# Patient Record
Sex: Female | Born: 1945 | Race: White | Hispanic: No | Smoking: Never smoker
Health system: Southern US, Community
[De-identification: ages and names within clinical notes are randomized; demographics above are authoritative.]

## PROBLEM LIST (undated history)

## (undated) DIAGNOSIS — K589 Irritable bowel syndrome without diarrhea: Secondary | ICD-10-CM

## (undated) DIAGNOSIS — E7800 Pure hypercholesterolemia, unspecified: Secondary | ICD-10-CM

## (undated) DIAGNOSIS — E119 Type 2 diabetes mellitus without complications: Secondary | ICD-10-CM

## (undated) DIAGNOSIS — G629 Polyneuropathy, unspecified: Secondary | ICD-10-CM

## (undated) DIAGNOSIS — D510 Vitamin B12 deficiency anemia due to intrinsic factor deficiency: Secondary | ICD-10-CM

## (undated) DIAGNOSIS — I10 Essential (primary) hypertension: Secondary | ICD-10-CM

## (undated) DIAGNOSIS — G5603 Carpal tunnel syndrome, bilateral upper limbs: Secondary | ICD-10-CM

## (undated) DIAGNOSIS — D649 Anemia, unspecified: Secondary | ICD-10-CM

## (undated) DIAGNOSIS — R0600 Dyspnea, unspecified: Secondary | ICD-10-CM

## (undated) DIAGNOSIS — K219 Gastro-esophageal reflux disease without esophagitis: Secondary | ICD-10-CM

## (undated) DIAGNOSIS — R42 Dizziness and giddiness: Secondary | ICD-10-CM

## (undated) DIAGNOSIS — M1990 Unspecified osteoarthritis, unspecified site: Secondary | ICD-10-CM

## (undated) DIAGNOSIS — E538 Deficiency of other specified B group vitamins: Secondary | ICD-10-CM

## (undated) DIAGNOSIS — K2970 Gastritis, unspecified, without bleeding: Secondary | ICD-10-CM

## (undated) HISTORY — DX: Essential (primary) hypertension: I10

## (undated) HISTORY — DX: Deficiency of other specified B group vitamins: E53.8

## (undated) HISTORY — DX: Type 2 diabetes mellitus without complications: E11.9

## (undated) HISTORY — DX: Pure hypercholesterolemia, unspecified: E78.00

## (undated) HISTORY — DX: Irritable bowel syndrome, unspecified: K58.9

## (undated) HISTORY — DX: Gastritis, unspecified, without bleeding: K29.70

## (undated) HISTORY — DX: Vitamin B12 deficiency anemia due to intrinsic factor deficiency: D51.0

## (undated) HISTORY — DX: Anemia, unspecified: D64.9

## (undated) HISTORY — DX: Unspecified osteoarthritis, unspecified site: M19.90

## (undated) HISTORY — DX: Gastro-esophageal reflux disease without esophagitis: K21.9

---

## 1978-04-08 HISTORY — PX: BREAST BIOPSY: SHX20

## 1978-04-08 HISTORY — PX: BREAST EXCISIONAL BIOPSY: SUR124

## 1978-04-08 HISTORY — PX: BREAST SURGERY: SHX581

## 1981-04-08 HISTORY — PX: CHOLECYSTECTOMY: SHX55

## 2004-03-06 ENCOUNTER — Ambulatory Visit: Payer: Self-pay | Admitting: Internal Medicine

## 2004-07-16 ENCOUNTER — Ambulatory Visit: Payer: Self-pay | Admitting: Internal Medicine

## 2004-08-21 ENCOUNTER — Ambulatory Visit: Payer: Self-pay | Admitting: Internal Medicine

## 2004-11-30 ENCOUNTER — Ambulatory Visit: Payer: Self-pay | Admitting: Internal Medicine

## 2007-03-31 ENCOUNTER — Observation Stay: Payer: Self-pay | Admitting: Internal Medicine

## 2007-03-31 ENCOUNTER — Other Ambulatory Visit: Payer: Self-pay

## 2007-11-29 ENCOUNTER — Ambulatory Visit: Payer: Self-pay | Admitting: Internal Medicine

## 2008-06-03 ENCOUNTER — Ambulatory Visit: Payer: Self-pay | Admitting: Internal Medicine

## 2008-09-23 ENCOUNTER — Ambulatory Visit: Payer: Self-pay | Admitting: Internal Medicine

## 2008-12-27 ENCOUNTER — Ambulatory Visit: Payer: Self-pay | Admitting: Internal Medicine

## 2009-07-22 ENCOUNTER — Ambulatory Visit: Payer: Self-pay | Admitting: Internal Medicine

## 2011-06-03 ENCOUNTER — Ambulatory Visit: Payer: Self-pay | Admitting: Internal Medicine

## 2011-06-06 ENCOUNTER — Emergency Department: Payer: Self-pay | Admitting: Emergency Medicine

## 2011-11-29 ENCOUNTER — Ambulatory Visit: Payer: Self-pay | Admitting: Gastroenterology

## 2012-02-17 ENCOUNTER — Ambulatory Visit (INDEPENDENT_AMBULATORY_CARE_PROVIDER_SITE_OTHER): Payer: Medicare Other | Admitting: Internal Medicine

## 2012-02-17 ENCOUNTER — Encounter: Payer: Self-pay | Admitting: Internal Medicine

## 2012-02-17 VITALS — BP 132/68 | HR 63 | Temp 98.7°F | Ht 62.0 in | Wt 231.8 lb

## 2012-02-17 DIAGNOSIS — E119 Type 2 diabetes mellitus without complications: Secondary | ICD-10-CM

## 2012-02-17 DIAGNOSIS — K589 Irritable bowel syndrome without diarrhea: Secondary | ICD-10-CM | POA: Insufficient documentation

## 2012-02-17 DIAGNOSIS — E785 Hyperlipidemia, unspecified: Secondary | ICD-10-CM | POA: Insufficient documentation

## 2012-02-17 DIAGNOSIS — E7800 Pure hypercholesterolemia, unspecified: Secondary | ICD-10-CM

## 2012-02-17 DIAGNOSIS — R51 Headache: Secondary | ICD-10-CM

## 2012-02-17 DIAGNOSIS — E538 Deficiency of other specified B group vitamins: Secondary | ICD-10-CM | POA: Insufficient documentation

## 2012-02-17 DIAGNOSIS — I10 Essential (primary) hypertension: Secondary | ICD-10-CM | POA: Insufficient documentation

## 2012-02-17 HISTORY — DX: Type 2 diabetes mellitus without complications: E11.9

## 2012-02-17 MED ORDER — PIOGLITAZONE HCL 30 MG PO TABS: 30.0000 mg | ORAL_TABLET | Freq: Every day | ORAL | Status: DC

## 2012-02-17 NOTE — Assessment & Plan Note (Signed)
Low cholesterol diet.  On simvastatin.  Check lipid panel and liver function with the next fasting labs.

## 2012-02-17 NOTE — Assessment & Plan Note (Signed)
Sugars as outlined.  Restart Actos.  Continue her insulin as outlined.  Needs to take her medications regularly.  Check met b and a1c.

## 2012-02-17 NOTE — Progress Notes (Signed)
  Subjective:    Patient ID: Beth Tyler, female    DOB: Jan 22, 1946, 66 y.o.   MRN: [id number]  HPI 66 year old female with past history of diabetes, hypertension and hypercholesterolemia who comes in today for a scheduled follow up.  States she is doing relatively well.  Sugars in the am averaging 100-120 and PM sugars 180.  Before bed - sugar - 200.  Out of her actos x 3 months.  She was also out of her Lantus for 6 weeks.  She has been back on Lantus for 3 weeks.  Working three jobs now.  Reports no increased stress.  States she enjoys her job.  Trying to watch what she eats.  Breathing stable.  Bowels unchanged.    Past Medical History  Diagnosis Date  . Diabetes mellitus, type II   . Hypertension   . Hypercholesterolemia   . GERD (gastroesophageal reflux disease)   . IBS (irritable bowel syndrome)   . Gastritis   . B12 deficiency   . Arthritis     Review of Systems Patient denies any headache, lightheadedness or dizziness.  No chest pain, tightness or palpitations.  No increased shortness of breath, cough or congestion.  No nausea or vomiting.  No abdominal pain or cramping.  No bowel change.  States her bowels are stable.  Just had her colonoscopy.  States everything looked good.  Recommended follow up is 10 years.  No urine change.        Objective:   Physical Exam Filed Vitals:   02/17/12 1413  BP: 132/68  Pulse: 63  Temp: 98.7 F (77.72 C)   66 year old female in no acute distress.   HEENT:  Nares - clear.  OP- without lesions or erythema.  NECK:  Supple, nontender.  No audible bruit.   HEART:  Appears to be regular. LUNGS:  Without crackles or wheezing audible.  Respirations even and unlabored.   RADIAL PULSE:  Equal bilaterally.  ABDOMEN:  Soft, nontender.  No audible abdominal bruit.   EXTREMITIES:  No increased edema to be present.  Lesion between  Her first and second toe - c/w yeast.  No increased erythema.                     Assessment & Plan:  TOE LESION.   Changes c/w fungus.  Treat with Lotrimin bid as directed.  Notify me if it worsens or does not resolve.    CARDIOVASCULAR.  Stress test 08/06/10 negative for ischemia.  Normal heart function.  Currently asymptomatic.    HEALTH MAINTENANCE.  Physical 08/13/10.  States she can only have a physical every two years.  Mammogram 06/03/11 - BiRADS I.  She is going to find out if we can schedule her mammogram. Will call and let me know if I can schedule.  Got her flu shot.

## 2012-02-17 NOTE — Assessment & Plan Note (Signed)
Blood pressure as outlined.  Same meds.  Check met b with next labs.

## 2012-02-17 NOTE — Patient Instructions (Addendum)
It was good seeing you today.  I want you to use the Lotrimin cream twice a day (between your toes).  Let me know if the area does not resolve.  We will schedule your follow up labs.  Schedule a B12 injection at the end of the month

## 2012-02-17 NOTE — Assessment & Plan Note (Signed)
Bowels stable.  Just had colonoscopy.  States everything checked out fine.  Recommended follow up in 10 years.

## 2012-02-17 NOTE — Assessment & Plan Note (Signed)
Continue B12 injections.  Due at the end of this month.

## 2012-02-21 ENCOUNTER — Other Ambulatory Visit (INDEPENDENT_AMBULATORY_CARE_PROVIDER_SITE_OTHER): Payer: Medicare Other

## 2012-02-21 DIAGNOSIS — R51 Headache: Secondary | ICD-10-CM

## 2012-02-21 DIAGNOSIS — E7800 Pure hypercholesterolemia, unspecified: Secondary | ICD-10-CM

## 2012-02-21 DIAGNOSIS — E119 Type 2 diabetes mellitus without complications: Secondary | ICD-10-CM

## 2012-02-21 LAB — BASIC METABOLIC PANEL
CO2: 28 mEq/L (ref 19–32)
Calcium: 9.2 mg/dL (ref 8.4–10.5)
Creatinine, Ser: 0.9 mg/dL (ref 0.4–1.2)
GFR: 67.16 mL/min (ref >60.00)
Sodium: 140 mEq/L (ref 135–145)

## 2012-02-21 LAB — LIPID PANEL
Cholesterol: 148 mg/dL (ref 0–200)
Total CHOL/HDL Ratio: 4

## 2012-02-21 LAB — HEMOGLOBIN A1C: Hgb A1c MFr Bld: 9.8 % — ABNORMAL HIGH (ref 4.6–6.5)

## 2012-02-21 LAB — HEPATIC FUNCTION PANEL
ALT: 32 U/L (ref 0–35)
AST: 26 U/L (ref 0–37)
Total Bilirubin: 0.7 mg/dL (ref 0.3–1.2)

## 2012-02-21 LAB — LDL CHOLESTEROL, DIRECT: Direct LDL: 74.4 mg/dL

## 2012-02-21 LAB — SEDIMENTATION RATE: Sed Rate: 14 mm/hr (ref 0–22)

## 2012-02-24 ENCOUNTER — Telehealth: Payer: Self-pay | Admitting: Internal Medicine

## 2012-02-24 NOTE — Telephone Encounter (Signed)
I sent Ms Figgers a My Chart message.  She needs an appt with me within the next 2-3 weeks - to follow up on her sugars.  Thanks.  Please call her with an appt.

## 2012-02-24 NOTE — Telephone Encounter (Signed)
Appointment 12/6/@ 9 pt aware of appointment

## 2012-02-26 ENCOUNTER — Telehealth: Payer: Self-pay | Admitting: Internal Medicine

## 2012-02-26 NOTE — Telephone Encounter (Signed)
[facility] Gly-buride mcr 6mg  tab ptrd qty 180 Sig  Take one tablet by mouth twice daily

## 2012-03-03 MED ORDER — GLYBURIDE MICRONIZED 6 MG PO TABS: 6.0000 mg | ORAL_TABLET | Freq: Two times a day (BID) | ORAL | Status: DC

## 2012-03-03 NOTE — Telephone Encounter (Signed)
Sent in to pharmacy.  

## 2012-03-08 ENCOUNTER — Telehealth: Payer: Self-pay | Admitting: Internal Medicine

## 2012-03-08 MED ORDER — METFORMIN HCL 1000 MG PO TABS: 1000.0000 mg | ORAL_TABLET | Freq: Two times a day (BID) | ORAL | Status: DC

## 2012-03-08 MED ORDER — HYDROCHLOROTHIAZIDE 25 MG PO TABS: 25.0000 mg | ORAL_TABLET | Freq: Every day | ORAL | Status: DC

## 2012-03-08 MED ORDER — LISINOPRIL 20 MG PO TABS: ORAL_TABLET | ORAL | Status: DC

## 2012-03-08 NOTE — Telephone Encounter (Signed)
rx sent in for metformin 1000 mg bid, lisinopril 20mg  (2 qam and one q pm) and hctz

## 2012-03-10 ENCOUNTER — Encounter: Payer: Self-pay | Admitting: Internal Medicine

## 2012-03-13 ENCOUNTER — Encounter: Payer: Self-pay | Admitting: Internal Medicine

## 2012-03-13 ENCOUNTER — Ambulatory Visit (INDEPENDENT_AMBULATORY_CARE_PROVIDER_SITE_OTHER): Payer: Medicare Other | Admitting: Internal Medicine

## 2012-03-13 VITALS — BP 122/78 | HR 69 | Temp 98.3°F | Ht 62.0 in | Wt 231.5 lb

## 2012-03-13 DIAGNOSIS — K589 Irritable bowel syndrome without diarrhea: Secondary | ICD-10-CM

## 2012-03-13 DIAGNOSIS — E538 Deficiency of other specified B group vitamins: Secondary | ICD-10-CM

## 2012-03-13 DIAGNOSIS — E7800 Pure hypercholesterolemia, unspecified: Secondary | ICD-10-CM

## 2012-03-13 DIAGNOSIS — I10 Essential (primary) hypertension: Secondary | ICD-10-CM

## 2012-03-13 DIAGNOSIS — E119 Type 2 diabetes mellitus without complications: Secondary | ICD-10-CM

## 2012-03-13 MED ORDER — CYANOCOBALAMIN 1000 MCG/ML IJ SOLN
1000.0000 ug | Freq: Once | INTRAMUSCULAR | Status: AC
  Administered 2012-03-13: 1000 ug via INTRAMUSCULAR

## 2012-03-13 NOTE — Progress Notes (Signed)
  Subjective:    Patient ID: Beth Tyler, female    DOB: 1945-11-24, 66 y.o.   MRN: [id number]  HPI 66 year old female with past history of diabetes, hypertension and hypercholesterolemia who comes in today for a scheduled follow up.  Declines physical today.  States she is doing relatively well.  Sugars in the am averaging 90-120 and PM sugars 180-230.  Reports no increased stress.  States she enjoys her job.  Trying to watch what she eats.  Not able to eat regular meals at regular times - secondary to her work scheduled.  Breathing stable.  Bowels unchanged.  She takes her [name] at lunch.    Past Medical History  Diagnosis Date  . Diabetes mellitus, type II   . Hypertension   . Hypercholesterolemia   . GERD (gastroesophageal reflux disease)   . IBS (irritable bowel syndrome)   . Gastritis   . B12 deficiency   . Arthritis   . Diabetes mellitus 02/17/2012  . Anemia   . Pernicious anemia     Review of Systems Patient denies any headache, lightheadedness or dizziness.  No significant sinus or allergy symptoms.  No chest pain, tightness or palpitations.  No increased shortness of breath, cough or congestion.  No nausea or vomiting.  No abdominal pain or cramping.  No bowel change.  States her bowels are stable.  No urine change.        Objective:   Physical Exam  Filed Vitals:   03/13/12 0901  BP: 122/78  Pulse: 69  Temp: 98.3 F (18.53 C)   66 year old female in no acute distress.   HEENT:  Nares - clear.  OP- without lesions or erythema.  NECK:  Supple, nontender.  No audible bruit.   HEART:  Appears to be regular. LUNGS:  Without crackles or wheezing audible.  Respirations even and unlabored.   RADIAL PULSE:  Equal bilaterally.  ABDOMEN:  Soft, nontender.  No audible abdominal bruit.   EXTREMITIES:  No increased edema to be present.                     Assessment & Plan:  TOE LESION.  Previous changes c/w fungus.  Treated with Lotrimin bid as directed.  Resolved.     CARDIOVASCULAR.  Stress test 08/06/10 negative for ischemia.  Normal heart function.  Currently asymptomatic.    HEALTH MAINTENANCE.  Physical 08/13/10.  States she can only have a physical every two years.  Mammogram 06/03/11 - BiRADS I.  She declines physical today.

## 2012-03-15 ENCOUNTER — Encounter: Payer: Self-pay | Admitting: Internal Medicine

## 2012-03-15 NOTE — Assessment & Plan Note (Signed)
On simvastatin.  Low cholesterol diet and exercise.  Check lipid panel and liver function.  

## 2012-03-15 NOTE — Assessment & Plan Note (Signed)
Symptoms stable.  Colonoscopy 2/03 normal colon.  EGD revealed gastritis with normal esophagus.  Follow.

## 2012-03-15 NOTE — Assessment & Plan Note (Signed)
Continue B12 injections.   

## 2012-03-15 NOTE — Assessment & Plan Note (Signed)
Blood pressure under good control.  Same meds.  Follow metabolic panel.   

## 2012-03-15 NOTE — Assessment & Plan Note (Signed)
Sugars as outlined.  Have improved since restarting Actos and Lantus.  Last a1c 9.8.  Restarted Actos and Lantus after this lab check.  We discussed at length today regarding eating regular meals and not going long periods without eating.  Will monitor sugars and record.  Will need to adjust medication pending sugar readings.  May need to adjust [name] dosing.

## 2012-04-09 ENCOUNTER — Ambulatory Visit (INDEPENDENT_AMBULATORY_CARE_PROVIDER_SITE_OTHER): Payer: Medicare Other | Admitting: Internal Medicine

## 2012-04-09 DIAGNOSIS — E538 Deficiency of other specified B group vitamins: Secondary | ICD-10-CM

## 2012-04-09 MED ORDER — CYANOCOBALAMIN 1000 MCG/ML IJ SOLN
1000.0000 ug | Freq: Once | INTRAMUSCULAR | Status: AC
Start: 2012-04-09 — End: 2012-04-09
  Administered 2012-04-09: 1000 ug via INTRAMUSCULAR

## 2012-04-15 ENCOUNTER — Other Ambulatory Visit: Payer: Self-pay | Admitting: *Deleted

## 2012-04-15 MED ORDER — LISINOPRIL 20 MG PO TABS: ORAL_TABLET | ORAL | Status: DC

## 2012-04-15 MED ORDER — SIMVASTATIN 10 MG PO TABS: 10.0000 mg | ORAL_TABLET | Freq: Every day | ORAL | Status: DC

## 2012-04-15 NOTE — Telephone Encounter (Signed)
Sent in to pharmacy.  

## 2012-05-21 ENCOUNTER — Telehealth: Payer: Self-pay | Admitting: Internal Medicine

## 2012-05-21 DIAGNOSIS — K589 Irritable bowel syndrome without diarrhea: Secondary | ICD-10-CM

## 2012-05-21 DIAGNOSIS — I10 Essential (primary) hypertension: Secondary | ICD-10-CM

## 2012-05-21 DIAGNOSIS — E119 Type 2 diabetes mellitus without complications: Secondary | ICD-10-CM

## 2012-05-21 DIAGNOSIS — D649 Anemia, unspecified: Secondary | ICD-10-CM

## 2012-05-21 DIAGNOSIS — E7800 Pure hypercholesterolemia, unspecified: Secondary | ICD-10-CM

## 2012-05-21 NOTE — Telephone Encounter (Signed)
When I called pt. To r/s appt from 2-14 with Dr. Lorin Picket, she asked could she come in a week before her appointment and have lab work prior to visit.  Please let me know and we can schedule lab appointment. Also she is due for B12 injection, i advised patient that it was on back order and we can call her once we have it.

## 2012-05-21 NOTE — Telephone Encounter (Signed)
Order placed for labs.  Please schedule labs 1-2 days before her 06/22/12 appt.  Thanks.

## 2012-05-22 ENCOUNTER — Ambulatory Visit: Payer: Medicare Other | Admitting: Internal Medicine

## 2012-05-25 NOTE — Telephone Encounter (Signed)
Left message for pt to call office.  Please schedule her labs 1-- days prior to appointment 3/17

## 2012-05-26 ENCOUNTER — Other Ambulatory Visit: Payer: Self-pay | Admitting: *Deleted

## 2012-05-26 MED ORDER — AMLODIPINE BESYLATE 10 MG PO TABS: 10.0000 mg | ORAL_TABLET | Freq: Every day | ORAL | Status: DC

## 2012-05-26 NOTE — Telephone Encounter (Signed)
Sent in to pharmacy.  

## 2012-06-01 NOTE — Telephone Encounter (Signed)
Lab appointment 3/14  Pt aware of appointment

## 2012-06-03 ENCOUNTER — Other Ambulatory Visit: Payer: Self-pay | Admitting: *Deleted

## 2012-06-04 MED ORDER — OMEPRAZOLE 20 MG PO CPDR: 20.0000 mg | DELAYED_RELEASE_CAPSULE | Freq: Two times a day (BID) | ORAL | Status: DC

## 2012-06-04 NOTE — Telephone Encounter (Signed)
Sent in to pharmacy.  

## 2012-06-05 ENCOUNTER — Telehealth: Payer: Self-pay | Admitting: Internal Medicine

## 2012-06-05 DIAGNOSIS — Z1239 Encounter for other screening for malignant neoplasm of breast: Secondary | ICD-10-CM

## 2012-06-05 NOTE — Telephone Encounter (Signed)
I received a request from the patient to schedule her mammogram.  Order placed.  She request march or April appt.

## 2012-06-19 ENCOUNTER — Other Ambulatory Visit (INDEPENDENT_AMBULATORY_CARE_PROVIDER_SITE_OTHER): Payer: Medicare Other

## 2012-06-19 DIAGNOSIS — D649 Anemia, unspecified: Secondary | ICD-10-CM

## 2012-06-19 DIAGNOSIS — E119 Type 2 diabetes mellitus without complications: Secondary | ICD-10-CM

## 2012-06-19 DIAGNOSIS — E7800 Pure hypercholesterolemia, unspecified: Secondary | ICD-10-CM

## 2012-06-19 LAB — CBC WITH DIFFERENTIAL/PLATELET
Basophils Absolute: 0 10*3/uL (ref 0.0–0.1)
Eosinophils Absolute: 0.5 10*3/uL (ref 0.0–0.7)
HCT: 36.6 % (ref 36.0–46.0)
Hemoglobin: 12.3 g/dL (ref 12.0–15.0)
Lymphs Abs: 2.7 10*3/uL (ref 0.7–4.0)
MCHC: 33.5 g/dL (ref 30.0–36.0)
Neutro Abs: 4.9 10*3/uL (ref 1.4–7.7)
Platelets: 229 10*3/uL (ref 150.0–400.0)
RDW: 13.9 % (ref 11.5–14.6)

## 2012-06-19 LAB — LIPID PANEL: Cholesterol: 110 mg/dL (ref 0–200)

## 2012-06-19 LAB — BASIC METABOLIC PANEL
BUN: 20 mg/dL (ref 6–23)
CO2: 27 mEq/L (ref 19–32)
Calcium: 9.2 mg/dL (ref 8.4–10.5)
GFR: 61.48 mL/min (ref >60.00)
Glucose, Bld: 75 mg/dL (ref 70–99)
Potassium: 5.1 mEq/L (ref 3.5–5.1)
Sodium: 140 mEq/L (ref 135–145)

## 2012-06-19 LAB — HEPATIC FUNCTION PANEL
ALT: 25 U/L (ref 0–35)
AST: 19 U/L (ref 0–37)
Alkaline Phosphatase: 64 U/L (ref 39–117)
Bilirubin, Direct: 0.1 mg/dL (ref 0.0–0.3)
Total Bilirubin: 0.5 mg/dL (ref 0.3–1.2)
Total Protein: 7.2 g/dL (ref 6.0–8.3)

## 2012-06-22 ENCOUNTER — Ambulatory Visit (INDEPENDENT_AMBULATORY_CARE_PROVIDER_SITE_OTHER): Payer: Medicare Other | Admitting: Internal Medicine

## 2012-06-22 ENCOUNTER — Encounter: Payer: Self-pay | Admitting: Internal Medicine

## 2012-06-22 VITALS — BP 120/60 | HR 65 | Temp 97.8°F | Ht 62.0 in | Wt 230.5 lb

## 2012-06-22 DIAGNOSIS — E538 Deficiency of other specified B group vitamins: Secondary | ICD-10-CM

## 2012-06-22 DIAGNOSIS — E119 Type 2 diabetes mellitus without complications: Secondary | ICD-10-CM

## 2012-06-22 DIAGNOSIS — E7800 Pure hypercholesterolemia, unspecified: Secondary | ICD-10-CM

## 2012-06-22 DIAGNOSIS — K589 Irritable bowel syndrome without diarrhea: Secondary | ICD-10-CM

## 2012-06-22 DIAGNOSIS — I10 Essential (primary) hypertension: Secondary | ICD-10-CM

## 2012-06-28 ENCOUNTER — Encounter: Payer: Self-pay | Admitting: Internal Medicine

## 2012-06-28 NOTE — Assessment & Plan Note (Signed)
Sugars as outlined.  Have improved since restarting Actos and Lantus.  Last a1c 7.6 down from 9.8.  Restarted Actos and Lantus after this lab check.  We discussed at length today regarding eating regular meals and not going long periods without eating.  Will monitor sugars and record.  Will need to adjust medication pending sugar readings.  May need to adjust [name] dosing.  Send in readings.  Brought in no readings today.  Follow.

## 2012-06-28 NOTE — Assessment & Plan Note (Addendum)
Bowels stable.  Just had colonoscopy.  States everything checked out fine.  Recommended follow up in 10 years.  Last colonoscopy 11/29/11.

## 2012-06-28 NOTE — Assessment & Plan Note (Signed)
Blood pressure under good control.  Same meds.  Follow metabolic panel.   

## 2012-06-28 NOTE — Assessment & Plan Note (Signed)
Continue B12 injections.   

## 2012-06-28 NOTE — Progress Notes (Signed)
Subjective:    Patient ID: Beth Tyler, female    DOB: [birth year], 67 y.o.   MRN: [id number]  HPI 67 year old female with past history of diabetes, hypertension and hypercholesterolemia who comes in today for a scheduled follow up.  States she is doing relatively well.  Brought in no recorded sugar readings.  Sugars in the am averaging 90-110.  Higher in the evening.   Reports no increased stress.  States she enjoys her job.  Trying to watch what she eats.  Not able to eat regular meals at regular times - secondary to her work schedule.  Breathing stable.  Bowels unchanged.  She takes her [name] at lunch.  Discussed the importance of eating regular meals.  No problem with lows.   Past Medical History  Diagnosis Date  . Diabetes mellitus, type II   . Hypertension   . Hypercholesterolemia   . GERD (gastroesophageal reflux disease)   . IBS (irritable bowel syndrome)   . Gastritis   . B12 deficiency   . Arthritis   . Diabetes mellitus 02/17/2012  . Anemia   . Pernicious anemia     Current Outpatient Prescriptions on File Prior to Visit  Medication Sig Dispense Refill  . amLODipine (NORVASC) 10 MG tablet Take 1 tablet (10 mg total) by mouth daily.  30 tablet  5  . aspirin 81 MG tablet Take 81 mg by mouth daily.      Marland Kitchen atenolol (TENORMIN) 100 MG tablet 100 mg. Take one tablet qam and 1/2 tablet qpm      . cyanocobalamin (,VITAMIN B-12,) 1000 MCG/ML injection Inject 1,000 mcg into the muscle every 30 (thirty) days.      Marland Kitchen glyBURIDE micronized (GLYNASE) 6 MG tablet Take 1 tablet (6 mg total) by mouth 2 (two) times daily before a meal.  60 tablet  1  . hydrochlorothiazide (HYDRODIURIL) 25 MG tablet Take 1 tablet (25 mg total) by mouth daily.  30 tablet  5  . insulin glargine (LANTUS) 100 UNIT/ML injection Inject 16 Units into the skin daily.      . insulin lispro ([name]) 100 UNIT/ML injection Inject 10 Units into the skin daily. Take 10 units before lunch      . lisinopril  (PRINIVIL,ZESTRIL) 20 MG tablet Take one tablet in the am and two tablets in the pm  270 tablet  0  . metFORMIN (GLUCOPHAGE) 1000 MG tablet Take 1 tablet (1,000 mg total) by mouth 2 (two) times daily with a meal.  180 tablet  3  . omeprazole (PRILOSEC) 20 MG capsule Take 1 capsule (20 mg total) by mouth 2 (two) times daily.  60 capsule  5  . pioglitazone (ACTOS) 30 MG tablet Take 1 tablet (30 mg total) by mouth daily.  30 tablet  6  . simvastatin (ZOCOR) 10 MG tablet Take 1 tablet (10 mg total) by mouth at bedtime.  90 tablet  0   No current facility-administered medications on file prior to visit.    Review of Systems Patient denies any headache, lightheadedness or dizziness.  No significant sinus or allergy symptoms.  No chest pain, tightness or palpitations.  No increased shortness of breath, cough or congestion.  No nausea or vomiting.  No abdominal pain or cramping.  No bowel change.  States her bowels are stable.  No urine change.        Objective:   Physical Exam  Filed Vitals:   06/22/12 1506  BP: 120/60  Pulse: 65  Temp:  97.8 F (16.38 C)   67 year old female in no acute distress.   HEENT:  Nares - clear.  OP- without lesions or erythema.  NECK:  Supple, nontender.  No audible bruit.   HEART:  Appears to be regular. LUNGS:  Without crackles or wheezing audible.  Respirations even and unlabored.   RADIAL PULSE:  Equal bilaterally.  ABDOMEN:  Soft, nontender.  No audible abdominal bruit.   EXTREMITIES:  No increased edema to be present.                     Assessment & Plan:  CARDIOVASCULAR.  Stress test 08/06/10 negative for ischemia.  Normal heart function.  Currently asymptomatic.    HEALTH MAINTENANCE.  Physical 08/13/10.  States she can only have a physical every two years.  Mammogram 06/03/11 - BiRADS I.  Need to schedule mammogram.  Schedule physical next visit.  08/10/10 pap negative.

## 2012-06-28 NOTE — Assessment & Plan Note (Signed)
On simvastatin.  Low cholesterol diet and exercise.  Follow lipid panel and liver function.  06/19/12 lipid panel revealed total cholesterol 110, triglycerides 113, HDL 43 and LDL 44.  Will change her statin to qod.  Follow.

## 2012-06-30 ENCOUNTER — Telehealth: Payer: Self-pay | Admitting: Internal Medicine

## 2012-06-30 MED ORDER — PIOGLITAZONE HCL 30 MG PO TABS: 30.0000 mg | ORAL_TABLET | Freq: Every day | ORAL | Status: DC

## 2012-06-30 MED ORDER — AMLODIPINE BESYLATE 10 MG PO TABS: 10.0000 mg | ORAL_TABLET | Freq: Every day | ORAL | Status: DC

## 2012-06-30 MED ORDER — GLYBURIDE MICRONIZED 6 MG PO TABS: 6.0000 mg | ORAL_TABLET | Freq: Two times a day (BID) | ORAL | Status: DC

## 2012-06-30 NOTE — Telephone Encounter (Signed)
Refilled glyburide #90 with 3 refills

## 2012-06-30 NOTE — Telephone Encounter (Signed)
Refilled actos and amlodipine #90 with 3 refills

## 2012-07-01 ENCOUNTER — Other Ambulatory Visit: Payer: Self-pay | Admitting: *Deleted

## 2012-07-01 MED ORDER — HYDROCHLOROTHIAZIDE 25 MG PO TABS: 25.0000 mg | ORAL_TABLET | Freq: Every day | ORAL | Status: DC

## 2012-07-01 NOTE — Telephone Encounter (Signed)
Rx sent in to pharmacy. 

## 2012-07-02 ENCOUNTER — Other Ambulatory Visit: Payer: Self-pay | Admitting: *Deleted

## 2012-07-03 MED ORDER — ATENOLOL 100 MG PO TABS: 100.0000 mg | ORAL_TABLET | Freq: Every day | ORAL | Status: DC

## 2012-07-03 NOTE — Telephone Encounter (Signed)
Med filled.  

## 2012-07-04 ENCOUNTER — Telehealth: Payer: Self-pay | Admitting: Internal Medicine

## 2012-07-04 MED ORDER — ATENOLOL 100 MG PO TABS: ORAL_TABLET | ORAL | Status: DC

## 2012-07-04 NOTE — Telephone Encounter (Signed)
Refilled atenolol 100mg  (one tablet in the am and 1/2 in the pm).  #45 with 6 refills.

## 2012-07-27 ENCOUNTER — Telehealth: Payer: Self-pay | Admitting: Internal Medicine

## 2012-07-27 NOTE — Telephone Encounter (Signed)
Patient called wanting a B-12 . Patient wants to know if there is anything else she can do?

## 2012-07-27 NOTE — Telephone Encounter (Signed)
Patient notified that we do have B12 in stock

## 2012-07-28 ENCOUNTER — Ambulatory Visit (INDEPENDENT_AMBULATORY_CARE_PROVIDER_SITE_OTHER): Payer: Medicare Other | Admitting: *Deleted

## 2012-07-28 DIAGNOSIS — R5381 Other malaise: Secondary | ICD-10-CM

## 2012-07-28 MED ORDER — CYANOCOBALAMIN 1000 MCG/ML IJ SOLN
1000.0000 ug | Freq: Once | INTRAMUSCULAR | Status: AC
  Administered 2012-07-28: 1000 ug via INTRAMUSCULAR

## 2012-08-07 ENCOUNTER — Other Ambulatory Visit: Payer: Self-pay | Admitting: *Deleted

## 2012-08-07 MED ORDER — ATENOLOL 100 MG PO TABS: ORAL_TABLET | ORAL | Status: DC

## 2012-08-19 ENCOUNTER — Other Ambulatory Visit: Payer: Self-pay | Admitting: *Deleted

## 2012-08-19 ENCOUNTER — Encounter: Payer: Self-pay | Admitting: Internal Medicine

## 2012-08-19 MED ORDER — SIMVASTATIN 10 MG PO TABS: 10.0000 mg | ORAL_TABLET | Freq: Every day | ORAL | Status: DC

## 2012-09-21 ENCOUNTER — Encounter: Payer: Medicare Other | Admitting: Internal Medicine

## 2012-09-29 ENCOUNTER — Ambulatory Visit (INDEPENDENT_AMBULATORY_CARE_PROVIDER_SITE_OTHER): Payer: Medicare Other | Admitting: *Deleted

## 2012-09-29 DIAGNOSIS — E538 Deficiency of other specified B group vitamins: Secondary | ICD-10-CM

## 2012-09-29 MED ORDER — CYANOCOBALAMIN 1000 MCG/ML IJ SOLN
1000.0000 ug | Freq: Once | INTRAMUSCULAR | Status: AC
  Administered 2012-09-29: 1000 ug via INTRAMUSCULAR

## 2012-10-14 ENCOUNTER — Encounter: Payer: Medicare Other | Admitting: Internal Medicine

## 2012-11-11 ENCOUNTER — Encounter: Payer: Self-pay | Admitting: *Deleted

## 2012-11-11 ENCOUNTER — Other Ambulatory Visit: Payer: Self-pay

## 2012-11-13 ENCOUNTER — Encounter: Payer: Self-pay | Admitting: Internal Medicine

## 2012-11-13 ENCOUNTER — Ambulatory Visit (INDEPENDENT_AMBULATORY_CARE_PROVIDER_SITE_OTHER): Payer: Medicare Other | Admitting: Internal Medicine

## 2012-11-13 VITALS — BP 124/60 | HR 62 | Temp 98.5°F | Ht 61.25 in | Wt 236.0 lb

## 2012-11-13 DIAGNOSIS — K589 Irritable bowel syndrome without diarrhea: Secondary | ICD-10-CM

## 2012-11-13 DIAGNOSIS — E119 Type 2 diabetes mellitus without complications: Secondary | ICD-10-CM

## 2012-11-13 DIAGNOSIS — I10 Essential (primary) hypertension: Secondary | ICD-10-CM

## 2012-11-13 DIAGNOSIS — E7800 Pure hypercholesterolemia, unspecified: Secondary | ICD-10-CM

## 2012-11-13 DIAGNOSIS — E538 Deficiency of other specified B group vitamins: Secondary | ICD-10-CM

## 2012-11-13 MED ORDER — CYANOCOBALAMIN 1000 MCG/ML IJ SOLN
1000.0000 ug | Freq: Once | INTRAMUSCULAR | Status: AC
  Administered 2012-11-13: 1000 ug via INTRAMUSCULAR

## 2012-11-13 MED ORDER — FLUTICASONE PROPIONATE 0.05 % EX CREA: 1.0000 "application " | TOPICAL_CREAM | CUTANEOUS | Status: DC | PRN

## 2012-11-15 ENCOUNTER — Encounter: Payer: Self-pay | Admitting: Internal Medicine

## 2012-11-15 NOTE — Assessment & Plan Note (Signed)
On simvastatin.  Low cholesterol diet and exercise.  Follow lipid panel and liver function.  06/19/12 lipid panel revealed total cholesterol 110, triglycerides 113, HDL 43 and LDL 44.  She is taking her statin qod now.  Follow.

## 2012-11-15 NOTE — Assessment & Plan Note (Signed)
Blood pressure under good control.  Same meds.  Follow metabolic panel.   

## 2012-11-15 NOTE — Assessment & Plan Note (Signed)
Bowels stable.  Just had colonoscopy.  States everything checked out fine.  Recommended follow up in 10 years.  Last colonoscopy 11/29/11.

## 2012-11-15 NOTE — Assessment & Plan Note (Signed)
Sugars as outlined.  Have improved since restarting Actos and Lantus.  Last a1c 7.6 down from 9.8.  Restarted Actos and Lantus after this lab check.  We discussed at length today regarding eating regular meals and not going long periods without eating.  Will monitor sugars and record.  Will need to adjust medication pending sugar readings.  May need to adjust [name] dosing.  Follow.  Checked metabolic panel and a1c.  Is up to date with eye checks.

## 2012-11-15 NOTE — Assessment & Plan Note (Signed)
Continue B12 injections.   

## 2012-11-15 NOTE — Progress Notes (Signed)
Subjective:    Patient ID: Beth Tyler, female    DOB: Jun 02, 1945, 67 y.o.   MRN: [id number]  HPI 67 year old female with past history of diabetes, hypertension and hypercholesterolemia who comes in today to follow up on these issues as well as for a complete physical exam.   States she is doing relatively well.  Brought in a few recorded sugar readings.  Sugars in the am averaging 80-120.  Higher in the evening.  PM sugars averaging 165-245.   States she enjoys her job.  Had been trying to watch what she eats, but recently has not been eating as well.  Her daughter-n-law had a stroke at age 64.  Has been back and forth to the hospital daily.  Eating a lot of quick, fast foods.  Not able to eat regular meals at regular times - secondary to her work schedule.  Breathing stable.  Bowels unchanged.  Discussed the importance of trying to eat regular meals.  No problem with lows.  Overall she feels she is doing relatively well.    Past Medical History  Diagnosis Date  . Diabetes mellitus, type II   . Hypertension   . Hypercholesterolemia   . GERD (gastroesophageal reflux disease)   . IBS (irritable bowel syndrome)   . Gastritis   . B12 deficiency   . Arthritis   . Diabetes mellitus 02/17/2012  . Anemia   . Pernicious anemia     Current Outpatient Prescriptions on File Prior to Visit  Medication Sig Dispense Refill  . amLODipine (NORVASC) 10 MG tablet Take 1 tablet (10 mg total) by mouth daily.  90 tablet  3  . aspirin 81 MG tablet Take 81 mg by mouth daily.      Marland Kitchen atenolol (TENORMIN) 100 MG tablet Take one tablet qam and 1/2 tablet qpm  135 tablet  2  . cyanocobalamin (,VITAMIN B-12,) 1000 MCG/ML injection Inject 1,000 mcg into the muscle every 30 (thirty) days.      Marland Kitchen glyBURIDE micronized (GLYNASE) 6 MG tablet Take 1 tablet (6 mg total) by mouth 2 (two) times daily before a meal.  180 tablet  3  . hydrochlorothiazide (HYDRODIURIL) 25 MG tablet Take 1 tablet (25 mg total) by mouth daily.   30 tablet  5  . insulin glargine (LANTUS) 100 UNIT/ML injection Inject 16 Units into the skin daily.      . insulin lispro ([name]) 100 UNIT/ML injection Inject 10 Units into the skin daily. Take 10 units before lunch      . lisinopril (PRINIVIL,ZESTRIL) 20 MG tablet Take one tablet in the am and two tablets in the pm  270 tablet  0  . metFORMIN (GLUCOPHAGE) 1000 MG tablet Take 1 tablet (1,000 mg total) by mouth 2 (two) times daily with a meal.  180 tablet  3  . omeprazole (PRILOSEC) 20 MG capsule Take 1 capsule (20 mg total) by mouth 2 (two) times daily.  60 capsule  5  . pioglitazone (ACTOS) 30 MG tablet Take 1 tablet (30 mg total) by mouth daily.  90 tablet  3  . simvastatin (ZOCOR) 10 MG tablet Take 1 tablet (10 mg total) by mouth at bedtime.  90 tablet  1   No current facility-administered medications on file prior to visit.    Review of Systems Patient denies any headache, lightheadedness or dizziness.  No significant sinus or allergy symptoms.  No chest pain, tightness or palpitations.  No increased shortness of breath, cough or congestion.  No nausea or vomiting. No acid  No abdominal pain or cramping.  No bowel change.  States her bowels are stable.  No urine change.   Overall she feels she is handling things relatively well.      Objective:   Physical Exam  Filed Vitals:   11/13/12 1331  BP: 124/60  Pulse: 62  Temp: 98.5 F (105.31 C)   67 year old female in no acute distress.   HEENT:  Nares- clear.  Oropharynx - without lesions. NECK:  Supple.  Nontender.  No audible bruit.  HEART:  Appears to be regular. LUNGS:  No crackles or wheezing audible.  Respirations even and unlabored.  RADIAL PULSE:  Equal bilaterally.    BREASTS:  No nipple discharge or nipple retraction present.  Could not appreciate any distinct nodules or axillary adenopathy.  ABDOMEN:  Soft, nontender.  Bowel sounds present and normal.  No audible abdominal bruit.  GU:  Not performed.     EXTREMITIES:  No  increased edema present.  DP pulses palpable and equal bilaterally.                   Assessment & Plan:  CARDIOVASCULAR.  Stress test 08/06/10 negative for ischemia.  Normal heart function.  Currently asymptomatic.    HEALTH MAINTENANCE.  Physical today.   Mammogram 06/03/11 - BiRADS I.  Information given for her to schedule.  08/10/10 pap negative.

## 2012-11-23 NOTE — Telephone Encounter (Signed)
Mailed unread message to pt  

## 2012-11-27 ENCOUNTER — Encounter: Payer: Medicare Other | Admitting: Internal Medicine

## 2012-11-29 ENCOUNTER — Other Ambulatory Visit: Payer: Self-pay | Admitting: Internal Medicine

## 2012-12-18 ENCOUNTER — Ambulatory Visit: Payer: Medicare Other

## 2012-12-18 ENCOUNTER — Other Ambulatory Visit: Payer: Medicare Other

## 2012-12-25 ENCOUNTER — Other Ambulatory Visit (INDEPENDENT_AMBULATORY_CARE_PROVIDER_SITE_OTHER): Payer: Medicare Other

## 2012-12-25 ENCOUNTER — Ambulatory Visit (INDEPENDENT_AMBULATORY_CARE_PROVIDER_SITE_OTHER): Payer: Medicare Other | Admitting: *Deleted

## 2012-12-25 DIAGNOSIS — E7800 Pure hypercholesterolemia, unspecified: Secondary | ICD-10-CM

## 2012-12-25 DIAGNOSIS — E119 Type 2 diabetes mellitus without complications: Secondary | ICD-10-CM

## 2012-12-25 DIAGNOSIS — I10 Essential (primary) hypertension: Secondary | ICD-10-CM

## 2012-12-25 DIAGNOSIS — E538 Deficiency of other specified B group vitamins: Secondary | ICD-10-CM

## 2012-12-25 LAB — BASIC METABOLIC PANEL
BUN: 20 mg/dL (ref 6–23)
Chloride: 105 mEq/L (ref 96–112)
Creatinine, Ser: 0.8 mg/dL (ref 0.4–1.2)
Glucose, Bld: 169 mg/dL — ABNORMAL HIGH (ref 70–99)
Potassium: 5.4 mEq/L — ABNORMAL HIGH (ref 3.5–5.1)

## 2012-12-25 LAB — LIPID PANEL
LDL Cholesterol: 72 mg/dL (ref 0–99)
VLDL: 34.6 mg/dL (ref 0.0–40.0)

## 2012-12-25 LAB — HEPATIC FUNCTION PANEL
Alkaline Phosphatase: 67 U/L (ref 39–117)
Bilirubin, Direct: 0.1 mg/dL (ref 0.0–0.3)
Total Bilirubin: 0.6 mg/dL (ref 0.3–1.2)

## 2012-12-25 LAB — TSH: TSH: 1.8 u[IU]/mL (ref 0.35–5.50)

## 2012-12-25 MED ORDER — CYANOCOBALAMIN 1000 MCG/ML IJ SOLN
1000.0000 ug | Freq: Once | INTRAMUSCULAR | Status: AC
  Administered 2012-12-25: 1000 ug via INTRAMUSCULAR

## 2012-12-27 ENCOUNTER — Encounter: Payer: Self-pay | Admitting: Internal Medicine

## 2012-12-29 ENCOUNTER — Telehealth: Payer: Self-pay | Admitting: *Deleted

## 2012-12-29 NOTE — Telephone Encounter (Signed)
rx written and placed in your box.  

## 2012-12-29 NOTE — Telephone Encounter (Signed)
I contacted pt to go over here lab results since her mychart message was unread. She would like to have her Potassium checked tomorrow at [facility]. I informed pt that I would fax over a Rx

## 2012-12-30 NOTE — Telephone Encounter (Signed)
Rx faxed to [facility]

## 2013-01-08 ENCOUNTER — Ambulatory Visit: Payer: Self-pay | Admitting: Internal Medicine

## 2013-01-22 ENCOUNTER — Ambulatory Visit (INDEPENDENT_AMBULATORY_CARE_PROVIDER_SITE_OTHER): Payer: Medicare Other

## 2013-01-22 DIAGNOSIS — Z23 Encounter for immunization: Secondary | ICD-10-CM

## 2013-01-23 ENCOUNTER — Encounter: Payer: Self-pay | Admitting: Internal Medicine

## 2013-01-30 ENCOUNTER — Other Ambulatory Visit: Payer: Self-pay | Admitting: Internal Medicine

## 2013-02-19 ENCOUNTER — Ambulatory Visit (INDEPENDENT_AMBULATORY_CARE_PROVIDER_SITE_OTHER): Payer: Medicare Other | Admitting: Adult Health

## 2013-02-19 ENCOUNTER — Encounter: Payer: Self-pay | Admitting: Adult Health

## 2013-02-19 VITALS — BP 128/70 | HR 72 | Temp 99.0°F | Resp 16 | Ht 62.0 in | Wt 230.0 lb

## 2013-02-19 DIAGNOSIS — J029 Acute pharyngitis, unspecified: Secondary | ICD-10-CM

## 2013-02-19 DIAGNOSIS — E538 Deficiency of other specified B group vitamins: Secondary | ICD-10-CM

## 2013-02-19 DIAGNOSIS — R52 Pain, unspecified: Secondary | ICD-10-CM

## 2013-02-19 LAB — POCT INFLUENZA A/B: Influenza A, POC: NEGATIVE

## 2013-02-19 MED ORDER — CYANOCOBALAMIN 1000 MCG/ML IJ SOLN
1000.0000 ug | Freq: Once | INTRAMUSCULAR | Status: AC
  Administered 2013-02-19: 1000 ug via INTRAMUSCULAR

## 2013-02-19 MED ORDER — AZITHROMYCIN 250 MG PO TABS: ORAL_TABLET | ORAL | Status: DC

## 2013-02-19 NOTE — Patient Instructions (Signed)
Start the azithromycin today.  Sore Throat A sore throat is pain, burning, irritation, or scratchiness of the throat. There is often pain or tenderness when swallowing or talking. A sore throat may be accompanied by other symptoms, such as coughing, sneezing, fever, and swollen neck glands. A sore throat is often the first sign of another sickness, such as a cold, flu, strep throat, or mononucleosis (commonly known as mono). Most sore throats go away without medical treatment. CAUSES  The most common causes of a sore throat include:  A viral infection, such as a cold, flu, or mono.  A bacterial infection, such as strep throat, tonsillitis, or whooping cough.  Seasonal allergies.  Dryness in the air.  Irritants, such as smoke or pollution.  Gastroesophageal reflux disease (GERD). HOME CARE INSTRUCTIONS   Only take over-the-counter medicines as directed by your caregiver.  Drink enough fluids to keep your urine clear or pale yellow.  Rest as needed.  Try using throat sprays, lozenges, or sucking on hard candy to ease any pain (if older than 4 years or as directed).  Sip warm liquids, such as broth, herbal tea, or warm water with honey to relieve pain temporarily. You may also eat or drink cold or frozen liquids such as frozen ice pops.  Gargle with salt water (mix 1 tsp salt with 8 oz of water).  Do not smoke and avoid secondhand smoke.  Put a cool-mist humidifier in your bedroom at night to moisten the air. You can also turn on a hot shower and sit in the bathroom with the door closed for 5 10 minutes. SEEK IMMEDIATE MEDICAL CARE IF:  You have difficulty breathing.  You are unable to swallow fluids, soft foods, or your saliva.  You have increased swelling in the throat.  Your sore throat does not get better in 7 days.  You have nausea and vomiting.  You have a fever or persistent symptoms for more than 2 3 days.  You have a fever and your symptoms suddenly get  worse. MAKE SURE YOU:   Understand these instructions.  Will watch your condition.  Will get help right away if you are not doing well or get worse. Document Released: 05/02/2004 Document Revised: 03/11/2012 Document Reviewed: 12/01/2011 [facility] Patient Information 2014 [place], [place].

## 2013-02-19 NOTE — Assessment & Plan Note (Signed)
Negative for flu. Start Azithromycin. Supportive care. Instruction given

## 2013-02-19 NOTE — Progress Notes (Signed)
Subjective:    Patient ID: Beth Tyler, female    DOB: [birth year], 67 y.o.   MRN: [id number]  HPI  Patient presents with sore throat especially with swallowing. Post nasal drip. Rhinorrhea. She has been running a fever. This morning it is 99 but she has been taking tylenol for aches and pains so probably lowered temp as well.  Patient is also here for her B12 injection.  Current Outpatient Prescriptions on File Prior to Visit  Medication Sig Dispense Refill  . amLODipine (NORVASC) 10 MG tablet Take 1 tablet (10 mg total) by mouth daily.  90 tablet  3  . aspirin 81 MG tablet Take 81 mg by mouth daily.      Marland Kitchen atenolol (TENORMIN) 100 MG tablet Take one tablet qam and 1/2 tablet qpm  135 tablet  2  . cyanocobalamin (,VITAMIN B-12,) 1000 MCG/ML injection Inject 1,000 mcg into the muscle every 30 (thirty) days.      . fluticasone (CUTIVATE) 0.05 % cream Apply 1 application topically as needed.  30 g  0  . glyBURIDE micronized (GLYNASE) 6 MG tablet Take 1 tablet (6 mg total) by mouth 2 (two) times daily before a meal.  180 tablet  3  . hydrochlorothiazide (HYDRODIURIL) 25 MG tablet Take 1 tablet (25 mg total) by mouth daily.  30 tablet  5  . insulin glargine (LANTUS) 100 UNIT/ML injection Inject 16 Units into the skin daily.      . insulin lispro ([name]) 100 UNIT/ML injection Inject 10 Units into the skin daily. Take 10 units before lunch      . lisinopril (PRINIVIL,ZESTRIL) 20 MG tablet Take one tablet in the am and two tablets in the pm  270 tablet  0  . lisinopril (PRINIVIL,ZESTRIL) 20 MG tablet TAKE TWO TABLETS BY MOUTH IN THE MORNING AND ONE IN THE EVENING  90 tablet  5  . metFORMIN (GLUCOPHAGE) 1000 MG tablet Take 1 tablet (1,000 mg total) by mouth 2 (two) times daily with a meal.  180 tablet  3  . omeprazole (PRILOSEC) 20 MG capsule TAKE ONE CAPSULE BY MOUTH TWICE DAILY  60 capsule  5  . pioglitazone (ACTOS) 30 MG tablet Take 1 tablet (30 mg total) by mouth daily.  90 tablet  3  .  simvastatin (ZOCOR) 10 MG tablet Take 1 tablet (10 mg total) by mouth at bedtime.  90 tablet  1   No current facility-administered medications on file prior to visit.    Review of Systems  Constitutional: Positive for fever and fatigue. Negative for chills.  HENT: Positive for sore throat. Negative for sinus pressure.   Respiratory: Positive for cough. Negative for shortness of breath and stridor.   Musculoskeletal:       Aches from being sick       Objective:   Physical Exam  Constitutional: She is oriented to person, place, and time. No distress.  HENT:  Head: Normocephalic and atraumatic.  Throat irritated and red  Cardiovascular: Normal rate, regular rhythm and normal heart sounds.  Exam reveals no gallop.   No murmur heard. Pulmonary/Chest: Effort normal and breath sounds normal. No respiratory distress. She has no wheezes. She has no rales.  Lymphadenopathy:    She has no cervical adenopathy.  Neurological: She is alert and oriented to person, place, and time.  Psychiatric: She has a normal mood and affect. Her behavior is normal. Judgment and thought content normal.    BP 128/70  Pulse 72  Temp(Src) 99  F (37.2 C) (Oral)  Resp 16  Ht 5\' 2"  (1.575 m)  Wt 230 lb (104.327 kg)  BMI 42.06 kg/m2  SpO2 97%       Assessment & Plan:

## 2013-02-19 NOTE — Assessment & Plan Note (Signed)
B12 injection given during clinic visit.

## 2013-03-15 ENCOUNTER — Other Ambulatory Visit (INDEPENDENT_AMBULATORY_CARE_PROVIDER_SITE_OTHER): Payer: Medicare Other

## 2013-03-15 ENCOUNTER — Telehealth: Payer: Self-pay | Admitting: *Deleted

## 2013-03-15 DIAGNOSIS — E7800 Pure hypercholesterolemia, unspecified: Secondary | ICD-10-CM

## 2013-03-15 DIAGNOSIS — E119 Type 2 diabetes mellitus without complications: Secondary | ICD-10-CM

## 2013-03-15 DIAGNOSIS — I10 Essential (primary) hypertension: Secondary | ICD-10-CM

## 2013-03-15 LAB — BASIC METABOLIC PANEL
BUN: 22 mg/dL (ref 6–23)
CO2: 27 mEq/L (ref 19–32)
Calcium: 9.6 mg/dL (ref 8.4–10.5)
Creatinine, Ser: 0.9 mg/dL (ref 0.4–1.2)
GFR: 64.43 mL/min (ref >60.00)
Glucose, Bld: 108 mg/dL — ABNORMAL HIGH (ref 70–99)
Sodium: 142 mEq/L (ref 135–145)

## 2013-03-15 LAB — HEPATIC FUNCTION PANEL
Alkaline Phosphatase: 68 U/L (ref 39–117)
Bilirubin, Direct: 0.1 mg/dL (ref 0.0–0.3)
Total Protein: 7.1 g/dL (ref 6.0–8.3)

## 2013-03-15 LAB — HEMOGLOBIN A1C: Hgb A1c MFr Bld: 9.5 % — ABNORMAL HIGH (ref 4.6–6.5)

## 2013-03-15 LAB — LIPID PANEL
Cholesterol: 161 mg/dL (ref 0–200)
Total CHOL/HDL Ratio: 4
VLDL: 25.2 mg/dL (ref 0.0–40.0)

## 2013-03-15 NOTE — Telephone Encounter (Signed)
Orders placed for labs.  Thanks.

## 2013-03-15 NOTE — Telephone Encounter (Signed)
What labs and dx?  

## 2013-03-17 ENCOUNTER — Other Ambulatory Visit: Payer: Self-pay | Admitting: Internal Medicine

## 2013-03-17 DIAGNOSIS — E119 Type 2 diabetes mellitus without complications: Secondary | ICD-10-CM

## 2013-03-17 NOTE — Progress Notes (Signed)
Order placed for endocrinology referral.  

## 2013-03-18 ENCOUNTER — Telehealth: Payer: Self-pay | Admitting: Internal Medicine

## 2013-03-18 NOTE — Telephone Encounter (Signed)
At time of appt reminder, pt states lab still has not received lab order.  Pt has been unable to have potassium checked.  Pt did confirm appt 12/12.

## 2013-03-18 NOTE — Telephone Encounter (Signed)
I refaxed order-not sure why they did not receive it this morning. Nobody from their office called to notify us either

## 2013-03-19 ENCOUNTER — Ambulatory Visit (INDEPENDENT_AMBULATORY_CARE_PROVIDER_SITE_OTHER): Payer: Medicare Other | Admitting: Internal Medicine

## 2013-03-19 ENCOUNTER — Encounter: Payer: Self-pay | Admitting: Internal Medicine

## 2013-03-19 VITALS — BP 122/60 | HR 67 | Temp 97.9°F | Ht 62.0 in | Wt 230.5 lb

## 2013-03-19 DIAGNOSIS — E875 Hyperkalemia: Secondary | ICD-10-CM

## 2013-03-19 DIAGNOSIS — E538 Deficiency of other specified B group vitamins: Secondary | ICD-10-CM

## 2013-03-19 DIAGNOSIS — E119 Type 2 diabetes mellitus without complications: Secondary | ICD-10-CM

## 2013-03-19 DIAGNOSIS — E7800 Pure hypercholesterolemia, unspecified: Secondary | ICD-10-CM

## 2013-03-19 DIAGNOSIS — I10 Essential (primary) hypertension: Secondary | ICD-10-CM

## 2013-03-19 DIAGNOSIS — K589 Irritable bowel syndrome without diarrhea: Secondary | ICD-10-CM

## 2013-03-19 NOTE — Progress Notes (Signed)
Subjective:    Patient ID: Beth Tyler, female    DOB: [birth year], 67 y.o.   MRN: [id number]  HPI 67 year old female with past history of diabetes, hypertension and hypercholesterolemia who comes in today for a scheduled follow up.  Brought in a few recorded sugar readings.  Sugars in the am averaging 98-122.  PM sugars averaging 160-190.   Better now.  She has been under increased stress recently.  Her husband has been in the hospital for the last several months.  Her daughter was diagnosed with breast cancer.  Her son had an MI.  Her daughter-n-law had a stroke.  Things are just now starting to settle back down.  She was not eating right and not taking her medicaitons regularly.  Skipping doses.  Eating a lot of quick, fast foods.  Not able to eat regular meals at regular times - secondary to her work schedule.  Breathing stable.  Bowels unchanged.  Overall she feels she is doing better.  Sugars improved.     Past Medical History  Diagnosis Date  . Diabetes mellitus, type II   . Hypertension   . Hypercholesterolemia   . GERD (gastroesophageal reflux disease)   . IBS (irritable bowel syndrome)   . Gastritis   . B12 deficiency   . Arthritis   . Diabetes mellitus 02/17/2012  . Anemia   . Pernicious anemia     Current Outpatient Prescriptions on File Prior to Visit  Medication Sig Dispense Refill  . amLODipine (NORVASC) 10 MG tablet Take 1 tablet (10 mg total) by mouth daily.  90 tablet  3  . aspirin 81 MG tablet Take 81 mg by mouth daily.      Marland Kitchen atenolol (TENORMIN) 100 MG tablet Take one tablet qam and 1/2 tablet qpm  135 tablet  2  . cyanocobalamin (,VITAMIN B-12,) 1000 MCG/ML injection Inject 1,000 mcg into the muscle every 30 (thirty) days.      . fluticasone (CUTIVATE) 0.05 % cream Apply 1 application topically as needed.  30 g  0  . glyBURIDE micronized (GLYNASE) 6 MG tablet Take 1 tablet (6 mg total) by mouth 2 (two) times daily before a meal.  180 tablet  3  .  hydrochlorothiazide (HYDRODIURIL) 25 MG tablet Take 1 tablet (25 mg total) by mouth daily.  30 tablet  5  . insulin glargine (LANTUS) 100 UNIT/ML injection Inject 16 Units into the skin daily.      . insulin lispro ([name]) 100 UNIT/ML injection Inject 10 Units into the skin daily. Take 10 units before lunch      . lisinopril (PRINIVIL,ZESTRIL) 20 MG tablet Take one tablet in the am and two tablets in the pm  270 tablet  0  . metFORMIN (GLUCOPHAGE) 1000 MG tablet Take 1 tablet (1,000 mg total) by mouth 2 (two) times daily with a meal.  180 tablet  3  . omeprazole (PRILOSEC) 20 MG capsule TAKE ONE CAPSULE BY MOUTH TWICE DAILY  60 capsule  5  . pioglitazone (ACTOS) 30 MG tablet Take 1 tablet (30 mg total) by mouth daily.  90 tablet  3  . simvastatin (ZOCOR) 10 MG tablet Take 1 tablet (10 mg total) by mouth at bedtime.  90 tablet  1   No current facility-administered medications on file prior to visit.    Review of Systems Patient denies any headache, lightheadedness or dizziness.  No significant sinus or allergy symptoms.  No chest pain, tightness or palpitations.  No increased  shortness of breath, cough or congestion.  No nausea or vomiting. No acid reflux. No abdominal pain or cramping.  No bowel change.  States her bowels are stable.  No urine change.   Overall she feels she is handling things relatively well.  Sugars improved now.  Getting back on track with her eating.       Objective:   Physical Exam  Filed Vitals:   03/19/13 0937  BP: 122/60  Pulse: 67  Temp: 97.9 F (28.72 C)   67 year old female in no acute distress.   HEENT:  Nares- clear.  Oropharynx - without lesions. NECK:  Supple.  Nontender.  No audible bruit.  HEART:  Appears to be regular. LUNGS:  No crackles or wheezing audible.  Respirations even and unlabored.  RADIAL PULSE:  Equal bilaterally.  ABDOMEN:  Soft, nontender.  Bowel sounds present and normal.  No audible abdominal bruit.     EXTREMITIES:  No increased  edema present.  DP pulses palpable and equal bilaterally.                   Assessment & Plan:  CARDIOVASCULAR.  Stress test 08/06/10 negative for ischemia.  Normal heart function.  Currently asymptomatic.    HEALTH MAINTENANCE.  Physical8/8/14.    Mammogram 01/08/13 - negative.  08/10/10 pap negative.

## 2013-03-19 NOTE — Progress Notes (Signed)
Pre-visit discussion using our clinic review tool. No additional management support is needed unless otherwise documented below in the visit note.  

## 2013-03-21 ENCOUNTER — Other Ambulatory Visit: Payer: Self-pay | Admitting: Internal Medicine

## 2013-03-21 ENCOUNTER — Encounter: Payer: Self-pay | Admitting: Internal Medicine

## 2013-03-21 DIAGNOSIS — E875 Hyperkalemia: Secondary | ICD-10-CM | POA: Insufficient documentation

## 2013-03-21 NOTE — Assessment & Plan Note (Signed)
Bowels stable.  Up to date with colonoscopy.  States everything checked out fine.  Recommended follow up in 10 years.  Last colonoscopy 11/29/11.    

## 2013-03-21 NOTE — Assessment & Plan Note (Signed)
Sugars as outlined.  Have improved recently.   Last a1c 9.5. We discussed at length today regarding eating regular meals and not going long periods without eating.  Is now on more of a regular schedule with eating.  Eating better.  Taking her medications regularly now.  Will hold on adjusting medications at this time.  Will monitor sugars and record.  Follow metabolic panel and a1c.  Is up to date with eye checks.

## 2013-03-21 NOTE — Assessment & Plan Note (Signed)
Recheck potassium today. 

## 2013-03-21 NOTE — Assessment & Plan Note (Signed)
Blood pressure under good control.  Same meds.  Follow metabolic panel.   

## 2013-03-21 NOTE — Assessment & Plan Note (Signed)
On simvastatin.  Low cholesterol diet and exercise.  Follow lipid panel and liver function.  12/8//14 lipid panel revealed total cholesterol 161, triglycerides 126, HDL 42 and LDL 93.  She is taking her statin qod now.  Follow.    

## 2013-03-21 NOTE — Assessment & Plan Note (Signed)
Continue B12 injections.   

## 2013-03-22 NOTE — Telephone Encounter (Signed)
Refill

## 2013-03-22 NOTE — Telephone Encounter (Signed)
Mailed unread message to pt  

## 2013-03-22 NOTE — Telephone Encounter (Signed)
Please confirm with pt how often she uses this and what she needs this for.  Thanks

## 2013-03-22 NOTE — Telephone Encounter (Signed)
Sent pt mychart message

## 2013-03-24 MED ORDER — HYDROCHLOROTHIAZIDE 25 MG PO TABS: 25.0000 mg | ORAL_TABLET | Freq: Every day | ORAL | Status: DC

## 2013-03-24 NOTE — Telephone Encounter (Signed)
States she uses the cream as needed, once daily x 5 days, off x 2 days. Some weeks does not need it. Applies to chin, nose, eyebrows, on edge and behind ears. Uses it for "some type of" dermatitis. Originally given by dermatologist, has been using it "for years"

## 2013-03-24 NOTE — Telephone Encounter (Signed)
Refilled x 1 with no refills.

## 2013-03-26 ENCOUNTER — Ambulatory Visit (INDEPENDENT_AMBULATORY_CARE_PROVIDER_SITE_OTHER): Payer: Medicare Other | Admitting: *Deleted

## 2013-03-26 DIAGNOSIS — E538 Deficiency of other specified B group vitamins: Secondary | ICD-10-CM

## 2013-03-26 MED ORDER — CYANOCOBALAMIN 1000 MCG/ML IJ SOLN
1000.0000 ug | Freq: Once | INTRAMUSCULAR | Status: AC
  Administered 2013-03-26: 1000 ug via INTRAMUSCULAR

## 2013-04-06 ENCOUNTER — Other Ambulatory Visit: Payer: Self-pay | Admitting: Internal Medicine

## 2013-04-06 ENCOUNTER — Encounter: Payer: Self-pay | Admitting: Emergency Medicine

## 2013-04-22 ENCOUNTER — Other Ambulatory Visit: Payer: Self-pay | Admitting: Internal Medicine

## 2013-04-26 ENCOUNTER — Encounter: Payer: Self-pay | Admitting: Internal Medicine

## 2013-04-26 ENCOUNTER — Ambulatory Visit (INDEPENDENT_AMBULATORY_CARE_PROVIDER_SITE_OTHER): Payer: Medicare HMO | Admitting: Internal Medicine

## 2013-04-26 VITALS — BP 120/70 | HR 61 | Temp 98.0°F | Ht 62.0 in | Wt 231.2 lb

## 2013-04-26 DIAGNOSIS — E875 Hyperkalemia: Secondary | ICD-10-CM

## 2013-04-26 DIAGNOSIS — E7800 Pure hypercholesterolemia, unspecified: Secondary | ICD-10-CM

## 2013-04-26 DIAGNOSIS — K589 Irritable bowel syndrome without diarrhea: Secondary | ICD-10-CM

## 2013-04-26 DIAGNOSIS — E538 Deficiency of other specified B group vitamins: Secondary | ICD-10-CM

## 2013-04-26 DIAGNOSIS — I10 Essential (primary) hypertension: Secondary | ICD-10-CM

## 2013-04-26 DIAGNOSIS — E119 Type 2 diabetes mellitus without complications: Secondary | ICD-10-CM

## 2013-04-26 NOTE — Assessment & Plan Note (Signed)
Continue B12 injections.   

## 2013-04-26 NOTE — Assessment & Plan Note (Signed)
Bowels stable.  Up to date with colonoscopy.  States everything checked out fine.  Recommended follow up in 10 years.  Last colonoscopy 11/29/11.

## 2013-04-26 NOTE — Assessment & Plan Note (Signed)
Follow potassium.    

## 2013-04-26 NOTE — Progress Notes (Signed)
Subjective:    Patient ID: Beth Tyler, female    DOB: 06-21-1945, 68 y.o.   MRN: [id number]  HPI 68 year old female with past history of diabetes, hypertension and hypercholesterolemia who comes in today for a scheduled follow up.  Brought in no recorded sugar readings.  Sugars in the am averaging low 100s.  PM sugars averaging 160-245.   She has been under increased stress recently with her husband's and her daughters  Medical issues.  Her daughter was diagnosed with breast cancer.  Her son had an MI.  Her daughter-n-law had a stroke.  Overall she feels she is handling things relatively well.  Not in a routine with her eating.  May not eat lunch until 4:00.  Using insulin. Discussed importance of adjusting her diet and exercise.  Discussed the importance of eating regular meals.  Blood pressure is doing well.      Past Medical History  Diagnosis Date  . Diabetes mellitus, type II   . Hypertension   . Hypercholesterolemia   . GERD (gastroesophageal reflux disease)   . IBS (irritable bowel syndrome)   . Gastritis   . B12 deficiency   . Arthritis   . Diabetes mellitus 02/17/2012  . Anemia   . Pernicious anemia     Current Outpatient Prescriptions on File Prior to Visit  Medication Sig Dispense Refill  . amLODipine (NORVASC) 10 MG tablet Take 1 tablet (10 mg total) by mouth daily.  90 tablet  3  . aspirin 81 MG tablet Take 81 mg by mouth daily.      Marland Kitchen atenolol (TENORMIN) 100 MG tablet Take one tablet qam and 1/2 tablet qpm  135 tablet  2  . cyanocobalamin (,VITAMIN B-12,) 1000 MCG/ML injection Inject 1,000 mcg into the muscle every 30 (thirty) days.      . fluticasone (CUTIVATE) 0.05 % cream APPLY   TOPICALLY TO AFFECTED AREA AS NEEDED  30 g  0  . glyBURIDE micronized (GLYNASE) 6 MG tablet Take 1 tablet (6 mg total) by mouth 2 (two) times daily before a meal.  180 tablet  3  . hydrochlorothiazide (HYDRODIURIL) 25 MG tablet Take 1 tablet (25 mg total) by mouth daily.  90 tablet  1  .  insulin glargine (LANTUS) 100 UNIT/ML injection Inject 16 Units into the skin daily.      . insulin lispro ([name]) 100 UNIT/ML injection Inject 10 Units into the skin daily. Take 10 units before lunch      . lisinopril (PRINIVIL,ZESTRIL) 20 MG tablet Take one tablet in the am and two tablets in the pm  270 tablet  0  . metFORMIN (GLUCOPHAGE) 1000 MG tablet TAKE ONE TABLET BY MOUTH TWICE DAILY WITH MEALS  180 tablet  1  . omeprazole (PRILOSEC) 20 MG capsule TAKE ONE CAPSULE BY MOUTH TWICE DAILY  60 capsule  5  . pioglitazone (ACTOS) 30 MG tablet Take 1 tablet (30 mg total) by mouth daily.  90 tablet  3  . simvastatin (ZOCOR) 10 MG tablet TAKE ONE TABLET BY MOUTH AT BEDTIME  90 tablet  1   No current facility-administered medications on file prior to visit.    Review of Systems Patient denies any headache, lightheadedness or dizziness.  No significant sinus or allergy symptoms.  No chest pain, tightness or palpitations.  No increased shortness of breath, cough or congestion.  No nausea or vomiting.  No acid reflux. No abdominal pain or cramping.  No bowel change.  States her bowels  are stable.  No urine change.   Overall she feels she is handling things relatively well.  Sugars as outlined.  Getting back on track with her eating.       Objective:   Physical Exam  Filed Vitals:   04/26/13 1103  BP: 120/70  Pulse: 61  Temp: 98 F (1736.57 C)   [age] year old female in no acute distress.   HEENT:  Nares- clear.  Oropharynx - without lesions. NECK:  Supple.  Nontender.  No audible bruit.  HEART:  Appears to be regular. LUNGS:  No crackles or wheezing audible.  Respirations even and unlabored.  RADIAL PULSE:  Equal bilaterally.  ABDOMEN:  Soft, nontender.  Bowel sounds present and normal.  No audible abdominal bruit.     EXTREMITIES:  No increased edema present.   FEET:  No lesions.                    Assessment & Plan:  CARDIOVASCULAR.  Stress test 08/06/10 negative for ischemia.  Normal  heart function.  Currently asymptomatic.    HEALTH MAINTENANCE.  Physical 11/13/12.    Mammogram 01/08/13 - negative.  08/10/10 pap negative.

## 2013-04-26 NOTE — Assessment & Plan Note (Signed)
Blood pressure under good control.  Same meds.  Follow metabolic panel.  Blood pressures averaging 120-122/60-70.

## 2013-04-26 NOTE — Assessment & Plan Note (Signed)
On simvastatin.  Low cholesterol diet and exercise.  Follow lipid panel and liver function.  12/8//14 lipid panel revealed total cholesterol 161, triglycerides 126, HDL 42 and LDL 93.  She is taking her statin qod now.  Follow.

## 2013-04-26 NOTE — Assessment & Plan Note (Signed)
Sugars as outlined.   Last a1c 9.5. We discussed at length today regarding eating regular meals and not going long periods without eating.  Taking her medications regularly now.  Monitor sugars and record.  Follow metabolic panel and a1c.  Will increase Lantus by 2 units now and titrate up.  May need to add humalog at lunch if we can ger her eating lunch earlier.

## 2013-04-26 NOTE — Progress Notes (Signed)
Pre-visit discussion using our clinic review tool. No additional management support is needed unless otherwise documented below in the visit note.  

## 2013-05-11 ENCOUNTER — Ambulatory Visit: Payer: Medicare HMO

## 2013-05-14 ENCOUNTER — Ambulatory Visit (INDEPENDENT_AMBULATORY_CARE_PROVIDER_SITE_OTHER): Payer: Medicare HMO | Admitting: *Deleted

## 2013-05-14 DIAGNOSIS — E538 Deficiency of other specified B group vitamins: Secondary | ICD-10-CM

## 2013-05-14 MED ORDER — CYANOCOBALAMIN 1000 MCG/ML IJ SOLN
1000.0000 ug | Freq: Once | INTRAMUSCULAR | Status: AC
  Administered 2013-05-14: 1000 ug via INTRAMUSCULAR

## 2013-06-11 ENCOUNTER — Ambulatory Visit (INDEPENDENT_AMBULATORY_CARE_PROVIDER_SITE_OTHER): Payer: Commercial Managed Care - HMO | Admitting: Internal Medicine

## 2013-06-11 ENCOUNTER — Encounter: Payer: Self-pay | Admitting: Internal Medicine

## 2013-06-11 VITALS — BP 130/70 | HR 61 | Temp 98.2°F | Ht 62.0 in | Wt 230.2 lb

## 2013-06-11 DIAGNOSIS — E7800 Pure hypercholesterolemia, unspecified: Secondary | ICD-10-CM

## 2013-06-11 DIAGNOSIS — Z733 Stress, not elsewhere classified: Secondary | ICD-10-CM

## 2013-06-11 DIAGNOSIS — E119 Type 2 diabetes mellitus without complications: Secondary | ICD-10-CM

## 2013-06-11 DIAGNOSIS — E875 Hyperkalemia: Secondary | ICD-10-CM

## 2013-06-11 DIAGNOSIS — I10 Essential (primary) hypertension: Secondary | ICD-10-CM

## 2013-06-11 DIAGNOSIS — E538 Deficiency of other specified B group vitamins: Secondary | ICD-10-CM

## 2013-06-11 DIAGNOSIS — F439 Reaction to severe stress, unspecified: Secondary | ICD-10-CM

## 2013-06-11 DIAGNOSIS — K589 Irritable bowel syndrome without diarrhea: Secondary | ICD-10-CM

## 2013-06-11 DIAGNOSIS — Z23 Encounter for immunization: Secondary | ICD-10-CM

## 2013-06-11 MED ORDER — MAGNESIUM OXIDE 400 MG PO TABS: 400.0000 mg | ORAL_TABLET | Freq: Every day | ORAL | Status: DC

## 2013-06-11 NOTE — Progress Notes (Signed)
Pre-visit discussion using our clinic review tool. No additional management support is needed unless otherwise documented below in the visit note.  

## 2013-06-13 ENCOUNTER — Encounter: Payer: Self-pay | Admitting: Internal Medicine

## 2013-06-13 NOTE — Assessment & Plan Note (Signed)
Blood pressure under good control.  Same meds.  Follow metabolic panel.   

## 2013-06-13 NOTE — Progress Notes (Signed)
Subjective:    Patient ID: Beth Tyler, female    DOB: 1945-04-17, 68 y.o.   MRN: [id number]  HPI 68 year old female with past history of diabetes, hypertension and hypercholesterolemia who comes in today for a scheduled follow up.  Sugars in the am averaging low 100s.  PM sugars averaging 160-240.   She has been under increased stress recently with her husband's and her daughters medical issues.  Her daughter was diagnosed with breast cancer.  Her son had an MI.  Her daughter-n-law had a stroke.  Still in the hospital after eight months.  Overall she feels she is handling things relatively well.  Not in a routine with her eating.  May not eat lunch until late afternoon.  Using insulin. Discussed importance of adjusting her diet and exercise.  Discussed the importance of eating regular meals.  Blood pressure is doing well.   Feels she is handling stress relatively well.     Past Medical History  Diagnosis Date  . Diabetes mellitus, type II   . Hypertension   . Hypercholesterolemia   . GERD (gastroesophageal reflux disease)   . IBS (irritable bowel syndrome)   . Gastritis   . B12 deficiency   . Arthritis   . Diabetes mellitus 02/17/2012  . Anemia   . Pernicious anemia     Current Outpatient Prescriptions on File Prior to Visit  Medication Sig Dispense Refill  . amLODipine (NORVASC) 10 MG tablet Take 1 tablet (10 mg total) by mouth daily.  90 tablet  3  . aspirin 81 MG tablet Take 81 mg by mouth daily.      Marland Kitchen. atenolol (TENORMIN) 100 MG tablet Take one tablet qam and 1/2 tablet qpm  135 tablet  2  . cyanocobalamin (,VITAMIN B-12,) 1000 MCG/ML injection Inject 1,000 mcg into the muscle every 30 (thirty) days.      . fluticasone (CUTIVATE) 0.05 % cream APPLY   TOPICALLY TO AFFECTED AREA AS NEEDED  30 g  0  . glyBURIDE micronized (GLYNASE) 6 MG tablet Take 1 tablet (6 mg total) by mouth 2 (two) times daily before a meal.  180 tablet  3  . hydrochlorothiazide (HYDRODIURIL) 25 MG tablet  Take 1 tablet (25 mg total) by mouth daily.  90 tablet  1  . insulin glargine (LANTUS) 100 UNIT/ML injection Inject 16 Units into the skin daily.      . insulin lispro ([name]) 100 UNIT/ML injection Inject 10 Units into the skin daily. Take 10 units before lunch      . lisinopril (PRINIVIL,ZESTRIL) 20 MG tablet Take one tablet in the am and two tablets in the pm  270 tablet  0  . metFORMIN (GLUCOPHAGE) 1000 MG tablet TAKE ONE TABLET BY MOUTH TWICE DAILY WITH MEALS  180 tablet  1  . omeprazole (PRILOSEC) 20 MG capsule TAKE ONE CAPSULE BY MOUTH TWICE DAILY  60 capsule  5  . pioglitazone (ACTOS) 30 MG tablet Take 1 tablet (30 mg total) by mouth daily.  90 tablet  3  . simvastatin (ZOCOR) 10 MG tablet TAKE ONE TABLET BY MOUTH AT BEDTIME  90 tablet  1   No current facility-administered medications on file prior to visit.    Review of Systems Patient denies any headache, lightheadedness or dizziness.  No significant sinus or allergy symptoms.  No chest pain, tightness or palpitations.  No increased shortness of breath, cough or congestion.  No nausea or vomiting.  No acid reflux. No abdominal pain or  cramping.  No bowel change.  States her bowels are stable.  No urine change.   Overall she feels she is handling things relatively well.  Sugars as outlined.  Trying to watch what she eats.  Still out of routine.      Objective:   Physical Exam  Filed Vitals:   06/11/13 1608  BP: 130/70  Pulse: 61  Temp: 98.2 F (36.8 C)   Blood pressure recheck:  80/71  68 year old female in no acute distress.   HEENT:  Nares- clear.  Oropharynx - without lesions. NECK:  Supple.  Nontender.  No audible bruit.  HEART:  Appears to be regular. LUNGS:  No crackles or wheezing audible.  Respirations even and unlabored.  RADIAL PULSE:  Equal bilaterally.  ABDOMEN:  Soft, nontender.  Bowel sounds present and normal.  No audible abdominal bruit.     EXTREMITIES:  No increased edema present.   FEET:  No lesions.                     Assessment & Plan:  CARDIOVASCULAR.  Stress test 08/06/10 negative for ischemia.  Normal heart function.  Currently asymptomatic.    HEALTH MAINTENANCE.  Physical 11/13/12.    Mammogram 01/08/13 - negative.  08/10/10 pap negative.

## 2013-06-13 NOTE — Assessment & Plan Note (Signed)
Follow potassium.    

## 2013-06-13 NOTE — Assessment & Plan Note (Signed)
Sugars as outlined.   Last a1c 9.5. We again discussed at length today regarding eating regular meals and not going long periods without eating.  She is still not taking her medications regularly.  Monitor sugars and record.  Follow metabolic panel and a1c.  Will increase Lantus and instructed her how to titrate.  May need to add humalog at lunch if we can ger her eating lunch earlier.

## 2013-06-13 NOTE — Assessment & Plan Note (Signed)
On simvastatin.  Low cholesterol diet and exercise.  Follow lipid panel and liver function.  12/8//14 lipid panel revealed total cholesterol 161, triglycerides 126, HDL 42 and LDL 93.  She is taking her statin qod now.  Follow.

## 2013-06-13 NOTE — Assessment & Plan Note (Signed)
Bowels stable.  Up to date with colonoscopy.  States everything checked out fine.  Recommended follow up in 10 years.  Last colonoscopy 11/29/11.

## 2013-06-13 NOTE — Assessment & Plan Note (Signed)
Continue B12 injections.   

## 2013-06-14 DIAGNOSIS — F439 Reaction to severe stress, unspecified: Secondary | ICD-10-CM | POA: Insufficient documentation

## 2013-06-14 NOTE — Assessment & Plan Note (Signed)
Increased stress as outlined.  Feels she is handling things relatively well.  Follow.   

## 2013-06-18 ENCOUNTER — Ambulatory Visit (INDEPENDENT_AMBULATORY_CARE_PROVIDER_SITE_OTHER): Payer: Commercial Managed Care - HMO | Admitting: *Deleted

## 2013-06-18 ENCOUNTER — Other Ambulatory Visit: Payer: Self-pay | Admitting: Internal Medicine

## 2013-06-18 DIAGNOSIS — E538 Deficiency of other specified B group vitamins: Secondary | ICD-10-CM

## 2013-06-18 MED ORDER — CYANOCOBALAMIN 1000 MCG/ML IJ SOLN
1000.0000 ug | Freq: Once | INTRAMUSCULAR | Status: AC
  Administered 2013-06-18: 1000 ug via INTRAMUSCULAR

## 2013-06-19 ENCOUNTER — Other Ambulatory Visit: Payer: Self-pay | Admitting: Internal Medicine

## 2013-07-27 ENCOUNTER — Telehealth: Payer: Self-pay | Admitting: Internal Medicine

## 2013-07-27 NOTE — Telephone Encounter (Signed)
Pt coming in tomorrow @ 2:15 to see Raquel

## 2013-07-27 NOTE — Telephone Encounter (Signed)
Pt states she has had a low-grade fever and terrible cough.  Cough has kept her up x3 nights.  No appt available.  Asking for cough medicine or appt.  At least 20 minute arrival time if appt is available.  Please advise.

## 2013-07-28 ENCOUNTER — Encounter: Payer: Self-pay | Admitting: Adult Health

## 2013-07-28 ENCOUNTER — Ambulatory Visit (INDEPENDENT_AMBULATORY_CARE_PROVIDER_SITE_OTHER): Payer: Commercial Managed Care - HMO | Admitting: Adult Health

## 2013-07-28 VITALS — BP 122/66 | HR 66 | Temp 98.0°F | Resp 14 | Wt 226.0 lb

## 2013-07-28 DIAGNOSIS — J029 Acute pharyngitis, unspecified: Secondary | ICD-10-CM

## 2013-07-28 LAB — POCT RAPID STREP A (OFFICE): Rapid Strep A Screen: NEGATIVE

## 2013-07-28 MED ORDER — AZITHROMYCIN 250 MG PO TABS: ORAL_TABLET | ORAL | Status: DC

## 2013-07-28 MED ORDER — BENZONATATE 200 MG PO CAPS: 200.0000 mg | ORAL_CAPSULE | Freq: Two times a day (BID) | ORAL | Status: DC | PRN

## 2013-07-28 NOTE — Progress Notes (Signed)
Subjective:    Patient ID: Beth Tyler, female    DOB: 1946/02/03, 68 y.o.   MRN: [id number]  HPI Pt is a 68 y/o female who presents to clinic with a cough and low grade fever, sore throat, left ear pain. Currently afebrile. She has only been taking tylenol. Symptoms began last Friday.  Past Medical History  Diagnosis Date  . Diabetes mellitus, type II   . Hypertension   . Hypercholesterolemia   . GERD (gastroesophageal reflux disease)   . IBS (irritable bowel syndrome)   . Gastritis   . B12 deficiency   . Arthritis   . Diabetes mellitus 02/17/2012  . Anemia   . Pernicious anemia       Current Outpatient Prescriptions on File Prior to Visit  Medication Sig Dispense Refill  . amLODipine (NORVASC) 10 MG tablet Take 1 tablet (10 mg total) by mouth daily.  90 tablet  3  . aspirin 81 MG tablet Take 81 mg by mouth daily.      Marland Kitchen. atenolol (TENORMIN) 100 MG tablet Take one tablet qam and 1/2 tablet qpm  135 tablet  2  . cyanocobalamin (,VITAMIN B-12,) 1000 MCG/ML injection Inject 1,000 mcg into the muscle every 30 (thirty) days.      . ferrous fumarate (HEMOCYTE - 106 MG FE) 325 (106 FE) MG TABS tablet Take 1 tablet by mouth.      . fluticasone (CUTIVATE) 0.05 % cream APPLY   TOPICALLY TO AFFECTED AREA AS NEEDED  30 g  0  . glyBURIDE micronized (GLYNASE) 6 MG tablet TAKE ONE TABLET BY MOUTH TWICE DAILY WITH MEALS  180 tablet  0  . hydrochlorothiazide (HYDRODIURIL) 25 MG tablet Take 1 tablet (25 mg total) by mouth daily.  90 tablet  1  . insulin glargine (LANTUS) 100 UNIT/ML injection Inject 16 Units into the skin daily.      . insulin lispro ([name]) 100 UNIT/ML injection Inject 10 Units into the skin daily. Take 10 units before lunch      . lisinopril (PRINIVIL,ZESTRIL) 20 MG tablet Take one tablet in the am and two tablets in the pm  270 tablet  0  . lisinopril (PRINIVIL,ZESTRIL) 20 MG tablet TAKE TWO TABLETS BY MOUTH IN THE MORNING AND ONE IN THE EVENING  90 tablet  5  .  magnesium oxide (MAG-OX) 400 MG tablet Take 1 tablet (400 mg total) by mouth daily.  30 tablet  1  . metFORMIN (GLUCOPHAGE) 1000 MG tablet TAKE ONE TABLET BY MOUTH TWICE DAILY WITH MEALS  180 tablet  1  . omeprazole (PRILOSEC) 20 MG capsule TAKE ONE CAPSULE BY MOUTH TWICE DAILY  60 capsule  5  . pioglitazone (ACTOS) 30 MG tablet Take 1 tablet (30 mg total) by mouth daily.  90 tablet  3  . simvastatin (ZOCOR) 10 MG tablet TAKE ONE TABLET BY MOUTH AT BEDTIME  90 tablet  1   No current facility-administered medications on file prior to visit.      Review of Systems  Constitutional: Positive for fever and chills.  HENT: Positive for ear pain and sore throat.   Respiratory: Positive for cough.   Musculoskeletal:       Swollen and painful right hand s/p injury  All other systems reviewed and are negative.      Objective:   Physical Exam  Constitutional: She is oriented to person, place, and time. She appears well-developed and well-nourished. No distress.  HENT:  Head: Normocephalic and atraumatic.  Right Ear: External ear normal.  Left Ear: External ear normal.  Mouth/Throat: No oropharyngeal exudate.  Pharyngeal erythema. No exudate observed.  Cardiovascular: Normal rate, regular rhythm and normal heart sounds.  Exam reveals no gallop.   No murmur heard. Pulmonary/Chest: Effort normal and breath sounds normal. No respiratory distress. She has no wheezes. She has no rales.  Lymphadenopathy:    She has cervical adenopathy.  Neurological: She is alert and oriented to person, place, and time.  Psychiatric: She has a normal mood and affect. Her behavior is normal. Judgment and thought content normal.      Assessment & Plan:   1. Sore throat Reports hx of strep. Rapid strep negative. Will treat empiracally with azithromycin given her report of cough that is keeping her up at night and the significant sore throat. RTC if no improvement within 4-5 days. - POCT rapid strep A

## 2013-07-28 NOTE — Progress Notes (Signed)
Pre visit review using our clinic review tool, if applicable. No additional management support is needed unless otherwise documented below in the visit note. 

## 2013-07-28 NOTE — Patient Instructions (Signed)
  Start Azithromycin 2 tablets today then 1 tablet daily for the next 4 days.  Tessalon for cough as directed.  Drink fluids to stay hydrated.  Tylenol for general discomfort or fever.  Please let us know if you are not improved within 4-5 days or sooner if necessary.

## 2013-07-30 ENCOUNTER — Other Ambulatory Visit: Payer: Self-pay | Admitting: Internal Medicine

## 2013-08-09 ENCOUNTER — Other Ambulatory Visit: Payer: Commercial Managed Care - HMO

## 2013-08-10 ENCOUNTER — Other Ambulatory Visit: Payer: Self-pay | Admitting: Internal Medicine

## 2013-08-13 ENCOUNTER — Ambulatory Visit: Payer: Commercial Managed Care - HMO | Admitting: Internal Medicine

## 2013-08-20 ENCOUNTER — Telehealth: Payer: Self-pay | Admitting: *Deleted

## 2013-08-20 DIAGNOSIS — I10 Essential (primary) hypertension: Secondary | ICD-10-CM

## 2013-08-20 DIAGNOSIS — E875 Hyperkalemia: Secondary | ICD-10-CM

## 2013-08-20 DIAGNOSIS — E7800 Pure hypercholesterolemia, unspecified: Secondary | ICD-10-CM

## 2013-08-20 DIAGNOSIS — E119 Type 2 diabetes mellitus without complications: Secondary | ICD-10-CM

## 2013-08-20 NOTE — Telephone Encounter (Signed)
Pt is coming in Friday. What labs and dx? 

## 2013-08-23 NOTE — Telephone Encounter (Signed)
Order placed for f/u albs.   

## 2013-08-25 ENCOUNTER — Other Ambulatory Visit: Payer: Self-pay | Admitting: Internal Medicine

## 2013-08-27 ENCOUNTER — Other Ambulatory Visit: Payer: Commercial Managed Care - HMO

## 2013-08-27 ENCOUNTER — Ambulatory Visit (INDEPENDENT_AMBULATORY_CARE_PROVIDER_SITE_OTHER): Payer: Commercial Managed Care - HMO | Admitting: *Deleted

## 2013-08-27 DIAGNOSIS — E538 Deficiency of other specified B group vitamins: Secondary | ICD-10-CM

## 2013-08-27 MED ORDER — CYANOCOBALAMIN 1000 MCG/ML IJ SOLN
1000.0000 ug | Freq: Once | INTRAMUSCULAR | Status: AC
  Administered 2013-08-27: 1000 ug via INTRAMUSCULAR

## 2013-09-03 ENCOUNTER — Ambulatory Visit: Payer: Commercial Managed Care - HMO | Admitting: Internal Medicine

## 2013-09-22 ENCOUNTER — Other Ambulatory Visit: Payer: Self-pay | Admitting: *Deleted

## 2013-09-22 MED ORDER — GLYBURIDE MICRONIZED 6 MG PO TABS: ORAL_TABLET | ORAL | Status: DC

## 2013-09-24 ENCOUNTER — Telehealth: Payer: Self-pay | Admitting: *Deleted

## 2013-09-24 NOTE — Telephone Encounter (Signed)
PA for Glyburide completed online, pending approval.

## 2013-09-27 NOTE — Telephone Encounter (Signed)
[facility] approving Glyburide thru 04/07/14

## 2013-10-01 ENCOUNTER — Other Ambulatory Visit (INDEPENDENT_AMBULATORY_CARE_PROVIDER_SITE_OTHER): Payer: Commercial Managed Care - HMO

## 2013-10-01 DIAGNOSIS — I10 Essential (primary) hypertension: Secondary | ICD-10-CM

## 2013-10-01 DIAGNOSIS — E7800 Pure hypercholesterolemia, unspecified: Secondary | ICD-10-CM

## 2013-10-01 DIAGNOSIS — E119 Type 2 diabetes mellitus without complications: Secondary | ICD-10-CM

## 2013-10-01 LAB — LIPID PANEL
CHOL/HDL RATIO: 3
Cholesterol: 177 mg/dL (ref 0–200)
HDL: 52.1 mg/dL (ref >39.00)
LDL Cholesterol: 99 mg/dL (ref 0–99)
NonHDL: 124.9
TRIGLYCERIDES: 132 mg/dL (ref 0.0–149.0)
VLDL: 26.4 mg/dL (ref 0.0–40.0)

## 2013-10-01 LAB — CBC WITH DIFFERENTIAL/PLATELET
BASOS ABS: 0.1 10*3/uL (ref 0.0–0.1)
Basophils Relative: 0.6 % (ref 0.0–3.0)
EOS ABS: 0.4 10*3/uL (ref 0.0–0.7)
Eosinophils Relative: 5.4 % — ABNORMAL HIGH (ref 0.0–5.0)
HCT: 37.5 % (ref 36.0–46.0)
HEMOGLOBIN: 12.6 g/dL (ref 12.0–15.0)
LYMPHS PCT: 31.2 % (ref 12.0–46.0)
Lymphs Abs: 2.5 10*3/uL (ref 0.7–4.0)
MCHC: 33.5 g/dL (ref 30.0–36.0)
MCV: 88.6 fl (ref 78.0–100.0)
Monocytes Absolute: 0.7 10*3/uL (ref 0.1–1.0)
Monocytes Relative: 8.5 % (ref 3.0–12.0)
Neutro Abs: 4.3 10*3/uL (ref 1.4–7.7)
Neutrophils Relative %: 54.3 % (ref 43.0–77.0)
Platelets: 246 10*3/uL (ref 150.0–400.0)
RBC: 4.23 Mil/uL (ref 3.87–5.11)
RDW: 14.3 % (ref 11.5–15.5)
WBC: 7.9 10*3/uL (ref 4.0–10.5)

## 2013-10-01 LAB — HEMOGLOBIN A1C: HEMOGLOBIN A1C: 7.6 % — AB (ref 4.6–6.5)

## 2013-10-01 LAB — HEPATIC FUNCTION PANEL
ALBUMIN: 4.3 g/dL (ref 3.5–5.2)
ALK PHOS: 59 U/L (ref 39–117)
ALT: 22 U/L (ref 0–35)
AST: 18 U/L (ref 0–37)
Bilirubin, Direct: 0.1 mg/dL (ref 0.0–0.3)
Total Bilirubin: 0.4 mg/dL (ref 0.2–1.2)
Total Protein: 7.3 g/dL (ref 6.0–8.3)

## 2013-10-01 LAB — MICROALBUMIN / CREATININE URINE RATIO
Creatinine,U: 55.1 mg/dL
Microalb Creat Ratio: 2.4 mg/g (ref 0.0–30.0)
Microalb, Ur: 1.3 mg/dL (ref 0.0–1.9)

## 2013-10-01 LAB — BASIC METABOLIC PANEL
BUN: 20 mg/dL (ref 6–23)
CHLORIDE: 104 meq/L (ref 96–112)
CO2: 28 meq/L (ref 19–32)
CREATININE: 0.8 mg/dL (ref 0.4–1.2)
Calcium: 9.5 mg/dL (ref 8.4–10.5)
GFR: 71.45 mL/min (ref >60.00)
Glucose, Bld: 104 mg/dL — ABNORMAL HIGH (ref 70–99)
Potassium: 5.3 mEq/L — ABNORMAL HIGH (ref 3.5–5.1)
SODIUM: 141 meq/L (ref 135–145)

## 2013-10-02 ENCOUNTER — Other Ambulatory Visit: Payer: Self-pay | Admitting: Internal Medicine

## 2013-10-02 ENCOUNTER — Encounter: Payer: Self-pay | Admitting: Internal Medicine

## 2013-10-02 DIAGNOSIS — E875 Hyperkalemia: Secondary | ICD-10-CM

## 2013-10-02 NOTE — Progress Notes (Signed)
Order placed for f/u lab.   

## 2013-10-04 ENCOUNTER — Encounter: Payer: Self-pay | Admitting: *Deleted

## 2013-10-04 NOTE — Telephone Encounter (Signed)
Pt notified of lab appt via telephone

## 2013-10-04 NOTE — Telephone Encounter (Signed)
Call pt & notified her to check her mychart for results & lab appt

## 2013-10-06 ENCOUNTER — Other Ambulatory Visit: Payer: Commercial Managed Care - HMO

## 2013-10-07 ENCOUNTER — Other Ambulatory Visit (INDEPENDENT_AMBULATORY_CARE_PROVIDER_SITE_OTHER): Payer: Commercial Managed Care - HMO

## 2013-10-07 ENCOUNTER — Ambulatory Visit: Payer: Commercial Managed Care - HMO | Admitting: Internal Medicine

## 2013-10-07 DIAGNOSIS — E875 Hyperkalemia: Secondary | ICD-10-CM

## 2013-10-07 LAB — POTASSIUM: Potassium: 5.5 mEq/L — ABNORMAL HIGH (ref 3.5–5.3)

## 2013-10-10 ENCOUNTER — Encounter: Payer: Self-pay | Admitting: Internal Medicine

## 2013-10-10 ENCOUNTER — Other Ambulatory Visit: Payer: Self-pay | Admitting: Internal Medicine

## 2013-10-12 ENCOUNTER — Other Ambulatory Visit: Payer: Self-pay | Admitting: *Deleted

## 2013-10-12 MED ORDER — HYDROCHLOROTHIAZIDE 25 MG PO TABS: 25.0000 mg | ORAL_TABLET | Freq: Every day | ORAL | Status: DC

## 2013-10-15 ENCOUNTER — Ambulatory Visit (INDEPENDENT_AMBULATORY_CARE_PROVIDER_SITE_OTHER): Payer: Commercial Managed Care - HMO | Admitting: Internal Medicine

## 2013-10-15 ENCOUNTER — Encounter: Payer: Self-pay | Admitting: Internal Medicine

## 2013-10-15 VITALS — BP 130/60 | HR 58 | Temp 98.2°F | Ht 62.0 in | Wt 235.0 lb

## 2013-10-15 DIAGNOSIS — I10 Essential (primary) hypertension: Secondary | ICD-10-CM

## 2013-10-15 DIAGNOSIS — K589 Irritable bowel syndrome without diarrhea: Secondary | ICD-10-CM

## 2013-10-15 DIAGNOSIS — E7800 Pure hypercholesterolemia, unspecified: Secondary | ICD-10-CM

## 2013-10-15 DIAGNOSIS — E538 Deficiency of other specified B group vitamins: Secondary | ICD-10-CM

## 2013-10-15 DIAGNOSIS — Z733 Stress, not elsewhere classified: Secondary | ICD-10-CM

## 2013-10-15 DIAGNOSIS — F439 Reaction to severe stress, unspecified: Secondary | ICD-10-CM

## 2013-10-15 DIAGNOSIS — E1149 Type 2 diabetes mellitus with other diabetic neurological complication: Secondary | ICD-10-CM

## 2013-10-15 DIAGNOSIS — E1140 Type 2 diabetes mellitus with diabetic neuropathy, unspecified: Secondary | ICD-10-CM

## 2013-10-15 DIAGNOSIS — E1142 Type 2 diabetes mellitus with diabetic polyneuropathy: Secondary | ICD-10-CM

## 2013-10-15 DIAGNOSIS — E875 Hyperkalemia: Secondary | ICD-10-CM

## 2013-10-15 MED ORDER — CYANOCOBALAMIN 1000 MCG/ML IJ SOLN
1000.0000 ug | Freq: Once | INTRAMUSCULAR | Status: AC
  Administered 2013-10-15: 1000 ug via INTRAMUSCULAR

## 2013-10-15 NOTE — Progress Notes (Signed)
Pre visit review using our clinic review tool, if applicable. No additional management support is needed unless otherwise documented below in the visit note. 

## 2013-10-20 ENCOUNTER — Encounter: Payer: Self-pay | Admitting: Internal Medicine

## 2013-10-20 NOTE — Progress Notes (Signed)
Subjective:    Patient ID: Beth Tyler, female    DOB: 1945/07/27, 68 y.o.   MRN: [id number]  HPI 68 year old female with past history of diabetes, hypertension and hypercholesterolemia who comes in today for a scheduled follow up.  Sugars in the am averaging 90-120.  Afternoon average 180 and night average 180-240.   She has been under increased stress recently with her husband's and her daughters medical issues.  Her daughter was diagnosed with breast cancer.  Her son had an MI.  Her daughter-n-law had a stroke.  Has been in the hospital for almost one year now.  Overall she feels she is handling things relatively well.  Not in a routine with her eating.  May not eat lunch until late afternoon.  Using insulin.  Discussed importance of adjusting her diet and exercise.  Discussed the importance of eating regular meals.  Blood pressure is doing well.   Feels she is handling stress relatively well.  She plans to retire soon and will be able to get in to more of a routine then.  Sugars have come down.     Past Medical History  Diagnosis Date  . Diabetes mellitus, type II   . Hypertension   . Hypercholesterolemia   . GERD (gastroesophageal reflux disease)   . IBS (irritable bowel syndrome)   . Gastritis   . B12 deficiency   . Arthritis   . Diabetes mellitus 02/17/2012  . Anemia   . Pernicious anemia     Current Outpatient Prescriptions on File Prior to Visit  Medication Sig Dispense Refill  . amLODipine (NORVASC) 10 MG tablet TAKE ONE TABLET BY MOUTH ONCE DAILY  90 tablet  1  . aspirin 81 MG tablet Take 81 mg by mouth daily.      Marland Kitchen. atenolol (TENORMIN) 100 MG tablet TAKE ONE TABLET BY MOUTH IN THE MORNING AND ONE-HALF IN THE EVENING  135 tablet  0  . cyanocobalamin (,VITAMIN B-12,) 1000 MCG/ML injection Inject 1,000 mcg into the muscle every 30 (thirty) days.      . ferrous fumarate (HEMOCYTE - 106 MG FE) 325 (106 FE) MG TABS tablet Take 1 tablet by mouth.      . fluticasone (CUTIVATE)  0.05 % cream APPLY   TOPICALLY TO AFFECTED AREA AS NEEDED  30 g  0  . glyBURIDE micronized (GLYNASE) 6 MG tablet TAKE ONE TABLET BY MOUTH TWICE DAILY WITH MEALS  180 tablet  2  . hydrochlorothiazide (HYDRODIURIL) 25 MG tablet Take 1 tablet (25 mg total) by mouth daily.  90 tablet  1  . insulin glargine (LANTUS) 100 UNIT/ML injection Inject 16 Units into the skin daily.      . insulin lispro ([name]) 100 UNIT/ML injection Inject 10 Units into the skin daily. Take 10 units before lunch      . lisinopril (PRINIVIL,ZESTRIL) 20 MG tablet Take one tablet in the am and two tablets in the pm  270 tablet  0  . magnesium oxide (MAG-OX) 400 MG tablet TAKE ONE TABLET BY MOUTH ONCE DAILY  30 tablet  5  . metFORMIN (GLUCOPHAGE) 1000 MG tablet TAKE ONE TABLET BY MOUTH TWICE DAILY WITH MEALS  180 tablet  1  . omeprazole (PRILOSEC) 20 MG capsule TAKE ONE CAPSULE BY MOUTH TWICE DAILY  60 capsule  5  . pioglitazone (ACTOS) 30 MG tablet TAKE ONE TABLET BY MOUTH ONCE DAILY  90 tablet  0  . simvastatin (ZOCOR) 10 MG tablet TAKE ONE  TABLET BY MOUTH AT BEDTIME  90 tablet  1   No current facility-administered medications on file prior to visit.    Review of Systems Patient denies any headache, lightheadedness or dizziness.  No significant sinus or allergy symptoms.  No chest pain, tightness or palpitations.  No increased shortness of breath, cough or congestion.  No nausea or vomiting.  No acid reflux. No abdominal pain or cramping.  No bowel change.  States her bowels are stable.  No urine change.   Overall she feels she is handling things relatively well.  Sugars as outlined.  Trying to watch what she eats.  Still out of routine.  Sugars are better.  Last a1c improved.  Overall she feel things are stable.       Objective:   Physical Exam  Filed Vitals:   10/15/13 1121  BP: 130/60  Pulse: 58  Temp: 98.2 F (51.52 C)   68 year old female in no acute distress.   HEENT:  Nares- clear.  Oropharynx - without  lesions. NECK:  Supple.  Nontender.  No audible bruit.  HEART:  Appears to be regular. LUNGS:  No crackles or wheezing audible.  Respirations even and unlabored.  RADIAL PULSE:  Equal bilaterally.  ABDOMEN:  Soft, nontender.  Bowel sounds present and normal.  No audible abdominal bruit.     EXTREMITIES:  No increased edema present.   FEET:  No lesions.                    Assessment & Plan:  CARDIOVASCULAR.  Stress test 08/06/10 negative for ischemia.  Normal heart function.  Currently asymptomatic.    HEALTH MAINTENANCE.  Physical 11/13/12.    Mammogram 01/08/13 - negative.  08/10/10 pap negative.

## 2013-10-21 ENCOUNTER — Encounter: Payer: Self-pay | Admitting: Internal Medicine

## 2013-10-21 NOTE — Assessment & Plan Note (Signed)
Blood pressure under good control.  Same meds.  Follow metabolic panel.   

## 2013-10-21 NOTE — Assessment & Plan Note (Signed)
Follow potassium.  Stable elevation.  Follow.

## 2013-10-21 NOTE — Assessment & Plan Note (Signed)
On simvastatin.  Low cholesterol diet and exercise.  Follow lipid panel and liver function.      

## 2013-10-21 NOTE — Assessment & Plan Note (Signed)
Bowels stable.  Up to date with colonoscopy.  States everything checked out fine.  Recommended follow up in 10 years.  Last colonoscopy 11/29/11.

## 2013-10-21 NOTE — Assessment & Plan Note (Signed)
Sugars as outlined.   Last a1c improved.  Overall sugars are trending down.   We again discussed at length today regarding eating regular meals and not going long periods without eating.  Continue to monitor sugars and record.  Follow metabolic panel and a1c.  I have instructed her how to titrate her Lantus.  She is doing better with her meals.  She is planning to retire soon and feels that this will allow her to be more in a routine with her meals.

## 2013-10-21 NOTE — Assessment & Plan Note (Signed)
Continue B12 injections.   

## 2013-10-21 NOTE — Assessment & Plan Note (Signed)
Increased stress as outlined.  Feels she is handling things relatively well.  Follow.   

## 2013-11-10 ENCOUNTER — Other Ambulatory Visit: Payer: Self-pay | Admitting: Internal Medicine

## 2013-11-12 ENCOUNTER — Other Ambulatory Visit: Payer: Commercial Managed Care - HMO

## 2013-11-17 ENCOUNTER — Telehealth: Payer: Self-pay | Admitting: *Deleted

## 2013-11-17 ENCOUNTER — Other Ambulatory Visit (INDEPENDENT_AMBULATORY_CARE_PROVIDER_SITE_OTHER): Payer: Commercial Managed Care - HMO

## 2013-11-17 DIAGNOSIS — E875 Hyperkalemia: Secondary | ICD-10-CM

## 2013-11-17 LAB — POTASSIUM: POTASSIUM: 5 meq/L (ref 3.5–5.1)

## 2013-11-17 MED ORDER — GLIPIZIDE ER 5 MG PO TB24: 5.0000 mg | ORAL_TABLET | Freq: Every day | ORAL | Status: DC

## 2013-11-17 NOTE — Telephone Encounter (Signed)
rx sent in for glipizide ER 5mg  #90 with one refill.  glynase d/c'd

## 2013-11-17 NOTE — Telephone Encounter (Signed)
Left detailed message notifying pt that we received a recommending a change in her medication therapy due to high risk. Dr. Lorin PicketScott would like to change her Glyburide 6mg  BID to Glipizide ER 5mg  QD (1st), then titrate. If agreeable, let Dr. Lorin PicketScott know.

## 2013-11-17 NOTE — Telephone Encounter (Signed)
Pt returned my call & states that she is will to switch to the Glipizide ER 5mg  once daily & titrate. [facility]

## 2013-11-18 ENCOUNTER — Encounter: Payer: Self-pay | Admitting: *Deleted

## 2013-12-25 ENCOUNTER — Other Ambulatory Visit: Payer: Self-pay | Admitting: Internal Medicine

## 2014-01-15 ENCOUNTER — Other Ambulatory Visit: Payer: Self-pay | Admitting: Internal Medicine

## 2014-01-19 ENCOUNTER — Other Ambulatory Visit: Payer: Self-pay | Admitting: *Deleted

## 2014-01-19 ENCOUNTER — Encounter: Payer: Self-pay | Admitting: Internal Medicine

## 2014-01-19 ENCOUNTER — Ambulatory Visit (INDEPENDENT_AMBULATORY_CARE_PROVIDER_SITE_OTHER): Payer: Commercial Managed Care - HMO | Admitting: Internal Medicine

## 2014-01-19 VITALS — BP 130/70 | HR 60 | Temp 98.5°F | Ht 61.5 in | Wt 241.0 lb

## 2014-01-19 DIAGNOSIS — E7800 Pure hypercholesterolemia, unspecified: Secondary | ICD-10-CM

## 2014-01-19 DIAGNOSIS — F439 Reaction to severe stress, unspecified: Secondary | ICD-10-CM

## 2014-01-19 DIAGNOSIS — K589 Irritable bowel syndrome without diarrhea: Secondary | ICD-10-CM

## 2014-01-19 DIAGNOSIS — Z658 Other specified problems related to psychosocial circumstances: Secondary | ICD-10-CM

## 2014-01-19 DIAGNOSIS — E1140 Type 2 diabetes mellitus with diabetic neuropathy, unspecified: Secondary | ICD-10-CM

## 2014-01-19 DIAGNOSIS — Z1211 Encounter for screening for malignant neoplasm of colon: Secondary | ICD-10-CM

## 2014-01-19 DIAGNOSIS — Z1239 Encounter for other screening for malignant neoplasm of breast: Secondary | ICD-10-CM

## 2014-01-19 DIAGNOSIS — H6121 Impacted cerumen, right ear: Secondary | ICD-10-CM

## 2014-01-19 DIAGNOSIS — E538 Deficiency of other specified B group vitamins: Secondary | ICD-10-CM

## 2014-01-19 DIAGNOSIS — I10 Essential (primary) hypertension: Secondary | ICD-10-CM

## 2014-01-19 DIAGNOSIS — Z23 Encounter for immunization: Secondary | ICD-10-CM

## 2014-01-19 DIAGNOSIS — E875 Hyperkalemia: Secondary | ICD-10-CM

## 2014-01-19 LAB — LIPID PANEL
CHOLESTEROL: 204 mg/dL — AB (ref 0–200)
HDL: 36.4 mg/dL — AB (ref >39.00)
LDL Cholesterol: 134 mg/dL — ABNORMAL HIGH (ref 0–99)
NONHDL: 167.6
Total CHOL/HDL Ratio: 6
Triglycerides: 167 mg/dL — ABNORMAL HIGH (ref 0.0–149.0)
VLDL: 33.4 mg/dL (ref 0.0–40.0)

## 2014-01-19 LAB — HEPATIC FUNCTION PANEL
ALK PHOS: 72 U/L (ref 39–117)
ALT: 23 U/L (ref 0–35)
AST: 17 U/L (ref 0–37)
Albumin: 3.6 g/dL (ref 3.5–5.2)
BILIRUBIN TOTAL: 0.7 mg/dL (ref 0.2–1.2)
Bilirubin, Direct: 0 mg/dL (ref 0.0–0.3)
Total Protein: 7.7 g/dL (ref 6.0–8.3)

## 2014-01-19 LAB — BASIC METABOLIC PANEL
BUN: 19 mg/dL (ref 6–23)
CALCIUM: 9.3 mg/dL (ref 8.4–10.5)
CO2: 28 mEq/L (ref 19–32)
Chloride: 105 mEq/L (ref 96–112)
Creatinine, Ser: 0.9 mg/dL (ref 0.4–1.2)
GFR: 66.78 mL/min (ref >60.00)
Glucose, Bld: 113 mg/dL — ABNORMAL HIGH (ref 70–99)
Potassium: 4.8 mEq/L (ref 3.5–5.1)
SODIUM: 140 meq/L (ref 135–145)

## 2014-01-19 LAB — HEMOGLOBIN A1C: Hgb A1c MFr Bld: 9.1 % — ABNORMAL HIGH (ref 4.6–6.5)

## 2014-01-19 LAB — TSH: TSH: 1.7 u[IU]/mL (ref 0.35–4.50)

## 2014-01-19 MED ORDER — CYANOCOBALAMIN 1000 MCG/ML IJ SOLN
1000.0000 ug | Freq: Once | INTRAMUSCULAR | Status: AC
  Administered 2014-01-19: 1000 ug via INTRAMUSCULAR

## 2014-01-19 MED ORDER — AMLODIPINE BESYLATE 10 MG PO TABS: ORAL_TABLET | ORAL | Status: DC

## 2014-01-19 NOTE — Assessment & Plan Note (Signed)
Still with loose stool.  She desires no further w/up.  Follow.  Had 2013 colonoscopy.

## 2014-01-19 NOTE — Assessment & Plan Note (Signed)
Last check - potassium 5.  Recheck today.

## 2014-01-19 NOTE — Assessment & Plan Note (Signed)
Continue B12 injections.  b12 injection given today.

## 2014-01-19 NOTE — Assessment & Plan Note (Signed)
On simvastatin.  Low cholesterol diet and exercise.  Follow lipid panel and liver function.      

## 2014-01-19 NOTE — Assessment & Plan Note (Signed)
Colonoscopy 2013.  Recommended f/u in 10 years.

## 2014-01-19 NOTE — Assessment & Plan Note (Signed)
Cerumen impaction and persistent irritation in he right ear.  Refer to ENT for evaluation.

## 2014-01-19 NOTE — Progress Notes (Signed)
Subjective:    Patient ID: Beth Tyler, female    DOB: [birth year], 68 y.o.   MRN: [id number]  HPI 68 year old female with past history of diabetes, hypertension and hypercholesterolemia who comes in today to follow up on these issues as well as for a complete physical exam.  Sugars in the am averaging 120.  Afternoon average 150 and night average low 200 (160-220).   She has been under increased stress recently with her husband's and her daughters medical issues.  Her daughter was diagnosed with breast cancer.  is doing well.  Has had to have multiple surgeries.  Her son had an MI.  He is doing well, but planning to have more surgery soon.  Her daughter-n-law had a stroke.  Has been in the hospital.  Is now home.  They are discussing initiating hospice.   Overall she feels she is handling things relatively well.  Not in a routine with her eating. She has really tried to adjust her diet. No fast foods. No fried foods.  Using insulin.  Have discussed importance of adjusting her diet and exercise.  Have discussed the importance of eating regular meals.  Blood pressure is doing well.  She has now retired.      Past Medical History  Diagnosis Date  . Diabetes mellitus, type II   . Hypertension   . Hypercholesterolemia   . GERD (gastroesophageal reflux disease)   . IBS (irritable bowel syndrome)   . Gastritis   . B12 deficiency   . Arthritis   . Diabetes mellitus 02/17/2012  . Anemia   . Pernicious anemia     Current Outpatient Prescriptions on File Prior to Visit  Medication Sig Dispense Refill  . amLODipine (NORVASC) 10 MG tablet TAKE ONE TABLET BY MOUTH ONCE DAILY  90 tablet  1  . aspirin 81 MG tablet Take 81 mg by mouth daily.      Marland Kitchen. atenolol (TENORMIN) 100 MG tablet TAKE ONE TABLET BY MOUTH IN THE MORNING AND ONE-HALF IN THE EVENING  135 tablet  0  . cyanocobalamin (,VITAMIN B-12,) 1000 MCG/ML injection Inject 1,000 mcg into the muscle every 30 (thirty) days.      . ferrous fumarate  (HEMOCYTE - 106 MG FE) 325 (106 FE) MG TABS tablet Take 1 tablet by mouth.      . fluticasone (CUTIVATE) 0.05 % cream APPLY   TOPICALLY TO AFFECTED AREA AS NEEDED  30 g  0  . glipiZIDE (GLUCOTROL XL) 5 MG 24 hr tablet Take 1 tablet (5 mg total) by mouth daily with breakfast.  90 tablet  1  . hydrochlorothiazide (HYDRODIURIL) 25 MG tablet Take 1 tablet (25 mg total) by mouth daily.  90 tablet  1  . insulin glargine (LANTUS) 100 UNIT/ML injection Inject 16 Units into the skin daily.      . insulin lispro ([name]) 100 UNIT/ML injection Inject 10 Units into the skin daily. Take 10 units before lunch      . lisinopril (PRINIVIL,ZESTRIL) 20 MG tablet Take one tablet in the am and two tablets in the pm  270 tablet  0  . magnesium oxide (MAG-OX) 400 MG tablet TAKE ONE TABLET BY MOUTH ONCE DAILY  30 tablet  5  . metFORMIN (GLUCOPHAGE) 1000 MG tablet TAKE ONE TABLET BY MOUTH TWICE DAILY WITH MEALS  180 tablet  1  . omeprazole (PRILOSEC) 20 MG capsule TAKE ONE CAPSULE BY MOUTH TWICE DAILY  60 capsule  5  . pioglitazone (  ACTOS) 30 MG tablet TAKE ONE TABLET BY MOUTH ONCE DAILY  90 tablet  1   No current facility-administered medications on file prior to visit.    Review of Systems Patient denies any headache, lightheadedness or dizziness.  No significant sinus or allergy symptoms.  No chest pain, tightness or palpitations.  No increased shortness of breath, cough or congestion.  No nausea or vomiting.  No acid reflux. No abdominal pain or cramping.  Still with loose stools.  Has IBS. No blood in her stool.  No urine change.   Overall she feels she is handling things relatively well.  Sugars as outlined.  Trying to watch what she eats.  Still out of routine.  Sugars are better.  Overall she feel things are stable.       Objective:   Physical Exam  Filed Vitals:   01/19/14 0911  BP: 130/70  Pulse: 60  Temp: 98.5 F (36.9 C)   Blood pressure recheck:  15/102  68 year old female in no acute distress.    HEENT:  Nares- clear.  Oropharynx - without lesions. NECK:  Supple.  Nontender.  No audible bruit.  HEART:  Appears to be regular. LUNGS:  No crackles or wheezing audible.  Respirations even and unlabored.  RADIAL PULSE:  Equal bilaterally.    BREASTS:  No nipple discharge or nipple retraction present.  Could not appreciate any distinct nodules or axillary adenopathy.  ABDOMEN:  Soft, nontender.  Bowel sounds present and normal.  No audible abdominal bruit.  GU:  Not performed.  She declined.    EXTREMITIES:  No increased edema present.  DP pulses palpable and equal bilaterally.  FEET:  No lesions.             Assessment & Plan:  CARDIOVASCULAR.  Stress test 08/06/10 negative for ischemia.  Normal heart function.  Currently asymptomatic.    HEALTH MAINTENANCE.  Physical today..    Mammogram 01/08/13 - negative.  Schedule f/u mammogram today.  08/10/10 pap negative.   Wanted to hold on pap this year.    Problem List Items Addressed This Visit   B12 deficiency     Continue B12 injections.  b12 injection given today.       Relevant Medications      cyanocobalamin ((VITAMIN B-12)) injection 1,000 mcg (Completed)   Cerumen impaction     Cerumen impaction and persistent irritation in he right ear.  Refer to ENT for evaluation.       Relevant Orders      Ambulatory referral to ENT   Colon cancer screening     Colonoscopy 2013.  Recommended f/u in 10 years.      Diabetes mellitus - Primary      Sugars as outlined.  Has adjusted her diet.  Same medications.  Sugars have been better.  Check metabolic panel and a1c.  Urine microalbumin/cr ratio6/26/15 - negative.    Lab Results  Component Value Date   HGBA1C 7.6* 10/01/2013        Relevant Orders      Hepatic function panel      Hemoglobin A1c      TSH   Hypercholesterolemia     On simvastatin.  Low cholesterol diet and exercise.  Follow lipid panel and liver function.       Relevant Orders      Lipid panel   Hyperkalemia      Last check - potassium 5.  Recheck today.  Hypertension     Blood pressure doing well.  Same medication regimen.  Check metabolic panel.      Relevant Orders      Basic metabolic panel   IBS (irritable bowel syndrome)     Still with loose stool.  She desires no further w/up.  Follow.  Had 2013 colonoscopy.      Stress     Increased stress as outlined.  Feels she is handling things relatively well.  Follow.        Other Visit Diagnoses   Encounter for immunization        Breast cancer screening        Relevant Orders       MM DIGITAL SCREENING BILATERAL

## 2014-01-19 NOTE — Progress Notes (Signed)
Pre visit review using our clinic review tool, if applicable. No additional management support is needed unless otherwise documented below in the visit note. 

## 2014-01-19 NOTE — Assessment & Plan Note (Signed)
Blood pressure doing well.  Same medication regimen.  Check metabolic panel.  

## 2014-01-19 NOTE — Assessment & Plan Note (Signed)
Increased stress as outlined.  Feels she is handling things relatively well.  Follow.   

## 2014-01-19 NOTE — Assessment & Plan Note (Signed)
Sugars as outlined.  Has adjusted her diet.  Same medications.  Sugars have been better.  Check metabolic panel and a1c.  Urine microalbumin/cr ratio6/26/15 - negative.    Lab Results  Component Value Date   HGBA1C 7.6* 10/01/2013

## 2014-01-24 ENCOUNTER — Encounter: Payer: Self-pay | Admitting: *Deleted

## 2014-01-25 ENCOUNTER — Encounter: Payer: Self-pay | Admitting: *Deleted

## 2014-01-27 ENCOUNTER — Ambulatory Visit: Payer: Self-pay | Admitting: Internal Medicine

## 2014-01-27 LAB — HM MAMMOGRAPHY: HM Mammogram: NEGATIVE

## 2014-01-28 ENCOUNTER — Encounter: Payer: Self-pay | Admitting: *Deleted

## 2014-02-03 ENCOUNTER — Other Ambulatory Visit: Payer: Self-pay | Admitting: Internal Medicine

## 2014-02-08 ENCOUNTER — Encounter: Payer: Self-pay | Admitting: Internal Medicine

## 2014-02-22 ENCOUNTER — Encounter: Payer: Self-pay | Admitting: Internal Medicine

## 2014-03-05 ENCOUNTER — Other Ambulatory Visit: Payer: Self-pay | Admitting: Internal Medicine

## 2014-03-17 ENCOUNTER — Ambulatory Visit (INDEPENDENT_AMBULATORY_CARE_PROVIDER_SITE_OTHER): Payer: Commercial Managed Care - HMO | Admitting: *Deleted

## 2014-03-17 DIAGNOSIS — E538 Deficiency of other specified B group vitamins: Secondary | ICD-10-CM

## 2014-03-17 MED ORDER — CYANOCOBALAMIN 1000 MCG/ML IJ SOLN
1000.0000 ug | Freq: Once | INTRAMUSCULAR | Status: AC
  Administered 2014-03-17: 1000 ug via INTRAMUSCULAR

## 2014-03-22 ENCOUNTER — Ambulatory Visit: Payer: Commercial Managed Care - HMO

## 2014-03-29 LAB — HM DIABETES EYE EXAM

## 2014-04-01 ENCOUNTER — Other Ambulatory Visit: Payer: Self-pay | Admitting: Internal Medicine

## 2014-04-04 MED ORDER — FLUTICASONE PROPIONATE 0.05 % EX CREA: TOPICAL_CREAM | CUTANEOUS | Status: DC

## 2014-04-04 MED ORDER — LISINOPRIL 20 MG PO TABS: ORAL_TABLET | ORAL | Status: DC

## 2014-04-04 MED ORDER — MAGNESIUM OXIDE 400 MG PO TABS: 1.0000 | ORAL_TABLET | Freq: Every day | ORAL | Status: DC

## 2014-04-04 NOTE — Telephone Encounter (Signed)
Ok refill? 

## 2014-04-04 NOTE — Telephone Encounter (Signed)
rx sent in for cutivate cream.

## 2014-04-12 ENCOUNTER — Other Ambulatory Visit: Payer: Self-pay | Admitting: Internal Medicine

## 2014-04-12 MED ORDER — OMEPRAZOLE 20 MG PO CPDR: 20.0000 mg | DELAYED_RELEASE_CAPSULE | Freq: Two times a day (BID) | ORAL | Status: DC

## 2014-04-12 MED ORDER — ATENOLOL 100 MG PO TABS: ORAL_TABLET | ORAL | Status: DC

## 2014-04-12 MED ORDER — HYDROCHLOROTHIAZIDE 25 MG PO TABS: 25.0000 mg | ORAL_TABLET | Freq: Every day | ORAL | Status: DC

## 2014-04-14 ENCOUNTER — Encounter: Payer: Self-pay | Admitting: *Deleted

## 2014-04-14 ENCOUNTER — Telehealth: Payer: Self-pay | Admitting: Internal Medicine

## 2014-04-14 NOTE — Telephone Encounter (Signed)
Pt has been placed on your schedule tomorrow.

## 2014-04-14 NOTE — Telephone Encounter (Signed)
Thanks

## 2014-04-14 NOTE — Telephone Encounter (Signed)
I left patient a voicemail to notify her that I have moved up appt tomorrow to 8:30 with Dr. Lorin PicketScott. Will send a mychart message also.

## 2014-04-14 NOTE — Telephone Encounter (Signed)
[facility] [place] [facility] - [facility] TELEPHONE ADVICE RECORD   [facility]     Patient Name: Ala DachMICKIE Teas   DOB: Dec 01, 1945      Nurse Assessment  Nurse: Elijah Birkaldwell, RN, Lynda Date/Time (Eastern Time): 04/14/2014 10:45:14 AM  Confirm and document reason for call. If symptomatic, describe symptoms. ---Caller states she has been sick since the week before Christmas, doesn't seem like she is getting better. Has a cough, but not bringing anything up, achy, at night she gets a low grade temp. 99-100 and just not feeling good. Has had discomfort between shoulders in back at times.   Has the patient traveled out of the country within the last 30 days? ---Not Applicable   Does the patient require triage? ---Yes   Related visit to physician within the last 2 weeks? ---No   Does the PT have any chronic conditions? (i.e. diabetes, asthma, etc.) ---Yes   List chronic conditions. ---diabetes, BP rx     Guidelines     Guideline Title Affirmed Question Affirmed Notes   Cough - Acute Non-Productive [1] Continuous (nonstop) coughing interferes with work or school AND [2] no improvement using cough treatment per protocol    Final Disposition User   See Physician within [address] [place], Charity fundraiserN, [place]

## 2014-04-15 ENCOUNTER — Ambulatory Visit: Payer: Self-pay | Admitting: Nurse Practitioner

## 2014-04-15 ENCOUNTER — Encounter: Payer: Self-pay | Admitting: Internal Medicine

## 2014-04-15 ENCOUNTER — Ambulatory Visit (INDEPENDENT_AMBULATORY_CARE_PROVIDER_SITE_OTHER): Payer: PPO | Admitting: Internal Medicine

## 2014-04-15 VITALS — BP 120/60 | HR 60 | Temp 98.2°F | Ht 61.5 in | Wt 232.8 lb

## 2014-04-15 DIAGNOSIS — J069 Acute upper respiratory infection, unspecified: Secondary | ICD-10-CM

## 2014-04-15 DIAGNOSIS — E1140 Type 2 diabetes mellitus with diabetic neuropathy, unspecified: Secondary | ICD-10-CM

## 2014-04-15 DIAGNOSIS — I10 Essential (primary) hypertension: Secondary | ICD-10-CM

## 2014-04-15 MED ORDER — BENZONATATE 100 MG PO CAPS: 100.0000 mg | ORAL_CAPSULE | Freq: Three times a day (TID) | ORAL | Status: DC | PRN

## 2014-04-15 MED ORDER — FLUTICASONE PROPIONATE HFA 110 MCG/ACT IN AERO: 2.0000 | INHALATION_SPRAY | Freq: Two times a day (BID) | RESPIRATORY_TRACT | Status: DC

## 2014-04-15 MED ORDER — AZITHROMYCIN 250 MG PO TABS: ORAL_TABLET | ORAL | Status: DC

## 2014-04-15 NOTE — Progress Notes (Signed)
Pre visit review using our clinic review tool, if applicable. No additional management support is needed unless otherwise documented below in the visit note. 

## 2014-04-15 NOTE — Patient Instructions (Addendum)
Saline nasal spray - flush nose at least 2-3x/day  nasacort nasal spray - 2 sprays each nostril one time per day.  Do this in the evening.    Robitussin as directed.    Use the inhaler twice a day.  Rinse mouth after use.    Take the antibiotic as directed.

## 2014-04-17 ENCOUNTER — Encounter: Payer: Self-pay | Admitting: Internal Medicine

## 2014-04-17 NOTE — Progress Notes (Signed)
Subjective:    Patient ID: Beth Tyler, female    DOB: 1945/05/08, 69 y.o.   MRN: [id number]  Cough  69 year old female with past history of diabetes, hypertension and hypercholesterolemia who comes in today as a work in with concerns regarding persistent cough and congestion.  States symptoms started on 03/25/14.  Aching.  Sore throat previously.  No sore throat now.   Increased drainage.  Low grade temp - 99.  No chest tightness.  No nausea or vomiting.  No diarrhea.   Increased cough and congestion.  Taking tylenol.  AM sugars 200 and pm sugars 165-225.     Past Medical History  Diagnosis Date  . Diabetes mellitus, type II   . Hypertension   . Hypercholesterolemia   . GERD (gastroesophageal reflux disease)   . IBS (irritable bowel syndrome)   . Gastritis   . B12 deficiency   . Arthritis   . Diabetes mellitus 02/17/2012  . Anemia   . Pernicious anemia     Current Outpatient Prescriptions on File Prior to Visit  Medication Sig Dispense Refill  . amLODipine (NORVASC) 10 MG tablet TAKE ONE TABLET BY MOUTH ONCE DAILY 90 tablet 1  . aspirin 81 MG tablet Take 81 mg by mouth daily.    Marland Kitchen. atenolol (TENORMIN) 100 MG tablet TAKE ONE TABLET BY MOUTH IN THE MORNING AND ONE-HALF IN THE EVENING 135 tablet 1  . cyanocobalamin (,VITAMIN B-12,) 1000 MCG/ML injection Inject 1,000 mcg into the muscle every 30 (thirty) days.    . ferrous fumarate (HEMOCYTE - 106 MG FE) 325 (106 FE) MG TABS tablet Take 1 tablet by mouth.    . fluticasone (CUTIVATE) 0.05 % cream Use as directed. 30 g 0  . glipiZIDE (GLUCOTROL XL) 5 MG 24 hr tablet Take 1 tablet (5 mg total) by mouth daily with breakfast. 90 tablet 1  . hydrochlorothiazide (HYDRODIURIL) 25 MG tablet Take 1 tablet (25 mg total) by mouth daily. 90 tablet 1  . insulin glargine (LANTUS) 100 UNIT/ML injection Inject 16 Units into the skin daily.    . insulin lispro ([name]) 100 UNIT/ML injection Inject 10 Units into the skin daily. Take 10 units before  lunch    . lisinopril (PRINIVIL,ZESTRIL) 20 MG tablet TAKE TWO TABLETS BY MOUTH IN THE MORNING AND ONE IN THE EVENING 90 tablet 5  . lisinopril (PRINIVIL,ZESTRIL) 20 MG tablet Take one tablet in the am and two tablets in the pm 270 tablet 1  . magnesium oxide (MAG-OX) 400 MG tablet Take 1 tablet (400 mg total) by mouth daily. 90 tablet 1  . metFORMIN (GLUCOPHAGE) 1000 MG tablet TAKE ONE TABLET BY MOUTH TWICE DAILY WITH MEALS 180 tablet 1  . omeprazole (PRILOSEC) 20 MG capsule Take 1 capsule (20 mg total) by mouth 2 (two) times daily. 180 capsule 1  . pioglitazone (ACTOS) 30 MG tablet TAKE ONE TABLET BY MOUTH ONCE DAILY 90 tablet 1   No current facility-administered medications on file prior to visit.    Review of Systems  Respiratory: Positive for cough.   Patient denies any headache, lightheadedness or dizziness.  Increased congestion and drainage.  Sore throat is better.  No chest pain, tightness or palpitations.  No increased shortness of breath.  Does report increased cough and congestion.  No nausea or vomiting.  No acid reflux. No abdominal pain or cramping.  Bowels are stable.  Sugars as outlined.         Objective:   Physical Exam  Filed Vitals:   04/15/14 0827  BP: 120/60  Pulse: 60  Temp: 98.2 F (39.55 C)   69 year old female in no acute distress.   HEENT:  Nares- slightly erythematous turbinates.  Oropharynx - without lesions.  No tenderness to palpation over the sinuses.   NECK:  Supple.  Nontender.  HEART:  Appears to be regular. LUNGS:  No crackles or wheezing audible.  Respirations even and unlabored.  Increased cough forced expiration.             Assessment & Plan:  1. URI (upper respiratory infection) Treat with zpak as directed.  Saline nasal spray and nasacort nasal spray as directed.  Robitussin as directed.  Flovent inhaler and albuterol inhaler as directed.  Hold on oral prednisone.  Tessalon perles as needed.  Follow.    2. Type 2 diabetes mellitus with  diabetic neuropathy Sugars as outlined.  Follow.    3. Essential hypertension Blood pressure has been doing well.  Follow.

## 2014-04-28 ENCOUNTER — Ambulatory Visit (INDEPENDENT_AMBULATORY_CARE_PROVIDER_SITE_OTHER): Payer: PPO | Admitting: *Deleted

## 2014-04-28 DIAGNOSIS — E538 Deficiency of other specified B group vitamins: Secondary | ICD-10-CM

## 2014-04-28 MED ORDER — CYANOCOBALAMIN 1000 MCG/ML IJ SOLN
1000.0000 ug | Freq: Once | INTRAMUSCULAR | Status: AC
  Administered 2014-04-28: 1000 ug via INTRAMUSCULAR

## 2014-05-21 ENCOUNTER — Other Ambulatory Visit: Payer: Self-pay | Admitting: Internal Medicine

## 2014-05-26 ENCOUNTER — Encounter: Payer: Self-pay | Admitting: Internal Medicine

## 2014-06-07 ENCOUNTER — Ambulatory Visit: Payer: PPO

## 2014-06-08 ENCOUNTER — Ambulatory Visit (INDEPENDENT_AMBULATORY_CARE_PROVIDER_SITE_OTHER): Payer: PPO | Admitting: *Deleted

## 2014-06-08 DIAGNOSIS — E538 Deficiency of other specified B group vitamins: Secondary | ICD-10-CM

## 2014-06-08 MED ORDER — CYANOCOBALAMIN 1000 MCG/ML IJ SOLN
1000.0000 ug | Freq: Once | INTRAMUSCULAR | Status: AC
  Administered 2014-06-08: 1000 ug via INTRAMUSCULAR

## 2014-06-16 ENCOUNTER — Other Ambulatory Visit: Payer: Self-pay | Admitting: Internal Medicine

## 2014-07-07 ENCOUNTER — Telehealth: Payer: Self-pay | Admitting: *Deleted

## 2014-07-07 ENCOUNTER — Other Ambulatory Visit (INDEPENDENT_AMBULATORY_CARE_PROVIDER_SITE_OTHER): Payer: PPO

## 2014-07-07 DIAGNOSIS — E538 Deficiency of other specified B group vitamins: Secondary | ICD-10-CM

## 2014-07-07 DIAGNOSIS — E1140 Type 2 diabetes mellitus with diabetic neuropathy, unspecified: Secondary | ICD-10-CM

## 2014-07-07 DIAGNOSIS — E7800 Pure hypercholesterolemia, unspecified: Secondary | ICD-10-CM

## 2014-07-07 DIAGNOSIS — I10 Essential (primary) hypertension: Secondary | ICD-10-CM

## 2014-07-07 LAB — LIPID PANEL
CHOLESTEROL: 114 mg/dL (ref 0–200)
HDL: 44.8 mg/dL (ref >39.00)
LDL Cholesterol: 40 mg/dL (ref 0–99)
NonHDL: 69.2
Total CHOL/HDL Ratio: 3
Triglycerides: 147 mg/dL (ref 0.0–149.0)
VLDL: 29.4 mg/dL (ref 0.0–40.0)

## 2014-07-07 LAB — HEPATIC FUNCTION PANEL
ALK PHOS: 76 U/L (ref 39–117)
ALT: 19 U/L (ref 0–35)
AST: 14 U/L (ref 0–37)
Albumin: 4 g/dL (ref 3.5–5.2)
BILIRUBIN TOTAL: 0.5 mg/dL (ref 0.2–1.2)
Bilirubin, Direct: 0.1 mg/dL (ref 0.0–0.3)
TOTAL PROTEIN: 6.5 g/dL (ref 6.0–8.3)

## 2014-07-07 LAB — CBC WITH DIFFERENTIAL/PLATELET
Basophils Absolute: 0.1 10*3/uL (ref 0.0–0.1)
Basophils Relative: 0.6 % (ref 0.0–3.0)
Eosinophils Absolute: 0.3 10*3/uL (ref 0.0–0.7)
Eosinophils Relative: 3.6 % (ref 0.0–5.0)
HEMATOCRIT: 38.4 % (ref 36.0–46.0)
Hemoglobin: 12.6 g/dL (ref 12.0–15.0)
LYMPHS ABS: 2.9 10*3/uL (ref 0.7–4.0)
LYMPHS PCT: 32.9 % (ref 12.0–46.0)
MCHC: 32.9 g/dL (ref 30.0–36.0)
MCV: 86.2 fl (ref 78.0–100.0)
MONO ABS: 0.6 10*3/uL (ref 0.1–1.0)
Monocytes Relative: 7.1 % (ref 3.0–12.0)
NEUTROS PCT: 55.8 % (ref 43.0–77.0)
Neutro Abs: 5 10*3/uL (ref 1.4–7.7)
PLATELETS: 217 10*3/uL (ref 150.0–400.0)
RBC: 4.45 Mil/uL (ref 3.87–5.11)
RDW: 14.3 % (ref 11.5–15.5)
WBC: 8.9 10*3/uL (ref 4.0–10.5)

## 2014-07-07 LAB — BASIC METABOLIC PANEL
BUN: 18 mg/dL (ref 6–23)
CALCIUM: 9.5 mg/dL (ref 8.4–10.5)
CO2: 30 mEq/L (ref 19–32)
CREATININE: 0.86 mg/dL (ref 0.40–1.20)
Chloride: 105 mEq/L (ref 96–112)
GFR: 69.38 mL/min (ref >60.00)
Glucose, Bld: 215 mg/dL — ABNORMAL HIGH (ref 70–99)
Potassium: 5.1 mEq/L (ref 3.5–5.1)
Sodium: 140 mEq/L (ref 135–145)

## 2014-07-07 LAB — HEMOGLOBIN A1C: Hgb A1c MFr Bld: 10.4 % — ABNORMAL HIGH (ref 4.6–6.5)

## 2014-07-07 NOTE — Telephone Encounter (Signed)
Orders placed for labs

## 2014-07-07 NOTE — Telephone Encounter (Signed)
Labs and dx?  

## 2014-07-08 ENCOUNTER — Encounter: Payer: Self-pay | Admitting: Internal Medicine

## 2014-07-08 DIAGNOSIS — E119 Type 2 diabetes mellitus without complications: Secondary | ICD-10-CM

## 2014-07-09 NOTE — Telephone Encounter (Signed)
Order placed for endocrinology referral.  

## 2014-07-12 ENCOUNTER — Encounter: Payer: Self-pay | Admitting: Endocrinology

## 2014-07-12 ENCOUNTER — Ambulatory Visit (INDEPENDENT_AMBULATORY_CARE_PROVIDER_SITE_OTHER): Payer: PPO | Admitting: Internal Medicine

## 2014-07-12 ENCOUNTER — Ambulatory Visit (INDEPENDENT_AMBULATORY_CARE_PROVIDER_SITE_OTHER): Payer: PPO | Admitting: Endocrinology

## 2014-07-12 ENCOUNTER — Encounter: Payer: Self-pay | Admitting: Internal Medicine

## 2014-07-12 VITALS — BP 138/76 | HR 62 | Resp 14 | Ht 61.5 in | Wt 236.5 lb

## 2014-07-12 VITALS — BP 138/76 | HR 62 | Temp 98.1°F | Resp 14 | Ht 61.5 in | Wt 236.0 lb

## 2014-07-12 DIAGNOSIS — Z1211 Encounter for screening for malignant neoplasm of colon: Secondary | ICD-10-CM

## 2014-07-12 DIAGNOSIS — I10 Essential (primary) hypertension: Secondary | ICD-10-CM

## 2014-07-12 DIAGNOSIS — E7800 Pure hypercholesterolemia, unspecified: Secondary | ICD-10-CM

## 2014-07-12 DIAGNOSIS — E1140 Type 2 diabetes mellitus with diabetic neuropathy, unspecified: Secondary | ICD-10-CM | POA: Diagnosis not present

## 2014-07-12 DIAGNOSIS — Z658 Other specified problems related to psychosocial circumstances: Secondary | ICD-10-CM

## 2014-07-12 DIAGNOSIS — K589 Irritable bowel syndrome without diarrhea: Secondary | ICD-10-CM

## 2014-07-12 DIAGNOSIS — F439 Reaction to severe stress, unspecified: Secondary | ICD-10-CM

## 2014-07-12 DIAGNOSIS — E538 Deficiency of other specified B group vitamins: Secondary | ICD-10-CM

## 2014-07-12 DIAGNOSIS — Z0000 Encounter for general adult medical examination without abnormal findings: Secondary | ICD-10-CM

## 2014-07-12 DIAGNOSIS — E875 Hyperkalemia: Secondary | ICD-10-CM

## 2014-07-12 DIAGNOSIS — R079 Chest pain, unspecified: Secondary | ICD-10-CM | POA: Diagnosis not present

## 2014-07-12 MED ORDER — CANAGLIFLOZIN 100 MG PO TABS: 100.0000 mg | ORAL_TABLET | Freq: Every day | ORAL | Status: DC

## 2014-07-12 MED ORDER — CYANOCOBALAMIN 1000 MCG/ML IJ SOLN
1000.0000 ug | Freq: Once | INTRAMUSCULAR | Status: AC
  Administered 2014-07-12: 1000 ug via INTRAMUSCULAR

## 2014-07-12 NOTE — Progress Notes (Signed)
Pre visit review using our clinic review tool, if applicable. No additional management support is needed unless otherwise documented below in the visit note. 

## 2014-07-12 NOTE — Progress Notes (Signed)
Patient ID: Beth Tyler, female   DOB: June 08, 1945, 69 y.o.   MRN: [id number]   Subjective:    Patient ID: Beth Tyler, female    DOB: June 08, 1945, 69 y.o.   MRN: [id number]  HPI  Patient here for a scheduled follow up.  She is doing some better now.  Increased stress recently.  Son had three surgeries (life threatening) - in March.  Daughter-n-law passed 05/14/14 - 70 years old.  Two best friends died in March.  She is also taking care of her husband - dialysis 3x/week.  She is now back in more of a regular routine.  Sugars are improving.  See attached list.  AM sugars now averaging 113-150s and PM sugars 140-160s.  She reports "indigestion" - on two separate occasions.  Gets winded with stairs.  Some exertional chest pain.  The two episodes lasted approximately 20-25 minutes.  Took aspirin.  No pain now.  No increased cough or congestion.  Bowels stable.  Her blood pressure checks averaging - 126/76.   Past Medical History  Diagnosis Date  . Diabetes mellitus, type II   . Hypertension   . Hypercholesterolemia   . GERD (gastroesophageal reflux disease)   . IBS (irritable bowel syndrome)   . Gastritis   . B12 deficiency   . Arthritis   . Diabetes mellitus 02/17/2012  . Anemia   . Pernicious anemia     Current Outpatient Prescriptions on File Prior to Visit  Medication Sig Dispense Refill  . aspirin 81 MG tablet Take 81 mg by mouth daily.    Marland Kitchen. atenolol (TENORMIN) 100 MG tablet TAKE ONE TABLET BY MOUTH IN THE MORNING AND ONE-HALF IN THE EVENING 135 tablet 1  . cyanocobalamin (,VITAMIN B-12,) 1000 MCG/ML injection Inject 1,000 mcg into the muscle every 30 (thirty) days.    . ferrous fumarate (HEMOCYTE - 106 MG FE) 325 (106 FE) MG TABS tablet Take 1 tablet by mouth.    . fluticasone (CUTIVATE) 0.05 % cream Use as directed. 30 g 0  . fluticasone (FLOVENT HFA) 110 MCG/ACT inhaler Inhale 2 puffs into the lungs 2 (two) times daily. Rinse mouth after use 1 Inhaler 0  . hydrochlorothiazide  (HYDRODIURIL) 25 MG tablet Take 1 tablet (25 mg total) by mouth daily. 90 tablet 1  . insulin glargine (LANTUS) 100 UNIT/ML injection Inject 16 Units into the skin daily.    . insulin lispro ([name]) 100 UNIT/ML injection Inject 10 Units into the skin daily. Take 10 units before lunch    . lisinopril (PRINIVIL,ZESTRIL) 20 MG tablet TAKE TWO TABLETS BY MOUTH IN THE MORNING AND ONE IN THE EVENING 90 tablet 5  . magnesium oxide (MAG-OX) 400 MG tablet Take 1 tablet (400 mg total) by mouth daily. 90 tablet 1  . metFORMIN (GLUCOPHAGE) 1000 MG tablet TAKE ONE TABLET BY MOUTH TWICE DAILY WITH MEALS 180 tablet 0  . omeprazole (PRILOSEC) 20 MG capsule Take 1 capsule (20 mg total) by mouth 2 (two) times daily. 180 capsule 1  . pioglitazone (ACTOS) 30 MG tablet TAKE ONE TABLET BY MOUTH ONCE DAILY 90 tablet 0  . simvastatin (ZOCOR) 10 MG tablet TAKE ONE TABLET BY MOUTH AT BEDTIME 90 tablet 0   No current facility-administered medications on file prior to visit.    Review of Systems  Constitutional: Negative for appetite change and unexpected weight change.  HENT: Negative for congestion and sinus pressure.   Respiratory: Positive for shortness of breath (reports sob with exertion. ). Negative  for cough.   Cardiovascular: Positive for chest pain (some discomfort with exertion.  had two episodes of "indigestion".  took aspirin.  lasted approximately 20-25 minutes.  ). Negative for palpitations and leg swelling (no change).  Gastrointestinal: Negative for nausea, vomiting and abdominal pain.       Bowels stable.    Genitourinary: Negative for dysuria and difficulty urinating.  Musculoskeletal:       Some leg pain.  Stable.   Skin: Negative for color change and rash.  Neurological: Negative for dizziness, light-headedness and headaches.  Psychiatric/Behavioral: Negative for agitation.       Increased stress as outlined.         Objective:     Blood pressure recheck:  138/74  Physical Exam    Constitutional: She appears well-developed and well-nourished. No distress.  HENT:  Nose: Nose normal.  Mouth/Throat: Oropharynx is clear and moist.  Neck: Neck supple. No thyromegaly present.  Cardiovascular: Normal rate and regular rhythm.   Pulmonary/Chest: Breath sounds normal. No respiratory distress. She has no wheezes.  Abdominal: Soft. Bowel sounds are normal. There is no tenderness.  Musculoskeletal: She exhibits no edema or tenderness.  Lymphadenopathy:    She has no cervical adenopathy.  Skin: No rash noted. No erythema.  Psychiatric: She has a normal mood and affect. Her behavior is normal.    BP 138/76 mmHg  Pulse 62  Temp(Src) 98.1 F (36.7 C) (Oral)  Resp 14  Ht 5' 1.5" (1.562 m)  Wt 236 lb (107.049 kg)  BMI 43.88 kg/m2  SpO2 96% Wt Readings from Last 3 Encounters:  07/12/14 236 lb (107.049 kg)  07/12/14 236 lb 8 oz (107.276 kg)  04/15/14 232 lb 12 oz (105.575 kg)     Lab Results  Component Value Date   WBC 8.9 07/07/2014   HGB 12.6 07/07/2014   HCT 38.4 07/07/2014   PLT 217.0 07/07/2014   GLUCOSE 215* 07/07/2014   CHOL 114 07/07/2014   TRIG 147.0 07/07/2014   HDL 44.80 07/07/2014   LDLDIRECT 74.4 02/21/2012   LDLCALC 40 07/07/2014   ALT 19 07/07/2014   AST 14 07/07/2014   NA 140 07/07/2014   K 5.1 07/07/2014   CL 105 07/07/2014   CREATININE 0.86 07/07/2014   BUN 18 07/07/2014   CO2 30 07/07/2014   TSH 1.70 01/19/2014   HGBA1C 10.4* 07/07/2014   MICROALBUR 1.3 10/01/2013       Assessment & Plan:   Problem List Items Addressed This Visit    B12 deficiency    Continue b12 injections.       Relevant Medications   cyanocobalamin ((VITAMIN B-12)) injection 1,000 mcg (Completed)   Chest pain - Primary    Chest pain as outlined.  EKG obtained and revealed SR with no acute ischemic changes.  Refer to cardiology as outlined.  Further w/up pending cardiology evaluation.        Relevant Orders   EKG 12-Lead (Completed)   Ambulatory  referral to Cardiology   Colon cancer screening    colonoscopy 2013.  Recommended f/u colonoscopy in 10 years.       Diabetes mellitus    Saw Dr Welford Roche this am.  Sugars better as outlined.  In more of a routine now.  Continue diet adjustment.  Follow.        Health care maintenance    Physical 01/19/14.  Mammogram 01/27/14 - Birads I.  Colonoscopy 11/29/11 - one rectal polyp and diverticulosis.  Hypercholesterolemia    On simvastatin.  Low cholesterol diet and exercise.  Follow lipid panel and liver function tests.        Hyperkalemia    Follow potassium level.  Will have to watch on invokana.  May need to adjust medication.        Hypertension    Blood pressure doing well.  Same medication regimen.  Follow pressures.  Follow metabolic panel.        IBS (irritable bowel syndrome)    Colonoscopy 2013 as outlined.  Stable.  Was increased with the increased stress, but now stable.        Stress    Increased stress as outlined.  See above.  Does not feel she needs anything more at this point.  Follow.          I spent 25 minutes with the patient and more than 50% of the time was spent in consultation regarding the above.     Dale , MD

## 2014-07-12 NOTE — Assessment & Plan Note (Signed)
BP at target on current therapy, being monitored by Dr Lorin PicketScott. Urine MA normal June 2015.

## 2014-07-12 NOTE — Patient Instructions (Signed)
Work on better diet and start walking for exercise.  Check sugars 3 times daily ( fasting and premeal readings at alternating times, or at bedtime).  Record them in a sugar log and bring log and meter to next appointment.   Continue current actos, metformin, lantus and humalog.  Stop glipizide.  Start Invokana at 100 mg daily with breakfast.  Watch intake of your fruits and tomatoes.  Stay hydrated with water.  If develop yeast infection or UTI, report back to PCP. Return in 1 week after start of Invokana for non fasting blood sample.  Please come back for a follow-up appointment in 1 month.

## 2014-07-12 NOTE — Assessment & Plan Note (Signed)
Recent A1c not at target, likely from recent stressful events.  Sugars are getting better after recent modifications to diet. I have asked her to avoid any sauces with her pasta, limit portion sizes, add a side salad etc to lower carb intake.  Asked her to try add a walk to her routine.   Check sugars 2-3 x daily.  Discussed each of her DM medications in detail. Dicussed warnings with Actos and associated fluid retention.  Discussed risk of hypoglycemia, weight gain with SU.  We discussed addition of newer medications like GLP-1, SGLT2 to her regimen. She is agreeable to try SGLT2 therapy at this time.  Discussed risk profile and common side effects.  She will start Invokana 100 mg daily. Return in 1 week for BMP check. If her K levels continue to stay normal, then she can assess dose modifying HCTZ/ACE-I over next few visits to keep K/BP in normal range. This will be done in concert with Dr Lorin PicketScott.  Stop Glipizide. Continue current Actos, Metformin, Lantus and [name] for now.  Over time, could introduce GLP-1 and try withdrawing [name]/Actos.  Foot care, eye exams, hypoglycemia, nutrition discussed.

## 2014-07-12 NOTE — Progress Notes (Signed)
Reason for visit-  Beth Tyler is a 69 y.o.-year-old female, referred by her PCP,  Dale Wasta, MD for management of Type 2 diabetes, uncontrolled, with complications ( ?developing Stage 2 CKD, neuropathy). Dr Gerri Lins , prior PCP,  was managing the DM in the past. She hasn't seen endo before.    HPI- Patient has been diagnosed with diabetes since age 75. Recalls being initially on lifestyle modifications.  Tried  Metformin, Glipizide/Glyburide, Actos. she hast been on insulin since 2 years.   *has been compliant with insulin therapy since past 6-8 months only *underwent a very stressful time with the family members health Feb ( son needed 3 life threatening surgeries, DIL passed away, her husband was in the hospital and now is on HD, 2 friends were very sick too) *Switched from Glyburide to Glipizide recently due to insurance  Pt is currently on a regimen of: - Metformin 1000 mg po bid - Glipizide 5 mg daily ( start March 2016) - Actos 30 mg daily ( been on it for 20 years, did not tolerate higher dose due to edema) - Lantus 18 units qam - [name] 10 units at lunch around 3pm  Last hemoglobin A1c was: Lab Results  Component Value Date   HGBA1C 10.4* 07/07/2014   HGBA1C 9.1* 01/19/2014   HGBA1C 7.6* 10/01/2013     Pt checks her sugars 2-3 a day . Finishing up Prodigy- switching to One Touch glucometer. By sugar log they are:  PREMEAL Breakfast Lunch Dinner Bedtime Overall  Glucose range: 148-230 142-155 121-155 161-222   Mean/median:        POST-MEAL PC Breakfast PC Lunch PC Dinner  Glucose range:     Mean/median:      *higher sugars with pasta intake  Hypoglycemia-  No lows. Lowest sugar was 91; she has hypoglycemia awareness at 69.   Dietary habits- eats two -three times daily-  late with lunch - trying to eat lunch around noon, is in the habit of eating at 3pm , because that was when she got off work. Now recent retirement. Tries to limit sweetened  beverages, sodas, desserts. Quit fried foods and fast foods few years ago. Loves carbs( pasta 2 weekly with marinara sauce) Exercise- no, has knee pain, tries to walk with walker. Also gets some Sob with exercise Weight - relatively stable Wt Readings from Last 3 Encounters:  07/12/14 236 lb (107.049 kg)  07/12/14 236 lb 8 oz (107.276 kg)  04/15/14 232 lb 12 oz (105.575 kg)    Diabetes Complications-  Nephropathy- No, ?developing Stage 2  CKD, last BUN/creatinine- GFR 66-69 Lab Results  Component Value Date   BUN 18 07/07/2014   CREATININE 0.86 07/07/2014   Lab Results  Component Value Date   GFR 69.38 07/07/2014   MICRALBCREAT 2.4 10/01/2013     Retinopathy- No, Last DEE was in 2016 after being seen by a specialist Neuropathy- has numbness and tingling in )her feet. Known neuropathy- uses topical cream with good effect for pain on right outer side of foot.   Associated history - No CAD . No prior stroke. No hypothyroidism. her last TSH was  Lab Results  Component Value Date   TSH 1.70 01/19/2014    Hyperlipidemia-  her last set of lipids were- Currently on Zocor 10 mg daily. Tolerating well.   Lab Results  Component Value Date   CHOL 114 07/07/2014   HDL 44.80 07/07/2014   LDLCALC 40 07/07/2014   LDLDIRECT 74.4 02/21/2012  TRIG 147.0 07/07/2014   CHOLHDL 3 07/07/2014    Blood Pressure/HTN- Patient's blood pressure is well controlled today on current regimen that includes ACE-I ( lisinopril). Hx hyperKalemia, most recent K normal. Also taking HCTZ and CCB.   Lab Results  Component Value Date   K 5.1 07/07/2014   K 4.8 01/19/2014   K 5.0 11/17/2013     Pt has FH of DM in most family members ( parents, siblings, children, Grandparents, aunts and uncles).  I have reviewed the patient's past medical history, family and social history, surgical history, medications and allergies.  Past Medical History  Diagnosis Date  . Diabetes mellitus, type II   . Hypertension    . Hypercholesterolemia   . GERD (gastroesophageal reflux disease)   . IBS (irritable bowel syndrome)   . Gastritis   . B12 deficiency   . Arthritis   . Diabetes mellitus 02/17/2012  . Anemia   . Pernicious anemia    Past Surgical History  Procedure Laterality Date  . Breast surgery  1980    benign cyst removal  . Cholecystectomy  1983   Family History  Problem Relation Age of Onset  . Diabetes Father   . Kidney disease Father   . Heart disease Father   . Alcohol abuse Mother   . Dementia Mother   . Diabetes Mother   . Hemophilia Brother   . Uterine cancer Sister   . Diabetes Sister   . COPD Sister   . Breast cancer Maternal Aunt   . Diabetes Other    History   Social History  . Marital Status: Married    Spouse Name: N/A  . Number of Children: 4  . Years of Education: N/A   Occupational History  . Not on file.   Social History Main Topics  . Smoking status: Never Smoker   . Smokeless tobacco: Never Used  . Alcohol Use: No  . Drug Use: No  . Sexual Activity: Not on file   Other Topics Concern  . Not on file   Social History Narrative   Lives with her husband. Has four children. Manager of an apartment building. No tobacco, alcohol or other drug use   Current Outpatient Prescriptions on File Prior to Visit  Medication Sig Dispense Refill  . amLODipine (NORVASC) 10 MG tablet TAKE ONE TABLET BY MOUTH ONCE DAILY 90 tablet 1  . aspirin 81 MG tablet Take 81 mg by mouth daily.    Marland Kitchen. atenolol (TENORMIN) 100 MG tablet TAKE ONE TABLET BY MOUTH IN THE MORNING AND ONE-HALF IN THE EVENING 135 tablet 1  . cyanocobalamin (,VITAMIN B-12,) 1000 MCG/ML injection Inject 1,000 mcg into the muscle every 30 (thirty) days.    . ferrous fumarate (HEMOCYTE - 106 MG FE) 325 (106 FE) MG TABS tablet Take 1 tablet by mouth.    . fluticasone (CUTIVATE) 0.05 % cream Use as directed. 30 g 0  . fluticasone (FLOVENT HFA) 110 MCG/ACT inhaler Inhale 2 puffs into the lungs 2 (two) times  daily. Rinse mouth after use 1 Inhaler 0  . hydrochlorothiazide (HYDRODIURIL) 25 MG tablet Take 1 tablet (25 mg total) by mouth daily. 90 tablet 1  . insulin glargine (LANTUS) 100 UNIT/ML injection Inject 16 Units into the skin daily.    . insulin lispro ([name]) 100 UNIT/ML injection Inject 10 Units into the skin daily. Take 10 units before lunch    . lisinopril (PRINIVIL,ZESTRIL) 20 MG tablet TAKE TWO TABLETS BY MOUTH IN THE  MORNING AND ONE IN THE EVENING 90 tablet 5  . magnesium oxide (MAG-OX) 400 MG tablet Take 1 tablet (400 mg total) by mouth daily. 90 tablet 1  . metFORMIN (GLUCOPHAGE) 1000 MG tablet TAKE ONE TABLET BY MOUTH TWICE DAILY WITH MEALS 180 tablet 0  . omeprazole (PRILOSEC) 20 MG capsule Take 1 capsule (20 mg total) by mouth 2 (two) times daily. 180 capsule 1  . pioglitazone (ACTOS) 30 MG tablet TAKE ONE TABLET BY MOUTH ONCE DAILY 90 tablet 0  . simvastatin (ZOCOR) 10 MG tablet TAKE ONE TABLET BY MOUTH AT BEDTIME 90 tablet 0   No current facility-administered medications on file prior to visit.   Allergies  Allergen Reactions  . Hydrocodone-Acetaminophen Anaphylaxis  . Percocet [Oxycodone-Acetaminophen] Anaphylaxis  . Penicillins Nausea Only  . Tetracyclines & Related      Review of Systems:  complains of  [  ] denies General:   [  ] Recent weight change [  ] Fatigue  [  ] Loss of appetite Eyes: [  ]  Vision Difficulty [  ]  Eye pain ENT: [  ]  Hearing difficulty [  ]  Difficulty Swallowing CVS: [  ] Chest pain [  ]  Palpitations/Irregular Heart beat [  ]  Shortness of breath lying flat [ x ] Swelling of legs Resp: [  ] Frequent Cough [  ] Shortness of Breath  [  ]  Wheezing GI: [  ] Heartburn  [  ] Nausea or Vomiting  [ x ] Diarrhea [  ] Constipation  [  ] Abdominal Pain GU: [  ]  Polyuria  [  ]  nocturia Bones/joints:  [ x ]  Muscle aches  [ x ] Joint Pain  [  ] Bone pain Skin/Hair/Nails: [  ]  Rash  [  ] New stretch marks [ x ]  Itching [  ] Hair loss [  ]   Excessive hair growth Reproduction: [  ] Low sexual desire , [  ]  Women: Menstrual cycle problems [  ]  Women: Breast Discharge [  ] Men: Difficulty with erections [  ]  Men: Enlarged Breasts CNS: [ x ] Frequent Headaches [  ] Blurry vision [  ] Tremors [  ] Seizures [  ] Loss of consciousness [  ] Localized weakness Endocrine: [ x ]  Excess thirst [  ]  Feeling excessively hot [  ]  Feeling excessively cold Heme: [  ]  Easy bruising [  ]  Enlarged glands or lumps in neck Allergy: [  ]  Food allergies [  ] Environmental allergies  PE: BP 138/76 mmHg  Pulse 62  Resp 14  Ht 5' 1.5" (1.562 m)  Wt 236 lb 8 oz (107.276 kg)  BMI 43.97 kg/m2  SpO2 98% Wt Readings from Last 3 Encounters:  07/12/14 236 lb (107.049 kg)  07/12/14 236 lb 8 oz (107.276 kg)  04/15/14 232 lb 12 oz (105.575 kg)   GENERAL: No acute distress, well developed HEENT:  Eye exam shows normal external appearance. Oral exam shows normal mucosa .  NECK:   Neck exam shows no lymphadenopathy. No Carotids bruits. Thyroid is not enlarged and no nodules felt.  no acanthosis nigricans LUNGS:         Chest is symmetrical. Lungs are clear to auscultation.Marland Kitchen   HEART:         Heart sounds:  S1 and S2 are normal. No murmurs or  clicks heard. ABDOMEN:  No Distention present. Liver and spleen are not palpable. No other mass or tenderness present.  EXTREMITIES:     There is 2+ edema. 2+ DP pulses  NEUROLOGICAL:     Grossly intact.            MUSCULOSKELETAL:       There is no enlargement or gross deformity of the joints. Left shoulder limited movt. SKIN:       No rash  ASSESSMENT AND PLAN: Problem List Items Addressed This Visit      Cardiovascular and Mediastinum   Hypertension    BP at target on current therapy, being monitored by Dr Lorin Picket. Urine MA normal June 2015.         Endocrine   Diabetes mellitus - Primary    Recent A1c not at target, likely from recent stressful events.  Sugars are getting better after recent  modifications to diet. I have asked her to avoid any sauces with her pasta, limit portion sizes, add a side salad etc to lower carb intake.  Asked her to try add a walk to her routine.   Check sugars 2-3 x daily.  Discussed each of her DM medications in detail. Dicussed warnings with Actos and associated fluid retention.  Discussed risk of hypoglycemia, weight gain with SU.  We discussed addition of newer medications like GLP-1, SGLT2 to her regimen. She is agreeable to try SGLT2 therapy at this time.  Discussed risk profile and common side effects.  She will start Invokana 100 mg daily. Return in 1 week for BMP check. If her K levels continue to stay normal, then she can assess dose modifying HCTZ/ACE-I over next few visits to keep K/BP in normal range. This will be done in concert with Dr Lorin Picket.  Stop Glipizide. Continue current Actos, Metformin, Lantus and [name] for now.  Over time, could introduce GLP-1 and try withdrawing [name]/Actos.  Foot care, eye exams, hypoglycemia, nutrition discussed.       Relevant Medications   canagliflozin (INVOKANA) 100 MG TABS tablet   Other Relevant Orders   Basic metabolic panel     Other   Hypercholesterolemia    LDL at target on current statin. Continue.           - Return to clinic in 1 mo with sugar log/meter.  Quillian Quince [facility] 07/12/2014 12:35 PM

## 2014-07-12 NOTE — Assessment & Plan Note (Signed)
LDL at target on current statin. Continue.

## 2014-07-14 ENCOUNTER — Other Ambulatory Visit: Payer: Self-pay | Admitting: Internal Medicine

## 2014-07-17 ENCOUNTER — Encounter: Payer: Self-pay | Admitting: Internal Medicine

## 2014-07-17 DIAGNOSIS — Z0000 Encounter for general adult medical examination without abnormal findings: Secondary | ICD-10-CM | POA: Insufficient documentation

## 2014-07-17 DIAGNOSIS — R079 Chest pain, unspecified: Secondary | ICD-10-CM | POA: Insufficient documentation

## 2014-07-17 NOTE — Assessment & Plan Note (Addendum)
Follow potassium level.  Will have to watch on invokana.  May need to adjust medication.

## 2014-07-17 NOTE — Assessment & Plan Note (Signed)
Colonoscopy 2013 as outlined.  Stable.  Was increased with the increased stress, but now stable.

## 2014-07-17 NOTE — Assessment & Plan Note (Signed)
Increased stress as outlined.  See above.  Does not feel she needs anything more at this point.  Follow.

## 2014-07-17 NOTE — Assessment & Plan Note (Signed)
Blood pressure doing well.  Same medication regimen.  Follow pressures.  Follow metabolic panel.   

## 2014-07-17 NOTE — Assessment & Plan Note (Signed)
On simvastatin.  Low cholesterol diet and exercise.  Follow lipid panel and liver function tests.   

## 2014-07-17 NOTE — Assessment & Plan Note (Signed)
Physical 01/19/14.  Mammogram 01/27/14 - Birads I.  Colonoscopy 11/29/11 - one rectal polyp and diverticulosis.

## 2014-07-17 NOTE — Assessment & Plan Note (Signed)
colonoscopy 2013.  Recommended f/u colonoscopy in 10 years.

## 2014-07-17 NOTE — Assessment & Plan Note (Signed)
Continue b12 injections.  

## 2014-07-17 NOTE — Assessment & Plan Note (Signed)
Saw Dr Welford RochePhadke this am.  Sugars better as outlined.  In more of a routine now.  Continue diet adjustment.  Follow.

## 2014-07-17 NOTE — Assessment & Plan Note (Signed)
Chest pain as outlined.  EKG obtained and revealed SR with no acute ischemic changes.  Refer to cardiology as outlined.  Further w/up pending cardiology evaluation.

## 2014-07-19 ENCOUNTER — Other Ambulatory Visit: Payer: PPO

## 2014-07-22 ENCOUNTER — Encounter: Payer: Self-pay | Admitting: Internal Medicine

## 2014-08-09 ENCOUNTER — Ambulatory Visit: Payer: PPO | Admitting: Endocrinology

## 2014-08-10 ENCOUNTER — Ambulatory Visit (INDEPENDENT_AMBULATORY_CARE_PROVIDER_SITE_OTHER): Payer: PPO | Admitting: Cardiovascular Disease

## 2014-08-10 ENCOUNTER — Encounter: Payer: Self-pay | Admitting: Cardiovascular Disease

## 2014-08-10 VITALS — BP 132/72 | HR 66 | Ht 62.0 in | Wt 235.0 lb

## 2014-08-10 DIAGNOSIS — R600 Localized edema: Secondary | ICD-10-CM

## 2014-08-10 DIAGNOSIS — R0602 Shortness of breath: Secondary | ICD-10-CM | POA: Diagnosis not present

## 2014-08-10 DIAGNOSIS — E1140 Type 2 diabetes mellitus with diabetic neuropathy, unspecified: Secondary | ICD-10-CM

## 2014-08-10 DIAGNOSIS — E7800 Pure hypercholesterolemia, unspecified: Secondary | ICD-10-CM

## 2014-08-10 DIAGNOSIS — R079 Chest pain, unspecified: Secondary | ICD-10-CM | POA: Diagnosis not present

## 2014-08-10 DIAGNOSIS — Z6841 Body Mass Index (BMI) 40.0 and over, adult: Secondary | ICD-10-CM | POA: Insufficient documentation

## 2014-08-10 DIAGNOSIS — I10 Essential (primary) hypertension: Secondary | ICD-10-CM

## 2014-08-10 NOTE — Assessment & Plan Note (Signed)
We have encouraged continued exercise, careful diet management in an effort to lose weight. 

## 2014-08-10 NOTE — Assessment & Plan Note (Signed)
Cholesterol is at goal on the current lipid regimen. No changes to the medications were made.  

## 2014-08-10 NOTE — Assessment & Plan Note (Addendum)
Leg tenderness likely from venous insufficiency, possibly exacerbated by weight and amlodipine. Recommended compression hose Could consider decreasing her amlodipine dose or weaning off.

## 2014-08-10 NOTE — Progress Notes (Signed)
Patient ID: Beth Tyler, female    DOB: June 26, 1945, 69 y.o.   MRN: [id number]  HPI Comments: Ms. Frazier RichardsShepherd is a 69 year old woman with obesity, history of GERD, osteoarthritis, currently retired, patient of Dr. Lorin PicketScott, who presents for evaluation of chest pain.  She reports that over the past year or 2 she has had rare episodes of chest pain. She describes it as a "pinprick" on the left side of her chest sometimes lasting for 20-30 minutes. It will happen every several months, typically at rest, unrelated to food. She reports that she is very sedentary but has not noticed a correlation with exertion. Typically she will rub on her chest to make her symptoms resolve. She reports a range in intensity from sometimes 3-4/10, up to 7-8/10 at its worst. She had 5 episodes last year. Nothing seems to bring them on.  In her spare time, she has hobbies but no regular exercise or physical activity. Weight has been elevated but stable for 30 years per the patient She is struggling with her sugars, has endocrine follow-up  EKG on today's visit shows normal sinus rhythm with rate 66 bpm, no significant ST or T-wave changes   Allergies  Allergen Reactions  . Hydrocodone-Acetaminophen Anaphylaxis  . Percocet [Oxycodone-Acetaminophen] Anaphylaxis  . Penicillins Nausea Only  . Tetracyclines & Related     Current Outpatient Prescriptions on File Prior to Visit  Medication Sig Dispense Refill  . amLODipine (NORVASC) 10 MG tablet TAKE ONE TABLET BY MOUTH ONCE DAILY 90 tablet 0  . aspirin 81 MG tablet Take 81 mg by mouth daily.    Marland Kitchen. atenolol (TENORMIN) 100 MG tablet TAKE ONE TABLET BY MOUTH IN THE MORNING AND ONE-HALF IN THE EVENING 135 tablet 1  . canagliflozin (INVOKANA) 100 MG TABS tablet Take 1 tablet (100 mg total) by mouth daily. 30 tablet 3  . cyanocobalamin (,VITAMIN B-12,) 1000 MCG/ML injection Inject 1,000 mcg into the muscle every 30 (thirty) days.    . ferrous fumarate (HEMOCYTE - 106 MG  FE) 325 (106 FE) MG TABS tablet Take 1 tablet by mouth.    . fluticasone (CUTIVATE) 0.05 % cream Use as directed. 30 g 0  . fluticasone (FLOVENT HFA) 110 MCG/ACT inhaler Inhale 2 puffs into the lungs 2 (two) times daily. Rinse mouth after use 1 Inhaler 0  . hydrochlorothiazide (HYDRODIURIL) 25 MG tablet Take 1 tablet (25 mg total) by mouth daily. 90 tablet 1  . insulin glargine (LANTUS) 100 UNIT/ML injection Inject 16 Units into the skin daily.    . insulin lispro ([name]) 100 UNIT/ML injection Inject 10 Units into the skin daily. Take 10 units before lunch    . lisinopril (PRINIVIL,ZESTRIL) 20 MG tablet TAKE TWO TABLETS BY MOUTH IN THE MORNING AND ONE IN THE EVENING 90 tablet 5  . magnesium oxide (MAG-OX) 400 MG tablet Take 1 tablet (400 mg total) by mouth daily. 90 tablet 1  . metFORMIN (GLUCOPHAGE) 1000 MG tablet TAKE ONE TABLET BY MOUTH TWICE DAILY WITH MEALS 180 tablet 0  . omeprazole (PRILOSEC) 20 MG capsule Take 1 capsule (20 mg total) by mouth 2 (two) times daily. 180 capsule 1  . pioglitazone (ACTOS) 30 MG tablet TAKE ONE TABLET BY MOUTH ONCE DAILY 90 tablet 0  . simvastatin (ZOCOR) 10 MG tablet TAKE ONE TABLET BY MOUTH AT BEDTIME 90 tablet 0   No current facility-administered medications on file prior to visit.    Past Medical History  Diagnosis Date  . Diabetes mellitus,  type II   . Hypertension   . Hypercholesterolemia   . GERD (gastroesophageal reflux disease)   . IBS (irritable bowel syndrome)   . Gastritis   . B12 deficiency   . Arthritis   . Diabetes mellitus 02/17/2012  . Anemia   . Pernicious anemia     Past Surgical History  Procedure Laterality Date  . Breast surgery  1980    benign cyst removal  . Cholecystectomy  1983    Social History  reports that she has never smoked. She has never used smokeless tobacco. She reports that she does not drink alcohol or use illicit drugs.  Family History family history includes Alcohol abuse in her mother; Breast  cancer in her maternal aunt; COPD in her sister; Dementia in her mother; Diabetes in her father, mother, other, and sister; Heart disease in her father; Heart disease (age of onset: 8850) in her son; Hemophilia in her brother; Kidney disease in her father; Uterine cancer in her sister.  Review of Systems  Constitutional: Negative.   HENT: Negative.   Eyes: Negative.   Respiratory: Negative.   Cardiovascular: Positive for chest pain.  Gastrointestinal: Negative.   Endocrine: Negative.   Musculoskeletal: Negative.   Skin: Negative.   Allergic/Immunologic: Negative.   Neurological: Negative.   Hematological: Negative.   Psychiatric/Behavioral: Negative.   All other systems reviewed and are negative.   BP 132/72 mmHg  Pulse 66  Ht 5\' 2"  (1.575 m)  Wt 235 lb (106.595 kg)  BMI 42.97 kg/m2  Physical Exam  Constitutional: She is oriented to person, place, and time. She appears well-developed and well-nourished.  Obese  HENT:  Head: Normocephalic.  Nose: Nose normal.  Mouth/Throat: Oropharynx is clear and moist.  Eyes: Conjunctivae are normal. Pupils are equal, round, and reactive to light.  Neck: Normal range of motion. Neck supple. No JVD present.  Cardiovascular: Normal rate, regular rhythm, S1 normal, S2 normal, normal heart sounds and intact distal pulses.  Exam reveals no gallop and no friction rub.   No murmur heard. Trace edema of her lower extremities, very tender to palpation  Pulmonary/Chest: Effort normal and breath sounds normal. No respiratory distress. She has no wheezes. She has no rales. She exhibits no tenderness.  Abdominal: Soft. Bowel sounds are normal. She exhibits no distension. There is no tenderness.  Musculoskeletal: Normal range of motion. She exhibits edema. She exhibits no tenderness.  Lymphadenopathy:    She has no cervical adenopathy.  Neurological: She is alert and oriented to person, place, and time. Coordination normal.  Skin: Skin is warm and dry. No  rash noted. No erythema.  Psychiatric: She has a normal mood and affect. Her behavior is normal. Judgment and thought content normal.    Assessment and Plan  Nursing note and vitals reviewed.

## 2014-08-10 NOTE — Assessment & Plan Note (Signed)
She is working with endocrinology. Recommended she follow a low carbohydrate diet

## 2014-08-10 NOTE — Patient Instructions (Signed)
You are doing well. No medication changes were made.  Please call the office if you have worsening chest pains and if they are more frequent  Please call us if you have new issues that need to be addressed before your next appt.

## 2014-08-10 NOTE — Assessment & Plan Note (Signed)
Blood pressure is well controlled on today's visit. No changes made to the medications. 

## 2014-08-10 NOTE — Assessment & Plan Note (Addendum)
Atypical chest pain symptoms. 5 episodes last year, typically at rest. She is very sedentary at baseline. She does have risk factors including  dabetes which is poorly controlled at this time.  Options include close monitoring of her symptoms with testing if symptoms get worse, particularly with exertion considering a chemical stress test. We also discussed a coronary calcium score done in [place] for risk stratification. She does not want a chemical stress test. She reports having one of these several years ago and does not want to repeat this. She prefers to monitor her symptoms for now. EKG is essentially normal, and given that her symptoms are very rare, not reproducible with exertion, I feel it is safe to monitor her symptoms . If she does change her mind concerning a calcium score, suggested she call the office and this would be arranged

## 2014-08-24 ENCOUNTER — Other Ambulatory Visit: Payer: Self-pay | Admitting: Internal Medicine

## 2014-08-26 ENCOUNTER — Telehealth: Payer: Self-pay

## 2014-08-26 NOTE — Telephone Encounter (Signed)
Spoke to patient on the phone to give her an update. Patient stated that she may be able to get the medication but she knows it will be a struggle to get medication EVERY month.

## 2014-08-26 NOTE — Telephone Encounter (Signed)
Called [employer] to find out with medication would have better coverage. [facility]. I spoke with Susann GivensFranklin. I gave him a list of medication that patient could try and they all were either tier 3 or 4. Which would make the price of the medication the same or higher for patient. Invokana would be $45/month. I asked about Brantley StageJardiance, Farxiga, Byette, victoza, bydureon and trulicity. All medications are $45/ month or more. Is there and other medication I can ask for prices on? Please advise.

## 2014-08-27 NOTE — Telephone Encounter (Signed)
Sorry to hear this. She needs to come back to discuss just being on insulin and adjusting the 2 insulins that she is on rather than trying any of the newer meds listed here

## 2014-08-31 NOTE — Telephone Encounter (Signed)
Called patient no answer. Left voicemail requesting a callback as soon as possible.

## 2014-09-01 ENCOUNTER — Ambulatory Visit (INDEPENDENT_AMBULATORY_CARE_PROVIDER_SITE_OTHER): Payer: PPO | Admitting: *Deleted

## 2014-09-01 DIAGNOSIS — E538 Deficiency of other specified B group vitamins: Secondary | ICD-10-CM

## 2014-09-01 MED ORDER — CYANOCOBALAMIN 1000 MCG/ML IJ SOLN
1000.0000 ug | Freq: Once | INTRAMUSCULAR | Status: AC
  Administered 2014-09-01: 1000 ug via INTRAMUSCULAR

## 2014-09-01 NOTE — Telephone Encounter (Signed)
Pt notified today while in for nurse visit & appt scheduled

## 2014-09-13 ENCOUNTER — Other Ambulatory Visit: Payer: Self-pay | Admitting: Internal Medicine

## 2014-09-13 ENCOUNTER — Encounter: Payer: Self-pay | Admitting: Endocrinology

## 2014-09-13 ENCOUNTER — Other Ambulatory Visit: Payer: Self-pay | Admitting: *Deleted

## 2014-09-13 MED ORDER — SIMVASTATIN 10 MG PO TABS: 10.0000 mg | ORAL_TABLET | Freq: Every day | ORAL | Status: DC

## 2014-09-14 ENCOUNTER — Telehealth: Payer: Self-pay | Admitting: *Deleted

## 2014-09-14 ENCOUNTER — Ambulatory Visit: Payer: PPO | Admitting: Endocrinology

## 2014-09-14 MED ORDER — INSULIN GLARGINE 100 UNIT/ML SOLOSTAR PEN: 20.0000 [IU] | PEN_INJECTOR | SUBCUTANEOUS | Status: DC

## 2014-09-14 MED ORDER — INSULIN LISPRO 100 UNIT/ML (KWIKPEN): PEN_INJECTOR | SUBCUTANEOUS | Status: DC

## 2014-09-14 MED ORDER — GLIPIZIDE ER 5 MG PO TB24: ORAL_TABLET | ORAL | Status: DC

## 2014-09-14 MED ORDER — INSULIN PEN NEEDLE 32G X 8 MM MISC: Status: DC

## 2014-09-14 NOTE — Telephone Encounter (Signed)
Spoke to pt, confirmed insulin dose and taking Glipizide 5 mg 24 hr tablet once daily. Pt prefers 90 day supplies. Uses pen form of insulin and needs pen needles as well.

## 2014-09-14 NOTE — Telephone Encounter (Signed)
Per Dr. Welford RochePhadke:     The patient cancelled her appt with me for this week due to cost.     Looks like she has slightly increased the doses of insulin compared to what she told me last time, which is fine, because her sugars were elevated.    Since she cant follow up with me, I have advised her to f/u dr Lorin PicketScott    She is on metformin, glipizide, actos, lantus and [name] - ok to refill at the doses that she is using right now- confirm them with patient.     Okay to refill testing supplies at checks 3 x daily        Dr Lorin PicketScott- my plan for her sugars was to adjust the 2 insulins for now based on her sugars, while trying to take away Glipizide.         Please let me know if you have any questions.     thanks

## 2014-09-14 NOTE — Telephone Encounter (Signed)
Rx sent to pharmacy by escript  

## 2014-09-14 NOTE — Telephone Encounter (Signed)
Left message for pt to return my call and also sent mychart msg with questions

## 2014-09-14 NOTE — Telephone Encounter (Signed)
Fax from pharmacy, needing PA for [name] Kwikpen. Started online, pending response.

## 2014-09-14 NOTE — Addendum Note (Signed)
Addended by: Chandra BatchNIXON, Aurthur Wingerter E on: 09/14/2014 02:50 PM   Modules accepted: Orders, Medications

## 2014-09-16 NOTE — Telephone Encounter (Signed)
Fax from [facility], PA approved through 04/08/15.  

## 2014-09-22 ENCOUNTER — Telehealth: Payer: Self-pay | Admitting: *Deleted

## 2014-09-22 MED ORDER — INSULIN LISPRO 100 UNIT/ML ~~LOC~~ SOLN: SUBCUTANEOUS | Status: DC

## 2014-09-22 MED ORDER — INSULIN GLARGINE 100 UNIT/ML ~~LOC~~ SOLN: 20.0000 [IU] | Freq: Every day | SUBCUTANEOUS | Status: DC

## 2014-09-22 NOTE — Telephone Encounter (Signed)
Fax from [place], [place] approved.

## 2014-09-22 NOTE — Addendum Note (Signed)
Addended by: Chandra Batch E on: 09/22/2014 08:40 AM   Modules accepted: Orders

## 2014-09-22 NOTE — Telephone Encounter (Addendum)
Fax from pharmacy, needing PA for [name] vials. Started online, pending response

## 2014-09-23 ENCOUNTER — Other Ambulatory Visit: Payer: Self-pay | Admitting: Internal Medicine

## 2014-10-14 ENCOUNTER — Other Ambulatory Visit: Payer: Self-pay | Admitting: Internal Medicine

## 2014-10-23 ENCOUNTER — Other Ambulatory Visit: Payer: Self-pay | Admitting: Internal Medicine

## 2014-11-02 ENCOUNTER — Other Ambulatory Visit: Payer: Self-pay | Admitting: Internal Medicine

## 2014-11-08 ENCOUNTER — Other Ambulatory Visit: Payer: Self-pay | Admitting: Internal Medicine

## 2014-11-10 ENCOUNTER — Other Ambulatory Visit (INDEPENDENT_AMBULATORY_CARE_PROVIDER_SITE_OTHER): Payer: PPO

## 2014-11-10 ENCOUNTER — Telehealth: Payer: Self-pay | Admitting: *Deleted

## 2014-11-10 ENCOUNTER — Other Ambulatory Visit: Payer: Self-pay | Admitting: Internal Medicine

## 2014-11-10 ENCOUNTER — Other Ambulatory Visit
Admission: RE | Admit: 2014-11-10 | Discharge: 2014-11-10 | Disposition: A | Discharge status: Home / Self Care | Payer: PPO | Source: Ambulatory Visit | Attending: Internal Medicine | Admitting: Internal Medicine

## 2014-11-10 DIAGNOSIS — Z1239 Encounter for other screening for malignant neoplasm of breast: Secondary | ICD-10-CM | POA: Diagnosis present

## 2014-11-10 DIAGNOSIS — E875 Hyperkalemia: Secondary | ICD-10-CM | POA: Diagnosis not present

## 2014-11-10 DIAGNOSIS — E7800 Pure hypercholesterolemia, unspecified: Secondary | ICD-10-CM

## 2014-11-10 DIAGNOSIS — E1140 Type 2 diabetes mellitus with diabetic neuropathy, unspecified: Secondary | ICD-10-CM

## 2014-11-10 DIAGNOSIS — I10 Essential (primary) hypertension: Secondary | ICD-10-CM

## 2014-11-10 LAB — BASIC METABOLIC PANEL
BUN: 17 mg/dL (ref 6–23)
CALCIUM: 9.6 mg/dL (ref 8.4–10.5)
CHLORIDE: 104 meq/L (ref 96–112)
CO2: 31 meq/L (ref 19–32)
Creatinine, Ser: 0.83 mg/dL (ref 0.40–1.20)
GFR: 72.21 mL/min (ref >60.00)
GLUCOSE: 171 mg/dL — AB (ref 70–99)
Potassium: 5.9 mEq/L — ABNORMAL HIGH (ref 3.5–5.1)
SODIUM: 142 meq/L (ref 135–145)

## 2014-11-10 LAB — HEPATIC FUNCTION PANEL
ALT: 21 U/L (ref 0–35)
AST: 16 U/L (ref 0–37)
Albumin: 4.1 g/dL (ref 3.5–5.2)
Alkaline Phosphatase: 76 U/L (ref 39–117)
Bilirubin, Direct: 0.1 mg/dL (ref 0.0–0.3)
Total Bilirubin: 0.5 mg/dL (ref 0.2–1.2)
Total Protein: 6.6 g/dL (ref 6.0–8.3)

## 2014-11-10 LAB — HEMOGLOBIN A1C: Hgb A1c MFr Bld: 9.6 % — ABNORMAL HIGH (ref 4.6–6.5)

## 2014-11-10 LAB — LIPID PANEL
CHOLESTEROL: 127 mg/dL (ref 0–200)
HDL: 49.3 mg/dL (ref >39.00)
LDL CALC: 53 mg/dL (ref 0–99)
NONHDL: 77.9
TRIGLYCERIDES: 123 mg/dL (ref 0.0–149.0)
Total CHOL/HDL Ratio: 3
VLDL: 24.6 mg/dL (ref 0.0–40.0)

## 2014-11-10 LAB — POTASSIUM: Potassium: 4.9 mmol/L (ref 3.5–5.1)

## 2014-11-10 LAB — MICROALBUMIN / CREATININE URINE RATIO
CREATININE, U: 71.4 mg/dL
MICROALB/CREAT RATIO: 5.7 mg/g (ref 0.0–30.0)
Microalb, Ur: 4.1 mg/dL — ABNORMAL HIGH (ref 0.0–1.9)

## 2014-11-10 NOTE — Progress Notes (Signed)
Orders placed for labs

## 2014-11-11 ENCOUNTER — Encounter: Payer: Self-pay | Admitting: Internal Medicine

## 2014-11-11 NOTE — Telephone Encounter (Signed)
Opened in error, see lab note

## 2014-11-12 ENCOUNTER — Other Ambulatory Visit: Payer: Self-pay | Admitting: Internal Medicine

## 2014-11-15 ENCOUNTER — Ambulatory Visit (INDEPENDENT_AMBULATORY_CARE_PROVIDER_SITE_OTHER): Payer: PPO | Admitting: Internal Medicine

## 2014-11-15 ENCOUNTER — Encounter: Payer: Self-pay | Admitting: Internal Medicine

## 2014-11-15 VITALS — BP 128/64 | HR 62 | Temp 98.2°F | Ht 62.0 in | Wt 233.2 lb

## 2014-11-15 DIAGNOSIS — E7800 Pure hypercholesterolemia, unspecified: Secondary | ICD-10-CM

## 2014-11-15 DIAGNOSIS — Z0000 Encounter for general adult medical examination without abnormal findings: Secondary | ICD-10-CM

## 2014-11-15 DIAGNOSIS — I10 Essential (primary) hypertension: Secondary | ICD-10-CM

## 2014-11-15 DIAGNOSIS — F439 Reaction to severe stress, unspecified: Secondary | ICD-10-CM

## 2014-11-15 DIAGNOSIS — Z658 Other specified problems related to psychosocial circumstances: Secondary | ICD-10-CM

## 2014-11-15 DIAGNOSIS — R600 Localized edema: Secondary | ICD-10-CM

## 2014-11-15 DIAGNOSIS — E875 Hyperkalemia: Secondary | ICD-10-CM

## 2014-11-15 DIAGNOSIS — Z1211 Encounter for screening for malignant neoplasm of colon: Secondary | ICD-10-CM

## 2014-11-15 DIAGNOSIS — Z1239 Encounter for other screening for malignant neoplasm of breast: Secondary | ICD-10-CM

## 2014-11-15 DIAGNOSIS — E1140 Type 2 diabetes mellitus with diabetic neuropathy, unspecified: Secondary | ICD-10-CM

## 2014-11-15 DIAGNOSIS — E538 Deficiency of other specified B group vitamins: Secondary | ICD-10-CM | POA: Diagnosis not present

## 2014-11-15 DIAGNOSIS — K589 Irritable bowel syndrome without diarrhea: Secondary | ICD-10-CM

## 2014-11-15 MED ORDER — CYANOCOBALAMIN 1000 MCG/ML IJ SOLN
1000.0000 ug | Freq: Once | INTRAMUSCULAR | Status: AC
  Administered 2014-11-15: 1000 ug via INTRAMUSCULAR

## 2014-11-15 NOTE — Patient Instructions (Signed)
Add 4 units of lantus before bed.  Eat a bedtime snack.

## 2014-11-15 NOTE — Progress Notes (Signed)
Patient ID: Beth Tyler, female   DOB: [birth year], 69 y.o.   MRN: [id number]   Subjective:    Patient ID: Beth Tyler, female    DOB: Jul 08, 1945, 69 y.o.   MRN: [id number]  HPI  Patient here for a scheduled follow up.  Increased stress as outlined in previous note.  Her son recently had open heart surgery.  Husband has been back in the hospital.  Also diagnosed with lung cancer.  Best friend's daughter was killed in a car crash.  Discussed with her at length today.  She does not feel she needs any further intervention.  Sugars have been elevated.  Blood sugars in am averaging 180-220.  Lunch readings 160s.  180s before bed.  Discussed the need for diet and exercise.  Breathing stable. Bowels unchanged.  She is unable to afford her humalog.  Has been off of this for a while.  Unable to afford invokana and other medications.     Past Medical History  Diagnosis Date  . Diabetes mellitus, type II   . Hypertension   . Hypercholesterolemia   . GERD (gastroesophageal reflux disease)   . IBS (irritable bowel syndrome)   . Gastritis   . B12 deficiency   . Arthritis   . Diabetes mellitus 02/17/2012  . Anemia   . Pernicious anemia     Outpatient Encounter Prescriptions as of 11/15/2014  Medication Sig  . amLODipine (NORVASC) 10 MG tablet TAKE ONE TABLET BY MOUTH ONCE DAILY  . aspirin 81 MG tablet Take 81 mg by mouth daily.  Marland Kitchen atenolol (TENORMIN) 100 MG tablet TAKE ONE TABLET BY MOUTH IN THE MORNING AND ONE-HALF IN THE EVENING  . cyanocobalamin (,VITAMIN B-12,) 1000 MCG/ML injection Inject 1,000 mcg into the muscle every 30 (thirty) days.  . ferrous fumarate (HEMOCYTE - 106 MG FE) 325 (106 FE) MG TABS tablet Take 1 tablet by mouth.  . fluticasone (CUTIVATE) 0.05 % cream Use as directed.  Marland Kitchen glipiZIDE (GLUCOTROL XL) 5 MG 24 hr tablet TAKE ONE TABLET BY MOUTH WITH BREAKFAST  . hydrochlorothiazide (HYDRODIURIL) 25 MG tablet TAKE ONE TABLET BY MOUTH ONCE DAILY  . insulin glargine (LANTUS) 100  UNIT/ML injection 20 units q am and 4 units q hs  . insulin lispro ([name]) 100 UNIT/ML injection Inject 12 units before lunch and 12 units before dinner  . Insulin Pen Needle 32G X 8 MM MISC Use as directed three times daily Dx E11.9  . lisinopril (PRINIVIL,ZESTRIL) 20 MG tablet TAKE TWO TABLETS BY MOUTH IN THE MORNING AND ONE IN THE EVENING  . magnesium oxide (MAG-OX) 400 MG tablet TAKE ONE TABLET BY MOUTH ONCE DAILY  . metFORMIN (GLUCOPHAGE) 1000 MG tablet TAKE ONE TABLET BY MOUTH TWICE DAILY WITH MEALS  . omeprazole (PRILOSEC) 20 MG capsule TAKE ONE CAPSULE BY MOUTH TWICE DAILY  . pioglitazone (ACTOS) 30 MG tablet TAKE ONE TABLET BY MOUTH ONCE DAILY  . simvastatin (ZOCOR) 10 MG tablet Take 1 tablet (10 mg total) by mouth at bedtime.  . [DISCONTINUED] fluticasone (FLOVENT HFA) 110 MCG/ACT inhaler Inhale 2 puffs into the lungs 2 (two) times daily. Rinse mouth after use  . [DISCONTINUED] insulin glargine (LANTUS) 100 UNIT/ML injection Inject 0.2 mLs (20 Units total) into the skin daily.  . [DISCONTINUED] magnesium oxide (MAG-OX) 400 MG tablet TAKE ONE TABLET BY MOUTH ONCE DAILY  . [EXPIRED] cyanocobalamin ((VITAMIN B-12)) injection 1,000 mcg    No facility-administered encounter medications on file as of 11/15/2014.    Review  of Systems  Constitutional: Negative for appetite change and unexpected weight change.  HENT: Negative for congestion and sinus pressure.   Respiratory: Negative for cough, chest tightness and shortness of breath.   Cardiovascular: Negative for chest pain and palpitations.       No increased leg swelling.   Gastrointestinal: Negative for nausea, vomiting and abdominal pain.       Bowels unchanged.    Genitourinary: Negative for dysuria and difficulty urinating.  Musculoskeletal:       Some leg discomfort.  Unchanged.  Stable.   Skin: Negative for color change and rash.  Neurological: Negative for dizziness, light-headedness and headaches.  Psychiatric/Behavioral:  Negative for dysphoric mood and agitation.       Objective:    Physical Exam  Constitutional: She appears well-developed and well-nourished. No distress.  HENT:  Nose: Nose normal.  Mouth/Throat: Oropharynx is clear and moist.  Eyes: Conjunctivae are normal. Right eye exhibits no discharge. Left eye exhibits no discharge.  Neck: Neck supple. No thyromegaly present.  Cardiovascular: Normal rate and regular rhythm.   Pulmonary/Chest: Breath sounds normal. No respiratory distress. She has no wheezes.  Abdominal: Soft. Bowel sounds are normal. There is no tenderness.  Musculoskeletal: She exhibits no tenderness.  No increased edema.    Lymphadenopathy:    She has no cervical adenopathy.  Skin: No rash noted. No erythema.  Psychiatric: She has a normal mood and affect. Her behavior is normal.    BP 128/64 mmHg  Pulse 62  Temp(Src) 98.2 F (36.8 C) (Oral)  Ht 5\' 2"  (1.575 m)  Wt 233 lb 4 oz (105.802 kg)  BMI 42.65 kg/m2  SpO2 97% Wt Readings from Last 3 Encounters:  11/15/14 233 lb 4 oz (105.802 kg)  08/10/14 235 lb (106.595 kg)  07/12/14 236 lb (107.049 kg)     Lab Results  Component Value Date   WBC 8.9 07/07/2014   HGB 12.6 07/07/2014   HCT 38.4 07/07/2014   PLT 217.0 07/07/2014   GLUCOSE 171* 11/10/2014   CHOL 127 11/10/2014   TRIG 123.0 11/10/2014   HDL 49.30 11/10/2014   LDLDIRECT 74.4 02/21/2012   LDLCALC 53 11/10/2014   ALT 21 11/10/2014   AST 16 11/10/2014   NA 142 11/10/2014   K 4.9 11/10/2014   CL 104 11/10/2014   CREATININE 0.83 11/10/2014   BUN 17 11/10/2014   CO2 31 11/10/2014   TSH 1.70 01/19/2014   HGBA1C 9.6* 11/10/2014   MICROALBUR 4.1* 11/10/2014       Assessment & Plan:   Problem List Items Addressed This Visit    B12 deficiency    Has missed her last few injections.  B12 injection given today.       Relevant Medications   cyanocobalamin ((VITAMIN B-12)) injection 1,000 mcg (Completed)   Bilateral leg edema    Stable.  Discussed  compression hose.       Colon cancer screening    Colonoscopy 2013.  Recommended f/u colonoscopy in 10 years.        Diabetes mellitus    Was seeing endocrinology.  Her am sugars are elevated as outlined.  Cost is an issue.  She is unable to afford humalog.  She will check with her insurance regarding cheaper options.  Will add a pm dose of lantus to help with am sugars.  This will be the most cost effective for her.  Start lantus 4 units in the evening.  If sugars remain elevated >180, then increase 2  untis. Send in sugar readings over the next couple of weeks.        Relevant Medications   insulin glargine (LANTUS) 100 UNIT/ML injection   Health care maintenance    Physical 01/19/14.  Mammogram 01/27/14 - Birads I.  Scheduled f/u mammogram.  Colonoscopy 11/29/11 - one rectal polyp and diverticulosis. Recommended f/u colonoscopy in 10 years.        Hypercholesterolemia    On simvastatin.  Low cholesterol diet and exercise.  Follow lipid panel and liver function tests.        Hyperkalemia    Recent potassium rechecked and wnl 4.9.        Hypertension    Blood pressure under good control.  Continue same medication regimen.  Follow pressures.  Follow metabolic panel.        IBS (irritable bowel syndrome)    Bowels stable.        Morbid obesity    Diet and exercise.  Follow.       Relevant Medications   insulin glargine (LANTUS) 100 UNIT/ML injection   Stress    Stress as outlined.  Does not feel she needs any further intervention at this time.  Follow.        Other Visit Diagnoses    Screening breast examination    -  Primary    Relevant Orders    MM DIGITAL SCREENING BILATERAL      I spent 25 minutes with the patient and more than 50% of the time was spent in consultation regarding the above.     [place], MD

## 2014-11-15 NOTE — Progress Notes (Signed)
Pre visit review using our clinic review tool, if applicable. No additional management support is needed unless otherwise documented below in the visit note. 

## 2014-11-16 ENCOUNTER — Encounter: Payer: Self-pay | Admitting: Internal Medicine

## 2014-11-16 MED ORDER — INSULIN GLARGINE 100 UNIT/ML ~~LOC~~ SOLN: SUBCUTANEOUS | Status: DC

## 2014-11-16 NOTE — Assessment & Plan Note (Signed)
Blood pressure under good control.  Continue same medication regimen.  Follow pressures.  Follow metabolic panel.   

## 2014-11-16 NOTE — Assessment & Plan Note (Signed)
Was seeing endocrinology.  Her am sugars are elevated as outlined.  Cost is an issue.  She is unable to afford humalog.  She will check with her insurance regarding cheaper options.  Will add a pm dose of lantus to help with am sugars.  This will be the most cost effective for her.  Start lantus 4 units in the evening.  If sugars remain elevated >180, then increase 2 untis. Send in sugar readings over the next couple of weeks.

## 2014-11-16 NOTE — Assessment & Plan Note (Signed)
Recent potassium rechecked and wnl 4.9.

## 2014-11-16 NOTE — Assessment & Plan Note (Signed)
Has missed her last few injections.  B12 injection given today.

## 2014-11-16 NOTE — Assessment & Plan Note (Signed)
Stable.  Discussed compression hose.

## 2014-11-16 NOTE — Assessment & Plan Note (Signed)
Bowels stable.  

## 2014-11-16 NOTE — Assessment & Plan Note (Signed)
On simvastatin.  Low cholesterol diet and exercise.  Follow lipid panel and liver function tests.   

## 2014-11-16 NOTE — Assessment & Plan Note (Signed)
Diet and exercise.  Follow.  

## 2014-11-16 NOTE — Assessment & Plan Note (Signed)
Stress as outlined.  Does not feel she needs any further intervention at this time.  Follow.

## 2014-11-16 NOTE — Assessment & Plan Note (Signed)
Colonoscopy 2013.  Recommended f/u colonoscopy in 10 years.   

## 2014-11-16 NOTE — Assessment & Plan Note (Signed)
Physical 01/19/14.  Mammogram 01/27/14 - Birads I.  Scheduled f/u mammogram.  Colonoscopy 11/29/11 - one rectal polyp and diverticulosis. Recommended f/u colonoscopy in 10 years.

## 2014-12-14 ENCOUNTER — Other Ambulatory Visit: Payer: Self-pay | Admitting: Endocrinology

## 2014-12-21 ENCOUNTER — Other Ambulatory Visit: Payer: Self-pay

## 2014-12-21 NOTE — Telephone Encounter (Signed)
Please advise as Dr. Welford Roche is no longer in our office. Thanks

## 2014-12-24 NOTE — Telephone Encounter (Signed)
This will need to be determined by Dr. Lorin Picket as she does not have an appointment with me

## 2014-12-26 NOTE — Telephone Encounter (Signed)
Please advise refill as Dr. Lucianne Muss is not seeing this patient.

## 2014-12-26 NOTE — Telephone Encounter (Signed)
Noted.  When clarified ok to refill dose taking.

## 2014-12-26 NOTE — Telephone Encounter (Signed)
Left message for patient to return my call to clarify.

## 2014-12-26 NOTE — Telephone Encounter (Signed)
Please confirm that this is the correct dose.  If so, ok to refill x 2.

## 2014-12-27 ENCOUNTER — Other Ambulatory Visit: Payer: Self-pay | Admitting: *Deleted

## 2014-12-27 MED ORDER — GLIPIZIDE ER 5 MG PO TB24: ORAL_TABLET | ORAL | Status: DC

## 2014-12-29 ENCOUNTER — Ambulatory Visit (INDEPENDENT_AMBULATORY_CARE_PROVIDER_SITE_OTHER): Payer: PPO | Admitting: Internal Medicine

## 2014-12-29 ENCOUNTER — Encounter: Payer: Self-pay | Admitting: Internal Medicine

## 2014-12-29 VITALS — BP 110/60 | HR 62 | Temp 98.2°F | Resp 18 | Ht 62.0 in | Wt 234.8 lb

## 2014-12-29 DIAGNOSIS — E7800 Pure hypercholesterolemia, unspecified: Secondary | ICD-10-CM

## 2014-12-29 DIAGNOSIS — E538 Deficiency of other specified B group vitamins: Secondary | ICD-10-CM | POA: Diagnosis not present

## 2014-12-29 DIAGNOSIS — Z23 Encounter for immunization: Secondary | ICD-10-CM | POA: Diagnosis not present

## 2014-12-29 DIAGNOSIS — N649 Disorder of breast, unspecified: Secondary | ICD-10-CM | POA: Diagnosis not present

## 2014-12-29 DIAGNOSIS — I10 Essential (primary) hypertension: Secondary | ICD-10-CM

## 2014-12-29 DIAGNOSIS — E1140 Type 2 diabetes mellitus with diabetic neuropathy, unspecified: Secondary | ICD-10-CM

## 2014-12-29 DIAGNOSIS — Z658 Other specified problems related to psychosocial circumstances: Secondary | ICD-10-CM

## 2014-12-29 DIAGNOSIS — F439 Reaction to severe stress, unspecified: Secondary | ICD-10-CM

## 2014-12-29 MED ORDER — CYANOCOBALAMIN 1000 MCG/ML IJ SOLN
1000.0000 ug | Freq: Once | INTRAMUSCULAR | Status: AC
  Administered 2014-12-29: 1000 ug via INTRAMUSCULAR

## 2014-12-29 NOTE — Progress Notes (Signed)
Pre-visit discussion using our clinic review tool. No additional management support is needed unless otherwise documented below in the visit note.  

## 2015-01-01 ENCOUNTER — Encounter: Payer: Self-pay | Admitting: Internal Medicine

## 2015-01-01 NOTE — Assessment & Plan Note (Signed)
Sugars as outlined.  Change Lantus to am and continue pm dose of lantus.  Check and record sugars and send in readings over the next 2-3 weeks. Titrate up the lantus doses as needed.  Cost is an issue.  Trying to minimize her medication regimen.

## 2015-01-01 NOTE — Assessment & Plan Note (Signed)
Handling stress well.  Follow.  Does not feel she needs any further intervention at this time.  Follow.

## 2015-01-01 NOTE — Assessment & Plan Note (Signed)
Blood pressure under good control.  Continue same medication regimen.  Follow pressures.  Follow metabolic panel.   

## 2015-01-01 NOTE — Assessment & Plan Note (Signed)
Continue B12 injections.   

## 2015-01-01 NOTE — Progress Notes (Signed)
Patient ID: Beth Tyler, female   DOB: March 13, 1946, 69 y.o.   MRN: [id number]   Subjective:    Patient ID: Beth Tyler, female    DOB: Apr 22, 1945, 69 y.o.   MRN: [id number]  HPI  Patient with past history of hypertension, hypercholesterolemia and diabetes.  She comes in today to follow up on these issues.  Feels better.  Handling stress.  Desires no further intervention.  Is taking 5 units of Lantus in the evening and 20 units of lantus at lunch.  Sugars are trending down.  See attached list.  Discussed with her regarding moving the lantus back to am.  Follow sugars and titrate up lantus dose as needed.  Cost is an issue.  Trying to minimize medications.     Past Medical History  Diagnosis Date  . Diabetes mellitus, type II   . Hypertension   . Hypercholesterolemia   . GERD (gastroesophageal reflux disease)   . IBS (irritable bowel syndrome)   . Gastritis   . B12 deficiency   . Arthritis   . Diabetes mellitus 02/17/2012  . Anemia   . Pernicious anemia    Past Surgical History  Procedure Laterality Date  . Breast surgery  1980    benign cyst removal  . Cholecystectomy  1983   Family History  Problem Relation Age of Onset  . Diabetes Father   . Kidney disease Father   . Heart disease Father   . Alcohol abuse Mother   . Dementia Mother   . Diabetes Mother   . Hemophilia Brother   . Uterine cancer Sister   . Diabetes Sister   . COPD Sister   . Breast cancer Maternal Aunt   . Diabetes Other   . Heart disease Son 93    CABG   Social History   Social History  . Marital Status: Married    Spouse Name: N/A  . Number of Children: 4  . Years of Education: N/A   Social History Main Topics  . Smoking status: Never Smoker   . Smokeless tobacco: Never Used  . Alcohol Use: No  . Drug Use: No  . Sexual Activity: Not Asked   Other Topics Concern  . None   Social History Narrative   Lives with her husband. Has four children. Manager of an apartment building. No  tobacco, alcohol or other drug use    Outpatient Encounter Prescriptions as of 12/29/2014  Medication Sig  . amLODipine (NORVASC) 10 MG tablet TAKE ONE TABLET BY MOUTH ONCE DAILY  . aspirin 81 MG tablet Take 81 mg by mouth daily.  Marland Kitchen atenolol (TENORMIN) 100 MG tablet TAKE ONE TABLET BY MOUTH IN THE MORNING AND ONE-HALF IN THE EVENING  . cyanocobalamin (,VITAMIN B-12,) 1000 MCG/ML injection Inject 1,000 mcg into the muscle every 30 (thirty) days.  . ferrous fumarate (HEMOCYTE - 106 MG FE) 325 (106 FE) MG TABS tablet Take 1 tablet by mouth.  . fluticasone (CUTIVATE) 0.05 % cream Use as directed.  Marland Kitchen glipiZIDE (GLUCOTROL XL) 5 MG 24 hr tablet TAKE ONE TABLET BY MOUTH WITH BREAKFAST  . hydrochlorothiazide (HYDRODIURIL) 25 MG tablet TAKE ONE TABLET BY MOUTH ONCE DAILY  . insulin glargine (LANTUS) 100 UNIT/ML injection 20 units q am and 4 units q hs (Patient taking differently: 20 units q am and 5 units q hs)  . insulin lispro ([name]) 100 UNIT/ML injection Inject 12 units before lunch and 12 units before dinner  . Insulin Pen Needle 32G X  8 MM MISC Use as directed three times daily Dx E11.9  . lisinopril (PRINIVIL,ZESTRIL) 20 MG tablet TAKE TWO TABLETS BY MOUTH IN THE MORNING AND ONE IN THE EVENING  . magnesium oxide (MAG-OX) 400 MG tablet TAKE ONE TABLET BY MOUTH ONCE DAILY  . metFORMIN (GLUCOPHAGE) 1000 MG tablet TAKE ONE TABLET BY MOUTH TWICE DAILY WITH MEALS  . omeprazole (PRILOSEC) 20 MG capsule TAKE ONE CAPSULE BY MOUTH TWICE DAILY  . pioglitazone (ACTOS) 30 MG tablet TAKE ONE TABLET BY MOUTH ONCE DAILY  . simvastatin (ZOCOR) 10 MG tablet Take 1 tablet (10 mg total) by mouth at bedtime.  . [EXPIRED] cyanocobalamin ((VITAMIN B-12)) injection 1,000 mcg    No facility-administered encounter medications on file as of 12/29/2014.    Review of Systems  Constitutional: Negative for appetite change and unexpected weight change.  HENT: Negative for congestion and sinus pressure.   Respiratory:  Negative for cough, chest tightness and shortness of breath.   Cardiovascular: Negative for chest pain, palpitations and leg swelling.  Gastrointestinal: Negative for nausea, vomiting, abdominal pain and diarrhea.  Genitourinary: Negative for dysuria and difficulty urinating.  Skin: Negative for color change and rash.  Neurological: Negative for dizziness, light-headedness and headaches.  Psychiatric/Behavioral: Negative for dysphoric mood and agitation.       Objective:    Physical Exam  Constitutional: She appears well-developed and well-nourished. No distress.  HENT:  Nose: Nose normal.  Mouth/Throat: Oropharynx is clear and moist.  Eyes: Conjunctivae are normal. Right eye exhibits no discharge. Left eye exhibits no discharge.  Neck: Neck supple. No thyromegaly present.  Cardiovascular: Normal rate and regular rhythm.   Pulmonary/Chest: Breath sounds normal. No respiratory distress. She has no wheezes.  Abdominal: Soft. Bowel sounds are normal. There is no tenderness.  Musculoskeletal: She exhibits no edema or tenderness.  Lymphadenopathy:    She has no cervical adenopathy.  Skin: No rash noted. No erythema.  Psychiatric: She has a normal mood and affect. Her behavior is normal.    BP 110/60 mmHg  Pulse 62  Temp(Src) 98.2 F (36.8 C) (Oral)  Resp 18  Ht  (1.575 m)  Wt 234 lb 12 oz (106.482 kg)  BMI 42.93 kg/m2  SpO2 97% Wt Readings from Last 3 Encounters:  12/29/14 234 lb 12 oz (106.482 kg)  11/15/14 233 lb 4 oz (105.802 kg)  08/10/14 235 lb (106.595 kg)     Lab Results  Component Value Date   WBC 8.9 07/07/2014   HGB 12.6 07/07/2014   HCT 38.4 07/07/2014   PLT 217.0 07/07/2014   GLUCOSE 171* 11/10/2014   CHOL 127 11/10/2014   TRIG 123.0 11/10/2014   HDL 49.30 11/10/2014   LDLDIRECT 74.4 02/21/2012   LDLCALC 53 11/10/2014   ALT 21 11/10/2014   AST 16 11/10/2014   NA 142 11/10/2014   K 4.9 11/10/2014   CL 104 11/10/2014   CREATININE 0.83 11/10/2014    BUN 17 11/10/2014   CO2 31 11/10/2014   TSH 1.70 01/19/2014   HGBA1C 9.6* 11/10/2014   MICROALBUR 4.1* 11/10/2014       Assessment & Plan:   Problem List Items Addressed This Visit    B12 deficiency    Continue B12 injections.        Relevant Medications   cyanocobalamin ((VITAMIN B-12)) injection 1,000 mcg (Completed)   Diabetes mellitus    Sugars as outlined.  Change Lantus to am and continue pm dose of lantus.  Check and record sugars and  send in readings over the next 2-3 weeks. Titrate up the lantus doses as needed.  Cost is an issue.  Trying to minimize her medication regimen.        Hypercholesterolemia    Low cholesterol diet and exercise.  On simvastatin.  Follow lipid panel and liver function tests.        Hypertension    Blood pressure under good control.  Continue same medication regimen.  Follow pressures.  Follow metabolic panel.        Stress    Handling stress well.  Follow.  Does not feel she needs any further intervention at this time.  Follow.         Other Visit Diagnoses    Breast lesion    -  Primary    Relevant Orders    Ambulatory referral to Dermatology    Encounter for immunization            Dale [place], MD

## 2015-01-01 NOTE — Assessment & Plan Note (Signed)
Low cholesterol diet and exercise.  On simvastatin.  Follow lipid panel and liver function tests.   

## 2015-01-02 ENCOUNTER — Other Ambulatory Visit: Payer: Self-pay

## 2015-01-02 MED ORDER — LISINOPRIL 20 MG PO TABS: 20.0000 mg | ORAL_TABLET | Freq: Two times a day (BID) | ORAL | Status: DC

## 2015-01-05 ENCOUNTER — Other Ambulatory Visit: Payer: Self-pay | Admitting: Internal Medicine

## 2015-01-21 ENCOUNTER — Other Ambulatory Visit: Payer: Self-pay | Admitting: Internal Medicine

## 2015-01-30 ENCOUNTER — Ambulatory Visit
Admission: RE | Admit: 2015-01-30 | Discharge: 2015-01-30 | Disposition: A | Discharge status: Home / Self Care | Payer: PPO | Source: Ambulatory Visit | Attending: Internal Medicine | Admitting: Internal Medicine

## 2015-01-30 DIAGNOSIS — Z1231 Encounter for screening mammogram for malignant neoplasm of breast: Secondary | ICD-10-CM | POA: Diagnosis not present

## 2015-01-30 DIAGNOSIS — Z1239 Encounter for other screening for malignant neoplasm of breast: Secondary | ICD-10-CM

## 2015-02-02 ENCOUNTER — Other Ambulatory Visit: Payer: Self-pay | Admitting: Internal Medicine

## 2015-02-08 ENCOUNTER — Encounter: Payer: Self-pay | Admitting: Internal Medicine

## 2015-02-08 ENCOUNTER — Ambulatory Visit (INDEPENDENT_AMBULATORY_CARE_PROVIDER_SITE_OTHER): Payer: PPO | Admitting: Internal Medicine

## 2015-02-08 VITALS — BP 130/70 | HR 60 | Temp 98.1°F | Resp 18 | Ht 62.0 in | Wt 236.0 lb

## 2015-02-08 DIAGNOSIS — Z794 Long term (current) use of insulin: Secondary | ICD-10-CM

## 2015-02-08 DIAGNOSIS — E7800 Pure hypercholesterolemia, unspecified: Secondary | ICD-10-CM

## 2015-02-08 DIAGNOSIS — F439 Reaction to severe stress, unspecified: Secondary | ICD-10-CM

## 2015-02-08 DIAGNOSIS — I10 Essential (primary) hypertension: Secondary | ICD-10-CM | POA: Diagnosis not present

## 2015-02-08 DIAGNOSIS — E538 Deficiency of other specified B group vitamins: Secondary | ICD-10-CM | POA: Diagnosis not present

## 2015-02-08 DIAGNOSIS — K589 Irritable bowel syndrome without diarrhea: Secondary | ICD-10-CM | POA: Diagnosis not present

## 2015-02-08 DIAGNOSIS — R059 Cough, unspecified: Secondary | ICD-10-CM

## 2015-02-08 DIAGNOSIS — E1140 Type 2 diabetes mellitus with diabetic neuropathy, unspecified: Secondary | ICD-10-CM

## 2015-02-08 DIAGNOSIS — R600 Localized edema: Secondary | ICD-10-CM

## 2015-02-08 DIAGNOSIS — R05 Cough: Secondary | ICD-10-CM

## 2015-02-08 DIAGNOSIS — Z658 Other specified problems related to psychosocial circumstances: Secondary | ICD-10-CM

## 2015-02-08 MED ORDER — CYANOCOBALAMIN 1000 MCG/ML IJ SOLN
1000.0000 ug | Freq: Once | INTRAMUSCULAR | Status: AC
  Administered 2015-02-08: 1000 ug via INTRAMUSCULAR

## 2015-02-08 MED ORDER — LOSARTAN POTASSIUM 100 MG PO TABS: 100.0000 mg | ORAL_TABLET | Freq: Every day | ORAL | Status: DC

## 2015-02-08 NOTE — Progress Notes (Signed)
Pre-visit discussion using our clinic review tool. No additional management support is needed unless otherwise documented below in the visit note.  

## 2015-02-08 NOTE — Progress Notes (Signed)
Patient ID: Beth Tyler, female   DOB: [birth year], 69 y.o.   MRN: [id number]   Subjective:    Patient ID: Beth Tyler, female    DOB: February 12, 1946, 69 y.o.   MRN: [id number]  HPI  Patient with past history of hypercholesterolemia, diabetes, hypertension and GERD.  She comes in today to follow up on these issues.  Cost is an issue for her.  We discussed last visit - trying to use her insulin and adjust this to control her sugars.  Was going to titrate up pending results.  Since her last visit, her daughter was in a car accident and has a traumatic brain injury.  Her son also passed away unexpectedly.  Her husband's health is failing.  Increased stress related to this.  Discussed with her at length.  She does not feel she needs any further intervention.  dsicussed counseling.  She has good support.  Brought in no recorded sugar readings.  States am sugars averaging 160-170.  She is eating.  Discussed diet and exercise.  No chest pain or tightness.  No sob.  No acid reflux.  Some increased leg swelling.  Worse when sitting in hospital chair.   She does report a persistent dry cough.  No congestion.  No sob.     Past Medical History  Diagnosis Date  . Diabetes mellitus, type II ([place])   . Hypertension   . Hypercholesterolemia   . GERD (gastroesophageal reflux disease)   . IBS (irritable bowel syndrome)   . Gastritis   . B12 deficiency   . Arthritis   . Diabetes mellitus ([place]) 02/17/2012  . Anemia   . Pernicious anemia    Past Surgical History  Procedure Laterality Date  . Breast surgery  1980    benign cyst removal  . Cholecystectomy  1983  . Breast biopsy Right 1980    neg   Family History  Problem Relation Age of Onset  . Diabetes Father   . Kidney disease Father   . Heart disease Father   . Alcohol abuse Mother   . Dementia Mother   . Diabetes Mother   . Hemophilia Brother   . Uterine cancer Sister   . Diabetes Sister   . COPD Sister   . Breast cancer Maternal Aunt     . Diabetes Other   . Heart disease Son 14    CABG  . Breast cancer Daughter 45   Social History   Social History  . Marital Status: Married    Spouse Name: N/A  . Number of Children: 4  . Years of Education: N/A   Social History Main Topics  . Smoking status: Never Smoker   . Smokeless tobacco: Never Used  . Alcohol Use: No  . Drug Use: No  . Sexual Activity: Not Asked   Other Topics Concern  . None   Social History Narrative   Lives with her husband. Has four children. Manager of an apartment building. No tobacco, alcohol or other drug use    Outpatient Encounter Prescriptions as of 02/08/2015  Medication Sig  . amLODipine (NORVASC) 10 MG tablet TAKE ONE TABLET BY MOUTH ONCE DAILY  . aspirin 81 MG tablet Take 81 mg by mouth daily.  Marland Kitchen atenolol (TENORMIN) 100 MG tablet TAKE ONE TABLET BY MOUTH IN THE MORNING AND ONE-HALF IN THE EVENING  . cyanocobalamin (,VITAMIN B-12,) 1000 MCG/ML injection Inject 1,000 mcg into the muscle every 30 (thirty) days.  . ferrous fumarate (HEMOCYTE - 106  MG FE) 325 (106 FE) MG TABS tablet Take 1 tablet by mouth.  . fluticasone (CUTIVATE) 0.05 % cream Use as directed.  Marland Kitchen glipiZIDE (GLUCOTROL XL) 5 MG 24 hr tablet TAKE ONE TABLET BY MOUTH WITH BREAKFAST  . hydrochlorothiazide (HYDRODIURIL) 25 MG tablet TAKE ONE TABLET BY MOUTH ONCE DAILY  . insulin glargine (LANTUS) 100 UNIT/ML injection 20 units q am and 4 units q hs (Patient taking differently: 20 units q am and 5 units q hs)  . insulin lispro ([name]) 100 UNIT/ML injection Inject 12 units before lunch and 12 units before dinner  . Insulin Pen Needle 32G X 8 MM MISC Use as directed three times daily Dx E11.9  . losartan (COZAAR) 100 MG tablet Take 1 tablet (100 mg total) by mouth daily.  . magnesium oxide (MAG-OX) 400 MG tablet TAKE ONE TABLET BY MOUTH ONCE DAILY  . metFORMIN (GLUCOPHAGE) 1000 MG tablet TAKE ONE TABLET BY MOUTH TWICE DAILY WITH MEALS  . omeprazole (PRILOSEC) 20 MG capsule TAKE  ONE CAPSULE BY MOUTH TWICE DAILY  . pioglitazone (ACTOS) 30 MG tablet TAKE ONE TABLET BY MOUTH ONCE DAILY  . simvastatin (ZOCOR) 10 MG tablet Take 1 tablet (10 mg total) by mouth at bedtime.  . [DISCONTINUED] lisinopril (PRINIVIL,ZESTRIL) 20 MG tablet Take 1 tablet (20 mg total) by mouth 2 (two) times daily.  . [EXPIRED] cyanocobalamin ((VITAMIN B-12)) injection 1,000 mcg    No facility-administered encounter medications on file as of 02/08/2015.    Review of Systems  Constitutional: Negative for appetite change and unexpected weight change.  HENT: Negative for congestion and sinus pressure.   Eyes: Negative for discharge and visual disturbance.  Respiratory: Positive for cough (non productive cough.  persistent dry hacking cough. ). Negative for chest tightness and shortness of breath.   Cardiovascular: Positive for leg swelling (better now that she is not sitting in the hospital.  ). Negative for chest pain and palpitations.  Gastrointestinal: Negative for nausea, vomiting and diarrhea.       Bowels stable.   Genitourinary: Negative for dysuria and difficulty urinating.  Musculoskeletal: Negative for back pain and joint swelling.  Skin: Negative for color change and rash.  Neurological: Negative for dizziness, light-headedness and headaches.  Psychiatric/Behavioral:       Increased stress and reactive depression as outlined.         Objective:    Physical Exam  Constitutional: She appears well-developed and well-nourished. No distress.  HENT:  Nose: Nose normal.  Mouth/Throat: Oropharynx is clear and moist.  Eyes: Conjunctivae are normal. Right eye exhibits no discharge. Left eye exhibits no discharge.  Neck: Neck supple. No thyromegaly present.  Cardiovascular: Normal rate and regular rhythm.   Pulmonary/Chest: Breath sounds normal. No respiratory distress. She has no wheezes.  Abdominal: Soft. Bowel sounds are normal. There is no tenderness.  Musculoskeletal: She exhibits no  tenderness.  No increased edema.    Lymphadenopathy:    She has no cervical adenopathy.  Skin: No rash noted. No erythema.  Psychiatric: She has a normal mood and affect. Her behavior is normal.    BP 130/70 mmHg  Pulse 60  Temp(Src) 98.1 F (36.7 C) (Oral)  Resp 18  Ht _0  (1.575 m)  Wt 236 lb (107.049 kg)  BMI 43.15 kg/m2  SpO2 95% Wt Readings from Last 3 Encounters:  02/08/15 236 lb (107.049 kg)  12/29/14 234 lb 12 oz (106.482 kg)  11/15/14 233 lb 4 oz (105.802 kg)  Lab Results  Component Value Date   WBC 8.9 07/07/2014   HGB 12.6 07/07/2014   HCT 38.4 07/07/2014   PLT 217.0 07/07/2014   GLUCOSE 171* 11/10/2014   CHOL 127 11/10/2014   TRIG 123.0 11/10/2014   HDL 49.30 11/10/2014   LDLDIRECT 74.4 02/21/2012   LDLCALC 53 11/10/2014   ALT 21 11/10/2014   AST 16 11/10/2014   NA 142 11/10/2014   K 4.9 11/10/2014   CL 104 11/10/2014   CREATININE 0.83 11/10/2014   BUN 17 11/10/2014   CO2 31 11/10/2014   TSH 1.70 01/19/2014   HGBA1C 9.6* 11/10/2014   MICROALBUR 4.1* 11/10/2014    Mm Digital Screening Bilateral  01/30/2015  CLINICAL DATA:  Screening. EXAM: DIGITAL SCREENING BILATERAL MAMMOGRAM WITH CAD COMPARISON:  Previous exam(s). ACR Breast Density Category b: There are scattered areas of fibroglandular density. FINDINGS: There are no findings suspicious for malignancy. Images were processed with CAD. IMPRESSION: No mammographic evidence of malignancy. A result letter of this screening mammogram will be mailed directly to the patient. RECOMMENDATION: Screening mammogram in one year. (Code:SM-B-01Y) BI-RADS CATEGORY  1: Negative. Electronically Signed   By: Altamese Cabal M.D.   On: 01/30/2015 11:27       Assessment & Plan:   Problem List Items Addressed This Visit    B12 deficiency - Primary    Continue B12 injections.        Relevant Medications   cyanocobalamin ((VITAMIN B-12)) injection 1,000 mcg (Completed)   Bilateral leg edema    Stable.   Compression hose.        Cough    No congestion.  Lungs are clear.  Dry cough.  Stop lisinopril.  Start losartan 153m q da.  Follow pressures.  See if cough resolves.        Diabetes mellitus [place]    Sugars as outlined.  Discussed with her today.  We dicussed titration of the insulin.  Check and record sugars.  Send in over the next 2-3 weeks.  Discussed diet and exercise.  Follow met b and a1c.       Relevant Medications   losartan (COZAAR) 100 MG tablet   Other Relevant Orders   Hemoglobin A1c   Hypercholesterolemia    On simvastatin.  Low cholesterol diet and exercise.  Follow lipid panel and liver function tests.       Relevant Medications   losartan (COZAAR) 100 MG tablet   Other Relevant Orders   Lipid panel   Hepatic function panel   Hypertension    Blood pressure has been well controlled.  Has the persistent dry hacking cough.  Question if related to the ace inhibitor.  Will stop lisinopril.  Start losartan 1031mq day.  Follow pressures.  Follow metabolic panel.       Relevant Medications   losartan (COZAAR) 100 MG tablet   Other Relevant Orders   Basic metabolic panel   IBS (irritable bowel syndrome)    Bowels stable.        Morbid obesity ([place]   Diet and exercise.  Follow.       Stress    Increased stress as outlined.  Discussed at length with her today.  She desires no further intervention at this time.  Discussed counseling.  Has good support.  Follow.        Relevant Orders   TSH       SCEinar PheasantMD

## 2015-02-13 ENCOUNTER — Encounter: Payer: Self-pay | Admitting: Internal Medicine

## 2015-02-13 DIAGNOSIS — R05 Cough: Secondary | ICD-10-CM | POA: Insufficient documentation

## 2015-02-13 DIAGNOSIS — R059 Cough, unspecified: Secondary | ICD-10-CM | POA: Insufficient documentation

## 2015-02-13 NOTE — Assessment & Plan Note (Signed)
Blood pressure has been well controlled.  Has the persistent dry hacking cough.  Question if related to the ace inhibitor.  Will stop lisinopril.  Start losartan 100mg  q day.  Follow pressures.  Follow metabolic panel.

## 2015-02-13 NOTE — Assessment & Plan Note (Signed)
Increased stress as outlined.  Discussed at length with her today.  She desires no further intervention at this time.  Discussed counseling.  Has good support.  Follow.

## 2015-02-13 NOTE — Assessment & Plan Note (Signed)
On simvastatin.  Low cholesterol diet and exercise.  Follow lipid panel and liver function tests.   

## 2015-02-13 NOTE — Assessment & Plan Note (Signed)
Sugars as outlined.  Discussed with her today.  We dicussed titration of the insulin.  Check and record sugars.  Send in over the next 2-3 weeks.  Discussed diet and exercise.  Follow met b and a1c.

## 2015-02-13 NOTE — Assessment & Plan Note (Signed)
No congestion.  Lungs are clear.  Dry cough.  Stop lisinopril.  Start losartan 100mg  q da.  Follow pressures.  See if cough resolves.

## 2015-02-13 NOTE — Assessment & Plan Note (Signed)
Stable.  Compression hose.   

## 2015-02-13 NOTE — Assessment & Plan Note (Signed)
Bowels stable.  

## 2015-02-13 NOTE — Assessment & Plan Note (Signed)
Diet and exercise.  Follow.  

## 2015-02-13 NOTE — Assessment & Plan Note (Signed)
Continue B12 injections.   

## 2015-03-15 ENCOUNTER — Other Ambulatory Visit: Payer: Self-pay | Admitting: Internal Medicine

## 2015-03-16 ENCOUNTER — Encounter: Payer: Self-pay | Admitting: Internal Medicine

## 2015-03-17 MED ORDER — LISINOPRIL 20 MG PO TABS: ORAL_TABLET | ORAL | Status: DC

## 2015-03-17 NOTE — Telephone Encounter (Signed)
rx sent in for lisinopril #90 with 3 refills.  Directions corrected.

## 2015-03-19 ENCOUNTER — Other Ambulatory Visit: Payer: Self-pay | Admitting: Internal Medicine

## 2015-03-28 ENCOUNTER — Encounter: Payer: Self-pay | Admitting: Internal Medicine

## 2015-03-31 ENCOUNTER — Encounter: Payer: Self-pay | Admitting: *Deleted

## 2015-04-06 ENCOUNTER — Other Ambulatory Visit: Payer: Self-pay | Admitting: Internal Medicine

## 2015-04-06 ENCOUNTER — Other Ambulatory Visit (INDEPENDENT_AMBULATORY_CARE_PROVIDER_SITE_OTHER): Payer: PPO

## 2015-04-06 ENCOUNTER — Other Ambulatory Visit: Payer: Self-pay

## 2015-04-06 ENCOUNTER — Ambulatory Visit (INDEPENDENT_AMBULATORY_CARE_PROVIDER_SITE_OTHER): Payer: PPO

## 2015-04-06 VITALS — BP 122/62 | HR 62 | Temp 97.4°F | Resp 14 | Ht 62.0 in | Wt 233.8 lb

## 2015-04-06 DIAGNOSIS — E7800 Pure hypercholesterolemia, unspecified: Secondary | ICD-10-CM

## 2015-04-06 DIAGNOSIS — E538 Deficiency of other specified B group vitamins: Secondary | ICD-10-CM

## 2015-04-06 DIAGNOSIS — I10 Essential (primary) hypertension: Secondary | ICD-10-CM

## 2015-04-06 DIAGNOSIS — Z658 Other specified problems related to psychosocial circumstances: Secondary | ICD-10-CM

## 2015-04-06 DIAGNOSIS — Z794 Long term (current) use of insulin: Secondary | ICD-10-CM

## 2015-04-06 DIAGNOSIS — Z1159 Encounter for screening for other viral diseases: Secondary | ICD-10-CM

## 2015-04-06 DIAGNOSIS — Z0000 Encounter for general adult medical examination without abnormal findings: Secondary | ICD-10-CM

## 2015-04-06 DIAGNOSIS — E1140 Type 2 diabetes mellitus with diabetic neuropathy, unspecified: Secondary | ICD-10-CM

## 2015-04-06 DIAGNOSIS — F439 Reaction to severe stress, unspecified: Secondary | ICD-10-CM

## 2015-04-06 DIAGNOSIS — E2839 Other primary ovarian failure: Secondary | ICD-10-CM

## 2015-04-06 DIAGNOSIS — Z13820 Encounter for screening for osteoporosis: Secondary | ICD-10-CM

## 2015-04-06 MED ORDER — CYANOCOBALAMIN 1000 MCG/ML IJ SOLN
1000.0000 ug | Freq: Once | INTRAMUSCULAR | Status: AC
  Administered 2015-04-06: 1000 ug via INTRAMUSCULAR

## 2015-04-06 NOTE — Patient Instructions (Addendum)
Beth Tyler,  Thank you for taking time to come for your Medicare Wellness Visit.  I appreciate your ongoing commitment to your health goals. Please review the following plan we discussed and let me know if I can assist you in the future.  Happy New Year!  Fall Prevention in the Home  Falls can cause injuries. They can happen to people of all ages. There are many things you can do to make your home safe and to help prevent falls.  WHAT CAN I DO ON THE OUTSIDE OF MY HOME?  Regularly fix the edges of walkways and driveways and fix any cracks.  Remove anything that might make you trip as you walk through a door, such as a raised step or threshold.  Trim any bushes or trees on the path to your home.  Use bright outdoor lighting.  Clear any walking paths of anything that might make someone trip, such as rocks or tools.  Regularly check to see if handrails are loose or broken. Make sure that both sides of any steps have handrails.  Any raised decks and porches should have guardrails on the edges.  Have any leaves, snow, or ice cleared regularly.  Use sand or salt on walking paths during winter.  Clean up any spills in your garage right away. This includes oil or grease spills. WHAT CAN I DO IN THE BATHROOM?   Use night lights.  Install grab bars by the toilet and in the tub and shower. Do not use towel bars as grab bars.  Use non-skid mats or decals in the tub or shower.  If you need to sit down in the shower, use a plastic, non-slip stool.  Keep the floor dry. Clean up any water that spills on the floor as soon as it happens.  Remove soap buildup in the tub or shower regularly.  Attach bath mats securely with double-sided non-slip rug tape.  Do not have throw rugs and other things on the floor that can make you trip. WHAT CAN I DO IN THE BEDROOM?  Use night lights.  Make sure that you have a light by your bed that is easy to reach.  Do not use any sheets or blankets  that are too big for your bed. They should not hang down onto the floor.  Have a firm chair that has side arms. You can use this for support while you get dressed.  Do not have throw rugs and other things on the floor that can make you trip. WHAT CAN I DO IN THE KITCHEN?  Clean up any spills right away.  Avoid walking on wet floors.  Keep items that you use a lot in easy-to-reach places.  If you need to reach something above you, use a strong step stool that has a grab bar.  Keep electrical cords out of the way.  Do not use floor polish or wax that makes floors slippery. If you must use wax, use non-skid floor wax.  Do not have throw rugs and other things on the floor that can make you trip. WHAT CAN I DO WITH MY STAIRS?  Do not leave any items on the stairs.  Make sure that there are handrails on both sides of the stairs and use them. Fix handrails that are broken or loose. Make sure that handrails are as long as the stairways.  Check any carpeting to make sure that it is firmly attached to the stairs. Fix any carpet that is loose or  worn.  Avoid having throw rugs at the top or bottom of the stairs. If you do have throw rugs, attach them to the floor with carpet tape.  Make sure that you have a light switch at the top of the stairs and the bottom of the stairs. If you do not have them, ask someone to add them for you. WHAT ELSE CAN I DO TO HELP PREVENT FALLS?  Wear shoes that:  Do not have high heels.  Have rubber bottoms.  Are comfortable and fit you well.  Are closed at the toe. Do not wear sandals.  If you use a stepladder:  Make sure that it is fully opened. Do not climb a closed stepladder.  Make sure that both sides of the stepladder are locked into place.  Ask someone to hold it for you, if possible.  Clearly mark and make sure that you can see:  Any grab bars or handrails.  First and last steps.  Where the edge of each step is.  Use tools that help  you move around (mobility aids) if they are needed. These include:  Canes.  Walkers.  Scooters.  Crutches.  Turn on the lights when you go into a dark area. Replace any light bulbs as soon as they burn out.  Set up your furniture so you have a clear path. Avoid moving your furniture around.  If any of your floors are uneven, fix them.  If there are any pets around you, be aware of where they are.  Review your medicines with your doctor. Some medicines can make you feel dizzy. This can increase your chance of falling. Ask your doctor what other things that you can do to help prevent falls.   This information is not intended to replace advice given to you by your health care provider. Make sure you discuss any questions you have with your health care provider.   Document Released: 01/19/2009 Document Revised: 08/09/2014 Document Reviewed: 04/29/2014 Elsevier Interactive Patient Education [employer]

## 2015-04-06 NOTE — Progress Notes (Signed)
Order placed for bone density - dx estrogen deficiency.

## 2015-04-06 NOTE — Progress Notes (Signed)
Subjective:   Beth Tyler is a 69 y.o. female who presents for an Initial Medicare Annual Wellness Visit.  Review of Systems    No ROS.  Medicare Wellness Visit.  Cardiac Risk Factors include: advanced age (>6155men, 29>65 women);diabetes mellitus;hypertension     Objective:    Today's Vitals   04/06/15 0902  BP: 122/62  Pulse: 62  Temp: 97.4 F (36.3 C)  TempSrc: Oral  Resp: 14  Height: 5\' 2"  (1.575 m)  Weight: 233 lb 12.8 oz (106.051 kg)  SpO2: 98%    Current Medications (verified) Outpatient Encounter Prescriptions as of 04/06/2015  Medication Sig  . amLODipine (NORVASC) 10 MG tablet TAKE ONE TABLET BY MOUTH ONCE DAILY  . aspirin 81 MG tablet Take 81 mg by mouth daily.  Marland Kitchen. atenolol (TENORMIN) 100 MG tablet TAKE ONE TABLET BY MOUTH IN THE MORNING AND ONE-HALF IN THE EVENING  . cyanocobalamin (,VITAMIN B-12,) 1000 MCG/ML injection Inject 1,000 mcg into the muscle every 30 (thirty) days.  . ferrous fumarate (HEMOCYTE - 106 MG FE) 325 (106 FE) MG TABS tablet Take 1 tablet by mouth.  . fluticasone (CUTIVATE) 0.05 % cream Use as directed.  Marland Kitchen. glipiZIDE (GLUCOTROL XL) 5 MG 24 hr tablet TAKE ONE TABLET BY MOUTH WITH BREAKFAST  . hydrochlorothiazide (HYDRODIURIL) 25 MG tablet TAKE ONE TABLET BY MOUTH ONCE DAILY  . insulin glargine (LANTUS) 100 UNIT/ML injection 20 units q am and 4 units q hs (Patient taking differently: 20 units q am and 5 units q hs)  . Insulin Pen Needle 32G X 8 MM MISC Use as directed three times daily Dx E11.9  . lisinopril (PRINIVIL,ZESTRIL) 20 MG tablet Take two tablets in the morning and one in the evening.  . magnesium oxide (MAG-OX) 400 MG tablet TAKE ONE TABLET BY MOUTH ONCE DAILY  . metFORMIN (GLUCOPHAGE) 1000 MG tablet TAKE ONE TABLET BY MOUTH TWICE DAILY WITH MEALS  . omeprazole (PRILOSEC) 20 MG capsule TAKE ONE CAPSULE BY MOUTH TWICE DAILY  . pioglitazone (ACTOS) 30 MG tablet TAKE ONE TABLET BY MOUTH ONCE DAILY  . simvastatin (ZOCOR) 10 MG tablet  Take 1 tablet (10 mg total) by mouth at bedtime.  . insulin lispro ([name]) 100 UNIT/ML injection Inject 12 units before lunch and 12 units before dinner (Patient not taking: Reported on 04/06/2015)  . [EXPIRED] cyanocobalamin ((VITAMIN B-12)) injection 1,000 mcg    No facility-administered encounter medications on file as of 04/06/2015.    Allergies (verified) Hydrocodone-acetaminophen; Percocet; Penicillins; and Tetracyclines & related   History: Past Medical History  Diagnosis Date  . Diabetes mellitus, type II ([facility])   . Hypertension   . Hypercholesterolemia   . GERD (gastroesophageal reflux disease)   . IBS (irritable bowel syndrome)   . Gastritis   . B12 deficiency   . Arthritis   . Diabetes mellitus ([facility]) 02/17/2012  . Anemia   . Pernicious anemia    Past Surgical History  Procedure Laterality Date  . Breast surgery  1980    benign cyst removal  . Cholecystectomy  1983  . Breast biopsy Right 1980    neg   Family History  Problem Relation Age of Onset  . Diabetes Father   . Kidney disease Father   . Heart disease Father   . Alcohol abuse Mother   . Dementia Mother   . Diabetes Mother   . Hemophilia Brother   . Uterine cancer Sister   . Diabetes Sister   . COPD Sister   .  Breast cancer Maternal Aunt   . Diabetes Other   . Heart disease Son 20    CABG  . Breast cancer Daughter 79   Social History   Occupational History  . Not on file.   Social History Main Topics  . Smoking status: Never Smoker   . Smokeless tobacco: Never Used  . Alcohol Use: No  . Drug Use: No  . Sexual Activity: Not Currently    Tobacco Counseling Counseling given: Not Answered   Activities of Daily Living In your present state of health, do you have any difficulty performing the following activities: 04/06/2015  Hearing? N  Vision? N  Difficulty concentrating or making decisions? N  Walking or climbing stairs? Y  Dressing or bathing? N  Doing errands, shopping? N    Preparing Food and eating ? N  Using the Toilet? N  In the past six months, have you accidently leaked urine? N  Do you have problems with loss of bowel control? Y  Managing your Medications? N  Managing your Finances? N  Housekeeping or managing your Housekeeping? N    Immunizations and Health Maintenance Immunization History  Administered Date(s) Administered  . Influenza Split 02/04/2012  . Influenza,inj,Quad PF,36+ Mos 01/22/2013, 01/19/2014, 12/29/2014  . Pneumococcal Conjugate-13 02/06/2010, 06/11/2013   Health Maintenance Due  Topic Date Due  . Hepatitis C Screening  1946/02/01  . TETANUS/TDAP  09/24/1963  . ZOSTAVAX  09/23/2004  . DEXA SCAN  09/23/2009  . PNA vac Low Risk Adult (2 of 2 - PPSV23) 06/12/2014  . OPHTHALMOLOGY EXAM  03/30/2015    Patient Care Team: Dale [place], MD as PCP - General (Internal Medicine)  Indicate any recent Medical Services you may have received from other than [facility] providers in the past year (date may be approximate).     Assessment:   This is a routine wellness examination for Beth Tyler. The goal of the wellness visit is to assist the patient how to close the gaps in care and create a preventative care plan for the patient.   Osteoporosis risk reviewed.  DEXA Scan referral placed.  Medications reviewed; taking without issues or barriers.   Safety issues reviewed; smoke detectors in the home. No firearms in the home. Wears seatbelts when driving or riding with others. No violence in the home.  No identified risk were noted; The patient was oriented x 3; appropriate in dress and manner and no objective failures at ADL's or IADL's.   B12 injection administered for deficiency, per previous orders.  Pneumococcal Polysaccharide Vaccine 23 postponed for upcoming follow up with PCP, per patient request.  Hep C Screening future labs placed.  ZOSTAVAX vaccine postponed, she reports not currently a candidate.  TDAP vaccine postponed  for insurance follow up.  Education provided.  Patient Concerns:  None at this time.  Follow up with PCP as needed.   Hearing/Vision screen Hearing Screening Comments: Followed by Dr.Jengal Passes the [facility] Vision Screening Comments: Followed by Dr. Senaida Ores Annual visits Wears glasses  Dietary issues and exercise activities discussed: Current Exercise Habits:: Home exercise routine, Type of exercise: walking, Time (Minutes): 30, Frequency (Times/Week): 4, Weekly Exercise (Minutes/Week): 120, Intensity: Mild  Goals    . Healthy lifestyle     Incorporate more vegetables into diet, continue walking regiment and keep drinking plenty of water.      Depression Screen PHQ 2/9 Scores 04/06/2015 10/15/2013 06/28/2012 03/15/2012  PHQ - 2 Score 0 0 0 0    Fall Risk Fall Risk  04/06/2015 10/15/2013 06/28/2012 03/15/2012  Falls in the past year? Yes No No No  Number falls in past yr: 2 or more - - -  Injury with Fall? No - - -  Follow up Education provided;Falls prevention discussed - - -    Cognitive Function: MMSE - Mini Mental State Exam 04/06/2015  Orientation to time 5  Orientation to Place 5  Registration 3  Attention/ Calculation 5  Recall 3  Language- name 2 objects 2  Language- repeat 1  Language- follow 3 step command 3  Language- read & follow direction 1  Write a sentence 1  Copy design 1  Total score 30    Screening Tests Health Maintenance  Topic Date Due  . Hepatitis C Screening  17-Jan-1946  . TETANUS/TDAP  09/24/1963  . ZOSTAVAX  09/23/2004  . DEXA SCAN  09/23/2009  . PNA vac Low Risk Adult (2 of 2 - PPSV23) 06/12/2014  . OPHTHALMOLOGY EXAM  03/30/2015  . HEMOGLOBIN A1C  05/13/2015  . INFLUENZA VACCINE  11/07/2015  . MAMMOGRAM  01/30/2016  . FOOT EXAM  02/08/2016  . COLONOSCOPY  11/28/2021      Plan:   End of life planning; Advance aging; Advanced directives discussed. Copy requested of current HCPOA/Living Will.   Return in January for fasting  labs and follow up.  During the course of the visit, Beth Tyler was educated and counseled about the following appropriate screening and preventive services:   Vaccines to include Pneumoccal, Influenza, Hepatitis B, Td, Zostavax, HCV  Electrocardiogram  Cardiovascular disease screening  Colorectal cancer screening  Bone density screening  Diabetes screening  Glaucoma screening  Mammography/PAP  Nutrition counseling  Smoking cessation counseling  Patient Instructions (the written plan) were given to the patient.    Ashok Pall, LPN   81/19/1478    Reviewed above information.  Agree with plan.    Dr Lorin Picket

## 2015-04-11 ENCOUNTER — Other Ambulatory Visit: Payer: Self-pay | Admitting: Internal Medicine

## 2015-04-11 ENCOUNTER — Other Ambulatory Visit (INDEPENDENT_AMBULATORY_CARE_PROVIDER_SITE_OTHER): Payer: Medicare HMO

## 2015-04-11 ENCOUNTER — Encounter: Payer: Self-pay | Admitting: Internal Medicine

## 2015-04-11 ENCOUNTER — Ambulatory Visit (INDEPENDENT_AMBULATORY_CARE_PROVIDER_SITE_OTHER): Payer: Self-pay | Admitting: Internal Medicine

## 2015-04-11 VITALS — BP 120/60 | HR 62 | Temp 98.1°F | Resp 18 | Ht 62.0 in | Wt 233.5 lb

## 2015-04-11 DIAGNOSIS — Z1159 Encounter for screening for other viral diseases: Secondary | ICD-10-CM | POA: Diagnosis not present

## 2015-04-11 DIAGNOSIS — I10 Essential (primary) hypertension: Secondary | ICD-10-CM

## 2015-04-11 DIAGNOSIS — E1140 Type 2 diabetes mellitus with diabetic neuropathy, unspecified: Secondary | ICD-10-CM | POA: Diagnosis not present

## 2015-04-11 DIAGNOSIS — E538 Deficiency of other specified B group vitamins: Secondary | ICD-10-CM

## 2015-04-11 DIAGNOSIS — F439 Reaction to severe stress, unspecified: Secondary | ICD-10-CM

## 2015-04-11 DIAGNOSIS — R059 Cough, unspecified: Secondary | ICD-10-CM

## 2015-04-11 DIAGNOSIS — Z658 Other specified problems related to psychosocial circumstances: Secondary | ICD-10-CM

## 2015-04-11 DIAGNOSIS — K589 Irritable bowel syndrome without diarrhea: Secondary | ICD-10-CM

## 2015-04-11 DIAGNOSIS — R05 Cough: Secondary | ICD-10-CM

## 2015-04-11 DIAGNOSIS — R600 Localized edema: Secondary | ICD-10-CM

## 2015-04-11 DIAGNOSIS — E7800 Pure hypercholesterolemia, unspecified: Secondary | ICD-10-CM

## 2015-04-11 DIAGNOSIS — Z794 Long term (current) use of insulin: Secondary | ICD-10-CM

## 2015-04-11 NOTE — Progress Notes (Signed)
Pre-visit discussion using our clinic review tool. No additional management support is needed unless otherwise documented below in the visit note.  

## 2015-04-11 NOTE — Progress Notes (Signed)
Patient ID: Beth Tyler, female   DOB: [birth year], 70 y.o.   MRN: [id number]   Subjective:    Patient ID: Beth Tyler, female    DOB: 02/02/1946, 70 y.o.   MRN: [id number]  HPI  Patient with past history of hypercholesterolemia, diabetes, hypertension and IBS.  She comes in today to follow up on these issues.  Still dealing with increased stress.  Her daughter is doing better.  Still trying to cope with the recent death of her son.  Her husband's health is declining.  She has good support.  Does not feel she needs any further intervention.  Breathing stable.  Bowels stable.  No cardiac symptoms reported - with increased activity or exertion.  Tries to stay active.  States sugars are still elevated.  AM sugars averaging 160s and pm sugars 215-220.  Cost is an issue for her.  On insulin.  Adjusting dose.  Have added pm Lantus.  Still with some cough.  Tried to change her lisinopril.  Blood pressure elevated on losartan.  Back on lisinopril now.  She feels cough related to drainage.     Past Medical History  Diagnosis Date  . Diabetes mellitus, type II (HCC)   . Hypertension   . Hypercholesterolemia   . GERD (gastroesophageal reflux disease)   . IBS (irritable bowel syndrome)   . Gastritis   . B12 deficiency   . Arthritis   . Diabetes mellitus (HCC) 02/17/2012  . Anemia   . Pernicious anemia    Past Surgical History  Procedure Laterality Date  . Breast surgery  1980    benign cyst removal  . Cholecystectomy  1983  . Breast biopsy Right 1980    neg   Family History  Problem Relation Age of Onset  . Diabetes Father   . Kidney disease Father   . Heart disease Father   . Alcohol abuse Mother   . Dementia Mother   . Diabetes Mother   . Hemophilia Brother   . Uterine cancer Sister   . Diabetes Sister   . COPD Sister   . Breast cancer Maternal Aunt   . Diabetes Other   . Heart disease Son 4    CABG  . Breast cancer Daughter 47   Social History   Social History  .  Marital Status: Married    Spouse Name: N/A  . Number of Children: 4  . Years of Education: N/A   Social History Main Topics  . Smoking status: Never Smoker   . Smokeless tobacco: Never Used  . Alcohol Use: No  . Drug Use: No  . Sexual Activity: Not Currently   Other Topics Concern  . None   Social History Narrative   Lives with her husband. Has four children. Manager of an apartment building. No tobacco, alcohol or other drug use    Outpatient Encounter Prescriptions as of 04/11/2015  Medication Sig  . amLODipine (NORVASC) 10 MG tablet TAKE ONE TABLET BY MOUTH ONCE DAILY  . aspirin 81 MG tablet Take 81 mg by mouth daily.  Marland Kitchen atenolol (TENORMIN) 100 MG tablet TAKE ONE TABLET BY MOUTH IN THE MORNING AND ONE-HALF IN THE EVENING (Patient taking differently: TAKE ONE-HALF TABLET BY MOUTH IN THE MORNING AND ONE IN THE EVENING)  . cyanocobalamin (,VITAMIN B-12,) 1000 MCG/ML injection Inject 1,000 mcg into the muscle every 30 (thirty) days.  . ferrous fumarate (HEMOCYTE - 106 MG FE) 325 (106 FE) MG TABS tablet Take 1 tablet by mouth.  Marland Kitchen  fluticasone (CUTIVATE) 0.05 % cream Use as directed.  Marland Kitchen glipiZIDE (GLUCOTROL XL) 5 MG 24 hr tablet TAKE ONE TABLET BY MOUTH WITH BREAKFAST  . hydrochlorothiazide (HYDRODIURIL) 25 MG tablet TAKE ONE TABLET BY MOUTH ONCE DAILY  . insulin glargine (LANTUS) 100 UNIT/ML injection 20 units q am and 4 units q hs (Patient taking differently: 20 units q am and 8 units q hs)  . Insulin Pen Needle 32G X 8 MM MISC Use as directed three times daily Dx E11.9  . lisinopril (PRINIVIL,ZESTRIL) 20 MG tablet Take two tablets in the morning and one in the evening. (Patient taking differently: Take one tablet in the morning and two in the evening.)  . magnesium oxide (MAG-OX) 400 MG tablet TAKE ONE TABLET BY MOUTH ONCE DAILY  . metFORMIN (GLUCOPHAGE) 1000 MG tablet TAKE ONE TABLET BY MOUTH TWICE DAILY WITH MEALS  . omeprazole (PRILOSEC) 20 MG capsule TAKE ONE CAPSULE BY MOUTH  TWICE DAILY  . pioglitazone (ACTOS) 30 MG tablet TAKE ONE TABLET BY MOUTH ONCE DAILY  . simvastatin (ZOCOR) 10 MG tablet Take 1 tablet (10 mg total) by mouth at bedtime.  . insulin lispro ([name]) 100 UNIT/ML injection Inject 12 units before lunch and 12 units before dinner (Patient not taking: Reported on 04/11/2015)   No facility-administered encounter medications on file as of 04/11/2015.    Review of Systems  Constitutional: Negative for appetite change and unexpected weight change.  HENT: Positive for postnasal drip. Negative for congestion and sinus pressure.   Eyes: Negative for discharge and redness.  Respiratory: Positive for cough. Negative for chest tightness and shortness of breath.   Cardiovascular: Positive for leg swelling (notices some swelling if stands for a while. ). Negative for chest pain and palpitations.  Gastrointestinal: Negative for nausea, vomiting and abdominal pain.       Bowels stable.   Genitourinary: Negative for dysuria and difficulty urinating.  Musculoskeletal: Negative for back pain and joint swelling.  Skin: Negative for wound.  Neurological: Negative for dizziness, light-headedness and headaches.  Psychiatric/Behavioral: Negative for dysphoric mood and agitation.       Objective:     Blood pressure rechecked by me:  128/68  Physical Exam  Constitutional: She appears well-developed and well-nourished. No distress.  HENT:  Nose: Nose normal.  Mouth/Throat: Oropharynx is clear and moist.  Eyes: Conjunctivae are normal. Right eye exhibits no discharge. Left eye exhibits no discharge.  Neck: Neck supple. No thyromegaly present.  Cardiovascular: Normal rate and regular rhythm.   Pulmonary/Chest: Breath sounds normal. No respiratory distress. She has no wheezes.  Abdominal: Soft. Bowel sounds are normal. There is no tenderness.  Musculoskeletal: She exhibits no edema or tenderness.  Lymphadenopathy:    She has no cervical adenopathy.  Skin: Skin is  warm and dry.  Minimal stasis changes noted - bilateral lower extremities.   Psychiatric: She has a normal mood and affect. Her behavior is normal.    BP 120/60 mmHg  Pulse 62  Temp(Src) 98.1 F (36.7 C) (Oral)  Resp 18  Ht 5\' 2"  (1.575 m)  Wt 233 lb 8 oz (105.915 kg)  BMI 42.70 kg/m2  SpO2 94% Wt Readings from Last 3 Encounters:  04/11/15 233 lb 8 oz (105.915 kg)  04/06/15 233 lb 12.8 oz (106.051 kg)  02/08/15 236 lb (107.049 kg)     Lab Results  Component Value Date   WBC 8.9 07/07/2014   HGB 12.6 07/07/2014   HCT 38.4 07/07/2014   PLT 217.0 07/07/2014  GLUCOSE 171* 11/10/2014   CHOL 127 11/10/2014   TRIG 123.0 11/10/2014   HDL 49.30 11/10/2014   LDLDIRECT 74.4 02/21/2012   LDLCALC 53 11/10/2014   ALT 21 11/10/2014   AST 16 11/10/2014   NA 142 11/10/2014   K 4.9 11/10/2014   CL 104 11/10/2014   CREATININE 0.83 11/10/2014   BUN 17 11/10/2014   CO2 31 11/10/2014   TSH 1.70 01/19/2014   HGBA1C 9.6* 11/10/2014   MICROALBUR 4.1* 11/10/2014    Mm Digital Screening Bilateral  01/30/2015  CLINICAL DATA:  Screening. EXAM: DIGITAL SCREENING BILATERAL MAMMOGRAM WITH CAD COMPARISON:  Previous exam(s). ACR Breast Density Category b: There are scattered areas of fibroglandular density. FINDINGS: There are no findings suspicious for malignancy. Images were processed with CAD. IMPRESSION: No mammographic evidence of malignancy. A result letter of this screening mammogram will be mailed directly to the patient. RECOMMENDATION: Screening mammogram in one year. (Code:SM-B-01Y) BI-RADS CATEGORY  1: Negative. Electronically Signed   By: Rolla Plateandolph  Jackson M.D.   On: 01/30/2015 11:27       Assessment & Plan:   Problem List Items Addressed This Visit    B12 deficiency    Continue B12 injections.       Bilateral leg edema    Stable.  Unable to get the compression hose off.  Leg elevation when possible.       Cough    She is back on lisinopril now.  Still with minimal cough.   She feels related to drainage.  Add nasacort.  Follow.  Notify me if persistent.  Lungs clear.       Diabetes mellitus (HCC)    Sugars as outlined.  Discussed diet and exercise.  Will increase am lantus to 22 units.  If sugars remain in the >200 after several days, titrate up to 24 units in the morning.  Send in readings over the next 2-3 weeks.  Cost is an issue.  Continue same regimen for now.  Follow metabolic panel and a1c.       Hypercholesterolemia    On simvastatin.  Low cholesterol diet and exercise.  Follow lipid panel and liver function tests.        Hypertension - Primary    Blood pressure doing better now that she is back on lisinopril.  Follow pressures.  Follow metabolic panel.        IBS (irritable bowel syndrome)    Bowels stable.       Stress    Increased stress as outlined.  Discussed with her today.  She does not feel she needs any further intervention.  Follow.  Has good support.           Dale DurhamSCOTT, Alphonso Gregson, MD

## 2015-04-11 NOTE — Patient Instructions (Signed)
nasacort nasal spray - 2 sprays each nostril one time per day.  Do this in the evening.   

## 2015-04-12 ENCOUNTER — Encounter: Payer: Self-pay | Admitting: Internal Medicine

## 2015-04-12 LAB — HEPATIC FUNCTION PANEL
ALBUMIN: 4.3 g/dL (ref 3.6–5.1)
ALT: 19 U/L (ref 6–29)
AST: 16 U/L (ref 10–35)
Alkaline Phosphatase: 72 U/L (ref 33–130)
Bilirubin, Direct: 0.1 mg/dL (ref <=0.2)
Indirect Bilirubin: 0.3 mg/dL (ref 0.2–1.2)
TOTAL PROTEIN: 6.8 g/dL (ref 6.1–8.1)
Total Bilirubin: 0.4 mg/dL (ref 0.2–1.2)

## 2015-04-12 LAB — HEPATITIS C ANTIBODY: HCV AB: NEGATIVE

## 2015-04-12 LAB — LIPID PANEL
CHOL/HDL RATIO: 2.3 ratio (ref <=5.0)
Cholesterol: 126 mg/dL (ref 125–200)
HDL: 55 mg/dL (ref >=46)
LDL CALC: 50 mg/dL (ref <130)
Triglycerides: 107 mg/dL (ref <150)
VLDL: 21 mg/dL (ref <30)

## 2015-04-12 LAB — TSH

## 2015-04-12 LAB — HEMOGLOBIN A1C
HEMOGLOBIN A1C: 9.7 % — AB (ref <5.7)
MEAN PLASMA GLUCOSE: 232 mg/dL — AB (ref <117)

## 2015-04-12 LAB — BASIC METABOLIC PANEL
BUN: 15 mg/dL (ref 7–25)
CALCIUM: 9.7 mg/dL (ref 8.6–10.4)
CO2: 23 mmol/L (ref 20–31)
CREATININE: 0.85 mg/dL (ref 0.60–0.93)
Chloride: 105 mmol/L (ref 98–110)
Glucose, Bld: 115 mg/dL — ABNORMAL HIGH (ref 65–99)
Potassium: 5 mmol/L (ref 3.5–5.3)
Sodium: 143 mmol/L (ref 135–146)

## 2015-04-12 NOTE — Assessment & Plan Note (Signed)
Stable.  Unable to get the compression hose off.  Leg elevation when possible.

## 2015-04-12 NOTE — Assessment & Plan Note (Signed)
Bowels stable.  

## 2015-04-12 NOTE — Assessment & Plan Note (Signed)
Continue B12 injections.   

## 2015-04-12 NOTE — Assessment & Plan Note (Signed)
On simvastatin.  Low cholesterol diet and exercise.  Follow lipid panel and liver function tests.   

## 2015-04-12 NOTE — Assessment & Plan Note (Signed)
Blood pressure doing better now that she is back on lisinopril.  Follow pressures.  Follow metabolic panel.

## 2015-04-12 NOTE — Assessment & Plan Note (Signed)
She is back on lisinopril now.  Still with minimal cough.  She feels related to drainage.  Add nasacort.  Follow.  Notify me if persistent.  Lungs clear.

## 2015-04-12 NOTE — Assessment & Plan Note (Signed)
Increased stress as outlined.  Discussed with her today.  She does not feel she needs any further intervention.  Follow.  Has good support.

## 2015-04-12 NOTE — Assessment & Plan Note (Signed)
Sugars as outlined.  Discussed diet and exercise.  Will increase am lantus to 22 units.  If sugars remain in the >200 after several days, titrate up to 24 units in the morning.  Send in readings over the next 2-3 weeks.  Cost is an issue.  Continue same regimen for now.  Follow metabolic panel and a1c.

## 2015-04-13 ENCOUNTER — Encounter: Payer: Self-pay | Admitting: Internal Medicine

## 2015-04-18 ENCOUNTER — Other Ambulatory Visit: Payer: Self-pay | Admitting: Internal Medicine

## 2015-04-19 ENCOUNTER — Other Ambulatory Visit: Payer: Self-pay | Admitting: Internal Medicine

## 2015-04-19 ENCOUNTER — Other Ambulatory Visit: Payer: Self-pay

## 2015-04-19 MED ORDER — FLUTICASONE PROPIONATE 0.05 % EX CREA: TOPICAL_CREAM | CUTANEOUS | Status: DC

## 2015-04-19 NOTE — Telephone Encounter (Signed)
Please advise? Medication refilled in 2015.

## 2015-04-19 NOTE — Telephone Encounter (Signed)
Using it just as needed and is just using it just when the white spots show up on her face.

## 2015-04-19 NOTE — Telephone Encounter (Signed)
Patient does not have a voicemail that is set up yet.

## 2015-04-19 NOTE — Telephone Encounter (Signed)
ok'd refill x 1.  rx sent in to [facility].  Thanks

## 2015-04-19 NOTE — Telephone Encounter (Signed)
Need to clarify with pt how often she uses this - when uses and what she is using for.  Thanks

## 2015-04-25 ENCOUNTER — Ambulatory Visit: Admission: RE | Admit: 2015-04-25 | Payer: PPO | Source: Ambulatory Visit

## 2015-05-01 ENCOUNTER — Other Ambulatory Visit: Payer: Self-pay | Admitting: Internal Medicine

## 2015-05-07 ENCOUNTER — Encounter: Payer: Self-pay | Admitting: Internal Medicine

## 2015-05-10 ENCOUNTER — Other Ambulatory Visit: Payer: Self-pay | Admitting: Internal Medicine

## 2015-05-23 ENCOUNTER — Encounter: Payer: Self-pay | Admitting: Internal Medicine

## 2015-05-23 ENCOUNTER — Ambulatory Visit (INDEPENDENT_AMBULATORY_CARE_PROVIDER_SITE_OTHER): Payer: Medicare HMO | Admitting: Internal Medicine

## 2015-05-23 VITALS — BP 130/60 | HR 64 | Temp 98.1°F | Resp 18 | Ht 62.0 in | Wt 235.1 lb

## 2015-05-23 DIAGNOSIS — M549 Dorsalgia, unspecified: Secondary | ICD-10-CM | POA: Insufficient documentation

## 2015-05-23 DIAGNOSIS — M5441 Lumbago with sciatica, right side: Secondary | ICD-10-CM

## 2015-05-23 DIAGNOSIS — E538 Deficiency of other specified B group vitamins: Secondary | ICD-10-CM

## 2015-05-23 DIAGNOSIS — E1140 Type 2 diabetes mellitus with diabetic neuropathy, unspecified: Secondary | ICD-10-CM

## 2015-05-23 DIAGNOSIS — Z794 Long term (current) use of insulin: Secondary | ICD-10-CM

## 2015-05-23 DIAGNOSIS — F439 Reaction to severe stress, unspecified: Secondary | ICD-10-CM

## 2015-05-23 DIAGNOSIS — M25551 Pain in right hip: Secondary | ICD-10-CM | POA: Diagnosis not present

## 2015-05-23 DIAGNOSIS — I10 Essential (primary) hypertension: Secondary | ICD-10-CM | POA: Diagnosis not present

## 2015-05-23 DIAGNOSIS — Z658 Other specified problems related to psychosocial circumstances: Secondary | ICD-10-CM

## 2015-05-23 DIAGNOSIS — E7800 Pure hypercholesterolemia, unspecified: Secondary | ICD-10-CM

## 2015-05-23 MED ORDER — CYANOCOBALAMIN 1000 MCG/ML IJ SOLN
1000.0000 ug | Freq: Once | INTRAMUSCULAR | Status: AC
  Administered 2015-05-23: 1000 ug via INTRAMUSCULAR

## 2015-05-23 NOTE — Progress Notes (Signed)
Patient ID: Beth Tyler, female   DOB: [birth year], 70 y.o.   MRN: [id number]   Subjective:    Patient ID: Beth Tyler, female    DOB: [birth year], 70 y.o.   MRN: [id number]  HPI  Patient with past history of diabetes, hypertension, GERD, IBS and hypercholesterolemia.  She comes in today to follow up on these issues.  She is having low back pain and pain in her right leg.  Taking tylenol.  Present for 3-4 weeks.  Right knee hurts.  Still trying to keep moving.  Her husband has not been doing well.  Increased stress related to this.  Has not been able to follow a regular diet.  Forgot her sugars.  Increased a1c.  Trying to adjust her insulin.  Cost is an issue for her.  No chest pain or tightness.  No sob.  No acid reflux.     Past Medical History  Diagnosis Date  . Diabetes mellitus, type II (HCC)   . Hypertension   . Hypercholesterolemia   . GERD (gastroesophageal reflux disease)   . IBS (irritable bowel syndrome)   . Gastritis   . B12 deficiency   . Arthritis   . Diabetes mellitus (HCC) 02/17/2012  . Anemia   . Pernicious anemia    Past Surgical History  Procedure Laterality Date  . Breast surgery  1980    benign cyst removal  . Cholecystectomy  1983  . Breast biopsy Right 1980    neg   Family History  Problem Relation Age of Onset  . Diabetes Father   . Kidney disease Father   . Heart disease Father   . Alcohol abuse Mother   . Dementia Mother   . Diabetes Mother   . Hemophilia Brother   . Uterine cancer Sister   . Diabetes Sister   . COPD Sister   . Breast cancer Maternal Aunt   . Diabetes Other   . Heart disease Son 69    CABG  . Breast cancer Daughter 75   Social History   Social History  . Marital Status: Married    Spouse Name: N/A  . Number of Children: 4  . Years of Education: N/A   Social History Main Topics  . Smoking status: Never Smoker   . Smokeless tobacco: Never Used  . Alcohol Use: No  . Drug Use: No  . Sexual Activity: Not  Currently   Other Topics Concern  . None   Social History Narrative   Lives with her husband. Has four children. Manager of an apartment building. No tobacco, alcohol or other drug use    Outpatient Encounter Prescriptions as of 05/23/2015  Medication Sig  . amLODipine (NORVASC) 10 MG tablet TAKE ONE TABLET BY MOUTH ONCE DAILY  . aspirin 81 MG tablet Take 81 mg by mouth daily.  Marland Kitchen atenolol (TENORMIN) 100 MG tablet TAKE ONE TABLET BY MOUTH ONCE DAILY IN THE MORNING AND ONE-HALF TABLET IN THE EVENING (Patient taking differently: TAKE ONE TABLET BY MOUTH ONE-HALF  DAILY IN THE MORNING AND ONE TABLET IN THE EVENING)  . cyanocobalamin (,VITAMIN B-12,) 1000 MCG/ML injection Inject 1,000 mcg into the muscle every 30 (thirty) days.  . ferrous fumarate (HEMOCYTE - 106 MG FE) 325 (106 FE) MG TABS tablet Take 1 tablet by mouth.  . fluticasone (CUTIVATE) 0.05 % cream Use as directed.  Marland Kitchen glipiZIDE (GLUCOTROL XL) 5 MG 24 hr tablet TAKE ONE TABLET BY MOUTH WITH BREAKFAST  . hydrochlorothiazide (HYDRODIURIL) 25  MG tablet TAKE ONE TABLET BY MOUTH ONCE DAILY  . insulin glargine (LANTUS) 100 UNIT/ML injection 20 units q am and 4 units q hs (Patient taking differently: 22 units q am and 12 units q hs)  . Insulin Pen Needle 32G X 8 MM MISC Use as directed three times daily Dx E11.9  . lisinopril (PRINIVIL,ZESTRIL) 20 MG tablet Take two tablets in the morning and one in the evening. (Patient taking differently: Take one tablet in the morning and two in the evening.)  . magnesium oxide (MAG-OX) 400 MG tablet TAKE ONE TABLET BY MOUTH ONCE DAILY  . metFORMIN (GLUCOPHAGE) 1000 MG tablet TAKE ONE TABLET BY MOUTH TWICE DAILY WITH MEALS  . omeprazole (PRILOSEC) 20 MG capsule TAKE ONE CAPSULE BY MOUTH TWICE DAILY  . pioglitazone (ACTOS) 30 MG tablet TAKE ONE TABLET BY MOUTH ONCE DAILY  . simvastatin (ZOCOR) 10 MG tablet TAKE ONE TABLET BY MOUTH AT BEDTIME  . [DISCONTINUED] insulin lispro ([name]) 100 UNIT/ML injection  Inject 12 units before lunch and 12 units before dinner (Patient not taking: Reported on 04/11/2015)  . [EXPIRED] cyanocobalamin ((VITAMIN B-12)) injection 1,000 mcg    No facility-administered encounter medications on file as of 05/23/2015.    Review of Systems  Constitutional: Negative for appetite change and unexpected weight change.  HENT: Negative for congestion and sinus pressure.   Respiratory: Negative for cough, chest tightness and shortness of breath.   Cardiovascular: Negative for chest pain, palpitations and leg swelling.  Gastrointestinal: Negative for nausea, vomiting and abdominal pain.  Genitourinary: Negative for dysuria and difficulty urinating.  Musculoskeletal: Positive for back pain.       Right leg pain and right knee pain as outlined.    Skin: Negative for color change and rash.  Neurological: Negative for dizziness, light-headedness and headaches.  Psychiatric/Behavioral: Negative for dysphoric mood and agitation.       Objective:    Physical Exam  Constitutional: She appears well-developed and well-nourished. No distress.  HENT:  Nose: Nose normal.  Mouth/Throat: Oropharynx is clear and moist.  Eyes: Conjunctivae are normal. Right eye exhibits no discharge. Left eye exhibits no discharge.  Neck: Neck supple. No thyromegaly present.  Cardiovascular: Normal rate and regular rhythm.   Pulmonary/Chest: Breath sounds normal. No respiratory distress. She has no wheezes.  Abdominal: Soft. Bowel sounds are normal. There is no tenderness.  Musculoskeletal: She exhibits no tenderness.  Stable pedal and ankle edema.  No increased edema.   Lymphadenopathy:    She has no cervical adenopathy.  Skin: No rash noted. No erythema.  Psychiatric: She has a normal mood and affect. Her behavior is normal.    BP 130/60 mmHg  Pulse 64  Temp(Src) 98.1 F (36.7 C) (Oral)  Resp 18  Ht  (1.575 m)  Wt 235 lb 2 oz (106.652 kg)  BMI 42.99 kg/m2  SpO2 96% Wt Readings from  Last 3 Encounters:  05/23/15 235 lb 2 oz (106.652 kg)  04/11/15 233 lb 8 oz (105.915 kg)  04/06/15 233 lb 12.8 oz (106.051 kg)     Lab Results  Component Value Date   WBC 8.9 07/07/2014   HGB 12.6 07/07/2014   HCT 38.4 07/07/2014   PLT 217.0 07/07/2014   GLUCOSE 115* 04/11/2015   CHOL 126 04/11/2015   TRIG 107 04/11/2015   HDL 55 04/11/2015   LDLDIRECT 74.4 02/21/2012   LDLCALC 50 04/11/2015   ALT 19 04/11/2015   AST 16 04/11/2015   NA 143 04/11/2015  K 5.0 04/11/2015   CL 105 04/11/2015   CREATININE 0.85 04/11/2015   BUN 15 04/11/2015   CO2 23 04/11/2015   TSH CANCELED 04/11/2015   HGBA1C 9.7* 04/11/2015   MICROALBUR 4.1* 11/10/2014    Mm Digital Screening Bilateral  01/30/2015  CLINICAL DATA:  Screening. EXAM: DIGITAL SCREENING BILATERAL MAMMOGRAM WITH CAD COMPARISON:  Previous exam(s). ACR Breast Density Category b: There are scattered areas of fibroglandular density. FINDINGS: There are no findings suspicious for malignancy. Images were processed with CAD. IMPRESSION: No mammographic evidence of malignancy. A result letter of this screening mammogram will be mailed directly to the patient. RECOMMENDATION: Screening mammogram in one year. (Code:SM-B-01Y) BI-RADS CATEGORY  1: Negative. Electronically Signed   By: Rolla Plate M.D.   On: 01/30/2015 11:27       Assessment & Plan:   Problem List Items Addressed This Visit    B12 deficiency    Continue b12 injections.       Relevant Medications   cyanocobalamin ((VITAMIN B-12)) injection 1,000 mcg (Completed)   Back pain - Primary    Back and right leg pain as outlined.  Continue tylenol.  Check xray.        Relevant Orders   DG Lumbar Spine 2-3 Views (Completed)   Diabetes mellitus (HCC)    Still with elevated sugars.  Have discussed diet and exercise.  Have been trying to adjust her insulin.  Cost is an issue for her.  Sugars still poorly controlled.  Refer to endocrinology for evaluation and treatment.   Send in readings over the next few weeks.        Relevant Orders   Ambulatory referral to Endocrinology   Hypercholesterolemia    On simvastatin.  Low cholesterol diet and exercise.  Follow lipid panel and liver function tests.        Hypertension    Blood pressure under good control.  Continue same medication regimen.  Follow pressures.  Follow metabolic panel.        Morbid obesity (HCC)    Discussed diet and exercise.  Follow.        Stress    Increased stress with her family issues and recent deaths, etc.  She feels she is handling things relatively well.  Desires no further intervention at this time.  Follow.         Other Visit Diagnoses    Right hip pain        Relevant Orders    DG HIP UNILAT WITH PELVIS 2-3 VIEWS RIGHT (Completed)        Dale [address], MD

## 2015-05-23 NOTE — Progress Notes (Signed)
Pre-visit discussion using our clinic review tool. No additional management support is needed unless otherwise documented below in the visit note.  

## 2015-05-25 ENCOUNTER — Ambulatory Visit
Admission: RE | Admit: 2015-05-25 | Discharge: 2015-05-25 | Disposition: A | Discharge status: Home / Self Care | Payer: Medicare HMO | Source: Ambulatory Visit | Attending: Internal Medicine | Admitting: Internal Medicine

## 2015-05-25 DIAGNOSIS — M5441 Lumbago with sciatica, right side: Secondary | ICD-10-CM | POA: Insufficient documentation

## 2015-05-25 DIAGNOSIS — M8588 Other specified disorders of bone density and structure, other site: Secondary | ICD-10-CM | POA: Diagnosis not present

## 2015-05-25 DIAGNOSIS — M5136 Other intervertebral disc degeneration, lumbar region: Secondary | ICD-10-CM | POA: Diagnosis not present

## 2015-05-25 DIAGNOSIS — M1611 Unilateral primary osteoarthritis, right hip: Secondary | ICD-10-CM | POA: Diagnosis not present

## 2015-05-25 DIAGNOSIS — M1612 Unilateral primary osteoarthritis, left hip: Secondary | ICD-10-CM | POA: Diagnosis not present

## 2015-05-25 DIAGNOSIS — M25551 Pain in right hip: Secondary | ICD-10-CM | POA: Diagnosis not present

## 2015-05-26 ENCOUNTER — Encounter: Payer: Self-pay | Admitting: Internal Medicine

## 2015-06-01 ENCOUNTER — Ambulatory Visit
Admission: RE | Admit: 2015-06-01 | Discharge: 2015-06-01 | Disposition: A | Discharge status: Home / Self Care | Payer: Medicare HMO | Source: Ambulatory Visit | Attending: Internal Medicine | Admitting: Internal Medicine

## 2015-06-01 DIAGNOSIS — E2839 Other primary ovarian failure: Secondary | ICD-10-CM

## 2015-06-01 DIAGNOSIS — Z780 Asymptomatic menopausal state: Secondary | ICD-10-CM | POA: Diagnosis not present

## 2015-06-01 DIAGNOSIS — Z13820 Encounter for screening for osteoporosis: Secondary | ICD-10-CM | POA: Insufficient documentation

## 2015-06-02 ENCOUNTER — Encounter: Payer: Self-pay | Admitting: Internal Medicine

## 2015-06-04 ENCOUNTER — Encounter: Payer: Self-pay | Admitting: Internal Medicine

## 2015-06-04 NOTE — Assessment & Plan Note (Signed)
Still with elevated sugars.  Have discussed diet and exercise.  Have been trying to adjust her insulin.  Cost is an issue for her.  Sugars still poorly controlled.  Refer to endocrinology for evaluation and treatment.  Send in readings over the next few weeks.

## 2015-06-04 NOTE — Assessment & Plan Note (Signed)
Discussed diet and exercise.  Follow.  

## 2015-06-04 NOTE — Assessment & Plan Note (Signed)
Blood pressure under good control.  Continue same medication regimen.  Follow pressures.  Follow metabolic panel.   

## 2015-06-04 NOTE — Assessment & Plan Note (Signed)
Continue b12 injections.  

## 2015-06-04 NOTE — Assessment & Plan Note (Signed)
Back and right leg pain as outlined.  Continue tylenol.  Check xray.

## 2015-06-04 NOTE — Assessment & Plan Note (Signed)
On simvastatin.  Low cholesterol diet and exercise.  Follow lipid panel and liver function tests.   

## 2015-06-04 NOTE — Assessment & Plan Note (Signed)
Increased stress with her family issues and recent deaths, etc.  She feels she is handling things relatively well.  Desires no further intervention at this time.  Follow.

## 2015-06-07 NOTE — Telephone Encounter (Signed)
Unread mychart message mailed to patient 

## 2015-06-15 ENCOUNTER — Encounter: Payer: Self-pay | Admitting: *Deleted

## 2015-06-26 NOTE — Telephone Encounter (Signed)
Unread mychart message mailed to patient 

## 2015-07-11 ENCOUNTER — Other Ambulatory Visit: Payer: Self-pay | Admitting: Internal Medicine

## 2015-07-24 ENCOUNTER — Other Ambulatory Visit: Payer: Self-pay | Admitting: Internal Medicine

## 2015-08-07 ENCOUNTER — Other Ambulatory Visit: Payer: Self-pay | Admitting: Internal Medicine

## 2015-08-09 ENCOUNTER — Encounter: Payer: Self-pay | Admitting: Internal Medicine

## 2015-08-09 ENCOUNTER — Ambulatory Visit (INDEPENDENT_AMBULATORY_CARE_PROVIDER_SITE_OTHER): Payer: Medicare HMO | Admitting: Internal Medicine

## 2015-08-09 VITALS — BP 110/70 | HR 58 | Temp 98.5°F | Resp 18 | Ht 62.0 in | Wt 237.2 lb

## 2015-08-09 DIAGNOSIS — E7800 Pure hypercholesterolemia, unspecified: Secondary | ICD-10-CM

## 2015-08-09 DIAGNOSIS — K589 Irritable bowel syndrome without diarrhea: Secondary | ICD-10-CM

## 2015-08-09 DIAGNOSIS — E1140 Type 2 diabetes mellitus with diabetic neuropathy, unspecified: Secondary | ICD-10-CM

## 2015-08-09 DIAGNOSIS — R600 Localized edema: Secondary | ICD-10-CM

## 2015-08-09 DIAGNOSIS — E538 Deficiency of other specified B group vitamins: Secondary | ICD-10-CM

## 2015-08-09 DIAGNOSIS — Z658 Other specified problems related to psychosocial circumstances: Secondary | ICD-10-CM

## 2015-08-09 DIAGNOSIS — F439 Reaction to severe stress, unspecified: Secondary | ICD-10-CM

## 2015-08-09 DIAGNOSIS — I10 Essential (primary) hypertension: Secondary | ICD-10-CM

## 2015-08-09 DIAGNOSIS — Z794 Long term (current) use of insulin: Secondary | ICD-10-CM

## 2015-08-09 DIAGNOSIS — R194 Change in bowel habit: Secondary | ICD-10-CM

## 2015-08-09 MED ORDER — CYANOCOBALAMIN 1000 MCG/ML IJ SOLN
1000.0000 ug | Freq: Once | INTRAMUSCULAR | Status: AC
  Administered 2015-08-09: 1000 ug via INTRAMUSCULAR

## 2015-08-09 NOTE — Progress Notes (Signed)
Pre-visit discussion using our clinic review tool. No additional management support is needed unless otherwise documented below in the visit note.  

## 2015-08-09 NOTE — Patient Instructions (Signed)
Triamcinolone cream.

## 2015-08-09 NOTE — Progress Notes (Signed)
Patient ID: Beth Tyler, female   DOB: [birth year], 70 y.o.   MRN: [id number]   Subjective:    Patient ID: Beth Tyler, female    DOB: 10/15/1945, 70 y.o.   MRN: [id number]  HPI  Patient here for a scheduled follow up. Increased stress.  Husband recently passed away.  She is also still trying to cope with her son's death as well.  She has good support.  Does not feel needs anything more at this time.  Breathing stable.  No chest pain or tightness.  No nausea or vomiting.  Increased bowel issues.  Loose stool.  Increased.  Probiotics do not help.  Sugars still elevated in the evening.  AM sugars averaging 120-130s and pm sugars averaging 185-240.  She has not been eating regularly and not watching what she eats.  She hopes she can get in more of a routine now.  Had a tick bite three weeks ago.  No fever.  No aching.  No headache.     Past Medical History  Diagnosis Date  . Diabetes mellitus, type II ([place])   . Hypertension   . Hypercholesterolemia   . GERD (gastroesophageal reflux disease)   . IBS (irritable bowel syndrome)   . Gastritis   . B12 deficiency   . Arthritis   . Diabetes mellitus ([place]) 02/17/2012  . Anemia   . Pernicious anemia    Past Surgical History  Procedure Laterality Date  . Breast surgery  1980    benign cyst removal  . Cholecystectomy  1983  . Breast biopsy Right 1980    neg   Family History  Problem Relation Age of Onset  . Diabetes Father   . Kidney disease Father   . Heart disease Father   . Alcohol abuse Mother   . Dementia Mother   . Diabetes Mother   . Hemophilia Brother   . Uterine cancer Sister   . Diabetes Sister   . COPD Sister   . Breast cancer Maternal Aunt   . Diabetes Other   . Heart disease Son 37    CABG  . Breast cancer Daughter 59   Social History   Social History  . Marital Status: Married    Spouse Name: N/A  . Number of Children: 4  . Years of Education: N/A   Social History Main Topics  . Smoking status: Never  Smoker   . Smokeless tobacco: Never Used  . Alcohol Use: No  . Drug Use: No  . Sexual Activity: Not Currently   Other Topics Concern  . None   Social History Narrative   Lives with her husband. Has four children. Manager of an apartment building. No tobacco, alcohol or other drug use    Outpatient Encounter Prescriptions as of 08/09/2015  Medication Sig  . amLODipine (NORVASC) 10 MG tablet TAKE ONE TABLET BY MOUTH ONCE DAILY  . aspirin 81 MG tablet Take 81 mg by mouth daily.  Marland Kitchen atenolol (TENORMIN) 100 MG tablet TAKE ONE TABLET BY MOUTH ONCE DAILY IN THE MORNING AND ONE-HALF TABLET IN THE EVENING (Patient taking differently: TAKE ONE TABLET BY MOUTH ONE-HALF  DAILY IN THE MORNING AND ONE TABLET IN THE EVENING)  . cyanocobalamin (,VITAMIN B-12,) 1000 MCG/ML injection Inject 1,000 mcg into the muscle every 30 (thirty) days.  . ferrous fumarate (HEMOCYTE - 106 MG FE) 325 (106 FE) MG TABS tablet Take 1 tablet by mouth.  . fluticasone (CUTIVATE) 0.05 % cream Use as directed.  Marland Kitchen  glipiZIDE (GLUCOTROL XL) 5 MG 24 hr tablet TAKE ONE TABLET BY MOUTH WITH BREAKFAST  . hydrochlorothiazide (HYDRODIURIL) 25 MG tablet TAKE ONE TABLET BY MOUTH ONCE DAILY  . insulin glargine (LANTUS) 100 UNIT/ML injection 20 units q am and 4 units q hs (Patient taking differently: 22 units q am and 12 units q hs)  . Insulin Pen Needle 32G X 8 MM MISC Use as directed three times daily Dx E11.9  . lisinopril (PRINIVIL,ZESTRIL) 20 MG tablet TAKE TWO TABLETS BY MOUTH IN THE MORNING AND ONE IN THE EVENING (Patient taking differently: TAKE ONE IN THE MORNING AND TWO AT BEDTIME)  . magnesium oxide (MAG-OX) 400 MG tablet TAKE ONE TABLET BY MOUTH ONCE DAILY  . metFORMIN (GLUCOPHAGE) 1000 MG tablet TAKE ONE TABLET BY MOUTH TWICE DAILY WITH MEALS  . omeprazole (PRILOSEC) 20 MG capsule TAKE ONE CAPSULE BY MOUTH TWICE DAILY  . pioglitazone (ACTOS) 30 MG tablet TAKE ONE TABLET BY MOUTH ONCE DAILY  . simvastatin (ZOCOR) 10 MG tablet TAKE  ONE TABLET BY MOUTH AT BEDTIME  . [EXPIRED] cyanocobalamin ((VITAMIN B-12)) injection 1,000 mcg    No facility-administered encounter medications on file as of 08/09/2015.    Review of Systems  Constitutional: Negative for appetite change and unexpected weight change.  HENT: Negative for congestion and sinus pressure.   Respiratory: Negative for cough, chest tightness and shortness of breath.   Cardiovascular: Negative for chest pain, palpitations and leg swelling.  Gastrointestinal: Positive for diarrhea. Negative for nausea and vomiting.  Genitourinary: Negative for dysuria and difficulty urinating.  Musculoskeletal: Negative for myalgias.       Bilateral leg pain as outlined.   Skin:       Stasis changed present - lower extremities.   Neurological: Negative for dizziness, light-headedness and headaches.  Psychiatric/Behavioral: Negative for agitation.       Increased stress as outlined.        Objective:    Physical Exam  Constitutional: She appears well-developed and well-nourished. No distress.  HENT:  Nose: Nose normal.  Mouth/Throat: Oropharynx is clear and moist.  Neck: Neck supple. No thyromegaly present.  Cardiovascular: Normal rate and regular rhythm.   Pulmonary/Chest: Breath sounds normal. No respiratory distress. She has no wheezes.  Abdominal: Soft. Bowel sounds are normal. There is no tenderness.  Musculoskeletal:  Pedal and lower extremity edema - stable.  Stasis changes present.    Lymphadenopathy:    She has no cervical adenopathy.  Skin: There is erythema.  Psychiatric: She has a normal mood and affect. Her behavior is normal.    BP 110/70 mmHg  Pulse 58  Temp(Src) 98.5 F (36.9 C) (Oral)  Resp 18  Ht 5' 2"  (1.575 m)  Wt 237 lb 4 oz (107.616 kg)  BMI 43.38 kg/m2  SpO2 96% Wt Readings from Last 3 Encounters:  08/09/15 237 lb 4 oz (107.616 kg)  05/23/15 235 lb 2 oz (106.652 kg)  04/11/15 233 lb 8 oz (105.915 kg)     Lab Results  Component  Value Date   WBC 8.9 07/07/2014   HGB 12.6 07/07/2014   HCT 38.4 07/07/2014   PLT 217.0 07/07/2014   GLUCOSE 115* 04/11/2015   CHOL 126 04/11/2015   TRIG 107 04/11/2015   HDL 55 04/11/2015   LDLDIRECT 74.4 02/21/2012   LDLCALC 50 04/11/2015   ALT 19 04/11/2015   AST 16 04/11/2015   NA 143 04/11/2015   K 5.0 04/11/2015   CL 105 04/11/2015   CREATININE 0.85  04/11/2015   BUN 15 04/11/2015   CO2 23 04/11/2015   TSH CANCELED 04/11/2015   HGBA1C 9.7* 04/11/2015   MICROALBUR 4.1* 11/10/2014    Dg Bone Density  06/01/2015  EXAM: DUAL X-RAY ABSORPTIOMETRY (DXA) FOR BONE MINERAL DENSITY IMPRESSION: Dear Dr. Nicki Reaper, Your patient Beth Tyler completed a BMD test on 06/01/2015 using the [employer]axon (analysis version: 14.10) manufactured by [employer]. The following summarizes the results of our evaluation. PATIENT BIOGRAPHICAL: Name: Beth Tyler, Beth Tyler Patient ID: [id number] Birth Date: 02-06-1946 Height: 61.5 in. Gender: Female Exam Date: 06/01/2015 Weight: 238.6 lbs. Indications: Advanced Age, Caucasian, Diabetic, Height Loss, History of Osteoporosis, Postmenopausal Fractures: Treatments: ASPRIN 81 MG, insulin, Metformin, omeprazole, simvastatin ASSESSMENT: The BMD measured at Femur Neck Left is 1.063 g/cm2 with a T-score of 0.2. This patient is considered normal according to [facility]) criteria. Site Region Measured Measured WHO Young Adult BMD Date       Age      Classification T-score AP Spine L1-L4 06/01/2015 70.6 Normal 0.9 1.300 g/cm2 DualFemur Neck Left 06/01/2015 70.6 Normal 0.2 1.063 g/cm2 World Health Organization [facility]) criteria for post-menopausal, Caucasian Women: Normal:       T-score at or above -1 SD Osteopenia:   T-score between -1 and -2.5 SD Osteoporosis: T-score at or below -2.5 SD RECOMMENDATIONS: [facility] recommends that FDA-approved medical therapies be considered in postmenopausal women and men age 58 or older with a: 1.  Hip or vertebral (clinical or morphometric) fracture. 2. T-score of < -2.5 at the spine or hip. 3. Ten-year fracture probability by FRAX of 3% or greater for hip fracture or 20% or greater for major osteoporotic fracture. All treatment decisions require clinical judgment and consideration of individual patient factors, including patient preferences, co-morbidities, previous drug use, risk factors not captured in the FRAX model (e.g. falls, vitamin D deficiency, increased bone turnover, interval significant decline in bone density) and possible under - or over-estimation of fracture risk by FRAX. All patients should ensure an adequate intake of dietary calcium (1200 mg/d) and vitamin D (800 IU daily) unless contraindicated. FOLLOW-UP: People with diagnosed cases of osteoporosis or at high risk for fracture should have regular bone mineral density tests. For patients eligible for Medicare, routine testing is allowed once every 2 years. The testing frequency can be increased to one year for patients who have rapidly progressing disease, those who are receiving or discontinuing medical therapy to restore bone mass, or have additional risk factors. I have reviewed this report, and agree with the above findings. [facility] Radiology Electronically Signed   By: Lajean Manes M.D.   On: 06/01/2015 14:18       Assessment & Plan:   Problem List Items Addressed This Visit    B12 deficiency    Continue B12 injections.       Relevant Medications   cyanocobalamin ((VITAMIN B-12)) injection 1,000 mcg (Completed)   Bilateral leg edema    Stasis changes present.  TCC cream as directed.  Compression hose.       Diabetes mellitus ([place]) - Primary    Sugars as outlined.  Discussed diet adjustment.  Plans to try to get in a routine with eating, etc.  Will check sugars and record.  Send in readings over the next few weeks.  Adjust medication as needed.  Follow met b and a1c.       Relevant Orders   Hemoglobin A1c    Hypercholesterolemia    On simvastatin.  Low cholesterol diet and exercise.  Follow lipid panel and liver function tests.        Relevant Orders   Lipid panel   Hepatic function panel   Hypertension    Blood pressure under good control.  Continue same medication regimen.  Follow pressures.  Follow metabolic panel.        Relevant Orders   CBC with Differential/Platelet   Basic metabolic panel   TSH   IBS (irritable bowel syndrome)    Increased loose stool and problems with her bowels.  Discussed with her today.  Probiotics not helping.  Refer to GI.        Relevant Orders   Ambulatory referral to Gastroenterology   Morbid obesity ([place])    Discussed diet and exercise.  Follow.        Stress    Increased stress as outlined.  Discussed with her today.  She does not feel needs anything more at this time.  Follow.        Other Visit Diagnoses    Bowel habit changes        Relevant Orders    Ambulatory referral to Gastroenterology        Einar Pheasant, MD

## 2015-08-10 ENCOUNTER — Encounter: Payer: Self-pay | Admitting: Internal Medicine

## 2015-08-10 NOTE — Assessment & Plan Note (Signed)
Stasis changes present.  TCC cream as directed.  Compression hose.

## 2015-08-10 NOTE — Assessment & Plan Note (Signed)
On simvastatin.  Low cholesterol diet and exercise.  Follow lipid panel and liver function tests.   

## 2015-08-10 NOTE — Assessment & Plan Note (Signed)
Sugars as outlined.  Discussed diet adjustment.  Plans to try to get in a routine with eating, etc.  Will check sugars and record.  Send in readings over the next few weeks.  Adjust medication as needed.  Follow met b and a1c.

## 2015-08-10 NOTE — Assessment & Plan Note (Signed)
Increased stress as outlined.  Discussed with her today.  She does not feel needs anything more at this time.  Follow.   

## 2015-08-10 NOTE — Assessment & Plan Note (Signed)
Discussed diet and exercise.  Follow.  

## 2015-08-10 NOTE — Assessment & Plan Note (Signed)
Increased loose stool and problems with her bowels.  Discussed with her today.  Probiotics not helping.  Refer to GI.

## 2015-08-10 NOTE — Assessment & Plan Note (Signed)
Continue B12 injections.   

## 2015-08-10 NOTE — Assessment & Plan Note (Signed)
Blood pressure under good control.  Continue same medication regimen.  Follow pressures.  Follow metabolic panel.   

## 2015-08-31 ENCOUNTER — Encounter: Payer: Self-pay | Admitting: Internal Medicine

## 2015-09-01 NOTE — Telephone Encounter (Signed)
Let her know that I am not in the office.  She will need to be seen.  Dr Lorin PicketScott

## 2015-09-13 ENCOUNTER — Ambulatory Visit: Payer: Self-pay

## 2015-09-19 ENCOUNTER — Other Ambulatory Visit (INDEPENDENT_AMBULATORY_CARE_PROVIDER_SITE_OTHER): Payer: Medicare HMO

## 2015-09-19 ENCOUNTER — Ambulatory Visit (INDEPENDENT_AMBULATORY_CARE_PROVIDER_SITE_OTHER): Payer: Medicare HMO

## 2015-09-19 DIAGNOSIS — E1140 Type 2 diabetes mellitus with diabetic neuropathy, unspecified: Secondary | ICD-10-CM

## 2015-09-19 DIAGNOSIS — I10 Essential (primary) hypertension: Secondary | ICD-10-CM

## 2015-09-19 DIAGNOSIS — Z794 Long term (current) use of insulin: Secondary | ICD-10-CM

## 2015-09-19 DIAGNOSIS — E7800 Pure hypercholesterolemia, unspecified: Secondary | ICD-10-CM

## 2015-09-19 DIAGNOSIS — E538 Deficiency of other specified B group vitamins: Secondary | ICD-10-CM | POA: Diagnosis not present

## 2015-09-19 LAB — CBC WITH DIFFERENTIAL/PLATELET
BASOS ABS: 0 10*3/uL (ref 0.0–0.1)
Basophils Relative: 0.3 % (ref 0.0–3.0)
Eosinophils Absolute: 0.4 10*3/uL (ref 0.0–0.7)
Eosinophils Relative: 4.4 % (ref 0.0–5.0)
HEMATOCRIT: 38.9 % (ref 36.0–46.0)
HEMOGLOBIN: 12.7 g/dL (ref 12.0–15.0)
LYMPHS PCT: 29.1 % (ref 12.0–46.0)
Lymphs Abs: 2.6 10*3/uL (ref 0.7–4.0)
MCHC: 32.5 g/dL (ref 30.0–36.0)
MCV: 88.1 fl (ref 78.0–100.0)
MONOS PCT: 8.7 % (ref 3.0–12.0)
Monocytes Absolute: 0.8 10*3/uL (ref 0.1–1.0)
Neutro Abs: 5.2 10*3/uL (ref 1.4–7.7)
Neutrophils Relative %: 57.5 % (ref 43.0–77.0)
Platelets: 239 10*3/uL (ref 150.0–400.0)
RBC: 4.42 Mil/uL (ref 3.87–5.11)
RDW: 13.9 % (ref 11.5–15.5)
WBC: 9.1 10*3/uL (ref 4.0–10.5)

## 2015-09-19 LAB — TSH: TSH: 2.21 u[IU]/mL (ref 0.35–4.50)

## 2015-09-19 LAB — LIPID PANEL
CHOL/HDL RATIO: 2
Cholesterol: 129 mg/dL (ref 0–200)
HDL: 55.4 mg/dL (ref >39.00)
LDL Cholesterol: 56 mg/dL (ref 0–99)
NONHDL: 73.41
Triglycerides: 89 mg/dL (ref 0.0–149.0)
VLDL: 17.8 mg/dL (ref 0.0–40.0)

## 2015-09-19 LAB — BASIC METABOLIC PANEL
BUN: 17 mg/dL (ref 6–23)
CHLORIDE: 105 meq/L (ref 96–112)
CO2: 29 mEq/L (ref 19–32)
CREATININE: 0.81 mg/dL (ref 0.40–1.20)
Calcium: 9.6 mg/dL (ref 8.4–10.5)
GFR: 74.09 mL/min (ref >60.00)
Glucose, Bld: 105 mg/dL — ABNORMAL HIGH (ref 70–99)
Potassium: 5.5 mEq/L — ABNORMAL HIGH (ref 3.5–5.1)
Sodium: 142 mEq/L (ref 135–145)

## 2015-09-19 LAB — HEPATIC FUNCTION PANEL
ALBUMIN: 4.4 g/dL (ref 3.5–5.2)
ALT: 18 U/L (ref 0–35)
AST: 16 U/L (ref 0–37)
Alkaline Phosphatase: 65 U/L (ref 39–117)
Bilirubin, Direct: 0.1 mg/dL (ref 0.0–0.3)
Total Bilirubin: 0.5 mg/dL (ref 0.2–1.2)
Total Protein: 6.9 g/dL (ref 6.0–8.3)

## 2015-09-19 LAB — HEMOGLOBIN A1C: HEMOGLOBIN A1C: 7.9 % — AB (ref 4.6–6.5)

## 2015-09-19 MED ORDER — CYANOCOBALAMIN 1000 MCG/ML IJ SOLN
1000.0000 ug | Freq: Once | INTRAMUSCULAR | Status: AC
  Administered 2015-09-19: 1000 ug via INTRAMUSCULAR

## 2015-09-19 NOTE — Progress Notes (Signed)
Pt came for B12 injection. Given in left deltoid by Felix Ahmadiallie Fransen. Pt tolerated well.

## 2015-09-20 ENCOUNTER — Other Ambulatory Visit
Admission: RE | Admit: 2015-09-20 | Discharge: 2015-09-20 | Disposition: A | Discharge status: Home / Self Care | Payer: Medicare HMO | Source: Ambulatory Visit | Attending: Internal Medicine | Admitting: Internal Medicine

## 2015-09-20 ENCOUNTER — Encounter: Payer: Self-pay | Admitting: *Deleted

## 2015-09-20 ENCOUNTER — Other Ambulatory Visit: Payer: Self-pay | Admitting: Internal Medicine

## 2015-09-20 ENCOUNTER — Other Ambulatory Visit: Payer: Self-pay

## 2015-09-20 DIAGNOSIS — E875 Hyperkalemia: Secondary | ICD-10-CM | POA: Insufficient documentation

## 2015-09-20 LAB — POTASSIUM: POTASSIUM: 5 mmol/L (ref 3.5–5.1)

## 2015-09-20 NOTE — Progress Notes (Signed)
Order placed for f/u potassium.  

## 2015-10-08 ENCOUNTER — Other Ambulatory Visit: Payer: Self-pay | Admitting: Internal Medicine

## 2015-10-09 ENCOUNTER — Encounter: Payer: Self-pay | Admitting: Internal Medicine

## 2015-10-11 ENCOUNTER — Other Ambulatory Visit: Payer: Self-pay | Admitting: Internal Medicine

## 2015-10-11 ENCOUNTER — Other Ambulatory Visit: Payer: Self-pay | Admitting: *Deleted

## 2015-10-11 MED ORDER — INSULIN GLARGINE 100 UNIT/ML ~~LOC~~ SOLN: SUBCUTANEOUS | Status: DC

## 2015-10-12 ENCOUNTER — Encounter: Payer: Self-pay | Admitting: *Deleted

## 2015-10-12 ENCOUNTER — Encounter: Payer: Self-pay | Admitting: Internal Medicine

## 2015-10-12 ENCOUNTER — Other Ambulatory Visit: Payer: Self-pay | Admitting: *Deleted

## 2015-10-12 ENCOUNTER — Telehealth: Payer: Self-pay | Admitting: *Deleted

## 2015-10-12 MED ORDER — INSULIN DETEMIR 100 UNIT/ML FLEXPEN: PEN_INJECTOR | SUBCUTANEOUS | Status: DC

## 2015-10-12 NOTE — Telephone Encounter (Signed)
Ok to change to levemir.  Please confirm with pt how she is taking and then ok to call in change - same directions.  Given changing, have her monitor her sugars and make sure no change with control

## 2015-10-12 NOTE — Telephone Encounter (Signed)
Sent my chart message also

## 2015-10-12 NOTE — Telephone Encounter (Signed)
Pt aware of change & I sent in Levemir flex touch pen with same directions as Lantus (22 units in the morning & 12 units at bedtime).

## 2015-10-12 NOTE — Telephone Encounter (Signed)
Please advise change from Lantus to Levemir pen, thanks

## 2015-10-12 NOTE — Telephone Encounter (Signed)
LMTCB

## 2015-10-12 NOTE — Telephone Encounter (Signed)
[facility] [place]  has requested if her lantus be changed to levemir flex touch pen, due to insurance issues.

## 2015-10-13 ENCOUNTER — Encounter: Payer: Self-pay | Admitting: Internal Medicine

## 2015-10-13 NOTE — Telephone Encounter (Signed)
Please inform pt of Good Rx and see if this would help her get her insulin coverage.  Let me know if problems.

## 2015-11-14 ENCOUNTER — Ambulatory Visit (INDEPENDENT_AMBULATORY_CARE_PROVIDER_SITE_OTHER): Payer: Medicare HMO | Admitting: Internal Medicine

## 2015-11-14 ENCOUNTER — Encounter: Payer: Self-pay | Admitting: Internal Medicine

## 2015-11-14 VITALS — BP 136/80 | HR 68 | Temp 98.2°F | Resp 18 | Wt 241.0 lb

## 2015-11-14 DIAGNOSIS — R600 Localized edema: Secondary | ICD-10-CM

## 2015-11-14 DIAGNOSIS — E1140 Type 2 diabetes mellitus with diabetic neuropathy, unspecified: Secondary | ICD-10-CM | POA: Diagnosis not present

## 2015-11-14 DIAGNOSIS — E7800 Pure hypercholesterolemia, unspecified: Secondary | ICD-10-CM | POA: Diagnosis not present

## 2015-11-14 DIAGNOSIS — Z794 Long term (current) use of insulin: Secondary | ICD-10-CM

## 2015-11-14 DIAGNOSIS — M6530 Trigger finger, unspecified finger: Secondary | ICD-10-CM

## 2015-11-14 DIAGNOSIS — I10 Essential (primary) hypertension: Secondary | ICD-10-CM

## 2015-11-14 DIAGNOSIS — F439 Reaction to severe stress, unspecified: Secondary | ICD-10-CM

## 2015-11-14 DIAGNOSIS — E538 Deficiency of other specified B group vitamins: Secondary | ICD-10-CM

## 2015-11-14 DIAGNOSIS — Z658 Other specified problems related to psychosocial circumstances: Secondary | ICD-10-CM

## 2015-11-14 DIAGNOSIS — K589 Irritable bowel syndrome without diarrhea: Secondary | ICD-10-CM

## 2015-11-14 MED ORDER — CYANOCOBALAMIN 1000 MCG/ML IJ SOLN
1000.0000 ug | Freq: Once | INTRAMUSCULAR | Status: AC
  Administered 2015-11-14: 1000 ug via INTRAMUSCULAR

## 2015-11-14 NOTE — Progress Notes (Signed)
Patient ID: Beth Tyler, female   DOB: [birth year], 70 y.o.   MRN: [id number]   Subjective:    Patient ID: Beth Tyler, female    DOB: December 08, 1945, 70 y.o.   MRN: [id number]  HPI  Patient here for a scheduled follow up.  Still coping with the increased stress of her husband (and son) recent deaths.  She feels she is doing better now.  Has good support.  Does not feel needs anything more at this time. No chest pain.  No sob.  Still with bowel issues.  States stable.  She is having some persistent increased swelling in her feet and lower extremities.  Unable to wear compression hose.  Cannot get off.  Sugars are doing better.  Brought in no specific sugar readings.  States in am now averaging 120.  Evening sugars doing better.     Past Medical History:  Diagnosis Date  . Anemia   . Arthritis   . B12 deficiency   . Diabetes mellitus ([place]) 02/17/2012  . Diabetes mellitus, type II ([employer])   . Gastritis   . GERD (gastroesophageal reflux disease)   . Hypercholesterolemia   . Hypertension   . IBS (irritable bowel syndrome)   . Pernicious anemia    Past Surgical History:  Procedure Laterality Date  . BREAST BIOPSY Right 1980   neg  . BREAST SURGERY  1980   benign cyst removal  . CHOLECYSTECTOMY  1983   Family History  Problem Relation Age of Onset  . Diabetes Father   . Kidney disease Father   . Heart disease Father   . Alcohol abuse Mother   . Dementia Mother   . Diabetes Mother   . Diabetes Other   . Heart disease Son 74    CABG  . Hemophilia Brother   . Uterine cancer Sister   . Diabetes Sister   . COPD Sister   . Breast cancer Maternal Aunt   . Breast cancer Daughter 68   Social History   Social History  . Marital status: Married    Spouse name: N/A  . Number of children: 4  . Years of education: N/A   Social History Main Topics  . Smoking status: Never Smoker  . Smokeless tobacco: Never Used  . Alcohol use No  . Drug use: No  . Sexual activity: Not  Currently   Other Topics Concern  . None   Social History Narrative   Lives with her husband. Has four children. Manager of an apartment building. No tobacco, alcohol or other drug use    Outpatient Encounter Prescriptions as of 11/14/2015  Medication Sig  . amLODipine (NORVASC) 10 MG tablet TAKE ONE TABLET BY MOUTH ONCE DAILY  . aspirin 81 MG tablet Take 81 mg by mouth daily.  Marland Kitchen atenolol (TENORMIN) 100 MG tablet TAKE ONE TABLET BY MOUTH ONCE DAILY IN THE MORNING AND ONE-HALF TABLET IN THE EVENING (Patient taking differently: TAKE ONE TABLET BY MOUTH ONE-HALF  DAILY IN THE MORNING AND ONE TABLET IN THE EVENING)  . cyanocobalamin (,VITAMIN B-12,) 1000 MCG/ML injection Inject 1,000 mcg into the muscle every 30 (thirty) days.  . ferrous fumarate (HEMOCYTE - 106 MG FE) 325 (106 FE) MG TABS tablet Take 1 tablet by mouth.  . fluticasone (CUTIVATE) 0.05 % cream Use as directed.  Marland Kitchen glipiZIDE (GLUCOTROL XL) 5 MG 24 hr tablet TAKE ONE TABLET BY MOUTH WITH BREAKFAST  . hydrochlorothiazide (HYDRODIURIL) 25 MG tablet TAKE ONE TABLET BY MOUTH ONCE  DAILY  . Insulin Detemir (LEVEMIR) 100 UNIT/ML Pen Inject 22 units in the morning & 12 units at bedtime  . Insulin Pen Needle 32G X 8 MM MISC Use as directed three times daily Dx E11.9  . lisinopril (PRINIVIL,ZESTRIL) 20 MG tablet TAKE TWO TABLETS BY MOUTH IN THE MORNING AND ONE IN THE EVENING (Patient taking differently: TAKE ONE IN THE MORNING AND TWO AT BEDTIME)  . magnesium oxide (MAG-OX) 400 MG tablet TAKE ONE TABLET BY MOUTH ONCE DAILY  . metFORMIN (GLUCOPHAGE) 1000 MG tablet TAKE ONE TABLET BY MOUTH TWICE DAILY WITH MEALS  . omeprazole (PRILOSEC) 20 MG capsule TAKE ONE CAPSULE BY MOUTH TWICE DAILY  . pioglitazone (ACTOS) 30 MG tablet TAKE ONE TABLET BY MOUTH ONCE DAILY  . simvastatin (ZOCOR) 10 MG tablet TAKE ONE TABLET BY MOUTH AT BEDTIME  . [EXPIRED] cyanocobalamin ((VITAMIN B-12)) injection 1,000 mcg    No facility-administered encounter medications  on file as of 11/14/2015.     Review of Systems  Constitutional: Negative for appetite change and unexpected weight change.  HENT: Negative for congestion and sinus pressure.   Respiratory: Negative for cough, chest tightness and shortness of breath.   Cardiovascular: Positive for leg swelling. Negative for chest pain and palpitations.  Gastrointestinal: Negative for abdominal pain, nausea and vomiting.       Bowels stable.    Genitourinary: Negative for difficulty urinating and dysuria.  Musculoskeletal: Negative for myalgias.  Skin: Positive for rash.       Venostasis changes.    Neurological: Negative for dizziness, light-headedness and headaches.  Psychiatric/Behavioral:       Increased stress as outlined.  Discussed with her today.        Objective:     Blood pressure rechecked by me:  136/80  Physical Exam  Constitutional: She appears well-developed and well-nourished. No distress.  HENT:  Nose: Nose normal.  Mouth/Throat: Oropharynx is clear and moist.  Neck: Neck supple. No thyromegaly present.  Cardiovascular: Normal rate and regular rhythm.   Pulmonary/Chest: Breath sounds normal. No respiratory distress. She has no wheezes.  Abdominal: Soft. Bowel sounds are normal.  No change in abdominal exam - tenderness.    Musculoskeletal: She exhibits edema. She exhibits no tenderness.  Lymphadenopathy:    She has no cervical adenopathy.  Skin: Skin is warm. There is erythema.  Psychiatric: She has a normal mood and affect. Her behavior is normal.    BP 136/80   Pulse 68   Temp 98.2 F (36.8 C)   Resp 18   Wt 241 lb (109.3 kg)   BMI 44.08 kg/m  Wt Readings from Last 3 Encounters:  11/14/15 241 lb (109.3 kg)  08/09/15 237 lb 4 oz (107.6 kg)  05/23/15 235 lb 2 oz (106.7 kg)     Lab Results  Component Value Date   WBC 9.1 09/19/2015   HGB 12.7 09/19/2015   HCT 38.9 09/19/2015   PLT 239.0 09/19/2015   GLUCOSE 105 (H) 09/19/2015   CHOL 129 09/19/2015   TRIG  89.0 09/19/2015   HDL 55.40 09/19/2015   LDLDIRECT 74.4 02/21/2012   LDLCALC 56 09/19/2015   ALT 18 09/19/2015   AST 16 09/19/2015   NA 142 09/19/2015   K 5.0 09/20/2015   CL 105 09/19/2015   CREATININE 0.81 09/19/2015   BUN 17 09/19/2015   CO2 29 09/19/2015   TSH 2.21 09/19/2015   HGBA1C 7.9 (H) 09/19/2015   MICROALBUR 4.1 (H) 11/10/2014  Assessment & Plan:   Problem List Items Addressed This Visit    B12 deficiency    Continue B12 injections.       Bilateral leg edema    Worsening.  Venostasis rash.  TCC as directed.  Unable to get compression hose off.  Discussed leg elevation.  After discussion, refer to vascular surgery for further evaluation and treatment.        Relevant Orders   Ambulatory referral to Vascular Surgery   Diabetes mellitus ([place]) - Primary    Low carb diet and exercise.  States sugars are better.  Continue same medication regimen. She is back on her insulin.  Follow met b and a1c.       Relevant Orders   Hemoglobin T9O   Basic metabolic panel   Microalbumin / creatinine urine ratio   Hypercholesterolemia    On simvastatin.  Low cholesterol diet and exercise.  Follow lipid panel and liver function tests.        Relevant Orders   Lipid panel   Hepatic function panel   Hypertension    ood pressure as outlined.  Continue same medication regimen.  Follow pressures.  Follow metabolic panel.       IBS (irritable bowel syndrome)    Bowels overall stable.  Desires no further intervention.        Morbid obesity ([place])    Diet and exercise.        Stress    Increased stress as outlined.  She does not feel needs anything more at this time.  Follow.        Other Visit Diagnoses    Vitamin B 12 deficiency       Relevant Medications   cyanocobalamin ((VITAMIN B-12)) injection 1,000 mcg (Completed)   Trigger finger       desires no further intervention.         Einar Pheasant, MD

## 2015-11-14 NOTE — Progress Notes (Signed)
Pre visit review using our clinic review tool, if applicable. No additional management support is needed unless otherwise documented below in the visit note. 

## 2015-11-15 ENCOUNTER — Encounter: Payer: Self-pay | Admitting: Internal Medicine

## 2015-11-15 MED ORDER — TRIAMCINOLONE ACETONIDE 0.1 % EX CREA: 1.0000 "application " | TOPICAL_CREAM | Freq: Two times a day (BID) | CUTANEOUS | 0 refills | Status: DC

## 2015-11-15 NOTE — Assessment & Plan Note (Signed)
On simvastatin.  Low cholesterol diet and exercise.  Follow lipid panel and liver function tests.   

## 2015-11-15 NOTE — Assessment & Plan Note (Signed)
Worsening.  Venostasis rash.  TCC as directed.  Unable to get compression hose off.  Discussed leg elevation.  After discussion, refer to vascular surgery for further evaluation and treatment.

## 2015-11-15 NOTE — Assessment & Plan Note (Signed)
Bowels overall stable.  Desires no further intervention.

## 2015-11-15 NOTE — Assessment & Plan Note (Signed)
Low carb diet and exercise.  States sugars are better.  Continue same medication regimen. She is back on her insulin.  Follow met b and a1c.

## 2015-11-15 NOTE — Assessment & Plan Note (Signed)
Diet and exercise.   

## 2015-11-15 NOTE — Assessment & Plan Note (Signed)
Continue B12 injections.   

## 2015-11-15 NOTE — Addendum Note (Signed)
Addended by: Charm BargesSCOTT, Yazhini Mcaulay S on: 11/15/2015 06:46 AM   Modules accepted: Orders

## 2015-11-15 NOTE — Assessment & Plan Note (Signed)
Increased stress as outlined.  She does not feel needs anything more at this time.  Follow.

## 2015-11-15 NOTE — Assessment & Plan Note (Signed)
ood pressure as outlined.  Continue same medication regimen.  Follow pressures.  Follow metabolic panel.

## 2015-11-28 ENCOUNTER — Other Ambulatory Visit: Payer: Self-pay | Admitting: Internal Medicine

## 2015-12-12 DIAGNOSIS — R69 Illness, unspecified: Secondary | ICD-10-CM | POA: Diagnosis not present

## 2015-12-13 ENCOUNTER — Other Ambulatory Visit: Payer: Self-pay | Admitting: Internal Medicine

## 2015-12-19 ENCOUNTER — Ambulatory Visit (INDEPENDENT_AMBULATORY_CARE_PROVIDER_SITE_OTHER): Payer: Medicare HMO

## 2015-12-19 DIAGNOSIS — E538 Deficiency of other specified B group vitamins: Secondary | ICD-10-CM | POA: Diagnosis not present

## 2015-12-19 DIAGNOSIS — Z23 Encounter for immunization: Secondary | ICD-10-CM | POA: Diagnosis not present

## 2015-12-19 MED ORDER — CYANOCOBALAMIN 1000 MCG/ML IJ SOLN
1000.0000 ug | Freq: Once | INTRAMUSCULAR | Status: AC
  Administered 2015-12-19: 1000 ug via INTRAMUSCULAR

## 2015-12-19 NOTE — Progress Notes (Addendum)
Pt was in today receiving a B12 injection in the right deltoid. Pt tolerated well. Pt also got a flu which was tolerated.  Reviewed above.   Dr Lorin PicketScott

## 2015-12-26 DIAGNOSIS — H5213 Myopia, bilateral: Secondary | ICD-10-CM | POA: Diagnosis not present

## 2015-12-26 DIAGNOSIS — E11319 Type 2 diabetes mellitus with unspecified diabetic retinopathy without macular edema: Secondary | ICD-10-CM | POA: Diagnosis not present

## 2015-12-26 DIAGNOSIS — Z0100 Encounter for examination of eyes and vision without abnormal findings: Secondary | ICD-10-CM | POA: Diagnosis not present

## 2015-12-26 LAB — HM DIABETES EYE EXAM

## 2015-12-28 ENCOUNTER — Other Ambulatory Visit: Payer: Self-pay | Admitting: Internal Medicine

## 2015-12-28 ENCOUNTER — Encounter: Payer: Self-pay | Admitting: Internal Medicine

## 2016-01-05 ENCOUNTER — Encounter: Payer: Self-pay | Admitting: Internal Medicine

## 2016-01-05 ENCOUNTER — Other Ambulatory Visit: Payer: Self-pay | Admitting: Internal Medicine

## 2016-01-05 DIAGNOSIS — Z1231 Encounter for screening mammogram for malignant neoplasm of breast: Secondary | ICD-10-CM

## 2016-01-08 ENCOUNTER — Other Ambulatory Visit: Payer: Self-pay | Admitting: Internal Medicine

## 2016-01-11 ENCOUNTER — Encounter: Payer: Self-pay | Admitting: Internal Medicine

## 2016-01-12 NOTE — Telephone Encounter (Signed)
It looks like you were waiting for this information.

## 2016-01-16 ENCOUNTER — Other Ambulatory Visit: Payer: Self-pay | Admitting: Internal Medicine

## 2016-01-18 LAB — HM DIABETES EYE EXAM

## 2016-01-19 ENCOUNTER — Encounter: Payer: Self-pay | Admitting: Internal Medicine

## 2016-01-31 ENCOUNTER — Ambulatory Visit
Admission: RE | Admit: 2016-01-31 | Discharge: 2016-01-31 | Disposition: A | Discharge status: Home / Self Care | Payer: Medicare HMO | Source: Ambulatory Visit | Attending: Internal Medicine | Admitting: Internal Medicine

## 2016-01-31 DIAGNOSIS — Z1231 Encounter for screening mammogram for malignant neoplasm of breast: Secondary | ICD-10-CM | POA: Insufficient documentation

## 2016-02-02 ENCOUNTER — Ambulatory Visit (INDEPENDENT_AMBULATORY_CARE_PROVIDER_SITE_OTHER): Payer: Medicare HMO | Admitting: Vascular Surgery

## 2016-02-02 ENCOUNTER — Encounter (INDEPENDENT_AMBULATORY_CARE_PROVIDER_SITE_OTHER): Payer: Self-pay | Admitting: Vascular Surgery

## 2016-02-02 VITALS — BP 150/65 | HR 60 | Resp 16 | Ht 62.0 in | Wt 244.0 lb

## 2016-02-02 DIAGNOSIS — M79604 Pain in right leg: Secondary | ICD-10-CM

## 2016-02-02 DIAGNOSIS — Z794 Long term (current) use of insulin: Secondary | ICD-10-CM

## 2016-02-02 DIAGNOSIS — M79609 Pain in unspecified limb: Secondary | ICD-10-CM | POA: Insufficient documentation

## 2016-02-02 DIAGNOSIS — M79605 Pain in left leg: Secondary | ICD-10-CM | POA: Diagnosis not present

## 2016-02-02 DIAGNOSIS — E785 Hyperlipidemia, unspecified: Secondary | ICD-10-CM | POA: Diagnosis not present

## 2016-02-02 DIAGNOSIS — E1140 Type 2 diabetes mellitus with diabetic neuropathy, unspecified: Secondary | ICD-10-CM

## 2016-02-02 DIAGNOSIS — R600 Localized edema: Secondary | ICD-10-CM

## 2016-02-02 DIAGNOSIS — I10 Essential (primary) hypertension: Secondary | ICD-10-CM | POA: Diagnosis not present

## 2016-02-02 NOTE — Patient Instructions (Signed)

## 2016-02-02 NOTE — Assessment & Plan Note (Signed)
blood glucose control important in reducing the progression of atherosclerotic disease. Also, involved in wound healing. On appropriate medications.  

## 2016-02-02 NOTE — Assessment & Plan Note (Signed)
blood pressure control important in reducing the progression of atherosclerotic disease. On appropriate oral medications.  

## 2016-02-02 NOTE — Assessment & Plan Note (Signed)
Cause not entirely clear. Although her symptoms are not classic for claudication from arterial insufficiency, she does have a long list of atherosclerotic risk factors and I do think checking ABIs would be prudent. This will be done at her convenience.

## 2016-02-02 NOTE — Assessment & Plan Note (Signed)
lipid control important in reducing the progression of atherosclerotic disease. Continue statin therapy  

## 2016-02-02 NOTE — Assessment & Plan Note (Signed)
The patient has significant swelling and stasis changes present in both lower extremities. Right leg is worse than the left leg. With a long discussion about the natural history and pathophysiology of venous insufficiency as well as several other causes of lower extremity swelling. I recommended a venous duplex to be performed at the patient's convenience in the near future. I recommended she try compression stockings. She's had a hard time with compression stockings in the past that she cannot get these on and off easily. I have recommended she try these zippered compression stockings should be easier to get on and off. We will see her back following her noninvasive studies to discuss the results and determine further treatment options.

## 2016-02-02 NOTE — Progress Notes (Signed)
Patient ID: Beth Tyler, female   DOB: 1946/02/16, 70 y.o.   MRN: [id number]  Chief Complaint  Patient presents with  . New Evaluation    Bilateral Leg swelling    HPI Beth Tyler is a 70 y.o. female.  I am asked to see the patient by Dr. Dale Birch Hill for evaluation of lower extremity pain and swelling.  The patient presents with complaints of swelling with dry scaling skin and discoloration of the lower extremities bilaterally. The patient reports a long standing history of swelling and they have become painful over time. There was no clear inciting event or causative factor that started the symptoms.  The right leg is more severly affected. The patient elevates the legs for relief. The pain is described as aching and heaviness. She does have difficulty with walking due to cramping and pain in her legs, but this is not reliably relieved with rest. The symptoms are generally most severe in the evening, particularly when they have been on their feet for long periods of time. Compression stockings has been used to try to improve the symptoms with limited success in part because of difficulty getting these on and off. The patient complains of worsening throughout the day swelling as an associated symptom. The patient has no previous history of deep venous thrombosis or superficial thrombophlebitis to their knowledge.     Past Medical History:  Diagnosis Date  . Anemia   . Arthritis   . B12 deficiency   . Diabetes mellitus (HCC) 02/17/2012  . Diabetes mellitus, type II (HCC)   . Gastritis   . GERD (gastroesophageal reflux disease)   . Hypercholesterolemia   . Hypertension   . IBS (irritable bowel syndrome)   . Pernicious anemia     Past Surgical History:  Procedure Laterality Date  . BREAST BIOPSY Right 1980   neg  . BREAST SURGERY  1980   benign cyst removal  . CHOLECYSTECTOMY  1983    Family History  Problem Relation Age of Onset  . Diabetes Father   . Kidney  disease Father   . Heart disease Father   . Alcohol abuse Mother   . Dementia Mother   . Diabetes Mother   . Diabetes Other   . Heart disease Son 15    CABG  . Hemophilia Brother   . Uterine cancer Sister   . Diabetes Sister   . COPD Sister   . Breast cancer Maternal Aunt 51  . Breast cancer Daughter 36     Social History Social History  Substance Use Topics  . Smoking status: Never Smoker  . Smokeless tobacco: Never Used  . Alcohol use No  No IV drug use  Allergies  Allergen Reactions  . Hydrocodone-Acetaminophen Anaphylaxis  . Percocet [Oxycodone-Acetaminophen] Anaphylaxis  . Penicillins Nausea Only  . Tetracyclines & Related Nausea Only    Current Outpatient Prescriptions  Medication Sig Dispense Refill  . amLODipine (NORVASC) 10 MG tablet TAKE ONE TABLET BY MOUTH ONCE DAILY 90 tablet 1  . aspirin 81 MG tablet Take 81 mg by mouth daily.    Marland Kitchen atenolol (TENORMIN) 100 MG tablet TAKE ONE TABLET BY MOUTH ONCE DAILY IN THE MORNING AND ONE-HALF TABLET IN THE EVENING (Patient taking differently: TAKE ONE TABLET BY MOUTH ONE-HALF  DAILY IN THE MORNING AND ONE TABLET IN THE EVENING) 135 tablet 3  . cyanocobalamin (,VITAMIN B-12,) 1000 MCG/ML injection Inject 1,000 mcg into the muscle every 30 (thirty) days.    Marland Kitchen  ferrous fumarate (HEMOCYTE - 106 MG FE) 325 (106 FE) MG TABS tablet Take 1 tablet by mouth.    . fluticasone (CUTIVATE) 0.05 % cream USE AS DIRECTED 30 g 0  . glipiZIDE (GLUCOTROL XL) 5 MG 24 hr tablet TAKE ONE TABLET BY MOUTH ONCE DAILY WITH BREAKFAST 90 tablet 3  . hydrochlorothiazide (HYDRODIURIL) 25 MG tablet TAKE ONE TABLET BY MOUTH ONCE DAILY 90 tablet 3  . Insulin Detemir (LEVEMIR) 100 UNIT/ML Pen Inject 22 units in the morning & 12 units at bedtime 15 mL 11  . Insulin Pen Needle 32G X 8 MM MISC Use as directed three times daily Dx E11.9 300 each 1  . lisinopril (PRINIVIL,ZESTRIL) 20 MG tablet TAKE ONE TABLET IN THE MORNING AND TWO AT BEDTIME 90 tablet 3  .  magnesium oxide (MAG-OX) 400 (241.3 Mg) MG tablet TAKE ONE TABLET BY MOUTH ONCE DAILY 90 tablet 3  . metFORMIN (GLUCOPHAGE) 1000 MG tablet TAKE ONE TABLET BY MOUTH TWICE DAILY WITH MEALS 180 tablet 1  . omeprazole (PRILOSEC) 20 MG capsule TAKE ONE CAPSULE BY MOUTH TWICE DAILY 180 capsule 0  . pioglitazone (ACTOS) 30 MG tablet TAKE ONE TABLET BY MOUTH ONCE DAILY 90 tablet 3  . simvastatin (ZOCOR) 10 MG tablet TAKE ONE TABLET BY MOUTH AT BEDTIME 90 tablet 2  . triamcinolone cream (KENALOG) 0.1 % Apply 1 application topically 2 (two) times daily. 45 g 0   No current facility-administered medications for this visit.       REVIEW OF SYSTEMS (Negative unless checked)  Constitutional: [] Weight loss  [] Fever  [] Chills Cardiac: [] Chest pain   [] Chest pressure   [] Palpitations   [] Shortness of breath when laying flat   [] Shortness of breath at rest   [] Shortness of breath with exertion. Vascular:  [x] Pain in legs with walking   [] Pain in legs at rest   [] Pain in legs when laying flat   [] Claudication   [] Pain in feet when walking  [] Pain in feet at rest  [] Pain in feet when laying flat   [] History of DVT   [] Phlebitis   [x] Swelling in legs   [x] Varicose veins   [] Non-healing ulcers Pulmonary:   [] Uses home oxygen   [] Productive cough   [] Hemoptysis   [] Wheeze  [] COPD   [] Asthma Neurologic:  [] Dizziness  [] Blackouts   [] Seizures   [] History of stroke   [] History of TIA  [] Aphasia   [] Temporary blindness   [] Dysphagia   [] Weakness or numbness in arms   [] Weakness or numbness in legs Musculoskeletal:  [] Arthritis   [] Joint swelling   [] Joint pain   [] Low back pain Hematologic:  [] Easy bruising  [] Easy bleeding   [] Hypercoagulable state   [] Anemic  [] Hepatitis Gastrointestinal:  [] Blood in stool   [] Vomiting blood  [] Gastroesophageal reflux/heartburn   [] Abdominal pain Genitourinary:  [] Chronic kidney disease   [] Difficult urination  [] Frequent urination  [] Burning with urination   [] Hematuria Skin:   [] Rashes   [] Ulcers   [] Wounds Psychological:  [] History of anxiety   []  History of major depression.    Physical Exam BP (!) 150/65 (BP Location: Left Arm)   Pulse 60   Resp 16   Ht 5\' 2"  (1.575 m)   Wt 110.7 kg (244 lb)   BMI 44.63 kg/m  Gen:  WD/WN, NAD Head: Viv[place]/AT, No temporalis wasting.  Ear/Nose/Throat: Hearing grossly intact, Oropharynx clear Eyes: Sclera non-icteric. Conjunctiva clear Neck: Supple, no nuchal rigidity. Trachea midline Pulmonary:  Good air movement, no use of accessory muscles,  respirations not labored.  Cardiac: RRR, No JVD Vascular: Varicosities scattered and measuring up to 2-3 mm in the right lower extremity        Varicosities scattered and measuring up to 2-3 mm in the left lower extremity Vessel Right Left  Radial Palpable Palpable  Ulnar Palpable Palpable  Brachial Palpable Palpable  Carotid Palpable, without bruit Palpable, without bruit  Aorta Not palpable N/A  Femoral Palpable Palpable  Popliteal Palpable Palpable  PT Not Palpable Trace Palpable  DP 1+ Palpable Palpable   Gastrointestinal: soft, non-tender/non-distended. No guarding/reflex. No masses, surgical incisions, or scars. Musculoskeletal: M/S 5/5 throughout.   2-3 + RLE edema.  1-2 + LLE edema. Significant stasis dermatitis with scaling skin present on the medial lower calf bilaterally worse on the right than the left Neurologic: Sensation grossly intact in extremities.  Symmetrical.  Speech is fluent.  Psychiatric: Judgment intact, Mood & affect appropriate for pt's clinical situation. Dermatologic: No rashes or ulcers noted.  No cellulitis or open wounds. Lymph : No Cervical, Axillary, or Inguinal lymphadenopathy.   Radiology Mm Digital Screening Bilateral  Result Date: 01/31/2016 CLINICAL DATA:  Screening. EXAM: DIGITAL SCREENING BILATERAL MAMMOGRAM WITH CAD COMPARISON:  Previous exam(s). ACR Breast Density Category b: There are scattered areas of fibroglandular density.  FINDINGS: There are no findings suspicious for malignancy. Images were processed with CAD. IMPRESSION: No mammographic evidence of malignancy. A result letter of this screening mammogram will be mailed directly to the patient. RECOMMENDATION: Screening mammogram in one year. (Code:SM-B-01Y) BI-RADS CATEGORY  1: Negative. Electronically Signed   By: Britta MccreedySusan  Turner M.D.   On: 01/31/2016 17:08    Labs Recent Results (from the past 2160 hour(s))  HM DIABETES EYE EXAM     Status: None   Collection Time: 12/26/15 12:00 AM  Result Value Ref Range   HM Diabetic Eye Exam No Retinopathy No Retinopathy    Assessment/Plan:  Hypertension blood pressure control important in reducing the progression of atherosclerotic disease. On appropriate oral medications.   Diabetes mellitus blood glucose control important in reducing the progression of atherosclerotic disease. Also, involved in wound healing. On appropriate medications.   Bilateral leg edema The patient has significant swelling and stasis changes present in both lower extremities. Right leg is worse than the left leg. With a long discussion about the natural history and pathophysiology of venous insufficiency as well as several other causes of lower extremity swelling. I recommended a venous duplex to be performed at the patient's convenience in the near future. I recommended she try compression stockings. She's had a hard time with compression stockings in the past that she cannot get these on and off easily. I have recommended she try these zippered compression stockings should be easier to get on and off. We will see her back following her noninvasive studies to discuss the results and determine further treatment options.  Pain in limb Cause not entirely clear. Although her symptoms are not classic for claudication from arterial insufficiency, she does have a long list of atherosclerotic risk factors and I do think checking ABIs would be prudent.  This will be done at her convenience.  Hyperlipidemia lipid control important in reducing the progression of atherosclerotic disease. Continue statin therapy     The patient has symptoms consistent with chronic venous insufficiency. We discussed the natural history and treatment options for venous disease. I recommended the regular use of 20 - 30 mm Hg compression stockings, and prescribed these today. I recommended leg elevation and  anti-inflammatories as needed for pain. I have also recommended a complete venous duplex to assess the venous system for reflux or thrombotic issues. This can be done at the patient's convenience. I will see the patient back after the duplex to assess the response to conservative management, and determine further treatment options.     Festus Barren 02/02/2016, 2:59 PM   This note was created with Dragon medical transcription system.  Any errors from dictation are unintentional.

## 2016-02-13 ENCOUNTER — Ambulatory Visit (INDEPENDENT_AMBULATORY_CARE_PROVIDER_SITE_OTHER): Payer: Medicare HMO

## 2016-02-13 DIAGNOSIS — E538 Deficiency of other specified B group vitamins: Secondary | ICD-10-CM

## 2016-02-13 MED ORDER — CYANOCOBALAMIN 1000 MCG/ML IJ SOLN
1000.0000 ug | Freq: Once | INTRAMUSCULAR | Status: AC
  Administered 2016-02-13: 1000 ug via INTRAMUSCULAR

## 2016-02-13 NOTE — Progress Notes (Addendum)
Patient presents for B12 injection .  Injected right deltoid due to patient preferred due to left arm was hurting today.   Patient forgot to come in for injection last month.    Reviewed.  Dr Lorin PicketScott

## 2016-02-21 ENCOUNTER — Encounter: Payer: Self-pay | Admitting: Internal Medicine

## 2016-02-21 ENCOUNTER — Other Ambulatory Visit (INDEPENDENT_AMBULATORY_CARE_PROVIDER_SITE_OTHER): Payer: Medicare HMO

## 2016-02-21 DIAGNOSIS — Z794 Long term (current) use of insulin: Secondary | ICD-10-CM | POA: Diagnosis not present

## 2016-02-21 DIAGNOSIS — E7800 Pure hypercholesterolemia, unspecified: Secondary | ICD-10-CM | POA: Diagnosis not present

## 2016-02-21 DIAGNOSIS — E1140 Type 2 diabetes mellitus with diabetic neuropathy, unspecified: Secondary | ICD-10-CM

## 2016-02-21 LAB — HEPATIC FUNCTION PANEL
ALT: 19 U/L (ref 0–35)
AST: 22 U/L (ref 0–37)
Albumin: 4.5 g/dL (ref 3.5–5.2)
Alkaline Phosphatase: 80 U/L (ref 39–117)
Bilirubin, Direct: 0.1 mg/dL (ref 0.0–0.3)
TOTAL PROTEIN: 7.5 g/dL (ref 6.0–8.3)
Total Bilirubin: 0.5 mg/dL (ref 0.2–1.2)

## 2016-02-21 LAB — MICROALBUMIN / CREATININE URINE RATIO
CREATININE, U: 50.7 mg/dL
MICROALB UR: 14.1 mg/dL — AB (ref 0.0–1.9)
MICROALB/CREAT RATIO: 27.8 mg/g (ref 0.0–30.0)

## 2016-02-21 LAB — LIPID PANEL
CHOLESTEROL: 133 mg/dL (ref 0–200)
HDL: 55.9 mg/dL (ref >39.00)
LDL CALC: 55 mg/dL (ref 0–99)
NonHDL: 77.59
TRIGLYCERIDES: 111 mg/dL (ref 0.0–149.0)
Total CHOL/HDL Ratio: 2
VLDL: 22.2 mg/dL (ref 0.0–40.0)

## 2016-02-21 LAB — BASIC METABOLIC PANEL
BUN: 17 mg/dL (ref 6–23)
CALCIUM: 9.8 mg/dL (ref 8.4–10.5)
CO2: 30 mEq/L (ref 19–32)
Chloride: 104 mEq/L (ref 96–112)
Creatinine, Ser: 0.74 mg/dL (ref 0.40–1.20)
GFR: 82.13 mL/min (ref >60.00)
Glucose, Bld: 147 mg/dL — ABNORMAL HIGH (ref 70–99)
Potassium: 4.7 mEq/L (ref 3.5–5.1)
SODIUM: 143 meq/L (ref 135–145)

## 2016-02-21 LAB — HEMOGLOBIN A1C: HEMOGLOBIN A1C: 7.8 % — AB (ref 4.6–6.5)

## 2016-02-23 ENCOUNTER — Encounter: Payer: Self-pay | Admitting: Internal Medicine

## 2016-02-23 ENCOUNTER — Ambulatory Visit (INDEPENDENT_AMBULATORY_CARE_PROVIDER_SITE_OTHER): Payer: Medicare HMO | Admitting: Internal Medicine

## 2016-02-23 VITALS — BP 122/60 | HR 59 | Temp 98.4°F | Ht 62.0 in | Wt 243.2 lb

## 2016-02-23 DIAGNOSIS — E785 Hyperlipidemia, unspecified: Secondary | ICD-10-CM

## 2016-02-23 DIAGNOSIS — E538 Deficiency of other specified B group vitamins: Secondary | ICD-10-CM

## 2016-02-23 DIAGNOSIS — K589 Irritable bowel syndrome without diarrhea: Secondary | ICD-10-CM | POA: Diagnosis not present

## 2016-02-23 DIAGNOSIS — F439 Reaction to severe stress, unspecified: Secondary | ICD-10-CM

## 2016-02-23 DIAGNOSIS — Z0000 Encounter for general adult medical examination without abnormal findings: Secondary | ICD-10-CM | POA: Diagnosis not present

## 2016-02-23 DIAGNOSIS — R600 Localized edema: Secondary | ICD-10-CM

## 2016-02-23 DIAGNOSIS — R0602 Shortness of breath: Secondary | ICD-10-CM

## 2016-02-23 DIAGNOSIS — R69 Illness, unspecified: Secondary | ICD-10-CM | POA: Diagnosis not present

## 2016-02-23 DIAGNOSIS — Z794 Long term (current) use of insulin: Secondary | ICD-10-CM

## 2016-02-23 DIAGNOSIS — E1140 Type 2 diabetes mellitus with diabetic neuropathy, unspecified: Secondary | ICD-10-CM

## 2016-02-23 DIAGNOSIS — I10 Essential (primary) hypertension: Secondary | ICD-10-CM

## 2016-02-23 NOTE — Progress Notes (Signed)
Patient ID: Beth Tyler, female   DOB: 12/01/1945, 70 y.o.   MRN: [id number]   Subjective:    Patient ID: Beth Tyler, female    DOB: Apr 21, 1945, 70 y.o.   MRN: [id number]  HPI  Patient here for her physical exam.  She reports she is doing relatively well.  States her sugars have been doing some better.  AM sugars averaging 120s.  PM sugars 180-200s.  Discussed adjusting am insulin.  She is trying to watch her diet.  Tries to stay active.  Reports sob.  States having worsening sob with exertion.  Increased fatigue.  SOB limits her activity.  Bowels stable.  No nausea or vomiting.  Seeing vascular surgery for her legs.  Planning for ABIs and ultrasound testing.  Discussed lab results.     Past Medical History:  Diagnosis Date  . Anemia   . Arthritis   . B12 deficiency   . Diabetes mellitus ([place]) 02/17/2012  . Diabetes mellitus, type II ([place])   . Gastritis   . GERD (gastroesophageal reflux disease)   . Hypercholesterolemia   . Hypertension   . IBS (irritable bowel syndrome)   . Pernicious anemia    Past Surgical History:  Procedure Laterality Date  . BREAST BIOPSY Right 1980   neg  . BREAST SURGERY  1980   benign cyst removal  . CHOLECYSTECTOMY  1983   Family History  Problem Relation Age of Onset  . Diabetes Father   . Kidney disease Father   . Heart disease Father   . Alcohol abuse Mother   . Dementia Mother   . Diabetes Mother   . Diabetes Other   . Heart disease Son 54    CABG  . Hemophilia Brother   . Uterine cancer Sister   . Diabetes Sister   . COPD Sister   . Breast cancer Maternal Aunt 51  . Breast cancer Daughter 40   Social History   Social History  . Marital status: Married    Spouse name: N/A  . Number of children: 4  . Years of education: N/A   Social History Main Topics  . Smoking status: Never Smoker  . Smokeless tobacco: Never Used  . Alcohol use No  . Drug use: No  . Sexual activity: Not Currently   Other Topics Concern  . None    Social History Narrative   Lives with her husband. Has four children. Manager of an apartment building. No tobacco, alcohol or other drug use    Outpatient Encounter Prescriptions as of 02/23/2016  Medication Sig  . amLODipine (NORVASC) 10 MG tablet TAKE ONE TABLET BY MOUTH ONCE DAILY  . aspirin 81 MG tablet Take 81 mg by mouth daily.  Marland Kitchen atenolol (TENORMIN) 100 MG tablet TAKE ONE TABLET BY MOUTH ONCE DAILY IN THE MORNING AND ONE-HALF TABLET IN THE EVENING (Patient taking differently: TAKE ONE TABLET BY MOUTH ONE-HALF  DAILY IN THE MORNING AND ONE TABLET IN THE EVENING)  . cyanocobalamin (,VITAMIN B-12,) 1000 MCG/ML injection Inject 1,000 mcg into the muscle every 30 (thirty) days.  . ferrous fumarate (HEMOCYTE - 106 MG FE) 325 (106 FE) MG TABS tablet Take 1 tablet by mouth.  . fluticasone (CUTIVATE) 0.05 % cream USE AS DIRECTED  . glipiZIDE (GLUCOTROL XL) 5 MG 24 hr tablet TAKE ONE TABLET BY MOUTH ONCE DAILY WITH BREAKFAST  . hydrochlorothiazide (HYDRODIURIL) 25 MG tablet TAKE ONE TABLET BY MOUTH ONCE DAILY  . Insulin Detemir (LEVEMIR) 100 UNIT/ML Pen  Inject 22 units in the morning & 12 units at bedtime  . Insulin Pen Needle 32G X 8 MM MISC Use as directed three times daily Dx E11.9  . lisinopril (PRINIVIL,ZESTRIL) 20 MG tablet TAKE ONE TABLET IN THE MORNING AND TWO AT BEDTIME  . magnesium oxide (MAG-OX) 400 (241.3 Mg) MG tablet TAKE ONE TABLET BY MOUTH ONCE DAILY  . metFORMIN (GLUCOPHAGE) 1000 MG tablet TAKE ONE TABLET BY MOUTH TWICE DAILY WITH MEALS  . omeprazole (PRILOSEC) 20 MG capsule TAKE ONE CAPSULE BY MOUTH TWICE DAILY  . pioglitazone (ACTOS) 30 MG tablet TAKE ONE TABLET BY MOUTH ONCE DAILY  . simvastatin (ZOCOR) 10 MG tablet TAKE ONE TABLET BY MOUTH AT BEDTIME  . triamcinolone cream (KENALOG) 0.1 % Apply 1 application topically 2 (two) times daily.   No facility-administered encounter medications on file as of 02/23/2016.     Review of Systems  Constitutional: Positive for  fatigue. Negative for appetite change and unexpected weight change.  HENT: Negative for congestion and sinus pressure.   Eyes: Negative for pain and visual disturbance.  Respiratory: Positive for shortness of breath. Negative for cough and chest tightness.   Cardiovascular: Positive for leg swelling. Negative for chest pain and palpitations.  Gastrointestinal: Negative for abdominal pain, diarrhea, nausea and vomiting.  Genitourinary: Negative for difficulty urinating and dysuria.  Musculoskeletal: Negative for back pain and myalgias.  Skin: Negative for color change and rash.  Neurological: Negative for dizziness, light-headedness and headaches.  Hematological: Negative for adenopathy. Does not bruise/bleed easily.  Psychiatric/Behavioral: Negative for agitation and dysphoric mood.       Increased stress.        Objective:    Physical Exam  Constitutional: She is oriented to person, place, and time. She appears well-developed and well-nourished. No distress.  HENT:  Nose: Nose normal.  Mouth/Throat: Oropharynx is clear and moist.  Eyes: Right eye exhibits no discharge. Left eye exhibits no discharge. No scleral icterus.  Neck: Neck supple. No thyromegaly present.  Cardiovascular: Normal rate and regular rhythm.   Pulmonary/Chest: Breath sounds normal. No accessory muscle usage. No tachypnea. No respiratory distress. She has no decreased breath sounds. She has no wheezes. She has no rhonchi. Right breast exhibits no inverted nipple, no mass, no nipple discharge and no tenderness (no axillary adenopathy). Left breast exhibits no inverted nipple, no mass, no nipple discharge and no tenderness (no axilarry adenopathy).  Abdominal: Soft. Bowel sounds are normal. There is no tenderness.  Musculoskeletal: She exhibits no tenderness.  Stable edema.  Decreased erythema when compared to previous.  Some stasis changes present.    Lymphadenopathy:    She has no cervical adenopathy.    Neurological: She is alert and oriented to person, place, and time.  Skin: Skin is warm. No rash noted.  Psychiatric: She has a normal mood and affect. Her behavior is normal.    BP 122/60   Pulse (!) 59   Temp 98.4 F (36.9 C) (Oral)   Ht _0  (1.575 m)   Wt 243 lb 3.2 oz (110.3 kg)   SpO2 98%   BMI 44.48 kg/m  Wt Readings from Last 3 Encounters:  02/23/16 243 lb 3.2 oz (110.3 kg)  02/02/16 244 lb (110.7 kg)  11/14/15 241 lb (109.3 kg)     Lab Results  Component Value Date   WBC 9.1 09/19/2015   HGB 12.7 09/19/2015   HCT 38.9 09/19/2015   PLT 239.0 09/19/2015   GLUCOSE 147 (H) 02/21/2016  CHOL 133 02/21/2016   TRIG 111.0 02/21/2016   HDL 55.90 02/21/2016   LDLDIRECT 74.4 02/21/2012   LDLCALC 55 02/21/2016   ALT 19 02/21/2016   AST 22 02/21/2016   NA 143 02/21/2016   K 4.7 02/21/2016   CL 104 02/21/2016   CREATININE 0.74 02/21/2016   BUN 17 02/21/2016   CO2 30 02/21/2016   TSH 2.21 09/19/2015   HGBA1C 7.8 (H) 02/21/2016   MICROALBUR 14.1 (H) 02/21/2016    Mm Digital Screening Bilateral  Result Date: 01/31/2016 CLINICAL DATA:  Screening. EXAM: DIGITAL SCREENING BILATERAL MAMMOGRAM WITH CAD COMPARISON:  Previous exam(s). ACR Breast Density Category b: There are scattered areas of fibroglandular density. FINDINGS: There are no findings suspicious for malignancy. Images were processed with CAD. IMPRESSION: No mammographic evidence of malignancy. A result letter of this screening mammogram will be mailed directly to the patient. RECOMMENDATION: Screening mammogram in one year. (Code:SM-B-01Y) BI-RADS CATEGORY  1: Negative. Electronically Signed   By: Curlene Dolphin M.D.   On: 01/31/2016 17:08       Assessment & Plan:   Problem List Items Addressed This Visit    B12 deficiency    Continue b12 injections.        Bilateral leg edema    Seeing vascular surgery.  Trying to wear compression hose.  Planning for ABIs and ultrasound.  Follow.        Diabetes  mellitus ([place])    Discussed sugars and diet and exercise.  Increased am insulin to 24 units.  Follow sugars.  Send in readings over the next few weeks.  Continue to adjust.  Up to date with eye exams.  Follow met b and a1c.       Health care maintenance    Physical today 02/23/16.  Mammogram 01/31/16 - Birads I.  colnoscopy 11/29/11 - one rectal polyp and diverticulosis.   Recommended f/u colonoscopy in 10 years.        Hyperlipidemia    Low cholesterol diet and exercise.  On simvastatin.  Follow lipid panel and liver function tests.        Hypertension    Blood pressure under good control.  Continue same medication regimen.  Follow pressures.  Follow metabolic panel.        IBS (irritable bowel syndrome)    Stable.        Morbid obesity ([place])    Diet and exercise.  Follow.       SOB (shortness of breath) - Primary    Has sob with exertion.  Worsening.  EKG -SR with no acute ischemic changes.  Discussed further w/up.  She previously saw cardiology for vague chest pain.  Now with persistent worsening sob, refer back to cardiology for further evaluation and question of need for further testing, i.e., echo, etc.  Check cxr.        Relevant Orders   EKG 12-Lead (Completed)   DG Chest 2 View   Stress    Increased stress as outlined.  Discussed with her today.  She does not feel needs any further evaluation.  Follow.            Einar Pheasant, MD

## 2016-02-23 NOTE — Assessment & Plan Note (Signed)
Physical today 02/23/16.  Mammogram 01/31/16 - Birads I.  colnoscopy 11/29/11 - one rectal polyp and diverticulosis.   Recommended f/u colonoscopy in 10 years.

## 2016-02-23 NOTE — Patient Instructions (Signed)
Increase am levemer to 24 units.  Send in readings over the next 2-3 weeks.

## 2016-02-23 NOTE — Progress Notes (Signed)
Pre visit review using our clinic review tool, if applicable. No additional management support is needed unless otherwise documented below in the visit note. 

## 2016-02-25 ENCOUNTER — Encounter: Payer: Self-pay | Admitting: Internal Medicine

## 2016-02-25 DIAGNOSIS — R0602 Shortness of breath: Secondary | ICD-10-CM | POA: Insufficient documentation

## 2016-02-25 NOTE — Assessment & Plan Note (Signed)
Seeing vascular surgery.  Trying to wear compression hose.  Planning for ABIs and ultrasound.  Follow.

## 2016-02-25 NOTE — Assessment & Plan Note (Signed)
Continue b12 injections.  

## 2016-02-25 NOTE — Assessment & Plan Note (Signed)
Blood pressure under good control.  Continue same medication regimen.  Follow pressures.  Follow metabolic panel.   

## 2016-02-25 NOTE — Assessment & Plan Note (Signed)
Increased stress as outlined.  Discussed with her today.  She does not feel needs any further evaluation.  Follow.   

## 2016-02-25 NOTE — Assessment & Plan Note (Signed)
Stable

## 2016-02-25 NOTE — Assessment & Plan Note (Signed)
Low cholesterol diet and exercise.  On simvastatin.  Follow lipid panel and liver function tests.   

## 2016-02-25 NOTE — Assessment & Plan Note (Signed)
Diet and exercise.  Follow.  

## 2016-02-25 NOTE — Assessment & Plan Note (Signed)
Has sob with exertion.  Worsening.  EKG -SR with no acute ischemic changes.  Discussed further w/up.  She previously saw cardiology for vague chest pain.  Now with persistent worsening sob, refer back to cardiology for further evaluation and question of need for further testing, i.e., echo, etc.  Check cxr.

## 2016-02-25 NOTE — Assessment & Plan Note (Signed)
Discussed sugars and diet and exercise.  Increased am insulin to 24 units.  Follow sugars.  Send in readings over the next few weeks.  Continue to adjust.  Up to date with eye exams.  Follow met b and a1c.

## 2016-02-27 ENCOUNTER — Ambulatory Visit (INDEPENDENT_AMBULATORY_CARE_PROVIDER_SITE_OTHER): Payer: Medicare HMO

## 2016-02-27 DIAGNOSIS — R0602 Shortness of breath: Secondary | ICD-10-CM

## 2016-02-27 DIAGNOSIS — R69 Illness, unspecified: Secondary | ICD-10-CM | POA: Diagnosis not present

## 2016-02-28 ENCOUNTER — Encounter: Payer: Self-pay | Admitting: Internal Medicine

## 2016-03-01 ENCOUNTER — Other Ambulatory Visit: Payer: Self-pay | Admitting: Internal Medicine

## 2016-03-01 NOTE — Telephone Encounter (Signed)
Refill request for Kenalog, last seen [id number], last filled [id number].  Please advise.

## 2016-03-08 ENCOUNTER — Other Ambulatory Visit: Payer: Self-pay | Admitting: Internal Medicine

## 2016-03-11 ENCOUNTER — Encounter (INDEPENDENT_AMBULATORY_CARE_PROVIDER_SITE_OTHER): Payer: Self-pay | Admitting: Vascular Surgery

## 2016-03-11 ENCOUNTER — Ambulatory Visit (INDEPENDENT_AMBULATORY_CARE_PROVIDER_SITE_OTHER): Payer: Medicare HMO | Admitting: Vascular Surgery

## 2016-03-11 ENCOUNTER — Ambulatory Visit (INDEPENDENT_AMBULATORY_CARE_PROVIDER_SITE_OTHER): Payer: Medicare HMO

## 2016-03-11 VITALS — BP 150/68 | HR 60 | Resp 16 | Ht 62.0 in | Wt 238.0 lb

## 2016-03-11 DIAGNOSIS — I10 Essential (primary) hypertension: Secondary | ICD-10-CM

## 2016-03-11 DIAGNOSIS — Z794 Long term (current) use of insulin: Secondary | ICD-10-CM

## 2016-03-11 DIAGNOSIS — R600 Localized edema: Secondary | ICD-10-CM

## 2016-03-11 DIAGNOSIS — M79605 Pain in left leg: Secondary | ICD-10-CM | POA: Diagnosis not present

## 2016-03-11 DIAGNOSIS — I890 Lymphedema, not elsewhere classified: Secondary | ICD-10-CM | POA: Diagnosis not present

## 2016-03-11 DIAGNOSIS — M79604 Pain in right leg: Secondary | ICD-10-CM | POA: Diagnosis not present

## 2016-03-11 DIAGNOSIS — E1140 Type 2 diabetes mellitus with diabetic neuropathy, unspecified: Secondary | ICD-10-CM

## 2016-03-11 NOTE — Progress Notes (Signed)
Subjective:    Patient ID: Beth Tyler, female    DOB: Aug 13, 1945, 70 y.o.   MRN: [id number] Chief Complaint  Patient presents with  . Re-evaluation    Ultrasound follow up   Patient first seen on 02/02/16 referred by Dr. Dale Durhamharlene Scott for evaluation of lower extremity pain and swelling. The patient presented with complaints of swelling with dry scaling skin and discoloration of the lower extremities bilaterally. The patient reports a long standing history of swelling and they have become painful over time. There was no clear inciting event or causative factor that started the symptoms.  The right leg is more severely affected. The patient elevates the legs for relief. The pain is described as aching and heaviness. She does have difficulty with walking due to cramping and pain in her legs, but this is not reliably relieved with rest. The symptoms are generally most severe in the evening, particularly when they have been on their feet for long periods of time. The patient complains of worsening throughout the day swelling as an associated symptom. The patient has no previous history of deep venous thrombosis or superficial thrombophlebitis to their knowledge. The patient underwent an ABI which showed Right ABI: 1.11 and Left 1.19 with great toe pressure and PPG waveform within normal limits. A bilateral venous duplex was negative for DVT, SVT and venous reflux.    Review of Systems Constitutional: [] Weight loss  [] Fever  [] Chills Cardiac: [] Chest pain   [] Chest pressure   [] Palpitations   [] Shortness of breath when laying flat   [] Shortness of breath at rest   [] Shortness of breath with exertion. Vascular:  [x] Pain in legs with walking   [] Pain in legs at rest   [] Pain in legs when laying flat   [] Claudication   [] Pain in feet when walking  [] Pain in feet at rest  [] Pain in feet when laying flat   [] History of DVT   [] Phlebitis   [x] Swelling in legs   [x] Varicose veins   [] Non-healing  ulcers Pulmonary:   [] Uses home oxygen   [] Productive cough   [] Hemoptysis   [] Wheeze  [] COPD   [] Asthma Neurologic:  [] Dizziness  [] Blackouts   [] Seizures   [] History of stroke   [] History of TIA  [] Aphasia   [] Temporary blindness   [] Dysphagia   [] Weakness or numbness in arms   [] Weakness or numbness in legs Musculoskeletal:  [] Arthritis   [] Joint swelling   [] Joint pain   [] Low back pain Hematologic:  [] Easy bruising  [] Easy bleeding   [] Hypercoagulable state   [] Anemic  [] Hepatitis Gastrointestinal:  [] Blood in stool   [] Vomiting blood  [] Gastroesophageal reflux/heartburn   [] Abdominal pain Genitourinary:  [] Chronic kidney disease   [] Difficult urination  [] Frequent urination  [] Burning with urination   [] Hematuria Skin:  [] Rashes   [] Ulcers   [] Wounds Psychological:  [] History of anxiety   []  History of major depression.    Objective:   Physical Exam Gen:  WD/WN, NAD Head: Dunn/AT, No temporalis wasting.  Ear/Nose/Throat: Hearing grossly intact, Oropharynx clear Eyes: Sclera non-icteric. Conjunctiva clear Neck: Supple, no nuchal rigidity. Trachea midline Pulmonary:  Good air movement, no use of accessory muscles, respirations not labored.  Cardiac: RRR, No JVD Vascular: Varicosities scattered and measuring up to 2-3 mm in the right lower extremity                   Varicosities scattered and measuring up to 2-3 mm in the left lower extremity Vessel Right Left  Radial Palpable Palpable  Ulnar Palpable Palpable  Brachial Palpable Palpable  Carotid Palpable, without bruit Palpable, without bruit  Aorta Not palpable N/A  Femoral Palpable Palpable  Popliteal Palpable Palpable  PT Not Palpable Trace Palpable  DP 1+ Palpable Palpable   Gastrointestinal: soft, non-tender/non-distended. No guarding/reflex. No masses, surgical incisions, or scars. Musculoskeletal: M/S 5/5 throughout.   2-3 + RLE edema.  1-2 + LLE edema. Significant stasis dermatitis with scaling skin present on the medial  lower calf bilaterally worse on the right than the left Neurologic: Sensation grossly intact in extremities.  Symmetrical.  Speech is fluent.  Psychiatric: Judgment intact, Mood & affect appropriate for pt's clinical situation. Dermatologic: No rashes or ulcers noted.  No cellulitis or open wounds. Lymph : No Cervical, Axillary, or Inguinal lymphadenopathy.  BP (!) 150/68 (BP Location: Right Arm)   Pulse 60   Resp 16   Ht 5\' 2"  (1.575 m)   Wt 238 lb (108 kg)   BMI 43.53 kg/m   Past Medical History:  Diagnosis Date  . Anemia   . Arthritis   . B12 deficiency   . Diabetes mellitus (HCC) 02/17/2012  . Diabetes mellitus, type II (HCC)   . Gastritis   . GERD (gastroesophageal reflux disease)   . Hypercholesterolemia   . Hypertension   . IBS (irritable bowel syndrome)   . Pernicious anemia    Social History   Social History  . Marital status: Married    Spouse name: N/A  . Number of children: 4  . Years of education: N/A   Occupational History  . Not on file.   Social History Main Topics  . Smoking status: Never Smoker  . Smokeless tobacco: Never Used  . Alcohol use No  . Drug use: No  . Sexual activity: Not Currently   Other Topics Concern  . Not on file   Social History Narrative   Lives with her husband. Has four children. Manager of an apartment building. No tobacco, alcohol or other drug use   Past Surgical History:  Procedure Laterality Date  . BREAST BIOPSY Right 1980   neg  . BREAST SURGERY  1980   benign cyst removal  . CHOLECYSTECTOMY  1983   Family History  Problem Relation Age of Onset  . Diabetes Father   . Kidney disease Father   . Heart disease Father   . Alcohol abuse Mother   . Dementia Mother   . Diabetes Mother   . Diabetes Other   . Heart disease Son [id number]    CABG  . Hemophilia Brother   . Uterine cancer Sister   . Diabetes Sister   . COPD Sister   . Breast cancer Maternal Aunt 51  . Breast cancer Daughter [phone]   Allergies   Allergen Reactions  . Hydrocodone-Acetaminophen Anaphylaxis  . Percocet [Oxycodone-Acetaminophen] Anaphylaxis  . Penicillins Nausea Only  . Tetracyclines & Related Nausea Only      Assessment & Plan:  Patient first seen on 02/02/16 referred by Dr. Dale Durhamharlene Scott for evaluation of lower extremity pain and swelling. The patient presented with complaints of swelling with dry scaling skin and discoloration of the lower extremities bilaterally. The patient reports a long standing history of swelling and they have become painful over time. There was no clear inciting event or causative factor that started the symptoms.  The right leg is more severely affected. The patient elevates the legs for relief. The pain is described as aching and heaviness. She does  have difficulty with walking due to cramping and pain in her legs, but this is not reliably relieved with rest. The symptoms are generally most severe in the evening, particularly when they have been on their feet for long periods of time. The patient complains of worsening throughout the day swelling as an associated symptom. The patient has no previous history of deep venous thrombosis or superficial thrombophlebitis to their knowledge. The patient underwent an ABI which showed Right ABI: 1.11 and Left 1.19 with great toe pressure and PPG waveform within normal limits. A bilateral venous duplex was negative for DVT, SVT and venous reflux.   1. Lymphedema - New Studies reviewed with the patient. I spent time discussing her duplex results, the difference between venous insufficiency vs lymphedema and why her swelling is not venous in origin. I discussed lymphedema and what symptoms it causes and how to manage them. The patient was encouraged to wear graduated compression stockings (20-30 mmHg) on a daily basis. The patient was instructed to begin wearing the stockings first thing in the morning and removing them in the evening. The patient was instructed  specifically not to sleep in the stockings. Prescription given. In addition, behavioral modification including elevation during the day will be initiated. We discussed a lymphedema pump if conventional therapy goes not work. The patient was advised to follow up in three months after wearing her compression stockings daily with elevation. Information on compression stockings, lymphedema and the lymphedema pump was given to the patient. The patient was instructed to call the office in the interim if any worsening edema or ulcerations to the legs, feet or toes occurs. The patient expresses their understanding.  2. Type 2 diabetes mellitus with diabetic neuropathy, with long-term current use of insulin (HCC) - Stable Encouraged good control as its slows the progression of atherosclerotic disease  3. Essential hypertension - Stable Encouraged good control as its slows the progression of atherosclerotic disease  Current Outpatient Prescriptions on File Prior to Visit  Medication Sig Dispense Refill  . amLODipine (NORVASC) 10 MG tablet TAKE ONE TABLET BY MOUTH ONCE DAILY 90 tablet 1  . aspirin 81 MG tablet Take 81 mg by mouth daily.    Marland Kitchen atenolol (TENORMIN) 100 MG tablet TAKE ONE TABLET BY MOUTH ONCE DAILY IN THE MORNING AND ONE-HALF TABLET IN THE EVENING (Patient taking differently: TAKE ONE TABLET BY MOUTH ONE-HALF  DAILY IN THE MORNING AND ONE TABLET IN THE EVENING) 135 tablet 3  . cyanocobalamin (,VITAMIN B-12,) 1000 MCG/ML injection Inject 1,000 mcg into the muscle every 30 (thirty) days.    . ferrous fumarate (HEMOCYTE - 106 MG FE) 325 (106 FE) MG TABS tablet Take 1 tablet by mouth.    . fluticasone (CUTIVATE) 0.05 % cream USE AS DIRECTED 30 g 0  . glipiZIDE (GLUCOTROL XL) 5 MG 24 hr tablet TAKE ONE TABLET BY MOUTH ONCE DAILY WITH BREAKFAST 90 tablet 3  . hydrochlorothiazide (HYDRODIURIL) 25 MG tablet TAKE ONE TABLET BY MOUTH ONCE DAILY 90 tablet 3  . Insulin Detemir (LEVEMIR) 100 UNIT/ML Pen  Inject 22 units in the morning & 12 units at bedtime 15 mL 11  . Insulin Pen Needle 32G X 8 MM MISC Use as directed three times daily Dx E11.9 300 each 1  . lisinopril (PRINIVIL,ZESTRIL) 20 MG tablet TAKE ONE TABLET IN THE MORNING AND TWO AT BEDTIME 90 tablet 3  . magnesium oxide (MAG-OX) 400 (241.3 Mg) MG tablet TAKE ONE TABLET BY MOUTH ONCE DAILY 90 tablet  3  . metFORMIN (GLUCOPHAGE) 1000 MG tablet TAKE ONE TABLET BY MOUTH TWICE DAILY WITH MEALS 180 tablet 1  . omeprazole (PRILOSEC) 20 MG capsule TAKE ONE CAPSULE BY MOUTH TWICE DAILY 180 capsule 0  . pioglitazone (ACTOS) 30 MG tablet TAKE ONE TABLET BY MOUTH ONCE DAILY 90 tablet 3  . simvastatin (ZOCOR) 10 MG tablet TAKE ONE TABLET BY MOUTH AT BEDTIME 90 tablet 2  . triamcinolone cream (KENALOG) 0.1 % APPLY 1 APPLICATION TOPICALLY 2 TIMES A DAY 45 g 0   No current facility-administered medications on file prior to visit.    There are no Patient Instructions on file for this visit. Return in about 2 months (around 05/12/2016) for Lymphedema Follow Up.  KIMBERLY A STEGMAYER, PA-C

## 2016-03-19 ENCOUNTER — Ambulatory Visit (INDEPENDENT_AMBULATORY_CARE_PROVIDER_SITE_OTHER): Payer: Medicare HMO

## 2016-03-19 DIAGNOSIS — E538 Deficiency of other specified B group vitamins: Secondary | ICD-10-CM

## 2016-03-19 MED ORDER — CYANOCOBALAMIN 1000 MCG/ML IJ SOLN
1000.0000 ug | Freq: Once | INTRAMUSCULAR | Status: AC
  Administered 2016-03-19: 1000 ug via INTRAMUSCULAR

## 2016-03-19 NOTE — Progress Notes (Addendum)
Patient comes in for B 12 injection.  Injected left deltoid.  Patient tolerated injection well.   Reviewed.  Dr Scott 

## 2016-03-29 ENCOUNTER — Other Ambulatory Visit: Payer: Self-pay | Admitting: Internal Medicine

## 2016-04-05 ENCOUNTER — Ambulatory Visit (INDEPENDENT_AMBULATORY_CARE_PROVIDER_SITE_OTHER): Payer: Medicare HMO

## 2016-04-05 VITALS — BP 128/62 | HR 60 | Temp 97.7°F | Resp 14 | Ht 62.0 in | Wt 242.0 lb

## 2016-04-05 DIAGNOSIS — Z0000 Encounter for general adult medical examination without abnormal findings: Secondary | ICD-10-CM | POA: Diagnosis not present

## 2016-04-05 MED ORDER — INSULIN DETEMIR 100 UNIT/ML FLEXPEN: PEN_INJECTOR | SUBCUTANEOUS | 11 refills | Status: DC

## 2016-04-05 NOTE — Progress Notes (Signed)
Subjective:   Beth Tyler is a 70 y.o. female who presents for Medicare Annual (Subsequent) preventive examination.  Review of Systems:  No ROS.  Medicare Wellness Visit.  Cardiac Risk Factors include: advanced age (>2955men, 91>65 women);hypertension;diabetes mellitus;obesity (BMI >30kg/m2)     Objective:     Vitals: BP 128/62 (BP Location: Left Arm, Patient Position: Sitting, Cuff Size: Large)   Pulse 60   Temp 97.7 F (36.5 C) (Oral)   Resp 14   Ht 5\' 2"  (1.575 m)   Wt 242 lb (109.8 kg)   SpO2 95%   BMI 44.26 kg/m   Body mass index is 44.26 kg/m.   Tobacco History  Smoking Status  . Never Smoker  Smokeless Tobacco  . Never Used     Counseling given: Not Answered   Past Medical History:  Diagnosis Date  . Anemia   . Arthritis   . B12 deficiency   . Diabetes mellitus (HCC) 02/17/2012  . Diabetes mellitus, type II (HCC)   . Gastritis   . GERD (gastroesophageal reflux disease)   . Hypercholesterolemia   . Hypertension   . IBS (irritable bowel syndrome)   . Pernicious anemia    Past Surgical History:  Procedure Laterality Date  . BREAST BIOPSY Right 1980   neg  . BREAST SURGERY  1980   benign cyst removal  . CHOLECYSTECTOMY  1983   Family History  Problem Relation Age of Onset  . Diabetes Father   . Kidney disease Father   . Heart disease Father   . Alcohol abuse Mother   . Dementia Mother   . Diabetes Mother   . Diabetes Other   . Heart disease Son [id number]    CABG  . Hemophilia Brother   . Uterine cancer Sister   . Diabetes Sister   . COPD Sister   . Breast cancer Maternal Aunt 51  . Breast cancer Daughter [id number]   History  Sexual Activity  . Sexual activity: Not Currently    Outpatient Encounter Prescriptions as of 04/05/2016  Medication Sig  . amLODipine (NORVASC) 10 MG tablet TAKE ONE TABLET BY MOUTH ONCE DAILY  . aspirin 81 MG tablet Take 81 mg by mouth daily.  Marland Kitchen. atenolol (TENORMIN) 100 MG tablet TAKE ONE TABLET BY MOUTH ONCE DAILY IN  THE MORNING AND ONE-HALF TABLET IN THE EVENING (Patient taking differently: TAKE ONE TABLET BY MOUTH ONE-HALF  DAILY IN THE MORNING AND ONE TABLET IN THE EVENING)  . cyanocobalamin (,VITAMIN B-12,) 1000 MCG/ML injection Inject 1,000 mcg into the muscle every 30 (thirty) days.  . ferrous fumarate (HEMOCYTE - 106 MG FE) 325 (106 FE) MG TABS tablet Take 1 tablet by mouth.  . fluticasone (CUTIVATE) 0.05 % cream USE AS DIRECTED  . glipiZIDE (GLUCOTROL XL) 5 MG 24 hr tablet TAKE ONE TABLET BY MOUTH ONCE DAILY WITH BREAKFAST  . hydrochlorothiazide (HYDRODIURIL) 25 MG tablet TAKE ONE TABLET BY MOUTH ONCE DAILY  . Insulin Detemir (LEVEMIR) 100 UNIT/ML Pen Inject 24 units in the morning & 12 units at bedtime  . Insulin Pen Needle 32G X 8 MM MISC Use as directed three times daily Dx E11.9  . lisinopril (PRINIVIL,ZESTRIL) 20 MG tablet TAKE ONE TABLET BY MOUTH ONCE DAILY IN THE MORNING AND TWO AT BEDTIME  . magnesium oxide (MAG-OX) 400 (241.3 Mg) MG tablet TAKE ONE TABLET BY MOUTH ONCE DAILY  . metFORMIN (GLUCOPHAGE) 1000 MG tablet TAKE ONE TABLET BY MOUTH TWICE DAILY WITH MEALS  . omeprazole (  PRILOSEC) 20 MG capsule TAKE ONE CAPSULE BY MOUTH TWICE DAILY  . pioglitazone (ACTOS) 30 MG tablet TAKE ONE TABLET BY MOUTH ONCE DAILY  . simvastatin (ZOCOR) 10 MG tablet TAKE ONE TABLET BY MOUTH AT BEDTIME  . triamcinolone cream (KENALOG) 0.1 % APPLY 1 APPLICATION TOPICALLY 2 TIMES A DAY  . [DISCONTINUED] Insulin Detemir (LEVEMIR) 100 UNIT/ML Pen Inject 22 units in the morning & 12 units at bedtime   No facility-administered encounter medications on file as of 04/05/2016.     Activities of Daily Living In your present state of health, do you have any difficulty performing the following activities: 04/05/2016 04/06/2015  Hearing? N N  Vision? N N  Difficulty concentrating or making decisions? N N  Walking or climbing stairs? Y Y  Dressing or bathing? N N  Doing errands, shopping? N N  Preparing Food and eating ?  N N  Using the Toilet? N N  In the past six months, have you accidently leaked urine? N N  Do you have problems with loss of bowel control? N Y  Managing your Medications? N N  Managing your Finances? N N  Housekeeping or managing your Housekeeping? N N  Some recent data might be hidden    Patient Care Team: Dale Durhamharlene Scott, MD as PCP - General (Internal Medicine)    Assessment:    This is a routine wellness examination for Beth Tyler. The goal of the wellness visit is to assist the patient how to close the gaps in care and create a preventative care plan for the patient.   Osteoporosis risk reviewed.  Medications reviewed; taking without issues or barriers.  Safety issues reviewed; smoke detectors in the home. No firearms in the home. Wears seatbelts when driving or riding with others. No violence in the home.  No identified risk were noted; The patient was oriented x 3; appropriate in dress and manner and no objective failures at ADL's or IADL's.   BMI; discussed the importance of a healthy diet, water intake and exercise. Educational material provided.  HTN; followed by PCP.  TDAP and ZOSTAVAX vaccine deferred for follow up with insurance, per patient preference.  Pneumovax 23 vaccine declined.  Educational material provided.  Patient Concerns: Insulin Detemir was changed form 22 units in the morning to 24 units in the morning according to last visit with PCP.  She has been admistering the dose without complication.  Medication updated to reflect.  Exercise Activities and Dietary recommendations Current Exercise Habits: Home exercise routine (Chair/standing activities. Walking as tolerated.), Type of exercise: walking, Time (Minutes): 20, Frequency (Times/Week): 4, Weekly Exercise (Minutes/Week): 80, Intensity: Mild  Goals    . Healthy lifestyle          Stay hydrated and drink plenty of water. Low carb foods. Lean meats, vegetables. Stay active and continue exercise  regimen.      Fall Risk Fall Risk  04/05/2016 02/23/2016 04/06/2015 10/15/2013 06/28/2012  Falls in the past year? No No Yes No No  Number falls in past yr: - - 2 or more - -  Injury with Fall? - - No - -  Follow up - - Education provided;Falls prevention discussed - -   Depression Screen PHQ 2/9 Scores 04/05/2016 02/23/2016 04/06/2015 10/15/2013  PHQ - 2 Score 0 0 0 0     Cognitive Function MMSE - Mini Mental State Exam 04/05/2016 04/06/2015  Orientation to time 5 5  Orientation to Place 5 5  Registration 3 3  Attention/ Calculation  5 5  Recall 3 3  Language- name 2 objects 2 2  Language- repeat 1 1  Language- follow 3 step command 3 3  Language- read & follow direction 1 1  Write a sentence 1 1  Copy design 1 1  Total score 30 30        Immunization History  Administered Date(s) Administered  . Influenza Split 02/04/2012  . Influenza, High Dose Seasonal PF 12/19/2015  . Influenza,inj,Quad PF,36+ Mos 01/22/2013, 01/19/2014, 12/29/2014  . Pneumococcal Conjugate-13 02/06/2010, 06/11/2013   Screening Tests Health Maintenance  Topic Date Due  . TETANUS/TDAP  09/24/1963  . ZOSTAVAX  09/23/2004  . PNA vac Low Risk Adult (2 of 2 - PPSV23) 06/12/2014  . FOOT EXAM  02/08/2016  . HEMOGLOBIN A1C  08/20/2016  . OPHTHALMOLOGY EXAM  01/17/2017  . MAMMOGRAM  01/30/2017  . COLONOSCOPY  11/28/2021  . INFLUENZA VACCINE  Completed  . DEXA SCAN  Completed  . Hepatitis C Screening  Completed      Plan:    End of life planning; Advance aging; Advanced directives discussed. Copy of current HCPOA/Living Will requested.  Medicare Attestation I have personally reviewed: The patient's medical and social history Their use of alcohol, tobacco or illicit drugs Their current medications and supplements The patient's functional ability including ADLs,fall risks, home safety risks, cognitive, and hearing and visual impairment Diet and physical activities Evidence for depression    The patient's weight, height, BMI, and visual acuity have been recorded in the chart.  I have made referrals and provided education to the patient based on review of the above and I have provided the patient with a written personalized care plan for preventive services.    During the course of the visit the patient was educated and counseled about the following appropriate screening and preventive services:   Vaccines to include Pneumoccal, Influenza, Hepatitis B, Td, Zostavax, HCV  Electrocardiogram  Cardiovascular Disease  Colorectal cancer screening  Bone density screening  Diabetes screening  Glaucoma screening  Mammography/PAP  Nutrition counseling   Patient Instructions (the written plan) was given to the patient.   Ashok Pall, LPN  19/14/7829   Reviewed above information.  Agree with plan.  Dr Lorin Picket

## 2016-04-05 NOTE — Patient Instructions (Addendum)
  Beth Tyler , Thank you for taking time to come for your Medicare Wellness Visit. I appreciate your ongoing commitment to your health goals. Please review the following plan we discussed and let me know if I can assist you in the future.   Follow up with Dr. Lorin PicketScott as needed.  Happy New Year!!  These are the goals we discussed: Goals    . Healthy lifestyle          Stay hydrated and drink plenty of water. Low carb foods. Lean meats, vegetables. Stay active and continue exercise regimen.       This is a list of the screening recommended for you and due dates:  Health Maintenance  Topic Date Due  . Tetanus Vaccine  09/24/1963  . Shingles Vaccine  09/23/2004  . Pneumonia vaccines (2 of 2 - PPSV23) 06/12/2014  . Complete foot exam   02/08/2016  . Hemoglobin A1C  08/20/2016  . Eye exam for diabetics  01/17/2017  . Mammogram  01/30/2017  . Colon Cancer Screening  11/28/2021  . Flu Shot  Completed  . DEXA scan (bone density measurement)  Completed  .  Hepatitis C: One time screening is recommended by Center for Disease Control  (CDC) for  adults born from [birth year] through 1965.   Completed

## 2016-04-19 ENCOUNTER — Ambulatory Visit (INDEPENDENT_AMBULATORY_CARE_PROVIDER_SITE_OTHER): Payer: Medicare HMO | Admitting: Internal Medicine

## 2016-04-19 ENCOUNTER — Encounter: Payer: Self-pay | Admitting: Internal Medicine

## 2016-04-19 VITALS — BP 134/70 | HR 61 | Temp 97.5°F | Ht 62.0 in | Wt 243.2 lb

## 2016-04-19 DIAGNOSIS — Z794 Long term (current) use of insulin: Secondary | ICD-10-CM

## 2016-04-19 DIAGNOSIS — K589 Irritable bowel syndrome without diarrhea: Secondary | ICD-10-CM | POA: Diagnosis not present

## 2016-04-19 DIAGNOSIS — R69 Illness, unspecified: Secondary | ICD-10-CM | POA: Diagnosis not present

## 2016-04-19 DIAGNOSIS — F439 Reaction to severe stress, unspecified: Secondary | ICD-10-CM | POA: Diagnosis not present

## 2016-04-19 DIAGNOSIS — E1140 Type 2 diabetes mellitus with diabetic neuropathy, unspecified: Secondary | ICD-10-CM

## 2016-04-19 DIAGNOSIS — I10 Essential (primary) hypertension: Secondary | ICD-10-CM

## 2016-04-19 DIAGNOSIS — R600 Localized edema: Secondary | ICD-10-CM

## 2016-04-19 DIAGNOSIS — E538 Deficiency of other specified B group vitamins: Secondary | ICD-10-CM

## 2016-04-19 DIAGNOSIS — E785 Hyperlipidemia, unspecified: Secondary | ICD-10-CM

## 2016-04-19 MED ORDER — CYANOCOBALAMIN 1000 MCG/ML IJ SOLN
1000.0000 ug | Freq: Once | INTRAMUSCULAR | Status: AC
  Administered 2016-04-19: 1000 ug via INTRAMUSCULAR

## 2016-04-19 NOTE — Progress Notes (Signed)
Patient ID: Beth Tyler, female   DOB: 04/26/1945, 71 y.o.   MRN: [id number]   Subjective:    Patient ID: Beth Tyler, female    DOB: [birth year], 71 y.o.   MRN: [id number]  HPI  Patient here for a scheduled follow up.  Increased stress with recent death of her husband and son.  Increased over the holidays.  Overall she feels she is handling things relatively well.  Does not feel needs anything more at this time.  Has adjusted her diet some.  Has adjusted her insulin.  Sugars have improved some.  Attached readings reviewed.  No chest pain.  No sob.  No acid reflux.  Bowels stable.  Lower extremity swelling is better.     Past Medical History:  Diagnosis Date  . Anemia   . Arthritis   . B12 deficiency   . Diabetes mellitus ([place]) 02/17/2012  . Diabetes mellitus, type II ([place])   . Gastritis   . GERD (gastroesophageal reflux disease)   . Hypercholesterolemia   . Hypertension   . IBS (irritable bowel syndrome)   . Pernicious anemia    Past Surgical History:  Procedure Laterality Date  . BREAST BIOPSY Right 1980   neg  . BREAST SURGERY  1980   benign cyst removal  . CHOLECYSTECTOMY  1983   Family History  Problem Relation Age of Onset  . Diabetes Father   . Kidney disease Father   . Heart disease Father   . Alcohol abuse Mother   . Dementia Mother   . Diabetes Mother   . Diabetes Other   . Heart disease Son 63    CABG  . Hemophilia Brother   . Uterine cancer Sister   . Diabetes Sister   . COPD Sister   . Breast cancer Maternal Aunt 51  . Breast cancer Daughter 95   Social History   Social History  . Marital status: Married    Spouse name: N/A  . Number of children: 4  . Years of education: N/A   Social History Main Topics  . Smoking status: Never Smoker  . Smokeless tobacco: Never Used  . Alcohol use No  . Drug use: No  . Sexual activity: Not Currently   Other Topics Concern  . None   Social History Narrative   Lives with her husband. Has four  children. Manager of an apartment building. No tobacco, alcohol or other drug use    Outpatient Encounter Prescriptions as of 04/19/2016  Medication Sig  . amLODipine (NORVASC) 10 MG tablet TAKE ONE TABLET BY MOUTH ONCE DAILY  . aspirin 81 MG tablet Take 81 mg by mouth daily.  Marland Kitchen atenolol (TENORMIN) 100 MG tablet TAKE ONE TABLET BY MOUTH ONCE DAILY IN THE MORNING AND ONE-HALF TABLET IN THE EVENING (Patient taking differently: TAKE ONE TABLET BY MOUTH ONE-HALF  DAILY IN THE MORNING AND ONE TABLET IN THE EVENING)  . cyanocobalamin (,VITAMIN B-12,) 1000 MCG/ML injection Inject 1,000 mcg into the muscle every 30 (thirty) days.  . ferrous fumarate (HEMOCYTE - 106 MG FE) 325 (106 FE) MG TABS tablet Take 1 tablet by mouth.  . fluticasone (CUTIVATE) 0.05 % cream USE AS DIRECTED  . glipiZIDE (GLUCOTROL XL) 5 MG 24 hr tablet TAKE ONE TABLET BY MOUTH ONCE DAILY WITH BREAKFAST  . hydrochlorothiazide (HYDRODIURIL) 25 MG tablet TAKE ONE TABLET BY MOUTH ONCE DAILY  . Insulin Detemir (LEVEMIR) 100 UNIT/ML Pen Inject 24 units in the morning & 12 units at bedtime  .  Insulin Pen Needle 32G X 8 MM MISC Use as directed three times daily Dx E11.9  . lisinopril (PRINIVIL,ZESTRIL) 20 MG tablet TAKE ONE TABLET BY MOUTH ONCE DAILY IN THE MORNING AND TWO AT BEDTIME  . magnesium oxide (MAG-OX) 400 (241.3 Mg) MG tablet TAKE ONE TABLET BY MOUTH ONCE DAILY  . metFORMIN (GLUCOPHAGE) 1000 MG tablet TAKE ONE TABLET BY MOUTH TWICE DAILY WITH MEALS  . omeprazole (PRILOSEC) 20 MG capsule TAKE ONE CAPSULE BY MOUTH TWICE DAILY  . pioglitazone (ACTOS) 30 MG tablet TAKE ONE TABLET BY MOUTH ONCE DAILY  . simvastatin (ZOCOR) 10 MG tablet TAKE ONE TABLET BY MOUTH AT BEDTIME  . triamcinolone cream (KENALOG) 0.1 % APPLY 1 APPLICATION TOPICALLY 2 TIMES A DAY  . [EXPIRED] cyanocobalamin ((VITAMIN B-12)) injection 1,000 mcg    No facility-administered encounter medications on file as of 04/19/2016.     Review of Systems  Constitutional:  Negative for appetite change and unexpected weight change.  HENT: Negative for congestion and sinus pressure.   Respiratory: Negative for cough, chest tightness and shortness of breath.   Cardiovascular: Negative for chest pain and palpitations.       Lower extremity swelling is better.   Gastrointestinal: Negative for nausea and vomiting.       Bowels stable.   Genitourinary: Negative for difficulty urinating and dysuria.  Musculoskeletal: Negative for back pain and joint swelling.  Skin: Negative for color change.  Neurological: Negative for dizziness, light-headedness and headaches.  Psychiatric/Behavioral: Negative for agitation and dysphoric mood.       Objective:    Physical Exam  Constitutional: She appears well-developed and well-nourished. No distress.  HENT:  Nose: Nose normal.  Mouth/Throat: Oropharynx is clear and moist.  Neck: Neck supple. No thyromegaly present.  Cardiovascular: Normal rate and regular rhythm.   Pulmonary/Chest: Breath sounds normal. No respiratory distress. She has no wheezes.  Abdominal: Soft. Bowel sounds are normal. There is no tenderness.  Musculoskeletal: She exhibits no tenderness.  Swelling better.    Lymphadenopathy:    She has no cervical adenopathy.  Skin:  Skin changes lower extremity - better.    Psychiatric: She has a normal mood and affect. Her behavior is normal.    BP 134/70   Pulse 61   Temp 97.5 F (36.4 C) (Oral)   Ht 5' 2"  (1.575 m)   Wt 243 lb 3.2 oz (110.3 kg)   SpO2 96%   BMI 44.48 kg/m  Wt Readings from Last 3 Encounters:  04/19/16 243 lb 3.2 oz (110.3 kg)  04/05/16 242 lb (109.8 kg)  03/11/16 238 lb (108 kg)     Lab Results  Component Value Date   WBC 9.1 09/19/2015   HGB 12.7 09/19/2015   HCT 38.9 09/19/2015   PLT 239.0 09/19/2015   GLUCOSE 147 (H) 02/21/2016   CHOL 133 02/21/2016   TRIG 111.0 02/21/2016   HDL 55.90 02/21/2016   LDLDIRECT 74.4 02/21/2012   LDLCALC 55 02/21/2016   ALT 19 02/21/2016    AST 22 02/21/2016   NA 143 02/21/2016   K 4.7 02/21/2016   CL 104 02/21/2016   CREATININE 0.74 02/21/2016   BUN 17 02/21/2016   CO2 30 02/21/2016   TSH 2.21 09/19/2015   HGBA1C 7.8 (H) 02/21/2016   MICROALBUR 14.1 (H) 02/21/2016    Mm Digital Screening Bilateral  Result Date: 01/31/2016 CLINICAL DATA:  Screening. EXAM: DIGITAL SCREENING BILATERAL MAMMOGRAM WITH CAD COMPARISON:  Previous exam(s). ACR Breast Density Category b: There are  scattered areas of fibroglandular density. FINDINGS: There are no findings suspicious for malignancy. Images were processed with CAD. IMPRESSION: No mammographic evidence of malignancy. A result letter of this screening mammogram will be mailed directly to the patient. RECOMMENDATION: Screening mammogram in one year. (Code:SM-B-01Y) BI-RADS CATEGORY  1: Negative. Electronically Signed   By: Curlene Dolphin M.D.   On: 01/31/2016 17:08       Assessment & Plan:   Problem List Items Addressed This Visit    B12 deficiency    Continue B12 injections.        Bilateral leg edema    Saw vascular surgery.  Swelling better.  Skin better.  Follow.        Diabetes mellitus ([place])    Low carb diet and exercise.  Sugars have improved.  Hold on changin insulin.  Follow met b and a1c.       Relevant Orders   Hemoglobin A1c   Hyperlipidemia    Low cholesterol diet and exercise.  On simvastatin.  Follow lipid panel and liver function tests.        Relevant Orders   Hepatic function panel   Lipid panel   Hypertension    Blood pressure on recheck improved.  Continue same medication regimen.  Follow pressures.  Follow metabolic panel.        Relevant Orders   Basic metabolic panel   IBS (irritable bowel syndrome)    Bowels stable.  Desires no further intervention.  Follow.       Stress    Increased stress as outlined.  Discussed with her today.  Does not feel needs anything more at this time.  Follow.         Other Visit Diagnoses    Vitamin B 12  deficiency    -  Primary   Relevant Medications   cyanocobalamin ((VITAMIN B-12)) injection 1,000 mcg (Completed)       Einar Pheasant, MD

## 2016-04-19 NOTE — Progress Notes (Signed)
Pre visit review using our clinic review tool, if applicable. No additional management support is needed unless otherwise documented below in the visit note. 

## 2016-04-21 ENCOUNTER — Encounter: Payer: Self-pay | Admitting: Internal Medicine

## 2016-04-21 NOTE — Assessment & Plan Note (Signed)
Increased stress as outlined.  Discussed with her today.  Does not feel needs anything more at this time.  Follow.   

## 2016-04-21 NOTE — Assessment & Plan Note (Signed)
Saw vascular surgery.  Swelling better.  Skin better.  Follow.

## 2016-04-21 NOTE — Assessment & Plan Note (Addendum)
Low carb diet and exercise.  Sugars have improved.  Hold on changin insulin.  Follow met b and a1c.

## 2016-04-21 NOTE — Assessment & Plan Note (Signed)
Blood pressure on recheck improved.  Continue same medication regimen.  Follow pressures.  Follow metabolic panel.   

## 2016-04-21 NOTE — Assessment & Plan Note (Signed)
Low cholesterol diet and exercise.  On simvastatin.  Follow lipid panel and liver function tests.   

## 2016-04-21 NOTE — Assessment & Plan Note (Signed)
Bowels stable.  Desires no further intervention.  Follow.

## 2016-04-21 NOTE — Assessment & Plan Note (Signed)
Continue B12 injections.   

## 2016-04-23 ENCOUNTER — Other Ambulatory Visit: Payer: Self-pay | Admitting: Internal Medicine

## 2016-04-30 ENCOUNTER — Other Ambulatory Visit: Payer: Self-pay | Admitting: Internal Medicine

## 2016-05-06 ENCOUNTER — Ambulatory Visit
Admission: EM | Admit: 2016-05-06 | Discharge: 2016-05-06 | Disposition: A | Discharge status: Home / Self Care | Payer: Medicare HMO | Attending: Family Medicine | Admitting: Family Medicine

## 2016-05-06 ENCOUNTER — Ambulatory Visit (INDEPENDENT_AMBULATORY_CARE_PROVIDER_SITE_OTHER): Payer: Medicare HMO

## 2016-05-06 DIAGNOSIS — W19XXXA Unspecified fall, initial encounter: Secondary | ICD-10-CM

## 2016-05-06 DIAGNOSIS — M1711 Unilateral primary osteoarthritis, right knee: Secondary | ICD-10-CM | POA: Diagnosis not present

## 2016-05-06 DIAGNOSIS — Y92099 Unspecified place in other non-institutional residence as the place of occurrence of the external cause: Secondary | ICD-10-CM

## 2016-05-06 DIAGNOSIS — M25461 Effusion, right knee: Secondary | ICD-10-CM | POA: Diagnosis not present

## 2016-05-06 DIAGNOSIS — Y92009 Unspecified place in unspecified non-institutional (private) residence as the place of occurrence of the external cause: Principal | ICD-10-CM

## 2016-05-06 NOTE — ED Triage Notes (Signed)
Pt was walking down the hallway this morning and tripped and fell hurting her right knee. She is unable to get up on her own so she called rescue to help her out of the floor and had someone drive her over here. She is able to walk however with minimal pressure. She using a roller walker.

## 2016-05-06 NOTE — ED Provider Notes (Signed)
CSN: [id number]     Arrival date & time 05/06/16  1230 History   First MD Initiated Contact with Patient 05/06/16 1540     Chief Complaint  Patient presents with  . Fall   (Consider location/radiation/quality/duration/timing/severity/associated sxs/prior Treatment) HPI   This is a 71 year old female who at home was walking down the hallway when she tripped and fell on her right knee. She states that she was unable to get up on her own she called rescue for standing assistance from floor and has someone else drive her here to our facility. Is able to walk however is limping . She has been using a rolling walker since the accident. She does use a walker for long distance ambulation but not around the house. Denies syncope dizziness or hitting her head. Patient has known tricompartmental osteoarthritis and has decided not to have any injections or surgery performed.      Past Medical History:  Diagnosis Date  . Anemia   . Arthritis   . B12 deficiency   . Diabetes mellitus ([facility]) 02/17/2012  . Diabetes mellitus, type II ([facility])   . Gastritis   . GERD (gastroesophageal reflux disease)   . Hypercholesterolemia   . Hypertension   . IBS (irritable bowel syndrome)   . Pernicious anemia    Past Surgical History:  Procedure Laterality Date  . BREAST BIOPSY Right 1980   neg  . BREAST SURGERY  1980   benign cyst removal  . CHOLECYSTECTOMY  1983   Family History  Problem Relation Age of Onset  . Diabetes Father   . Kidney disease Father   . Heart disease Father   . Alcohol abuse Mother   . Dementia Mother   . Diabetes Mother   . Diabetes Other   . Heart disease Son 56    CABG  . Hemophilia Brother   . Uterine cancer Sister   . Diabetes Sister   . COPD Sister   . Breast cancer Maternal Aunt 51  . Breast cancer Daughter 7   Social History  Substance Use Topics  . Smoking status: Never Smoker  . Smokeless tobacco: Never Used  . Alcohol use No   OB History    No data  available     Review of Systems  Constitutional: Positive for activity change. Negative for chills, fatigue and fever.  Musculoskeletal: Positive for arthralgias and gait problem.  All other systems reviewed and are negative.   Allergies  Hydrocodone-acetaminophen; Percocet [oxycodone-acetaminophen]; Penicillins; and Tetracyclines & related  Home Medications   Prior to Admission medications   Medication Sig Start Date End Date Taking? Authorizing Provider  amLODipine (NORVASC) 10 MG tablet TAKE ONE TABLET BY MOUTH ONCE DAILY 04/23/16  Yes Dale [place], MD  aspirin 81 MG tablet Take 81 mg by mouth daily.   Yes Historical Provider, MD  atenolol (TENORMIN) 100 MG tablet TAKE 1 TABLET BY MOUTH ONCE DAILY IN THE MORNING AND ONE-HALF TABLET IN THE EVENING 04/30/16  Yes Dale [place]ettler, MD  cyanocobalamin (,VITAMIN B-12,) 1000 MCG/ML injection Inject 1,000 mcg into the muscle every 30 (thirty) days.   Yes Historical Provider, MD  ferrous fumarate (HEMOCYTE - 106 MG FE) 325 (106 FE) MG TABS tablet Take 1 tablet by mouth.   Yes Historical Provider, MD  fluticasone (CUTIVATE) 0.05 % cream USE AS DIRECTED 12/14/15  Yes Dale [place], MD  glipiZIDE (GLUCOTROL XL) 5 MG 24 hr tablet TAKE ONE TABLET BY MOUTH ONCE DAILY WITH BREAKFAST 01/08/16  Yes Charlene  Lorin Picket, MD  hydrochlorothiazide (HYDRODIURIL) 25 MG tablet TAKE ONE TABLET BY MOUTH ONCE DAILY 04/30/16  Yes Dale [place], MD  Insulin Detemir (LEVEMIR) 100 UNIT/ML Pen Inject 24 units in the morning & 12 units at bedtime 04/05/16  Yes Dale [place], MD  Insulin Pen Needle 32G X 8 MM MISC Use as directed three times daily Dx E11.9 09/14/14  Yes Radhika P Phadke, MD  lisinopril (PRINIVIL,ZESTRIL) 20 MG tablet TAKE ONE TABLET BY MOUTH ONCE DAILY IN THE MORNING AND TWO AT BEDTIME 03/29/16  Yes Dale [place], MD  magnesium oxide (MAG-OX) 400 (241.3 Mg) MG tablet TAKE ONE TABLET BY MOUTH ONCE DAILY 01/08/16  Yes Dale [place], MD  metFORMIN (GLUCOPHAGE) 1000  MG tablet TAKE ONE TABLET BY MOUTH TWICE DAILY WITH MEALS 01/16/16  Yes Dale [place], MD  omeprazole (PRILOSEC) 20 MG capsule TAKE ONE CAPSULE BY MOUTH TWICE DAILY 04/23/16  Yes Dale [place], MD  pioglitazone (ACTOS) 30 MG tablet TAKE ONE TABLET BY MOUTH ONCE DAILY 07/24/15  Yes Dale [place], MD  simvastatin (ZOCOR) 10 MG tablet TAKE ONE TABLET BY MOUTH AT BEDTIME 03/08/16  Yes Dale [place], MD  triamcinolone cream (KENALOG) 0.1 % APPLY 1 APPLICATION TOPICALLY 2 TIMES A DAY 03/02/16  Yes Dale [place], MD   Meds Ordered and Administered this Visit  Medications - No data to display  BP (!) 146/50 (BP Location: Left Arm)   Pulse (!) 59   Temp 99.1 F (37.3 C) (Oral)   Resp 18   Ht 5\' 2"  (1.575 m)   Wt 243 lb (110.2 kg)   SpO2 98%   BMI 44.45 kg/m  No data found.   Physical Exam  Constitutional: She appears well-developed and well-nourished. No distress.  HENT:  Head: Normocephalic and atraumatic.  Eyes: EOM are normal. Pupils are equal, round, and reactive to light. Right eye exhibits no discharge. Left eye exhibits no discharge.  Neck: Normal range of motion. Neck supple.  Musculoskeletal:  Examination right knee shows some mild swelling present. Ecchymosis over the proximal lateral tibia. Also noticeable is peripheral edema or venous stasis changes for which she is being cared for with vascular surgery. She has a good range of motion of the knee in the seated position. Tenderness to patellar compression but this is likely from her tricompartmental OA.  Skin: She is not diaphoretic.  Nursing note and vitals reviewed.   Urgent Care Course     Procedures (including critical care time)  Labs Review Labs Reviewed - No data to display  Imaging Review Dg Knee Complete 4 Views Right  Result Date: 05/06/2016 CLINICAL DATA:  Right knee injury due to a fall today. Pain. Initial encounter. EXAM: RIGHT KNEE - COMPLETE 4+ VIEW COMPARISON:  None. FINDINGS: No acute bony or joint  abnormality is identified. Severe degenerative change is present diffusely about the knee and appears worst in the medial and patellofemoral compartments. Small joint effusion is noted. IMPRESSION: No acute abnormality. Severe tricompartmental osteoarthritis. Small joint effusion. Electronically Signed   By: Drusilla Kanner M.D.   On: 05/06/2016 15:24     Visual Acuity Review  Right Eye Distance:   Left Eye Distance:   Bilateral Distance:    Right Eye Near:   Left Eye Near:    Bilateral Near:      Was fitted with a knee immobilizer   MDM   1. Fall in home, initial encounter   2. Primary osteoarthritis of right knee    The patient will apply ice  to the area 3 times daily for 20 minutes at a time. I've asked her to do range of motion exercises illness in a seated position several times daily. She will follow-up with her primary care physician if there is any problems. Did provide her with a knee brace for better stabilization when she is ambulating.    Lutricia FeilWilliam P Roemer, PA-C 05/06/16 [phone]

## 2016-05-10 ENCOUNTER — Encounter: Payer: Self-pay | Admitting: Internal Medicine

## 2016-05-10 NOTE — Telephone Encounter (Signed)
Would continue tylenol.  If she is having this much pain, I would recommend a reevaluation today.

## 2016-05-13 NOTE — Telephone Encounter (Signed)
Called patient she is feeling a little bit better. She would like to hold off on the evaluation. She states she has app with [facility]. She will call if she has any questions or new symptoms.

## 2016-05-14 ENCOUNTER — Ambulatory Visit (INDEPENDENT_AMBULATORY_CARE_PROVIDER_SITE_OTHER): Payer: Medicare HMO | Admitting: Vascular Surgery

## 2016-05-21 ENCOUNTER — Ambulatory Visit (INDEPENDENT_AMBULATORY_CARE_PROVIDER_SITE_OTHER): Payer: Medicare HMO | Admitting: Vascular Surgery

## 2016-05-21 ENCOUNTER — Encounter (INDEPENDENT_AMBULATORY_CARE_PROVIDER_SITE_OTHER): Payer: Self-pay | Admitting: Vascular Surgery

## 2016-05-21 VITALS — BP 178/73 | HR 67 | Resp 16 | Ht 62.0 in | Wt 235.0 lb

## 2016-05-21 DIAGNOSIS — M79605 Pain in left leg: Secondary | ICD-10-CM | POA: Diagnosis not present

## 2016-05-21 DIAGNOSIS — E1140 Type 2 diabetes mellitus with diabetic neuropathy, unspecified: Secondary | ICD-10-CM

## 2016-05-21 DIAGNOSIS — R600 Localized edema: Secondary | ICD-10-CM

## 2016-05-21 DIAGNOSIS — M79604 Pain in right leg: Secondary | ICD-10-CM

## 2016-05-21 DIAGNOSIS — Z794 Long term (current) use of insulin: Secondary | ICD-10-CM

## 2016-05-21 DIAGNOSIS — I10 Essential (primary) hypertension: Secondary | ICD-10-CM | POA: Diagnosis not present

## 2016-05-21 DIAGNOSIS — I890 Lymphedema, not elsewhere classified: Secondary | ICD-10-CM

## 2016-05-21 NOTE — Assessment & Plan Note (Signed)
Her leg swelling was doing pretty well until she had this recent fall. She had noticed significant improvement in the left leg does look better. Once she has recovered from her injury, continue all the previous therapies. Compression and elevation of her legs is significant importance. Return to clinic in 3-4 months

## 2016-05-21 NOTE — Progress Notes (Signed)
MRN : [id number]  Beth Tyler is a 71 y.o. (1945-11-22) female who presents with chief complaint of  Chief Complaint  Patient presents with  . Re-evaluation    2 month follow up no studies  .  History of Present Illness: Patient returns today in follow up of leg swelling.  Her legs are doing quite a bit better until she had a significant fall and injury to her right leg about 2-3 weeks ago. This remains bruised and swollen. Her right leg swelling has significantly worsened since this episode. This is hampered her mobility and has been quite painful. She did not develop ulceration or an open wound. She has no fever or chills or signs of infection.       Past Medical History:  Diagnosis Date  . Anemia   . Arthritis   . B12 deficiency   . Diabetes mellitus (HCC) 02/17/2012  . Diabetes mellitus, type II (HCC)   . Gastritis   . GERD (gastroesophageal reflux disease)   . Hypercholesterolemia   . Hypertension   . IBS (irritable bowel syndrome)   . Pernicious anemia          Past Surgical History:  Procedure Laterality Date  . BREAST BIOPSY Right 1980   neg  . BREAST SURGERY  1980   benign cyst removal  . CHOLECYSTECTOMY  1983          Family History  Problem Relation Age of Onset  . Diabetes Father   . Kidney disease Father   . Heart disease Father   . Alcohol abuse Mother   . Dementia Mother   . Diabetes Mother   . Diabetes Other   . Heart disease Son 16    CABG  . Hemophilia Brother   . Uterine cancer Sister   . Diabetes Sister   . COPD Sister   . Breast cancer Maternal Aunt 51  . Breast cancer Daughter 37     Social History     Social History  Substance Use Topics  . Smoking status: Never Smoker  . Smokeless tobacco: Never Used  . Alcohol use No  No IV drug use      Allergies  Allergen Reactions  . Hydrocodone-Acetaminophen Anaphylaxis  . Percocet [Oxycodone-Acetaminophen] Anaphylaxis  . Penicillins  Nausea Only  . Tetracyclines & Related Nausea Only          Current Outpatient Prescriptions  Medication Sig Dispense Refill  . amLODipine (NORVASC) 10 MG tablet TAKE ONE TABLET BY MOUTH ONCE DAILY 90 tablet 1  . aspirin 81 MG tablet Take 81 mg by mouth daily.    Marland Kitchen atenolol (TENORMIN) 100 MG tablet TAKE ONE TABLET BY MOUTH ONCE DAILY IN THE MORNING AND ONE-HALF TABLET IN THE EVENING (Patient taking differently: TAKE ONE TABLET BY MOUTH ONE-HALF  DAILY IN THE MORNING AND ONE TABLET IN THE EVENING) 135 tablet 3  . cyanocobalamin (,VITAMIN B-12,) 1000 MCG/ML injection Inject 1,000 mcg into the muscle every 30 (thirty) days.    . ferrous fumarate (HEMOCYTE - 106 MG FE) 325 (106 FE) MG TABS tablet Take 1 tablet by mouth.    . fluticasone (CUTIVATE) 0.05 % cream USE AS DIRECTED 30 g 0  . glipiZIDE (GLUCOTROL XL) 5 MG 24 hr tablet TAKE ONE TABLET BY MOUTH ONCE DAILY WITH BREAKFAST 90 tablet 3  . hydrochlorothiazide (HYDRODIURIL) 25 MG tablet TAKE ONE TABLET BY MOUTH ONCE DAILY 90 tablet 3  . Insulin Detemir (LEVEMIR) 100 UNIT/ML Pen Inject  22 units in the morning & 12 units at bedtime 15 mL 11  . Insulin Pen Needle 32G X 8 MM MISC Use as directed three times daily Dx E11.9 300 each 1  . lisinopril (PRINIVIL,ZESTRIL) 20 MG tablet TAKE ONE TABLET IN THE MORNING AND TWO AT BEDTIME 90 tablet 3  . magnesium oxide (MAG-OX) 400 (241.3 Mg) MG tablet TAKE ONE TABLET BY MOUTH ONCE DAILY 90 tablet 3  . metFORMIN (GLUCOPHAGE) 1000 MG tablet TAKE ONE TABLET BY MOUTH TWICE DAILY WITH MEALS 180 tablet 1  . omeprazole (PRILOSEC) 20 MG capsule TAKE ONE CAPSULE BY MOUTH TWICE DAILY 180 capsule 0  . pioglitazone (ACTOS) 30 MG tablet TAKE ONE TABLET BY MOUTH ONCE DAILY 90 tablet 3  . simvastatin (ZOCOR) 10 MG tablet TAKE ONE TABLET BY MOUTH AT BEDTIME 90 tablet 2  . triamcinolone cream (KENALOG) 0.1 % Apply 1 application topically 2 (two) times daily. 45 g 0   No current facility-administered medications for  this visit.       REVIEW OF SYSTEMS (Negative unless checked)  Constitutional: [] Weight loss  [] Fever  [] Chills Cardiac: [] Chest pain   [] Chest pressure   [] Palpitations   [] Shortness of breath when laying flat   [] Shortness of breath at rest   [] Shortness of breath with exertion. Vascular:  [x] Pain in legs with walking   [] Pain in legs at rest   [] Pain in legs when laying flat   [] Claudication   [] Pain in feet when walking  [] Pain in feet at rest  [] Pain in feet when laying flat   [] History of DVT   [] Phlebitis   [x] Swelling in legs   [x] Varicose veins   [] Non-healing ulcers Pulmonary:   [] Uses home oxygen   [] Productive cough   [] Hemoptysis   [] Wheeze  [] COPD   [] Asthma Neurologic:  [] Dizziness  [] Blackouts   [] Seizures   [] History of stroke   [] History of TIA  [] Aphasia   [] Temporary blindness   [] Dysphagia   [] Weakness or numbness in arms   [] Weakness or numbness in legs Musculoskeletal:  [] Arthritis   [] Joint swelling   [] Joint pain   [] Low back pain Hematologic:  [] Easy bruising  [] Easy bleeding   [] Hypercoagulable state   [] Anemic  [] Hepatitis Gastrointestinal:  [] Blood in stool   [] Vomiting blood  [] Gastroesophageal reflux/heartburn   [] Abdominal pain Genitourinary:  [] Chronic kidney disease   [] Difficult urination  [] Frequent urination  [] Burning with urination   [] Hematuria Skin:  [] Rashes   [] Ulcers   [] Wounds Psychological:  [] History of anxiety   []  History of major depression.    Physical Exam BP (!) 150/65 (BP Location: Left Arm)   Pulse 60   Resp 16   Ht 5\' 2"  (1.575 m)   Wt 110.7 kg (244 lb)   BMI 44.63 kg/m  Gen:  WD/WN, NAD Head: [place]/AT, No temporalis wasting.  Ear/Nose/Throat: Hearing grossly intact, Oropharynx clear Eyes: Sclera non-icteric. Conjunctiva clear Neck: Supple, no nuchal rigidity. Trachea midline Pulmonary:  Good air movement, no use of accessory muscles, respirations not labored.  Cardiac: RRR, No JVD Vascular: Varicosities scattered and  measuring up to 2-3 mm in the right lower extremity                   Varicosities scattered and measuring up to 2-3 mm in the left lower extremity Vessel Right Left  Radial Palpable Palpable  Ulnar Palpable Palpable  Brachial Palpable Palpable  Carotid Palpable, without bruit Palpable, without bruit  Aorta Not palpable N/A  Femoral Palpable Palpable  Popliteal Palpable Palpable  PT Not Palpable Trace Palpable  DP 1+ Palpable Palpable   Gastrointestinal: soft, non-tender/non-distended. No guarding/reflex. No masses, surgical incisions, or scars. Musculoskeletal: M/S 5/5 throughout.   2-3 + RLE edema.  1 + LLE edema. Significant stasis dermatitis with scaling skin present on the medial lower calf bilaterally worse on the right than the left. Walks with a walker Neurologic: Sensation grossly intact in extremities.  Symmetrical.  Speech is fluent.  Psychiatric: Judgment intact, Mood & affect appropriate for pt's clinical situation. Dermatologic: No rashes or ulcers noted.  No cellulitis or open wounds. Lymph : No Cervical, Axillary, or Inguinal lymphadenopathy.    Labs No results found for this or any previous visit (from the past 2160 hour(s)).  Radiology Dg Knee Complete 4 Views Right  Result Date: 05/06/2016 CLINICAL DATA:  Right knee injury due to a fall today. Pain. Initial encounter. EXAM: RIGHT KNEE - COMPLETE 4+ VIEW COMPARISON:  None. FINDINGS: No acute bony or joint abnormality is identified. Severe degenerative change is present diffusely about the knee and appears worst in the medial and patellofemoral compartments. Small joint effusion is noted. IMPRESSION: No acute abnormality. Severe tricompartmental osteoarthritis. Small joint effusion. Electronically Signed   By: Drusilla Kanner M.D.   On: 05/06/2016 15:24     Assessment/Plan  Hypertension blood pressure control important in reducing the progression of atherosclerotic disease. On appropriate oral  medications.   Diabetes mellitus blood glucose control important in reducing the progression of atherosclerotic disease. Also, involved in wound healing. On appropriate medications.   Pain in limb Worse after her recent fall. Continue conservative therapies as much as possible.  Lymphedema Her leg swelling was doing pretty well until she had this recent fall. She had noticed significant improvement in the left leg does look better. Once she has recovered from her injury, continue all the previous therapies. Compression and elevation of her legs is significant importance. Return to clinic in 3-4 months    Festus Barren, MD  05/21/2016 4:50 PM    This note was created with Dragon medical transcription system.  Any errors from dictation are purely unintentional

## 2016-05-21 NOTE — Assessment & Plan Note (Signed)
Worse after her recent fall. Continue conservative therapies as much as possible.

## 2016-05-27 ENCOUNTER — Other Ambulatory Visit: Payer: Self-pay | Admitting: Internal Medicine

## 2016-06-02 ENCOUNTER — Other Ambulatory Visit: Payer: Self-pay | Admitting: Internal Medicine

## 2016-06-03 ENCOUNTER — Telehealth: Payer: Self-pay

## 2016-06-03 NOTE — Telephone Encounter (Signed)
auth submitted or Omeprazole 20mg  90 day supply. Authorization received  effitcive from 04/17/16 to 03/20/2017 Copy sent to scan

## 2016-06-12 MED ORDER — OMEPRAZOLE 20 MG PO CPDR: 20.0000 mg | DELAYED_RELEASE_CAPSULE | Freq: Two times a day (BID) | ORAL | 0 refills | Status: DC

## 2016-06-13 MED ORDER — TRIAMCINOLONE ACETONIDE 0.1 % EX CREA: TOPICAL_CREAM | Freq: Two times a day (BID) | CUTANEOUS | 0 refills | Status: DC

## 2016-06-13 NOTE — Telephone Encounter (Signed)
ok'd rx for triamcinolone cream.

## 2016-06-14 ENCOUNTER — Other Ambulatory Visit: Payer: Self-pay

## 2016-06-19 ENCOUNTER — Other Ambulatory Visit: Payer: Self-pay

## 2016-06-19 ENCOUNTER — Other Ambulatory Visit (INDEPENDENT_AMBULATORY_CARE_PROVIDER_SITE_OTHER): Payer: Medicare HMO

## 2016-06-19 ENCOUNTER — Ambulatory Visit (INDEPENDENT_AMBULATORY_CARE_PROVIDER_SITE_OTHER): Payer: Medicare HMO | Admitting: *Deleted

## 2016-06-19 DIAGNOSIS — I10 Essential (primary) hypertension: Secondary | ICD-10-CM

## 2016-06-19 DIAGNOSIS — E785 Hyperlipidemia, unspecified: Secondary | ICD-10-CM

## 2016-06-19 DIAGNOSIS — E1140 Type 2 diabetes mellitus with diabetic neuropathy, unspecified: Secondary | ICD-10-CM | POA: Diagnosis not present

## 2016-06-19 DIAGNOSIS — Z794 Long term (current) use of insulin: Secondary | ICD-10-CM

## 2016-06-19 DIAGNOSIS — E538 Deficiency of other specified B group vitamins: Secondary | ICD-10-CM

## 2016-06-19 LAB — BASIC METABOLIC PANEL
BUN: 12 mg/dL (ref 6–23)
CO2: 31 mEq/L (ref 19–32)
Calcium: 9.6 mg/dL (ref 8.4–10.5)
Chloride: 102 mEq/L (ref 96–112)
Creatinine, Ser: 0.72 mg/dL (ref 0.40–1.20)
GFR: 84.69 mL/min (ref >60.00)
Glucose, Bld: 185 mg/dL — ABNORMAL HIGH (ref 70–99)
POTASSIUM: 4.8 meq/L (ref 3.5–5.1)
SODIUM: 142 meq/L (ref 135–145)

## 2016-06-19 LAB — LIPID PANEL
CHOL/HDL RATIO: 3
CHOLESTEROL: 159 mg/dL (ref 0–200)
HDL: 50 mg/dL (ref >39.00)
LDL CALC: 80 mg/dL (ref 0–99)
NonHDL: 108.73
Triglycerides: 145 mg/dL (ref 0.0–149.0)
VLDL: 29 mg/dL (ref 0.0–40.0)

## 2016-06-19 LAB — HEPATIC FUNCTION PANEL
ALT: 17 U/L (ref 0–35)
AST: 15 U/L (ref 0–37)
Albumin: 4.1 g/dL (ref 3.5–5.2)
Alkaline Phosphatase: 82 U/L (ref 39–117)
BILIRUBIN DIRECT: 0.1 mg/dL (ref 0.0–0.3)
BILIRUBIN TOTAL: 0.4 mg/dL (ref 0.2–1.2)
Total Protein: 6.8 g/dL (ref 6.0–8.3)

## 2016-06-19 LAB — HEMOGLOBIN A1C: HEMOGLOBIN A1C: 8.3 % — AB (ref 4.6–6.5)

## 2016-06-19 MED ORDER — CYANOCOBALAMIN 1000 MCG/ML IJ SOLN
1000.0000 ug | Freq: Once | INTRAMUSCULAR | Status: AC
  Administered 2016-06-19: 1000 ug via INTRAMUSCULAR

## 2016-06-19 NOTE — Progress Notes (Addendum)
Patient presented for B 12 injection to left deltoid , patient showed no signs of discomfort during injection.  Reviewed.  Dr Lorin PicketScott

## 2016-06-21 ENCOUNTER — Telehealth: Payer: Self-pay

## 2016-06-21 NOTE — Telephone Encounter (Signed)
Spoke to patient was just letting us know she received message.

## 2016-06-21 NOTE — Telephone Encounter (Signed)
Called pt phone d/c will call back later.

## 2016-06-21 NOTE — Telephone Encounter (Signed)
Please call pt at [phone]

## 2016-06-21 NOTE — Telephone Encounter (Signed)
Left message to return call to our office.  

## 2016-06-21 NOTE — Telephone Encounter (Signed)
Check and record sugars over the next two weeks and send in readings.

## 2016-06-21 NOTE — Telephone Encounter (Signed)
-----   Message from Beth Durhamharlene Scott, MD sent at 06/20/2016  4:46 AM EDT ----- Please call and notify pt that her cholesterol looks good.  Overall sugar control has increased more.  Given persistent increase, need to refer her to endocrinology for her diabetes.  If agreeable, let me know and I will place the order for the referral.  Kidney function tests and liver function tests are wnl.

## 2016-06-21 NOTE — Telephone Encounter (Signed)
Left message on v/m to let her know instructions

## 2016-06-21 NOTE — Telephone Encounter (Signed)
Patient would like to hold off on referral until follow up with you next month. She had a fall and was not able to prepare her own meals but is doing better now would like to talk to you about this at next o/v.

## 2016-06-21 NOTE — Telephone Encounter (Signed)
Pt called back returning your call. Thank you!  Call pt @ [phone]

## 2016-07-19 ENCOUNTER — Ambulatory Visit (INDEPENDENT_AMBULATORY_CARE_PROVIDER_SITE_OTHER): Payer: Medicare HMO | Admitting: Internal Medicine

## 2016-07-19 ENCOUNTER — Encounter: Payer: Self-pay | Admitting: Internal Medicine

## 2016-07-19 DIAGNOSIS — F439 Reaction to severe stress, unspecified: Secondary | ICD-10-CM | POA: Diagnosis not present

## 2016-07-19 DIAGNOSIS — E538 Deficiency of other specified B group vitamins: Secondary | ICD-10-CM | POA: Diagnosis not present

## 2016-07-19 DIAGNOSIS — I890 Lymphedema, not elsewhere classified: Secondary | ICD-10-CM

## 2016-07-19 DIAGNOSIS — K589 Irritable bowel syndrome without diarrhea: Secondary | ICD-10-CM

## 2016-07-19 DIAGNOSIS — I10 Essential (primary) hypertension: Secondary | ICD-10-CM | POA: Diagnosis not present

## 2016-07-19 DIAGNOSIS — E1140 Type 2 diabetes mellitus with diabetic neuropathy, unspecified: Secondary | ICD-10-CM | POA: Diagnosis not present

## 2016-07-19 DIAGNOSIS — Z794 Long term (current) use of insulin: Secondary | ICD-10-CM

## 2016-07-19 DIAGNOSIS — E785 Hyperlipidemia, unspecified: Secondary | ICD-10-CM

## 2016-07-19 DIAGNOSIS — R69 Illness, unspecified: Secondary | ICD-10-CM | POA: Diagnosis not present

## 2016-07-19 DIAGNOSIS — R600 Localized edema: Secondary | ICD-10-CM | POA: Diagnosis not present

## 2016-07-19 MED ORDER — AMLODIPINE BESYLATE 10 MG PO TABS: 10.0000 mg | ORAL_TABLET | Freq: Every day | ORAL | 1 refills | Status: DC

## 2016-07-19 MED ORDER — LISINOPRIL 20 MG PO TABS: ORAL_TABLET | ORAL | 1 refills | Status: DC

## 2016-07-19 MED ORDER — GLIPIZIDE ER 5 MG PO TB24: ORAL_TABLET | ORAL | 1 refills | Status: DC

## 2016-07-19 NOTE — Progress Notes (Signed)
Pre-visit discussion using our clinic review tool. No additional management support is needed unless otherwise documented below in the visit note.  

## 2016-07-19 NOTE — Progress Notes (Signed)
Patient ID: Beth Tyler, female   DOB: [birth year], 71 y.o.   MRN: [id number]   Subjective:    Patient ID: Beth Tyler, female    DOB: [birth year], 71 y.o.   MRN: [id number]  HPI  Patient here for a scheduled follow up.  Had a fall in 05/06/2016.  [place] and injured her right leg.  Significant bruising.  Xray revealed no acute abnormality.  Recommend ice.  She saw Dr Wyn Quaker in f/u on 05/21/16.  Her right leg swelling worsened since the fall.  Increased pain.  She was evaluated by vascular and recommended continuing compression hose and leg elevation.  Felt to be related to the injury and lymphedema.  She states the swelling is some better now.  Still with increased pain, but this has improved.  She is able to get around better.  Discussed other pain medication and evaluation.  She declines.  No chest pain.  Breathing stable.  No acid reflux.  No bowel change.  Since her recent labs, she has really been trying to do better watching her diet.  Sugar readings have started to improve.  Attached list reviewed.     Past Medical History:  Diagnosis Date  . Anemia   . Arthritis   . B12 deficiency   . Diabetes mellitus (HCC) 02/17/2012  . Diabetes mellitus, type II (HCC)   . Gastritis   . GERD (gastroesophageal reflux disease)   . Hypercholesterolemia   . Hypertension   . IBS (irritable bowel syndrome)   . Pernicious anemia    Past Surgical History:  Procedure Laterality Date  . BREAST BIOPSY Right 1980   neg  . BREAST SURGERY  1980   benign cyst removal  . CHOLECYSTECTOMY  1983   Family History  Problem Relation Age of Onset  . Diabetes Father   . Kidney disease Father   . Heart disease Father   . Alcohol abuse Mother   . Dementia Mother   . Diabetes Mother   . Diabetes Other   . Heart disease Son 27    CABG  . Hemophilia Brother   . Uterine cancer Sister   . Diabetes Sister   . COPD Sister   . Breast cancer Maternal Aunt 51  . Breast cancer Daughter 56   Social History    Social History  . Marital status: Married    Spouse name: N/A  . Number of children: 4  . Years of education: N/A   Social History Main Topics  . Smoking status: Never Smoker  . Smokeless tobacco: Never Used  . Alcohol use No  . Drug use: No  . Sexual activity: Not Currently   Other Topics Concern  . None   Social History Narrative   Lives with her husband. Has four children. Manager of an apartment building. No tobacco, alcohol or other drug use    Outpatient Encounter Prescriptions as of 07/19/2016  Medication Sig  . amLODipine (NORVASC) 10 MG tablet Take 1 tablet (10 mg total) by mouth daily.  Marland Kitchen aspirin 81 MG tablet Take 81 mg by mouth daily.  Marland Kitchen atenolol (TENORMIN) 100 MG tablet TAKE 1 TABLET BY MOUTH ONCE DAILY IN THE MORNING AND ONE-HALF TABLET IN THE EVENING  . cyanocobalamin (,VITAMIN B-12,) 1000 MCG/ML injection Inject 1,000 mcg into the muscle every 30 (thirty) days.  . ferrous fumarate (HEMOCYTE - 106 MG FE) 325 (106 FE) MG TABS tablet Take 1 tablet by mouth.  . fluticasone (CUTIVATE) 0.05 % cream USE  AS DIRECTED  . glipiZIDE (GLUCOTROL XL) 5 MG 24 hr tablet TAKE ONE TABLET BY MOUTH ONCE DAILY WITH BREAKFAST  . hydrochlorothiazide (HYDRODIURIL) 25 MG tablet TAKE ONE TABLET BY MOUTH ONCE DAILY  . Insulin Detemir (LEVEMIR) 100 UNIT/ML Pen Inject 24 units in the morning & 12 units at bedtime  . Insulin Pen Needle 32G X 8 MM MISC Use as directed three times daily Dx E11.9  . lisinopril (PRINIVIL,ZESTRIL) 20 MG tablet TAKE ONE TABLET BY MOUTH ONCE DAILY IN THE MORNING AND TWO AT BEDTIME  . magnesium oxide (MAG-OX) 400 (241.3 Mg) MG tablet TAKE ONE TABLET BY MOUTH ONCE DAILY  . metFORMIN (GLUCOPHAGE) 1000 MG tablet TAKE ONE TABLET BY MOUTH TWICE DAILY WITH MEALS  . omeprazole (PRILOSEC) 20 MG capsule Take 1 capsule (20 mg total) by mouth 2 (two) times daily.  . pioglitazone (ACTOS) 30 MG tablet TAKE ONE TABLET BY MOUTH ONCE DAILY  . simvastatin (ZOCOR) 10 MG tablet TAKE  ONE TABLET BY MOUTH AT BEDTIME  . triamcinolone cream (KENALOG) 0.1 % Apply topically 2 (two) times daily.  . [DISCONTINUED] amLODipine (NORVASC) 10 MG tablet TAKE ONE TABLET BY MOUTH ONCE DAILY  . [DISCONTINUED] glipiZIDE (GLUCOTROL XL) 5 MG 24 hr tablet TAKE ONE TABLET BY MOUTH ONCE DAILY WITH BREAKFAST  . [DISCONTINUED] lisinopril (PRINIVIL,ZESTRIL) 20 MG tablet TAKE ONE TABLET BY MOUTH ONCE DAILY IN THE MORNING AND TWO AT BEDTIME   No facility-administered encounter medications on file as of 07/19/2016.     Review of Systems  Constitutional: Negative for appetite change and unexpected weight change.  HENT: Negative for congestion and sinus pressure.   Respiratory: Negative for cough, chest tightness and shortness of breath.   Cardiovascular: Positive for leg swelling. Negative for chest pain and palpitations.  Gastrointestinal: Negative for nausea and vomiting.       No acute abdominal pain.  Bowels stable.    Genitourinary: Negative for difficulty urinating and dysuria.  Musculoskeletal: Positive for gait problem. Negative for myalgias.  Skin: Negative for rash.       Stasis changes lower extremities.   Neurological: Negative for dizziness, light-headedness and headaches.  Psychiatric/Behavioral: Negative for agitation and dysphoric mood.       Objective:    Physical Exam  Constitutional: She appears well-developed and well-nourished. No distress.  HENT:  Nose: Nose normal.  Mouth/Throat: Oropharynx is clear and moist.  Neck: Neck supple. No thyromegaly present.  Cardiovascular: Normal rate and regular rhythm.   Pulmonary/Chest: Breath sounds normal. No respiratory distress. She has no wheezes.  Abdominal: Soft. Bowel sounds are normal. There is no tenderness.  Musculoskeletal:  Lower extremity edema - right leg > left.  No evidence of infection.  Some stasis changes.   Lymphadenopathy:    She has no cervical adenopathy.  Skin: Skin is warm and dry.  Psychiatric: She has  a normal mood and affect. Her behavior is normal.    BP 134/72 (BP Location: Left Arm, Patient Position: Sitting, Cuff Size: Normal)   Pulse (!) 58   Temp 98.6 F (37 C) (Oral)   Resp 14   Ht  (1.575 m)   Wt 234 lb 12.8 oz (106.5 kg)   SpO2 98%   BMI 42.95 kg/m  Wt Readings from Last 3 Encounters:  07/19/16 234 lb 12.8 oz (106.5 kg)  05/21/16 235 lb (106.6 kg)  05/06/16 243 lb (110.2 kg)     Lab Results  Component Value Date   WBC 9.1 09/19/2015  HGB 12.7 09/19/2015   HCT 38.9 09/19/2015   PLT 239.0 09/19/2015   GLUCOSE 185 (H) 06/19/2016   CHOL 159 06/19/2016   TRIG 145.0 06/19/2016   HDL 50.00 06/19/2016   LDLDIRECT 74.4 02/21/2012   LDLCALC 80 06/19/2016   ALT 17 06/19/2016   AST 15 06/19/2016   NA 142 06/19/2016   K 4.8 06/19/2016   CL 102 06/19/2016   CREATININE 0.72 06/19/2016   BUN 12 06/19/2016   CO2 31 06/19/2016   TSH 2.21 09/19/2015   HGBA1C 8.3 (H) 06/19/2016   MICROALBUR 14.1 (H) 02/21/2016    Dg Knee Complete 4 Views Right  Result Date: 05/06/2016 CLINICAL DATA:  Right knee injury due to a fall today. Pain. Initial encounter. EXAM: RIGHT KNEE - COMPLETE 4+ VIEW COMPARISON:  None. FINDINGS: No acute bony or joint abnormality is identified. Severe degenerative change is present diffusely about the knee and appears worst in the medial and patellofemoral compartments. Small joint effusion is noted. IMPRESSION: No acute abnormality. Severe tricompartmental osteoarthritis. Small joint effusion. Electronically Signed   By: Drusilla Kanner M.D.   On: 05/06/2016 15:24       Assessment & Plan:   Problem List Items Addressed This Visit    B12 deficiency    Continue b12 injections.       Bilateral leg edema    Worsened recently with the fall.  Right leg > left.  Evaluated by vascular surgery.  Note reviewed.  Is better per pt.  Follow.       Diabetes mellitus (HCC)    Sugars have been poorly controlled.  Have tried adjusting her insulin.  She  recently has been more strict with her diet.  Sugars have improved recently.  Attached list reviewed.  Hold on making changes today, since diet adjustment has improved sugars.  Discussed with her today.  Continue diet.  Follow.  Discussed need for regular eye exams.        Relevant Medications   glipiZIDE (GLUCOTROL XL) 5 MG 24 hr tablet   lisinopril (PRINIVIL,ZESTRIL) 20 MG tablet   Other Relevant Orders   Hemoglobin A1c   Hyperlipidemia    On simvastatin.  Low cholesterol diet and exercise.  Follow lipid panel and liver function tests.        Relevant Medications   amLODipine (NORVASC) 10 MG tablet   lisinopril (PRINIVIL,ZESTRIL) 20 MG tablet   Other Relevant Orders   Hepatic function panel   Lipid panel   Hypertension    Blood pressure a little elevated recently.  On my recheck, slightly elevated (140 systolic).  Hold on making changes in her medications.  Have her to continue to spot check her pressure.  Notify me if persistent elevation.        Relevant Medications   amLODipine (NORVASC) 10 MG tablet   lisinopril (PRINIVIL,ZESTRIL) 20 MG tablet   Other Relevant Orders   TSH   CBC with Differential/Platelet   Basic metabolic panel   IBS (irritable bowel syndrome)    Bowels stable.        Lymphedema    See vascular surgery note.  Follow.        Severe obesity (BMI >= 40) (HCC)    Discussed diet and exercise.  Follow.       Relevant Medications   glipiZIDE (GLUCOTROL XL) 5 MG 24 hr tablet   Stress    She is handling things relatively well.  Does not feel needs any further intervention.  Follow.  Einar Pheasant, MD

## 2016-07-21 ENCOUNTER — Other Ambulatory Visit: Payer: Self-pay | Admitting: Internal Medicine

## 2016-07-21 ENCOUNTER — Encounter: Payer: Self-pay | Admitting: Internal Medicine

## 2016-07-21 NOTE — Assessment & Plan Note (Signed)
Continue b12 injections.  

## 2016-07-21 NOTE — Assessment & Plan Note (Signed)
Discussed diet and exercise.  Follow.  

## 2016-07-21 NOTE — Assessment & Plan Note (Addendum)
Blood pressure a little elevated recently.  On my recheck, slightly elevated (140 systolic).  Hold on making changes in her medications.  Have her to continue to spot check her pressure.  Notify me if persistent elevation.

## 2016-07-21 NOTE — Assessment & Plan Note (Signed)
She is handling things relatively well.  Does not feel needs any further intervention.  Follow.

## 2016-07-21 NOTE — Assessment & Plan Note (Signed)
Sugars have been poorly controlled.  Have tried adjusting her insulin.  She recently has been more strict with her diet.  Sugars have improved recently.  Attached list reviewed.  Hold on making changes today, since diet adjustment has improved sugars.  Discussed with her today.  Continue diet.  Follow.  Discussed need for regular eye exams.

## 2016-07-21 NOTE — Assessment & Plan Note (Signed)
On simvastatin.  Low cholesterol diet and exercise.  Follow lipid panel and liver function tests.   

## 2016-07-21 NOTE — Assessment & Plan Note (Signed)
Bowels stable.  

## 2016-07-21 NOTE — Assessment & Plan Note (Signed)
Worsened recently with the fall.  Right leg > left.  Evaluated by vascular surgery.  Note reviewed.  Is better per pt.  Follow.

## 2016-07-21 NOTE — Assessment & Plan Note (Signed)
See vascular surgery note.  Follow.

## 2016-07-24 ENCOUNTER — Ambulatory Visit: Payer: Self-pay

## 2016-08-01 ENCOUNTER — Ambulatory Visit (INDEPENDENT_AMBULATORY_CARE_PROVIDER_SITE_OTHER): Payer: Medicare HMO

## 2016-08-01 DIAGNOSIS — E538 Deficiency of other specified B group vitamins: Secondary | ICD-10-CM

## 2016-08-01 MED ORDER — CYANOCOBALAMIN 1000 MCG/ML IJ SOLN
1000.0000 ug | Freq: Once | INTRAMUSCULAR | Status: AC
  Administered 2016-08-01: 1000 ug via INTRAMUSCULAR

## 2016-08-01 NOTE — Progress Notes (Addendum)
Pt presents today to receive a b12 injection. Right deltoid. The pt did not voice any concerns or show any signs of distress during the injection.   Reviewed.  Dr Lorin Picket

## 2016-09-04 ENCOUNTER — Ambulatory Visit (INDEPENDENT_AMBULATORY_CARE_PROVIDER_SITE_OTHER): Payer: Medicare HMO | Admitting: *Deleted

## 2016-09-04 DIAGNOSIS — E538 Deficiency of other specified B group vitamins: Secondary | ICD-10-CM | POA: Diagnosis not present

## 2016-09-04 MED ORDER — CYANOCOBALAMIN 1000 MCG/ML IJ SOLN
1000.0000 ug | Freq: Once | INTRAMUSCULAR | Status: AC
  Administered 2016-09-04: 1000 ug via INTRAMUSCULAR

## 2016-09-04 NOTE — Progress Notes (Addendum)
Patient presented for monthly B 12 injection to right deltoid patient voiced no concerns and showed no signs of distress during injection.  Reviewed.  Dr Scott 

## 2016-09-05 DIAGNOSIS — R69 Illness, unspecified: Secondary | ICD-10-CM | POA: Diagnosis not present

## 2016-09-06 ENCOUNTER — Encounter (INDEPENDENT_AMBULATORY_CARE_PROVIDER_SITE_OTHER): Payer: Self-pay | Admitting: Vascular Surgery

## 2016-09-06 ENCOUNTER — Ambulatory Visit (INDEPENDENT_AMBULATORY_CARE_PROVIDER_SITE_OTHER): Payer: Medicare HMO | Admitting: Vascular Surgery

## 2016-09-06 VITALS — BP 182/72 | HR 57 | Resp 16 | Wt 239.0 lb

## 2016-09-06 DIAGNOSIS — E1140 Type 2 diabetes mellitus with diabetic neuropathy, unspecified: Secondary | ICD-10-CM | POA: Diagnosis not present

## 2016-09-06 DIAGNOSIS — I890 Lymphedema, not elsewhere classified: Secondary | ICD-10-CM

## 2016-09-06 DIAGNOSIS — I10 Essential (primary) hypertension: Secondary | ICD-10-CM | POA: Diagnosis not present

## 2016-09-06 DIAGNOSIS — Z794 Long term (current) use of insulin: Secondary | ICD-10-CM

## 2016-09-06 NOTE — Progress Notes (Signed)
MRN : [id number]  Beth Tyler is a 71 y.o. (07-Mar-1946) female who presents with chief complaint of  Chief Complaint  Patient presents with  . Follow-up  .  History of Present Illness: Patient returns today in follow up of Leg swelling and lymphedema. She is actually doing fair bit better today and her swelling is pretty mild. She has not really been wearing her compression stockings but she has been elevating her legs indwelling exercises. She has difficulties getting her compression stockings on and off. She has no new ulceration or infection.       Past Medical History:  Diagnosis Date  . Anemia   . Arthritis   . B12 deficiency   . Diabetes mellitus (HCC) 02/17/2012  . Diabetes mellitus, type II (HCC)   . Gastritis   . GERD (gastroesophageal reflux disease)   . Hypercholesterolemia   . Hypertension   . IBS (irritable bowel syndrome)   . Pernicious anemia          Past Surgical History:  Procedure Laterality Date  . BREAST BIOPSY Right 1980   neg  . BREAST SURGERY  1980   benign cyst removal  . CHOLECYSTECTOMY  1983          Family History  Problem Relation Age of Onset  . Diabetes Father   . Kidney disease Father   . Heart disease Father   . Alcohol abuse Mother   . Dementia Mother   . Diabetes Mother   . Diabetes Other   . Heart disease Son 74    CABG  . Hemophilia Brother   . Uterine cancer Sister   . Diabetes Sister   . COPD Sister   . Breast cancer Maternal Aunt 51  . Breast cancer Daughter 110     Social History     Social History  Substance Use Topics  . Smoking status: Never Smoker  . Smokeless tobacco: Never Used  . Alcohol use No  No IV drug use      Allergies  Allergen Reactions  . Hydrocodone-Acetaminophen Anaphylaxis  . Percocet [Oxycodone-Acetaminophen] Anaphylaxis  . Penicillins Nausea Only  . Tetracyclines & Related Nausea Only          Current Outpatient  Prescriptions  Medication Sig Dispense Refill  . amLODipine (NORVASC) 10 MG tablet TAKE ONE TABLET BY MOUTH ONCE DAILY 90 tablet 1  . aspirin 81 MG tablet Take 81 mg by mouth daily.    Marland Kitchen atenolol (TENORMIN) 100 MG tablet TAKE ONE TABLET BY MOUTH ONCE DAILY IN THE MORNING AND ONE-HALF TABLET IN THE EVENING (Patient taking differently: TAKE ONE TABLET BY MOUTH ONE-HALF DAILY IN THE MORNING AND ONE TABLET IN THE EVENING) 135 tablet 3  . cyanocobalamin (,VITAMIN B-12,) 1000 MCG/ML injection Inject 1,000 mcg into the muscle every 30 (thirty) days.    . ferrous fumarate (HEMOCYTE - 106 MG FE) 325 (106 FE) MG TABS tablet Take 1 tablet by mouth.    . fluticasone (CUTIVATE) 0.05 % cream USE AS DIRECTED 30 g 0  . glipiZIDE (GLUCOTROL XL) 5 MG 24 hr tablet TAKE ONE TABLET BY MOUTH ONCE DAILY WITH BREAKFAST 90 tablet 3  . hydrochlorothiazide (HYDRODIURIL) 25 MG tablet TAKE ONE TABLET BY MOUTH ONCE DAILY 90 tablet 3  . Insulin Detemir (LEVEMIR) 100 UNIT/ML Pen Inject 22 units in the morning & 12 units at bedtime 15 mL 11  . Insulin Pen Needle 32G X 8 MM MISC Use as directed three times  daily Dx E11.9 300 each 1  . lisinopril (PRINIVIL,ZESTRIL) 20 MG tablet TAKE ONE TABLET IN THE MORNING AND TWO AT BEDTIME 90 tablet 3  . magnesium oxide (MAG-OX) 400 (241.3 Mg) MG tablet TAKE ONE TABLET BY MOUTH ONCE DAILY 90 tablet 3  . metFORMIN (GLUCOPHAGE) 1000 MG tablet TAKE ONE TABLET BY MOUTH TWICE DAILY WITH MEALS 180 tablet 1  . omeprazole (PRILOSEC) 20 MG capsule TAKE ONE CAPSULE BY MOUTH TWICE DAILY 180 capsule 0  . pioglitazone (ACTOS) 30 MG tablet TAKE ONE TABLET BY MOUTH ONCE DAILY 90 tablet 3  . simvastatin (ZOCOR) 10 MG tablet TAKE ONE TABLET BY MOUTH AT BEDTIME 90 tablet 2  . triamcinolone cream (KENALOG) 0.1 % Apply 1 application topically 2 (two) times daily. 45 g 0   No current facility-administered medications for this visit.       REVIEW OF SYSTEMS(Negative unless  checked)  Constitutional: [] Weight loss[] Fever[] Chills Cardiac:[] Chest pain[] Chest pressure[] Palpitations [] Shortness of breath when laying flat [] Shortness of breath at rest [] Shortness of breath with exertion. Vascular: [x] Pain in legs with walking[] Pain in legsat rest[] Pain in legs when laying flat [] Claudication [] Pain in feet when walking [] Pain in feet at rest [] Pain in feet when laying flat [] History of DVT [] Phlebitis [x] Swelling in legs [x] Varicose veins [] Non-healing ulcers Pulmonary: [] Uses home oxygen [] Productive cough[] Hemoptysis [] Wheeze [] COPD [] Asthma Neurologic: [] Dizziness [] Blackouts [] Seizures [] History of stroke [] History of TIA[] Aphasia [] Temporary blindness[] Dysphagia [] Weaknessor numbness in arms [] Weakness or numbnessin legs Musculoskeletal: [] Arthritis [] Joint swelling [] Joint pain [] Low back pain Hematologic:[] Easy bruising[] Easy bleeding [] Hypercoagulable state [] Anemic [] Hepatitis Gastrointestinal:[] Blood in stool[] Vomiting blood[] Gastroesophageal reflux/heartburn[] Abdominal pain Genitourinary: [] Chronic kidney disease [] Difficulturination [] Frequenturination [] Burning with urination[] Hematuria Skin: [] Rashes [] Ulcers [] Wounds Psychological: [] History of anxiety[] History of major depression.    Physical Examination  BP (!) 182/72   Pulse (!) 57   Resp 16   Wt 108.4 kg (239 lb)   BMI 43.71 kg/m  Gen:  WD/WN, NAD Head: [place]/AT, No temporalis wasting. Ear/Nose/Throat: Hearing grossly intact, nares w/o erythema or drainage, trachea midline Eyes: Conjunctiva clear. Sclera non-icteric Neck: Supple.  No JVD.  Pulmonary:  Good air movement, no use of accessory muscles.  Cardiac: RRR, normal S1, S2 Vascular:  Vessel Right Left  Radial Palpable Palpable                                   Gastrointestinal: soft, non-tender/non-distended.  No guarding/reflex.  Musculoskeletal: M/S 5/5 throughout.  No deformity or atrophy. 1+ right lower extremity edema, trace to 1+ left lower extremity edema. Stasis changes are present worse on the right leg and left Neurologic: Sensation grossly intact in extremities.  Symmetrical.  Speech is fluent.  Psychiatric: Judgment intact, Mood & affect appropriate for pt's clinical situation. Dermatologic: No rashes or ulcers noted.  No cellulitis or open wounds.       Labs Recent Results (from the past 2160 hour(s))  Hemoglobin A1c     Status: Abnormal   Collection Time: 06/19/16 10:27 AM  Result Value Ref Range   Hgb A1c MFr Bld 8.3 (H) 4.6 - 6.5 %    Comment: Glycemic Control Guidelines for People with Diabetes:Non Diabetic:  <6%Goal of Therapy: <7%Additional Action Suggested:  >8%   Hepatic function panel     Status: None   Collection Time: 06/19/16 10:27 AM  Result Value Ref Range   Total Bilirubin 0.4 0.2 - 1.2 mg/dL   Bilirubin, Direct 0.1 0.0 - 0.3 mg/dL   Alkaline Phosphatase  82 39 - 117 U/L   AST 15 0 - 37 U/L   ALT 17 0 - 35 U/L   Total Protein 6.8 6.0 - 8.3 g/dL   Albumin 4.1 3.5 - 5.2 g/dL  Lipid panel     Status: None   Collection Time: 06/19/16 10:27 AM  Result Value Ref Range   Cholesterol 159 0 - 200 mg/dL    Comment: ATP III Classification       Desirable:  < 200 mg/dL               Borderline High:  200 - 239 mg/dL          High:  > = 409 mg/dL   Triglycerides 811.9 0.0 - 149.0 mg/dL    Comment: Normal:  <147 mg/dLBorderline High:  150 - 199 mg/dL   HDL 82.95 >62.13 mg/dL   VLDL 08.6 0.0 - 57.8 mg/dL   LDL Cholesterol 80 0 - 99 mg/dL   Total CHOL/HDL Ratio 3     Comment:                Men          Women1/2 Average Risk     3.4          3.3Average Risk          5.0          4.42X Average Risk          9.6          7.13X Average Risk          15.0          11.0                       NonHDL 108.73     Comment: NOTE:  Non-HDL goal should be 30 mg/dL higher than  patient's LDL goal (i.e. LDL goal of < 70 mg/dL, would have non-HDL goal of < 100 mg/dL)  Basic metabolic panel     Status: Abnormal   Collection Time: 06/19/16 10:27 AM  Result Value Ref Range   Sodium 142 135 - 145 mEq/L   Potassium 4.8 3.5 - 5.1 mEq/L   Chloride 102 96 - 112 mEq/L   CO2 31 19 - 32 mEq/L   Glucose, Bld 185 (H) 70 - 99 mg/dL   BUN 12 6 - 23 mg/dL   Creatinine, Ser 4.69 0.40 - 1.20 mg/dL   Calcium 9.6 8.4 - 62.9 mg/dL   GFR 52.84 >13.24 mL/min    Radiology No results found.    Assessment/Plan Hypertension blood pressure control important in reducing the progression of atherosclerotic disease. On appropriate oral medications.   Diabetes mellitus blood glucose control important in reducing the progression of atherosclerotic disease. Also, involved in wound healing. On appropriate medications.  Lymphedema Symptoms are stable to improved. Still would probably benefit from compression stockings but otherwise is doing well. She will return on an as-needed basis.    Festus Barren, MD  09/06/2016 12:11 PM    This note was created with Dragon medical transcription system.  Any errors from dictation are purely unintentional

## 2016-09-06 NOTE — Assessment & Plan Note (Signed)
Symptoms are stable to improved. Still would probably benefit from compression stockings but otherwise is doing well. She will return on an as-needed basis.

## 2016-10-03 ENCOUNTER — Other Ambulatory Visit (INDEPENDENT_AMBULATORY_CARE_PROVIDER_SITE_OTHER): Payer: Medicare HMO

## 2016-10-03 DIAGNOSIS — E785 Hyperlipidemia, unspecified: Secondary | ICD-10-CM | POA: Diagnosis not present

## 2016-10-03 DIAGNOSIS — E1140 Type 2 diabetes mellitus with diabetic neuropathy, unspecified: Secondary | ICD-10-CM | POA: Diagnosis not present

## 2016-10-03 DIAGNOSIS — I10 Essential (primary) hypertension: Secondary | ICD-10-CM | POA: Diagnosis not present

## 2016-10-03 DIAGNOSIS — Z794 Long term (current) use of insulin: Secondary | ICD-10-CM | POA: Diagnosis not present

## 2016-10-03 LAB — CBC WITH DIFFERENTIAL/PLATELET
Basophils Absolute: 0.1 10*3/uL (ref 0.0–0.1)
Basophils Relative: 1 % (ref 0.0–3.0)
EOS PCT: 4 % (ref 0.0–5.0)
Eosinophils Absolute: 0.3 10*3/uL (ref 0.0–0.7)
HCT: 40.3 % (ref 36.0–46.0)
Hemoglobin: 13.5 g/dL (ref 12.0–15.0)
LYMPHS ABS: 2.4 10*3/uL (ref 0.7–4.0)
Lymphocytes Relative: 32.1 % (ref 12.0–46.0)
MCHC: 33.6 g/dL (ref 30.0–36.0)
MCV: 87 fl (ref 78.0–100.0)
MONO ABS: 0.8 10*3/uL (ref 0.1–1.0)
MONOS PCT: 10.3 % (ref 3.0–12.0)
NEUTROS ABS: 3.9 10*3/uL (ref 1.4–7.7)
NEUTROS PCT: 52.6 % (ref 43.0–77.0)
PLATELETS: 252 10*3/uL (ref 150.0–400.0)
RBC: 4.64 Mil/uL (ref 3.87–5.11)
RDW: 14.1 % (ref 11.5–15.5)
WBC: 7.5 10*3/uL (ref 4.0–10.5)

## 2016-10-03 LAB — BASIC METABOLIC PANEL
BUN: 15 mg/dL (ref 6–23)
CALCIUM: 10 mg/dL (ref 8.4–10.5)
CHLORIDE: 100 meq/L (ref 96–112)
CO2: 28 mEq/L (ref 19–32)
CREATININE: 0.81 mg/dL (ref 0.40–1.20)
GFR: 73.87 mL/min (ref >60.00)
Glucose, Bld: 97 mg/dL (ref 70–99)
Potassium: 5.2 mEq/L — ABNORMAL HIGH (ref 3.5–5.1)
Sodium: 137 mEq/L (ref 135–145)

## 2016-10-03 LAB — LIPID PANEL
CHOLESTEROL: 146 mg/dL (ref 0–200)
HDL: 54.7 mg/dL (ref >39.00)
LDL CALC: 69 mg/dL (ref 0–99)
NONHDL: 91.78
Total CHOL/HDL Ratio: 3
Triglycerides: 115 mg/dL (ref 0.0–149.0)
VLDL: 23 mg/dL (ref 0.0–40.0)

## 2016-10-03 LAB — HEPATIC FUNCTION PANEL
ALK PHOS: 67 U/L (ref 39–117)
ALT: 18 U/L (ref 0–35)
AST: 17 U/L (ref 0–37)
Albumin: 4.5 g/dL (ref 3.5–5.2)
BILIRUBIN TOTAL: 0.6 mg/dL (ref 0.2–1.2)
Bilirubin, Direct: 0.1 mg/dL (ref 0.0–0.3)
Total Protein: 6.9 g/dL (ref 6.0–8.3)

## 2016-10-03 LAB — TSH: TSH: 2.75 u[IU]/mL (ref 0.35–4.50)

## 2016-10-03 LAB — HEMOGLOBIN A1C: HEMOGLOBIN A1C: 7.1 % — AB (ref 4.6–6.5)

## 2016-10-04 ENCOUNTER — Telehealth: Payer: Self-pay | Admitting: *Deleted

## 2016-10-04 NOTE — Telephone Encounter (Signed)
Patient requested lab Pt Contact: [phone]

## 2016-10-04 NOTE — Telephone Encounter (Signed)
See result note message 

## 2016-10-07 ENCOUNTER — Ambulatory Visit (INDEPENDENT_AMBULATORY_CARE_PROVIDER_SITE_OTHER): Payer: Medicare HMO | Admitting: Internal Medicine

## 2016-10-07 ENCOUNTER — Encounter: Payer: Self-pay | Admitting: Internal Medicine

## 2016-10-07 VITALS — BP 134/76 | HR 71 | Temp 98.6°F | Resp 12 | Ht 62.0 in | Wt 232.6 lb

## 2016-10-07 DIAGNOSIS — Z794 Long term (current) use of insulin: Secondary | ICD-10-CM

## 2016-10-07 DIAGNOSIS — I10 Essential (primary) hypertension: Secondary | ICD-10-CM | POA: Diagnosis not present

## 2016-10-07 DIAGNOSIS — I890 Lymphedema, not elsewhere classified: Secondary | ICD-10-CM

## 2016-10-07 DIAGNOSIS — E875 Hyperkalemia: Secondary | ICD-10-CM | POA: Diagnosis not present

## 2016-10-07 DIAGNOSIS — F439 Reaction to severe stress, unspecified: Secondary | ICD-10-CM

## 2016-10-07 DIAGNOSIS — E1140 Type 2 diabetes mellitus with diabetic neuropathy, unspecified: Secondary | ICD-10-CM

## 2016-10-07 DIAGNOSIS — E538 Deficiency of other specified B group vitamins: Secondary | ICD-10-CM

## 2016-10-07 DIAGNOSIS — E785 Hyperlipidemia, unspecified: Secondary | ICD-10-CM | POA: Diagnosis not present

## 2016-10-07 DIAGNOSIS — R69 Illness, unspecified: Secondary | ICD-10-CM | POA: Diagnosis not present

## 2016-10-07 DIAGNOSIS — R110 Nausea: Secondary | ICD-10-CM | POA: Diagnosis not present

## 2016-10-07 LAB — POTASSIUM: POTASSIUM: 5.1 meq/L (ref 3.5–5.1)

## 2016-10-07 MED ORDER — CYANOCOBALAMIN 1000 MCG/ML IJ SOLN
1000.0000 ug | Freq: Once | INTRAMUSCULAR | Status: AC
  Administered 2016-10-07: 1000 ug via INTRAMUSCULAR

## 2016-10-07 MED ORDER — PANTOPRAZOLE SODIUM 40 MG PO TBEC: 40.0000 mg | DELAYED_RELEASE_TABLET | Freq: Two times a day (BID) | ORAL | 2 refills | Status: DC

## 2016-10-07 NOTE — Progress Notes (Signed)
Patient ID: Beth Tyler, female   DOB: [birth year], 71 y.o.   MRN: [id number]   Subjective:    Patient ID: Beth Tyler, female    DOB: 1945/09/29, 71 y.o.   MRN: [id number]  HPI  Patient here for a scheduled follow up.  She has been seeing Dr Lucky Cowboy for lower extremity swelling.  Diagnosed with lymphedema.  Swelling is better.  Not wearing compression hose.  Sugars much better.  a1c improved.  She has lost weight.  Having increased nausea.  Some decreased appetite.  Abdominal discomfort.  Persistent loose stools.  No urine change.  No blood in stool reported.  Does report noticing that her arms are going to sleep - notices when wakes up - after lying down.  Discussed further w/up.  She wants to hold at this point.  Did agree on wearing splints to see if helps.     Past Medical History:  Diagnosis Date  . Anemia   . Arthritis   . B12 deficiency   . Diabetes mellitus ([place]) 02/17/2012  . Diabetes mellitus, type II ([employer])   . Gastritis   . GERD (gastroesophageal reflux disease)   . Hypercholesterolemia   . Hypertension   . IBS (irritable bowel syndrome)   . Pernicious anemia    Past Surgical History:  Procedure Laterality Date  . BREAST BIOPSY Right 1980   neg  . BREAST SURGERY  1980   benign cyst removal  . CHOLECYSTECTOMY  1983   Family History  Problem Relation Age of Onset  . Diabetes Father   . Kidney disease Father   . Heart disease Father   . Alcohol abuse Mother   . Dementia Mother   . Diabetes Mother   . Diabetes Other   . Heart disease Son 80       CABG  . Hemophilia Brother   . Uterine cancer Sister   . Diabetes Sister   . COPD Sister   . Breast cancer Maternal Aunt 51  . Breast cancer Daughter 3   Social History   Social History  . Marital status: Married    Spouse name: N/A  . Number of children: 4  . Years of education: N/A   Social History Main Topics  . Smoking status: Never Smoker  . Smokeless tobacco: Never Used  . Alcohol use No  . Drug  use: No  . Sexual activity: Not Currently   Other Topics Concern  . None   Social History Narrative   Lives with her husband. Has four children. Manager of an apartment building. No tobacco, alcohol or other drug use    Outpatient Encounter Prescriptions as of 10/07/2016  Medication Sig  . amLODipine (NORVASC) 10 MG tablet Take 1 tablet (10 mg total) by mouth daily.  Marland Kitchen aspirin 81 MG tablet Take 81 mg by mouth daily.  Marland Kitchen atenolol (TENORMIN) 100 MG tablet TAKE 1 TABLET BY MOUTH ONCE DAILY IN THE MORNING AND ONE-HALF TABLET IN THE EVENING  . ferrous fumarate (HEMOCYTE - 106 MG FE) 325 (106 FE) MG TABS tablet Take 1 tablet by mouth.  . fluticasone (CUTIVATE) 0.05 % cream USE AS DIRECTED  . glipiZIDE (GLUCOTROL XL) 5 MG 24 hr tablet TAKE ONE TABLET BY MOUTH ONCE DAILY WITH BREAKFAST  . hydrochlorothiazide (HYDRODIURIL) 25 MG tablet TAKE ONE TABLET BY MOUTH ONCE DAILY  . Insulin Detemir (LEVEMIR) 100 UNIT/ML Pen Inject 24 units in the morning & 12 units at bedtime  . Insulin Pen Needle 32G  X 8 MM MISC Use as directed three times daily Dx E11.9  . lisinopril (PRINIVIL,ZESTRIL) 20 MG tablet TAKE ONE TABLET BY MOUTH ONCE DAILY IN THE MORNING AND TWO AT BEDTIME  . magnesium oxide (MAG-OX) 400 (241.3 Mg) MG tablet TAKE ONE TABLET BY MOUTH ONCE DAILY  . metFORMIN (GLUCOPHAGE) 1000 MG tablet TAKE ONE TABLET BY MOUTH TWICE DAILY WITH MEALS  . pioglitazone (ACTOS) 30 MG tablet TAKE ONE TABLET BY MOUTH ONCE DAILY  . simvastatin (ZOCOR) 10 MG tablet TAKE ONE TABLET BY MOUTH AT BEDTIME  . triamcinolone cream (KENALOG) 0.1 % Apply topically 2 (two) times daily.  . [DISCONTINUED] cyanocobalamin (,VITAMIN B-12,) 1000 MCG/ML injection Inject 1,000 mcg into the muscle every 30 (thirty) days.  . [DISCONTINUED] omeprazole (PRILOSEC) 20 MG capsule Take 1 capsule (20 mg total) by mouth 2 (two) times daily.  . pantoprazole (PROTONIX) 40 MG tablet Take 1 tablet (40 mg total) by mouth 2 (two) times daily before a meal.    . [EXPIRED] cyanocobalamin ((VITAMIN B-12)) injection 1,000 mcg    No facility-administered encounter medications on file as of 10/07/2016.     Review of Systems  Constitutional: Positive for appetite change.       Some weight loss.    HENT: Negative for congestion and sinus pressure.   Respiratory: Negative for cough, chest tightness and shortness of breath.   Cardiovascular: Negative for chest pain and palpitations.       Leg swelling improved.    Gastrointestinal: Positive for abdominal pain, diarrhea and nausea. Negative for vomiting.  Genitourinary: Negative for difficulty urinating and dysuria.  Musculoskeletal: Negative for back pain and myalgias.  Skin: Negative for color change and rash.  Neurological: Negative for dizziness, light-headedness and headaches.  Psychiatric/Behavioral: Negative for agitation and dysphoric mood.       Objective:    Physical Exam  Constitutional: She appears well-developed and well-nourished. No distress.  HENT:  Nose: Nose normal.  Mouth/Throat: Oropharynx is clear and moist.  Neck: Neck supple. No thyromegaly present.  Cardiovascular: Normal rate and regular rhythm.   Pulmonary/Chest: Breath sounds normal. No respiratory distress. She has no wheezes.  Abdominal: Soft. Bowel sounds are normal. There is no tenderness.  Musculoskeletal:  Lower extremity swelling improved.    Lymphadenopathy:    She has no cervical adenopathy.  Skin:  Some venostasis changes - lower extremity.    Psychiatric: She has a normal mood and affect. Her behavior is normal.    BP 134/76   Pulse 71   Temp 98.6 F (37 C) (Oral)   Resp 12   Ht _0  (1.575 m)   Wt 232 lb 9.6 oz (105.5 kg)   SpO2 97%   BMI 42.54 kg/m  Wt Readings from Last 3 Encounters:  10/07/16 232 lb 9.6 oz (105.5 kg)  09/06/16 239 lb (108.4 kg)  07/19/16 234 lb 12.8 oz (106.5 kg)     Lab Results  Component Value Date   WBC 7.5 10/03/2016   HGB 13.5 10/03/2016   HCT 40.3  10/03/2016   PLT 252.0 10/03/2016   GLUCOSE 97 10/03/2016   CHOL 146 10/03/2016   TRIG 115.0 10/03/2016   HDL 54.70 10/03/2016   LDLDIRECT 74.4 02/21/2012   LDLCALC 69 10/03/2016   ALT 18 10/03/2016   AST 17 10/03/2016   NA 137 10/03/2016   K 5.1 10/07/2016   CL 100 10/03/2016   CREATININE 0.81 10/03/2016   BUN 15 10/03/2016   CO2 28 10/03/2016  TSH 2.75 10/03/2016   HGBA1C 7.1 (H) 10/03/2016   MICROALBUR 14.1 (H) 02/21/2016    Dg Knee Complete 4 Views Right  Result Date: 05/06/2016 CLINICAL DATA:  Right knee injury due to a fall today. Pain. Initial encounter. EXAM: RIGHT KNEE - COMPLETE 4+ VIEW COMPARISON:  None. FINDINGS: No acute bony or joint abnormality is identified. Severe degenerative change is present diffusely about the knee and appears worst in the medial and patellofemoral compartments. Small joint effusion is noted. IMPRESSION: No acute abnormality. Severe tricompartmental osteoarthritis. Small joint effusion. Electronically Signed   By: Inge Rise M.D.   On: 05/06/2016 15:24       Assessment & Plan:   Problem List Items Addressed This Visit    B12 deficiency    Continue b12 injections.        Relevant Medications   cyanocobalamin ((VITAMIN B-12)) injection 1,000 mcg (Completed)   Diabetes mellitus (HCC)    Discussed diet and exercise.  Recent a1c improved. Follow met b and a1c.  Up to date with eye exams.        Hyperkalemia    Recheck potassium today.        Relevant Orders   Potassium (Completed)   Hyperlipidemia    Low cholesterol diet and exercise.  On simvastatin.  Follow lipid panel and liver function tests.        Hypertension    Blood pressure on recheck improved.  Continue same medication regimen.  Follow.        Lymphedema    Swelling better.  Seeing Dr Lucky Cowboy.        Nausea - Primary    Persistent loose stool, abdominal discomfort, decreased appetite and weight loss.  Discussed with her today.  Has been on metformin for years.   Hold on changing.  Recent labs as outlined.  Check CT abdomen.  Also will have GI evaluate for further w/up.  Pt comfortable with this plan.        Relevant Orders   CT Abdomen Pelvis W Contrast   Ambulatory referral to Gastroenterology   Stress    Overall handling things relatively well.  Follow.            Einar Pheasant, MD

## 2016-10-07 NOTE — Progress Notes (Signed)
Pre-visit discussion using our clinic review tool. No additional management support is needed unless otherwise documented below in the visit note.  

## 2016-10-08 ENCOUNTER — Encounter: Payer: Self-pay | Admitting: Internal Medicine

## 2016-10-09 ENCOUNTER — Encounter: Payer: Self-pay | Admitting: Internal Medicine

## 2016-10-09 DIAGNOSIS — R110 Nausea: Secondary | ICD-10-CM | POA: Insufficient documentation

## 2016-10-09 NOTE — Assessment & Plan Note (Signed)
Persistent loose stool, abdominal discomfort, decreased appetite and weight loss.  Discussed with her today.  Has been on metformin for years.  Hold on changing.  Recent labs as outlined.  Check CT abdomen.  Also will have GI evaluate for further w/up.  Pt comfortable with this plan.

## 2016-10-09 NOTE — Assessment & Plan Note (Signed)
Discussed diet and exercise.  Recent a1c improved. Follow met b and a1c.  Up to date with eye exams.

## 2016-10-09 NOTE — Assessment & Plan Note (Signed)
Recheck potassium today. 

## 2016-10-09 NOTE — Assessment & Plan Note (Signed)
Swelling better.  Seeing Dr Wyn Quakerew.

## 2016-10-09 NOTE — Assessment & Plan Note (Signed)
Low cholesterol diet and exercise.  On simvastatin.  Follow lipid panel and liver function tests.   

## 2016-10-09 NOTE — Assessment & Plan Note (Signed)
Overall handling things relatively well.  Follow.  

## 2016-10-09 NOTE — Assessment & Plan Note (Signed)
Blood pressure on recheck improved.  Continue same medication regimen.  Follow.   

## 2016-10-09 NOTE — Assessment & Plan Note (Signed)
Continue b12 injections.  

## 2016-10-22 ENCOUNTER — Ambulatory Visit
Admission: RE | Admit: 2016-10-22 | Discharge: 2016-10-22 | Disposition: A | Discharge status: Home / Self Care | Payer: Medicare HMO | Source: Ambulatory Visit | Attending: Internal Medicine | Admitting: Internal Medicine

## 2016-10-22 ENCOUNTER — Other Ambulatory Visit: Payer: Self-pay | Admitting: Internal Medicine

## 2016-10-22 ENCOUNTER — Encounter: Payer: Self-pay | Admitting: Internal Medicine

## 2016-10-22 DIAGNOSIS — R109 Unspecified abdominal pain: Secondary | ICD-10-CM | POA: Insufficient documentation

## 2016-10-22 DIAGNOSIS — R110 Nausea: Secondary | ICD-10-CM | POA: Insufficient documentation

## 2016-10-22 DIAGNOSIS — K5730 Diverticulosis of large intestine without perforation or abscess without bleeding: Secondary | ICD-10-CM | POA: Insufficient documentation

## 2016-10-22 DIAGNOSIS — K439 Ventral hernia without obstruction or gangrene: Secondary | ICD-10-CM | POA: Insufficient documentation

## 2016-10-22 DIAGNOSIS — R194 Change in bowel habit: Secondary | ICD-10-CM | POA: Insufficient documentation

## 2016-10-22 DIAGNOSIS — R197 Diarrhea, unspecified: Secondary | ICD-10-CM | POA: Diagnosis not present

## 2016-10-22 DIAGNOSIS — R634 Abnormal weight loss: Secondary | ICD-10-CM | POA: Diagnosis not present

## 2016-10-22 DIAGNOSIS — I700 Atherosclerosis of aorta: Secondary | ICD-10-CM | POA: Insufficient documentation

## 2016-10-22 DIAGNOSIS — I708 Atherosclerosis of other arteries: Secondary | ICD-10-CM | POA: Diagnosis not present

## 2016-10-22 DIAGNOSIS — E1140 Type 2 diabetes mellitus with diabetic neuropathy, unspecified: Secondary | ICD-10-CM

## 2016-10-22 DIAGNOSIS — Z794 Long term (current) use of insulin: Principal | ICD-10-CM

## 2016-10-22 MED ORDER — IOPAMIDOL (ISOVUE-300) INJECTION 61%
100.0000 mL | Freq: Once | INTRAVENOUS | Status: AC | PRN
  Administered 2016-10-22: 100 mL via INTRAVENOUS

## 2016-10-22 NOTE — Progress Notes (Signed)
Order placed for f/u creatinine.   

## 2016-10-29 ENCOUNTER — Encounter: Payer: Self-pay | Admitting: Internal Medicine

## 2016-10-29 ENCOUNTER — Ambulatory Visit (INDEPENDENT_AMBULATORY_CARE_PROVIDER_SITE_OTHER): Payer: Medicare HMO | Admitting: *Deleted

## 2016-10-29 ENCOUNTER — Other Ambulatory Visit (INDEPENDENT_AMBULATORY_CARE_PROVIDER_SITE_OTHER): Payer: Medicare HMO

## 2016-10-29 DIAGNOSIS — E1140 Type 2 diabetes mellitus with diabetic neuropathy, unspecified: Secondary | ICD-10-CM | POA: Diagnosis not present

## 2016-10-29 DIAGNOSIS — Z794 Long term (current) use of insulin: Secondary | ICD-10-CM | POA: Diagnosis not present

## 2016-10-29 DIAGNOSIS — E538 Deficiency of other specified B group vitamins: Secondary | ICD-10-CM

## 2016-10-29 LAB — CREATININE, SERUM: CREATININE: 0.95 mg/dL (ref 0.40–1.20)

## 2016-10-29 MED ORDER — CYANOCOBALAMIN 1000 MCG/ML IJ SOLN
1000.0000 ug | Freq: Once | INTRAMUSCULAR | Status: AC
  Administered 2016-10-29: 1000 ug via INTRAMUSCULAR

## 2016-10-29 NOTE — Progress Notes (Addendum)
Patient presented for B 12 injection to left deltoid, patient voiced no concerns nor showed any signs of distress during injection.  Reviewed.  Dr Scott 

## 2016-10-31 ENCOUNTER — Other Ambulatory Visit: Payer: Self-pay

## 2016-11-12 ENCOUNTER — Ambulatory Visit: Payer: Self-pay

## 2016-11-18 DIAGNOSIS — R1013 Epigastric pain: Secondary | ICD-10-CM | POA: Diagnosis not present

## 2016-11-18 DIAGNOSIS — R1031 Right lower quadrant pain: Secondary | ICD-10-CM | POA: Diagnosis not present

## 2016-11-18 DIAGNOSIS — R197 Diarrhea, unspecified: Secondary | ICD-10-CM | POA: Diagnosis not present

## 2016-11-18 DIAGNOSIS — R634 Abnormal weight loss: Secondary | ICD-10-CM | POA: Diagnosis not present

## 2016-11-18 DIAGNOSIS — R1032 Left lower quadrant pain: Secondary | ICD-10-CM | POA: Diagnosis not present

## 2016-11-21 ENCOUNTER — Ambulatory Visit: Payer: Self-pay

## 2016-11-22 ENCOUNTER — Other Ambulatory Visit
Admission: RE | Admit: 2016-11-22 | Discharge: 2016-11-22 | Disposition: A | Discharge status: Home / Self Care | Payer: Medicare HMO | Source: Ambulatory Visit | Attending: Gastroenterology | Admitting: Gastroenterology

## 2016-11-22 DIAGNOSIS — R197 Diarrhea, unspecified: Secondary | ICD-10-CM | POA: Insufficient documentation

## 2016-11-22 LAB — GASTROINTESTINAL PANEL BY PCR, STOOL (REPLACES STOOL CULTURE)

## 2016-11-22 LAB — C DIFFICILE QUICK SCREEN W PCR REFLEX
C DIFFICILE (CDIFF) TOXIN: NEGATIVE
C Diff antigen: NEGATIVE
C Diff interpretation: NOT DETECTED

## 2016-12-17 ENCOUNTER — Encounter: Payer: Self-pay | Admitting: Internal Medicine

## 2016-12-17 ENCOUNTER — Ambulatory Visit (INDEPENDENT_AMBULATORY_CARE_PROVIDER_SITE_OTHER): Payer: Medicare HMO | Admitting: Internal Medicine

## 2016-12-17 VITALS — BP 138/70 | HR 62 | Temp 97.7°F | Resp 14 | Ht 61.81 in | Wt 237.4 lb

## 2016-12-17 DIAGNOSIS — F439 Reaction to severe stress, unspecified: Secondary | ICD-10-CM

## 2016-12-17 DIAGNOSIS — R600 Localized edema: Secondary | ICD-10-CM | POA: Diagnosis not present

## 2016-12-17 DIAGNOSIS — E1140 Type 2 diabetes mellitus with diabetic neuropathy, unspecified: Secondary | ICD-10-CM

## 2016-12-17 DIAGNOSIS — E538 Deficiency of other specified B group vitamins: Secondary | ICD-10-CM | POA: Diagnosis not present

## 2016-12-17 DIAGNOSIS — Z6841 Body Mass Index (BMI) 40.0 and over, adult: Secondary | ICD-10-CM | POA: Diagnosis not present

## 2016-12-17 DIAGNOSIS — E785 Hyperlipidemia, unspecified: Secondary | ICD-10-CM

## 2016-12-17 DIAGNOSIS — Z23 Encounter for immunization: Secondary | ICD-10-CM

## 2016-12-17 DIAGNOSIS — K589 Irritable bowel syndrome without diarrhea: Secondary | ICD-10-CM | POA: Diagnosis not present

## 2016-12-17 DIAGNOSIS — R69 Illness, unspecified: Secondary | ICD-10-CM | POA: Diagnosis not present

## 2016-12-17 DIAGNOSIS — I10 Essential (primary) hypertension: Secondary | ICD-10-CM | POA: Diagnosis not present

## 2016-12-17 DIAGNOSIS — I890 Lymphedema, not elsewhere classified: Secondary | ICD-10-CM | POA: Diagnosis not present

## 2016-12-17 DIAGNOSIS — Z794 Long term (current) use of insulin: Secondary | ICD-10-CM

## 2016-12-17 DIAGNOSIS — I700 Atherosclerosis of aorta: Secondary | ICD-10-CM

## 2016-12-17 MED ORDER — CYANOCOBALAMIN 1000 MCG/ML IJ SOLN
1000.0000 ug | Freq: Once | INTRAMUSCULAR | Status: AC
  Administered 2016-12-17: 1000 ug via INTRAMUSCULAR

## 2016-12-17 MED ORDER — PANTOPRAZOLE SODIUM 40 MG PO TBEC: 40.0000 mg | DELAYED_RELEASE_TABLET | Freq: Two times a day (BID) | ORAL | 2 refills | Status: DC

## 2016-12-17 NOTE — Progress Notes (Signed)
Patient ID: Beth Tyler, female   DOB: 06-Nov-1945, 71 y.o.   MRN: [id number]   Subjective:    Patient ID: Beth Tyler, female    DOB: 1945/05/22, 71 y.o.   MRN: [id number]  HPI  Patient here for a scheduled follow up.  She continues to have persistent problems with diarrhea.  She saw GI.  Planning for colonoscopy in 03/2017.  Gave her levsin.  Did not help.  Taking a probiotic.  Is eating.  No vomiting.  No chest pain.  Breathing stable.  No acid reflux.  Having persistent issues with her left eye lid.  Seeing ophthalmology.  Has f/u soon.  Still with issues with swelling and stasis changes in her legs.  Has seen Dr Lucky Cowboy.  Had mentioned the possibility of lymphedema pump.  Continues leg elevation.  Does not tolerate compression hose.  Handling stress.  States sugars doing better.  Occasional low sugar.  Discussed importance of eating regular meals and not going long periods without eating.     Past Medical History:  Diagnosis Date  . Anemia   . Arthritis   . B12 deficiency   . Diabetes mellitus ([place]) 02/17/2012  . Diabetes mellitus, type II ([place])   . Gastritis   . GERD (gastroesophageal reflux disease)   . Hypercholesterolemia   . Hypertension   . IBS (irritable bowel syndrome)   . Pernicious anemia    Past Surgical History:  Procedure Laterality Date  . BREAST BIOPSY Right 1980   neg  . BREAST SURGERY  1980   benign cyst removal  . CHOLECYSTECTOMY  1983   Family History  Problem Relation Age of Onset  . Diabetes Father   . Kidney disease Father   . Heart disease Father   . Alcohol abuse Mother   . Dementia Mother   . Diabetes Mother   . Diabetes Other   . Heart disease Son 54       CABG  . Hemophilia Brother   . Uterine cancer Sister   . Diabetes Sister   . COPD Sister   . Breast cancer Maternal Aunt 51  . Breast cancer Daughter 2   Social History   Social History  . Marital status: Married    Spouse name: N/A  . Number of children: 4  . Years of  education: N/A   Social History Main Topics  . Smoking status: Never Smoker  . Smokeless tobacco: Never Used  . Alcohol use No  . Drug use: No  . Sexual activity: Not Currently   Other Topics Concern  . None   Social History Narrative   Lives with her husband. Has four children. Manager of an apartment building. No tobacco, alcohol or other drug use    Outpatient Encounter Prescriptions as of 12/17/2016  Medication Sig  . amLODipine (NORVASC) 10 MG tablet Take 1 tablet (10 mg total) by mouth daily.  Marland Kitchen aspirin 81 MG tablet Take 81 mg by mouth daily.  Marland Kitchen atenolol (TENORMIN) 100 MG tablet TAKE 1 TABLET BY MOUTH ONCE DAILY IN THE MORNING AND ONE-HALF TABLET IN THE EVENING  . ferrous fumarate (HEMOCYTE - 106 MG FE) 325 (106 FE) MG TABS tablet Take 1 tablet by mouth.  . fluticasone (CUTIVATE) 0.05 % cream USE AS DIRECTED  . glipiZIDE (GLUCOTROL XL) 5 MG 24 hr tablet TAKE ONE TABLET BY MOUTH ONCE DAILY WITH BREAKFAST  . hydrochlorothiazide (HYDRODIURIL) 25 MG tablet TAKE ONE TABLET BY MOUTH ONCE DAILY  . hyoscyamine (  LEVSIN SL) 0.125 MG SL tablet Place under the tongue.  . Insulin Detemir (LEVEMIR) 100 UNIT/ML Pen Inject 24 units in the morning & 12 units at bedtime  . Insulin Pen Needle 32G X 8 MM MISC Use as directed three times daily Dx E11.9  . lisinopril (PRINIVIL,ZESTRIL) 20 MG tablet TAKE ONE TABLET BY MOUTH ONCE DAILY IN THE MORNING AND TWO AT BEDTIME  . magnesium oxide (MAG-OX) 400 (241.3 Mg) MG tablet TAKE ONE TABLET BY MOUTH ONCE DAILY  . metFORMIN (GLUCOPHAGE) 1000 MG tablet TAKE ONE TABLET BY MOUTH TWICE DAILY WITH MEALS  . pantoprazole (PROTONIX) 40 MG tablet Take 1 tablet (40 mg total) by mouth 2 (two) times daily before a meal.  . pioglitazone (ACTOS) 30 MG tablet TAKE ONE TABLET BY MOUTH ONCE DAILY  . polyethylene glycol-electrolytes (NULYTELY/GOLYTELY) 420 g solution Take by mouth.  . simvastatin (ZOCOR) 10 MG tablet TAKE ONE TABLET BY MOUTH AT BEDTIME  . triamcinolone  cream (KENALOG) 0.1 % Apply topically 2 (two) times daily.  . [DISCONTINUED] pantoprazole (PROTONIX) 40 MG tablet Take 1 tablet (40 mg total) by mouth 2 (two) times daily before a meal.  . [EXPIRED] cyanocobalamin ((VITAMIN B-12)) injection 1,000 mcg    No facility-administered encounter medications on file as of 12/17/2016.     Review of Systems  Constitutional: Negative for appetite change and unexpected weight change.  HENT: Negative for congestion and sinus pressure.   Respiratory: Negative for cough, chest tightness and shortness of breath.   Cardiovascular: Positive for leg swelling. Negative for chest pain and palpitations.  Gastrointestinal: Positive for diarrhea. Negative for nausea and vomiting.  Genitourinary: Negative for difficulty urinating and dysuria.  Musculoskeletal: Negative for joint swelling and myalgias.  Skin: Negative for color change and rash.  Neurological: Negative for dizziness, light-headedness and headaches.  Psychiatric/Behavioral: Negative for agitation and dysphoric mood.       Objective:     Blood pressure rechecked by me:  138/79  Physical Exam  Constitutional: She appears well-developed and well-nourished. No distress.  HENT:  Nose: Nose normal.  Mouth/Throat: Oropharynx is clear and moist.  Neck: Neck supple. No thyromegaly present.  Cardiovascular: Normal rate and regular rhythm.   Pulmonary/Chest: Breath sounds normal. No respiratory distress. She has no wheezes.  Abdominal: Soft. Bowel sounds are normal.  No significant increased tenderness to palpation.    Musculoskeletal: She exhibits no tenderness.  Increased pedal and lower extremity edema.    Lymphadenopathy:    She has no cervical adenopathy.  Skin:  Stasis changes - lower extremity.      BP 138/70   Pulse 62   Temp 97.7 F (36.5 C) (Oral)   Resp 14   Ht 5' 1.81" (1.57 m)   Wt 237 lb 6.4 oz (107.7 kg)   SpO2 99%   BMI 43.69 kg/m  Wt Readings from Last 3 Encounters:    12/17/16 237 lb 6.4 oz (107.7 kg)  10/07/16 232 lb 9.6 oz (105.5 kg)  09/06/16 239 lb (108.4 kg)     Lab Results  Component Value Date   WBC 7.5 10/03/2016   HGB 13.5 10/03/2016   HCT 40.3 10/03/2016   PLT 252.0 10/03/2016   GLUCOSE 97 10/03/2016   CHOL 146 10/03/2016   TRIG 115.0 10/03/2016   HDL 54.70 10/03/2016   LDLDIRECT 74.4 02/21/2012   LDLCALC 69 10/03/2016   ALT 18 10/03/2016   AST 17 10/03/2016   NA 137 10/03/2016   K 5.1 10/07/2016  CL 100 10/03/2016   CREATININE 0.95 10/29/2016   BUN 15 10/03/2016   CO2 28 10/03/2016   TSH 2.75 10/03/2016   HGBA1C 7.1 (H) 10/03/2016   MICROALBUR 14.1 (H) 02/21/2016       Assessment & Plan:   Problem List Items Addressed This Visit    Aortic atherosclerosis ([place])    On simvastatin.        B12 deficiency - Primary    Continue B12 injections.        Relevant Medications   cyanocobalamin ((VITAMIN B-12)) injection 1,000 mcg (Completed)   Bilateral leg edema    Unable to tolerate compression hose.  Saw dr Lucky Cowboy.  Discussed further treatment.        BMI 40.0-44.9, adult (HCC)    Discussed diet and exercise.        Diabetes mellitus ([place])    Low carb diet and exercise.  Discussed importance of eating regular meals and not going long periods without eating.  Notify me if persistent problems with any low sugars. Follow met b and a1c.        Relevant Orders   Hemoglobin O9G   Basic metabolic panel   Microalbumin / creatinine urine ratio   Hyperlipidemia    On simvastatin.  Low cholesterol diet and exercise.  Follow lipid panel and liver function tests.        Relevant Orders   Hepatic function panel   Lipid panel   Hypertension    Blood pressure on recheck improved some.  Has been under reasonable control.  Follow pressures.  Same medication regimen.  Follow metabolic panel.        IBS (irritable bowel syndrome)    Persistent diarrhea.  Saw GI.  Note reviewed.  Trial of levsin.  Did not help.  Planning for  colonoscopy.        Relevant Medications   polyethylene glycol-electrolytes (NULYTELY/GOLYTELY) 420 g solution   pantoprazole (PROTONIX) 40 MG tablet   Lymphedema    Seeing Dr Lucky Cowboy.  Unable to wear compression hose.  Had mentioned lymphedema pump.  D/w Dr Lucky Cowboy if this would be option for pt.        Stress    Overall doing relatively well.  Follow.         Other Visit Diagnoses    Encounter for immunization       Relevant Medications   polyethylene glycol-electrolytes (NULYTELY/GOLYTELY) 420 g solution   pantoprazole (PROTONIX) 40 MG tablet   cyanocobalamin ((VITAMIN B-12)) injection 1,000 mcg (Completed)   Other Relevant Orders   Flu vaccine HIGH DOSE PF (Completed)       Einar Pheasant, MD

## 2016-12-18 DIAGNOSIS — R69 Illness, unspecified: Secondary | ICD-10-CM | POA: Diagnosis not present

## 2016-12-20 ENCOUNTER — Encounter: Payer: Self-pay | Admitting: Internal Medicine

## 2016-12-20 NOTE — Assessment & Plan Note (Signed)
On simvastatin.  Low cholesterol diet and exercise.  Follow lipid panel and liver function tests.   

## 2016-12-20 NOTE — Assessment & Plan Note (Signed)
Overall doing relatively well.  Follow.   

## 2016-12-20 NOTE — Assessment & Plan Note (Signed)
On simvastatin.   

## 2016-12-20 NOTE — Assessment & Plan Note (Signed)
Blood pressure on recheck improved some.  Has been under reasonable control.  Follow pressures.  Same medication regimen.  Follow metabolic panel.

## 2016-12-20 NOTE — Assessment & Plan Note (Signed)
Persistent diarrhea.  Saw GI.  Note reviewed.  Trial of levsin.  Did not help.  Planning for colonoscopy.

## 2016-12-20 NOTE — Assessment & Plan Note (Signed)
Seeing Dr Wyn Quaker.  Unable to wear compression hose.  Had mentioned lymphedema pump.  D/w Dr Wyn Quaker if this would be option for pt.

## 2016-12-20 NOTE — Assessment & Plan Note (Signed)
Discussed diet and exercise 

## 2016-12-20 NOTE — Assessment & Plan Note (Signed)
Low carb diet and exercise.  Discussed importance of eating regular meals and not going long periods without eating.  Notify me if persistent problems with any low sugars. Follow met b and a1c.

## 2016-12-20 NOTE — Assessment & Plan Note (Signed)
Continue B12 injections.   

## 2016-12-20 NOTE — Assessment & Plan Note (Signed)
Unable to tolerate compression hose.  Saw dr Wyn Quaker.  Discussed further treatment.

## 2016-12-25 ENCOUNTER — Other Ambulatory Visit: Payer: Self-pay | Admitting: Internal Medicine

## 2017-01-07 ENCOUNTER — Other Ambulatory Visit: Payer: Self-pay | Admitting: Internal Medicine

## 2017-01-13 ENCOUNTER — Other Ambulatory Visit: Payer: Self-pay | Admitting: Internal Medicine

## 2017-01-15 ENCOUNTER — Other Ambulatory Visit: Payer: Self-pay | Admitting: Internal Medicine

## 2017-02-26 ENCOUNTER — Ambulatory Visit (INDEPENDENT_AMBULATORY_CARE_PROVIDER_SITE_OTHER): Payer: Medicare HMO

## 2017-02-26 ENCOUNTER — Other Ambulatory Visit (INDEPENDENT_AMBULATORY_CARE_PROVIDER_SITE_OTHER): Payer: Medicare HMO

## 2017-02-26 DIAGNOSIS — E785 Hyperlipidemia, unspecified: Secondary | ICD-10-CM

## 2017-02-26 DIAGNOSIS — Z794 Long term (current) use of insulin: Secondary | ICD-10-CM | POA: Diagnosis not present

## 2017-02-26 DIAGNOSIS — E538 Deficiency of other specified B group vitamins: Secondary | ICD-10-CM

## 2017-02-26 DIAGNOSIS — E1140 Type 2 diabetes mellitus with diabetic neuropathy, unspecified: Secondary | ICD-10-CM

## 2017-02-26 LAB — LIPID PANEL
CHOL/HDL RATIO: 2
Cholesterol: 128 mg/dL (ref 0–200)
HDL: 56.5 mg/dL (ref >39.00)
LDL CALC: 56 mg/dL (ref 0–99)
NONHDL: 71.32
Triglycerides: 77 mg/dL (ref 0.0–149.0)
VLDL: 15.4 mg/dL (ref 0.0–40.0)

## 2017-02-26 LAB — HEPATIC FUNCTION PANEL
ALBUMIN: 4.2 g/dL (ref 3.5–5.2)
ALT: 16 U/L (ref 0–35)
AST: 16 U/L (ref 0–37)
Alkaline Phosphatase: 71 U/L (ref 39–117)
BILIRUBIN TOTAL: 0.4 mg/dL (ref 0.2–1.2)
Bilirubin, Direct: 0 mg/dL (ref 0.0–0.3)
Total Protein: 7 g/dL (ref 6.0–8.3)

## 2017-02-26 LAB — BASIC METABOLIC PANEL
BUN: 20 mg/dL (ref 6–23)
CO2: 31 meq/L (ref 19–32)
Calcium: 9.4 mg/dL (ref 8.4–10.5)
Chloride: 101 mEq/L (ref 96–112)
Creatinine, Ser: 0.76 mg/dL (ref 0.40–1.20)
GFR: 79.41 mL/min (ref >60.00)
GLUCOSE: 115 mg/dL — AB (ref 70–99)
POTASSIUM: 4.9 meq/L (ref 3.5–5.1)
SODIUM: 139 meq/L (ref 135–145)

## 2017-02-26 LAB — MICROALBUMIN / CREATININE URINE RATIO
Creatinine,U: 31.3 mg/dL
MICROALB UR: 3.8 mg/dL — AB (ref 0.0–1.9)
Microalb Creat Ratio: 12.3 mg/g (ref 0.0–30.0)

## 2017-02-26 LAB — HEMOGLOBIN A1C: HEMOGLOBIN A1C: 7 % — AB (ref 4.6–6.5)

## 2017-02-26 MED ORDER — CYANOCOBALAMIN 1000 MCG/ML IJ SOLN
1000.0000 ug | Freq: Once | INTRAMUSCULAR | Status: AC
  Administered 2017-02-26: 1000 ug via INTRAMUSCULAR

## 2017-02-26 NOTE — Progress Notes (Addendum)
Patient comes in for B 12 injection.  Injected Right deltoid.  Patient tolerated injection well.   Reviewed.  Dr Scott 

## 2017-02-28 ENCOUNTER — Encounter: Payer: Self-pay | Admitting: Internal Medicine

## 2017-03-04 ENCOUNTER — Ambulatory Visit (INDEPENDENT_AMBULATORY_CARE_PROVIDER_SITE_OTHER): Payer: Medicare HMO | Admitting: Internal Medicine

## 2017-03-04 ENCOUNTER — Encounter: Payer: Self-pay | Admitting: Internal Medicine

## 2017-03-04 ENCOUNTER — Other Ambulatory Visit: Payer: Self-pay | Admitting: Internal Medicine

## 2017-03-04 DIAGNOSIS — Z6841 Body Mass Index (BMI) 40.0 and over, adult: Secondary | ICD-10-CM

## 2017-03-04 DIAGNOSIS — E538 Deficiency of other specified B group vitamins: Secondary | ICD-10-CM | POA: Diagnosis not present

## 2017-03-04 DIAGNOSIS — Z794 Long term (current) use of insulin: Secondary | ICD-10-CM | POA: Diagnosis not present

## 2017-03-04 DIAGNOSIS — I700 Atherosclerosis of aorta: Secondary | ICD-10-CM

## 2017-03-04 DIAGNOSIS — I890 Lymphedema, not elsewhere classified: Secondary | ICD-10-CM

## 2017-03-04 DIAGNOSIS — I10 Essential (primary) hypertension: Secondary | ICD-10-CM | POA: Diagnosis not present

## 2017-03-04 DIAGNOSIS — Z0000 Encounter for general adult medical examination without abnormal findings: Secondary | ICD-10-CM

## 2017-03-04 DIAGNOSIS — K589 Irritable bowel syndrome without diarrhea: Secondary | ICD-10-CM | POA: Diagnosis not present

## 2017-03-04 DIAGNOSIS — F439 Reaction to severe stress, unspecified: Secondary | ICD-10-CM | POA: Diagnosis not present

## 2017-03-04 DIAGNOSIS — R059 Cough, unspecified: Secondary | ICD-10-CM

## 2017-03-04 DIAGNOSIS — R05 Cough: Secondary | ICD-10-CM | POA: Diagnosis not present

## 2017-03-04 DIAGNOSIS — Z0001 Encounter for general adult medical examination with abnormal findings: Secondary | ICD-10-CM | POA: Diagnosis not present

## 2017-03-04 DIAGNOSIS — Z1231 Encounter for screening mammogram for malignant neoplasm of breast: Secondary | ICD-10-CM

## 2017-03-04 DIAGNOSIS — E1140 Type 2 diabetes mellitus with diabetic neuropathy, unspecified: Secondary | ICD-10-CM | POA: Diagnosis not present

## 2017-03-04 DIAGNOSIS — E785 Hyperlipidemia, unspecified: Secondary | ICD-10-CM | POA: Diagnosis not present

## 2017-03-04 DIAGNOSIS — R69 Illness, unspecified: Secondary | ICD-10-CM | POA: Diagnosis not present

## 2017-03-04 LAB — HM DIABETES FOOT EXAM

## 2017-03-04 MED ORDER — BENZONATATE 100 MG PO CAPS: 100.0000 mg | ORAL_CAPSULE | Freq: Three times a day (TID) | ORAL | 0 refills | Status: DC | PRN

## 2017-03-04 NOTE — Patient Instructions (Signed)
nasacort (or flonase) nasal spray - 2 sprays each nostril one time per day.  Do this in the evening.    Saline nasal spray - flush nose at least 2x/day

## 2017-03-04 NOTE — Progress Notes (Signed)
Patient ID: Beth Tyler, female   DOB: 04/09/1945, 71 y.o.   MRN: [id number]   Subjective:    Patient ID: Beth Tyler, female    DOB: 04/09/1945, 71 y.o.   MRN: [id number]  HPI  Patient here for her physical exam.  She has noticed recently some increased runny nose and drainage.  Has been tired and some aching.  Some cough - non productive.  No fever.  Cough is keeping her awake.  Feels this is contributing to her fatigue.  No sob.  No chest pain.  No acid reflux.  No bowel change.  Persistent issues.  Discussed labs.  Discussed a1c - slightly improved.  Sugars have been doing better.  Trying to watch what she eats.  No significant low sugars.     Past Medical History:  Diagnosis Date  . Anemia   . Arthritis   . B12 deficiency   . Diabetes mellitus ([facility]) 02/17/2012  . Diabetes mellitus, type II ([facility])   . Gastritis   . GERD (gastroesophageal reflux disease)   . Hypercholesterolemia   . Hypertension   . IBS (irritable bowel syndrome)   . Pernicious anemia    Past Surgical History:  Procedure Laterality Date  . BREAST BIOPSY Right 1980   neg  . BREAST SURGERY  1980   benign cyst removal  . CHOLECYSTECTOMY  1983   Family History  Problem Relation Age of Onset  . Diabetes Father   . Kidney disease Father   . Heart disease Father   . Alcohol abuse Mother   . Dementia Mother   . Diabetes Mother   . Diabetes Other   . Heart disease Son [id number]       CABG  . Hemophilia Brother   . Uterine cancer Sister   . Diabetes Sister   . COPD Sister   . Breast cancer Maternal Aunt 51  . Breast cancer Daughter [id number]   Social History   Socioeconomic History  . Marital status: Married    Spouse name: None  . Number of children: 4  . Years of education: None  . Highest education level: None  Social Needs  . Financial resource strain: None  . Food insecurity - worry: None  . Food insecurity - inability: None  . Transportation needs - medical: None  . Transportation needs -  non-medical: None  Occupational History  . None  Tobacco Use  . Smoking status: Never Smoker  . Smokeless tobacco: Never Used  Substance and Sexual Activity  . Alcohol use: No    Alcohol/week: 0.0 oz  . Drug use: No  . Sexual activity: Not Currently  Other Topics Concern  . None  Social History Narrative   Lives with her husband. Has four children. Manager of an apartment building. No tobacco, alcohol or other drug use    Outpatient Encounter Medications as of 03/04/2017  Medication Sig  . amLODipine (NORVASC) 10 MG tablet Take 1 tablet (10 mg total) by mouth daily.  Marland Kitchen. aspirin 81 MG tablet Take 81 mg by mouth daily.  Marland Kitchen. atenolol (TENORMIN) 100 MG tablet TAKE 1 TABLET BY MOUTH ONCE DAILY IN THE MORNING AND ONE-HALF TABLET IN THE EVENING  . ferrous fumarate (HEMOCYTE - 106 MG FE) 325 (106 FE) MG TABS tablet Take 1 tablet by mouth.  . fluticasone (CUTIVATE) 0.05 % cream USE AS DIRECTED  . glipiZIDE (GLUCOTROL XL) 5 MG 24 hr tablet TAKE ONE TABLET BY MOUTH ONCE DAILY WITH BREAKFAST  .  hydrochlorothiazide (HYDRODIURIL) 25 MG tablet TAKE ONE TABLET BY MOUTH ONCE DAILY  . Insulin Detemir (LEVEMIR) 100 UNIT/ML Pen Inject 24 units in the morning & 12 units at bedtime  . Insulin Pen Needle 32G X 8 MM MISC Use as directed three times daily Dx E11.9  . lisinopril (PRINIVIL,ZESTRIL) 20 MG tablet TAKE 1 TABLET BY MOUTH IN THE MORNING AND 2 AT BEDTIME  . magnesium oxide (MAG-OX) 400 (241.3 Mg) MG tablet TAKE ONE TABLET BY MOUTH ONCE DAILY  . metFORMIN (GLUCOPHAGE) 1000 MG tablet TAKE 1 TABLET BY MOUTH TWICE DAILY WITH  MEALS  . pantoprazole (PROTONIX) 40 MG tablet Take 1 tablet (40 mg total) by mouth 2 (two) times daily before a meal.  . pioglitazone (ACTOS) 30 MG tablet TAKE 1 TABLET BY MOUTH ONCE DAILY  . polyethylene glycol-electrolytes (NULYTELY/GOLYTELY) 420 g solution Take by mouth.  . simvastatin (ZOCOR) 10 MG tablet TAKE ONE TABLET BY MOUTH AT BEDTIME  . triamcinolone cream (KENALOG) 0.1 %  APPLY  CREAM EXTERNALLY TWICE DAILY  . benzonatate (TESSALON) 100 MG capsule Take 1 capsule (100 mg total) by mouth 3 (three) times daily as needed for cough.  . [DISCONTINUED] hyoscyamine (LEVSIN SL) 0.125 MG SL tablet Place under the tongue.   No facility-administered encounter medications on file as of 03/04/2017.     Review of Systems  Constitutional: Positive for fatigue. Negative for unexpected weight change.  HENT: Positive for congestion and postnasal drip. Negative for sinus pressure.   Eyes: Negative for pain and visual disturbance.  Respiratory: Positive for cough. Negative for chest tightness and shortness of breath.   Cardiovascular: Negative for chest pain and palpitations.  Gastrointestinal: Negative for abdominal pain, constipation, nausea and vomiting.  Genitourinary: Negative for difficulty urinating and dysuria.  Musculoskeletal: Negative for gait problem and myalgias.  Skin: Negative for color change and rash.  Neurological: Negative for dizziness, light-headedness and headaches.  Hematological: Negative for adenopathy. Does not bruise/bleed easily.  Psychiatric/Behavioral: Negative for agitation and dysphoric mood.       Objective:    Physical Exam  Constitutional: She is oriented to person, place, and time. She appears well-developed and well-nourished. No distress.  HENT:  Nose: Nose normal.  Mouth/Throat: Oropharynx is clear and moist.  Eyes: Right eye exhibits no discharge. Left eye exhibits no discharge. No scleral icterus.  Neck: Neck supple. No thyromegaly present.  Cardiovascular: Normal rate and regular rhythm.  Pulmonary/Chest: Breath sounds normal. No accessory muscle usage. No tachypnea. No respiratory distress. She has no decreased breath sounds. She has no wheezes. She has no rhonchi. Right breast exhibits no inverted nipple, no mass, no nipple discharge and no tenderness (no axillary adenopathy). Left breast exhibits no inverted nipple, no mass, no  nipple discharge and no tenderness (no axilarry adenopathy).  Abdominal: Soft. Bowel sounds are normal. There is no tenderness.  Musculoskeletal: She exhibits no tenderness.  No increased edema.    Lymphadenopathy:    She has no cervical adenopathy.  Neurological: She is alert and oriented to person, place, and time.  Feet:  No lesions.  Intact to light touch.  Some decreased sensation - distally - able to send pin prick.    Skin: Skin is warm. No rash noted.  Lower extremity skin changes - stable.   Psychiatric: She has a normal mood and affect. Her behavior is normal.    BP 132/76 (BP Location: Left Arm, Patient Position: Sitting, Cuff Size: Normal)   Pulse 68   Temp 98.7 F (  37.1 C) (Oral)   Ht 5\' 1"  (1.549 m)   Wt 234 lb (106.1 kg)   SpO2 99%   BMI 44.21 kg/m  Wt Readings from Last 3 Encounters:  03/04/17 234 lb (106.1 kg)  12/17/16 237 lb 6.4 oz (107.7 kg)  10/07/16 232 lb 9.6 oz (105.5 kg)     Lab Results  Component Value Date   WBC 7.5 10/03/2016   HGB 13.5 10/03/2016   HCT 40.3 10/03/2016   PLT 252.0 10/03/2016   GLUCOSE 115 (H) 02/26/2017   CHOL 128 02/26/2017   TRIG 77.0 02/26/2017   HDL 56.50 02/26/2017   LDLDIRECT 74.4 02/21/2012   LDLCALC 56 02/26/2017   ALT 16 02/26/2017   AST 16 02/26/2017   NA 139 02/26/2017   K 4.9 02/26/2017   CL 101 02/26/2017   CREATININE 0.76 02/26/2017   BUN 20 02/26/2017   CO2 31 02/26/2017   TSH 2.75 10/03/2016   HGBA1C 7.0 (H) 02/26/2017   MICROALBUR 3.8 (H) 02/26/2017       Assessment & Plan:   Problem List Items Addressed This Visit    Aortic atherosclerosis ([facility])    On simvastatin.       B12 deficiency    Continue B12 injections.       BMI 40.0-44.9, adult ([facility])    Discussed diet and exercise.  Follow.        Cough    With increased congestion and cough now.  Do not feel abx warranted.  Flonase nasal spray and saline nasal spray as directed.  Tessalon perles prn.  Follow.  Notify me if persistent.          Diabetes mellitus ([facility])    a1c 7.0 on recent check.  Discussed low carb diet and exercise.  Same medication regimen.  Follow.        Relevant Orders   Hemoglobin A1c   Health care maintenance    Physical today 03/04/17.  Scheduled for mammogram 03/2017.  colonoscopy 11/29/11 - one rectal polyp and diverticulosis.  Recommended f/u colonoscopy in 10 years.        Hyperlipidemia    On simvastatin.  Low cholesterol diet and exercise.  Follow lipid panel and liver function tests.        Relevant Orders   Lipid panel   Hepatic function panel   Hypertension    Blood pressure as outlined.  Continue same medication regimen.  Follow pressures.  Follow metabolic panel.        Relevant Orders   Basic metabolic panel   IBS (irritable bowel syndrome)    Persistent diarrhea.  Has seen GI.  Overall stable.       Lymphedema    Unable to wear compression hose.  Seeing Dr Wyn Quakerew.       Stress    Discussed with her today.  Overall doing well.  Does not feel needs any further intervention.            Dale DurhamSCOTT, Lydia Toren, MD

## 2017-03-04 NOTE — Assessment & Plan Note (Signed)
Physical today 03/04/17.  Scheduled for mammogram 03/2017.  colonoscopy 11/29/11 - one rectal polyp and diverticulosis.  Recommended f/u colonoscopy in 10 years.

## 2017-03-08 ENCOUNTER — Encounter: Payer: Self-pay | Admitting: Internal Medicine

## 2017-03-08 NOTE — Assessment & Plan Note (Signed)
Discussed with her today.  Overall doing well.  Does not feel needs any further intervention.

## 2017-03-08 NOTE — Assessment & Plan Note (Signed)
Persistent diarrhea.  Has seen GI.  Overall stable.

## 2017-03-08 NOTE — Assessment & Plan Note (Signed)
On simvastatin.  Low cholesterol diet and exercise.  Follow lipid panel and liver function tests.   

## 2017-03-08 NOTE — Assessment & Plan Note (Signed)
Unable to wear compression hose.  Seeing Dr Wyn Quakerew.

## 2017-03-08 NOTE — Assessment & Plan Note (Signed)
Blood pressure as outlined.  Continue same medication regimen.  Follow pressures.  Follow metabolic panel.  

## 2017-03-08 NOTE — Assessment & Plan Note (Signed)
On simvastatin.   

## 2017-03-08 NOTE — Assessment & Plan Note (Signed)
a1c 7.0 on recent check.  Discussed low carb diet and exercise.  Same medication regimen.  Follow.

## 2017-03-08 NOTE — Assessment & Plan Note (Signed)
Continue B12 injections.   

## 2017-03-08 NOTE — Assessment & Plan Note (Signed)
With increased congestion and cough now.  Do not feel abx warranted.  Flonase nasal spray and saline nasal spray as directed.  Tessalon perles prn.  Follow.  Notify me if persistent.

## 2017-03-08 NOTE — Assessment & Plan Note (Signed)
Discussed diet and exercise.  Follow.  

## 2017-03-10 DIAGNOSIS — Z6841 Body Mass Index (BMI) 40.0 and over, adult: Secondary | ICD-10-CM | POA: Diagnosis not present

## 2017-03-10 DIAGNOSIS — K580 Irritable bowel syndrome with diarrhea: Secondary | ICD-10-CM | POA: Diagnosis not present

## 2017-03-11 ENCOUNTER — Encounter: Admission: RE | Payer: Self-pay | Source: Ambulatory Visit

## 2017-03-11 ENCOUNTER — Ambulatory Visit: Admission: RE | Admit: 2017-03-11 | Payer: Medicare HMO | Source: Ambulatory Visit | Admitting: Gastroenterology

## 2017-03-11 SURGERY — COLONOSCOPY WITH PROPOFOL
Anesthesia: General

## 2017-03-12 DIAGNOSIS — K580 Irritable bowel syndrome with diarrhea: Secondary | ICD-10-CM | POA: Diagnosis not present

## 2017-04-03 ENCOUNTER — Ambulatory Visit
Admission: RE | Admit: 2017-04-03 | Discharge: 2017-04-03 | Disposition: A | Discharge status: Home / Self Care | Payer: Medicare HMO | Source: Ambulatory Visit | Attending: Internal Medicine | Admitting: Internal Medicine

## 2017-04-03 DIAGNOSIS — Z1231 Encounter for screening mammogram for malignant neoplasm of breast: Secondary | ICD-10-CM

## 2017-04-07 ENCOUNTER — Ambulatory Visit: Payer: Self-pay | Admitting: *Deleted

## 2017-04-07 ENCOUNTER — Ambulatory Visit: Payer: Self-pay

## 2017-04-07 ENCOUNTER — Other Ambulatory Visit: Payer: Self-pay | Admitting: Internal Medicine

## 2017-04-07 NOTE — Telephone Encounter (Signed)
Pt reports right leg pain behind knee after walking for 5-10 minutes, relieved with sitting. NO pain at rest just "whole leg achy." Has h/o lymphedema; reports "leg looks no different than usual, "always mild swelling and redness." Denies any numbness,tingling,  reports right foot warm to touch without discoloration. "Some" relief with Tylenol. States no fever. Agent had made appt with Dr. Lorin PicketScott for Wednesday. Pt states has to wait until after the 1st for appt.due to insurance changes. Advised to call back if symptoms worsen, to monitor leg for increased swelling, redness, fever.    Reason for Disposition . [1] Leg pain which occurs after walking a certain distance AND [2] disappears with rest AND [3] age > 6050  Answer Assessment - Initial Assessment Questions 1. ONSET: "When did the pain start?"      04/03/17 2. LOCATION: "Where is the pain located?"      Right leg, behind knee. Entire leg "achy." 3. PAIN: "How bad is the pain?"    (Scale 1-10; or mild, moderate, severe)   -  MILD (1-3): doesn't interfere with normal activities    -  MODERATE (4-7): interferes with normal activities (e.g., work or school) or awakens from sleep, limping    -  SEVERE (8-10): excruciating pain, unable to do any normal activities, unable to walk     8-10/10. Begins 5-10 minutes after walking, no pain at rest. 4. WORK OR EXERCISE: "Has there been any recent work or exercise that involved this part of the body?"      No 5. CAUSE: "What do you think is causing the leg pain?"     Unsure 6. OTHER SYMPTOMS: "Do you have any other symptoms?" (e.g., chest pain, back pain, breathing difficulty, swelling, rash, fever, numbness, weakness)     H/O lymphedema, States leg looks "as it usually does, mild swelling and redness is normal." H/O chronic back pain.  Protocols used: LEG PAIN-A-AH

## 2017-04-09 ENCOUNTER — Ambulatory Visit: Payer: Self-pay | Admitting: Family Medicine

## 2017-04-13 ENCOUNTER — Other Ambulatory Visit: Payer: Self-pay | Admitting: Internal Medicine

## 2017-04-24 ENCOUNTER — Ambulatory Visit (INDEPENDENT_AMBULATORY_CARE_PROVIDER_SITE_OTHER): Payer: Medicare HMO | Admitting: *Deleted

## 2017-04-24 DIAGNOSIS — E538 Deficiency of other specified B group vitamins: Secondary | ICD-10-CM

## 2017-04-24 MED ORDER — CYANOCOBALAMIN 1000 MCG/ML IJ SOLN
1000.0000 ug | Freq: Once | INTRAMUSCULAR | Status: AC
  Administered 2017-04-24: 1000 ug via INTRAMUSCULAR

## 2017-04-24 NOTE — Progress Notes (Addendum)
Patient presented for B 12 injection to right deltoid, patient voiced no concerns nor showed any signs of distress during injection.  Reviewed.  Dr Scott 

## 2017-05-29 DIAGNOSIS — R69 Illness, unspecified: Secondary | ICD-10-CM | POA: Diagnosis not present

## 2017-06-11 ENCOUNTER — Other Ambulatory Visit: Payer: Self-pay | Admitting: Internal Medicine

## 2017-06-12 ENCOUNTER — Ambulatory Visit (INDEPENDENT_AMBULATORY_CARE_PROVIDER_SITE_OTHER): Payer: Medicare HMO

## 2017-06-12 DIAGNOSIS — E538 Deficiency of other specified B group vitamins: Secondary | ICD-10-CM

## 2017-06-12 MED ORDER — CYANOCOBALAMIN 1000 MCG/ML IJ SOLN
1000.0000 ug | Freq: Once | INTRAMUSCULAR | Status: AC
  Administered 2017-06-12: 1000 ug via INTRAMUSCULAR

## 2017-06-12 NOTE — Progress Notes (Addendum)
Patient comes in today for a Vitamin B 12. Administered in left Deltoid IM. Patient tolerated well.  Reviewed.  Dr Lorin PicketScott

## 2017-06-17 DIAGNOSIS — H2513 Age-related nuclear cataract, bilateral: Secondary | ICD-10-CM | POA: Diagnosis not present

## 2017-06-17 LAB — HM DIABETES EYE EXAM

## 2017-06-25 DIAGNOSIS — K529 Noninfective gastroenteritis and colitis, unspecified: Secondary | ICD-10-CM | POA: Diagnosis not present

## 2017-06-27 ENCOUNTER — Other Ambulatory Visit: Payer: Self-pay

## 2017-07-01 ENCOUNTER — Other Ambulatory Visit (INDEPENDENT_AMBULATORY_CARE_PROVIDER_SITE_OTHER): Payer: Medicare HMO

## 2017-07-01 DIAGNOSIS — Z794 Long term (current) use of insulin: Secondary | ICD-10-CM

## 2017-07-01 DIAGNOSIS — E785 Hyperlipidemia, unspecified: Secondary | ICD-10-CM

## 2017-07-01 DIAGNOSIS — I10 Essential (primary) hypertension: Secondary | ICD-10-CM | POA: Diagnosis not present

## 2017-07-01 DIAGNOSIS — E1140 Type 2 diabetes mellitus with diabetic neuropathy, unspecified: Secondary | ICD-10-CM

## 2017-07-01 LAB — BASIC METABOLIC PANEL
BUN: 24 mg/dL — ABNORMAL HIGH (ref 6–23)
CO2: 29 mEq/L (ref 19–32)
Calcium: 9.4 mg/dL (ref 8.4–10.5)
Chloride: 104 mEq/L (ref 96–112)
Creatinine, Ser: 0.89 mg/dL (ref 0.40–1.20)
GFR: 66.12 mL/min (ref >60.00)
Glucose, Bld: 95 mg/dL (ref 70–99)
POTASSIUM: 5 meq/L (ref 3.5–5.1)
Sodium: 141 mEq/L (ref 135–145)

## 2017-07-01 LAB — HEPATIC FUNCTION PANEL
ALK PHOS: 81 U/L (ref 39–117)
ALT: 20 U/L (ref 0–35)
AST: 19 U/L (ref 0–37)
Albumin: 4.2 g/dL (ref 3.5–5.2)
BILIRUBIN DIRECT: 0 mg/dL (ref 0.0–0.3)
BILIRUBIN TOTAL: 0.4 mg/dL (ref 0.2–1.2)
Total Protein: 7.4 g/dL (ref 6.0–8.3)

## 2017-07-01 LAB — LIPID PANEL
Cholesterol: 125 mg/dL (ref 0–200)
HDL: 48.3 mg/dL (ref >39.00)
LDL CALC: 54 mg/dL (ref 0–99)
NONHDL: 77.18
Total CHOL/HDL Ratio: 3
Triglycerides: 117 mg/dL (ref 0.0–149.0)
VLDL: 23.4 mg/dL (ref 0.0–40.0)

## 2017-07-01 LAB — HEMOGLOBIN A1C: HEMOGLOBIN A1C: 7.4 % — AB (ref 4.6–6.5)

## 2017-07-02 ENCOUNTER — Encounter: Payer: Self-pay | Admitting: Internal Medicine

## 2017-07-03 ENCOUNTER — Ambulatory Visit (INDEPENDENT_AMBULATORY_CARE_PROVIDER_SITE_OTHER): Payer: Medicare HMO | Admitting: Internal Medicine

## 2017-07-03 VITALS — BP 136/78 | HR 59 | Temp 97.9°F | Resp 18 | Wt 240.0 lb

## 2017-07-03 DIAGNOSIS — R0602 Shortness of breath: Secondary | ICD-10-CM | POA: Diagnosis not present

## 2017-07-03 DIAGNOSIS — R600 Localized edema: Secondary | ICD-10-CM

## 2017-07-03 DIAGNOSIS — F439 Reaction to severe stress, unspecified: Secondary | ICD-10-CM | POA: Diagnosis not present

## 2017-07-03 DIAGNOSIS — I10 Essential (primary) hypertension: Secondary | ICD-10-CM | POA: Diagnosis not present

## 2017-07-03 DIAGNOSIS — K589 Irritable bowel syndrome without diarrhea: Secondary | ICD-10-CM

## 2017-07-03 DIAGNOSIS — E538 Deficiency of other specified B group vitamins: Secondary | ICD-10-CM | POA: Diagnosis not present

## 2017-07-03 DIAGNOSIS — E785 Hyperlipidemia, unspecified: Secondary | ICD-10-CM

## 2017-07-03 DIAGNOSIS — R200 Anesthesia of skin: Secondary | ICD-10-CM

## 2017-07-03 DIAGNOSIS — Z794 Long term (current) use of insulin: Secondary | ICD-10-CM

## 2017-07-03 DIAGNOSIS — I700 Atherosclerosis of aorta: Secondary | ICD-10-CM | POA: Diagnosis not present

## 2017-07-03 DIAGNOSIS — Z6841 Body Mass Index (BMI) 40.0 and over, adult: Secondary | ICD-10-CM

## 2017-07-03 DIAGNOSIS — H60501 Unspecified acute noninfective otitis externa, right ear: Secondary | ICD-10-CM

## 2017-07-03 DIAGNOSIS — I890 Lymphedema, not elsewhere classified: Secondary | ICD-10-CM | POA: Diagnosis not present

## 2017-07-03 DIAGNOSIS — E1140 Type 2 diabetes mellitus with diabetic neuropathy, unspecified: Secondary | ICD-10-CM

## 2017-07-03 MED ORDER — LEVOFLOXACIN 500 MG PO TABS: 500.0000 mg | ORAL_TABLET | Freq: Every day | ORAL | 0 refills | Status: DC

## 2017-07-03 NOTE — Progress Notes (Signed)
Patient ID: DAVIDA FALCONI, female   DOB: April 19, 1945, 72 y.o.   MRN: [id number]   Subjective:    Patient ID: [name], female    DOB: [birth year], 72 y.o.   MRN: [id number]  HPI  Patient here for a scheduled follow up.  She reports that her sugars are averaging 110-115 in the am and 90-150 in the pm and <200 at night.  Trying to watch her diet.  Taking her medications regularly.  Walking with walker.  Has noticed being more sob with exertion.   Does report persistent numbness in her hands and arms.  Wearing splints.  Still with issues.  No significant neck pain.  No headache.  No increased abdominal pain.  Bowels moving - stable.  She is having problems with ear swelling and discomfort.  Better today.  States flares intermittently.  No fever.     Past Medical History:  Diagnosis Date  . Anemia   . Arthritis   . B12 deficiency   . Diabetes mellitus ([place]) 02/17/2012  . Diabetes mellitus, type II ([place])   . Gastritis   . GERD (gastroesophageal reflux disease)   . Hypercholesterolemia   . Hypertension   . IBS (irritable bowel syndrome)   . Pernicious anemia    Past Surgical History:  Procedure Laterality Date  . BREAST BIOPSY Right 1980   neg  . BREAST SURGERY  1980   benign cyst removal  . CHOLECYSTECTOMY  1983   Family History  Problem Relation Age of Onset  . Diabetes Father   . Kidney disease Father   . Heart disease Father   . Alcohol abuse Mother   . Dementia Mother   . Diabetes Mother   . Diabetes Other   . Heart disease Son 53       CABG  . Hemophilia Brother   . Uterine cancer Sister   . Diabetes Sister   . COPD Sister   . Breast cancer Maternal Aunt 51  . Breast cancer Daughter 40   Social History   Socioeconomic History  . Marital status: Married    Spouse name: Not on file  . Number of children: 4  . Years of education: Not on file  . Highest education level: Not on file  Occupational History  . Not on file  Social Needs  . Financial resource  strain: Not on file  . Food insecurity:    Worry: Not on file    Inability: Not on file  . Transportation needs:    Medical: Not on file    Non-medical: Not on file  Tobacco Use  . Smoking status: Never Smoker  . Smokeless tobacco: Never Used  Substance and Sexual Activity  . Alcohol use: No    Alcohol/week: 0.0 oz  . Drug use: No  . Sexual activity: Not Currently  Lifestyle  . Physical activity:    Days per week: Not on file    Minutes per session: Not on file  . Stress: Not on file  Relationships  . Social connections:    Talks on phone: Not on file    Gets together: Not on file    Attends religious service: Not on file    Active member of club or organization: Not on file    Attends meetings of clubs or organizations: Not on file    Relationship status: Not on file  Other Topics Concern  . Not on file  Social History Narrative   Lives with her husband.  Has four children. Manager of an apartment building. No tobacco, alcohol or other drug use    Outpatient Encounter Medications as of 07/03/2017  Medication Sig  . colestipol (COLESTID) 1 g tablet Take by mouth.  Marland Kitchen amLODipine (NORVASC) 10 MG tablet TAKE 1 TABLET BY MOUTH ONCE DAILY  . aspirin 81 MG tablet Take 81 mg by mouth daily.  Marland Kitchen atenolol (TENORMIN) 100 MG tablet TAKE ONE TABLET BY MOUTH IN THE MORNING AND  TAKE  1/2  TABLET  IN  THE  EVENING  . benzonatate (TESSALON) 100 MG capsule Take 1 capsule (100 mg total) by mouth 3 (three) times daily as needed for cough.  . ferrous fumarate (HEMOCYTE - 106 MG FE) 325 (106 FE) MG TABS tablet Take 1 tablet by mouth.  . fluticasone (CUTIVATE) 0.05 % cream USE AS DIRECTED  . glipiZIDE (GLUCOTROL XL) 5 MG 24 hr tablet TAKE ONE TABLET BY MOUTH ONCE DAILY WITH BREAKFAST  . hydrochlorothiazide (HYDRODIURIL) 25 MG tablet TAKE ONE TABLET BY MOUTH ONCE DAILY  . Insulin Detemir (LEVEMIR) 100 UNIT/ML Pen Inject 24 units in the morning & 12 units at bedtime  . Insulin Pen Needle 32G X 8  MM MISC Use as directed three times daily Dx E11.9  . levofloxacin (LEVAQUIN) 500 MG tablet Take 1 tablet (500 mg total) by mouth daily.  Marland Kitchen lisinopril (PRINIVIL,ZESTRIL) 20 MG tablet TAKE 1 TABLET BY MOUTH IN THE MORNING AND 2 AT BEDTIME  . magnesium oxide (MAG-OX) 400 (241.3 Mg) MG tablet TAKE ONE TABLET BY MOUTH ONCE DAILY  . metFORMIN (GLUCOPHAGE) 1000 MG tablet TAKE 1 TABLET BY MOUTH TWICE DAILY WITH  MEALS  . pantoprazole (PROTONIX) 40 MG tablet Take 1 tablet (40 mg total) by mouth 2 (two) times daily before a meal.  . pioglitazone (ACTOS) 30 MG tablet TAKE 1 TABLET BY MOUTH ONCE DAILY  . polyethylene glycol-electrolytes (NULYTELY/GOLYTELY) 420 g solution Take by mouth.  . simvastatin (ZOCOR) 10 MG tablet TAKE 1 TABLET BY MOUTH AT BEDTIME  . triamcinolone cream (KENALOG) 0.1 % APPLY  CREAM EXTERNALLY TWICE DAILY   No facility-administered encounter medications on file as of 07/03/2017.     Review of Systems  Constitutional: Negative for appetite change and unexpected weight change.  HENT: Negative for congestion and sinus pressure.   Respiratory: Positive for shortness of breath. Negative for cough and chest tightness.   Cardiovascular: Negative for chest pain, palpitations and leg swelling.  Gastrointestinal: Negative for nausea and vomiting.       Bowel issues stable.  No vomiting.   Genitourinary: Negative for difficulty urinating and dysuria.  Musculoskeletal: Negative for joint swelling and myalgias.  Skin: Negative for color change and rash.  Neurological: Negative for dizziness, light-headedness and headaches.  Psychiatric/Behavioral: Negative for agitation and dysphoric mood.       Objective:     Blood pressure rechecked by me:  136/78  Physical Exam  Constitutional: She appears well-developed and well-nourished. No distress.  HENT:  Nose: Nose normal.  Mouth/Throat: Oropharynx is clear and moist.  Ear - minimal erythema and swelling of right external ear.  TMs  without erythema.  Canal - clear.    Neck: Neck supple. No thyromegaly present.  Cardiovascular: Normal rate and regular rhythm.  Pulmonary/Chest: Breath sounds normal. No respiratory distress. She has no wheezes.  Abdominal: Soft. Bowel sounds are normal. There is no tenderness.  Musculoskeletal: She exhibits no edema or tenderness.  Lymphadenopathy:    She has no cervical adenopathy.  Skin: No rash noted. No erythema.  Psychiatric: She has a normal mood and affect. Her behavior is normal.    BP 136/78   Pulse (!) 59   Temp 97.9 F (36.6 C) (Oral)   Resp 18   Wt 240 lb (108.9 kg)   SpO2 99%   BMI 45.35 kg/m  Wt Readings from Last 3 Encounters:  07/03/17 240 lb (108.9 kg)  03/04/17 234 lb (106.1 kg)  12/17/16 237 lb 6.4 oz (107.7 kg)     Lab Results  Component Value Date   WBC 7.5 10/03/2016   HGB 13.5 10/03/2016   HCT 40.3 10/03/2016   PLT 252.0 10/03/2016   GLUCOSE 95 07/01/2017   CHOL 125 07/01/2017   TRIG 117.0 07/01/2017   HDL 48.30 07/01/2017   LDLDIRECT 74.4 02/21/2012   LDLCALC 54 07/01/2017   ALT 20 07/01/2017   AST 19 07/01/2017   NA 141 07/01/2017   K 5.0 07/01/2017   CL 104 07/01/2017   CREATININE 0.89 07/01/2017   BUN 24 (H) 07/01/2017   CO2 29 07/01/2017   TSH 2.75 10/03/2016   HGBA1C 7.4 (H) 07/01/2017   MICROALBUR 3.8 (H) 02/26/2017    Mm Digital Screening Bilateral  Result Date: 04/03/2017 CLINICAL DATA:  Screening. EXAM: DIGITAL SCREENING BILATERAL MAMMOGRAM WITH CAD COMPARISON:  Previous exam(s). ACR Breast Density Category b: There are scattered areas of fibroglandular density. FINDINGS: There are no findings suspicious for malignancy. Images were processed with CAD. IMPRESSION: No mammographic evidence of malignancy. A result letter of this screening mammogram will be mailed directly to the patient. RECOMMENDATION: Screening mammogram in one year. (Code:SM-B-01Y) BI-RADS CATEGORY  1: Negative. Electronically Signed   By: Margarette Canada M.D.    On: 04/03/2017 16:49       Assessment & Plan:   Problem List Items Addressed This Visit    Aortic atherosclerosis ([place])    On simvastatin.        Relevant Medications   colestipol (COLESTID) 1 g tablet   Arm numbness    Arm numbness as outlined.  Persistent despite wearing splints.  Schedule for NCS.        Relevant Orders   Ambulatory referral to Neurology   B12 deficiency    Continue b12 injections.        Bilateral leg edema    Has seen [facility].  Swelling has been better.  Follow.        BMI 40.0-44.9, adult (HCC)    Discussed diet and exercise.  Follow.        Diabetes mellitus ([place])    Low carb diet and exercise.  Sugars as outlined.  a1c increased some.  Discussed diet and exercise.  Hold on making changes.  Follow met b and a1c.        Relevant Orders   Hemoglobin A1c   Hyperlipidemia    On simvastatin.  Low cholesterol diet and exercise.  Follow lipid panel and liver function tests.        Relevant Medications   colestipol (COLESTID) 1 g tablet   Other Relevant Orders   Hepatic function panel   Lipid panel   Hypertension    Blood pressure on recheck improved.  Same medication regimen.  Follow pressures.  Follow metabolic panel.        Relevant Medications   colestipol (COLESTID) 1 g tablet   Other Relevant Orders   CBC with Differential/Platelet   TSH   Basic metabolic panel   IBS (irritable bowel  syndrome)    Seeing GI.  Stable.       Lymphedema    Being followed by [facility].       Otitis externa    Minimal erythema and swelling.  levaquin as directed.  Call with update over the next few days.  Follow closely.        SOB (shortness of breath)    SOB with exertion.  EKG - SR/SB with no acute ischemic changes.  Given symptoms and risk factors, will refer to cardiology for further evaluation.        Stress    Overall handling things relatively well.  Follow.         Other Visit Diagnoses    Shortness of breath    -  Primary   Relevant  Orders   EKG 12-Lead (Completed)   Ambulatory referral to Cardiology       Einar Pheasant, MD

## 2017-07-05 DIAGNOSIS — R69 Illness, unspecified: Secondary | ICD-10-CM | POA: Diagnosis not present

## 2017-07-06 ENCOUNTER — Encounter: Payer: Self-pay | Admitting: Internal Medicine

## 2017-07-06 DIAGNOSIS — R200 Anesthesia of skin: Secondary | ICD-10-CM | POA: Insufficient documentation

## 2017-07-06 DIAGNOSIS — H6090 Unspecified otitis externa, unspecified ear: Secondary | ICD-10-CM | POA: Insufficient documentation

## 2017-07-06 NOTE — Assessment & Plan Note (Signed)
Seeing GI.  Stable.

## 2017-07-06 NOTE — Assessment & Plan Note (Signed)
Arm numbness as outlined.  Persistent despite wearing splints.  Schedule for NCS.

## 2017-07-06 NOTE — Assessment & Plan Note (Signed)
Low carb diet and exercise.  Sugars as outlined.  a1c increased some.  Discussed diet and exercise.  Hold on making changes.  Follow met b and a1c.

## 2017-07-06 NOTE — Assessment & Plan Note (Signed)
Minimal erythema and swelling.  levaquin as directed.  Call with update over the next few days.  Follow closely.

## 2017-07-06 NOTE — Assessment & Plan Note (Signed)
Blood pressure on recheck improved.  Same medication regimen.  Follow pressures.  Follow metabolic panel.   

## 2017-07-06 NOTE — Assessment & Plan Note (Signed)
Has seen [facility].  Swelling has been better.  Follow.

## 2017-07-06 NOTE — Assessment & Plan Note (Signed)
Continue b12 injections.  

## 2017-07-06 NOTE — Assessment & Plan Note (Signed)
On simvastatin.  Low cholesterol diet and exercise.  Follow lipid panel and liver function tests.   

## 2017-07-06 NOTE — Assessment & Plan Note (Signed)
Being followed by [facility].   

## 2017-07-06 NOTE — Assessment & Plan Note (Signed)
Overall handling things relatively well.  Follow.  

## 2017-07-06 NOTE — Assessment & Plan Note (Signed)
On simvastatin.   

## 2017-07-06 NOTE — Assessment & Plan Note (Signed)
Discussed diet and exercise.  Follow.  

## 2017-07-06 NOTE — Assessment & Plan Note (Signed)
SOB with exertion.  EKG - SR/SB with no acute ischemic changes.  Given symptoms and risk factors, will refer to cardiology for further evaluation.

## 2017-07-07 DIAGNOSIS — R69 Illness, unspecified: Secondary | ICD-10-CM | POA: Diagnosis not present

## 2017-07-08 ENCOUNTER — Encounter (INDEPENDENT_AMBULATORY_CARE_PROVIDER_SITE_OTHER): Payer: Self-pay

## 2017-07-10 ENCOUNTER — Other Ambulatory Visit: Payer: Self-pay | Admitting: Internal Medicine

## 2017-07-15 ENCOUNTER — Encounter: Payer: Self-pay | Admitting: Internal Medicine

## 2017-07-16 ENCOUNTER — Ambulatory Visit: Payer: Self-pay

## 2017-07-16 ENCOUNTER — Telehealth: Payer: Self-pay | Admitting: *Deleted

## 2017-07-16 NOTE — Telephone Encounter (Signed)
Noted    Copied from CRM [id number]. Topic: Quick Communication - Appointment Cancellation >> Jul 16, 2017 12:16 PM Cecelia ByarsGreen, Temeka L, RMA wrote: Patient called to cancel appointment scheduled for 07/16/17 @ 2:00 pm. Patient has rescheduled their appointment for 07/24/17 @ 3:00 pm

## 2017-07-21 DIAGNOSIS — G5602 Carpal tunnel syndrome, left upper limb: Secondary | ICD-10-CM | POA: Diagnosis not present

## 2017-07-21 DIAGNOSIS — R202 Paresthesia of skin: Secondary | ICD-10-CM | POA: Diagnosis not present

## 2017-07-21 DIAGNOSIS — R200 Anesthesia of skin: Secondary | ICD-10-CM | POA: Diagnosis not present

## 2017-07-24 ENCOUNTER — Ambulatory Visit (INDEPENDENT_AMBULATORY_CARE_PROVIDER_SITE_OTHER): Payer: Medicare HMO

## 2017-07-24 DIAGNOSIS — E538 Deficiency of other specified B group vitamins: Secondary | ICD-10-CM

## 2017-07-24 MED ORDER — CYANOCOBALAMIN 1000 MCG/ML IJ SOLN
1000.0000 ug | Freq: Once | INTRAMUSCULAR | Status: AC
  Administered 2017-07-24: 1000 ug via INTRAMUSCULAR

## 2017-07-24 NOTE — Progress Notes (Addendum)
Patient presents today for B12 injection. Right deltoid. Pt voiced no concerns nor showed any signs of distress during the injection.   Reviewed.  Dr Lorin PicketScott

## 2017-07-30 ENCOUNTER — Other Ambulatory Visit: Payer: Self-pay | Admitting: Internal Medicine

## 2017-08-01 ENCOUNTER — Telehealth: Payer: Self-pay

## 2017-08-01 DIAGNOSIS — H2513 Age-related nuclear cataract, bilateral: Secondary | ICD-10-CM | POA: Diagnosis not present

## 2017-08-01 NOTE — Telephone Encounter (Signed)
[facility] [place]  Copied from CRM [phone]. Topic: Inquiry >> Aug 01, 2017  1:43 PM Alexander BergeronBarksdale, Harvey B wrote: Reason for CRM: Dr. Laurena SlimmerParrish's office called and are needing the last to EKG readings faxed over to the office, fax: [phone]

## 2017-08-04 DIAGNOSIS — R0602 Shortness of breath: Secondary | ICD-10-CM | POA: Diagnosis not present

## 2017-08-04 DIAGNOSIS — I890 Lymphedema, not elsewhere classified: Secondary | ICD-10-CM | POA: Diagnosis not present

## 2017-08-04 DIAGNOSIS — I10 Essential (primary) hypertension: Secondary | ICD-10-CM | POA: Diagnosis not present

## 2017-08-11 DIAGNOSIS — K529 Noninfective gastroenteritis and colitis, unspecified: Secondary | ICD-10-CM | POA: Diagnosis not present

## 2017-08-11 DIAGNOSIS — K8681 Exocrine pancreatic insufficiency: Secondary | ICD-10-CM | POA: Diagnosis not present

## 2017-08-11 DIAGNOSIS — K580 Irritable bowel syndrome with diarrhea: Secondary | ICD-10-CM | POA: Diagnosis not present

## 2017-08-11 DIAGNOSIS — Z6841 Body Mass Index (BMI) 40.0 and over, adult: Secondary | ICD-10-CM | POA: Diagnosis not present

## 2017-08-13 DIAGNOSIS — R0602 Shortness of breath: Secondary | ICD-10-CM | POA: Diagnosis not present

## 2017-08-26 DIAGNOSIS — R0602 Shortness of breath: Secondary | ICD-10-CM | POA: Diagnosis not present

## 2017-08-26 DIAGNOSIS — I890 Lymphedema, not elsewhere classified: Secondary | ICD-10-CM | POA: Diagnosis not present

## 2017-08-27 ENCOUNTER — Ambulatory Visit (INDEPENDENT_AMBULATORY_CARE_PROVIDER_SITE_OTHER): Payer: Medicare HMO | Admitting: *Deleted

## 2017-08-27 DIAGNOSIS — E538 Deficiency of other specified B group vitamins: Secondary | ICD-10-CM

## 2017-08-27 MED ORDER — CYANOCOBALAMIN 1000 MCG/ML IJ SOLN
1000.0000 ug | Freq: Once | INTRAMUSCULAR | Status: AC
  Administered 2017-08-27: 1000 ug via INTRAMUSCULAR

## 2017-08-27 NOTE — Progress Notes (Signed)
Patient presented for B 12 injection to left deltoid, patient voiced no concerns nor showed any signs of distress during injection. 

## 2017-09-01 NOTE — Progress Notes (Signed)
  I have reviewed the above information and agree with above.   Terrance Lanahan, MD 

## 2017-09-13 ENCOUNTER — Other Ambulatory Visit: Payer: Self-pay | Admitting: Internal Medicine

## 2017-09-25 ENCOUNTER — Other Ambulatory Visit: Payer: Self-pay | Admitting: Internal Medicine

## 2017-09-30 DIAGNOSIS — H2512 Age-related nuclear cataract, left eye: Secondary | ICD-10-CM | POA: Diagnosis not present

## 2017-10-01 ENCOUNTER — Ambulatory Visit (INDEPENDENT_AMBULATORY_CARE_PROVIDER_SITE_OTHER): Payer: Medicare HMO

## 2017-10-01 DIAGNOSIS — E538 Deficiency of other specified B group vitamins: Secondary | ICD-10-CM

## 2017-10-01 MED ORDER — CYANOCOBALAMIN 1000 MCG/ML IJ SOLN
1000.0000 ug | Freq: Once | INTRAMUSCULAR | Status: AC
  Administered 2017-10-01: 1000 ug via INTRAMUSCULAR

## 2017-10-01 NOTE — Progress Notes (Addendum)
Patient presents today for B12 injection. Left deltoid. Pt voiced no concerns nor showed any signs of distress during the injection.   Reviewed.  Dr Lorin PicketScott

## 2017-10-02 ENCOUNTER — Encounter: Payer: Self-pay | Admitting: *Deleted

## 2017-10-02 ENCOUNTER — Other Ambulatory Visit: Payer: Self-pay

## 2017-10-03 NOTE — Anesthesia Preprocedure Evaluation (Addendum)
Anesthesia Evaluation  Patient identified by MRN, date of birth, ID band Patient awake    Reviewed: Allergy & Precautions, NPO status , Patient's Chart, lab work & pertinent test results, reviewed documented beta blocker date and time   Airway Mallampati: II  TM Distance: >3 FB Neck ROM: Full    Dental no notable dental hx.    Pulmonary shortness of breath,    Pulmonary exam normal breath sounds clear to auscultation       Cardiovascular hypertension, + Peripheral Vascular Disease  Normal cardiovascular exam Rhythm:Regular Rate:Normal  Normal EF 2019 with mild-moderate PHTN, TR   Neuro/Psych  Neuromuscular disease    GI/Hepatic GERD  ,  Endo/Other  diabetes, Type 2Morbid obesity  Renal/GU      Musculoskeletal  (+) Arthritis ,   Abdominal (+) + obese,   Peds  Hematology  (+) anemia ,   Anesthesia Other Findings   Reproductive/Obstetrics                           Anesthesia Physical Anesthesia Plan  ASA: III  Anesthesia Plan: MAC   Post-op Pain Management:    Induction: Intravenous  PONV Risk Score and Plan:   Airway Management Planned: Natural Airway  Additional Equipment: None  Intra-op Plan:   Post-operative Plan:   Informed Consent: I have reviewed the patients History and Physical, chart, labs and discussed the procedure including the risks, benefits and alternatives for the proposed anesthesia with the patient or authorized representative who has indicated his/her understanding and acceptance.     Plan Discussed with: CRNA, Anesthesiologist and Surgeon  Anesthesia Plan Comments:         Anesthesia Quick Evaluation

## 2017-10-06 ENCOUNTER — Other Ambulatory Visit: Payer: Self-pay | Admitting: Internal Medicine

## 2017-10-06 ENCOUNTER — Other Ambulatory Visit (INDEPENDENT_AMBULATORY_CARE_PROVIDER_SITE_OTHER): Payer: Medicare HMO

## 2017-10-06 DIAGNOSIS — Z794 Long term (current) use of insulin: Secondary | ICD-10-CM

## 2017-10-06 DIAGNOSIS — E785 Hyperlipidemia, unspecified: Secondary | ICD-10-CM | POA: Diagnosis not present

## 2017-10-06 DIAGNOSIS — D649 Anemia, unspecified: Secondary | ICD-10-CM

## 2017-10-06 DIAGNOSIS — E1140 Type 2 diabetes mellitus with diabetic neuropathy, unspecified: Secondary | ICD-10-CM

## 2017-10-06 DIAGNOSIS — I10 Essential (primary) hypertension: Secondary | ICD-10-CM

## 2017-10-06 LAB — LIPID PANEL
CHOLESTEROL: 102 mg/dL (ref 0–200)
HDL: 55.8 mg/dL (ref >39.00)
LDL CALC: 31 mg/dL (ref 0–99)
NonHDL: 46.44
TRIGLYCERIDES: 76 mg/dL (ref 0.0–149.0)
Total CHOL/HDL Ratio: 2
VLDL: 15.2 mg/dL (ref 0.0–40.0)

## 2017-10-06 LAB — CBC WITH DIFFERENTIAL/PLATELET
BASOS ABS: 0 10*3/uL (ref 0.0–0.1)
Basophils Relative: 0.7 % (ref 0.0–3.0)
EOS ABS: 0.3 10*3/uL (ref 0.0–0.7)
Eosinophils Relative: 4.7 % (ref 0.0–5.0)
HCT: 35.1 % — ABNORMAL LOW (ref 36.0–46.0)
Hemoglobin: 11.7 g/dL — ABNORMAL LOW (ref 12.0–15.0)
Lymphocytes Relative: 31.6 % (ref 12.0–46.0)
Lymphs Abs: 2.3 10*3/uL (ref 0.7–4.0)
MCHC: 33.3 g/dL (ref 30.0–36.0)
MCV: 89.9 fl (ref 78.0–100.0)
MONO ABS: 0.6 10*3/uL (ref 0.1–1.0)
Monocytes Relative: 8.7 % (ref 3.0–12.0)
Neutro Abs: 3.9 10*3/uL (ref 1.4–7.7)
Neutrophils Relative %: 54.3 % (ref 43.0–77.0)
Platelets: 247 10*3/uL (ref 150.0–400.0)
RBC: 3.9 Mil/uL (ref 3.87–5.11)
RDW: 13.7 % (ref 11.5–15.5)
WBC: 7.2 10*3/uL (ref 4.0–10.5)

## 2017-10-06 LAB — HEPATIC FUNCTION PANEL
ALBUMIN: 4.3 g/dL (ref 3.5–5.2)
ALT: 18 U/L (ref 0–35)
AST: 21 U/L (ref 0–37)
Alkaline Phosphatase: 70 U/L (ref 39–117)
BILIRUBIN TOTAL: 0.5 mg/dL (ref 0.2–1.2)
Bilirubin, Direct: 0.2 mg/dL (ref 0.0–0.3)
TOTAL PROTEIN: 7 g/dL (ref 6.0–8.3)

## 2017-10-06 LAB — BASIC METABOLIC PANEL
BUN: 19 mg/dL (ref 6–23)
CHLORIDE: 105 meq/L (ref 96–112)
CO2: 30 meq/L (ref 19–32)
CREATININE: 0.89 mg/dL (ref 0.40–1.20)
Calcium: 9.3 mg/dL (ref 8.4–10.5)
GFR: 66.07 mL/min (ref >60.00)
Glucose, Bld: 99 mg/dL (ref 70–99)
Potassium: 4.7 mEq/L (ref 3.5–5.1)
Sodium: 142 mEq/L (ref 135–145)

## 2017-10-06 LAB — TSH: TSH: 2.32 u[IU]/mL (ref 0.35–4.50)

## 2017-10-06 LAB — HEMOGLOBIN A1C: HEMOGLOBIN A1C: 7.5 % — AB (ref 4.6–6.5)

## 2017-10-06 NOTE — Progress Notes (Signed)
Orders placed for f/u labs.  

## 2017-10-06 NOTE — Discharge Instructions (Signed)

## 2017-10-07 ENCOUNTER — Other Ambulatory Visit (INDEPENDENT_AMBULATORY_CARE_PROVIDER_SITE_OTHER): Payer: Medicare HMO

## 2017-10-07 DIAGNOSIS — D649 Anemia, unspecified: Secondary | ICD-10-CM | POA: Diagnosis not present

## 2017-10-07 LAB — IBC PANEL
Iron: 71 ug/dL (ref 42–145)
Saturation Ratios: 18.9 % — ABNORMAL LOW (ref 20.0–50.0)
Transferrin: 269 mg/dL (ref 212.0–360.0)

## 2017-10-07 LAB — VITAMIN B12: VITAMIN B 12: 886 pg/mL (ref 211–911)

## 2017-10-07 LAB — FERRITIN: FERRITIN: 84.7 ng/mL (ref 10.0–291.0)

## 2017-10-10 ENCOUNTER — Ambulatory Visit (INDEPENDENT_AMBULATORY_CARE_PROVIDER_SITE_OTHER): Payer: Medicare HMO | Admitting: Internal Medicine

## 2017-10-10 ENCOUNTER — Encounter: Payer: Self-pay | Admitting: Internal Medicine

## 2017-10-10 ENCOUNTER — Ambulatory Visit (INDEPENDENT_AMBULATORY_CARE_PROVIDER_SITE_OTHER): Payer: Medicare HMO

## 2017-10-10 VITALS — BP 130/70 | HR 58 | Temp 98.2°F | Resp 18 | Wt 244.6 lb

## 2017-10-10 DIAGNOSIS — Z794 Long term (current) use of insulin: Secondary | ICD-10-CM | POA: Diagnosis not present

## 2017-10-10 DIAGNOSIS — E785 Hyperlipidemia, unspecified: Secondary | ICD-10-CM | POA: Diagnosis not present

## 2017-10-10 DIAGNOSIS — I890 Lymphedema, not elsewhere classified: Secondary | ICD-10-CM | POA: Diagnosis not present

## 2017-10-10 DIAGNOSIS — R600 Localized edema: Secondary | ICD-10-CM | POA: Diagnosis not present

## 2017-10-10 DIAGNOSIS — R0602 Shortness of breath: Secondary | ICD-10-CM

## 2017-10-10 DIAGNOSIS — E1140 Type 2 diabetes mellitus with diabetic neuropathy, unspecified: Secondary | ICD-10-CM

## 2017-10-10 DIAGNOSIS — D649 Anemia, unspecified: Secondary | ICD-10-CM

## 2017-10-10 DIAGNOSIS — Z6841 Body Mass Index (BMI) 40.0 and over, adult: Secondary | ICD-10-CM

## 2017-10-10 DIAGNOSIS — K589 Irritable bowel syndrome without diarrhea: Secondary | ICD-10-CM

## 2017-10-10 DIAGNOSIS — E538 Deficiency of other specified B group vitamins: Secondary | ICD-10-CM

## 2017-10-10 DIAGNOSIS — I700 Atherosclerosis of aorta: Secondary | ICD-10-CM

## 2017-10-10 DIAGNOSIS — I10 Essential (primary) hypertension: Secondary | ICD-10-CM

## 2017-10-10 LAB — CBC WITH DIFFERENTIAL/PLATELET
BASOS PCT: 0.9 % (ref 0.0–3.0)
Basophils Absolute: 0.1 10*3/uL (ref 0.0–0.1)
EOS PCT: 4.7 % (ref 0.0–5.0)
Eosinophils Absolute: 0.4 10*3/uL (ref 0.0–0.7)
HCT: 37.8 % (ref 36.0–46.0)
Hemoglobin: 12.4 g/dL (ref 12.0–15.0)
LYMPHS ABS: 2.3 10*3/uL (ref 0.7–4.0)
LYMPHS PCT: 28.1 % (ref 12.0–46.0)
MCHC: 32.9 g/dL (ref 30.0–36.0)
MCV: 90.5 fl (ref 78.0–100.0)
Monocytes Absolute: 0.9 10*3/uL (ref 0.1–1.0)
Monocytes Relative: 10.3 % (ref 3.0–12.0)
NEUTROS ABS: 4.6 10*3/uL (ref 1.4–7.7)
NEUTROS PCT: 56 % (ref 43.0–77.0)
Platelets: 234 10*3/uL (ref 150.0–400.0)
RBC: 4.18 Mil/uL (ref 3.87–5.11)
RDW: 13.5 % (ref 11.5–15.5)
WBC: 8.2 10*3/uL (ref 4.0–10.5)

## 2017-10-10 NOTE — Progress Notes (Signed)
Patient ID: Beth Tyler, female   DOB: 06-05-1945, 72 y.o.   MRN: [id number]   Subjective:    Patient ID: Beth Tyler, female    DOB: [birth year], 72 y.o.   MRN: [id number]  HPI  Patient here for a scheduled follow up.  She was just recently evaluated by cardiology for exertional dyspnea and peripheral edema.  ECHO 08/13/17 - normal LV function with EF >55%, with mild to moderate TR.  Recommended continuingcurrent medications.  Recommended f/u in 6 months.  States her am sugars are averaging low 100s and states pm sugars averaging 135-215.  Most under 200.  Discussed diet and exercise.  No problems with low sugars.  No chest pain.  Breathing stable. No acid reflux.  Persistent problems with diarrhea.  Seeing GI.  Diagnosed with IBS with diarrhea.  Has tried several medications.  Not making a big difference in her symptoms.  Plans to f/u with GI.  Planning to have cataract surgery beginning of next week.     Past Medical History:  Diagnosis Date  . Anemia   . Arthritis   . B12 deficiency   . Carpal tunnel syndrome, bilateral   . Diabetes mellitus ([place]) 02/17/2012  . Diabetes mellitus, type II ([place])   . Dyspnea   . Gastritis   . GERD (gastroesophageal reflux disease)   . Hypercholesterolemia   . Hypertension   . IBS (irritable bowel syndrome)   . Neuropathy    legs  . Pernicious anemia   . Vertigo    no episodes fo 9 or 10 yrs   Past Surgical History:  Procedure Laterality Date  . BREAST BIOPSY Right 1980   neg  . BREAST SURGERY  1980   benign cyst removal  . CHOLECYSTECTOMY  1983   Family History  Problem Relation Age of Onset  . Diabetes Father   . Kidney disease Father   . Heart disease Father   . Alcohol abuse Mother   . Dementia Mother   . Diabetes Mother   . Diabetes Other   . Heart disease Son 49       CABG  . Hemophilia Brother   . Uterine cancer Sister   . Diabetes Sister   . COPD Sister   . Breast cancer Maternal Aunt 51  . Breast cancer Daughter 74     Social History   Socioeconomic History  . Marital status: Married    Spouse name: Not on file  . Number of children: 4  . Years of education: Not on file  . Highest education level: Not on file  Occupational History  . Not on file  Social Needs  . Financial resource strain: Not on file  . Food insecurity:    Worry: Not on file    Inability: Not on file  . Transportation needs:    Medical: Not on file    Non-medical: Not on file  Tobacco Use  . Smoking status: Never Smoker  . Smokeless tobacco: Never Used  Substance and Sexual Activity  . Alcohol use: No    Alcohol/week: 0.0 oz  . Drug use: No  . Sexual activity: Not Currently  Lifestyle  . Physical activity:    Days per week: Not on file    Minutes per session: Not on file  . Stress: Not on file  Relationships  . Social connections:    Talks on phone: Not on file    Gets together: Not on file    Attends religious  service: Not on file    Active member of club or organization: Not on file    Attends meetings of clubs or organizations: Not on file    Relationship status: Not on file  Other Topics Concern  . Not on file  Social History Narrative   Lives with her husband. Has four children. Manager of an apartment building. No tobacco, alcohol or other drug use    Outpatient Encounter Medications as of 10/10/2017  Medication Sig  . cholestyramine light (PREVALITE) 4 g packet Take by mouth.  Marland Kitchen amLODipine (NORVASC) 10 MG tablet TAKE 1 TABLET BY MOUTH ONCE DAILY  . aspirin 81 MG tablet Take 81 mg by mouth daily.  Marland Kitchen atenolol (TENORMIN) 100 MG tablet TAKE ONE TABLET BY MOUTH IN THE MORNING AND  TAKE  1/2  TABLET  IN  THE  EVENING  . Blood Glucose Monitoring Suppl ([place] FLEX SYSTEM) w/Device KIT   . colestipol (COLESTID) 1 g tablet Take by mouth.  . ferrous fumarate (HEMOCYTE - 106 MG FE) 325 (106 FE) MG TABS tablet Take 1 tablet by mouth.  . fluticasone (CUTIVATE) 0.05 % cream USE AS DIRECTED  . glipiZIDE  (GLIPIZIDE XL) 5 MG 24 hr tablet TAKE 1 TABLET BY MOUTH ONCE DAILY WITH  BREAKFAST  . hydrochlorothiazide (HYDRODIURIL) 25 MG tablet TAKE ONE TABLET BY MOUTH ONCE DAILY  . Insulin Detemir (LEVEMIR) 100 UNIT/ML Pen Inject 24 units in the morning & 12 units at bedtime  . Insulin Pen Needle 32G X 8 MM MISC Use as directed three times daily Dx E11.9  . lisinopril (PRINIVIL,ZESTRIL) 20 MG tablet TAKE 1 TABLET BY MOUTH IN THE MORNING AND 2 AT BEDTIME  . magnesium oxide (MAG-OX) 400 (241.3 Mg) MG tablet TAKE ONE TABLET BY MOUTH ONCE DAILY  . metFORMIN (GLUCOPHAGE) 1000 MG tablet TAKE 1 TABLET BY MOUTH TWICE DAILY WITH  MEALS  . ONETOUCH DELICA LANCETS 38V MISC   . ONETOUCH VERIO test strip   . pantoprazole (PROTONIX) 40 MG tablet Take 1 tablet (40 mg total) by mouth 2 (two) times daily before a meal.  . pioglitazone (ACTOS) 30 MG tablet TAKE 1 TABLET BY MOUTH ONCE DAILY  . simvastatin (ZOCOR) 10 MG tablet TAKE 1 TABLET BY MOUTH AT BEDTIME  . triamcinolone cream (KENALOG) 0.1 % APPLY  CREAM EXTERNALLY TWICE DAILY  . [DISCONTINUED] benzonatate (TESSALON) 100 MG capsule Take 1 capsule (100 mg total) by mouth 3 (three) times daily as needed for cough. (Patient not taking: Reported on 10/02/2017)  . [DISCONTINUED] polyethylene glycol-electrolytes (NULYTELY/GOLYTELY) 420 g solution Take by mouth.   No facility-administered encounter medications on file as of 10/10/2017.     Review of Systems  Constitutional: Negative for appetite change and unexpected weight change.  HENT: Negative for congestion and sinus pressure.   Respiratory: Negative for cough and chest tightness.        Breathing stable.    Cardiovascular: Negative for chest pain and palpitations.  Gastrointestinal: Positive for diarrhea. Negative for abdominal pain, nausea and vomiting.  Genitourinary: Negative for difficulty urinating and dysuria.  Musculoskeletal: Negative for joint swelling and myalgias.  Skin: Negative for color change and  rash.  Neurological: Negative for dizziness, light-headedness and headaches.  Psychiatric/Behavioral: Negative for agitation and dysphoric mood.       Objective:    Physical Exam  Constitutional: She appears well-developed and well-nourished. No distress.  HENT:  Nose: Nose normal.  Mouth/Throat: Oropharynx is clear and moist.  Neck: Neck supple.  No thyromegaly present.  Cardiovascular: Normal rate and regular rhythm.  Pulmonary/Chest: Breath sounds normal. No respiratory distress. She has no wheezes.  Abdominal: Soft. Bowel sounds are normal. There is no tenderness.  Musculoskeletal: She exhibits no tenderness.  No increased swelling.  Stable.   Lymphadenopathy:    She has no cervical adenopathy.  Skin:  Stasis changes - left lower extremity.   Psychiatric: She has a normal mood and affect. Her behavior is normal.    BP 130/70 (BP Location: Left Arm, Patient Position: Sitting, Cuff Size: Large)   Pulse (!) 58   Temp 98.2 F (36.8 C) (Oral)   Resp 18   Wt 244 lb 9.6 oz (110.9 kg)   SpO2 98%   BMI 44.74 kg/m  Wt Readings from Last 3 Encounters:  10/10/17 244 lb 9.6 oz (110.9 kg)  07/03/17 240 lb (108.9 kg)  03/04/17 234 lb (106.1 kg)     Lab Results  Component Value Date   WBC 8.2 10/10/2017   HGB 12.4 10/10/2017   HCT 37.8 10/10/2017   PLT 234.0 10/10/2017   GLUCOSE 99 10/06/2017   CHOL 102 10/06/2017   TRIG 76.0 10/06/2017   HDL 55.80 10/06/2017   LDLDIRECT 74.4 02/21/2012   LDLCALC 31 10/06/2017   ALT 18 10/06/2017   AST 21 10/06/2017   NA 142 10/06/2017   K 4.7 10/06/2017   CL 105 10/06/2017   CREATININE 0.89 10/06/2017   BUN 19 10/06/2017   CO2 30 10/06/2017   TSH 2.32 10/06/2017   HGBA1C 7.5 (H) 10/06/2017   MICROALBUR 3.8 (H) 02/26/2017    Mm Digital Screening Bilateral  Result Date: 04/03/2017 CLINICAL DATA:  Screening. EXAM: DIGITAL SCREENING BILATERAL MAMMOGRAM WITH CAD COMPARISON:  Previous exam(s). ACR Breast Density Category b: There  are scattered areas of fibroglandular density. FINDINGS: There are no findings suspicious for malignancy. Images were processed with CAD. IMPRESSION: No mammographic evidence of malignancy. A result letter of this screening mammogram will be mailed directly to the patient. RECOMMENDATION: Screening mammogram in one year. (Code:SM-B-01Y) BI-RADS CATEGORY  1: Negative. Electronically Signed   By: Margarette Canada M.D.   On: 04/03/2017 16:49       Assessment & Plan:   Problem List Items Addressed This Visit    Aortic atherosclerosis ([place])    On simvastatin.        Relevant Medications   cholestyramine light (PREVALITE) 4 g packet   B12 deficiency    Continue B12 injections.        Bilateral leg edema    Has seen [facility].  Stable.        BMI 40.0-44.9, adult (HCC)    Discussed diet and exercise.  Follow.       Diabetes mellitus ([place])    Low carb diet and exercise.  Follow met b and a1c.  No problems with low sugars.  States she has her instructions for her meds from her pre op.        Hyperlipidemia    On simvastatin.  Low cholesterol diet and exercise.  Follow lipid panel and liver function tests.        Relevant Medications   cholestyramine light (PREVALITE) 4 g packet   Hypertension    Blood pressure has been under reasonable control.  Continue current medication regimen.  Will need close intra op and post op monitoring of her heart rate and blood pressure to avoid extremes - during her procedure.        Relevant  Medications   cholestyramine light (PREVALITE) 4 g packet   IBS (irritable bowel syndrome)    Seeing GI.  Persistent diarrhea.  Has tried multiple medications.  Continue f/u with GI.        Lymphedema    Being followed by [facility].        SOB (shortness of breath) - Primary    SOB with exertion.  See last note.  States feels breathing stable.  Just saw cardiology.  ECHO as outlined.  Check cxr prior to surgery.  She will contact cardiology to make them aware of her  planned procedure to see if they have any further recs and for cardiac clearance.        Relevant Orders   DG Chest 2 View (Completed)    Other Visit Diagnoses    Anemia, unspecified type       hgb slightly decreased on recent check.  recheck cbc.     Relevant Orders   CBC with Differential/Platelet (Completed)       Einar Pheasant, MD

## 2017-10-12 ENCOUNTER — Encounter: Payer: Self-pay | Admitting: Internal Medicine

## 2017-10-12 NOTE — Assessment & Plan Note (Signed)
On simvastatin.   

## 2017-10-12 NOTE — Assessment & Plan Note (Signed)
SOB with exertion.  See last note.  States feels breathing stable.  Just saw cardiology.  ECHO as outlined.  Check cxr prior to surgery.  She will contact cardiology to make them aware of her planned procedure to see if they have any further recs and for cardiac clearance.

## 2017-10-12 NOTE — Assessment & Plan Note (Signed)
Has seen [facility].  Stable.  

## 2017-10-12 NOTE — Assessment & Plan Note (Signed)
On simvastatin.  Low cholesterol diet and exercise.  Follow lipid panel and liver function tests.   

## 2017-10-12 NOTE — Assessment & Plan Note (Signed)
Continue B12 injections.   

## 2017-10-12 NOTE — Assessment & Plan Note (Signed)
Seeing GI.  Persistent diarrhea.  Has tried multiple medications.  Continue f/u with GI.

## 2017-10-12 NOTE — Assessment & Plan Note (Signed)
Discussed diet and exercise.  Follow.  

## 2017-10-12 NOTE — Assessment & Plan Note (Addendum)
Being followed by [facility].   

## 2017-10-12 NOTE — Assessment & Plan Note (Signed)
Low carb diet and exercise.  Follow met b and a1c.  No problems with low sugars.  States she has her instructions for her meds from her pre op.

## 2017-10-12 NOTE — Assessment & Plan Note (Signed)
Blood pressure has been under reasonable control.  Continue current medication regimen.  Will need close intra op and post op monitoring of her heart rate and blood pressure to avoid extremes - during her procedure.

## 2017-10-13 ENCOUNTER — Encounter
Admission: RE | Disposition: A | Discharge status: Home / Self Care | Payer: Self-pay | Source: Ambulatory Visit | Attending: Ophthalmology

## 2017-10-13 ENCOUNTER — Ambulatory Visit: Payer: Medicare HMO | Admitting: Anesthesiology

## 2017-10-13 ENCOUNTER — Other Ambulatory Visit: Payer: Self-pay | Admitting: Internal Medicine

## 2017-10-13 ENCOUNTER — Encounter: Payer: Self-pay | Admitting: Internal Medicine

## 2017-10-13 ENCOUNTER — Ambulatory Visit
Admission: RE | Admit: 2017-10-13 | Discharge: 2017-10-13 | Disposition: A | Discharge status: Home / Self Care | Payer: Medicare HMO | Source: Ambulatory Visit | Attending: Ophthalmology | Admitting: Ophthalmology

## 2017-10-13 DIAGNOSIS — I272 Pulmonary hypertension, unspecified: Secondary | ICD-10-CM | POA: Diagnosis not present

## 2017-10-13 DIAGNOSIS — R0602 Shortness of breath: Secondary | ICD-10-CM | POA: Diagnosis not present

## 2017-10-13 DIAGNOSIS — I10 Essential (primary) hypertension: Secondary | ICD-10-CM | POA: Insufficient documentation

## 2017-10-13 DIAGNOSIS — Z6841 Body Mass Index (BMI) 40.0 and over, adult: Secondary | ICD-10-CM | POA: Diagnosis not present

## 2017-10-13 DIAGNOSIS — E538 Deficiency of other specified B group vitamins: Secondary | ICD-10-CM | POA: Insufficient documentation

## 2017-10-13 DIAGNOSIS — E1151 Type 2 diabetes mellitus with diabetic peripheral angiopathy without gangrene: Secondary | ICD-10-CM | POA: Diagnosis not present

## 2017-10-13 DIAGNOSIS — I071 Rheumatic tricuspid insufficiency: Secondary | ICD-10-CM | POA: Insufficient documentation

## 2017-10-13 DIAGNOSIS — Z880 Allergy status to penicillin: Secondary | ICD-10-CM | POA: Insufficient documentation

## 2017-10-13 DIAGNOSIS — Z881 Allergy status to other antibiotic agents status: Secondary | ICD-10-CM | POA: Diagnosis not present

## 2017-10-13 DIAGNOSIS — M1990 Unspecified osteoarthritis, unspecified site: Secondary | ICD-10-CM | POA: Insufficient documentation

## 2017-10-13 DIAGNOSIS — H2512 Age-related nuclear cataract, left eye: Secondary | ICD-10-CM | POA: Insufficient documentation

## 2017-10-13 DIAGNOSIS — Z885 Allergy status to narcotic agent status: Secondary | ICD-10-CM | POA: Diagnosis not present

## 2017-10-13 DIAGNOSIS — E7800 Pure hypercholesterolemia, unspecified: Secondary | ICD-10-CM | POA: Insufficient documentation

## 2017-10-13 DIAGNOSIS — E1140 Type 2 diabetes mellitus with diabetic neuropathy, unspecified: Secondary | ICD-10-CM | POA: Diagnosis not present

## 2017-10-13 DIAGNOSIS — H25812 Combined forms of age-related cataract, left eye: Secondary | ICD-10-CM | POA: Diagnosis not present

## 2017-10-13 HISTORY — DX: Polyneuropathy, unspecified: G62.9

## 2017-10-13 HISTORY — DX: Carpal tunnel syndrome, bilateral upper limbs: G56.03

## 2017-10-13 HISTORY — PX: CATARACT EXTRACTION W/PHACO: SHX586

## 2017-10-13 HISTORY — DX: Dyspnea, unspecified: R06.00

## 2017-10-13 HISTORY — DX: Dizziness and giddiness: R42

## 2017-10-13 LAB — GLUCOSE, CAPILLARY
Glucose-Capillary: 111 mg/dL — ABNORMAL HIGH (ref 70–99)
Glucose-Capillary: 133 mg/dL — ABNORMAL HIGH (ref 70–99)

## 2017-10-13 SURGERY — PHACOEMULSIFICATION, CATARACT, WITH IOL INSERTION
Anesthesia: Monitor Anesthesia Care | Site: Eye | Laterality: Left | Wound class: Clean

## 2017-10-13 MED ORDER — SODIUM HYALURONATE 10 MG/ML IO SOLN
INTRAOCULAR | Status: DC | PRN
  Administered 2017-10-13: 0.55 mL via INTRAOCULAR

## 2017-10-13 MED ORDER — LACTATED RINGERS IV SOLN: 10.0000 mL/h | INTRAVENOUS | Status: DC

## 2017-10-13 MED ORDER — ONDANSETRON HCL 4 MG/2ML IJ SOLN: 4.0000 mg | Freq: Once | INTRAMUSCULAR | Status: DC | PRN

## 2017-10-13 MED ORDER — SODIUM HYALURONATE 23 MG/ML IO SOLN
INTRAOCULAR | Status: DC | PRN
  Administered 2017-10-13: 0.6 mL via INTRAOCULAR

## 2017-10-13 MED ORDER — MOXIFLOXACIN HCL 0.5 % OP SOLN
OPHTHALMIC | Status: DC | PRN
  Administered 2017-10-13: 0.2 mL via OPHTHALMIC

## 2017-10-13 MED ORDER — FENTANYL CITRATE (PF) 100 MCG/2ML IJ SOLN
INTRAMUSCULAR | Status: DC | PRN
  Administered 2017-10-13: 50 ug via INTRAVENOUS

## 2017-10-13 MED ORDER — ARMC OPHTHALMIC DILATING DROPS
1.0000 "application " | OPHTHALMIC | Status: DC | PRN
  Administered 2017-10-13 (×3): 1 via OPHTHALMIC

## 2017-10-13 MED ORDER — MIDAZOLAM HCL 2 MG/2ML IJ SOLN
INTRAMUSCULAR | Status: DC | PRN
  Administered 2017-10-13 (×2): 1 mg via INTRAVENOUS

## 2017-10-13 MED ORDER — EPINEPHRINE PF 1 MG/ML IJ SOLN
INTRAOCULAR | Status: DC | PRN
  Administered 2017-10-13: 100 mL via OPHTHALMIC

## 2017-10-13 MED ORDER — LIDOCAINE HCL (PF) 2 % IJ SOLN
INTRAOCULAR | Status: DC | PRN
  Administered 2017-10-13: 1 mL via INTRAOCULAR

## 2017-10-13 SURGICAL SUPPLY — 16 items

## 2017-10-13 NOTE — Anesthesia Procedure Notes (Signed)
Procedure Name: MAC Date/Time: 10/13/2017 9:41 AM Performed by: Lind Guest, CRNA Pre-anesthesia Checklist: Patient identified, Emergency Drugs available, Suction available, Patient being monitored and Timeout performed Patient Re-evaluated:Patient Re-evaluated prior to induction Oxygen Delivery Method: Nasal cannula

## 2017-10-13 NOTE — Progress Notes (Signed)
Order placed for pulmonary referral.  

## 2017-10-13 NOTE — Op Note (Signed)
OPERATIVE NOTE  Beth BostonMickie M Julson [id number] 10/13/2017   PREOPERATIVE DIAGNOSIS:  Nuclear sclerotic cataract left eye.  H25.12   POSTOPERATIVE DIAGNOSIS:    Nuclear sclerotic cataract left eye.     PROCEDURE:  Phacoemusification with posterior chamber intraocular lens placement of the left eye   LENS:   Implant Name Type Inv. Item Serial No. Manufacturer Lot No. LRB No. Used  LENS IOL DIOP 17.5 - [id number] Intraocular Lens LENS IOL DIOP 17.5 [id number] AMO  Left 1       PCB00 +17.5   ULTRASOUND TIME: 1 minutes 09 seconds.  CDE 15.54   SURGEON:  Willey BladeBradley Rosan Calbert, MD, MPH   ANESTHESIA:  Topical with tetracaine drops augmented with 1% preservative-free intracameral lidocaine.  ESTIMATED BLOOD LOSS: <1 mL   COMPLICATIONS:  None.   DESCRIPTION OF PROCEDURE:  The patient was identified in the holding room and transported to the operating room and placed in the supine position under the operating microscope.  The left eye was identified as the operative eye and it was prepped and draped in the usual sterile ophthalmic fashion.   A 1.0 millimeter clear-corneal paracentesis was made at the 5:00 position. 0.5 ml of preservative-free 1% lidocaine with epinephrine was injected into the anterior chamber.  The anterior chamber was filled with Healon 5 viscoelastic.  A 2.4 millimeter keratome was used to make a near-clear corneal incision at the 2:00 position.  A curvilinear capsulorrhexis was made with a cystotome and capsulorrhexis forceps.  Balanced salt solution was used to hydrodissect and hydrodelineate the nucleus.   Phacoemulsification was then used in stop and chop fashion to remove the lens nucleus and epinucleus.  The remaining cortex was then removed using the irrigation and aspiration handpiece.   The patient has moderate corneal endothelial cell loss, so care was taken to protect the corneal endothelium with OVD throughout the case and minimize phaco energy using multiple chop  maneuvers.  Healon was then placed into the capsular bag to distend it for lens placement.  A lens was then injected into the capsular bag.  The remaining viscoelastic was aspirated.   Wounds were hydrated with balanced salt solution.  The anterior chamber was inflated to a physiologic pressure with balanced salt solution.  Intracameral vigamox 0.1 mL undiltued was injected into the eye and a drop placed onto the ocular surface.  No wound leaks were noted.  The patient was taken to the recovery room in stable condition without complications of anesthesia or surgery  Willey BladeBradley Elijahjames Fuelling 10/13/2017, 9:57 AM

## 2017-10-13 NOTE — Transfer of Care (Signed)
Immediate Anesthesia Transfer of Care Note  Patient: Beth Tyler  Procedure(s) Performed: CATARACT EXTRACTION PHACO AND INTRAOCULAR LENS PLACEMENT (IOC)  LEFT  DIABETES (Left Eye)  Patient Location: PACU  Anesthesia Type: MAC  Level of Consciousness: awake, alert  and patient cooperative  Airway and Oxygen Therapy: Patient Spontanous Breathing and Patient connected to supplemental oxygen  Post-op Assessment: Post-op Vital signs reviewed, Patient's Cardiovascular Status Stable, Respiratory Function Stable, Patent Airway and No signs of Nausea or vomiting  Post-op Vital Signs: Reviewed and stable  Complications: No apparent anesthesia complications

## 2017-10-13 NOTE — Anesthesia Postprocedure Evaluation (Signed)
Anesthesia Post Note  Patient: Beth Tyler  Procedure(s) Performed: CATARACT EXTRACTION PHACO AND INTRAOCULAR LENS PLACEMENT (IOC)  LEFT  DIABETES (Left Eye)  Patient location during evaluation: PACU Anesthesia Type: MAC Level of consciousness: awake Pain management: pain level controlled Vital Signs Assessment: post-procedure vital signs reviewed and stable Cardiovascular status: blood pressure returned to baseline Postop Assessment: no headache Anesthetic complications: no    Lavonna Monarch

## 2017-10-14 ENCOUNTER — Encounter: Payer: Self-pay | Admitting: Ophthalmology

## 2017-10-21 ENCOUNTER — Other Ambulatory Visit: Payer: Self-pay | Admitting: Internal Medicine

## 2017-10-21 NOTE — Telephone Encounter (Signed)
Copied from CRM [id number]. Topic: Quick Communication - Rx Refill/Question >> Oct 21, 2017  2:46 PM Stephannie LiSimmons, Alizon Schmeling L, NT wrote: Medication:Insulin Pen Needle 32G X 8 MM MISC    Has the patient contacted their pharmacy? yes  (Agent: If no, request that the patient contact the pharmacy for the refill. (Agent: If yes, when and what did the pharmacy advise?)  Preferred Pharmacy (with phone number or street name):[facility] [address] - [place], [place] - [address] [place] [address] [phone] (Phone) [phone] (Fax)  The pharmacy called and said they did not receive the prescription sent in June    Agent: Please be advised that RX refills may take up to 3 business days. We ask that you follow-up with your pharmacy.

## 2017-10-22 NOTE — Telephone Encounter (Signed)
Rx refill request: Insulin Pen needle 32G x 8 MM   Outside provider   Ordered : 09/12/14   LOV: 10/10/17  PCP: Lorin PicketScott  Pharmacy: verified

## 2017-10-23 NOTE — Telephone Encounter (Signed)
Please advise 

## 2017-10-27 ENCOUNTER — Ambulatory Visit: Payer: Medicare HMO | Admitting: Internal Medicine

## 2017-10-27 ENCOUNTER — Encounter: Payer: Self-pay | Admitting: Internal Medicine

## 2017-10-27 DIAGNOSIS — J849 Interstitial pulmonary disease, unspecified: Secondary | ICD-10-CM

## 2017-10-27 MED ORDER — TIOTROPIUM BROMIDE MONOHYDRATE 18 MCG IN CAPS: 18.0000 ug | ORAL_CAPSULE | Freq: Every day | RESPIRATORY_TRACT | 12 refills | Status: DC

## 2017-10-27 NOTE — Patient Instructions (Signed)
Start spiriva once daily.  Will send for CT chest and lung function tests in 2 months.

## 2017-10-27 NOTE — Progress Notes (Addendum)
[facility]    Assessment and Plan:  Bronchiectasis with interstitial lung disease. -Early changes of above seen on chest x-ray, uncertain if this is contributing significantly to dyspnea. - We will start empirically on Spiriva inhaler. - CT high-resolution and pulmonary function testing before follow-up.  Dyspnea on exertion. - Likely multifactorial from above, bibasilar atelectasis related to obesity. -Weight loss will be beneficial.  Daytime sleepiness. - We will consider referral for sleep testing at subsequent study if symptoms persist.  Orders Placed This Encounter  Procedures  . CT CHEST HIGH RESOLUTION  . Pulmonary Function Test [facility] Only   Meds ordered this encounter  Medications  . tiotropium (SPIRIVA) 18 MCG inhalation capsule    Sig: Place 1 capsule (18 mcg total) into inhaler and inhale daily.    Dispense:  30 capsule    Refill:  12   Return in about 2 months (around 01/05/2018).    Date: 10/27/2017  MRN# [id number] Beth Tyler [birth year]  Referring Physician:   RIVEN BEEBE is a 72 y.o. old female seen in consultation for chief complaint of:    Chief Complaint  Patient presents with  . Consult    ref by Nicki Reaper for SOB:  SOB w/activity: NP cough    HPI:   She was being planned for cataract surgery and a routine CXR was abrnormal. She has dyspnea  When going up a flight of stairs, walking a [employer]. This has been going on for about a year, non-progressive. She has never smoker, her parents and husband smoked. [place] lived [place] a farm.  She had reflux but controlled with protonix.  Denies sinus drainage, she has a dog, not in bedroom.  NO previous respiratory diagnoses and never been on an inhaler.   She snores at night, she is mildly sleepy during the day, she nods off for 20 to 30 min per day, she has never been tested for OSA. She has chronic lymphedema of both LE, she follows with vascular surgery.    **Chest x-ray  10/10/2017>> images personally reviewed, bibasilar interstitial changes, though this may be obscured by obesity.  There is bilateral mid zone and upper lobe bronchiectatic changes.  Changes of chronic bronchitis.  Review of lung cuts from CT abdomen pelvis from 10/22/2016 showed normal lung bases. **CBC 10/10/2017>> absolute eosinophil count 400.   PMHX:   Past Medical History:  Diagnosis Date  . Anemia   . Arthritis   . B12 deficiency   . Carpal tunnel syndrome, bilateral   . Diabetes mellitus ([place]) 02/17/2012  . Diabetes mellitus, type II ([place])   . Dyspnea   . Gastritis   . GERD (gastroesophageal reflux disease)   . Hypercholesterolemia   . Hypertension   . IBS (irritable bowel syndrome)   . Neuropathy    legs  . Pernicious anemia   . Vertigo    no episodes fo 9 or 10 yrs   Surgical Hx:  Past Surgical History:  Procedure Laterality Date  . BREAST BIOPSY Right 1980   neg  . BREAST SURGERY  1980   benign cyst removal  . CATARACT EXTRACTION W/PHACO Left 10/13/2017   Procedure: CATARACT EXTRACTION PHACO AND INTRAOCULAR LENS PLACEMENT ([place])  LEFT  DIABETES;  Surgeon: Eulogio Bear, MD;  Location: [facility];  Service: Ophthalmology;  Laterality: Left;  Diabetic - insulin and oral meds  . CHOLECYSTECTOMY  1983   Family Hx:  Family History  Problem Relation Age of Onset  .  Diabetes Father   . Kidney disease Father   . Heart disease Father   . Alcohol abuse Mother   . Dementia Mother   . Diabetes Mother   . Diabetes Other   . Heart disease Son 48       CABG  . Hemophilia Brother   . Uterine cancer Sister   . Diabetes Sister   . COPD Sister   . Breast cancer Maternal Aunt 51  . Breast cancer Daughter 76   Social Hx:   Social History   Tobacco Use  . Smoking status: Never Smoker  . Smokeless tobacco: Never Used  Substance Use Topics  . Alcohol use: No    Alcohol/week: 0.0 oz  . Drug use: No   Medication:    Current Outpatient Medications:  .   amLODipine (NORVASC) 10 MG tablet, TAKE 1 TABLET BY MOUTH ONCE DAILY, Disp: 90 tablet, Rfl: 1 .  aspirin 81 MG tablet, Take 81 mg by mouth daily., Disp: , Rfl:  .  atenolol (TENORMIN) 100 MG tablet, TAKE ONE TABLET BY MOUTH IN THE MORNING AND  TAKE  1/2  TABLET  IN  THE  EVENING, Disp: 135 tablet, Rfl: 3 .  Blood Glucose Monitoring Suppl (ONETOUCH VERIO FLEX SYSTEM) w/Device KIT, , Disp: , Rfl: 0 .  cholestyramine light (PREVALITE) 4 g packet, Take by mouth., Disp: , Rfl:  .  colestipol (COLESTID) 1 g tablet, Take by mouth., Disp: , Rfl:  .  ferrous fumarate (HEMOCYTE - 106 MG FE) 325 (106 FE) MG TABS tablet, Take 1 tablet by mouth., Disp: , Rfl:  .  fluticasone (CUTIVATE) 0.05 % cream, USE AS DIRECTED, Disp: 30 g, Rfl: 0 .  glipiZIDE (GLIPIZIDE XL) 5 MG 24 hr tablet, TAKE 1 TABLET BY MOUTH ONCE DAILY WITH  BREAKFAST, Disp: 90 tablet, Rfl: 1 .  hydrochlorothiazide (HYDRODIURIL) 25 MG tablet, TAKE ONE TABLET BY MOUTH ONCE DAILY, Disp: 90 tablet, Rfl: 3 .  Insulin Detemir (LEVEMIR) 100 UNIT/ML Pen, Inject 24 units in the morning & 12 units at bedtime, Disp: 15 mL, Rfl: 11 .  Insulin Pen Needle 32G X 8 MM MISC, Use as directed three times daily Dx E11.9, Disp: 300 each, Rfl: 1 .  lisinopril (PRINIVIL,ZESTRIL) 20 MG tablet, TAKE 1 TABLET BY MOUTH IN THE MORNING AND 2 AT BEDTIME, Disp: 270 tablet, Rfl: 1 .  magnesium oxide (MAG-OX) 400 (241.3 Mg) MG tablet, TAKE ONE TABLET BY MOUTH ONCE DAILY, Disp: 90 tablet, Rfl: 3 .  metFORMIN (GLUCOPHAGE) 1000 MG tablet, TAKE 1 TABLET BY MOUTH TWICE DAILY WITH  MEALS, Disp: 180 tablet, Rfl: 1 .  ONETOUCH DELICA LANCETS 40G MISC, , Disp: , Rfl: 99 .  ONETOUCH VERIO test strip, , Disp: , Rfl: 99 .  pantoprazole (PROTONIX) 40 MG tablet, TAKE 1 TABLET BY MOUTH TWICE DAILY BEFORE MEAL(S), Disp: 180 tablet, Rfl: 2 .  pioglitazone (ACTOS) 30 MG tablet, TAKE 1 TABLET BY MOUTH ONCE DAILY, Disp: 90 tablet, Rfl: 1 .  simvastatin (ZOCOR) 10 MG tablet, TAKE 1 TABLET BY MOUTH AT  BEDTIME, Disp: 90 tablet, Rfl: 0 .  triamcinolone cream (KENALOG) 0.1 %, APPLY  CREAM EXTERNALLY TWICE DAILY, Disp: 45 g, Rfl: 0   Allergies:  Hydrocodone-acetaminophen; Percocet [oxycodone-acetaminophen]; Demeclocycline; Penicillins; and Tetracyclines & related  Review of Systems: Gen:  Denies  fever, sweats, chills HEENT: Denies blurred vision, double vision. bleeds, sore throat Cvc:  No dizziness, chest pain. Resp:   Denies cough or sputum production, shortness of  breath Gi: Denies swallowing difficulty, stomach pain. Gu:  Denies bladder incontinence, burning urine Ext:   No Joint pain, stiffness. Skin: No skin rash,  hives  Endoc:  No polyuria, polydipsia. Psych: No depression, insomnia. Other:  All other systems were reviewed with the patient and were negative other that what is mentioned in the HPI.   Physical Examination:   VS: BP (!) 142/80 (BP Location: Left Arm, Cuff Size: Large)   Pulse 69   Ht _0  (1.575 m)   Wt 243 lb (110.2 kg)   SpO2 96%   BMI 44.45 kg/m   General Appearance: No distress  Neuro:without focal findings,  speech normal,  HEENT: PERRLA, EOM intact.   Pulmonary: normal breath sounds, No wheezing.  CardiovascularNormal S1,S2.  No m/r/g.   Abdomen: Benign, Soft, non-tender. Renal:  No costovertebral tenderness  GU:  No performed at this time. Endoc: No evident thyromegaly, no signs of acromegaly. Skin:   warm, no rashes, no ecchymosis  Extremities: normal, no cyanosis, clubbing.  Other findings:    LABORATORY PANEL:   CBC No results for input(s): WBC, HGB, HCT, PLT in the last 168 hours. ------------------------------------------------------------------------------------------------------------------  Chemistries  No results for input(s): NA, K, CL, CO2, GLUCOSE, BUN, CREATININE, CALCIUM, MG, AST, ALT, ALKPHOS, BILITOT in the last 168 hours.  Invalid input(s):  GFRCGP ------------------------------------------------------------------------------------------------------------------  Cardiac Enzymes No results for input(s): TROPONINI in the last 168 hours. ------------------------------------------------------------  RADIOLOGY:  No results found.     Thank  you for the consultation and for allowing [facility] to assist in the care of your patient. Our recommendations are noted above.  Please contact us if we can be of further service.   Marda Stalker, M.D., F.C.C.P.  Board Certified in Internal Medicine, Pulmonary Medicine, [place], and Sleep Medicine.  [facility] Office Number: [phone]   10/27/2017

## 2017-10-28 MED ORDER — INSULIN PEN NEEDLE 32G X 8 MM MISC: 1 refills | Status: DC

## 2017-10-29 ENCOUNTER — Telehealth: Payer: Self-pay | Admitting: *Deleted

## 2017-10-29 MED ORDER — FLUTICASONE-SALMETEROL 250-50 MCG/DOSE IN AEPB: 1.0000 | INHALATION_SPRAY | Freq: Two times a day (BID) | RESPIRATORY_TRACT | 3 refills | Status: DC

## 2017-10-29 NOTE — Telephone Encounter (Signed)
PA for Spiriva Handihaler denied. PA Case: [id number], Status: Denied. Questions? Contact [phone].    Please advise of change?

## 2017-10-29 NOTE — Telephone Encounter (Signed)
Rx sent Advair 250 mcg... Ins will not cover Spiriva. Called patient to notify of change  LMTCB

## 2017-10-29 NOTE — Telephone Encounter (Signed)
Lets try advair 250 1 puff twice daily, rinse mouth after use.

## 2017-10-29 NOTE — Telephone Encounter (Signed)
Pt informed. Nothing further needed. 

## 2017-11-04 ENCOUNTER — Ambulatory Visit (INDEPENDENT_AMBULATORY_CARE_PROVIDER_SITE_OTHER): Payer: Medicare HMO | Admitting: *Deleted

## 2017-11-04 DIAGNOSIS — E538 Deficiency of other specified B group vitamins: Secondary | ICD-10-CM | POA: Diagnosis not present

## 2017-11-05 MED ORDER — CYANOCOBALAMIN 1000 MCG/ML IJ SOLN
1000.0000 ug | Freq: Once | INTRAMUSCULAR | Status: AC
  Administered 2017-11-04: 1000 ug via INTRAMUSCULAR

## 2017-11-05 NOTE — Progress Notes (Signed)
Patient presented for B 12 injection to right deltoid, patient voiced no concerns nor showed any signs of distress during injection. 

## 2017-11-15 IMAGING — CR DG LUMBAR SPINE 2-3V
3 series · 4 of 4 positions shown · non-contrast
Comparison: None in PACs

CLINICAL DATA: Right-sided low back pain extending into the right
groin and right lower extremity for the past 4 months; no known
injury ; history of diabetes.

EXAM:
LUMBAR SPINE - 2-3 VIEW

[l-spine ap]
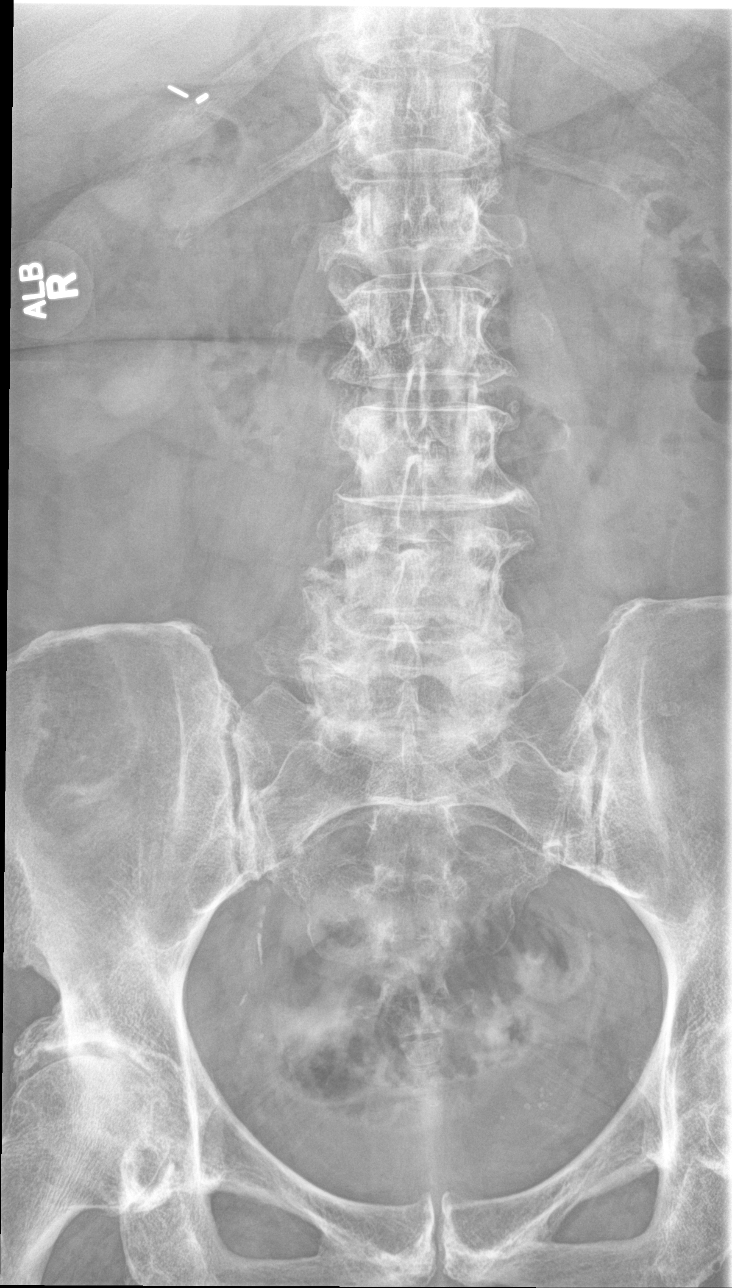

[l-spine lat]
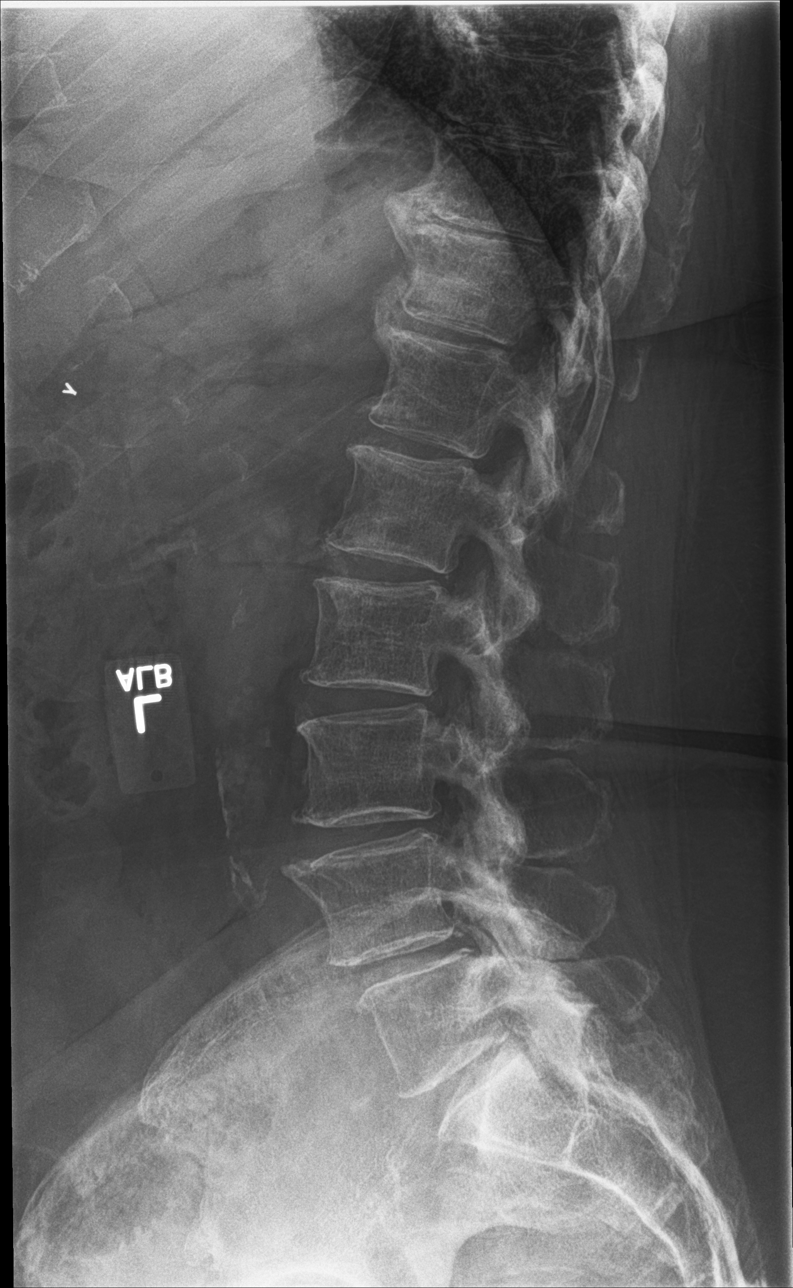

[Series 3: l-spine spot · 0.14mm/px · 2 of 2 slices shown]
[im 1/2]
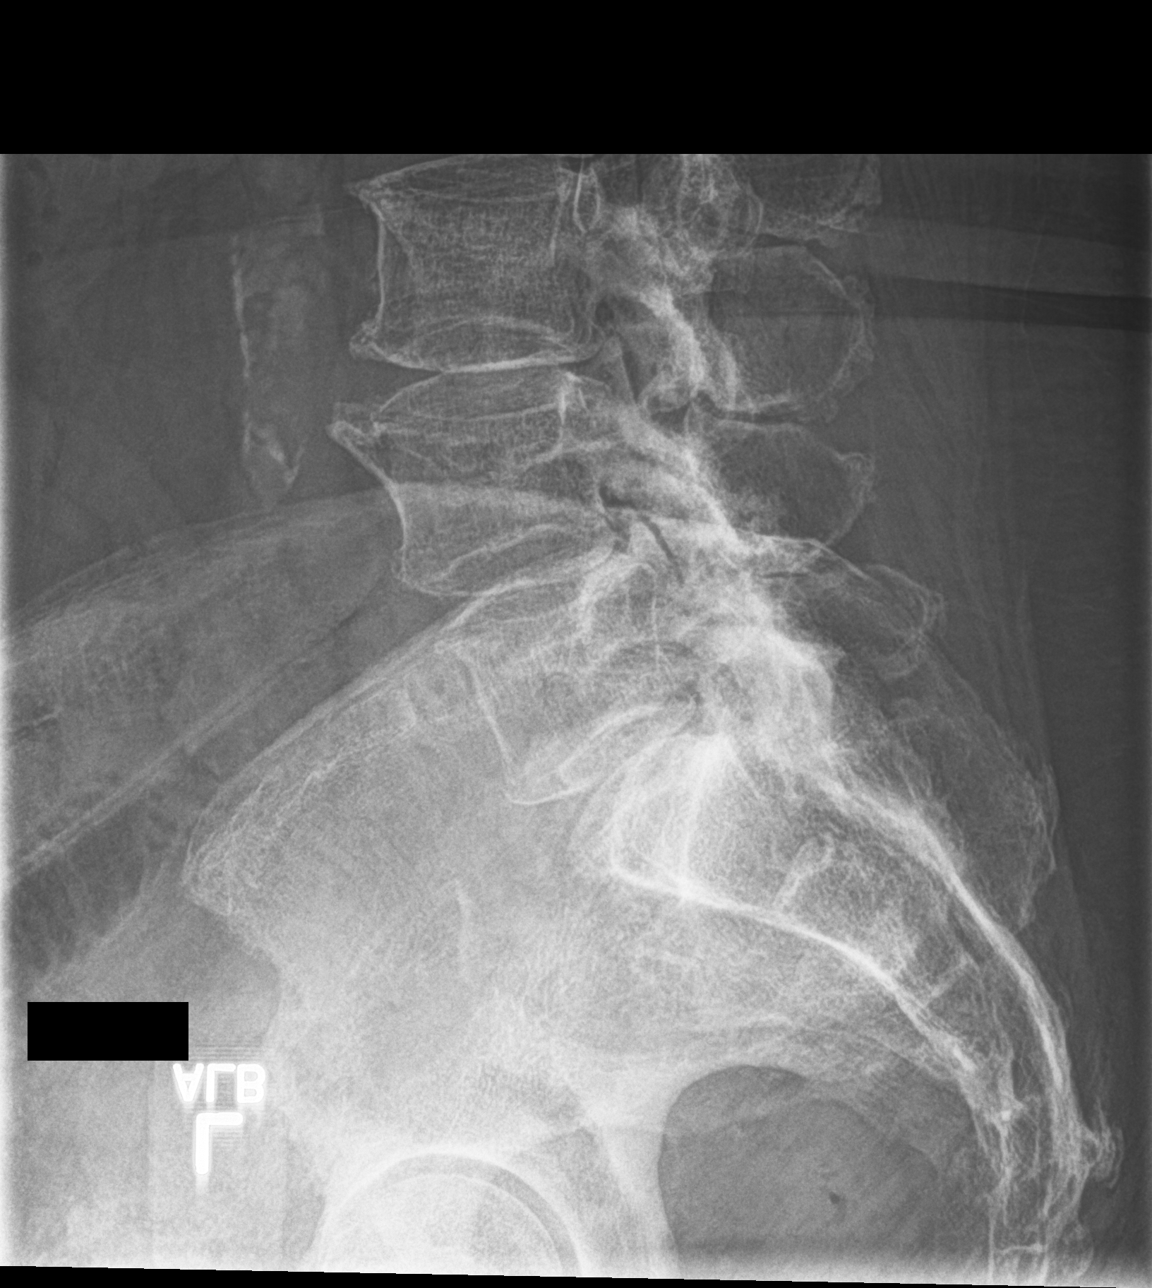
[im 2/2]
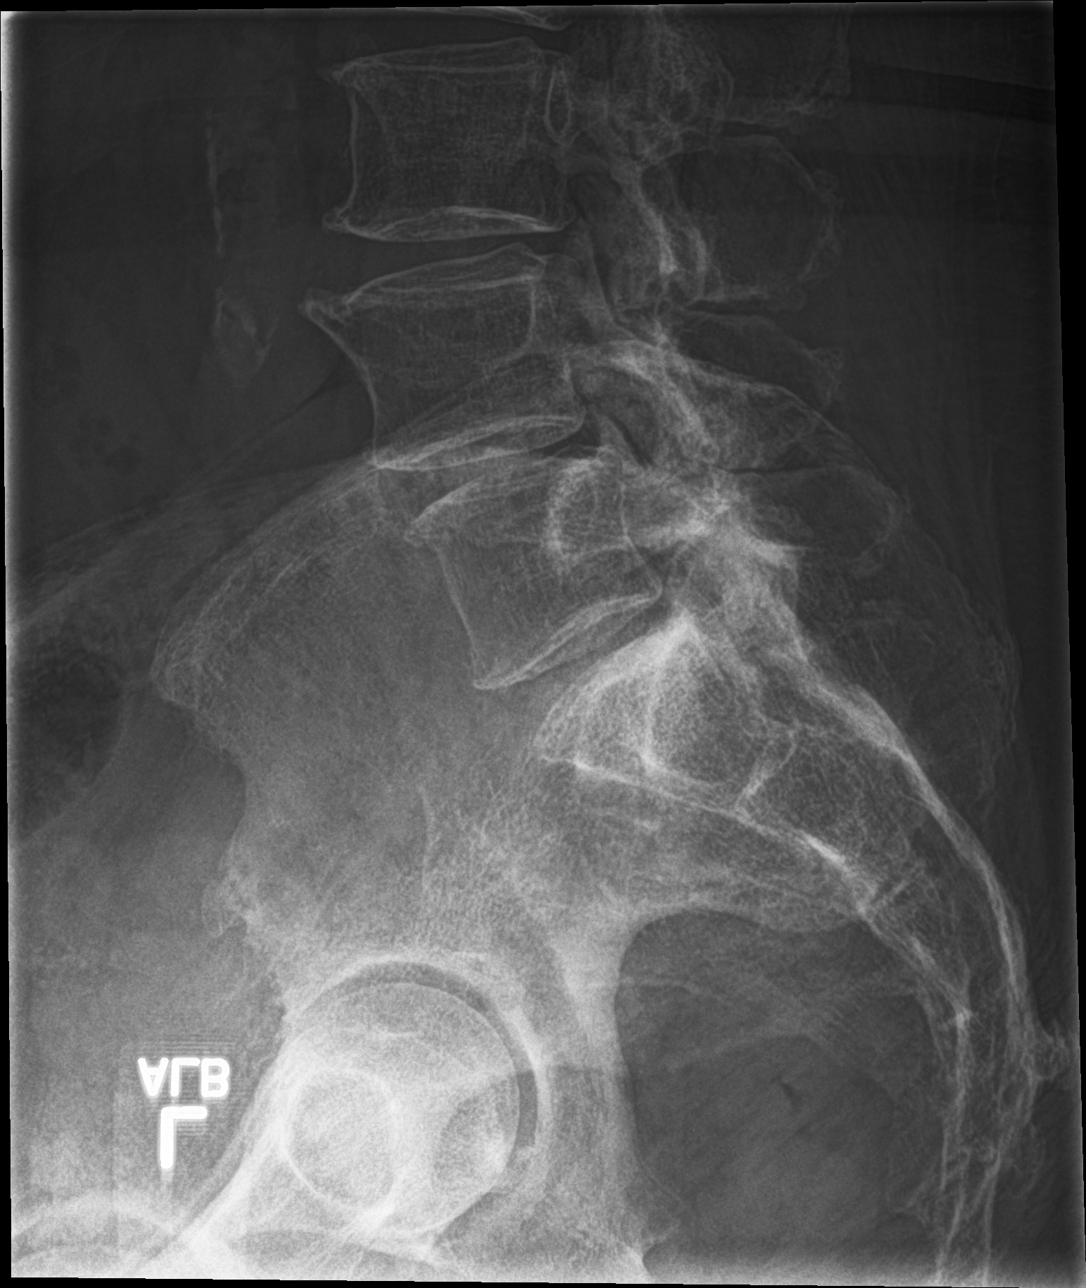

[4 of 4 positions shown; findings below may reference images not displayed]

FINDINGS: The lumbar vertebral bodies are preserved in height. The pedicles
and transverse processes are intact. There is mild disc space
narrowing at L4-5. There is no spondylolisthesis. There are anterior
endplate osteophytes from L2 through L5 with the most conspicuous
osteophytes at L3-4. There is facet joint hypertrophy at L4-5 and at
L5-S1.
IMPRESSION: There is diffuse osteopenia. There is mild disc space narrowing at
L4-5 with anterior endplate osteophytes at this and other levels.
There is facet joint hypertrophy at L4-5 and at L5-S1. There is no
acute fracture nor dislocation.

If the patient's radicular symptoms persist, MRI of the lumbar spine
would be a useful next imaging step.

## 2017-11-19 ENCOUNTER — Other Ambulatory Visit: Payer: Self-pay

## 2017-11-19 ENCOUNTER — Encounter: Payer: Self-pay | Admitting: *Deleted

## 2017-11-19 DIAGNOSIS — H2511 Age-related nuclear cataract, right eye: Secondary | ICD-10-CM | POA: Diagnosis not present

## 2017-11-27 NOTE — Discharge Instructions (Signed)

## 2017-12-01 ENCOUNTER — Ambulatory Visit: Payer: Medicare HMO | Admitting: Anesthesiology

## 2017-12-01 ENCOUNTER — Ambulatory Visit
Admission: RE | Admit: 2017-12-01 | Discharge: 2017-12-01 | Disposition: A | Discharge status: Home / Self Care | Payer: Medicare HMO | Source: Ambulatory Visit | Attending: Ophthalmology | Admitting: Ophthalmology

## 2017-12-01 ENCOUNTER — Encounter
Admission: RE | Disposition: A | Discharge status: Home / Self Care | Payer: Self-pay | Source: Ambulatory Visit | Attending: Ophthalmology

## 2017-12-01 DIAGNOSIS — Z7982 Long term (current) use of aspirin: Secondary | ICD-10-CM | POA: Diagnosis not present

## 2017-12-01 DIAGNOSIS — H25811 Combined forms of age-related cataract, right eye: Secondary | ICD-10-CM | POA: Diagnosis not present

## 2017-12-01 DIAGNOSIS — E1140 Type 2 diabetes mellitus with diabetic neuropathy, unspecified: Secondary | ICD-10-CM | POA: Insufficient documentation

## 2017-12-01 DIAGNOSIS — Z79899 Other long term (current) drug therapy: Secondary | ICD-10-CM | POA: Diagnosis not present

## 2017-12-01 DIAGNOSIS — E538 Deficiency of other specified B group vitamins: Secondary | ICD-10-CM | POA: Diagnosis not present

## 2017-12-01 DIAGNOSIS — I10 Essential (primary) hypertension: Secondary | ICD-10-CM | POA: Insufficient documentation

## 2017-12-01 DIAGNOSIS — E7800 Pure hypercholesterolemia, unspecified: Secondary | ICD-10-CM | POA: Insufficient documentation

## 2017-12-01 DIAGNOSIS — H2511 Age-related nuclear cataract, right eye: Secondary | ICD-10-CM | POA: Diagnosis not present

## 2017-12-01 DIAGNOSIS — K219 Gastro-esophageal reflux disease without esophagitis: Secondary | ICD-10-CM | POA: Insufficient documentation

## 2017-12-01 DIAGNOSIS — Z885 Allergy status to narcotic agent status: Secondary | ICD-10-CM | POA: Diagnosis not present

## 2017-12-01 DIAGNOSIS — M1990 Unspecified osteoarthritis, unspecified site: Secondary | ICD-10-CM | POA: Diagnosis not present

## 2017-12-01 DIAGNOSIS — Z794 Long term (current) use of insulin: Secondary | ICD-10-CM | POA: Insufficient documentation

## 2017-12-01 DIAGNOSIS — Z881 Allergy status to other antibiotic agents status: Secondary | ICD-10-CM | POA: Diagnosis not present

## 2017-12-01 HISTORY — PX: ANTERIOR VITRECTOMY: SHX1173

## 2017-12-01 HISTORY — PX: CATARACT EXTRACTION W/PHACO: SHX586

## 2017-12-01 LAB — GLUCOSE, CAPILLARY
GLUCOSE-CAPILLARY: 177 mg/dL — AB (ref 70–99)
Glucose-Capillary: 124 mg/dL — ABNORMAL HIGH (ref 70–99)

## 2017-12-01 SURGERY — PHACOEMULSIFICATION, CATARACT, WITH IOL INSERTION
Anesthesia: Monitor Anesthesia Care | Laterality: Right | Wound class: "Clean "

## 2017-12-01 MED ORDER — ACETAMINOPHEN 325 MG PO TABS
650.0000 mg | ORAL_TABLET | Freq: Once | ORAL | Status: AC
  Administered 2017-12-01: 325 mg via ORAL

## 2017-12-01 MED ORDER — FENTANYL CITRATE (PF) 100 MCG/2ML IJ SOLN
INTRAMUSCULAR | Status: DC | PRN
  Administered 2017-12-01 (×2): 50 ug via INTRAVENOUS

## 2017-12-01 MED ORDER — EPINEPHRINE PF 1 MG/ML IJ SOLN
INTRAOCULAR | Status: DC | PRN
  Administered 2017-12-01: 103 mL via OPHTHALMIC

## 2017-12-01 MED ORDER — NA CHONDROIT SULF-NA HYALURON 40-30 MG/ML IO SOLN
INTRAOCULAR | Status: DC | PRN
  Administered 2017-12-01: 0.5 mL via INTRAOCULAR

## 2017-12-01 MED ORDER — MIDAZOLAM HCL 2 MG/2ML IJ SOLN
INTRAMUSCULAR | Status: DC | PRN
  Administered 2017-12-01: .5 mg via INTRAVENOUS
  Administered 2017-12-01: 2 mg via INTRAVENOUS
  Administered 2017-12-01: 0.5 mg via INTRAVENOUS
  Administered 2017-12-01: 1 mg via INTRAVENOUS

## 2017-12-01 MED ORDER — SODIUM HYALURONATE 23 MG/ML IO SOLN
INTRAOCULAR | Status: DC | PRN
  Administered 2017-12-01: 0.6 mL via INTRAOCULAR

## 2017-12-01 MED ORDER — SODIUM HYALURONATE 10 MG/ML IO SOLN
INTRAOCULAR | Status: DC | PRN
  Administered 2017-12-01: 0.55 mL via INTRAOCULAR

## 2017-12-01 MED ORDER — ACETYLCHOLINE CHLORIDE 20 MG IO SOLR
INTRAOCULAR | Status: DC | PRN
  Administered 2017-12-01: 5 mg via INTRAOCULAR

## 2017-12-01 MED ORDER — LACTATED RINGERS IV SOLN: INTRAVENOUS | Status: DC

## 2017-12-01 MED ORDER — ARMC OPHTHALMIC DILATING DROPS
1.0000 "application " | OPHTHALMIC | Status: DC | PRN
  Administered 2017-12-01 (×3): 1 via OPHTHALMIC

## 2017-12-01 MED ORDER — LIDOCAINE HCL (PF) 2 % IJ SOLN
INTRAOCULAR | Status: DC | PRN
  Administered 2017-12-01: 1 mL via INTRAOCULAR

## 2017-12-01 MED ORDER — MOXIFLOXACIN HCL 0.5 % OP SOLN
OPHTHALMIC | Status: DC | PRN
  Administered 2017-12-01: 0.2 mL via OPHTHALMIC

## 2017-12-01 MED ORDER — TRIAMCINOLONE ACETONIDE 40 MG/ML IO SUSP
INTRAOCULAR | Status: DC | PRN
  Administered 2017-12-01: 40 mg via SUBCONJUNCTIVAL

## 2017-12-01 SURGICAL SUPPLY — 21 items
CANNULA ANT/CHMB 27G (MISCELLANEOUS) ×1 IMPLANT
CANNULA ANT/CHMB 27GA (MISCELLANEOUS) ×6 IMPLANT
DISSECTOR HYDRO NUCLEUS 50X22 (MISCELLANEOUS) ×2 IMPLANT
GLOVE BIO SURGEON STRL SZ8 (GLOVE) ×2 IMPLANT
GLOVE SURG LX 7.5 STRW (GLOVE) ×1
GLOVE SURG LX STRL 7.5 STRW (GLOVE) ×1 IMPLANT
GOWN STRL REUS W/ TWL LRG LVL3 (GOWN DISPOSABLE) ×2 IMPLANT
GOWN STRL REUS W/TWL LRG LVL3 (GOWN DISPOSABLE) ×2
LENS IOL ACRSF MP 17.0 (Intraocular Lens) IMPLANT
LENS IOL ACRYSOF POST 17.0 (Intraocular Lens) ×2 IMPLANT
MARKER SKIN DUAL TIP RULER LAB (MISCELLANEOUS) ×2 IMPLANT
PACK CATARACT (MISCELLANEOUS) ×2 IMPLANT
PACK DR. KING ARMS (PACKS) ×2 IMPLANT
PACK EYE AFTER SURG (MISCELLANEOUS) ×2 IMPLANT
PACK VITRECTOMY ANT (MISCELLANEOUS) ×1 IMPLANT
SUT ETHILON 10-0 CS-B-6CS-B-6 (SUTURE) ×2
SUTURE EHLN 10-0 CS-B-6CS-B-6 (SUTURE) IMPLANT
SYR 3ML LL SCALE MARK (SYRINGE) ×3 IMPLANT
SYR TB 1ML LUER SLIP (SYRINGE) ×2 IMPLANT
WATER STERILE IRR 500ML POUR (IV SOLUTION) ×2 IMPLANT
WIPE NON LINTING 3.25X3.25 (MISCELLANEOUS) ×2 IMPLANT

## 2017-12-01 NOTE — Anesthesia Postprocedure Evaluation (Signed)
Anesthesia Post Note  Patient: Beth Tyler  Procedure(s) Performed: CATARACT EXTRACTION PHACO AND INTRAOCULAR LENS PLACEMENT (IOC)  RIGHT DIABETIC (Right ) ANTERIOR VITRECTOMY (Right )  Patient location during evaluation: PACU Anesthesia Type: MAC Level of consciousness: awake and alert and oriented Pain management: satisfactory to patient Vital Signs Assessment: post-procedure vital signs reviewed and stable Respiratory status: spontaneous breathing, nonlabored ventilation and respiratory function stable Cardiovascular status: blood pressure returned to baseline and stable Postop Assessment: Adequate PO intake and No signs of nausea or vomiting Anesthetic complications: no    Raliegh Ip

## 2017-12-01 NOTE — H&P (Signed)
The History and Physical notes are on paper, have been signed, and are to be scanned.   I have examined the patient and there are no changes to the H&P.   Willey BladeBradley Khoi Hamberger 12/01/2017 8:01 AM

## 2017-12-01 NOTE — Anesthesia Preprocedure Evaluation (Signed)
Anesthesia Evaluation  Patient identified by MRN, date of birth, ID band Patient awake    Reviewed: Allergy & Precautions, H&P , NPO status , Patient's Chart, lab work & pertinent test results  Airway Mallampati: II  TM Distance: >3 FB Neck ROM: full    Dental no notable dental hx. (+) Poor Dentition   Pulmonary    Pulmonary exam normal breath sounds clear to auscultation       Cardiovascular hypertension, Normal cardiovascular exam Rhythm:regular Rate:Normal     Neuro/Psych  Neuromuscular disease    GI/Hepatic GERD  ,  Endo/Other  diabetes  Renal/GU      Musculoskeletal   Abdominal   Peds  Hematology   Anesthesia Other Findings   Reproductive/Obstetrics                             Anesthesia Physical Anesthesia Plan  ASA: II  Anesthesia Plan: MAC   Post-op Pain Management:    Induction:   PONV Risk Score and Plan: 2 and Treatment may vary due to age or medical condition and Midazolam  Airway Management Planned:   Additional Equipment:   Intra-op Plan:   Post-operative Plan:   Informed Consent: I have reviewed the patients History and Physical, chart, labs and discussed the procedure including the risks, benefits and alternatives for the proposed anesthesia with the patient or authorized representative who has indicated his/her understanding and acceptance.     Plan Discussed with: CRNA  Anesthesia Plan Comments:         Anesthesia Quick Evaluation

## 2017-12-01 NOTE — Transfer of Care (Signed)
Immediate Anesthesia Transfer of Care Note  Patient: Beth Tyler  Procedure(s) Performed: CATARACT EXTRACTION PHACO AND INTRAOCULAR LENS PLACEMENT (IOC)  RIGHT DIABETIC (Right ) ANTERIOR VITRECTOMY (Right )  Patient Location: PACU  Anesthesia Type: MAC  Level of Consciousness: awake, alert  and patient cooperative  Airway and Oxygen Therapy: Patient Spontanous Breathing and Patient connected to supplemental oxygen  Post-op Assessment: Post-op Vital signs reviewed, Patient's Cardiovascular Status Stable, Respiratory Function Stable, Patent Airway and No signs of Nausea or vomiting  Post-op Vital Signs: Reviewed and stable  Complications: No apparent anesthesia complications

## 2017-12-01 NOTE — Op Note (Signed)
OPERATIVE NOTE  Beth BostonMickie [name] [id number] 12/01/2017   PREOPERATIVE DIAGNOSIS:  Nuclear sclerotic cataract right eye.  H25.11   POSTOPERATIVE DIAGNOSIS:    Nuclear sclerotic cataract right eye.     PROCEDURE:   1.  Phacoemusification with posterior chamber intraocular lens placement of the right eye  2.  Anterior vitrectomy, right eye.  CPT [id number]  LENS:   Implant Name Type Inv. Item Serial No. Manufacturer Lot No. LRB No. Used  LENS IOL POST 17.0 - [id number] Intraocular Lens LENS IOL POST 17.0 [id number] ALCON  Right 1       MA60AC +17.0   ULTRASOUND TIME: 1 minutes 25 seconds.  CDE 14.53   SURGEON:  Willey BladeBradley Marcas Bowsher, MD, MPH  ANESTHESIOLOGIST: Anesthesiologist: Ranee GosselinStella, Michael, MD CRNA: Jimmy PicketAmyot, Michael, CRNA   ANESTHESIA:  Topical with tetracaine drops augmented with 1% preservative-free intracameral lidocaine.  ESTIMATED BLOOD LOSS: less than 1 mL.   COMPLICATIONS:  rupture of posterior capsule with vitreous loss.  No detected loss of lens fragments.   DESCRIPTION OF PROCEDURE:  The patient was identified in the holding room and transported to the operating room and placed in the supine position under the operating microscope.  The right eye was identified as the operative eye and it was prepped and draped in the usual sterile ophthalmic fashion.   A 1.0 millimeter clear-corneal paracentesis was made at the 10:30 position. 0.5 ml of preservative-free 1% lidocaine with epinephrine was injected into the anterior chamber.  The anterior chamber was filled with Healon 5 viscoelastic.  A 2.4 millimeter keratome was used to make a near-clear corneal incision at the 8:00 position.  A curvilinear capsulorrhexis was made with a cystotome and capsulorrhexis forceps.  Balanced salt solution was used to hydrodissect and hydrodelineate the nucleus.   Phacoemulsification was then used in stop and chop fashion to remove the lens nucleus and epinucleus.    During removal of the final  nucleus fragment a small posterior rent was detected centrally likely caused by post occlusive surge and contact with the phaco tip.  OVD was injected.  An anterior vitrectomy was performed and the remaining cortex was removed with the vitrectomy handpiece.  A second paracentesis was created to accommodate the irrigation handpiece.  Vitreous in the anterior chamber was identified with triescence.  Healon was then placed into the sulcus and anterior chamber for lens placement.  The wound was enlarged to allow the B cartridge and 3 piece lens. A lens was then injected into the sulcus and the trailing haptic positioned with kelman mcpherson forceps.  A suture was placed bisecting the primary wound.  The The remaining viscoelastic rinsed out with syringes of BSS.   Wounds were hydrated with balanced salt solution.  The anterior chamber was inflated to a physiologic pressure with balanced salt solution.   Intracameral vigamox 0.1 mL undiluted was injected into the eye and a drop placed onto the ocular surface.  No wound leaks were noted.  A patch and shield were placed on the eye.  The patient was taken to the recovery room in stable condition.   Willey BladeBradley Solange Emry 12/01/2017, 9:16 AM

## 2017-12-01 NOTE — Anesthesia Procedure Notes (Signed)
Procedure Name: MAC Performed by: Josedejesus Marcum, CRNA Pre-anesthesia Checklist: Patient identified, Emergency Drugs available, Suction available, Timeout performed and Patient being monitored Patient Re-evaluated:Patient Re-evaluated prior to induction Oxygen Delivery Method: Nasal cannula Placement Confirmation: positive ETCO2       

## 2017-12-02 ENCOUNTER — Encounter: Payer: Self-pay | Admitting: Ophthalmology

## 2017-12-03 ENCOUNTER — Ambulatory Visit
Admission: EM | Admit: 2017-12-03 | Discharge: 2017-12-03 | Disposition: A | Discharge status: Home / Self Care | Payer: Medicare HMO | Attending: Family Medicine | Admitting: Family Medicine

## 2017-12-03 ENCOUNTER — Ambulatory Visit (INDEPENDENT_AMBULATORY_CARE_PROVIDER_SITE_OTHER): Payer: Medicare HMO

## 2017-12-03 ENCOUNTER — Encounter: Payer: Self-pay | Admitting: Gynecology

## 2017-12-03 DIAGNOSIS — R509 Fever, unspecified: Secondary | ICD-10-CM

## 2017-12-03 DIAGNOSIS — L03115 Cellulitis of right lower limb: Secondary | ICD-10-CM

## 2017-12-03 DIAGNOSIS — M791 Myalgia, unspecified site: Secondary | ICD-10-CM

## 2017-12-03 DIAGNOSIS — J984 Other disorders of lung: Secondary | ICD-10-CM | POA: Diagnosis not present

## 2017-12-03 LAB — BASIC METABOLIC PANEL
ANION GAP: 11 (ref 5–15)
BUN: 21 mg/dL (ref 8–23)
CALCIUM: 9.1 mg/dL (ref 8.9–10.3)
CO2: 24 mmol/L (ref 22–32)
Chloride: 101 mmol/L (ref 98–111)
Creatinine, Ser: 0.87 mg/dL (ref 0.44–1.00)
GFR calc non Af Amer: >60 mL/min (ref >60)
Glucose, Bld: 177 mg/dL — ABNORMAL HIGH (ref 70–99)
Potassium: 4.4 mmol/L (ref 3.5–5.1)
Sodium: 136 mmol/L (ref 135–145)

## 2017-12-03 LAB — URINALYSIS, COMPLETE (UACMP) WITH MICROSCOPIC
BACTERIA UA: NONE SEEN
Bilirubin Urine: NEGATIVE
Glucose, UA: NEGATIVE mg/dL
KETONES UR: NEGATIVE mg/dL
LEUKOCYTES UA: NEGATIVE
Nitrite: NEGATIVE
Protein, ur: 100 mg/dL — AB
SQUAMOUS EPITHELIAL / LPF: NONE SEEN (ref 0–5)
Specific Gravity, Urine: 1.015 (ref 1.005–1.030)
WBC, UA: NONE SEEN WBC/hpf (ref 0–5)
pH: 6.5 (ref 5.0–8.0)

## 2017-12-03 LAB — CBC WITH DIFFERENTIAL/PLATELET
BASOS PCT: 0 %
Basophils Absolute: 0 10*3/uL (ref 0–0.1)
Eosinophils Absolute: 0.1 10*3/uL (ref 0–0.7)
Eosinophils Relative: 1 %
HEMATOCRIT: 38.8 % (ref 35.0–47.0)
Hemoglobin: 12.7 g/dL (ref 12.0–16.0)
Lymphocytes Relative: 9 %
Lymphs Abs: 0.8 10*3/uL — ABNORMAL LOW (ref 1.0–3.6)
MCH: 29.2 pg (ref 26.0–34.0)
MCHC: 32.8 g/dL (ref 32.0–36.0)
MCV: 89.2 fL (ref 80.0–100.0)
Monocytes Absolute: 0.5 10*3/uL (ref 0.2–0.9)
Monocytes Relative: 6 %
NEUTROS ABS: 7.1 10*3/uL — AB (ref 1.4–6.5)
Neutrophils Relative %: 84 %
Platelets: 214 10*3/uL (ref 150–440)
RBC: 4.35 MIL/uL (ref 3.80–5.20)
RDW: 13.5 % (ref 11.5–14.5)
WBC: 8.5 10*3/uL (ref 3.6–11.0)

## 2017-12-03 MED ORDER — CEPHALEXIN 500 MG PO CAPS: 500.0000 mg | ORAL_CAPSULE | Freq: Two times a day (BID) | ORAL | 0 refills | Status: AC

## 2017-12-03 MED ORDER — ACETAMINOPHEN 325 MG PO TABS
325.0000 mg | ORAL_TABLET | Freq: Four times a day (QID) | ORAL | Status: DC | PRN
  Administered 2017-12-03: 325 mg via ORAL

## 2017-12-03 NOTE — Discharge Instructions (Addendum)
Take medication as prescribed.  Monitor yourself closely.  Continue Tylenol at home to help with fever.  Also as mentioned, recommended by ophthalmology to have follow-up tomorrow.  Follow-up with your primary care doctor this week as scheduled.  Return to urgent care as needed.  Proceed directly to the emergency room for fever not controlled by medications, worsening concerns.

## 2017-12-03 NOTE — ED Triage Notes (Signed)
Per patient c/o fever and urinary tract infection. Patient also stated had eye surgery x 2 days ago. Per patient had follow up visit x yesterday.

## 2017-12-03 NOTE — ED Provider Notes (Signed)
[facility] ____________________________________________  Time seen: Approximately 7:11 PM  I have reviewed the triage vital signs and the nursing notes.   HISTORY  Chief Complaint Urinary Tract Infection and Fever   HPI Beth Tyler is a 72 y.o. female presenting with family at bedside for evaluation of fever that started yesterday.  States last night fever was subjective, but reports today did measure and it was 100.1.  Did last take Tylenol last night, none taken today.  States that she is having accompanying chills and body aches with this.  States last time she had a fever and similar complaints she had a urinary tract infection.  Does have urinary frequency, but states this is normal for her.  Denies any other urine changes or burning with urination.  Cataract extraction to right eye 2 days ago by [place] [facility] [facility].  States that the surgery did have one complication of a vitreous leak in which they had to sew to repair.  States that she had a follow-up appointment yesterday and everything was normal and going well.  States overall her vision is improving to her eye and she is not having any eye pain at all.  No facial swelling, facial redness or facial pain.  Patient also reports chronic bilateral lymphedema and while evaluating patient erythema noted to lower extremities, patient reports this is chronic.  Denies any cough, congestion, sore throat, abdominal pain.  Denies any bowel changes, vomiting.  Continues to overall eat and drink well.  Denies any other changes.  Denies recent sickness.  Denies any recent insect bite or tick attachment.  Denies recent sickness. Denies recent antibiotic use.  No history of MRSA or recurrent skin infections.  Einar Pheasant, MD: PCP Opth: Edison Pace   Past Medical History:  Diagnosis Date  . Anemia   . Arthritis   . B12 deficiency   . Carpal tunnel syndrome, bilateral   . Diabetes mellitus ([place]) 02/17/2012  . Diabetes mellitus, type II  ([place])   . Dyspnea   . Gastritis   . GERD (gastroesophageal reflux disease)   . Hypercholesterolemia   . Hypertension   . IBS (irritable bowel syndrome)   . Neuropathy    legs  . Pernicious anemia   . Vertigo    no episodes fo 9 or 10 yrs    Patient Active Problem List   Diagnosis Date Noted  . Arm numbness 07/06/2017  . Otitis externa 07/06/2017  . Aortic atherosclerosis ([place]) 10/22/2016  . Nausea 10/09/2016  . Lymphedema 03/11/2016  . SOB (shortness of breath) 02/25/2016  . Pain in limb 02/02/2016  . Back pain 05/23/2015  . Cough 02/13/2015  . BMI 40.0-44.9, adult ([place]) 08/10/2014  . Bilateral leg edema 08/10/2014  . Chest pain 07/17/2014  . Health care maintenance 07/17/2014  . Colon cancer screening 01/19/2014  . Stress 06/14/2013  . Hyperkalemia 03/21/2013  . IBS (irritable bowel syndrome) 02/17/2012  . Hypertension 02/17/2012  . Hyperlipidemia 02/17/2012  . Diabetes mellitus ([place]) 02/17/2012  . B12 deficiency 02/17/2012    Past Surgical History:  Procedure Laterality Date  . ANTERIOR VITRECTOMY Right 12/01/2017   Procedure: ANTERIOR VITRECTOMY;  Surgeon: Eulogio Bear, MD;  Location: [facility];  Service: Ophthalmology;  Laterality: Right;  . BREAST BIOPSY Right 1980   neg  . BREAST SURGERY  1980   benign cyst removal  . CATARACT EXTRACTION W/PHACO Left 10/13/2017   Procedure: CATARACT EXTRACTION PHACO AND INTRAOCULAR LENS PLACEMENT (IOC)  LEFT  DIABETES;  Surgeon:  Eulogio Bear, MD;  Location: [facility];  Service: Ophthalmology;  Laterality: Left;  Diabetic - insulin and oral meds  . CATARACT EXTRACTION W/PHACO Right 12/01/2017   Procedure: CATARACT EXTRACTION PHACO AND INTRAOCULAR LENS PLACEMENT ([place])  RIGHT DIABETIC;  Surgeon: Eulogio Bear, MD;  Location: [facility];  Service: Ophthalmology;  Laterality: Right;  Diabetic - insulin and oral  . CHOLECYSTECTOMY  1983      Current Facility-Administered Medications:    .  acetaminophen (TYLENOL) tablet 325 mg, 325 mg, Oral, Q6H PRN, Marylene Land, NP, 325 mg at 12/03/17 1747  Current Outpatient Medications:  .  amLODipine (NORVASC) 10 MG tablet, TAKE 1 TABLET BY MOUTH ONCE DAILY, Disp: 90 tablet, Rfl: 1 .  aspirin 81 MG tablet, Take 81 mg by mouth daily., Disp: , Rfl:  .  atenolol (TENORMIN) 100 MG tablet, TAKE ONE TABLET BY MOUTH IN THE MORNING AND  TAKE  1/2  TABLET  IN  THE  EVENING, Disp: 135 tablet, Rfl: 3 .  Blood Glucose Monitoring Suppl (ONETOUCH VERIO FLEX SYSTEM) w/Device KIT, , Disp: , Rfl: 0 .  cholestyramine light (PREVALITE) 4 g packet, Take by mouth., Disp: , Rfl:  .  colestipol (COLESTID) 1 g tablet, Take by mouth., Disp: , Rfl:  .  ferrous fumarate (HEMOCYTE - 106 MG FE) 325 (106 FE) MG TABS tablet, Take 1 tablet by mouth., Disp: , Rfl:  .  fluticasone (CUTIVATE) 0.05 % cream, USE AS DIRECTED, Disp: 30 g, Rfl: 0 .  Fluticasone-Salmeterol (ADVAIR DISKUS) 250-50 MCG/DOSE AEPB, Inhale 1 puff into the lungs 2 (two) times daily., Disp: 60 each, Rfl: 3 .  glipiZIDE (GLIPIZIDE XL) 5 MG 24 hr tablet, TAKE 1 TABLET BY MOUTH ONCE DAILY WITH  BREAKFAST, Disp: 90 tablet, Rfl: 1 .  hydrochlorothiazide (HYDRODIURIL) 25 MG tablet, TAKE ONE TABLET BY MOUTH ONCE DAILY, Disp: 90 tablet, Rfl: 3 .  Insulin Detemir (LEVEMIR) 100 UNIT/ML Pen, Inject 24 units in the morning & 12 units at bedtime, Disp: 15 mL, Rfl: 11 .  Insulin Pen Needle 32G X 8 MM MISC, Use as directed three times daily Dx E11.9, Disp: 300 each, Rfl: 1 .  lisinopril (PRINIVIL,ZESTRIL) 20 MG tablet, TAKE 1 TABLET BY MOUTH IN THE MORNING AND 2 AT BEDTIME, Disp: 270 tablet, Rfl: 1 .  magnesium oxide (MAG-OX) 400 (241.3 Mg) MG tablet, TAKE ONE TABLET BY MOUTH ONCE DAILY, Disp: 90 tablet, Rfl: 3 .  metFORMIN (GLUCOPHAGE) 1000 MG tablet, TAKE 1 TABLET BY MOUTH TWICE DAILY WITH  MEALS, Disp: 180 tablet, Rfl: 1 .  ONETOUCH DELICA LANCETS 67Y MISC, , Disp: , Rfl: 99 .  ONETOUCH VERIO test strip, , Disp:  , Rfl: 99 .  pantoprazole (PROTONIX) 40 MG tablet, TAKE 1 TABLET BY MOUTH TWICE DAILY BEFORE MEAL(S), Disp: 180 tablet, Rfl: 2 .  pioglitazone (ACTOS) 30 MG tablet, TAKE 1 TABLET BY MOUTH ONCE DAILY, Disp: 90 tablet, Rfl: 1 .  simvastatin (ZOCOR) 10 MG tablet, TAKE 1 TABLET BY MOUTH AT BEDTIME, Disp: 90 tablet, Rfl: 0 .  triamcinolone cream (KENALOG) 0.1 %, APPLY  CREAM EXTERNALLY TWICE DAILY, Disp: 45 g, Rfl: 0 .  cephALEXin (KEFLEX) 500 MG capsule, Take 1 capsule (500 mg total) by mouth 2 (two) times daily for 10 days., Disp: 20 capsule, Rfl: 0 .  tiotropium (SPIRIVA) 18 MCG inhalation capsule, Place 1 capsule (18 mcg total) into inhaler and inhale daily. (Patient not taking: Reported on 11/19/2017), Disp: 30 capsule, Rfl: 12  Allergies Hydrocodone-acetaminophen; Percocet [oxycodone-acetaminophen]; Demeclocycline; Penicillins; and Tetracyclines & related  Family History  Problem Relation Age of Onset  . Diabetes Father   . Kidney disease Father   . Heart disease Father   . Alcohol abuse Mother   . Dementia Mother   . Diabetes Mother   . Diabetes Other   . Heart disease Son 73       CABG  . Hemophilia Brother   . Uterine cancer Sister   . Diabetes Sister   . COPD Sister   . Breast cancer Maternal Aunt 51  . Breast cancer Daughter 22    Social History Social History   Tobacco Use  . Smoking status: Never Smoker  . Smokeless tobacco: Never Used  Substance Use Topics  . Alcohol use: No    Alcohol/week: 0.0 standard drinks  . Drug use: No    Review of Systems Constitutional: positive fever. Eyes: as above.  ENT: No sore throat. Cardiovascular: Denies chest pain. Respiratory: Denies shortness of breath. Gastrointestinal: No abdominal pain.  No nausea, no vomiting.  No diarrhea.  No constipation. Genitourinary:  As above.  Musculoskeletal: Negative for back pain. Skin: as above.  Neurological: Negative for headaches, focal weakness or  numbness.    ____________________________________________   PHYSICAL EXAM:  VITAL SIGNS: ED Triage Vitals  Enc Vitals Group     BP 12/03/17 1728 (!) 159/59     Pulse Rate 12/03/17 1728 70     Resp 12/03/17 1728 18     Temp 12/03/17 1728 (!) 100.6 F (38.1 C)     Temp Source 12/03/17 1728 Oral     SpO2 12/03/17 1728 100 %     Weight 12/03/17 1726 248 lb (112.5 kg)     Height 12/03/17 1726 _0  (1.575 m)     Head Circumference --      Peak Flow --      Pain Score 12/03/17 1726 0     Pain Loc --      Pain Edu? --      Excl. in [place]? --    Vitals:   12/03/17 1728 12/03/17 1844 12/03/17 1845 12/03/17 1913  BP: (!) 159/59   (!) 149/65  Pulse: 70 64 66 63  Resp: 18   16  Temp: (!) 100.6 F (38.1 C)   100.2 F (37.9 C)  TempSrc: Oral   Oral  SpO2: 100%   99%  Weight:      Height:         Constitutional: Alert and oriented. Well appearing and in no acute distress. Eyes: Conjunctivae are normal. PERRL. EOMI. right lateral iris slight irregularity noted.  Bilateral eyes nontender.  No surrounding tenderness, swelling or erythema.  No drainage from eyes. No hyphema or cloudy appearance. ENT      Head: Normocephalic and atraumatic.      Ears: Nontender, mild cerumen bilaterally, no erythema, normal TM.      Nose: No congestion/rhinnorhea.      Mouth/Throat: Mucous membranes are moist.Oropharynx non-erythematous.  No tonsillar swelling or exudate. Neck: No stridor. Supple without meningismus.  Hematological/Lymphatic/Immunilogical: No cervical lymphadenopathy. Cardiovascular: Normal rate, regular rhythm. Grossly normal heart sounds.  Good peripheral circulation. Respiratory: Normal respiratory effort without tachypnea nor retractions. Breath sounds are clear and equal bilaterally. No wheezes, rales, rhonchi. Gastrointestinal: Soft and nontender. No CVA tenderness. Musculoskeletal:  Steady gait. Neurologic:  Normal speech and language. No gross focal neurologic deficits are  appreciated. Speech is normal. No gait instability.  Skin:  Skin is warm, dry.  No other rash noted except for below. Except:       Bilateral lower extremities depicted as above with noted erythema to right lower leg worse than left, per patient edema unchanged from chronic, bilateral lower extremities nontender and pulses verified by handheld Doppler by nursing staff.  No purulence or drainage.  Psychiatric: Mood and affect are normal. Speech and behavior are normal. Patient exhibits appropriate insight and judgment   Bilateral dorsalis pedis pulses verified by Doppler by nursing staff, bilateral pulses equal. ___________________________________________   LABS (all labs ordered are listed, but only abnormal results are displayed)  Labs Reviewed  URINALYSIS, COMPLETE (UACMP) WITH MICROSCOPIC - Abnormal; Notable for the following components:      Result Value   Color, Urine STRAW (*)    Hgb urine dipstick TRACE (*)    Protein, ur 100 (*)    All other components within normal limits  CBC WITH DIFFERENTIAL/PLATELET - Abnormal; Notable for the following components:   Neutro Abs 7.1 (*)    Lymphs Abs 0.8 (*)    All other components within normal limits  BASIC METABOLIC PANEL - Abnormal; Notable for the following components:   Glucose, Bld 177 (*)    All other components within normal limits  URINE CULTURE    RADIOLOGY  Dg Chest 2 View  Result Date: 12/03/2017 CLINICAL DATA:  Fever, urinary tract infection. Eye surgery 2 days ago. History of interstitial lung disease. EXAM: CHEST - 2 VIEW COMPARISON:  Chest radiograph October 10, 2017 FINDINGS: Cardiac silhouette is mildly enlarged unchanged. Mildly calcified aortic arch. Chronic interstitial changes without pleural effusion. Similar LEFT lung base scarring. No pneumothorax. Soft tissue planes and included osseous structures are non suspicious. Stable degenerative changes thoracic spine. Surgical clips in the included right abdomen  compatible with cholecystectomy. IMPRESSION: 1. Stable cardiomegaly. 2. Stable chronic interstitial changes. Electronically Signed   By: Elon Alas M.D.   On: 12/03/2017 18:40   ____________________________________________   PROCEDURES Procedures    INITIAL IMPRESSION / ASSESSMENT AND PLAN / ED COURSE  Pertinent labs & imaging results that were available during my care of the patient were reviewed by me and considered in my medical decision making (see chart for details).  Well-appearing patient.  Family at bedside.  Presenting for evaluation of fever.  Patient 2 days post cataract extraction with above-mentioned complication but did have follow-up that was unremarkable with ophthalmology yesterday.  Called and discussed patient with ophthalmology on-call Dr. George Ina, and per Dr. George Ina do not expect systemic symptoms related to recent surgery but does recommend follow-up with Dr. Edison Pace tomorrow, patient verbalized understanding of this.  Patient exam overall unremarkable except for lower extremity skin changes as above.  Patient chest x-ray per radiologist, stable without acute changes, as well as laboratory findings reviewed.  Patient reports penicillin allergy is nausea alone.  Will treat concern of cellulitis with cephalexin.  Discussed very strict follow-up and monitoring.  Follow-up with ophthalmology tomorrow as discussed.  Tylenol ibuprofen as needed for fever.  Follow-up with primary care in 2 days.Discussed indication, risks and benefits of medications with patient.  Discussed follow up with Primary care physician this week. Discussed follow up and return parameters including no resolution or any worsening concerns. Patient verbalized understanding and agreed to plan.   ____________________________________________   FINAL CLINICAL IMPRESSION(S) / ED DIAGNOSES  Final diagnoses:  Fever, unspecified  Cellulitis of leg, right     ED Discharge Orders  Ordered     cephALEXin (KEFLEX) 500 MG capsule  2 times daily     12/03/17 1913           Note: This dictation was prepared with Dragon dictation along with smaller phrase technology. Any transcriptional errors that result from this process are unintentional.         Marylene Land, NP 12/03/17 2130

## 2017-12-05 LAB — URINE CULTURE

## 2017-12-09 ENCOUNTER — Ambulatory Visit (INDEPENDENT_AMBULATORY_CARE_PROVIDER_SITE_OTHER): Payer: Medicare HMO

## 2017-12-09 ENCOUNTER — Ambulatory Visit: Payer: Medicare HMO

## 2017-12-09 VITALS — BP 132/72 | HR 60 | Temp 98.3°F | Resp 15 | Ht 61.5 in | Wt 242.8 lb

## 2017-12-09 DIAGNOSIS — Z0000 Encounter for general adult medical examination without abnormal findings: Secondary | ICD-10-CM

## 2017-12-09 DIAGNOSIS — E538 Deficiency of other specified B group vitamins: Secondary | ICD-10-CM

## 2017-12-09 MED ORDER — CYANOCOBALAMIN 1000 MCG/ML IJ SOLN
1000.0000 ug | Freq: Once | INTRAMUSCULAR | Status: AC
  Administered 2017-12-09: 1000 ug via INTRAMUSCULAR

## 2017-12-09 NOTE — Patient Instructions (Addendum)
  Beth Tyler , Thank you for taking time to come for your Medicare Wellness Visit. I appreciate your ongoing commitment to your health goals. Please review the following plan we discussed and let me know if I can assist you in the future.   These are the goals we discussed: Goals    . Healthy lifestyle     Low carb diet Stay active. Walk, dance, chair exercise as tolerated       This is a list of the screening recommended for you and due dates:  Health Maintenance  Topic Date Due  . Tetanus Vaccine  09/24/1964  . Pneumonia vaccines (2 of 2 - PPSV23) 06/12/2014  . Flu Shot  11/06/2017  . Complete foot exam   03/04/2018  . Mammogram  04/03/2018  . Hemoglobin A1C  04/08/2018  . Eye exam for diabetics  06/18/2018  . Colon Cancer Screening  11/28/2021  . DEXA scan (bone density measurement)  Completed  .  Hepatitis C: One time screening is recommended by Center for Disease Control  (CDC) for  adults born from [birth year] through 1966.   Completed

## 2017-12-09 NOTE — Progress Notes (Signed)
Subjective:   Beth Tyler is a 72 y.o. female who presents for Medicare Annual (Subsequent) preventive examination.  Review of Systems:  No ROS.  Medicare Wellness Visit. Additional risk factors are reflected in the social history. Cardiac Risk Factors include: advanced age (>89mn, >>46women);hypertension;obesity (BMI >30kg/m2);diabetes mellitus     Objective:     Vitals: BP 132/72 (BP Location: Left Arm, Patient Position: Sitting, Cuff Size: Normal)   Pulse 60   Temp 98.3 F (36.8 C) (Oral)   Resp 15   Ht 5' 1.5" (1.562 m)   Wt 242 lb 12.8 oz (110.1 kg)   SpO2 98%   BMI 45.13 kg/m   Body mass index is 45.13 kg/m.  Advanced Directives 12/09/2017 12/01/2017 10/13/2017 09/06/2016 05/21/2016 04/05/2016 03/11/2016  Does Patient Have a Medical Advance Directive? No No No Yes Yes Yes Yes  Type of Advance Directive - - -Public librarianLiving will - HHerefordLiving will HTiffinLiving will  Does patient want to make changes to medical advance directive? No - Patient declined - No - Patient declined - - No - Patient declined -  Copy of HManns Choicein Chart? - - - - - No - copy requested -  Would patient like information on creating a medical advance directive? - No - Patient declined No - Patient declined - - - -    Tobacco Social History   Tobacco Use  Smoking Status Never Smoker  Smokeless Tobacco Never Used     Counseling given: Not Answered   Clinical Intake:  Pre-visit preparation completed: Yes  Pain : No/denies pain     Nutritional Status: BMI > 30  Obese Diabetes: Yes(Followed by pcp)  How often do you need to have someone help you when you read instructions, pamphlets, or other written materials from your doctor or pharmacy?: 1 - Never  Interpreter Needed?: No     Past Medical History:  Diagnosis Date  . Anemia   . Arthritis   . B12 deficiency   . Carpal tunnel syndrome, bilateral    . Diabetes mellitus ([place] 02/17/2012  . Diabetes mellitus, type II ([place]   . Dyspnea   . Gastritis   . GERD (gastroesophageal reflux disease)   . Hypercholesterolemia   . Hypertension   . IBS (irritable bowel syndrome)   . Neuropathy    legs  . Pernicious anemia   . Vertigo    no episodes fo 9 or 10 yrs   Past Surgical History:  Procedure Laterality Date  . ANTERIOR VITRECTOMY Right 12/01/2017   Procedure: ANTERIOR VITRECTOMY;  Surgeon: KEulogio Bear MD;  [place] [facility]  Service: Ophthalmology;  Laterality: Right;  . BREAST BIOPSY Right 1980   neg  . BREAST SURGERY  1980   benign cyst removal  . CATARACT EXTRACTION W/PHACO Left 10/13/2017   Procedure: CATARACT EXTRACTION PHACO AND INTRAOCULAR LENS PLACEMENT ([place]  LEFT  DIABETES;  Surgeon: KEulogio Bear MD;  [place] [facility]  Service: Ophthalmology;  Laterality: Left;  Diabetic - insulin and oral meds  . CATARACT EXTRACTION W/PHACO Right 12/01/2017   Procedure: CATARACT EXTRACTION PHACO AND INTRAOCULAR LENS PLACEMENT ([place]  RIGHT DIABETIC;  Surgeon: KEulogio Bear MD;  [place] [facility]  Service: Ophthalmology;  Laterality: Right;  Diabetic - insulin and oral  . CHOLECYSTECTOMY  1983   Family History  Problem Relation Age of Onset  . Diabetes Father   .  Kidney disease Father   . Heart disease Father   . Alcohol abuse Mother   . Dementia Mother   . Diabetes Mother   . Diabetes Other   . Heart disease Son 54       CABG  . Hemophilia Brother   . Uterine cancer Sister   . Diabetes Sister   . COPD Sister   . Breast cancer Maternal Aunt 51  . Breast cancer Daughter 76  . Stroke Daughter    Social History   Socioeconomic History  . Marital status: Widowed    Spouse name: Not on file  . Number of children: 4  . Years of education: Not on file  . Highest education level: Not on file  Occupational History  . Not on file  Social Needs  . Financial resource  strain: Not hard at all  . Food insecurity:    Worry: Never true    Inability: Never true  . Transportation needs:    Medical: No    Non-medical: No  Tobacco Use  . Smoking status: Never Smoker  . Smokeless tobacco: Never Used  Substance and Sexual Activity  . Alcohol use: No    Alcohol/week: 0.0 standard drinks  . Drug use: No  . Sexual activity: Not Currently  Lifestyle  . Physical activity:    Days per week: Not on file    Minutes per session: Not on file  . Stress: Not on file  Relationships  . Social connections:    Talks on phone: Not on file    Gets together: Not on file    Attends religious service: Not on file    Active member of club or organization: Not on file    Attends meetings of clubs or organizations: Not on file    Relationship status: Not on file  Other Topics Concern  . Not on file  Social History Narrative   Lives with her husband. Has four children. Manager of an apartment building. No tobacco, alcohol or other drug use    Outpatient Encounter Medications as of 12/09/2017  Medication Sig  . amLODipine (NORVASC) 10 MG tablet TAKE 1 TABLET BY MOUTH ONCE DAILY  . aspirin 81 MG tablet Take 81 mg by mouth daily.  Marland Kitchen atenolol (TENORMIN) 100 MG tablet TAKE ONE TABLET BY MOUTH IN THE MORNING AND  TAKE  1/2  TABLET  IN  THE  EVENING  . Blood Glucose Monitoring Suppl ([place] FLEX SYSTEM) w/Device KIT   . cephALEXin (KEFLEX) 500 MG capsule Take 1 capsule (500 mg total) by mouth 2 (two) times daily for 10 days.  . ferrous fumarate (HEMOCYTE - 106 MG FE) 325 (106 FE) MG TABS tablet Take 1 tablet by mouth.  . fluticasone (CUTIVATE) 0.05 % cream USE AS DIRECTED  . Fluticasone-Salmeterol (ADVAIR DISKUS) 250-50 MCG/DOSE AEPB Inhale 1 puff into the lungs 2 (two) times daily.  Marland Kitchen glipiZIDE (GLIPIZIDE XL) 5 MG 24 hr tablet TAKE 1 TABLET BY MOUTH ONCE DAILY WITH  BREAKFAST  . hydrochlorothiazide (HYDRODIURIL) 25 MG tablet TAKE ONE TABLET BY MOUTH ONCE DAILY  .  Insulin Detemir (LEVEMIR) 100 UNIT/ML Pen Inject 24 units in the morning & 12 units at bedtime  . Insulin Pen Needle 32G X 8 MM MISC Use as directed three times daily Dx E11.9  . lisinopril (PRINIVIL,ZESTRIL) 20 MG tablet TAKE 1 TABLET BY MOUTH IN THE MORNING AND 2 AT BEDTIME  . magnesium oxide (MAG-OX) 400 (241.3 Mg) MG tablet TAKE ONE  TABLET BY MOUTH ONCE DAILY  . metFORMIN (GLUCOPHAGE) 1000 MG tablet TAKE 1 TABLET BY MOUTH TWICE DAILY WITH  MEALS  . ONETOUCH DELICA LANCETS 15Q MISC   . ONETOUCH VERIO test strip   . pantoprazole (PROTONIX) 40 MG tablet TAKE 1 TABLET BY MOUTH TWICE DAILY BEFORE MEAL(S)  . pioglitazone (ACTOS) 30 MG tablet TAKE 1 TABLET BY MOUTH ONCE DAILY  . simvastatin (ZOCOR) 10 MG tablet TAKE 1 TABLET BY MOUTH AT BEDTIME  . triamcinolone cream (KENALOG) 0.1 % APPLY  CREAM EXTERNALLY TWICE DAILY  . cholestyramine light (PREVALITE) 4 g packet Take by mouth.  . colestipol (COLESTID) 1 g tablet Take by mouth.  . tiotropium (SPIRIVA) 18 MCG inhalation capsule Place 1 capsule (18 mcg total) into inhaler and inhale daily. (Patient not taking: Reported on 11/19/2017)  . [EXPIRED] cyanocobalamin ((VITAMIN B-12)) injection 1,000 mcg    No facility-administered encounter medications on file as of 12/09/2017.     Activities of Daily Living In your present state of health, do you have any difficulty performing the following activities: 12/09/2017 12/01/2017  Hearing? N N  Vision? N N  Difficulty concentrating or making decisions? N N  Walking or climbing stairs? Y N  Comment Leg pain, chronic. Intermittent. Denies pain today.  -  Dressing or bathing? N N  Doing errands, shopping? N -  Preparing Food and eating ? N -  Using the Toilet? N -  In the past six months, have you accidently leaked urine? N -  Do you have problems with loss of bowel control? N -  Managing your Medications? N -  Managing your Finances? N -  Housekeeping or managing your Housekeeping? Y -  Comment Family  assists with house keeping as needed -  Some recent data might be hidden    Patient Care Team: Einar Pheasant, MD as PCP - General (Internal Medicine)    Assessment:   This is a routine wellness examination for Kaleia.  The goal of the wellness visit is to assist the patient how to close the gaps in care and create a preventative care plan for the patient.   No acute issues today.  Feels overall ok today despite her recent visit to urgent care for not feeling well.  Currently taking Keflex 534m.  All health maintenance vaccines postponed today.   The roster of all physicians providing medical care to patient is listed in the Snapshot section of the chart.  Osteoporosis risk reviewed.    Safety issues reviewed; Smoke and carbon monoxide detectors in the home. No firearms in the home. Wears seatbelts when driving or riding with others. No violence in the home.  They do not have excessive sun exposure.  Discussed the need for sun protection: hats, long sleeves and the use of sunscreen if there is significant sun exposure.  Patient is alert, normal appearance, oriented to person/place/and time.  Correctly identified the president of the [place] recalls of 2/3 words. Performs simple calculations and can read correct time from watch face.  Displays appropriate judgement.  No new identified risk were noted.  No failures at ADL's or IADL's.  Ambulates with walker as needed.   BMI- discussed the importance of a healthy diet, water intake and the benefits of aerobic exercise. She tries to have a healthy diet.  Walks and dances in the home for exercise on commercials over the course of the day and has sufficient water intake.   24 hour diet recall: Regular diet  Dental- every 6 months.  Sleep patterns- Sleeps 7 hours at night.  Wakes feeling rested. .  B12 administered L deltoid per pcp orders, tolerated well. Educational material provided. No verbal complaint or grimace during  injection. Schedule next monthly injection as directed.   Health maintenance discussed. Deferred per patient request.    Patient Concerns: Awaiting insurance coverage/preauthorization for prevalite, colestid, and spiriva. [facility] medication management [phone] number provided that may assist with cost.   Exercise Activities and Dietary recommendations Current Exercise Habits: Home exercise routine, Type of exercise: walking(Chair exercises, dancing), Time (Minutes): 10, Frequency (Times/Week): 3, Weekly Exercise (Minutes/Week): 30, Intensity: Mild  Goals    . Healthy lifestyle     Low carb diet Stay active. Walk, dance, chair exercise as tolerated       Fall Risk Fall Risk  12/09/2017 12/17/2016 04/05/2016 02/23/2016 04/06/2015  Falls in the past year? No Yes No No Yes  Number falls in past yr: - 2 or more - - 2 or more  Injury with Fall? - Yes - - No  Follow up - - - - Education provided;Falls prevention discussed   Depression Screen PHQ 2/9 Scores 12/09/2017 12/17/2016 04/05/2016 02/23/2016  PHQ - 2 Score 0 0 0 0  PHQ- 9 Score - 0 - -     Cognitive Function MMSE - Mini Mental State Exam 12/09/2017 04/05/2016 04/06/2015  Orientation to time 5 5 5   Orientation to Place 5 5 5   Registration 3 3 3   Attention/ Calculation 5 5 5   Recall 3 3 3   Language- name 2 objects 2 2 2   Language- repeat 1 1 1   Language- follow 3 step command 3 3 3   Language- read & follow direction 1 1 1   Write a sentence 1 1 1   Copy design 1 1 1   Total score 30 30 30         Immunization History  Administered Date(s) Administered  . Influenza Split 02/04/2012  . Influenza, High Dose Seasonal PF 12/19/2015, 12/17/2016  . Influenza,inj,Quad PF,6+ Mos 01/22/2013, 01/19/2014, 12/29/2014  . Pneumococcal Conjugate-13 02/06/2010, 06/11/2013   Screening Tests Health Maintenance  Topic Date Due  . TETANUS/TDAP  09/24/1963  . PNA vac Low Risk Adult (2 of 2 - PPSV23) 06/12/2014  . INFLUENZA VACCINE   11/06/2017  . FOOT EXAM  03/04/2018  . MAMMOGRAM  04/03/2018  . HEMOGLOBIN A1C  04/08/2018  . OPHTHALMOLOGY EXAM  06/18/2018  . COLONOSCOPY  11/28/2021  . DEXA SCAN  Completed  . Hepatitis C Screening  Completed      Plan:   End of life planning; Advanced aging; Advanced directives discussed.  No HCPOA/Living Will.  Additional information declined at this time.  I have personally reviewed and noted the following in the patient's chart:   . Medical and social history . Use of alcohol, tobacco or illicit drugs  . Current medications and supplements . Functional ability and status . Nutritional status . Physical activity . Advanced directives . List of other physicians . Hospitalizations, surgeries, and ER visits in previous 12 months . Vitals . Screenings to include cognitive, depression, and falls . Referrals and appointments  In addition, I have reviewed and discussed with patient certain preventive protocols, quality metrics, and best practice recommendations. A written personalized care plan for preventive services as well as general preventive health recommendations were provided to patient.     Varney Biles, LPN  08/14/1636   Reviewed above information.  Agree with assessment and plan.  Dr Nicki Reaper

## 2017-12-10 ENCOUNTER — Telehealth: Payer: Self-pay

## 2017-12-10 ENCOUNTER — Telehealth: Payer: Self-pay | Admitting: *Deleted

## 2017-12-10 NOTE — Telephone Encounter (Signed)
Pt can't afford $142. She has been advised to contact her insurance to find out which medication is covered. She will back.

## 2017-12-10 NOTE — Telephone Encounter (Signed)
Left a message for patient to call Medication Management regarding possible assistance with cost of medications located at [facility]. The contact phone number is [phone].  OK for PEC to read this message to the patient.

## 2017-12-15 ENCOUNTER — Other Ambulatory Visit: Payer: Self-pay | Admitting: Internal Medicine

## 2017-12-17 DIAGNOSIS — R69 Illness, unspecified: Secondary | ICD-10-CM | POA: Diagnosis not present

## 2017-12-25 ENCOUNTER — Ambulatory Visit: Payer: Medicare HMO

## 2017-12-29 ENCOUNTER — Ambulatory Visit
Admission: RE | Admit: 2017-12-29 | Discharge: 2017-12-29 | Disposition: A | Discharge status: Home / Self Care | Payer: Medicare HMO | Source: Ambulatory Visit | Attending: Internal Medicine | Admitting: Internal Medicine

## 2017-12-29 DIAGNOSIS — J849 Interstitial pulmonary disease, unspecified: Secondary | ICD-10-CM

## 2017-12-29 DIAGNOSIS — I700 Atherosclerosis of aorta: Secondary | ICD-10-CM | POA: Diagnosis not present

## 2017-12-29 DIAGNOSIS — I2510 Atherosclerotic heart disease of native coronary artery without angina pectoris: Secondary | ICD-10-CM | POA: Diagnosis not present

## 2018-01-01 ENCOUNTER — Encounter: Payer: Self-pay | Admitting: Internal Medicine

## 2018-01-01 ENCOUNTER — Ambulatory Visit: Payer: Medicare HMO | Attending: Internal Medicine

## 2018-01-01 ENCOUNTER — Ambulatory Visit: Payer: Medicare HMO | Admitting: Internal Medicine

## 2018-01-01 VITALS — BP 134/88 | HR 64 | Resp 16 | Ht 61.5 in | Wt 241.0 lb

## 2018-01-01 DIAGNOSIS — G4719 Other hypersomnia: Secondary | ICD-10-CM

## 2018-01-01 DIAGNOSIS — R0609 Other forms of dyspnea: Secondary | ICD-10-CM | POA: Diagnosis not present

## 2018-01-01 DIAGNOSIS — J849 Interstitial pulmonary disease, unspecified: Secondary | ICD-10-CM | POA: Insufficient documentation

## 2018-01-01 MED ORDER — ALBUTEROL SULFATE (2.5 MG/3ML) 0.083% IN NEBU
2.5000 mg | INHALATION_SOLUTION | Freq: Once | RESPIRATORY_TRACT | Status: AC
  Administered 2018-01-01: 2.5 mg via RESPIRATORY_TRACT
  Filled 2018-01-01: qty 3

## 2018-01-01 MED ORDER — FLUTICASONE FUROATE-VILANTEROL 200-25 MCG/INH IN AEPB: 1.0000 | INHALATION_SPRAY | Freq: Every day | RESPIRATORY_TRACT | 0 refills | Status: DC

## 2018-01-01 NOTE — Addendum Note (Signed)
Addended by: Janean Sark on: 01/01/2018 04:13 PM   Modules accepted: Orders

## 2018-01-01 NOTE — Patient Instructions (Addendum)
Will try you on a Breo inhaler sample for one week. Use one puff daily (rinse mouth after use).  If it helps can continue on previously prescibed inhaler. If does not help can discontinue inhaler.  Check on the cost of a sleep study and if it is something you can afford let us know and we will order the test.

## 2018-01-01 NOTE — Progress Notes (Signed)
[facility]    Assessment and Plan:  Dyspnea on exertion. - Likely multifactorial from above, bibasilar atelectasis related to obesity. -Weight loss will be beneficial. --will try empiric inhaler sample, to see if helps.   Daytime sleepiness. - She was referred for a sleep study in 2018 but declined due to cost.  --Offered to refer her again, she will look into how much it will cost her, and if amenable we can refer her for a sleep study.   Bronchiectasis -s/p  CT high-resolution which showed minimal changes, no need for intervention at this time.     Return if symptoms worsen or fail to improve.    Date: 01/01/2018  MRN# [id number] KEANI GOTCHER 1945-06-23   OFILIA RAYON is a 72 y.o. old female seen in consultation for chief complaint of:    Chief Complaint  Patient presents with  . Recurrent Pneumonia    pt had PFT and CT scan.  . Shortness of Breath    no change in breathing    HPI:  The patient is a 72 year old female, she was initially referred due to exertional dyspnea, she had a chest x-ray which was suggestive of bibasilar interstitial changes versus obesity.  She was sent for CT high-resolution which showed no evidence of interstitial changes. At last visit she was started empirically on Advair, however this was not covered, therefore changed to Spiriva.  Since last visit she has not noticed any change in her breathing, she has not yet tried the inhaler because of the cost.   She continues to snore at night, she remains mildly sleepy during the day, she nods off for 20 to 30 min per day, she has never been tested for OSA. She has chronic lymphedema of both LE, she follows with vascular surgery.   **PFT 01/01/2018>> FVC is 81% predicted, there is 80% improvement with bronchodilator, FEV1 is 87% predicted, there is 9% improvement with bronchodilator.  Ratio is 84%.  Flow volume loop is normal.  TLC is 79% predicted, RV is 72% predicted, RV  ratio is normal.  DLCO severely reduced at 33%. -Overall this test shows normal pulmonary volumes without evidence of COPD/emphysema, however the DLCO was severely reduced at 33%. **CT chest 12/29/2017>> images personally reviewed.  Bibasilar changes of bronchiectasis.  Significant interstitial lung disease noted. **Chest x-ray 10/10/2017>> images personally reviewed, bibasilar interstitial changes, though this may be obscured by obesity.  There is bilateral mid zone and upper lobe bronchiectatic changes.  Changes of chronic bronchitis.  Review of lung cuts from CT abdomen pelvis from 10/22/2016 showed normal lung bases. **CBC 10/10/2017>> absolute eosinophil count 400.  Medication:    Current Outpatient Medications:  .  amLODipine (NORVASC) 10 MG tablet, TAKE 1 TABLET BY MOUTH ONCE DAILY, Disp: 90 tablet, Rfl: 1 .  aspirin 81 MG tablet, Take 81 mg by mouth daily., Disp: , Rfl:  .  atenolol (TENORMIN) 100 MG tablet, TAKE ONE TABLET BY MOUTH IN THE MORNING AND  TAKE  1/2  TABLET  IN  THE  EVENING, Disp: 135 tablet, Rfl: 3 .  Blood Glucose Monitoring Suppl (ONETOUCH VERIO FLEX SYSTEM) w/Device KIT, , Disp: , Rfl: 0 .  cholestyramine light (PREVALITE) 4 g packet, Take by mouth., Disp: , Rfl:  .  colestipol (COLESTID) 1 g tablet, Take by mouth., Disp: , Rfl:  .  ferrous fumarate (HEMOCYTE - 106 MG FE) 325 (106 FE) MG TABS tablet, Take 1 tablet by mouth., Disp: , Rfl:  .  fluticasone (CUTIVATE) 0.05 % cream, USE AS DIRECTED, Disp: 30 g, Rfl: 0 .  Fluticasone-Salmeterol (ADVAIR DISKUS) 250-50 MCG/DOSE AEPB, Inhale 1 puff into the lungs 2 (two) times daily., Disp: 60 each, Rfl: 3 .  glipiZIDE (GLIPIZIDE XL) 5 MG 24 hr tablet, TAKE 1 TABLET BY MOUTH ONCE DAILY WITH  BREAKFAST, Disp: 90 tablet, Rfl: 1 .  hydrochlorothiazide (HYDRODIURIL) 25 MG tablet, TAKE ONE TABLET BY MOUTH ONCE DAILY, Disp: 90 tablet, Rfl: 3 .  Insulin Detemir (LEVEMIR) 100 UNIT/ML Pen, Inject 24 units in the morning & 12 units at bedtime,  Disp: 15 mL, Rfl: 11 .  Insulin Pen Needle 32G X 8 MM MISC, Use as directed three times daily Dx E11.9, Disp: 300 each, Rfl: 1 .  lisinopril (PRINIVIL,ZESTRIL) 20 MG tablet, TAKE 1 TABLET BY MOUTH IN THE MORNING AND 2 AT BEDTIME, Disp: 270 tablet, Rfl: 1 .  magnesium oxide (MAG-OX) 400 (241.3 Mg) MG tablet, TAKE ONE TABLET BY MOUTH ONCE DAILY, Disp: 90 tablet, Rfl: 3 .  metFORMIN (GLUCOPHAGE) 1000 MG tablet, TAKE 1 TABLET BY MOUTH TWICE DAILY WITH  MEALS, Disp: 180 tablet, Rfl: 1 .  ONETOUCH DELICA LANCETS 16X MISC, , Disp: , Rfl: 99 .  ONETOUCH VERIO test strip, , Disp: , Rfl: 99 .  pantoprazole (PROTONIX) 40 MG tablet, TAKE 1 TABLET BY MOUTH TWICE DAILY BEFORE MEAL(S), Disp: 180 tablet, Rfl: 2 .  pioglitazone (ACTOS) 30 MG tablet, TAKE 1 TABLET BY MOUTH ONCE DAILY, Disp: 90 tablet, Rfl: 1 .  simvastatin (ZOCOR) 10 MG tablet, TAKE 1 TABLET BY MOUTH AT BEDTIME, Disp: 90 tablet, Rfl: 0 .  tiotropium (SPIRIVA) 18 MCG inhalation capsule, Place 1 capsule (18 mcg total) into inhaler and inhale daily. (Patient not taking: Reported on 11/19/2017), Disp: 30 capsule, Rfl: 12 .  triamcinolone cream (KENALOG) 0.1 %, APPLY  CREAM EXTERNALLY TWICE DAILY, Disp: 45 g, Rfl: 0   Allergies:  Hydrocodone-acetaminophen; Percocet [oxycodone-acetaminophen]; Demeclocycline; Penicillins; and Tetracyclines & related  Review of Systems:  Constitutional: Feels well. Cardiovascular: Denies chest pain, exertional chest pain.  Pulmonary: Denies hemoptysis, pleuritic chest pain.   The remainder of systems were reviewed and were found to be negative other than what is documented in the HPI.    Physical Examination:   VS: BP 134/88 (BP Location: Left Arm, Cuff Size: Large)   Pulse 64   Resp 16   Ht 5' 1.5" (1.562 m)   Wt 241 lb (109.3 kg)   SpO2 94%   BMI 44.80 kg/m   General Appearance: No distress  Neuro:without focal findings, mental status, speech normal, alert and oriented HEENT: PERRLA, EOM  intact Pulmonary: No wheezing, No rales  CardiovascularNormal S1,S2.  No m/r/g.  Abdomen: Benign, Soft, non-tender, No masses Renal:  No costovertebral tenderness  GU:  No performed at this time. Endoc: No evident thyromegaly, no signs of acromegaly or Cushing features Skin:   warm, no rashes, no ecchymosis  Extremities: normal, no cyanosis, clubbing.      LABORATORY PANEL:   CBC No results for input(s): WBC, HGB, HCT, PLT in the last 168 hours. ------------------------------------------------------------------------------------------------------------------  Chemistries  No results for input(s): NA, K, CL, CO2, GLUCOSE, BUN, CREATININE, CALCIUM, MG, AST, ALT, ALKPHOS, BILITOT in the last 168 hours.  Invalid input(s): GFRCGP ------------------------------------------------------------------------------------------------------------------  Cardiac Enzymes No results for input(s): TROPONINI in the last 168 hours. ------------------------------------------------------------  RADIOLOGY:  No results found.     Thank  you for the consultation and for allowing [facility] to assist in the care of your patient. Our recommendations are noted above.  Please contact us if we can be of further service.   Marda Stalker, M.D., F.C.C.P.  Board Certified in Internal Medicine, Pulmonary Medicine, [place], and Sleep Medicine.  [facility] Office Number: [phone]   01/01/2018

## 2018-01-03 ENCOUNTER — Other Ambulatory Visit: Payer: Self-pay | Admitting: Internal Medicine

## 2018-01-06 DIAGNOSIS — Z6841 Body Mass Index (BMI) 40.0 and over, adult: Secondary | ICD-10-CM | POA: Diagnosis not present

## 2018-01-06 DIAGNOSIS — E1169 Type 2 diabetes mellitus with other specified complication: Secondary | ICD-10-CM | POA: Diagnosis not present

## 2018-01-06 DIAGNOSIS — M65332 Trigger finger, left middle finger: Secondary | ICD-10-CM | POA: Diagnosis not present

## 2018-01-07 ENCOUNTER — Other Ambulatory Visit: Payer: Self-pay | Admitting: Internal Medicine

## 2018-01-08 ENCOUNTER — Ambulatory Visit (INDEPENDENT_AMBULATORY_CARE_PROVIDER_SITE_OTHER): Payer: Medicare HMO

## 2018-01-08 DIAGNOSIS — E538 Deficiency of other specified B group vitamins: Secondary | ICD-10-CM | POA: Diagnosis not present

## 2018-01-08 DIAGNOSIS — Z23 Encounter for immunization: Secondary | ICD-10-CM | POA: Diagnosis not present

## 2018-01-08 MED ORDER — CYANOCOBALAMIN 1000 MCG/ML IJ SOLN
1000.0000 ug | Freq: Once | INTRAMUSCULAR | Status: AC
  Administered 2018-01-08: 1000 ug via INTRAMUSCULAR

## 2018-01-08 NOTE — Progress Notes (Signed)
Pt was here today for a NV to receive her flu and b-12 shot.   B-12 shot given in RD   High dose flu given in LD   Pt tolerated well.

## 2018-01-26 ENCOUNTER — Ambulatory Visit: Payer: Medicare HMO | Admitting: Internal Medicine

## 2018-01-30 ENCOUNTER — Telehealth: Payer: Self-pay | Admitting: *Deleted

## 2018-01-30 NOTE — Telephone Encounter (Signed)
Copied from CRM [id number]. Topic: General - Other >> Jan 30, 2018 10:44 AM Marylen Ponto wrote: Reason for CRM: Pt states her 3 month follow up appt was cancelled and she was told to call back to reschedule. Pt has appt scheduled for 05/18/18 at 10 am and she states she was suppose to have labs done a week before her appt but there are no lab orders.

## 2018-02-02 DIAGNOSIS — Z961 Presence of intraocular lens: Secondary | ICD-10-CM | POA: Diagnosis not present

## 2018-02-02 NOTE — Telephone Encounter (Signed)
Is she ok to wait until feb to be seen or would you like her to be scheduled sooner?

## 2018-02-02 NOTE — Telephone Encounter (Signed)
Confirm she is doing ok.  I would like for her to be scheduled for earlier appt (in the next 3-4 weeks).  Ok to schedule fasting labs 1-2 days prior to appt.

## 2018-02-03 NOTE — Telephone Encounter (Signed)
Patient scheduled for labs and f/u appt in november

## 2018-02-05 ENCOUNTER — Telehealth: Payer: Self-pay | Admitting: Radiology

## 2018-02-05 ENCOUNTER — Other Ambulatory Visit: Payer: Self-pay | Admitting: Internal Medicine

## 2018-02-05 DIAGNOSIS — E1140 Type 2 diabetes mellitus with diabetic neuropathy, unspecified: Secondary | ICD-10-CM

## 2018-02-05 DIAGNOSIS — Z794 Long term (current) use of insulin: Principal | ICD-10-CM

## 2018-02-05 DIAGNOSIS — I10 Essential (primary) hypertension: Secondary | ICD-10-CM

## 2018-02-05 DIAGNOSIS — E785 Hyperlipidemia, unspecified: Secondary | ICD-10-CM

## 2018-02-05 NOTE — Telephone Encounter (Signed)
Pt coming in for labs Monday, please place future orders. Thank you 

## 2018-02-05 NOTE — Progress Notes (Signed)
Order placed for f/u labs.  

## 2018-02-05 NOTE — Telephone Encounter (Signed)
Order placed for f/u labs.  

## 2018-02-09 ENCOUNTER — Other Ambulatory Visit (INDEPENDENT_AMBULATORY_CARE_PROVIDER_SITE_OTHER): Payer: Medicare HMO

## 2018-02-09 ENCOUNTER — Other Ambulatory Visit: Payer: Self-pay | Admitting: Internal Medicine

## 2018-02-09 DIAGNOSIS — E785 Hyperlipidemia, unspecified: Secondary | ICD-10-CM

## 2018-02-09 DIAGNOSIS — E1140 Type 2 diabetes mellitus with diabetic neuropathy, unspecified: Secondary | ICD-10-CM

## 2018-02-09 DIAGNOSIS — E875 Hyperkalemia: Secondary | ICD-10-CM

## 2018-02-09 DIAGNOSIS — I10 Essential (primary) hypertension: Secondary | ICD-10-CM

## 2018-02-09 DIAGNOSIS — Z794 Long term (current) use of insulin: Secondary | ICD-10-CM | POA: Diagnosis not present

## 2018-02-09 LAB — BASIC METABOLIC PANEL
BUN: 23 mg/dL (ref 6–23)
CALCIUM: 9.6 mg/dL (ref 8.4–10.5)
CO2: 29 mEq/L (ref 19–32)
CREATININE: 0.92 mg/dL (ref 0.40–1.20)
Chloride: 104 mEq/L (ref 96–112)
GFR: 63.53 mL/min (ref >60.00)
Glucose, Bld: 168 mg/dL — ABNORMAL HIGH (ref 70–99)
POTASSIUM: 5.5 meq/L — AB (ref 3.5–5.1)
Sodium: 139 mEq/L (ref 135–145)

## 2018-02-09 LAB — HEPATIC FUNCTION PANEL
ALBUMIN: 4.3 g/dL (ref 3.5–5.2)
ALK PHOS: 72 U/L (ref 39–117)
ALT: 12 U/L (ref 0–35)
AST: 13 U/L (ref 0–37)
BILIRUBIN DIRECT: 0.1 mg/dL (ref 0.0–0.3)
TOTAL PROTEIN: 7.2 g/dL (ref 6.0–8.3)
Total Bilirubin: 0.4 mg/dL (ref 0.2–1.2)

## 2018-02-09 LAB — LIPID PANEL
CHOLESTEROL: 109 mg/dL (ref 0–200)
HDL: 55.5 mg/dL (ref >39.00)
LDL Cholesterol: 37 mg/dL (ref 0–99)
NonHDL: 53.75
Total CHOL/HDL Ratio: 2
Triglycerides: 82 mg/dL (ref 0.0–149.0)
VLDL: 16.4 mg/dL (ref 0.0–40.0)

## 2018-02-09 LAB — HEMOGLOBIN A1C: Hgb A1c MFr Bld: 7.8 % — ABNORMAL HIGH (ref 4.6–6.5)

## 2018-02-09 NOTE — Progress Notes (Signed)
Order placed for f/u potassium.  

## 2018-02-10 ENCOUNTER — Other Ambulatory Visit: Payer: Self-pay | Admitting: Internal Medicine

## 2018-02-10 ENCOUNTER — Other Ambulatory Visit
Admission: RE | Admit: 2018-02-10 | Discharge: 2018-02-10 | Disposition: A | Discharge status: Home / Self Care | Payer: Medicare HMO | Source: Ambulatory Visit | Attending: Internal Medicine | Admitting: Internal Medicine

## 2018-02-10 ENCOUNTER — Ambulatory Visit: Payer: Medicare HMO

## 2018-02-10 ENCOUNTER — Encounter: Payer: Self-pay | Admitting: Internal Medicine

## 2018-02-10 DIAGNOSIS — R0602 Shortness of breath: Secondary | ICD-10-CM | POA: Diagnosis not present

## 2018-02-10 DIAGNOSIS — E875 Hyperkalemia: Secondary | ICD-10-CM

## 2018-02-10 DIAGNOSIS — I10 Essential (primary) hypertension: Secondary | ICD-10-CM | POA: Diagnosis not present

## 2018-02-10 DIAGNOSIS — I890 Lymphedema, not elsewhere classified: Secondary | ICD-10-CM | POA: Diagnosis not present

## 2018-02-10 LAB — POTASSIUM: Potassium: 4.6 mmol/L (ref 3.5–5.1)

## 2018-02-10 NOTE — Progress Notes (Signed)
Lab order placed for stat potassium in [place].

## 2018-02-11 ENCOUNTER — Encounter: Payer: Self-pay | Admitting: Internal Medicine

## 2018-02-11 ENCOUNTER — Ambulatory Visit (INDEPENDENT_AMBULATORY_CARE_PROVIDER_SITE_OTHER): Payer: Medicare HMO | Admitting: Internal Medicine

## 2018-02-11 VITALS — BP 136/78 | HR 60 | Temp 98.1°F | Resp 18 | Wt 246.0 lb

## 2018-02-11 DIAGNOSIS — Z23 Encounter for immunization: Secondary | ICD-10-CM

## 2018-02-11 DIAGNOSIS — E538 Deficiency of other specified B group vitamins: Secondary | ICD-10-CM | POA: Diagnosis not present

## 2018-02-11 DIAGNOSIS — E1140 Type 2 diabetes mellitus with diabetic neuropathy, unspecified: Secondary | ICD-10-CM | POA: Diagnosis not present

## 2018-02-11 DIAGNOSIS — Z6841 Body Mass Index (BMI) 40.0 and over, adult: Secondary | ICD-10-CM | POA: Diagnosis not present

## 2018-02-11 DIAGNOSIS — E785 Hyperlipidemia, unspecified: Secondary | ICD-10-CM | POA: Diagnosis not present

## 2018-02-11 DIAGNOSIS — E875 Hyperkalemia: Secondary | ICD-10-CM | POA: Diagnosis not present

## 2018-02-11 DIAGNOSIS — Z794 Long term (current) use of insulin: Secondary | ICD-10-CM

## 2018-02-11 DIAGNOSIS — I700 Atherosclerosis of aorta: Secondary | ICD-10-CM

## 2018-02-11 DIAGNOSIS — R69 Illness, unspecified: Secondary | ICD-10-CM | POA: Diagnosis not present

## 2018-02-11 DIAGNOSIS — F439 Reaction to severe stress, unspecified: Secondary | ICD-10-CM

## 2018-02-11 DIAGNOSIS — R600 Localized edema: Secondary | ICD-10-CM | POA: Diagnosis not present

## 2018-02-11 DIAGNOSIS — I10 Essential (primary) hypertension: Secondary | ICD-10-CM

## 2018-02-11 MED ORDER — CYANOCOBALAMIN 1000 MCG/ML IJ SOLN
1000.0000 ug | Freq: Once | INTRAMUSCULAR | Status: AC
  Administered 2018-02-11: 1000 ug via INTRAMUSCULAR

## 2018-02-11 NOTE — Progress Notes (Signed)
Patient ID: Beth Tyler, female   DOB: July 23, 1945, 72 y.o.   MRN: [id number]   Subjective:    Patient ID: Beth Tyler, female    DOB: 11-26-1945, 72 y.o.   MRN: [id number]  HPI  Patient here for a scheduled follow up.  Just saw cardiology yesterday.   Had echo 08/2017 with EF 55% and mild to moderate tricuspid regurgitation.  Felt things were stable.  No changes made.  Recommended f/u in 6 months.  Increased stress.  She feels she is handling things relatively well.  Does not feel needs any further intervention.  Has good support.  No chest pain.  Breathing stable.  Saw Dr Leanor Kail.  S/p injection for trigger finger.  Better.  Had f/u with pulmonary 12/2017.  Note reviewed.  Not using Breo or advair. Felt did not make a difference.  Not using.  Discussed sleep study.  She wants to hold.  Is s/p bilateral cataract surgery.  Right eye doing well.  Planning to see a retinal specialist for left eye.  Discussed labs.  a1c 7.8.  States AM sugars 120s.  PM sugars - most 200.  She is trying to adjust her diet.  Discussed appropriate snacks.  Discussed adjusting her long acting insulin.  Also discussed other diabetic medications.  Discussed referral to pharmacist for further treatment recs.  She was in agreement.     Past Medical History:  Diagnosis Date  . Anemia   . Arthritis   . B12 deficiency   . Carpal tunnel syndrome, bilateral   . Diabetes mellitus ([place]) 02/17/2012  . Diabetes mellitus, type II ([place])   . Dyspnea   . Gastritis   . GERD (gastroesophageal reflux disease)   . Hypercholesterolemia   . Hypertension   . IBS (irritable bowel syndrome)   . Neuropathy    legs  . Pernicious anemia   . Vertigo    no episodes fo 9 or 10 yrs   Past Surgical History:  Procedure Laterality Date  . ANTERIOR VITRECTOMY Right 12/01/2017   Procedure: ANTERIOR VITRECTOMY;  Surgeon: Eulogio Bear, MD;  Location: [facility];  Service: Ophthalmology;  Laterality: Right;  . BREAST  BIOPSY Right 1980   neg  . BREAST SURGERY  1980   benign cyst removal  . CATARACT EXTRACTION W/PHACO Left 10/13/2017   Procedure: CATARACT EXTRACTION PHACO AND INTRAOCULAR LENS PLACEMENT ([place])  LEFT  DIABETES;  Surgeon: Eulogio Bear, MD;  Location: [facility];  Service: Ophthalmology;  Laterality: Left;  Diabetic - insulin and oral meds  . CATARACT EXTRACTION W/PHACO Right 12/01/2017   Procedure: CATARACT EXTRACTION PHACO AND INTRAOCULAR LENS PLACEMENT ([place])  RIGHT DIABETIC;  Surgeon: Eulogio Bear, MD;  Location: [facility];  Service: Ophthalmology;  Laterality: Right;  Diabetic - insulin and oral  . CHOLECYSTECTOMY  1983   Family History  Problem Relation Age of Onset  . Diabetes Father   . Kidney disease Father   . Heart disease Father   . Alcohol abuse Mother   . Dementia Mother   . Diabetes Mother   . Diabetes Other   . Heart disease Son 21       CABG  . Hemophilia Brother   . Uterine cancer Sister   . Diabetes Sister   . COPD Sister   . Breast cancer Maternal Aunt 51  . Breast cancer Daughter 75  . Stroke Daughter    Social History   Socioeconomic History  . Marital  status: Widowed    Spouse name: Not on file  . Number of children: 4  . Years of education: Not on file  . Highest education level: Not on file  Occupational History  . Not on file  Social Needs  . Financial resource strain: Not hard at all  . Food insecurity:    Worry: Never true    Inability: Never true  . Transportation needs:    Medical: No    Non-medical: No  Tobacco Use  . Smoking status: Never Smoker  . Smokeless tobacco: Never Used  Substance and Sexual Activity  . Alcohol use: No    Alcohol/week: 0.0 standard drinks  . Drug use: No  . Sexual activity: Not Currently  Lifestyle  . Physical activity:    Days per week: Not on file    Minutes per session: Not on file  . Stress: Not on file  Relationships  . Social connections:    Talks on phone: Not on file      Gets together: Not on file    Attends religious service: Not on file    Active member of club or organization: Not on file    Attends meetings of clubs or organizations: Not on file    Relationship status: Not on file  Other Topics Concern  . Not on file  Social History Narrative   Lives with her husband. Has four children. Manager of an apartment building. No tobacco, alcohol or other drug use    Outpatient Encounter Medications as of 02/11/2018  Medication Sig  . amLODipine (NORVASC) 10 MG tablet TAKE 1 TABLET BY MOUTH ONCE DAILY  . atenolol (TENORMIN) 100 MG tablet TAKE ONE TABLET BY MOUTH IN THE MORNING AND  TAKE  1/2  TABLET  IN  THE  EVENING  . Blood Glucose Monitoring Suppl ([place] FLEX SYSTEM) w/Device KIT   . fluticasone (CUTIVATE) 0.05 % cream USE AS DIRECTED  . fluticasone furoate-vilanterol (BREO ELLIPTA) 200-25 MCG/INH AEPB Inhale 1 puff into the lungs daily.  . Fluticasone-Salmeterol (ADVAIR DISKUS) 250-50 MCG/DOSE AEPB Inhale 1 puff into the lungs 2 (two) times daily. (Patient not taking: Reported on 01/01/2018)  . glipiZIDE (GLIPIZIDE XL) 5 MG 24 hr tablet TAKE 1 TABLET BY MOUTH ONCE DAILY WITH  BREAKFAST  . hydrochlorothiazide (HYDRODIURIL) 25 MG tablet TAKE ONE TABLET BY MOUTH ONCE DAILY  . Insulin Detemir (LEVEMIR) 100 UNIT/ML Pen Inject 24 units in the morning & 12 units at bedtime  . Insulin Pen Needle 32G X 8 MM MISC Use as directed three times daily Dx E11.9  . lisinopril (PRINIVIL,ZESTRIL) 20 MG tablet TAKE 1 TABLET BY MOUTH ONCE DAILY IN THE MORNING AND 2 AT BEDTIME  . magnesium oxide (MAG-OX) 400 (241.3 Mg) MG tablet TAKE 1 TABLET BY MOUTH ONCE DAILY  . metFORMIN (GLUCOPHAGE) 1000 MG tablet TAKE 1 TABLET BY MOUTH TWICE DAILY WITH MEALS  . ONETOUCH DELICA LANCETS 81O MISC   . ONETOUCH VERIO test strip   . pantoprazole (PROTONIX) 40 MG tablet TAKE 1 TABLET BY MOUTH TWICE DAILY BEFORE MEAL(S)  . pioglitazone (ACTOS) 30 MG tablet TAKE 1 TABLET BY MOUTH  ONCE DAILY  . simvastatin (ZOCOR) 10 MG tablet TAKE 1 TABLET BY MOUTH AT BEDTIME  . triamcinolone cream (KENALOG) 0.1 % APPLY  CREAM EXTERNALLY TWICE DAILY  . [EXPIRED] cyanocobalamin ((VITAMIN B-12)) injection 1,000 mcg    No facility-administered encounter medications on file as of 02/11/2018.     Review of Systems  Constitutional:  Negative for appetite change and unexpected weight change.  HENT: Negative for congestion and sinus pressure.   Respiratory: Negative for cough and chest tightness.        Breathing stable.    Cardiovascular: Negative for chest pain and palpitations.       Stable - lower extremity swelling.   Gastrointestinal: Negative for abdominal pain, diarrhea, nausea and vomiting.  Genitourinary: Negative for difficulty urinating and dysuria.  Musculoskeletal: Negative for joint swelling and myalgias.  Skin: Negative for color change and rash.  Neurological: Negative for dizziness, light-headedness and headaches.  Psychiatric/Behavioral: Negative for agitation and dysphoric mood.       Objective:    Physical Exam  Constitutional: She appears well-developed and well-nourished. No distress.  HENT:  Nose: Nose normal.  Mouth/Throat: Oropharynx is clear and moist.  Neck: Neck supple. No thyromegaly present.  Cardiovascular: Normal rate and regular rhythm.  Pulmonary/Chest: Breath sounds normal. No respiratory distress. She has no wheezes.  Abdominal: Soft. Bowel sounds are normal. There is no tenderness.  Musculoskeletal: She exhibits no edema or tenderness.  Lymphadenopathy:    She has no cervical adenopathy.  Skin: No rash noted. No erythema.  Psychiatric: She has a normal mood and affect. Her behavior is normal.    BP 136/78 (BP Location: Left Arm, Patient Position: Sitting, Cuff Size: Normal)   Pulse 60   Temp 98.1 F (36.7 C) (Oral)   Resp 18   Wt 246 lb (111.6 kg)   SpO2 97%   BMI 45.73 kg/m  Wt Readings from Last 3 Encounters:  02/11/18 246 lb  (111.6 kg)  01/01/18 241 lb (109.3 kg)  12/09/17 242 lb 12.8 oz (110.1 kg)     Lab Results  Component Value Date   WBC 8.5 12/03/2017   HGB 12.7 12/03/2017   HCT 38.8 12/03/2017   PLT 214 12/03/2017   GLUCOSE 168 (H) 02/09/2018   CHOL 109 02/09/2018   TRIG 82.0 02/09/2018   HDL 55.50 02/09/2018   LDLDIRECT 74.4 02/21/2012   LDLCALC 37 02/09/2018   ALT 12 02/09/2018   AST 13 02/09/2018   NA 139 02/09/2018   K 4.6 02/10/2018   CL 104 02/09/2018   CREATININE 0.92 02/09/2018   BUN 23 02/09/2018   CO2 29 02/09/2018   TSH 2.32 10/06/2017   HGBA1C 7.8 (H) 02/09/2018   MICROALBUR 3.8 (H) 02/26/2017       Assessment & Plan:   Problem List Items Addressed This Visit    Aortic atherosclerosis ([place])    On simvastatin.       B12 deficiency - Primary    Continue b12 injections.        Relevant Medications   cyanocobalamin ((VITAMIN B-12)) injection 1,000 mcg (Completed)   Bilateral leg edema    Has seen [facility].  Stable.       BMI 45.0-49.9, adult (HCC)    Discussed diet and exercise.  Follow.        Diabetes mellitus ([place])    Discussed diet and exercise.  Sugars as outlined.  Have her take medications regularly - not skipping.  Discussed diet, exercise and appropriate snacks.  Discussed adjusting long acting insulin to help with pm elevation of sugars.  She will check and record sugars.  F/u with Catie in the next 3-4 weeks.  Bring in sugar readings.        Hyperkalemia    Repeat potassium wnl.       Hyperlipidemia    Low cholesterol diet  and exercise.  On simvastatin.  Follow lipid panel and liver function tests.        Hypertension    Blood pressure under good control.  Continue same medication regimen.  Follow pressures.  Follow metabolic panel.        Stress    Increased stress.  Discussed with her today.  Has good support.  Does not feel she needs anything more at this time.  Follow.         Other Visit Diagnoses    Need for pneumococcal vaccination        Relevant Orders   Pneumococcal polysaccharide vaccine 23-valent greater than or equal to 2yo subcutaneous/IM (Completed)       Einar Pheasant, MD

## 2018-02-14 ENCOUNTER — Encounter: Payer: Self-pay | Admitting: Internal Medicine

## 2018-02-14 NOTE — Assessment & Plan Note (Signed)
Has seen [facility].  Stable.  

## 2018-02-14 NOTE — Assessment & Plan Note (Signed)
Low cholesterol diet and exercise.  On simvastatin.  Follow lipid panel and liver function tests.   

## 2018-02-14 NOTE — Assessment & Plan Note (Signed)
On simvastatin.   

## 2018-02-14 NOTE — Assessment & Plan Note (Signed)
Discussed diet and exercise.  Follow.  

## 2018-02-14 NOTE — Assessment & Plan Note (Signed)
Discussed diet and exercise.  Sugars as outlined.  Have her take medications regularly - not skipping.  Discussed diet, exercise and appropriate snacks.  Discussed adjusting long acting insulin to help with pm elevation of sugars.  She will check and record sugars.  F/u with Catie in the next 3-4 weeks.  Bring in sugar readings.

## 2018-02-14 NOTE — Assessment & Plan Note (Signed)
Continue b12 injections.  

## 2018-02-14 NOTE — Assessment & Plan Note (Signed)
Increased stress.  Discussed with her today.  Has good support.  Does not feel she needs anything more at this time.  Follow.

## 2018-02-14 NOTE — Assessment & Plan Note (Signed)
Blood pressure under good control.  Continue same medication regimen.  Follow pressures.  Follow metabolic panel.   

## 2018-02-14 NOTE — Assessment & Plan Note (Signed)
Repeat potassium wnl.

## 2018-02-19 ENCOUNTER — Encounter: Payer: Self-pay | Admitting: Internal Medicine

## 2018-02-20 ENCOUNTER — Other Ambulatory Visit: Payer: Self-pay | Admitting: Internal Medicine

## 2018-02-20 DIAGNOSIS — Z1231 Encounter for screening mammogram for malignant neoplasm of breast: Secondary | ICD-10-CM

## 2018-02-20 NOTE — Telephone Encounter (Signed)
Pt sees Catie on 11/25. Do you want her to switch to the metformin er or continue what she is taking until her appt?

## 2018-02-22 MED ORDER — METFORMIN HCL ER 500 MG PO TB24: ORAL_TABLET | ORAL | 2 refills | Status: DC

## 2018-02-22 NOTE — Telephone Encounter (Signed)
rx sent in for metformin XR 500mg  #120 with 2 refills.

## 2018-03-02 ENCOUNTER — Encounter: Payer: Self-pay | Admitting: Pharmacist

## 2018-03-02 ENCOUNTER — Ambulatory Visit (INDEPENDENT_AMBULATORY_CARE_PROVIDER_SITE_OTHER): Payer: Medicare HMO | Admitting: Pharmacist

## 2018-03-02 VITALS — BP 121/73 | HR 56 | Wt 247.8 lb

## 2018-03-02 DIAGNOSIS — Z794 Long term (current) use of insulin: Secondary | ICD-10-CM

## 2018-03-02 DIAGNOSIS — I10 Essential (primary) hypertension: Secondary | ICD-10-CM | POA: Diagnosis not present

## 2018-03-02 DIAGNOSIS — E1140 Type 2 diabetes mellitus with diabetic neuropathy, unspecified: Secondary | ICD-10-CM

## 2018-03-02 MED ORDER — LISINOPRIL 20 MG PO TABS: 20.0000 mg | ORAL_TABLET | Freq: Two times a day (BID) | ORAL | 3 refills | Status: DC

## 2018-03-02 MED ORDER — INSULIN LISPRO 100 UNIT/ML ~~LOC~~ SOLN: SUBCUTANEOUS | 0 refills | Status: DC

## 2018-03-02 MED ORDER — PIOGLITAZONE HCL 15 MG PO TABS: 15.0000 mg | ORAL_TABLET | Freq: Every day | ORAL | 2 refills | Status: DC

## 2018-03-02 MED ORDER — CARVEDILOL 25 MG PO TABS: 25.0000 mg | ORAL_TABLET | Freq: Two times a day (BID) | ORAL | 3 refills | Status: DC

## 2018-03-02 NOTE — Assessment & Plan Note (Signed)
#  Hypertension currently controled. Goal BP <140/90; however, episodes of hyperkalemia may be related to supratherapeutic dose of lisinopril; appropriate to reduce, and adjust other medications to compensate and maintain controlled BP - Decrease lisinopril to 20 mg BID, for target dose of 40 mg daily - Discontinue atenolol; start carvedilol 25 mg BID. Recommended patient monitor BP at home at different times of the day to evaluate impact of therapy change - Continue HCTZ 25 mg QAM, amlodipine 10 mg QPM. Patient could not remember if amlodipine had worsened peripheral edema when it was originally started, but will keep this consideration in mind.

## 2018-03-02 NOTE — Patient Instructions (Addendum)
It was great to meet you today!   1) We are going to start a mealtime insulin - [name] (insulin lispro) 10 units with supper.  I am going to start the application process for [name] patient assistance. Please drop off a copy of something that shows proof of your income (2018 tax return or your statement from social security).  2) Continue Levemir 24 units in the morning, 12 units in the evening. Let us know when you have ~2 weeks left of Levemir, and we will send in a new prescription for Tresiba.  2) Decrease pioglitazone to 15 mg once daily. You can split the 30 mg tablet in half. I have sent an updated prescription to the pharmacy.  3) Decrease lisinopril to 20 mg twice daily (instead of 40 mg in the evening).  4) Stop atenolol. Start carvedilol 25 mg twice daily.   Continue to check your blood sugar 1) fasting in the morning and 2) about 2 hours after supper. This after supper number will help me adjust the [name] dose. Our target 2 hour post-supper reading is less than 180.   Continue to check your blood pressure at home, at different times of the day. Please bring these readings to clinic with you next time.   Schedule follow up with me on December 30th.   Either my pharmacy technician Noreene LarssonJill or I will be in touch regarding the patient assistance for [name].

## 2018-03-02 NOTE — Assessment & Plan Note (Signed)
#  Diabetes - Currently uncontrolled, most recent A1c 7.8% on 02/09/2018, worsened from 7.5% on 10/06/2017; Goal A1c <7%. 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 Pioglitazone may be worsening peripheral edema, appropriate to titrate down and potentially eventually discontinue. Patient is taking glipizide in the morning but not always eating; may eventually be appropriate to discontinue this medication.  - Start [name] (insulin lispro) 8 units with supper. Counseled patient to incorporate 2 hour post-prandial checks to evaluate and titrate insulin  - Started application process for [name] through [employer] with hardship letter. Patient will bring copy of proof of income by clinic; once all parts received, I will pass application along to [facility], CPhT for processing, submission, and follow up. - Continue Levemir 24 units QAM and 12 units QPM, as patient would like to complete this supply first. Once finished, consider changing to [place], as covered by insurance. If approved for [name] patient assistance, would consider changing to Basaglar (also a [name] product) - Decrease pioglitazone to 15 mg daily. If edema improves with dose reduction, could consider discontinuation - Continue glipizide XL 5 mg daily. Continue to monitor for daytime hypoglycemia.  - Continue metformin XR 1000 mg BID.

## 2018-03-02 NOTE — Progress Notes (Addendum)
S:     Chief Complaint  Patient presents with  . Medication Management    Diabetes    Patient arrives in good spirits, ambulating without assistance.  Presents for diabetes evaluation, education, and management at the request of Dr. Lorin PicketScott at last appointment on  (referred on 02/11/2018) - at that time, she was changed from IR to XR metformin (see my chart message 02/19/2018).   Today, she notes that she has not started the extended release metformin yet, but plans to pick it up from the pharmacy today. She is hoping that the XR product may be better tolerated from a GI perspective. She struggles with IBS-D, and notes that she cannot eat any meals prior to leaving the house, as she will have accidents. She has been on this current regimen for diabetes for many years. She does believe that the pioglitazone did worsen her peripheral edema, though she has concurrent lymphedema. She notes that she has ~ 1 month of Levemir at home, and would like to finish this before transitioning to a new basal insulin.   She notes that she checks BP on a regular basis and it is generally well controlled <140/90, though when she is particularly stressed, she will see diastolic blood pressures in the 90-100s. Per chart review, she recently had a potassium level of 5.5 that resolved without treatment.   Patient reports Diabetes was diagnosed at age 72  Insurance coverage/medication affordability: [employer] Medicare  - $50 copay for Levemir; She notes that she does have a difficult time with finances; she is working with gastro to find pancreatic enzyme replacements that work for her IBS-D, but did not meet financial cutoffs to receive patient assistance for these products.   Patient reports adherence with medications.  Current diabetes medications include: metformin IR 1000 mg BID (will be changing to XR), glipizide XL 5 mg daily, pioglitazone 30 mg daily, Levemir 24 units QAM, 12 units QPM Current hypertension  medications include: lisinopril 20 mg QAM, 40 mg QPM, atenolol 100 mg QAM, 50 mg QPM, amlodipine 10 mg QHS, HCTZ 25 mg QAM  Patient reports occasional hypoglycemic s/sx including dizziness around 4-5 am, or when she wakes up in the mornings. Patient denies hyperglycemic symptoms including polyuria, polydipsia, polyphagia, nocturia, blurred vision, but reports significant peripheral neuropathy. She has declined gabapentin in the past, and does not have any interest in taking any analgesics. Patient reports self foot exams.   Patient reported dietary habits: Variable meal schedule based on going out (often has accidents); supper is the only meal she consistently eats Breakfast: Eggs, occasionally bacon or sausage (infrequently); coffee  Lunch: Very sporadic as she didn't eat lunch when she was working; occasionally eats a sandwich around 2-3 pm (chicken salad, pimiento cheese, etc).  Dinner: Consistently eats this meal; spaghetti last night;    O:  Physical Exam  Constitutional: She appears well-developed and well-nourished.  Vitals reviewed.  Review of Systems  Cardiovascular: Positive for leg swelling (1+ pitting edema bilaterally).    Lab Results  Component Value Date   HGBA1C 7.8 (H) 02/09/2018   Vitals:   03/02/18 1031  BP: 121/73  Pulse: (!) 56    Basic Metabolic Panel BMP Latest Ref Rng & Units 02/10/2018 02/09/2018 12/03/2017  Glucose 70 - 99 mg/dL - 161(W168(H) 960(A177(H)  BUN 6 - 23 mg/dL - 23 21  Creatinine 5.400.40 - 1.20 mg/dL - 9.810.92 1.910.87  Sodium 478135 - 145 mEq/L - 139 136  Potassium 3.5 - 5.1  mmol/L 4.6 5.5(H) 4.4  Chloride 96 - 112 mEq/L - 104 101  CO2 19 - 32 mEq/L - 29 24  Calcium 8.4 - 10.5 mg/dL - 9.6 9.1     Lipid Panel     Component Value Date/Time   CHOL 109 02/09/2018 0942   TRIG 82.0 02/09/2018 0942   HDL 55.50 02/09/2018 0942   CHOLHDL 2 02/09/2018 0942   VLDL 16.4 02/09/2018 0942   LDLCALC 37 02/09/2018 0942   LDLDIRECT 74.4 02/21/2012 1006    Fasting SMBG:  120 typically, down to 95, occasionally wakes up in the mornings, but this is very infrequently. Pre lunch: 115-120s, occasional 140s Bedtime: Typically in 200s  Clinical ASCVD: Yes  (dx aortic atherosclerosis)  High-risk conditions: Age 14 or older  A/P: Following discussion and approval by Dr. Lorin Picket, the following medication changes were made:   #Diabetes - Currently uncontrolled, most recent A1c 7.8% on 02/09/2018, worsened from 7.5% on 10/06/2017; Goal A1c <7%. 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 Pioglitazone may be worsening peripheral edema, appropriate to titrate down and potentially eventually discontinue. Patient is taking glipizide in the morning but not always eating; may eventually be appropriate to discontinue this medication.  - Start [name] (insulin lispro) 8 units with supper. Counseled patient to incorporate 2 hour post-prandial checks to evaluate and titrate insulin  - Started application process for [name] through [employer] with hardship letter. Patient will bring copy of proof of income by clinic; once all parts received, I will pass application along to [facility], CPhT for processing, submission, and follow up. - Continue Levemir 24 units QAM and 12 units QPM, as patient would like to complete this supply first. Once finished, consider changing to [place], as covered by insurance. If approved for [name] patient assistance, would consider changing to Basaglar (also a [name] product) - Decrease pioglitazone to 15 mg daily. If edema improves with dose reduction, could consider discontinuation - Continue glipizide XL 5 mg daily. Continue to monitor for daytime hypoglycemia.  - Continue metformin XR 1000 mg BID.  #Hypertension currently controled. Goal BP <140/90; however, episodes of hyperkalemia may be related to supratherapeutic dose of lisinopril; appropriate to reduce, and adjust other medications to compensate and maintain controlled BP - Decrease  lisinopril to 20 mg BID, for target dose of 40 mg daily - Discontinue atenolol; start carvedilol 25 mg BID. Recommended patient monitor BP at home at different times of the day to evaluate impact of therapy change - Continue HCTZ 25 mg QAM, amlodipine 10 mg QPM. Patient could not remember if amlodipine had worsened peripheral edema when it was originally started, but will keep this consideration in mind.  #ASCVD risk - hx atherosclerosis; LDL well controlled at goal <70 on simvastatin therapy - Continue simvastatin 10 mg daily  Written patient instructions provided.  Total time in face to face counseling 60 minutes.    Follow up in Pharmacist Clinic Visit 4 weeks.  [name], PharmD PGY2 Ambulatory Care Pharmacy Resident Phone: [phone]  Reviewed above information.  Agree with assessment and plan.   Dr Lorin Picket

## 2018-03-09 ENCOUNTER — Encounter: Payer: Self-pay | Admitting: Internal Medicine

## 2018-03-09 ENCOUNTER — Other Ambulatory Visit: Payer: Self-pay | Admitting: Internal Medicine

## 2018-03-09 NOTE — Telephone Encounter (Signed)
Pt had reaction from carvedilol. Stopped and sx resolved. Would you like for pt to continue to stay off of carvedilol and is there something she should replace it with

## 2018-03-09 NOTE — Telephone Encounter (Signed)
Please confirm no other symptoms and doing ok now.  Not sure if was from carvedilol.  Can remain off and go back on her atenolol as she was taking previously.  Will need to follow blood pressures and let us know if remains elevated.

## 2018-03-09 NOTE — Telephone Encounter (Signed)
Patient confirmed no other sx. Started her atenolol back. Sx have resolved completely. Pt agreed to monitor pressures and let me know. Pt also said she would be willing to try the carvedilol again.

## 2018-03-12 ENCOUNTER — Other Ambulatory Visit: Payer: Self-pay | Admitting: Pharmacy Technician

## 2018-03-12 NOTE — Patient Outreach (Signed)
[facility] Care Management  03/12/2018  Beth Tyler 09-18-1945 [id number]   Successful outreach call placed to patient in regards to patient assistance application for [name] through [employer].  Spoke to [name], HIPAA identifiers verified. Patient informed me that she has not received her application in the mail yet. Informed patient that our mail has to go to the hospital first before it gets send to the postal service as the hospital pays for our postage. Also there may be a delay due to the Holiday last week. Informed patient that I would followup next week.  Plan to followup with patient in 2-3 business days to confirm receipt of application.  Druscilla Petsch P. Calloway Andrus, CPhT [facility] Care Management [phone]

## 2018-03-16 ENCOUNTER — Other Ambulatory Visit: Payer: Self-pay | Admitting: Pharmacy Technician

## 2018-03-16 NOTE — Patient Outreach (Signed)
[facility] Care Management  03/16/2018  Beth Tyler 07/20/1945 [id number]    Sucessful outreach call placed to patient in regards to her medication assistance application for [name] with [name]. HIPPA identifiers verified.  Spoke to [name] who reminded me that she filled out the application at her last office visit in November [facility] Catie Feliz Beamravis. She said we were waiting on her SS statement which she received this weekend. She informed me that she was going to [facility] it off [facility]  [place] [facility] in the morning b/c she has an appt.  Will followup with dr office in 1-2 business days if fax has not been received.  Kendre Sires P. Izaih Kataoka, CPhT [facility] Care Management [phone]

## 2018-03-17 ENCOUNTER — Ambulatory Visit (INDEPENDENT_AMBULATORY_CARE_PROVIDER_SITE_OTHER): Payer: Medicare HMO | Admitting: *Deleted

## 2018-03-17 DIAGNOSIS — E538 Deficiency of other specified B group vitamins: Secondary | ICD-10-CM | POA: Diagnosis not present

## 2018-03-17 MED ORDER — CYANOCOBALAMIN 1000 MCG/ML IJ SOLN
1000.0000 ug | Freq: Once | INTRAMUSCULAR | Status: AC
  Administered 2018-03-17: 1000 ug via INTRAMUSCULAR

## 2018-03-17 NOTE — Progress Notes (Signed)
Patient presented for B 12 injection to right deltoid, patient voiced no concerns nor showed any signs of distress during injection. 

## 2018-03-18 ENCOUNTER — Other Ambulatory Visit: Payer: Self-pay | Admitting: Pharmacy Technician

## 2018-03-18 NOTE — Patient Outreach (Signed)
[facility]) Care Management  03/18/2018  Beth Tyler Jul 06, 1945 [id number]   Sucessful outreach call placed to patient in regards to patient assistance for [name] through [employer].  Beth Tyler answered the phone, HIPAA identifiers [email or web address]. Beth Tyler said she tried to leave her Social security statement at the office but the 2 front office attendants said the policy was not to fax social security information. Patient agreed to place in mail. Provided patient the [facility].  Will followup in 3-5 business days if documents have not been received.  Beth Tyler P. Sham Alviar, CPhT [facility] Care Management [phone]

## 2018-03-25 ENCOUNTER — Other Ambulatory Visit: Payer: Self-pay | Admitting: Pharmacy Technician

## 2018-03-25 NOTE — Patient Outreach (Signed)
[facility]) Care Management  03/25/2018  Beth BostonMickie [name] 24-Aug-1945 [id number]   Unsuccessful outreach call placed to patient in regards to [name] medication assistance application for [name].  Was calling to inquire if the patient had mailed in her social security statement as indicated in prior documentation. Unfortunately patient did not answer the phone.HIPPA complaint voicemail left.  Will followup in 3-5 business days with 2nd attempt if call is not returned.  Ines Warf P. Jhony Antrim, CPhT MusicianTri[facility] Network Care Management [phone]

## 2018-03-28 ENCOUNTER — Other Ambulatory Visit: Payer: Self-pay | Admitting: Internal Medicine

## 2018-03-30 ENCOUNTER — Other Ambulatory Visit: Payer: Self-pay | Admitting: Pharmacy Technician

## 2018-03-30 ENCOUNTER — Other Ambulatory Visit: Payer: Self-pay

## 2018-03-30 MED ORDER — TRIAMCINOLONE ACETONIDE 0.1 % EX CREA: TOPICAL_CREAM | CUTANEOUS | 0 refills | Status: DC

## 2018-03-30 NOTE — Patient Outreach (Signed)
[facility]) Care Management  03/30/2018  Beth Tyler [birth year] [id number]   Second unsuccessful call attempt placed to patient in regards to her [name] patient assistance application for [name].  Unfortunately patient did not answer the phone to inquire if she had mailed in her social security proof of income as he indicated she would in a previous phone call.  Will route note to [facility] Catie Feliz Beamravis to followup with patient at her appointment on 12/30.  Kimaria Struthers P. Shamarie Call, CPhT [facility]ician[facility] Network Care Management [phone]

## 2018-04-03 ENCOUNTER — Other Ambulatory Visit: Payer: Self-pay | Admitting: Pharmacy Technician

## 2018-04-03 NOTE — Patient Outreach (Signed)
[facility]) Care Management  04/03/2018  Beth Tyler 07-Dec-1945 [id number]  Received all necessary documents and signatures from both patient and provider for [name] for [name].  Submitted completed application via fax to [employer].  Will followup with company in 10-14 business days to inquire on status of application.  Carl Butner P. Deyonte Cadden, CPhT [facility]ician[facility] Care Management [phone]

## 2018-04-06 ENCOUNTER — Encounter: Payer: Self-pay | Admitting: Pharmacist

## 2018-04-06 ENCOUNTER — Ambulatory Visit (INDEPENDENT_AMBULATORY_CARE_PROVIDER_SITE_OTHER): Payer: Medicare HMO | Admitting: Pharmacist

## 2018-04-06 ENCOUNTER — Ambulatory Visit
Admission: RE | Admit: 2018-04-06 | Discharge: 2018-04-06 | Disposition: A | Discharge status: Home / Self Care | Payer: Medicare HMO | Source: Ambulatory Visit | Attending: Internal Medicine | Admitting: Internal Medicine

## 2018-04-06 DIAGNOSIS — Z1231 Encounter for screening mammogram for malignant neoplasm of breast: Secondary | ICD-10-CM | POA: Diagnosis not present

## 2018-04-06 DIAGNOSIS — E1140 Type 2 diabetes mellitus with diabetic neuropathy, unspecified: Secondary | ICD-10-CM

## 2018-04-06 DIAGNOSIS — Z794 Long term (current) use of insulin: Secondary | ICD-10-CM | POA: Diagnosis not present

## 2018-04-06 MED ORDER — INSULIN LISPRO 100 UNIT/ML ~~LOC~~ SOLN: SUBCUTANEOUS | 0 refills | Status: DC

## 2018-04-06 NOTE — Patient Instructions (Addendum)
It was great to see you today!  We are going to make 2 changes:  1) Take [name] 8 units with whichever meal is your biggest of the day, whether this is lunch or supper. Record your blood sugars ~2 hours after the meal that you used [name] with. Our goal is less than 180.   2) Decrease lisinopril to take 20 mg in the morning and 20 mg in the evening. The higher dose may be contributing to elevated potassium levels. To make up for the decreased lisinopril dose, we are going to use carvedilol instead of atenolol, as carvedilol typically is a bit more potent. Stop the atenolol and start carvedilol 25 mg twice daily.    My pharmacy technician, Noreene LarssonJill, or I will be in touch regarding the patient assistance application to [name] for [name]. If you are approved for that program, we will likely change Levemir to a longer acting insulin. For now, continue the current Levemir, metformin, glipizide, and pioglitazone doses.   Please call clinic if you have any questions or concerns.   Schedule follow up with me in 4 weeks.   Catie Feliz Beamravis, PharmD     Date Before Breakfast  Before Lunch After Lunch [name]? Before Supper After Supper [name]?

## 2018-04-06 NOTE — Assessment & Plan Note (Signed)
#  Diabetes - Currently uncontrolled, most recent A1c 7.8% on 02/09/2018, worsened from 7.5% on 10/06/2017; Goal A1c <7%. 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 As patient sometimes has a substantial lunch meal, appropriate to adjust [name] administration - Take [name] 8 units daily with whichever meal is bigger, lunch or supper. Continue to check 2 hours post prandial SMBG, but notate whether this was post-[name] or not for future [name] adjustments - Continue pioglitazone 15 mg daily, metformin XR 1000 mg BID, Levemir 24 units QAM, 12 units QPM, glipizide XL 5 mg daily. - Will likely have a determination on whether patient was approved for [name] patient assistance in the next 10 business days. If she is approved, consider changing Levemir to Basaglar (also a [name] product) for more consistent insulin release and longer coverage.

## 2018-04-06 NOTE — Progress Notes (Addendum)
S:     Chief Complaint  Patient presents with  . Medication Management    Diabetes    Patient arrives in good spirits, ambulating without assistance.  Presents for diabetes evaluation, education, and management at the request of Dr. Lorin PicketScott at last appointment (referred on 02/11/2018) - at that time, she was changed from IR to XR metformin (see my chart message 02/19/2018). Last seen in Pharmacy Clinic on 03/02/2018 - at that time, [name] was started, pioglitazone dose was reduced d/t edema. [name] Patient Assistance application process was submitted for [name] in the interim. D/t hyperkalemia, lisinopril dose was reduced, atenolol was discontinued and carvedilol started; however, patient experienced "severe muscle pain in right arm" that resolved upon carvedilol discontinuation. Atenolol was restarted.   Today, she notes that she did not realize she was supposed to decrease lisinopril, so she has continued to take 60 mg daily. She also notes that her "muscle pain" has not resolved since stopping carvedilol, so she does not believe it was related.   She denies any concerns with [name]. She has been checking SMBG before and after meals, but is unsure which days she used [name] with supper and which ones she didn't. Occasionally, lunch is her larger meal, so she will eat a smaller supper and not use [name]. For example, she had a larger lunch yesterday, so just ate turnip greens for supper. She did not use [name]. She denies any decrease in edema with dose reduction of pioglitazone.  Patient reports Diabetes was diagnosed at age 72  Insurance coverage/medication affordability: [employer]  - She notes that she does have a difficult time with finances; she anticipates things will get better in ~3 months as she only has 3 more car payments  Patient reports adherence with medications.  Current diabetes medications include: metformin ER 1000 mg BID, glipizide XL 5 mg daily, pioglitazone 15  mg daily, Levemir 24 units QAM, 12 units QPM; [name] 8 units with supper  Current hypertension medications include: lisinopril 20 mg QAM, 40 mg QPM (was supposed to decrease to 20 mg), atenolol 50 mg QAM, 100 mg QPM, amlodipine 10 mg QHS, HCTZ 25 mg QAM     O:  Physical Exam Vitals signs reviewed.  Constitutional:      Appearance: She is well-developed.    Review of Systems  Cardiovascular: Positive for leg swelling (1+ pitting edema bilaterally).    Lab Results  Component Value Date   HGBA1C 7.8 (H) 02/09/2018   Vitals:   04/06/18 1129  BP: 139/78  Pulse: (!) 56    Basic Metabolic Panel BMP Latest Ref Rng & Units 02/10/2018 02/09/2018 12/03/2017  Glucose 70 - 99 mg/dL - 621(H168(H) 086(V177(H)  BUN 6 - 23 mg/dL - 23 21  Creatinine 7.840.40 - 1.20 mg/dL - 6.960.92 2.950.87  Sodium 284135 - 145 mEq/L - 139 136  Potassium 3.5 - 5.1 mmol/L 4.6 5.5(H) 4.4  Chloride 96 - 112 mEq/L - 104 101  CO2 19 - 32 mEq/L - 29 24  Calcium 8.4 - 10.5 mg/dL - 9.6 9.1     Lipid Panel     Component Value Date/Time   CHOL 109 02/09/2018 0942   TRIG 82.0 02/09/2018 0942   HDL 55.50 02/09/2018 0942   CHOLHDL 2 02/09/2018 0942   VLDL 16.4 02/09/2018 0942   LDLCALC 37 02/09/2018 0942   LDLDIRECT 74.4 02/21/2012 1006    Fasting SMBG: 70-120 (one episode to 51) Pre lunch: 110-130s After lunch: 170-220s Pre supper:  140-160s Bedtime: 160s-190s  Bps: 120s/70s at home  Clinical ASCVD: Yes  (dx aortic atherosclerosis)  High-risk conditions: Age 73 or older  A/P: Following discussion and approval by Dr. Lorin PicketScott, the following medication changes were made:   #Diabetes - Currently uncontrolled, most recent A1c 7.8% on 02/09/2018, worsened from 7.5% on 10/06/2017; Goal A1c <7%. 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 As patient sometimes has a substantial lunch meal, appropriate to adjust [name] administration - Take [name] 8 units daily with whichever meal is bigger, lunch or supper. Continue to  check 2 hours post prandial SMBG, but notate whether this was post-[name] or not for future [name] adjustments - Continue pioglitazone 15 mg daily, metformin XR 1000 mg BID, Levemir 24 units QAM, 12 units QPM, glipizide XL 5 mg daily. - Will likely have a determination on whether patient was approved for [name] patient assistance in the next 10 business days. If she is approved, consider changing Levemir to Basaglar (also a [name] product) for more consistent insulin release and longer coverage.  #Hypertension currently controlled. Goal BP <140/90; however, episodes of hyperkalemia may be related to supratherapeutic dose of lisinopril; As intended at last visit, appropriate to reduce, and adjust other medications to compensate and maintain controlled BP - Decrease lisinopril to 20 mg BID, for target dose of 40 mg daily - Discontinue atenolol; start carvedilol 25 mg BID. Recommended patient monitor BP at home at different times of the day to evaluate impact of therapy change - Continue HCTZ 25 mg QAM, amlodipine 10 mg QPM. Patient could not remember if amlodipine had worsened peripheral edema when it was originally started, but will keep this consideration in mind.  #ASCVD risk - hx atherosclerosis; LDL well controlled at goal <70 on simvastatin therapy - Continue simvastatin 10 mg daily  Written patient instructions provided.  Total time in face to face counseling 60 minutes.    Follow up in Pharmacist Clinic Visit 4 weeks.  Lourena Simmondsatherine E Kailyn Dubie, PharmD PGY2 Ambulatory Care Pharmacy Resident Phone: [phone]  Reviewed above information.  Agree with assessment and plan.    Dr Lorin PicketScott

## 2018-04-09 ENCOUNTER — Telehealth: Payer: Self-pay

## 2018-04-09 ENCOUNTER — Ambulatory Visit: Payer: Self-pay | Admitting: *Deleted

## 2018-04-09 NOTE — Telephone Encounter (Signed)
Late entry.  Called pt 04/09/18 (1730).  States she is feeling well.  Just started taking coreg.  Blood pressure earlier 140s/80-90.  She started checking it frequently this pm and worried about blood pressure.  Due to take coreg, amlodipine and lisinopril this pm.  Has not taken yet.  States feeling fine.  Declines need for evaluation this pm.  Instructed to take her three scheduled medications this pm.  Rest.  Recheck pressure this pm.  Also recheck in am.  Follow for bp trends to see if need to adjust medication further.  Any change or worsening symptoms, she is to be evaluated.

## 2018-04-09 NOTE — Telephone Encounter (Signed)
Since patient's BP meds were changed on 04/06/18 she has been asymptomatic & BP's trending up. Her readings have been consistently high. She said heart rate was also elevated. Her readings from today are:  9am 145/80 4pm 141/90 4:30pm 163/116 5pm 181/101

## 2018-04-09 NOTE — Telephone Encounter (Signed)
See other note same day.  Pt called and discussed medications.

## 2018-04-09 NOTE — Telephone Encounter (Signed)
Pt calling to report that her BP has been elevated since BP medications were changed by pharmacist during visit on Monday. Pt reports that she does not have any symptoms at this time. Called FC at office to see if he pt would be able to be worked in for an appt with PCP on tomorrow or if medications could be adjusted. Conference call initiated with Maralyn Sago in the office so that pt could report the BP readings.Pt reports that her BP at 9 am today was 145/80, at 4pm 141/90, prior to calling the office BP was 163/116 and BP while on the phone with agent BP was 181/101.  Reason for Disposition . Systolic BP  >= 180 OR Diastolic >= 110  Answer Assessment - Initial Assessment Questions 1. BLOOD PRESSURE: "What is the blood pressure?" "Did you take at least two measurements 5 minutes apart?"     181/101 and HR is 74, but usually runs 54-60 2. ONSET: "When did you take your blood pressure?"     While on the phone with agent 3. HOW: "How did you obtain the blood pressure?" (e.g., visiting nurse, automatic home BP monitor)     Automatic home monitor that she uses on her wrist 4. HISTORY: "Do you have a history of high blood pressure?"     Yes, and was recently taken off 5. MEDICATIONS: "Are you taking any medications for blood pressure?" "Have you missed any doses recently?"     Carvedilol- one in morning and one at night, Lisinopril-one in the morning and one at night, and amlodipine at night 6. OTHER SYMPTOMS: "Do you have any symptoms?" (e.g., headache, chest pain, blurred vision, difficulty breathing, weakness)     No  Protocols used: HIGH BLOOD PRESSURE-A-AH

## 2018-04-16 ENCOUNTER — Telehealth: Payer: Self-pay

## 2018-04-16 NOTE — Telephone Encounter (Signed)
I called patient & there was no VM set up to leave message. I called to inform patient that [employer] sent [name] for her to pick up at our office.

## 2018-04-20 ENCOUNTER — Other Ambulatory Visit: Payer: Self-pay | Admitting: Pharmacy Technician

## 2018-04-20 NOTE — Patient Outreach (Signed)
[facility]) Care Management  04/20/2018  Beth BostonMickie [name] Apr 10, 1945 [id number]   Care coordination call placed to [facility] in regards to patient's medication assistance for [name].  Spoke to HugoAmanda who said the application was in special exceptions. Marchelle Folksmanda says she will send the application over to processing and to check back  in approximately 1 week.  Will followup with [name] in 5-7 business days to see if a determination has been made.  Romar Woodrick P. Charlena Haub, CPhT Musician[facility] Care Management [phone]

## 2018-04-20 NOTE — Patient Outreach (Signed)
[facility]) Care Management  04/20/2018  LYSSETTE KIEU [birth year] [id number]  Received fax at [employer] that patient was approved for [name] patient assistance 04/14/2018 - 04/08/2019.    Catie Feliz Beam, PharmD PGY2 Ambulatory Care Pharmacy Resident, [facility] Phone: [phone]

## 2018-04-21 ENCOUNTER — Ambulatory Visit: Payer: Medicare HMO

## 2018-04-22 ENCOUNTER — Other Ambulatory Visit: Payer: Self-pay | Admitting: Pharmacy Technician

## 2018-04-22 NOTE — Patient Outreach (Signed)
[facility] ([facility]) Care Management  04/22/2018  Beth BostonMickie [name] June 30, 1945 [id number]   Care coordination call placed to [facility] in regards to patient's medication assistance application for [name].  Spoke to KirvinKatelyn who said patient had been APPROVED from 04/14/2018 thru 04/08/2019. Katelyn informed me that 1 box of [name] [name] was shipped out on 04/15/2018 and confirmed delivered on 04/16/2018 at the providers office at 2:38pm.  Will followup with patient today to inquire if she has picked the medication up from the provider's office.  [name], CPhT [facility]ician[facility] Care Management [phone]

## 2018-04-22 NOTE — Patient Outreach (Signed)
[facility]) Care Management  04/22/2018  TANSY BROWNSON 1945/04/18 [id number]   ADDENDUM  Successful outreach call placed to patient in regards to her [name] patient assistance application for [name].  Spoke to patient, HIPAA identifiers verified. Informed patient that she had been approved for patient assistance with [name] for [name] and that the medication had already been delivered and was waiting on her to pick up at the provider's office. Patient informed me that she had an appointment tomorrow for a B12 shot and would pick the insulin up at that time. Discussed with patient how to obtain refills and that the refills had to go thru the provider's office. Informed her to call office approximately 2 weeks before running out to have them contact [employer]. Also informed her that [name] usually faxes the provider when patients are in need of refills. Patient verbalized understanding and confirmed she had no other concerns or questions at this time. Patient also confirmed that she had our name and numbers if questions were to arise in the future.  Will remove myself from care team as patient assistance has been completed.  Diannie Willner P. Sylvestre Rathgeber, CPhT Musician Care Management [phone]

## 2018-04-23 ENCOUNTER — Ambulatory Visit (INDEPENDENT_AMBULATORY_CARE_PROVIDER_SITE_OTHER): Payer: Medicare HMO | Admitting: Lab

## 2018-04-23 DIAGNOSIS — E538 Deficiency of other specified B group vitamins: Secondary | ICD-10-CM | POA: Diagnosis not present

## 2018-04-23 MED ORDER — CYANOCOBALAMIN 1000 MCG/ML IJ SOLN
1000.0000 ug | Freq: Once | INTRAMUSCULAR | Status: AC
  Administered 2018-04-23: 1000 ug via INTRAMUSCULAR

## 2018-04-23 NOTE — Progress Notes (Signed)
Pt was here today for B12 injection in Left Deltoid, Pt tolerated well.

## 2018-05-04 ENCOUNTER — Encounter: Payer: Self-pay | Admitting: Pharmacist

## 2018-05-04 ENCOUNTER — Ambulatory Visit (INDEPENDENT_AMBULATORY_CARE_PROVIDER_SITE_OTHER): Payer: Medicare HMO | Admitting: Pharmacist

## 2018-05-04 DIAGNOSIS — I10 Essential (primary) hypertension: Secondary | ICD-10-CM | POA: Diagnosis not present

## 2018-05-04 DIAGNOSIS — E1140 Type 2 diabetes mellitus with diabetic neuropathy, unspecified: Secondary | ICD-10-CM | POA: Diagnosis not present

## 2018-05-04 DIAGNOSIS — Z794 Long term (current) use of insulin: Secondary | ICD-10-CM | POA: Diagnosis not present

## 2018-05-04 MED ORDER — HYDROCHLOROTHIAZIDE 25 MG PO TABS: 25.0000 mg | ORAL_TABLET | Freq: Every day | ORAL | 0 refills | Status: DC

## 2018-05-04 MED ORDER — INSULIN LISPRO 100 UNIT/ML ~~LOC~~ SOLN: SUBCUTANEOUS | 0 refills | Status: DC

## 2018-05-04 MED ORDER — CARVEDILOL 12.5 MG PO TABS: 12.5000 mg | ORAL_TABLET | Freq: Two times a day (BID) | ORAL | 0 refills | Status: DC

## 2018-05-04 NOTE — Assessment & Plan Note (Signed)
#  Diabetes - Currently uncontrolled though improved with addition of [name] per SMBG results, most recent A1c 7.8% on 02/09/2018, worsened from 7.5% on 10/06/2017; Goal A1c <7%. 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 Per post-prandial readings, patient requires more prandial coverage with lunch, which will likely improve her pre-supper readings. It does not appear that she is deriving much post-prandial benefit from glipizide therapy, appropriate to discontinue to reduce risk of hypoglycemia - Start taking [name] 4 units with lunch, and decrease supper dose to 6 units. Continue to monitor fasting and post-prandial BG to evaluate [name] dose. - Discontinue glipizide.  - Continue pioglitazone 15 mg daily, metformin XR 1000 mg BID, Levemir 24 units QAM, 12 units QPM. At next visit, will evaluate changing Levemir to Basaglar, as patient can receive Basaglar for free through [employer] Patient Assistance, and this would be a once daily basal versus twice daily.

## 2018-05-04 NOTE — Assessment & Plan Note (Signed)
#  Hypertension currently controlled. Goal BP <140/90, though a more stringent goal may be appropriate given patient tolerability. Patient reports that she has had her home BP cuff for many years, therefore it may not be accurate, as BP was very well controlled in clinic. D/t bradycardia, will reduce carvedilol dose. If further BP control is needed moving forward, can consider changing HCTZ to chlorthalidone for improved potency.  - Decrease carvedilol to 12.5 mg BID.  - Continue HCTZ 25 mg QAM, lisinopril 20 mg BID, and amlodipine 10 mg QPM. Patient could not remember if amlodipine had worsened peripheral edema when it was originally started, but will keep this consideration in mind. - Will discuss future work up for reasons for resistant HTN with PCP Dr. Lorin Picket

## 2018-05-04 NOTE — Patient Instructions (Addendum)
It was great to see you today! Great job with the weight loss!!!  We are going to make a few changes today:  1) Start taking 4 units of [name] with lunch and 6 units with supper.  2) Discontinue glipizide 3) Decrease carvedilol to 12.5 mg twice daily (1/2 of the tablets you currently have) 4) Discontinue hydrochlorothiazide and start chlorthalidone 25 mg once daily.    Keep checking your blood sugars fasting, and either 2 hours after lunch or supper. Goal blood sugars 2 hours after a meal is less than 180  Keep checking blood pressures daily, at different times throughout the day. Bring your blood pressure meter to clinic the next time you see me.    Schedule follow up with me in 4 weeks. At that time, we will see how much Levemir you have at home and we can see about switching to Basaglar.    Date Before Breakfast  Before Lunch After Lunch [name]? Before Supper After Supper [name]?

## 2018-05-04 NOTE — Progress Notes (Addendum)
S:     Chief Complaint  Patient presents with  . Medication Management    Diabetes    Patient arrives in good spirits, ambulating without assistance.  Presents for diabetes evaluation, education, and management at the request of Dr. Lorin Picket at last appointment (referred on 02/11/2018) - at that time, she was changed from IR to XR metformin (see my chart message 02/19/2018). Last seen in Pharmacy Clinic on 04/06/2018- at that time, we decreased lisinopril from 60 mg daily to 40 mg daily to prevent hyperkalemia, and changed atenolol to carvedilol to provide better BP lowering.   In the interim, she was approved for [name] patient assistance through [employer]. Additionally, she called into clinic to report elevated BP readings that increased form 140s/90s to the 180s as the day progressed. She denied any chest pain/pressure, radiating pain, or headaches. Since then, she notes that home readings have remained 140s/80s with a HR in the 60s. She has been checking pre and post prandial readings before [name] with supper.Patinet reports she has ~2 month supply of Levemir at home.  Patient reports Diabetes was diagnosed at age 73  Insurance coverage/medication affordability: Paramedic  - She notes that she does have a difficult time with finances; she anticipates things will get better in ~March when she pays off her car  Patient reports adherence with medications.  Current diabetes medications include: metformin ER 1000 mg BID, glipizide XL 5 mg daily, pioglitazone 15 mg daily, Levemir 24 units QAM, 12 units QPM; [name] 8 units with supper  Current hypertension medications include: lisinopril 20 mg BID, carvedilol 25 mg BID, amlodipine 10 mg QHS, HCTZ 25 mg QAM     O:  Physical Exam Vitals signs reviewed.  Constitutional:      Appearance: Normal appearance. She is well-developed.    Review of Systems  All other systems reviewed and are negative.   Lab Results  Component Value Date   HGBA1C 7.8 (H) 02/09/2018   Vitals:   05/04/18 1122  BP: 122/74  Pulse: (!) 56    Basic Metabolic Panel BMP Latest Ref Rng & Units 02/10/2018 02/09/2018 12/03/2017  Glucose 70 - 99 mg/dL - 810(F) 751(W)  BUN 6 - 23 mg/dL - 23 21  Creatinine 2.58 - 1.20 mg/dL - 5.27 7.82  Sodium 423 - 145 mEq/L - 139 136  Potassium 3.5 - 5.1 mmol/L 4.6 5.5(H) 4.4  Chloride 96 - 112 mEq/L - 104 101  CO2 19 - 32 mEq/L - 29 24  Calcium 8.4 - 10.5 mg/dL - 9.6 9.1     Lipid Panel     Component Value Date/Time   CHOL 109 02/09/2018 0942   TRIG 82.0 02/09/2018 0942   HDL 55.50 02/09/2018 0942   CHOLHDL 2 02/09/2018 0942   VLDL 16.4 02/09/2018 0942   LDLCALC 37 02/09/2018 0942   LDLDIRECT 74.4 02/21/2012 1006   Date Before Breakfast  Before Lunch After Lunch [name]? Before Supper After Supper [name]?  1/4 89 165 222  199 162 8 units  1/5 195 136 188  296 135 8  1/6 117 155 190  226 171 8  1/8 102    273 170 8  1/11 95    192 139 8  1/12 70    205 140 8  1/13 82    259 164 8  1/16 112    255 158 8  1/18 118    162 212   1/21 88    166 245  1/22 94    219  164 8  1.26 196    251 179 8    Bps: 140/80s at home  Clinical ASCVD: Yes  (dx aortic atherosclerosis)  High-risk conditions: Age 74 or older  A/P: Following discussion and approval by Dr. Darrick Huntsmanullo, the following medication changes were made:   #Diabetes - Currently uncontrolled though improved with addition of [name] per SMBG results, most recent A1c 7.8% on 02/09/2018, worsened from 7.5% on 10/06/2017; Goal A1c <7%. 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 Per post-prandial readings, patient requires more prandial coverage with lunch, which will likely improve her pre-supper readings. It does not appear that she is deriving much post-prandial benefit from glipizide therapy, appropriate to discontinue to reduce risk of hypoglycemia - Start taking [name] 4 units with lunch, and  decrease supper dose to 6 units. Continue to monitor fasting and post-prandial BG to evaluate [name] dose. - Discontinue glipizide.  - Continue pioglitazone 15 mg daily, metformin XR 1000 mg BID, Levemir 24 units QAM, 12 units QPM. At next visit, will evaluate changing Levemir to Basaglar, as patient can receive Basaglar for free through [employer] Patient Assistance, and this would be a once daily basal versus twice daily.  #Hypertension currently controlled. Goal BP <140/90, though a more stringent goal may be appropriate given patient tolerability. Patient reports that she has had her home BP cuff for many years, therefore it may not be accurate, as BP was very well controlled in clinic. D/t bradycardia, will reduce carvedilol dose. If further BP control is needed moving forward, can consider changing HCTZ to chlorthalidone for improved potency.  - Decrease carvedilol to 12.5 mg BID.  - Continue HCTZ 25 mg QAM, lisinopril 20 mg BID, and amlodipine 10 mg QPM. Patient could not remember if amlodipine had worsened peripheral edema when it was originally started, but will keep this consideration in mind. - Will discuss future work up for reasons for resistant HTN with PCP Dr. Lorin PicketScott  #ASCVD risk - hx atherosclerosis; LDL well controlled at goal <70 on simvastatin therapy - Continue simvastatin 10 mg daily  Written patient instructions provided.  Total time in face to face counseling 60 minutes.    Follow up in Pharmacist Clinic Visit 4 weeks.  Lourena Simmondsatherine E Phuc Kluttz, PharmD PGY2 Ambulatory Care Pharmacy Resident Phone: [phone]   Reviewed above information.  Agree with assessment and plan.   Dr Lorin PicketScott  I have reviewed the above information and agree with above.   Duncan Dulleresa Tullo, MD

## 2018-05-12 ENCOUNTER — Other Ambulatory Visit: Payer: Self-pay | Admitting: Internal Medicine

## 2018-05-13 ENCOUNTER — Ambulatory Visit: Payer: Self-pay

## 2018-05-13 NOTE — Telephone Encounter (Signed)
This is just an [place]. If I need to schedule her, let me know. I will f/u with pt

## 2018-05-13 NOTE — Telephone Encounter (Signed)
Pt confirmed no other symptoms. She does not want to be seen before her appt on Monday. Rash has not spread. She did go get hydrocortisone cream OTC as directed by triage nurse. Advised patient if sx worsen or if she develops any other symptoms

## 2018-05-13 NOTE — Telephone Encounter (Signed)
Just confirm with pt no other symptoms (for example fever, etc).  Agree with need for evaluation.  Unclear what to prescribe without seeing.

## 2018-05-13 NOTE — Telephone Encounter (Signed)
Pt called stating that she has a rash mainly on her chest. She has some spots on her back and a couple on both shoulders. She describes the rash as pinky finger tip sized bumps that are red and itchy. She noticed the rash on Monday.  She denies changes to medications.  She states that there has been change to her medications but only stopping not and new medications.  She can think of no reason for the rash.  She is seeking advice for treating the itch which she rates at 7-8. She states that the bumps do not appear to be fluid filled. She denies that they look infected. Home care advice read to patient. Pt verbalized understanding of instructions. Pt will call back if symptoms worsen. Pt has physical scheduled for Monday and refuses need for appointment at this time. Reason for Disposition . Mild localized rash  Answer Assessment - Initial Assessment Questions 1.) CALLER DIAGNOSIS: "What do you think is causing the rash?" (e.g., Chickenpox, Hives, Impetigo, Athlete's Foot, etc.)  2.) LOCATION:  "Is it widespread or localized?"  3.) NEW MEDICATIONS: "Are you taking any new medicine?" Unsure what is causing the rash Chest and back and shoulder right and left chest is worse. No recent changes to medications  Answer Assessment - Initial Assessment Questions 1. APPEARANCE of RASH: "Describe the rash."      End of little finger in size 2. LOCATION: "Where is the rash located?"      Chest back both shoulders 3. NUMBER: "How many spots are there?"      More on chest 4. SIZE: "How big are the spots?" (Inches, centimeters or compare to size of a coin)      Size of pinky finger tip 5. ONSET: "When did the rash start?"      Monday morning 6. ITCHING: "Does the rash itch?" If so, ask: "How bad is the itch?"  (Scale 1-10; or mild, moderate, severe)     7-8 7. PAIN: "Does the rash hurt?" If so, ask: "How bad is the pain?"  (Scale 1-10; or mild, moderate, severe)     Burning sensation off and on 8. OTHER  SYMPTOMS: "Do you have any other symptoms?" (e.g., fever)     no 9. PREGNANCY: "Is there any chance you are pregnant?" "When was your last menstrual period?"     N/A  Protocols used: RASH OR REDNESS - LOCALIZED-A-AH, RASH - GUIDELINE SELECTION-A-AH

## 2018-05-18 ENCOUNTER — Ambulatory Visit (INDEPENDENT_AMBULATORY_CARE_PROVIDER_SITE_OTHER): Payer: Medicare HMO | Admitting: Internal Medicine

## 2018-05-18 ENCOUNTER — Encounter: Payer: Self-pay | Admitting: Internal Medicine

## 2018-05-18 VITALS — BP 120/65 | HR 67 | Temp 98.2°F | Wt 235.2 lb

## 2018-05-18 DIAGNOSIS — Z794 Long term (current) use of insulin: Secondary | ICD-10-CM | POA: Diagnosis not present

## 2018-05-18 DIAGNOSIS — R69 Illness, unspecified: Secondary | ICD-10-CM | POA: Diagnosis not present

## 2018-05-18 DIAGNOSIS — E1140 Type 2 diabetes mellitus with diabetic neuropathy, unspecified: Secondary | ICD-10-CM

## 2018-05-18 DIAGNOSIS — R002 Palpitations: Secondary | ICD-10-CM

## 2018-05-18 DIAGNOSIS — K589 Irritable bowel syndrome without diarrhea: Secondary | ICD-10-CM

## 2018-05-18 DIAGNOSIS — E538 Deficiency of other specified B group vitamins: Secondary | ICD-10-CM

## 2018-05-18 DIAGNOSIS — Z0000 Encounter for general adult medical examination without abnormal findings: Secondary | ICD-10-CM

## 2018-05-18 DIAGNOSIS — E785 Hyperlipidemia, unspecified: Secondary | ICD-10-CM | POA: Diagnosis not present

## 2018-05-18 DIAGNOSIS — R21 Rash and other nonspecific skin eruption: Secondary | ICD-10-CM

## 2018-05-18 DIAGNOSIS — Z6841 Body Mass Index (BMI) 40.0 and over, adult: Secondary | ICD-10-CM

## 2018-05-18 DIAGNOSIS — F439 Reaction to severe stress, unspecified: Secondary | ICD-10-CM

## 2018-05-18 DIAGNOSIS — I10 Essential (primary) hypertension: Secondary | ICD-10-CM

## 2018-05-18 DIAGNOSIS — I700 Atherosclerosis of aorta: Secondary | ICD-10-CM

## 2018-05-18 DIAGNOSIS — R0683 Snoring: Secondary | ICD-10-CM

## 2018-05-18 DIAGNOSIS — R600 Localized edema: Secondary | ICD-10-CM

## 2018-05-18 LAB — MICROALBUMIN / CREATININE URINE RATIO
CREATININE, U: 87.6 mg/dL
Microalb Creat Ratio: 4.1 mg/g (ref 0.0–30.0)
Microalb, Ur: 3.6 mg/dL — ABNORMAL HIGH (ref 0.0–1.9)

## 2018-05-18 LAB — BASIC METABOLIC PANEL
BUN: 25 mg/dL — ABNORMAL HIGH (ref 6–23)
CO2: 26 mEq/L (ref 19–32)
Calcium: 9.8 mg/dL (ref 8.4–10.5)
Chloride: 103 mEq/L (ref 96–112)
Creatinine, Ser: 1.28 mg/dL — ABNORMAL HIGH (ref 0.40–1.20)
GFR: 40.8 mL/min — ABNORMAL LOW (ref >60.00)
Glucose, Bld: 202 mg/dL — ABNORMAL HIGH (ref 70–99)
Potassium: 5.1 mEq/L (ref 3.5–5.1)
Sodium: 141 mEq/L (ref 135–145)

## 2018-05-18 LAB — LIPID PANEL
Cholesterol: 112 mg/dL (ref 0–200)
HDL: 45.7 mg/dL (ref >39.00)
LDL Cholesterol: 35 mg/dL (ref 0–99)
NonHDL: 66.13
Total CHOL/HDL Ratio: 2
Triglycerides: 157 mg/dL — ABNORMAL HIGH (ref 0.0–149.0)
VLDL: 31.4 mg/dL (ref 0.0–40.0)

## 2018-05-18 LAB — CBC WITH DIFFERENTIAL/PLATELET
Basophils Absolute: 0.1 10*3/uL (ref 0.0–0.1)
Basophils Relative: 1.2 % (ref 0.0–3.0)
Eosinophils Absolute: 0.5 10*3/uL (ref 0.0–0.7)
Eosinophils Relative: 5.3 % — ABNORMAL HIGH (ref 0.0–5.0)
HEMATOCRIT: 38.9 % (ref 36.0–46.0)
Hemoglobin: 12.6 g/dL (ref 12.0–15.0)
LYMPHS PCT: 22.2 % (ref 12.0–46.0)
Lymphs Abs: 2.1 10*3/uL (ref 0.7–4.0)
MCHC: 32.5 g/dL (ref 30.0–36.0)
MCV: 89 fl (ref 78.0–100.0)
Monocytes Absolute: 0.9 10*3/uL (ref 0.1–1.0)
Monocytes Relative: 9.2 % (ref 3.0–12.0)
Neutro Abs: 5.8 10*3/uL (ref 1.4–7.7)
Neutrophils Relative %: 62.1 % (ref 43.0–77.0)
Platelets: 230 10*3/uL (ref 150.0–400.0)
RBC: 4.37 Mil/uL (ref 3.87–5.11)
RDW: 13.8 % (ref 11.5–15.5)
WBC: 9.3 10*3/uL (ref 4.0–10.5)

## 2018-05-18 LAB — HEPATIC FUNCTION PANEL
ALK PHOS: 89 U/L (ref 39–117)
ALT: 19 U/L (ref 0–35)
AST: 21 U/L (ref 0–37)
Albumin: 4.5 g/dL (ref 3.5–5.2)
Bilirubin, Direct: 0.1 mg/dL (ref 0.0–0.3)
TOTAL PROTEIN: 7.3 g/dL (ref 6.0–8.3)
Total Bilirubin: 0.6 mg/dL (ref 0.2–1.2)

## 2018-05-18 LAB — GLUCOSE, POCT (MANUAL RESULT ENTRY): POC Glucose: 223 mg/dl — AB (ref 70–99)

## 2018-05-18 LAB — HEMOGLOBIN A1C: Hgb A1c MFr Bld: 8.5 % — ABNORMAL HIGH (ref 4.6–6.5)

## 2018-05-18 NOTE — Assessment & Plan Note (Addendum)
Sugars as outlined.  Discussed low carb diet and exercise.  Follow met b and a1c  Seeing Catie.  Has been adjusting medication. Check a1c today.

## 2018-05-18 NOTE — Progress Notes (Signed)
Patient ID: Beth Tyler, female   DOB: 12-19-1945, 73 y.o.   MRN: [id number]   Subjective:    Patient ID: Beth Tyler, female    DOB: [birth year], 73 y.o.   MRN: [id number]  HPI  Patient here for her physical exam.   She reports she is doing relatively well.  Brought in no recorded sugar readings.  States am sugars averaging 90-120.  No significant problems with low sugars.  Only occurred on two occasions when she did not eat.  States pm sugars averaging 160-220.  No chest pain.  States she has noticed her heart pounding - in the am.  Notices when gets out of bed and walks to the bathroom.  No increased heart rate, but can feel it pounding.  Occurring everyday.  Breathing stable.  No acid reflux.  Persistent problems with her bowels.  Stable.  Takes imodium prn.  She reports she is taking carvedilol 1/2 tablet bid (instead of 1 bid).  Reports increased snoring.  Does not feel rested when she wakes up.  Increased fatigue. Discussed possible sleep apnea.  Persistent rash - chest.     Past Medical History:  Diagnosis Date  . Anemia   . Arthritis   . B12 deficiency   . Carpal tunnel syndrome, bilateral   . Diabetes mellitus ([place]) 02/17/2012  . Diabetes mellitus, type II ([place])   . Dyspnea   . Gastritis   . GERD (gastroesophageal reflux disease)   . Hypercholesterolemia   . Hypertension   . IBS (irritable bowel syndrome)   . Neuropathy    legs  . Pernicious anemia   . Vertigo    no episodes fo 9 or 10 yrs   Past Surgical History:  Procedure Laterality Date  . ANTERIOR VITRECTOMY Right 12/01/2017   Procedure: ANTERIOR VITRECTOMY;  Surgeon: Eulogio Bear, MD;  Location: [facility];  Service: Ophthalmology;  Laterality: Right;  . BREAST BIOPSY Right 1980   neg  . BREAST SURGERY  1980   benign cyst removal  . CATARACT EXTRACTION W/PHACO Left 10/13/2017   Procedure: CATARACT EXTRACTION PHACO AND INTRAOCULAR LENS PLACEMENT ([place])  LEFT  DIABETES;  Surgeon: Eulogio Bear, MD;  Location: [facility];  Service: Ophthalmology;  Laterality: Left;  Diabetic - insulin and oral meds  . CATARACT EXTRACTION W/PHACO Right 12/01/2017   Procedure: CATARACT EXTRACTION PHACO AND INTRAOCULAR LENS PLACEMENT ([place])  RIGHT DIABETIC;  Surgeon: Eulogio Bear, MD;  Location: [facility];  Service: Ophthalmology;  Laterality: Right;  Diabetic - insulin and oral  . CHOLECYSTECTOMY  1983   Family History  Problem Relation Age of Onset  . Diabetes Father   . Kidney disease Father   . Heart disease Father   . Alcohol abuse Mother   . Dementia Mother   . Diabetes Mother   . Diabetes Other   . Heart disease Son 9       CABG  . Hemophilia Brother   . Uterine cancer Sister   . Diabetes Sister   . COPD Sister   . Breast cancer Maternal Aunt 51  . Breast cancer Daughter 66  . Stroke Daughter    Social History   Socioeconomic History  . Marital status: Widowed    Spouse name: Not on file  . Number of children: 4  . Years of education: Not on file  . Highest education level: Not on file  Occupational History  . Not on file  Social Needs  .  Financial resource strain: Not hard at all  . Food insecurity:    Worry: Never true    Inability: Never true  . Transportation needs:    Medical: No    Non-medical: No  Tobacco Use  . Smoking status: Never Smoker  . Smokeless tobacco: Never Used  Substance and Sexual Activity  . Alcohol use: No    Alcohol/week: 0.0 standard drinks  . Drug use: No  . Sexual activity: Not Currently  Lifestyle  . Physical activity:    Days per week: Not on file    Minutes per session: Not on file  . Stress: Not on file  Relationships  . Social connections:    Talks on phone: Not on file    Gets together: Not on file    Attends religious service: Not on file    Active member of club or organization: Not on file    Attends meetings of clubs or organizations: Not on file    Relationship status: Not on file  Other  Topics Concern  . Not on file  Social History Narrative   Lives with her husband. Has four children. Manager of an apartment building. No tobacco, alcohol or other drug use    Outpatient Encounter Medications as of 05/18/2018  Medication Sig  . amLODipine (NORVASC) 10 MG tablet TAKE 1 TABLET BY MOUTH ONCE DAILY  . Blood Glucose Monitoring Suppl (ONETOUCH VERIO FLEX SYSTEM) w/Device KIT   . carvedilol (COREG) 12.5 MG tablet Take 1 tablet (12.5 mg total) by mouth 2 (two) times daily with a meal.  . fluticasone (CUTIVATE) 0.05 % cream USE AS DIRECTED  . fluticasone furoate-vilanterol (BREO ELLIPTA) 200-25 MCG/INH AEPB Inhale 1 puff into the lungs daily.  . Fluticasone-Salmeterol (ADVAIR DISKUS) 250-50 MCG/DOSE AEPB Inhale 1 puff into the lungs 2 (two) times daily.  . hydrochlorothiazide (HYDRODIURIL) 25 MG tablet Take 1 tablet (25 mg total) by mouth daily.  . Insulin Detemir (LEVEMIR) 100 UNIT/ML Pen Inject 24 units in the morning & 12 units at bedtime  . insulin lispro ([name]) 100 UNIT/ML injection Inject 4 units with lunch and 6 units with supper daily  . Insulin Pen Needle 32G X 8 MM MISC Use as directed three times daily Dx E11.9  . lisinopril (PRINIVIL,ZESTRIL) 20 MG tablet Take 1 tablet (20 mg total) by mouth 2 (two) times daily.  . magnesium oxide (MAG-OX) 400 (241.3 Mg) MG tablet TAKE 1 TABLET BY MOUTH ONCE DAILY  . metFORMIN (GLUCOPHAGE XR) 500 MG 24 hr tablet Take two tablets bid  . ONETOUCH DELICA LANCETS 57Q MISC   . ONETOUCH VERIO test strip   . pantoprazole (PROTONIX) 40 MG tablet TAKE 1 TABLET BY MOUTH TWICE DAILY BEFORE MEAL(S)  . pioglitazone (ACTOS) 15 MG tablet Take 1 tablet (15 mg total) by mouth daily.  . simvastatin (ZOCOR) 10 MG tablet TAKE 1 TABLET BY MOUTH AT BEDTIME  . triamcinolone cream (KENALOG) 0.1 % APPLY CREAM TOPICALLY TWICE DAILY  . [DISCONTINUED] atenolol (TENORMIN) 100 MG tablet TAKE 1 TABLET BY MOUTH IN THE MORNING AND 1 2 (ONE HALF) IN THE EVENING   No  facility-administered encounter medications on file as of 05/18/2018.     Review of Systems  Constitutional: Positive for fatigue. Negative for appetite change and unexpected weight change.  HENT: Negative for congestion and sinus pressure.   Eyes: Negative for pain and visual disturbance.  Respiratory: Negative for cough and chest tightness.   Cardiovascular: Negative for chest pain and leg swelling.  Heart pounding as outlined.    Gastrointestinal: Positive for diarrhea. Negative for abdominal pain, nausea and vomiting.  Genitourinary: Negative for difficulty urinating and dysuria.  Musculoskeletal: Negative for joint swelling and myalgias.  Skin: Positive for rash. Negative for color change.  Neurological: Negative for dizziness, light-headedness and headaches.  Hematological: Negative for adenopathy. Does not bruise/bleed easily.  Psychiatric/Behavioral: Negative for agitation and dysphoric mood.       Objective:    Physical Exam Constitutional:      General: She is not in acute distress.    Appearance: Normal appearance. She is well-developed.  HENT:     Nose: Nose normal. No congestion.     Mouth/Throat:     Pharynx: No oropharyngeal exudate or posterior oropharyngeal erythema.  Eyes:     General: No scleral icterus.       Right eye: No discharge.        Left eye: No discharge.  Neck:     Musculoskeletal: Neck supple.     Thyroid: No thyromegaly.  Cardiovascular:     Rate and Rhythm: Normal rate and regular rhythm.  Pulmonary:     Effort: No tachypnea, accessory muscle usage or respiratory distress.     Breath sounds: Normal breath sounds. No decreased breath sounds or wheezing.  Chest:     Breasts:        Right: No inverted nipple, mass, nipple discharge or tenderness (no axillary adenopathy).        Left: No inverted nipple, mass, nipple discharge or tenderness (no axilarry adenopathy).  Abdominal:     General: Bowel sounds are normal.     Palpations:  Abdomen is soft.     Tenderness: There is no abdominal tenderness.  Musculoskeletal:        General: No tenderness.     Comments: No increased swelling.   Lymphadenopathy:     Cervical: No cervical adenopathy.  Skin:    Findings: No erythema.     Comments: Erythematous based rash on chest.    Neurological:     Mental Status: She is alert and oriented to person, place, and time.  Psychiatric:        Mood and Affect: Mood normal.        Behavior: Behavior normal.     BP 120/65   Pulse 67   Temp 98.2 F (36.8 C) (Oral)   Wt 235 lb 3.2 oz (106.7 kg)   SpO2 97%   BMI 43.72 kg/m  Wt Readings from Last 3 Encounters:  05/18/18 235 lb 3.2 oz (106.7 kg)  05/04/18 242 lb 3.2 oz (109.9 kg)  04/06/18 246 lb (111.6 kg)     Lab Results  Component Value Date   WBC 9.3 05/18/2018   HGB 12.6 05/18/2018   HCT 38.9 05/18/2018   PLT 230.0 05/18/2018   GLUCOSE 202 (H) 05/18/2018   CHOL 112 05/18/2018   TRIG 157.0 (H) 05/18/2018   HDL 45.70 05/18/2018   LDLDIRECT 74.4 02/21/2012   LDLCALC 35 05/18/2018   ALT 19 05/18/2018   AST 21 05/18/2018   NA 141 05/18/2018   K 5.1 05/18/2018   CL 103 05/18/2018   CREATININE 1.28 (H) 05/18/2018   BUN 25 (H) 05/18/2018   CO2 26 05/18/2018   TSH 2.32 10/06/2017   HGBA1C 8.5 (H) 05/18/2018   MICROALBUR 3.6 (H) 05/18/2018    Mm 3d Screen Breast Bilateral  Result Date: 04/07/2018 CLINICAL DATA:  Screening. EXAM: DIGITAL SCREENING BILATERAL MAMMOGRAM WITH TOMO AND CAD  COMPARISON:  Previous exam(s). ACR Breast Density Category b: There are scattered areas of fibroglandular density. FINDINGS: There are no findings suspicious for malignancy. Images were processed with CAD. IMPRESSION: No mammographic evidence of malignancy. A result letter of this screening mammogram will be mailed directly to the patient. RECOMMENDATION: Screening mammogram in one year. (Code:SM-B-01Y) BI-RADS CATEGORY  1: Negative. Electronically Signed   By: Claudie Revering M.D.    On: 04/07/2018 08:20       Assessment & Plan:   Problem List Items Addressed This Visit    Aortic atherosclerosis ([place])    On simvastatin.       B12 deficiency    Continue B12 injections.        Bilateral leg edema    Has been evaluated by [facility].  Stable.        BMI 40.0-44.9, adult ([place])    Has lost weight.  Discussed diet and exercise.        Diabetes mellitus ([place])    Sugars as outlined.  Discussed low carb diet and exercise.  Follow met b and a1c  Seeing Catie.  Has been adjusting medication. Check a1c today.         Relevant Orders   POCT Glucose (CBG) (Completed)   Hemoglobin A1c (Completed)   Basic metabolic panel (Completed)   Microalbumin / creatinine urine ratio (Completed)   Ambulatory referral to Cardiology   Health care maintenance    Physical today 05/18/18.  Mammogram 04/07/18 - Birads I.  Colonoscopy 11/2011.        Hyperlipidemia    On simvastatin.  Low cholesterol diet and exercise.  Follow lipid panel and liver function tests.        Relevant Orders   Hepatic function panel (Completed)   Lipid panel (Completed)   Hypertension    Blood pressure on recheck improved - 122/78.  Continue current medication regimen.  Follow pressures.  Follow metabolic panel.       Relevant Orders   CBC with Differential/Platelet (Completed)   Ambulatory referral to Cardiology   IBS (irritable bowel syndrome)    Bowels stable.        Snoring    Increased snoring.  Does not feel rested when wakes up.  Fatigue.  Hypertension.  Refer to pulmonary for further evaluation and testing for sleep apnea.        Relevant Orders   Ambulatory referral to Pulmonology   Stress    Discussed with her today.  Overall she feels she is handling things relatively well.  Follow.         Other Visit Diagnoses    Pounding heartbeat    -  Primary   EKG - SR with no acute ischemic changes.  Given risk factors and some sob with exertion, will have cardiology evaluate with question  of need for further w/up.     Relevant Orders   EKG 12-Lead (Completed)   Ambulatory referral to Cardiology   Rash       Steroid cream.  Antihistamine as directed.  Follow.  Notify me if persistent.         Einar Pheasant, MD

## 2018-05-18 NOTE — Assessment & Plan Note (Signed)
Physical today 05/18/18.  Mammogram 04/07/18 - Birads I.  Colonoscopy 11/2011.

## 2018-05-18 NOTE — Assessment & Plan Note (Signed)
On simvastatin.   

## 2018-05-18 NOTE — Assessment & Plan Note (Signed)
Blood pressure on recheck improved - 122/78.  Continue current medication regimen.  Follow pressures.  Follow metabolic panel.

## 2018-05-19 ENCOUNTER — Other Ambulatory Visit: Payer: Self-pay | Admitting: Internal Medicine

## 2018-05-19 DIAGNOSIS — R7989 Other specified abnormal findings of blood chemistry: Secondary | ICD-10-CM

## 2018-05-19 NOTE — Progress Notes (Signed)
Order placed for f/u met b.  

## 2018-05-24 ENCOUNTER — Encounter: Payer: Self-pay | Admitting: Internal Medicine

## 2018-05-24 DIAGNOSIS — R0683 Snoring: Secondary | ICD-10-CM | POA: Insufficient documentation

## 2018-05-24 NOTE — Assessment & Plan Note (Signed)
Increased snoring.  Does not feel rested when wakes up.  Fatigue.  Hypertension.  Refer to pulmonary for further evaluation and testing for sleep apnea.

## 2018-05-24 NOTE — Assessment & Plan Note (Signed)
Bowels stable.  

## 2018-05-24 NOTE — Assessment & Plan Note (Signed)
On simvastatin.  Low cholesterol diet and exercise.  Follow lipid panel and liver function tests.   

## 2018-05-24 NOTE — Assessment & Plan Note (Signed)
Continue B12 injections.   

## 2018-05-24 NOTE — Assessment & Plan Note (Signed)
Has lost weight.  Discussed diet and exercise.

## 2018-05-24 NOTE — Assessment & Plan Note (Signed)
Discussed with her today.  Overall she feels she is handling things relatively well.  Follow.   

## 2018-05-24 NOTE — Assessment & Plan Note (Signed)
Has been evaluated by [facility].  Stable.  

## 2018-05-25 ENCOUNTER — Other Ambulatory Visit (INDEPENDENT_AMBULATORY_CARE_PROVIDER_SITE_OTHER): Payer: Medicare HMO

## 2018-05-25 DIAGNOSIS — R7989 Other specified abnormal findings of blood chemistry: Secondary | ICD-10-CM | POA: Diagnosis not present

## 2018-05-25 LAB — BASIC METABOLIC PANEL
BUN: 22 mg/dL (ref 6–23)
CO2: 27 mEq/L (ref 19–32)
CREATININE: 1.28 mg/dL — AB (ref 0.40–1.20)
Calcium: 9.6 mg/dL (ref 8.4–10.5)
Chloride: 103 mEq/L (ref 96–112)
GFR: 40.8 mL/min — ABNORMAL LOW (ref >60.00)
Glucose, Bld: 183 mg/dL — ABNORMAL HIGH (ref 70–99)
Potassium: 5 mEq/L (ref 3.5–5.1)
Sodium: 139 mEq/L (ref 135–145)

## 2018-05-26 ENCOUNTER — Ambulatory Visit: Payer: Medicare HMO

## 2018-05-27 ENCOUNTER — Other Ambulatory Visit: Payer: Self-pay | Admitting: Internal Medicine

## 2018-05-27 DIAGNOSIS — R7989 Other specified abnormal findings of blood chemistry: Secondary | ICD-10-CM

## 2018-05-27 DIAGNOSIS — R69 Illness, unspecified: Secondary | ICD-10-CM | POA: Diagnosis not present

## 2018-05-27 NOTE — Progress Notes (Signed)
Order placed for renal ultrasound and nephrology referral.   

## 2018-06-01 ENCOUNTER — Ambulatory Visit: Payer: Medicare HMO | Admitting: Pharmacist

## 2018-06-01 DIAGNOSIS — R69 Illness, unspecified: Secondary | ICD-10-CM | POA: Diagnosis not present

## 2018-06-05 ENCOUNTER — Other Ambulatory Visit: Payer: Self-pay | Admitting: Internal Medicine

## 2018-06-05 DIAGNOSIS — Z794 Long term (current) use of insulin: Principal | ICD-10-CM

## 2018-06-05 DIAGNOSIS — E1140 Type 2 diabetes mellitus with diabetic neuropathy, unspecified: Secondary | ICD-10-CM

## 2018-06-08 ENCOUNTER — Telehealth: Payer: Self-pay | Admitting: Internal Medicine

## 2018-06-08 NOTE — Telephone Encounter (Signed)
Pt should not be taking atenolol. She is currently taking coreg 12.5 mg. Pharmacy is aware.

## 2018-06-08 NOTE — Telephone Encounter (Signed)
carvedilol (COREG) 12.5 MG tablet / Pharmacy stated pt on carvedilol and atenolol. They want to clarify if pt is to be on both medications. 90 day supply filled for atenolol and carvedilol in January 2020. Atenolol not on current medication list. Please advise pharmacy.    [facility] [address], [place] - [address]          [phone] (Phone) [phone] (Fax)

## 2018-06-08 NOTE — Telephone Encounter (Signed)
Copied from CRM [id number]. Topic: Quick Communication - Rx Refill/Question >> Jun 08, 2018  2:05 PM [place], [place] D wrote: Medication: carvedilol (COREG) 12.5 MG tablet / Pharmacy stated that pt seems to be on carvedilol and atenolol. They wanted to clarify if pt is supposed to be on both medications. 90 day supply filled for atenolol and carvedilol in January 2020. Atenolol not on current medication list. Please advise pharmacy.  Has the patient contacted their pharmacy? Yes.   (Agent: If no, request that the patient contact the pharmacy for the refill.) (Agent: If yes, when and what did the pharmacy advise?)  Preferred Pharmacy (with phone number or street name): [facility] [address] [place] - [address] [phone] (Phone) [phone] (Fax)  Agent: Please be advised that RX refills may take up to 3 business days. We ask that you follow-up with your pharmacy.

## 2018-06-10 ENCOUNTER — Ambulatory Visit (INDEPENDENT_AMBULATORY_CARE_PROVIDER_SITE_OTHER): Payer: Medicare HMO

## 2018-06-10 DIAGNOSIS — E538 Deficiency of other specified B group vitamins: Secondary | ICD-10-CM

## 2018-06-10 MED ORDER — CYANOCOBALAMIN 1000 MCG/ML IJ SOLN
1000.0000 ug | Freq: Once | INTRAMUSCULAR | Status: AC
  Administered 2018-06-10: 1000 ug via INTRAMUSCULAR

## 2018-06-10 NOTE — Progress Notes (Addendum)
Pt in today for monthly B12 injection. Given in RD. Tolerated well. No complaints or concerns at this time.   Reviewed.  Dr Lorin Picket

## 2018-06-15 ENCOUNTER — Encounter: Payer: Self-pay | Admitting: Pharmacist

## 2018-06-15 ENCOUNTER — Ambulatory Visit (INDEPENDENT_AMBULATORY_CARE_PROVIDER_SITE_OTHER): Payer: Medicare HMO | Admitting: Pharmacist

## 2018-06-15 DIAGNOSIS — Z794 Long term (current) use of insulin: Secondary | ICD-10-CM | POA: Diagnosis not present

## 2018-06-15 DIAGNOSIS — E1140 Type 2 diabetes mellitus with diabetic neuropathy, unspecified: Secondary | ICD-10-CM

## 2018-06-15 MED ORDER — BASAGLAR KWIKPEN 100 UNIT/ML ~~LOC~~ SOPN: 30.0000 [IU] | PEN_INJECTOR | Freq: Every day | SUBCUTANEOUS | 1 refills | Status: DC

## 2018-06-15 MED ORDER — INSULIN LISPRO 100 UNIT/ML ~~LOC~~ SOLN: SUBCUTANEOUS | 0 refills | Status: DC

## 2018-06-15 NOTE — Progress Notes (Addendum)
S:     Chief Complaint  Patient presents with  . Medication Management    Diabetes    Patient arrives in good spirits, ambulating without assistance. Presents for diabetes evaluation, education, and management at the request of Dr. Nicki Reaper (referred on 02/11/2018) Last seen in [facility] on 05/04/2018 - at that time, adjustments were made to her mealtime insulin doses. Last seen by PCP on 05/18/2018 - at that point, it was noted that her A1c had increased. She was also referred to cardiology for pounding heartbeat w/ some SOB on exertion.   Today, she reports that she is doing well with medications. She continues to endorse itching/rash on her chest, but does not believe it to have been related to initiation of any medications  Insurance coverage/medication affordability: [employer]; receives [name] through [employer] patient assistance through 04/08/2019.  Patient reports adherence with medications.  Current diabetes medications include: metformin ER 2000 mg daily, pioglitazone 15 mg daily, Levemir 24 units QAM, 12 units QPM; [name] 4 units w/ lunch, 6 units w/ supper Current hypertension medications include: amlodipine 10 mg daily, carvedilol 12.5 mg BID, HCTZ 25 mg daily, lisinopril 20 mg BID Current hyperlipidemic medications include: simvastatin 10 mg daily  Patient denies hypoglycemia.   Patient reported dietary habits: Eats 2-3 meals/day Breakfast: Depends on the day, but if anything, then coffee Lunch: Usually light; can of vegetables;  Dinner: Cutting back on red meat; chicken, cutting back on bread/carbohydrates;  Snacks: Has cut back on salty snacks  Drinks: Water;    O:  Physical Exam Constitutional:      Appearance: Normal appearance.  Neurological:     Mental Status: She is alert.     Review of Systems  All other systems reviewed and are negative.  Lab Results  Component Value Date   HGBA1C 8.5 (H) 05/18/2018   Vitals:   06/15/18 1422  BP:  125/75  Pulse: 68    Basic Metabolic Panel BMP Latest Ref Rng & Units 05/25/2018 05/18/2018 02/10/2018  Glucose 70 - 99 mg/dL 183(H) 202(H) -  BUN 6 - 23 mg/dL 22 25(H) -  Creatinine 0.40 - 1.20 mg/dL 1.28(H) 1.28(H) -  Sodium 135 - 145 mEq/L 139 141 -  Potassium 3.5 - 5.1 mEq/L 5.0 5.1 4.6  Chloride 96 - 112 mEq/L 103 103 -  CO2 19 - 32 mEq/L 27 26 -  Calcium 8.4 - 10.5 mg/dL 9.6 9.8 -  eGFR ~ 40 mL/min  Lipid Panel     Component Value Date/Time   CHOL 112 05/18/2018 1131   TRIG 157.0 (H) 05/18/2018 1131   HDL 45.70 05/18/2018 1131   CHOLHDL 2 05/18/2018 1131   VLDL 31.4 05/18/2018 1131   LDLCALC 35 05/18/2018 1131   LDLDIRECT 74.4 02/21/2012 1006    Self-Monitored Blood Glucose Results Fasting SMBG: 80-120s  2 hour post-lunch: 125-165  2 hours post-supper: 125 lowest, but sometimes up to 180s-200s  Clinical ASCVD: No  (but aortic atherosclerosis)  A/P: #Diabetes - Currently uncontrolled, most recent A1c 8.5% on 05/13/2018, Goal A1c <7%; Appropriate to further optimize mealtime insulin doses. Continue to avoid GLP1 d/t concurrent IBS-D; would be cautious with SGLT2 given decreased renal function and current SGLT2 labeling - Increase supper [name] to 7-8 units if consuming a meal higher in carbohydrates or that historically results in BG >180. Continue 6 units with smaller/lower carbohydrate meals - Finish current supply of Levemir with 24 units QAM and 12 units QPM. At that point, will  switch to [employer]. Completing paperwork for Basaglar 30 units once daily. Once Dr. Bary Leriche signature is obtained, will fax to [facility].  - Continue metformin XR 2000 mg daily, pioglitazone 15 mg once daily. Moving forward, consider pioglitazone discontinuation to hopefully reduce LEE.    #Hypertension currently controlled. Goal BP <140/90.  - Continue amlodipine 10 mg daily, carvedilol 12.5 mg BID, HCTZ 25 mg daily, lisinopril 20 mg BID  #ASCVD risk - primary prevention in patient aged  56-75 with DM; patient adequately managed on current therapy - Continue simvastatin 10 mg daily  Written patient instructions provided.  Total time in face to face counseling 20 minutes.    Follow up in Pharmacist Clinic Visit 4 weeks.  De Hollingshead, PharmD, CPP PGY2 Ambulatory Care Pharmacy Resident Phone: [phone]   I have reviewed the above information and agree with above.   Deborra Medina, MD

## 2018-06-15 NOTE — Assessment & Plan Note (Signed)
#  Diabetes - Currently uncontrolled, most recent A1c 8.5% on 05/13/2018, Goal A1c <7%; Appropriate to further optimize mealtime insulin doses. Continue to avoid GLP1 d/t concurrent IBS-D; would be cautious with SGLT2 given decreased renal function and current SGLT2 labeling - Increase supper [name] to 7-8 units if consuming a meal higher in carbohydrates or that historically results in BG >180. Continue 6 units with smaller/lower carbohydrate meals - Finish current supply of Levemir with 24 units QAM and 12 units QPM. At that point, will switch to [name]. Completing paperwork for Basaglar 30 units once daily. Once Dr. Roby Lofts signature is obtained, will fax to [facility].  - Continue metformin XR 2000 mg daily, pioglitazone 15 mg once daily. Moving forward, consider pioglitazone discontinuation to hopefully reduce LEE.

## 2018-06-15 NOTE — Patient Instructions (Addendum)
It was great to see you today!  We are going to make a slight change:  1) If you have a larger supper that usually results in post-supper readings greater than 170-180, use 7 or 8 units of [name] instead of 6.   Continue 4 units of [name] with supper and continue Levemir 24 units/12 units until you finish the supply you have. We will send a prescription for Basaglar (insulin glargine) to [employer] patient assistance program. Call me when you switch to [name].   Continue checking your blood sugar: 1) Fasting, before eating breakfast. The goal fasting blood sugar is less than 130 mg/dL 2) 2 hours after the largest meal of your day. The goal 2-hour after meal blood sugar is less than 180 mg/dL   Please feel free to contact clinic with any questions or concerns. Schedule follow up with me in ~ 4 weeks.   Catie Feliz Beam, PharmD

## 2018-06-16 ENCOUNTER — Other Ambulatory Visit: Payer: Self-pay | Admitting: Internal Medicine

## 2018-06-22 NOTE — Progress Notes (Signed)
I have reviewed the above note and agree. I was available to the pharmacist for consultation.  Avyukth Bontempo, MD  

## 2018-06-24 ENCOUNTER — Ambulatory Visit: Payer: Medicare HMO

## 2018-06-26 ENCOUNTER — Telehealth: Payer: Self-pay | Admitting: Pharmacist

## 2018-06-26 NOTE — Telephone Encounter (Signed)
Contacted [employer] patient assistance. Basaglar request was received, is in processing now and once shipped will arrive in 7-14 business days.   Catie Feliz Beam, PharmD, CPP PGY2 Ambulatory Care Pharmacy Resident, [facility] Phone: [phone]

## 2018-07-01 ENCOUNTER — Ambulatory Visit: Payer: Medicare HMO

## 2018-07-05 ENCOUNTER — Other Ambulatory Visit: Payer: Self-pay | Admitting: Internal Medicine

## 2018-07-07 ENCOUNTER — Other Ambulatory Visit: Payer: Self-pay | Admitting: Pharmacy Technician

## 2018-07-07 NOTE — Patient Outreach (Signed)
[facility]) Care Management  07/07/2018  MICHALLE KALLENBACH February 01, 1946 [id number]    Care coordination call placed to [facility] in regards to patient's application for Basaglar.  Spoke to [place] who informed that the provider's office needs to submit a prescription to [facility] either by faxing it to [phone] or calling [employer] who will then transfer the call to [facility] so the provider can give a verbal.  Will route note to [facility] RPh Catie Feliz Beam for assistance with this matter.  Malon Branton P. Jaelani Posa, CPhT [facility] [phone]

## 2018-07-08 ENCOUNTER — Telehealth: Payer: Self-pay | Admitting: Pharmacist

## 2018-07-08 NOTE — Telephone Encounter (Signed)
Received message from [facility], CPhT that with [employer]' new process of providing medications through [facility], [facility] requires the prescription be 1) faxed 2) escribed, or 3) verbally provided.   Contacted [facility] pharmacy via [employer]. Provided verbal for Basaglar (30 units daily) and [name] (up to 8 units TID with meals), 4 month supply with 2 refills.   Catie Feliz Beam, PharmD, CPP PGY2 Ambulatory Care Pharmacy Resident, [facility] Phone: [phone]

## 2018-07-13 DIAGNOSIS — I10 Essential (primary) hypertension: Secondary | ICD-10-CM | POA: Diagnosis not present

## 2018-07-13 DIAGNOSIS — E1122 Type 2 diabetes mellitus with diabetic chronic kidney disease: Secondary | ICD-10-CM | POA: Diagnosis not present

## 2018-07-13 DIAGNOSIS — N183 Chronic kidney disease, stage 3 (moderate): Secondary | ICD-10-CM | POA: Diagnosis not present

## 2018-07-15 ENCOUNTER — Other Ambulatory Visit: Payer: Self-pay

## 2018-07-15 ENCOUNTER — Ambulatory Visit (INDEPENDENT_AMBULATORY_CARE_PROVIDER_SITE_OTHER): Payer: Medicare HMO

## 2018-07-15 DIAGNOSIS — E538 Deficiency of other specified B group vitamins: Secondary | ICD-10-CM | POA: Diagnosis not present

## 2018-07-15 MED ORDER — CYANOCOBALAMIN 1000 MCG/ML IJ SOLN
1000.0000 ug | Freq: Once | INTRAMUSCULAR | Status: AC
  Administered 2018-07-15: 1000 ug via INTRAMUSCULAR

## 2018-07-15 NOTE — Progress Notes (Addendum)
Beth Tyler presents today for injection per MD orders. B12 injection administered IM in left Upper Arm. Administration without incident. Patient tolerated well.  Pt also picked up her insulin medication from the pharmacist (caty) it was delivered here and I gave it to the pt.    [name],cma  Reviewed.  Dr Lorin Picket

## 2018-07-19 ENCOUNTER — Other Ambulatory Visit: Payer: Self-pay | Admitting: Internal Medicine

## 2018-07-19 DIAGNOSIS — I10 Essential (primary) hypertension: Secondary | ICD-10-CM

## 2018-07-20 ENCOUNTER — Ambulatory Visit: Payer: Medicare HMO | Admitting: Pharmacist

## 2018-07-20 ENCOUNTER — Telehealth: Payer: Self-pay | Admitting: Pharmacist

## 2018-07-20 DIAGNOSIS — Z794 Long term (current) use of insulin: Principal | ICD-10-CM

## 2018-07-20 DIAGNOSIS — E1140 Type 2 diabetes mellitus with diabetic neuropathy, unspecified: Secondary | ICD-10-CM

## 2018-07-20 MED ORDER — INSULIN LISPRO 100 UNIT/ML ~~LOC~~ SOLN: SUBCUTANEOUS | 0 refills | Status: DC

## 2018-07-20 MED ORDER — INSULIN PEN NEEDLE 32G X 4 MM MISC: 3 refills | Status: DC

## 2018-07-20 NOTE — Telephone Encounter (Signed)
S:     Chief Complaint  Patient presents with  . Medication Management    Diabetes    Patient contacted telephonically for diabetes evaluation, education, and management at the request of Dr. Lorin Picket (referred on 02/11/2018). Last seen by primary care provider on 05/18/2018. Last seen in Pharmacy Clinic on 06/15/2018 - at that time, Levemir was switched to [facility] for affordability purposes, as patient qualified for [employer] Patient Assistance.   In the interim, patient has received both Basaglar and [name] from [employer] Patient Assistance. Reports tolerability of current regimen. She notes that she has started to be able to tell that carbohydrate-heavy meals (rice, potatoes, pasta) result in higher post-prandial BG elevations. Continues to endorse controlled fasting BG <130.   Insurance coverage/medication affordability: [employer] - [name] and Hospital doctor through [employer] Patient Assistance through 04/08/2019  Patient reports adherence with medications.  Current diabetes medications include: metformin XR 1000 mg BID, pioglitazone 15 mg daily, Basaglar 30 units daily, [name] 4 units with lunch and 6 units with supper  Patient reports hypoglycemic events twice overnight; both times, she injected insulin but then "ended up eating less than I had planned". Treated with peanut butter that she keeps beside her bed.  O:  Lab Results  Component Value Date   HGBA1C 8.5 (H) 05/18/2018   Basic Metabolic Panel BMP Latest Ref Rng & Units 05/25/2018 05/18/2018 02/10/2018  Glucose 70 - 99 mg/dL 435(W) 861(U) -  BUN 6 - 23 mg/dL 22 83(F) -  Creatinine 0.40 - 1.20 mg/dL 2.90(S) 1.11(B) -  Sodium 135 - 145 mEq/L 139 141 -  Potassium 3.5 - 5.1 mEq/L 5.0 5.1 4.6  Chloride 96 - 112 mEq/L 103 103 -  CO2 19 - 32 mEq/L 27 26 -  Calcium 8.4 - 10.5 mg/dL 9.6 9.8 -   Lipid Panel     Component Value Date/Time   CHOL 112 05/18/2018 1131   TRIG 157.0 (H) 05/18/2018 1131   HDL 45.70 05/18/2018 1131   CHOLHDL 2  05/18/2018 1131   VLDL 31.4 05/18/2018 1131   LDLCALC 35 05/18/2018 1131   LDLDIRECT 74.4 02/21/2012 1006    Self-Monitored Blood Glucose Results Fasting SMBG: 201-123 (all <130) Bedtime BG: 140-203; notes readings of 180s-200s when she eats carbohydrate-heavy meals  A/P: #Diabetes - Currently uncontrolled, most recent A1c 8.5% on 05/18/2018. Goal A1c <7%. Appropriate to continue to modify prandial insulin regimen to match carbohydrate load at meals. SGLT2/GLP1 inappropriate with current renal function/hx IBS.  - Modify regimen to give [name] 4 units with lower carbohydrate meal/6 units with higher carbohydrate meal with lunch; 6 units with lower carbohydrate meal/8 units with higher carbohydrate meal with supper - Continue Basaglar 30 units daily, metformin XR 1000 mg BID, pioglitazone 15 units daily.     Follow up in Pharmacist Clinic Visit 4 weeks  [name], PharmD, CPP PGY2 Ambulatory Care Pharmacy Resident Phone: [phone]

## 2018-07-29 ENCOUNTER — Telehealth: Payer: Self-pay | Admitting: Pharmacist

## 2018-07-29 ENCOUNTER — Ambulatory Visit
Admission: RE | Admit: 2018-07-29 | Discharge: 2018-07-29 | Disposition: A | Discharge status: Home / Self Care | Payer: Medicare HMO | Source: Ambulatory Visit | Attending: Internal Medicine | Admitting: Internal Medicine

## 2018-07-29 ENCOUNTER — Other Ambulatory Visit: Payer: Self-pay

## 2018-07-29 DIAGNOSIS — N189 Chronic kidney disease, unspecified: Secondary | ICD-10-CM | POA: Diagnosis not present

## 2018-07-29 DIAGNOSIS — R748 Abnormal levels of other serum enzymes: Secondary | ICD-10-CM | POA: Diagnosis not present

## 2018-07-29 DIAGNOSIS — R7989 Other specified abnormal findings of blood chemistry: Secondary | ICD-10-CM | POA: Insufficient documentation

## 2018-07-29 NOTE — Telephone Encounter (Signed)
Waiting for fax.  Hold until we receive.  Thanks

## 2018-07-29 NOTE — Telephone Encounter (Signed)
Received fax from [facility] requesting updated refill information and permission to ship Novolog to patient's home. I believe she is already receiving Basaglar at her home. Contacted patient to follow up on this, left message stating that we were sending this refill information to [facility], and that if she did not need a Novolog refill to just let them know when they call her to set up shipping. Would prefer [name] to have necessary information on file.   Will fax refill form to Dr. Lorin Picket for sign/date and fax to [facility].   Catie Feliz Beam, PharmD, CPP PGY2 Ambulatory Care Pharmacy Resident, [facility] Phone: [phone]

## 2018-07-30 NOTE — Telephone Encounter (Signed)
Faxed to [name] 

## 2018-07-30 NOTE — Telephone Encounter (Signed)
Received form, placed in quick sign 

## 2018-07-30 NOTE — Telephone Encounter (Signed)
Signed and placed in box.   

## 2018-07-31 ENCOUNTER — Telehealth: Payer: Self-pay | Admitting: Internal Medicine

## 2018-07-31 NOTE — Telephone Encounter (Signed)
Per schedule, pt has been switched to a doxy visit

## 2018-07-31 NOTE — Telephone Encounter (Signed)
Informed patient of lab results, but she wants to know should she keep her appointment for 08/03/2018. Patients phone number   [phone]     Please advise

## 2018-08-04 ENCOUNTER — Encounter: Payer: Self-pay | Admitting: Internal Medicine

## 2018-08-04 ENCOUNTER — Other Ambulatory Visit: Payer: Self-pay

## 2018-08-04 ENCOUNTER — Ambulatory Visit (INDEPENDENT_AMBULATORY_CARE_PROVIDER_SITE_OTHER): Payer: Medicare HMO | Admitting: Internal Medicine

## 2018-08-04 DIAGNOSIS — F439 Reaction to severe stress, unspecified: Secondary | ICD-10-CM

## 2018-08-04 DIAGNOSIS — N183 Chronic kidney disease, stage 3 unspecified: Secondary | ICD-10-CM

## 2018-08-04 DIAGNOSIS — I10 Essential (primary) hypertension: Secondary | ICD-10-CM

## 2018-08-04 DIAGNOSIS — K219 Gastro-esophageal reflux disease without esophagitis: Secondary | ICD-10-CM | POA: Diagnosis not present

## 2018-08-04 DIAGNOSIS — E1140 Type 2 diabetes mellitus with diabetic neuropathy, unspecified: Secondary | ICD-10-CM | POA: Diagnosis not present

## 2018-08-04 DIAGNOSIS — R600 Localized edema: Secondary | ICD-10-CM

## 2018-08-04 DIAGNOSIS — Z794 Long term (current) use of insulin: Secondary | ICD-10-CM

## 2018-08-04 DIAGNOSIS — E785 Hyperlipidemia, unspecified: Secondary | ICD-10-CM | POA: Diagnosis not present

## 2018-08-04 DIAGNOSIS — R69 Illness, unspecified: Secondary | ICD-10-CM | POA: Diagnosis not present

## 2018-08-04 DIAGNOSIS — E538 Deficiency of other specified B group vitamins: Secondary | ICD-10-CM

## 2018-08-04 MED ORDER — METFORMIN HCL ER 500 MG PO TB24: ORAL_TABLET | ORAL | 1 refills | Status: DC

## 2018-08-04 MED ORDER — HYDROCHLOROTHIAZIDE 25 MG PO TABS: 25.0000 mg | ORAL_TABLET | Freq: Every day | ORAL | 1 refills | Status: DC

## 2018-08-04 MED ORDER — CARVEDILOL 12.5 MG PO TABS: 12.5000 mg | ORAL_TABLET | Freq: Two times a day (BID) | ORAL | 1 refills | Status: DC

## 2018-08-04 MED ORDER — PIOGLITAZONE HCL 15 MG PO TABS: 15.0000 mg | ORAL_TABLET | Freq: Every day | ORAL | 1 refills | Status: DC

## 2018-08-04 NOTE — Progress Notes (Signed)
Patient ID: Beth Tyler, female   DOB: [birth year], 73 y.o.   MRN: [id number] Virtual Visit via Video Note  This visit type was conducted due to national recommendations for restrictions regarding the COVID-19 pandemic (e.g. social distancing).  This format is felt to be most appropriate for this patient at this time.  All issues noted in this document were discussed and addressed.  No physical exam was performed (except for noted visual exam findings with Video Visits).   I connected with Beth Tyler on 08/04/18 at 11:00 AM EDT by a video enabled telemedicine application and verified that I am speaking with the correct person using two identifiers. Location patient: home Location provider: work Persons participating in the virtual visit: patient, provider  I discussed the limitations, risks, security and privacy concerns of performing an evaluation and management service by video and the availability of in person appointments. The patient expressed understanding and agreed to proceed.  Interactive audio and video telecommunications were attempted between this provider and patient, however failed due to technical difficulties.  We continued and completed visit with audio only.   Reason for visit: scheduled follow up.    HPI: She reports she is doing relatively well.  Now taking basaglar.  Adjusting her doses with each meal.  No chest pain.  No sob.  No acid reflux.  Last a1c 8.5.  States her blood pressures have been averaging 120-130/76.  Only taking carvedilol 1/2 bid now.  Doing well on her current regimen.  Saw Dr Saralyn Pilar.  Stable.  Taking protonix to help with acid reflux.  Controlled.  She has seen pulmonary.  Recommended sleep study.  Bowels stable.   Renal ultrasound ok.  Creatinine 1.28.  Has appt with nephrology 08/12/18.  Handling stress.     ROS: See pertinent positives and negatives per HPI.  Past Medical History:  Diagnosis Date  . Anemia   . Arthritis   . B12 deficiency    . Carpal tunnel syndrome, bilateral   . Diabetes mellitus ([place]) 02/17/2012  . Diabetes mellitus, type II ([place])   . Dyspnea   . Gastritis   . GERD (gastroesophageal reflux disease)   . Hypercholesterolemia   . Hypertension   . IBS (irritable bowel syndrome)   . Neuropathy    legs  . Pernicious anemia   . Vertigo    no episodes fo 9 or 10 yrs    Past Surgical History:  Procedure Laterality Date  . ANTERIOR VITRECTOMY Right 12/01/2017   Procedure: ANTERIOR VITRECTOMY;  Surgeon: Eulogio Bear, MD;  Location: [place];  Service: Ophthalmology;  Laterality: Right;  . BREAST BIOPSY Right 1980   neg  . BREAST SURGERY  1980   benign cyst removal  . CATARACT EXTRACTION W/PHACO Left 10/13/2017   Procedure: CATARACT EXTRACTION PHACO AND INTRAOCULAR LENS PLACEMENT ([place])  LEFT  DIABETES;  Surgeon: Eulogio Bear, MD;  Location: [place];  Service: Ophthalmology;  Laterality: Left;  Diabetic - insulin and oral meds  . CATARACT EXTRACTION W/PHACO Right 12/01/2017   Procedure: CATARACT EXTRACTION PHACO AND INTRAOCULAR LENS PLACEMENT ([place])  RIGHT DIABETIC;  Surgeon: Eulogio Bear, MD;  Location: [place];  Service: Ophthalmology;  Laterality: Right;  Diabetic - insulin and oral  . CHOLECYSTECTOMY  1983    Family History  Problem Relation Age of Onset  . Diabetes Father   . Kidney disease Father   . Heart disease Father   . Alcohol abuse Mother   .  Dementia Mother   . Diabetes Mother   . Diabetes Other   . Heart disease Son 33       CABG  . Hemophilia Brother   . Uterine cancer Sister   . Diabetes Sister   . COPD Sister   . Breast cancer Maternal Aunt 51  . Breast cancer Daughter 30  . Stroke Daughter     SOCIAL HX: reviewed.    Current Outpatient Medications:  .  amLODipine (NORVASC) 10 MG tablet, TAKE 1 TABLET BY MOUTH ONCE DAILY, Disp: 90 tablet, Rfl: 1 .  Blood Glucose Monitoring Suppl (ONETOUCH VERIO FLEX SYSTEM) w/Device KIT, ,  Disp: , Rfl: 0 .  carvedilol (COREG) 12.5 MG tablet, Take 1 tablet (12.5 mg total) by mouth 2 (two) times daily with a meal., Disp: 180 tablet, Rfl: 1 .  fluticasone (CUTIVATE) 0.05 % cream, USE AS DIRECTED (Patient not taking: Reported on 06/15/2018), Disp: 30 g, Rfl: 0 .  fluticasone furoate-vilanterol (BREO ELLIPTA) 200-25 MCG/INH AEPB, Inhale 1 puff into the lungs daily., Disp: 28 each, Rfl: 0 .  Fluticasone-Salmeterol (ADVAIR DISKUS) 250-50 MCG/DOSE AEPB, Inhale 1 puff into the lungs 2 (two) times daily., Disp: 60 each, Rfl: 3 .  hydrochlorothiazide (HYDRODIURIL) 25 MG tablet, Take 1 tablet (25 mg total) by mouth daily., Disp: 90 tablet, Rfl: 1 .  Insulin Glargine (BASAGLAR KWIKPEN) 100 UNIT/ML SOPN, Inject 0.3 mLs (30 Units total) into the skin daily., Disp: 4 pen, Rfl: 1 .  insulin lispro ([name]) 100 UNIT/ML injection, Inject 4-6 units with lunch and 6-8 units with supper daily, Disp: 3 mL, Rfl: 0 .  Insulin Pen Needle 32G X 4 MM MISC, Use to inject insulin three times daily, Disp: 300 each, Rfl: 3 .  lisinopril (PRINIVIL,ZESTRIL) 20 MG tablet, TAKE 1 TABLET BY MOUTH ONCE DAILY IN THE MORNING AND 2 TABLETS BY MOUTH AT BEDTIME, Disp: 270 tablet, Rfl: 0 .  magnesium oxide (MAG-OX) 400 (241.3 Mg) MG tablet, TAKE 1 TABLET BY MOUTH ONCE DAILY, Disp: 90 tablet, Rfl: 3 .  metFORMIN (GLUCOPHAGE-XR) 500 MG 24 hr tablet, Take 2 tablets by mouth twice daily, Disp: 360 tablet, Rfl: 1 .  ONETOUCH DELICA LANCETS 79G MISC, , Disp: , Rfl: 99 .  ONETOUCH VERIO test strip, , Disp: , Rfl: 99 .  pantoprazole (PROTONIX) 40 MG tablet, TAKE 1 TABLET BY MOUTH TWICE DAILY BEFORE MEAL(S), Disp: 180 tablet, Rfl: 2 .  pioglitazone (ACTOS) 15 MG tablet, Take 1 tablet (15 mg total) by mouth daily., Disp: 90 tablet, Rfl: 1 .  simvastatin (ZOCOR) 10 MG tablet, TAKE 1 TABLET BY MOUTH AT BEDTIME, Disp: 90 tablet, Rfl: 1 .  triamcinolone cream (KENALOG) 0.1 %, APPLY CREAM TOPICALLY TWICE DAILY, Disp: 15 g, Rfl:  0  EXAM:  GENERAL: alert.  Answering questions appropriately.  Sounds to be in no acute distress.    PSYCH/NEURO: pleasant and cooperative, no obvious depression or anxiety, speech and thought processing grossly intact  ASSESSMENT AND PLAN:  Discussed the following assessment and plan:  Essential hypertension  B12 deficiency  Bilateral leg edema  Type 2 diabetes mellitus with diabetic neuropathy, with long-term current use of insulin (HCC)  Hyperlipidemia, unspecified hyperlipidemia type  Stress  CKD (chronic kidney disease), stage III (HCC)  Gastroesophageal reflux disease, esophagitis presence not specified  B12 deficiency Continue B12 injections.   Bilateral leg edema Has been evaluated by [facility].  Stable.    Diabetes mellitus Last a1c 8.5.  Now using basaglar. Adjusting dose with meals.  States sugars remaining less than 180.  Discussed low carb diet and exercise.  Follow met b and a1c.    Hyperlipidemia On simvastatin.  Low cholesterol diet and exercise.  Follow lipid panel and liver function tests.    Hypertension Blood pressure has been doing well.  conitnue same medications.  Follow pressures.  Follow metabolic panel.    Stress Handling stress.  Follow.    CKD (chronic kidney disease), stage III ([place]) Has appt with nephrology 08/12/18.  Avoid antiinflammatories.  Follow.    GERD (gastroesophageal reflux disease) Controlled on protonix.      I discussed the assessment and treatment plan with the patient. The patient was provided an opportunity to ask questions and all were answered. The patient agreed with the plan and demonstrated an understanding of the instructions.   The patient was advised to call back or seek an in-person evaluation if the symptoms worsen or if the condition fails to improve as anticipated.  I provided 25 minutes of non-face-to-face time during this encounter.   Einar Pheasant, MD

## 2018-08-08 ENCOUNTER — Encounter: Payer: Self-pay | Admitting: Internal Medicine

## 2018-08-08 DIAGNOSIS — N183 Chronic kidney disease, stage 3 unspecified: Secondary | ICD-10-CM | POA: Insufficient documentation

## 2018-08-08 DIAGNOSIS — K219 Gastro-esophageal reflux disease without esophagitis: Secondary | ICD-10-CM | POA: Insufficient documentation

## 2018-08-08 NOTE — Assessment & Plan Note (Signed)
Last a1c 8.5.  Now using basaglar. Adjusting dose with meals.  States sugars remaining less than 180.  Discussed low carb diet and exercise.  Follow met b and a1c.

## 2018-08-08 NOTE — Assessment & Plan Note (Signed)
On simvastatin.  Low cholesterol diet and exercise.  Follow lipid panel and liver function tests.   

## 2018-08-08 NOTE — Assessment & Plan Note (Signed)
Blood pressure has been doing well.  conitnue same medications.  Follow pressures.  Follow metabolic panel.

## 2018-08-08 NOTE — Assessment & Plan Note (Signed)
Continue B12 injections.   

## 2018-08-08 NOTE — Assessment & Plan Note (Signed)
Has been evaluated by [facility].  Stable.  

## 2018-08-08 NOTE — Assessment & Plan Note (Signed)
Controlled on protonix.   

## 2018-08-08 NOTE — Assessment & Plan Note (Signed)
Handling stress.  Follow.   

## 2018-08-08 NOTE — Assessment & Plan Note (Signed)
Has appt with nephrology 08/12/18.  Avoid antiinflammatories.  Follow.

## 2018-08-13 DIAGNOSIS — E1122 Type 2 diabetes mellitus with diabetic chronic kidney disease: Secondary | ICD-10-CM | POA: Diagnosis not present

## 2018-08-13 DIAGNOSIS — N183 Chronic kidney disease, stage 3 (moderate): Secondary | ICD-10-CM | POA: Diagnosis not present

## 2018-08-13 DIAGNOSIS — I10 Essential (primary) hypertension: Secondary | ICD-10-CM | POA: Diagnosis not present

## 2018-08-17 ENCOUNTER — Telehealth: Payer: Self-pay | Admitting: Pharmacist

## 2018-08-17 ENCOUNTER — Ambulatory Visit: Payer: Medicare HMO | Admitting: Pharmacist

## 2018-08-17 DIAGNOSIS — E1140 Type 2 diabetes mellitus with diabetic neuropathy, unspecified: Secondary | ICD-10-CM

## 2018-08-17 MED ORDER — INSULIN LISPRO 100 UNIT/ML ~~LOC~~ SOLN: SUBCUTANEOUS | 0 refills | Status: DC

## 2018-08-17 NOTE — Telephone Encounter (Signed)
Reviewed above information.  Agree with assessment and plan.    Dr Bobbye Reinitz 

## 2018-08-17 NOTE — Telephone Encounter (Addendum)
S:     Chief Complaint  Patient presents with  . Medication Management    Diabetes     Patient contacted telephonically for diabetes evaluation, education, and management at the request of Dr. Lorin Picket (referred on 02/11/2018). Last seen by primary care provider on 08/04/2018.  Last seen in Pharmacy Clinic on 07/20/2018 - at that time her [name] was adjusted to 4-6 units with lunch and 6-8 units with supper.   Beth Tyler states she is doing ok. She reports she has had a lot of office visits the past few months. She endorses a few episodes of hypoglycemia, including one episode on 5/6 were her blood glucose was down to 42. She got shaky and felt like she could not move. Her granddaughter brought her a soft drink and her hypoglycemia resolved. She reports hypoglycemia 2-3 a month to the 60-70s. She always has glucose tabs on hand but states her blood glucose does not fall during day.   She has IBS with ongoing issues with bowel urgency so she does not eat when going out for the day because has to use the restroom. She notes that she had been working with GI to try and find pancreatic enzyme supplements to help control symptoms.   She reports some weight loss, down to 227lbs.   Insurance coverage/medication affordability: [employer] Medicare - [name] and Hospital doctor through [employer] Patient Assistance through 04/08/2019  Patient reports adherence with medications.  Current diabetes medications include: metformin XR 1000 mg BID, pioglitazone 15 mg daily, Basaglar 30 units daily, [name] 4-6 units with lunch and 6-8 units with supper  Patient reports hypoglycemic s/sx including  shakiness. Patient denies hyperglycemic symptoms including polyuria, polydipsia, polyphagia, nocturia, neuropathy, blurred vision.  Patient reported dietary habits: Eats 2-3 meals/day Breakfast: eggs  Lunch: variable, usually sandwich  Dinner: biggest meal - meat and 2 veggies  Snacks: peanut butter crackers  Drinks: no sodas,  unsweet tea, water  Patient reported exercise habits: "not a whole lot," gets up during commercials to walk around, she tries not to be sedentary, exercises with legs (run in place, stretching)    O:   Lab Results  Component Value Date   HGBA1C 8.5 (H) 05/18/2018    Basic Metabolic Panel BMP Latest Ref Rng & Units 05/25/2018 05/18/2018 02/10/2018  Glucose 70 - 99 mg/dL 989(Q) 119(E) -  BUN 6 - 23 mg/dL 22 17(E) -  Creatinine 0.40 - 1.20 mg/dL 0.81(K) 4.81(E) -  Sodium 135 - 145 mEq/L 139 141 -  Potassium 3.5 - 5.1 mEq/L 5.0 5.1 4.6  Chloride 96 - 112 mEq/L 103 103 -  CO2 19 - 32 mEq/L 27 26 -  Calcium 8.4 - 10.5 mg/dL 9.6 9.8 -   Lipid Panel     Component Value Date/Time   CHOL 112 05/18/2018 1131   TRIG 157.0 (H) 05/18/2018 1131   HDL 45.70 05/18/2018 1131   CHOLHDL 2 05/18/2018 1131   VLDL 31.4 05/18/2018 1131   LDLCALC 35 05/18/2018 1131   LDLDIRECT 74.4 02/21/2012 1006    Self-Monitored Blood Glucose Results  fasting pre lunch after lunch  pre dinner after dinner HS   19-Apr 89 242  189 72 93  20-Apr 118   195 136   21-Apr  203 160  186   11-May 134         A/P: #Diabetes - Currently uncontrolled, most recent A1c 8.5% on 05/18/18, improved/worsened from 7.8% on 02/09/18; Goal A1c <7%. Patient reports hypoglycemic events. Patient reports adherence  with medication. Control is suboptimal due to large swings in blood glucose. - Increase lunch dose of [name] to 6 units if blood glucose is less than 200 and 8 units of blood glucose is greater than 200. Decrease supper dose of [name] to 6 units.   - Continue metformin 1000 mg BID and pioglitazone 15 mg daily.  - Encouraged patient to take blood glucose more regularly and may consider additional dose of [name] for breakfast based off post-prandial breakfast readings - Patient would benefit from CGM - would qualify for personal but also 14 day profession CGM would be valuable. Will investigate coverage moving forward, pending  A1c results - Replacing bolus insulin with a GLP-1 would also benefit patient to help minimize lows, however hesitant to initiate with ongoing bowel issues and nausea   Written patient instructions provided.  Total time in face to face counseling 25 minutes.    Follow up in Pharmacist Clinic Visit in 5 weeks.   Patient seen with Beth Tyler, PharmD, PGY1.   Beth Feliz Beamravis, PharmD, CPP PGY2 Ambulatory Care Pharmacy Resident Phone: [phone]

## 2018-08-18 DIAGNOSIS — Z6841 Body Mass Index (BMI) 40.0 and over, adult: Secondary | ICD-10-CM | POA: Diagnosis not present

## 2018-08-18 DIAGNOSIS — I10 Essential (primary) hypertension: Secondary | ICD-10-CM | POA: Diagnosis not present

## 2018-08-18 DIAGNOSIS — R0602 Shortness of breath: Secondary | ICD-10-CM | POA: Diagnosis not present

## 2018-08-18 DIAGNOSIS — I890 Lymphedema, not elsewhere classified: Secondary | ICD-10-CM | POA: Diagnosis not present

## 2018-08-25 ENCOUNTER — Other Ambulatory Visit: Payer: Medicare HMO

## 2018-08-25 ENCOUNTER — Ambulatory Visit: Payer: Medicare HMO

## 2018-08-27 ENCOUNTER — Other Ambulatory Visit: Payer: Self-pay | Admitting: Internal Medicine

## 2018-08-28 NOTE — Telephone Encounter (Signed)
Last OV 07/2018 and last refill 05/2018.  Ok to fill?

## 2018-08-31 ENCOUNTER — Other Ambulatory Visit: Payer: Self-pay | Admitting: Internal Medicine

## 2018-09-02 ENCOUNTER — Telehealth: Payer: Self-pay | Admitting: *Deleted

## 2018-09-02 DIAGNOSIS — E785 Hyperlipidemia, unspecified: Secondary | ICD-10-CM

## 2018-09-02 DIAGNOSIS — I10 Essential (primary) hypertension: Secondary | ICD-10-CM

## 2018-09-02 DIAGNOSIS — E1140 Type 2 diabetes mellitus with diabetic neuropathy, unspecified: Secondary | ICD-10-CM

## 2018-09-02 DIAGNOSIS — Z794 Long term (current) use of insulin: Secondary | ICD-10-CM

## 2018-09-02 NOTE — Telephone Encounter (Signed)
I added a tsh.  Thank you for ordering the other labs.  Need to schedule lab appt.   Thanks.

## 2018-09-02 NOTE — Telephone Encounter (Signed)
I have ordered a lipid, bmp, hepatic and A1C. Is there anything you would like to add to her labs?

## 2018-09-02 NOTE — Telephone Encounter (Signed)
Copied from CRM [id number]. Topic: Appointment Scheduling - Scheduling Inquiry for Clinic >> Sep 01, 2018  4:29 PM Reggie Pile, [place] wrote: Reason for CRM: Patient is calling in stating she needs to schedule her B-12 injection and in need of having labs as well. Call back is [phone].

## 2018-09-03 ENCOUNTER — Other Ambulatory Visit: Payer: Self-pay | Admitting: Pharmacy Technician

## 2018-09-03 NOTE — Patient Outreach (Signed)
[facility]) Care Management  09/03/2018  Beth Tyler 1945/09/17 [id number]    Incoming call received from patient in regards to [facility] application for [name].  Spoke to patient, HIPAA identifiers verified. Patient was calling to inform that she had not heard from [facility] regarding her refill. She informed [facility] RPh Catie Feliz Beam had told her to be expecting a call. Informed patient to go ahead and give [facility] a call to request a refill and home delivery. Patient verbalized understanding.  Will outreach [facility] RPh Catie Feliz Beam to make her aware of this situation as she is embedded in Dr. Roby Lofts clinic.  Zipporah Finamore P. Hisayo Delossantos, CPhT Mus[facility] Care Management [phone]

## 2018-09-03 NOTE — Telephone Encounter (Signed)
Left message to call back and schedule b12 injection and fasting lab appt

## 2018-09-07 NOTE — Telephone Encounter (Signed)
Pt scheduled  

## 2018-09-10 ENCOUNTER — Ambulatory Visit (INDEPENDENT_AMBULATORY_CARE_PROVIDER_SITE_OTHER): Payer: Medicare HMO

## 2018-09-10 ENCOUNTER — Other Ambulatory Visit (INDEPENDENT_AMBULATORY_CARE_PROVIDER_SITE_OTHER): Payer: Medicare HMO

## 2018-09-10 ENCOUNTER — Other Ambulatory Visit: Payer: Self-pay

## 2018-09-10 DIAGNOSIS — E538 Deficiency of other specified B group vitamins: Secondary | ICD-10-CM

## 2018-09-10 DIAGNOSIS — I10 Essential (primary) hypertension: Secondary | ICD-10-CM | POA: Diagnosis not present

## 2018-09-10 DIAGNOSIS — E1140 Type 2 diabetes mellitus with diabetic neuropathy, unspecified: Secondary | ICD-10-CM

## 2018-09-10 DIAGNOSIS — Z794 Long term (current) use of insulin: Secondary | ICD-10-CM | POA: Diagnosis not present

## 2018-09-10 DIAGNOSIS — E785 Hyperlipidemia, unspecified: Secondary | ICD-10-CM

## 2018-09-10 DIAGNOSIS — E875 Hyperkalemia: Secondary | ICD-10-CM

## 2018-09-10 LAB — BASIC METABOLIC PANEL
BUN: 31 mg/dL — ABNORMAL HIGH (ref 6–23)
CO2: 27 mEq/L (ref 19–32)
Calcium: 9.5 mg/dL (ref 8.4–10.5)
Chloride: 100 mEq/L (ref 96–112)
Creatinine, Ser: 1.25 mg/dL — ABNORMAL HIGH (ref 0.40–1.20)
GFR: 41.9 mL/min — ABNORMAL LOW (ref >60.00)
Glucose, Bld: 97 mg/dL (ref 70–99)
Potassium: 5.1 mEq/L (ref 3.5–5.1)
Sodium: 137 mEq/L (ref 135–145)

## 2018-09-10 LAB — LIPID PANEL
Cholesterol: 113 mg/dL (ref 0–200)
HDL: 51.5 mg/dL (ref >39.00)
LDL Cholesterol: 38 mg/dL (ref 0–99)
NonHDL: 61.4
Total CHOL/HDL Ratio: 2
Triglycerides: 115 mg/dL (ref 0.0–149.0)
VLDL: 23 mg/dL (ref 0.0–40.0)

## 2018-09-10 LAB — HEPATIC FUNCTION PANEL
ALT: 17 U/L (ref 0–35)
AST: 15 U/L (ref 0–37)
Albumin: 4.3 g/dL (ref 3.5–5.2)
Alkaline Phosphatase: 87 U/L (ref 39–117)
Bilirubin, Direct: 0.1 mg/dL (ref 0.0–0.3)
Total Bilirubin: 0.6 mg/dL (ref 0.2–1.2)
Total Protein: 6.9 g/dL (ref 6.0–8.3)

## 2018-09-10 LAB — HEMOGLOBIN A1C: Hgb A1c MFr Bld: 7.8 % — ABNORMAL HIGH (ref 4.6–6.5)

## 2018-09-10 LAB — TSH: TSH: 2.7 u[IU]/mL (ref 0.35–4.50)

## 2018-09-10 MED ORDER — CYANOCOBALAMIN 1000 MCG/ML IJ SOLN
1000.0000 ug | Freq: Once | INTRAMUSCULAR | Status: AC
  Administered 2018-09-10: 11:00:00 1000 ug via INTRAMUSCULAR

## 2018-09-10 NOTE — Progress Notes (Addendum)
Beth Tyler presents today for injection per MD orders. B12 injection administered IM in left Upper Arm. Administration without incident. Patient tolerated well.  Nina,cma  Reviewed.  Dr Lorin Picket

## 2018-09-14 ENCOUNTER — Other Ambulatory Visit: Payer: Self-pay | Admitting: Internal Medicine

## 2018-09-15 DIAGNOSIS — R69 Illness, unspecified: Secondary | ICD-10-CM | POA: Diagnosis not present

## 2018-09-21 ENCOUNTER — Telehealth: Payer: Self-pay | Admitting: Pharmacist

## 2018-09-21 ENCOUNTER — Ambulatory Visit: Payer: Medicare HMO | Admitting: Pharmacist

## 2018-09-21 DIAGNOSIS — Z794 Long term (current) use of insulin: Secondary | ICD-10-CM

## 2018-09-21 DIAGNOSIS — E1140 Type 2 diabetes mellitus with diabetic neuropathy, unspecified: Secondary | ICD-10-CM

## 2018-09-21 MED ORDER — FREESTYLE LIBRE 14 DAY READER DEVI: 1.0000 | Freq: Four times a day (QID) | 0 refills | Status: DC

## 2018-09-21 MED ORDER — FREESTYLE LIBRE 14 DAY SENSOR MISC: 1.0000 | 2 refills | Status: DC

## 2018-09-21 NOTE — Assessment & Plan Note (Signed)
#  Diabetes - Currently uncontrolled but improved, most recent A1c 7.8% on 09/10/2018, improved from 8.5% on 05/18/2018; Goal A1c <7%. Patient reports hypoglycemic events x 3; appropriate to adjust insulin regimen - Decrease Basaglar to 26 units daily.  - Continue [name] 6 units with breakfast/lunch; Increase supper [name] dose to 8-10 units; 8 units if smaller supper, 10 units if larger.  - Extensive discussion regarding targeting goal 2 hour post prandial readings less than 180.  - Discussed that she could adjust lunch dose of [name] to 6-8 units if 2 hour post prandials are greater than 180 - Discussed that she may qualify for FreeStyle Libre CGM due to 3+ insulin injections daily. Will send in prescription to see if covered on her plan

## 2018-09-21 NOTE — Addendum Note (Signed)
Addended by: De Hollingshead on: 09/21/2018 12:02 PM   Modules accepted: Orders

## 2018-09-21 NOTE — Telephone Encounter (Signed)
[facility]. They noted that a 4 month supply of Basaglar was delivered on 07/10/2018, so patient should not be due for a Basaglar refill yet.   Contacted patient, she noted that she probably has a box in the back of the fridge that she missed. She will look for it, and if she cannot find it, contact me and we can call [facility] to see what they can do.   [place] to see if [employer] CGM is covered. They noted that it was non-formulary. Asked them to place on hold. Have removed from the patient's medication list. I've let patient know this.   Routing to Dr. Nicki Reaper to determine if she wants to schedule follow up with the patient prior to the current visit scheduled 01/2019.  Catie Darnelle Maffucci, PharmD, [place] PGY2 Ambulatory Care Pharmacy Resident, [facility] Phone: [phone]

## 2018-09-21 NOTE — Telephone Encounter (Signed)
Reviewed above information.  Agree with assessment and plan.  Schedule f/u with me in 6 weeks.    Dr Nicki Reaper

## 2018-09-21 NOTE — Telephone Encounter (Signed)
S:     Chief Complaint  Patient presents with  . Medication Management    Diabetes    Patient contacted telephonically for diabetes evaluation, education, and management at the request of Dr. Scott(referred on 02/11/2018). Last seen by primary care provider on4/28/2020.  Last seen in Pharmacy Clinic on 08/17/2018 - [name] adjustments were made.   Today, she reports 3 episodes of hypoglycemia in the past 5 weeks, 1 in the 40s and 2 in the 50s. With the episode of 42, she notes that she had a small supper, so she didn't take any mealtime [name]. Her bedtime BG result was 134.   Otherwise, she endorses being pleased with improvements in sugar control. She has still not been able to get in touch with [employer] regarding setting up shipping for [employer]. She notes she has 1 pen left (~11 day supply). She also reports being interested in the [employer] CGM.    Insurance coverage/medication affordability: [employer]na Medicare; receives Occupational psychologistBasaglar and [name] through [employer] patient assistance  Patient reports adherence with medications.  Current diabetes medications include: Basaglar 30 units QAM, [name] 6 units with breakfast/lunch (typically eats one or the other) and 6-8 units with supper (usually 8 units); metformin XR 1000 mg BID Current hypertension medications include: amlodipine 10 mg, carvedilol 12.5 mg BID, HCTZ 25 mg QAM, lisinopril 20 mg BID Current hyperlipidemic medications include: simvastatin 10 mg daily  Patient denies hypoglycemic episodes that awoke her from sleep x3;  O:   Lab Results  Component Value Date   HGBA1C 7.8 (H) 09/10/2018    Basic Metabolic Panel BMP Latest Ref Rng & Units 09/10/2018 05/25/2018 05/18/2018  Glucose 70 - 99 mg/dL 97 696(E183(H) 952(W202(H)  BUN 6 - 23 mg/dL 41(L31(H) 22 24(M25(H)  Creatinine 0.40 - 1.20 mg/dL 0.10(U1.25(H) 7.25(D1.28(H) 6.64(Q1.28(H)  Sodium 135 - 145 mEq/L 137 139 141  Potassium 3.5 - 5.1 mEq/L 5.1 5.0 5.1  Chloride 96 - 112 mEq/L 100 103 103  CO2 19 - 32  mEq/L 27 27 26   Calcium 8.4 - 10.5 mg/dL 9.5 9.6 9.8     Lipid Panel     Component Value Date/Time   CHOL 113 09/10/2018 1023   TRIG 115.0 09/10/2018 1023   HDL 51.50 09/10/2018 1023   CHOLHDL 2 09/10/2018 1023   VLDL 23.0 09/10/2018 1023   LDLCALC 38 09/10/2018 1023   LDLDIRECT 74.4 02/21/2012 1006    Self-Monitored Blood Glucose Results Date Fasting  HS  1-Jun 90 163  2-Jun 95 168  3-Jun 134 216  5-Jun 80 209  6-Jun 122 180  7-Jun 114 162  8-Jun 120 196  9-Jun 122 199  10-Jun 112 202  11-Jun 86 186  14-Jun 121 212  15-Jun 122   Average 190 190     Clinical ASCVD: No - but dx aortic atherosclerosis  A/P: #Diabetes - Currently uncontrolled but improved, most recent A1c 7.8% on 09/10/2018, improved from 8.5% on 05/18/2018; Goal A1c <7%. Patient reports hypoglycemic events x 3; appropriate to adjust insulin regimen - Decrease Basaglar to 26 units daily.  - Continue [name] 6 units with breakfast/lunch; Increase supper [name] dose to 8-10 units; 8 units if smaller supper, 10 units if larger.  - Extensive discussion regarding targeting goal 2 hour post prandial readings less than 180.  - Discussed that she could adjust lunch dose of [name] to 6-8 units if 2 hour post prandials are greater than 180 - Discussed that she may qualify for FreeStyle Libre CGM due to  3+ insulin injections daily. Will send in prescription to see if covered on her plan  Written patient instructions provided.  Total time in telephonic counseling 35 minutes.    Follow up in Pharmacist Clinic Visit TBD, next PCP visit scheduled 01/27/2019.  De Hollingshead, PharmD, [place] PGY2 Ambulatory Care Pharmacy Resident Phone: [phone]

## 2018-09-23 DIAGNOSIS — R69 Illness, unspecified: Secondary | ICD-10-CM | POA: Diagnosis not present

## 2018-10-14 ENCOUNTER — Ambulatory Visit (INDEPENDENT_AMBULATORY_CARE_PROVIDER_SITE_OTHER): Payer: Medicare HMO

## 2018-10-14 ENCOUNTER — Other Ambulatory Visit: Payer: Self-pay

## 2018-10-14 ENCOUNTER — Other Ambulatory Visit: Payer: Self-pay | Admitting: Internal Medicine

## 2018-10-14 DIAGNOSIS — E538 Deficiency of other specified B group vitamins: Secondary | ICD-10-CM

## 2018-10-14 MED ORDER — CYANOCOBALAMIN 1000 MCG/ML IJ SOLN
1000.0000 ug | Freq: Once | INTRAMUSCULAR | Status: AC
  Administered 2018-10-14: 1000 ug via INTRAMUSCULAR

## 2018-10-14 NOTE — Progress Notes (Addendum)
Patient presented today for B12 injection.  Administered IM in the right deltoid.  Administered without incident.  Patient tolerated well with no signs to distress.  Reviewed.   Dr Nicki Reaper

## 2018-10-16 NOTE — Telephone Encounter (Signed)
Okay to refill? This refill was requested on 10/14/18

## 2018-10-20 DIAGNOSIS — R69 Illness, unspecified: Secondary | ICD-10-CM | POA: Diagnosis not present

## 2018-10-31 ENCOUNTER — Other Ambulatory Visit: Payer: Self-pay | Admitting: Internal Medicine

## 2018-10-31 DIAGNOSIS — I10 Essential (primary) hypertension: Secondary | ICD-10-CM

## 2018-11-05 DIAGNOSIS — R69 Illness, unspecified: Secondary | ICD-10-CM | POA: Diagnosis not present

## 2018-11-10 ENCOUNTER — Ambulatory Visit: Payer: Self-pay | Admitting: Pharmacist

## 2018-11-10 NOTE — Chronic Care Management (AMB) (Signed)
  Chronic Care Management   Note  11/10/2018 Name: Beth Tyler MRN: [id number] DOB: 1945/12/06  [name] is a 73 y.o. year old female who is a primary care patient of Einar Pheasant, MD. The CCM team was consulted for assistance with chronic disease management and care coordination needs.    Received call from patient. She notes that she has tried to get in touch with [facility] to refill Basaglar, but wait times have been terrible. Contacted the pharmacist line directly, and requested they refill and expedite Basaglar. The pharmacist I spoke with noted that she would do so, and the pharmacy should outreach the patient soon to set up a delivery time. She asked that I ask the patient to answer any unknown numbers, just in case it's someone calling to set up shipping - I did so.    Follow up plan:  Will follow up with patient on Thursday to see if she has heard anything from [facility].   Catie Darnelle Maffucci, PharmD, [place] Pharmacist [facility] [place] [place] [phone]

## 2018-11-10 NOTE — Progress Notes (Signed)
Reviewed above information.  Agree with assessment and plan.    Dr Krayton Wortley 

## 2018-11-12 ENCOUNTER — Ambulatory Visit: Payer: Self-pay | Admitting: Pharmacist

## 2018-11-12 ENCOUNTER — Ambulatory Visit (INDEPENDENT_AMBULATORY_CARE_PROVIDER_SITE_OTHER): Payer: Medicare HMO | Admitting: Internal Medicine

## 2018-11-12 ENCOUNTER — Other Ambulatory Visit: Payer: Self-pay

## 2018-11-12 ENCOUNTER — Encounter: Payer: Self-pay | Admitting: Internal Medicine

## 2018-11-12 DIAGNOSIS — E785 Hyperlipidemia, unspecified: Secondary | ICD-10-CM

## 2018-11-12 DIAGNOSIS — E1140 Type 2 diabetes mellitus with diabetic neuropathy, unspecified: Secondary | ICD-10-CM

## 2018-11-12 DIAGNOSIS — R600 Localized edema: Secondary | ICD-10-CM

## 2018-11-12 DIAGNOSIS — E538 Deficiency of other specified B group vitamins: Secondary | ICD-10-CM | POA: Diagnosis not present

## 2018-11-12 DIAGNOSIS — N183 Chronic kidney disease, stage 3 unspecified: Secondary | ICD-10-CM

## 2018-11-12 DIAGNOSIS — Z794 Long term (current) use of insulin: Secondary | ICD-10-CM | POA: Diagnosis not present

## 2018-11-12 DIAGNOSIS — I700 Atherosclerosis of aorta: Secondary | ICD-10-CM | POA: Diagnosis not present

## 2018-11-12 DIAGNOSIS — K219 Gastro-esophageal reflux disease without esophagitis: Secondary | ICD-10-CM | POA: Diagnosis not present

## 2018-11-12 DIAGNOSIS — K589 Irritable bowel syndrome without diarrhea: Secondary | ICD-10-CM

## 2018-11-12 DIAGNOSIS — I10 Essential (primary) hypertension: Secondary | ICD-10-CM | POA: Diagnosis not present

## 2018-11-12 NOTE — Progress Notes (Signed)
Reviewed above information.  Agree.   Dr Nicki Reaper

## 2018-11-12 NOTE — Patient Instructions (Signed)
Visit Information  Goals Addressed            This Visit's Progress     Patient Stated   . "I can't afford my medications" (pt-stated)       Current Barriers:  . Financial concerns - patient is receiving Basaglar and [name] through [employer] patient assistance program.  . Due for refill of Basaglar, runs out of medication today. I contacted [facility] pharmacist earlier this week to provide a verbal refill and request expedited processing and shipping  Pharmacist Clinical Goal(s):  Marland Kitchen Over the next 30 days, patient will work with PharmD to address needs related to medication access  Interventions: . Received call from patient; she notes that as soon as she completed her visit with Dr. Nicki Reaper, [employer] delivery driver arrived with her Basaglar refill.  . Encouraged her to contact me when she has ~1 month supply left of either Basaglar or [name] and we will begin the refill process. She verbalized understanding   Patient Self Care Activities:  . Self administers medications as prescribed . Calls provider office for new concerns or questions  Initial goal documentation        Ms. Kernes was given information about Chronic Care Management services today including:  1. CCM service includes personalized support from designated clinical staff supervised by her physician, including individualized plan of care and coordination with other care providers 2. 24/7 contact phone numbers for assistance for urgent and routine care needs. 3. Service will only be billed when office clinical staff spend 20 minutes or more in a month to coordinate care. 4. Only one practitioner may furnish and bill the service in a calendar month. 5. The patient may stop CCM services at any time (effective at the end of the month) by phone call to the office staff. 6. The patient will be responsible for cost sharing (co-pay) of up to 20% of the service fee (after annual deductible is met).  Patient  agreed to services and verbal consent obtained.   The patient verbalized understanding of instructions provided today and declined a print copy of patient instruction materials.   Plan: - Will outreach patient in 4-5 weeks for continued medication management support  Catie Darnelle Maffucci, PharmD, [place] Pharmacist [facility] [phone]

## 2018-11-12 NOTE — Chronic Care Management (AMB) (Signed)
Chronic Care Management   Note  11/12/2018 Name: BERIT RACZKOWSKI MRN: [id number] DOB: 04-30-1945   Subjective:  Beth Tyler is a 73 y.o. year old female who is a primary care patient of Einar Pheasant, MD. The CCM team was consulted for assistance with chronic disease management and care coordination needs.    Spoke with patient telephonically for medication access concerns.    Ms. Connery was given information about Chronic Care Management services today including:  1. CCM service includes personalized support from designated clinical staff supervised by her physician, including individualized plan of care and coordination with other care providers 2. 24/7 contact phone numbers for assistance for urgent and routine care needs. 3. Service will only be billed when office clinical staff spend 20 minutes or more in a month to coordinate care. 4. Only one practitioner may furnish and bill the service in a calendar month. 5. The patient may stop CCM services at any time (effective at the end of the month) by phone call to the office staff. 6. The patient will be responsible for cost sharing (co-pay) of up to 20% of the service fee (after annual deductible is met).  Patient agreed to services and verbal consent obtained.   Review of patient status, including review of consultants reports, laboratory and other test data, was performed as part of comprehensive evaluation and provision of chronic care management services.   Objective:  Lab Results  Component Value Date   CREATININE 1.25 (H) 09/10/2018   CREATININE 1.28 (H) 05/25/2018   CREATININE 1.28 (H) 05/18/2018    Lab Results  Component Value Date   HGBA1C 7.8 (H) 09/10/2018       Component Value Date/Time   CHOL 113 09/10/2018 1023   TRIG 115.0 09/10/2018 1023   HDL 51.50 09/10/2018 1023   CHOLHDL 2 09/10/2018 1023   VLDL 23.0 09/10/2018 1023   LDLCALC 38 09/10/2018 1023   LDLDIRECT 74.4 02/21/2012 1006     Clinical ASCVD: No     BP Readings from Last 3 Encounters:  11/12/18 129/78  06/15/18 125/75  05/18/18 120/65    Allergies  Allergen Reactions  . Hydrocodone-Acetaminophen Anaphylaxis  . Percocet [Oxycodone-Acetaminophen] Anaphylaxis  . Demeclocycline Nausea Only  . Penicillins Nausea Only  . Tetracyclines & Related Nausea Only    Medications Reviewed Today    Reviewed by Einar Pheasant, MD (Physician) on 11/12/18 at 1120  Med List Status: <None>  Medication Order Taking? Sig Documenting Provider Last Dose Status Informant  amLODipine (NORVASC) 10 MG tablet [id number] No TAKE 1 TABLET BY MOUTH ONCE DAILY Crecencio Mc, MD Taking Active   Blood Glucose Monitoring Suppl (ONETOUCH VERIO FLEX SYSTEM) w/Device KIT [id number] No  [provider] Taking Active   carvedilol (COREG) 12.5 MG tablet [id number] No Take 1 tablet (12.5 mg total) by mouth 2 (two) times daily with a meal. Einar Pheasant, MD Taking Active            Med Note Erling Cruz, ANNA Angelica Ran Aug 17, 2018 11:53 AM) Dewaine Conger 1/2 of a tablet BID (6.20m BID)   fluticasone (CUTIVATE) 0.05 % cream [id number] USE AS DIRECTED SEinar Pheasant MD  Active   fluticasone furoate-vilanterol (BREO ELLIPTA) 200-25 MCG/INH AEPB [id number] Inhale 1 puff into the lungs daily. RLaverle Hobby MD Taking Active   Fluticasone-Salmeterol (ADVAIR DISKUS) 250-50 MCG/DOSE AEPB [id number] Inhale 1 puff into the lungs 2 (two) times daily. RLaverle Hobby MD Taking Active  Med Note Mayo Ao Mar 02, 2018 10:01 AM)    glucose blood [facility] VERIO) test strip [id number]  USE  STRIP TO CHECK GLUCOSE TWICE DAILY Einar Pheasant, MD  Active   hydrochlorothiazide (HYDRODIURIL) 25 MG tablet [id number] No Take 1 tablet (25 mg total) by mouth daily. Einar Pheasant, MD Taking Active   Insulin Glargine [facility]) 100 UNIT/ML SOPN [id number] No Inject 0.3 mLs (30 Units total) into the skin daily. Einar Pheasant, MD Taking Active   insulin lispro ([name]) 100 UNIT/ML injection [id number] No Inject 6-8 units with lunch and 6 units with supper daily Einar Pheasant, MD Taking Active            Med Note De Hollingshead   Mon Sep 21, 2018 11:14 AM) 6 with first meal, 8 with supper  Insulin Pen Needle 32G X 4 MM MISC [id number] No Use to inject insulin three times daily Einar Pheasant, MD Taking Active   lisinopril (ZESTRIL) 20 MG tablet [id number]  TAKE 1 TABLET BY MOUTH IN THE MORNING AND 2 AT BEDTIME Einar Pheasant, MD  Active   magnesium oxide (MAG-OX) 400 (241.3 Mg) MG tablet [id number] No TAKE 1 TABLET BY MOUTH ONCE DAILY Einar Pheasant, MD Taking Active   metFORMIN (GLUCOPHAGE-XR) 500 MG 24 hr tablet [id number] No Take 2 tablets by mouth twice daily Einar Pheasant, MD Taking Active   [facility] LANCETS 01I [place] [id number] No  [provider] Taking Active   pantoprazole (PROTONIX) 40 MG tablet [id number] No TAKE 1 TABLET BY MOUTH TWICE DAILY BEFORE MEAL(S) Einar Pheasant, MD Taking Active   pioglitazone (ACTOS) 15 MG tablet [id number] No Take 1 tablet (15 mg total) by mouth daily. Einar Pheasant, MD Taking Active   simvastatin (ZOCOR) 10 MG tablet [id number] No TAKE 1 TABLET BY MOUTH AT BEDTIME Crecencio Mc, MD Taking Active   triamcinolone cream (KENALOG) 0.1 % [id number]  APPLY CREAM EXTERNALLY TWICE DAILY Einar Pheasant, MD  Active            Assessment:   Goals Addressed            This Visit's Progress     Patient Stated   . "I can't afford my medications" (pt-stated)       Current Barriers:  . Financial concerns - patient is receiving Basaglar and [name] through [employer] patient assistance program.  . Due for refill of Basaglar, runs out of medication today. I contacted [facility] pharmacist earlier this week to provide a verbal refill and request expedited processing and shipping  Pharmacist Clinical Goal(s):  Marland Kitchen Over the next 30  days, patient will work with PharmD to address needs related to medication access  Interventions: . Received call from patient; she notes that as soon as she completed her visit with Dr. Nicki Reaper, [employer] delivery driver arrived with her Basaglar refill.  . Encouraged her to contact me when she has ~1 month supply left of either Basaglar or [name] and we will begin the refill process. She verbalized understanding   Patient Self Care Activities:  . Self administers medications as prescribed . Calls provider office for new concerns or questions  Initial goal documentation        Plan: - Will outreach patient in 4-5 weeks for continued medication management support  Catie Darnelle Maffucci, PharmD, [place] Pharmacist [facility] [phone]

## 2018-11-12 NOTE — Progress Notes (Signed)
Patient ID: Beth Tyler, female   DOB: [birth year], 73 y.o.   MRN: [id number]   Virtual Visit via video Note  This visit type was conducted due to national recommendations for restrictions regarding the COVID-19 pandemic (e.g. social distancing).  This format is felt to be most appropriate for this patient at this time.  All issues noted in this document were discussed and addressed.  No physical exam was performed (except for noted visual exam findings with Video Visits).   I connected with Beth Tyler by a video enabled telemedicine application or telephone and verified that I am speaking with the correct person using two identifiers. Location patient: home Location provider: work Persons participating in the virtual visit: patient, provider  I discussed the limitations, risks, security and privacy concerns of performing an evaluation and management service by video and the availability of in person appointments.  The patient expressed understanding and agreed to proceed.   Reason for visit: scheduled follow up.    HPI: She reports she is doing relatively well.  Trying to adjust her medication to get better control of her sugars.  States am sugars 100-101 and pm sugars 168-240.  Discussed diet and exercise.  She saw cardiology 08/18/18.  Stable.  Recommended f/u in 6 months.  Blood pressure doing well.  No chest pain.  Breathing stable.  No low sugars.  No acid reflux.  Bowels stable.  Taking protonix.  Acid reflux controlled.  Followed by nephrology for CKD.  Continue to avoid antiinflammatories.  Has lost weight.  Adjusted her diet. Weight 224 pounds today down from 235 pounds 05/2018.      ROS: See pertinent positives and negatives per HPI.  Past Medical History:  Diagnosis Date   Anemia    Arthritis    B12 deficiency    Carpal tunnel syndrome, bilateral    Diabetes mellitus ([facility]) 02/17/2012   Diabetes mellitus, type II (HCC)    Dyspnea    Gastritis    GERD  (gastroesophageal reflux disease)    Hypercholesterolemia    Hypertension    IBS (irritable bowel syndrome)    Neuropathy    legs   Pernicious anemia    Vertigo    no episodes fo 9 or 10 yrs    Past Surgical History:  Procedure Laterality Date   ANTERIOR VITRECTOMY Right 12/01/2017   Procedure: ANTERIOR VITRECTOMY;  Surgeon: Eulogio Bear, MD;  Location: [facility];  Service: Ophthalmology;  Laterality: Right;   BREAST BIOPSY Right 1980   neg   BREAST SURGERY  1980   benign cyst removal   CATARACT EXTRACTION W/PHACO Left 10/13/2017   Procedure: CATARACT EXTRACTION PHACO AND INTRAOCULAR LENS PLACEMENT ([place])  LEFT  DIABETES;  Surgeon: Eulogio Bear, MD;  Location: [facility];  Service: Ophthalmology;  Laterality: Left;  Diabetic - insulin and oral meds   CATARACT EXTRACTION W/PHACO Right 12/01/2017   Procedure: CATARACT EXTRACTION PHACO AND INTRAOCULAR LENS PLACEMENT ([facility])  RIGHT DIABETIC;  Surgeon: Eulogio Bear, MD;  Location: [facility];  Service: Ophthalmology;  Laterality: Right;  Diabetic - insulin and oral   CHOLECYSTECTOMY  1983    Family History  Problem Relation Age of Onset   Diabetes Father    Kidney disease Father    Heart disease Father    Alcohol abuse Mother    Dementia Mother    Diabetes Mother    Diabetes Other    Heart disease Son 68  CABG   Hemophilia Brother    Uterine cancer Sister    Diabetes Sister    COPD Sister    Breast cancer Maternal Aunt 74   Breast cancer Daughter 52   Stroke Daughter     SOCIAL HX: reviewed.    Current Outpatient Medications:    amLODipine (NORVASC) 10 MG tablet, TAKE 1 TABLET BY MOUTH ONCE DAILY, Disp: 90 tablet, Rfl: 1   Blood Glucose Monitoring Suppl (ONETOUCH VERIO FLEX SYSTEM) w/Device KIT, , Disp: , Rfl: 0   carvedilol (COREG) 12.5 MG tablet, Take 1 tablet (12.5 mg total) by mouth 2 (two) times daily with a meal., Disp: 180 tablet, Rfl: 1    fluticasone (CUTIVATE) 0.05 % cream, USE AS DIRECTED, Disp: 30 g, Rfl: 0   fluticasone furoate-vilanterol (BREO ELLIPTA) 200-25 MCG/INH AEPB, Inhale 1 puff into the lungs daily., Disp: 28 each, Rfl: 0   Fluticasone-Salmeterol (ADVAIR DISKUS) 250-50 MCG/DOSE AEPB, Inhale 1 puff into the lungs 2 (two) times daily., Disp: 60 each, Rfl: 3   glucose blood (ONETOUCH VERIO) test strip, USE  STRIP TO CHECK GLUCOSE TWICE DAILY, Disp: 200 each, Rfl: 0   hydrochlorothiazide (HYDRODIURIL) 25 MG tablet, Take 1 tablet (25 mg total) by mouth daily., Disp: 90 tablet, Rfl: 1   Insulin Glargine (BASAGLAR KWIKPEN) 100 UNIT/ML SOPN, Inject 0.3 mLs (30 Units total) into the skin daily., Disp: 4 pen, Rfl: 1   insulin lispro ([name]) 100 UNIT/ML injection, Inject 6-8 units with lunch and 6 units with supper daily, Disp: 3 mL, Rfl: 0   Insulin Pen Needle 32G X 4 MM MISC, Use to inject insulin three times daily, Disp: 300 each, Rfl: 3   lisinopril (ZESTRIL) 20 MG tablet, TAKE 1 TABLET BY MOUTH IN THE MORNING AND 2 AT BEDTIME, Disp: 270 tablet, Rfl: 0   magnesium oxide (MAG-OX) 400 (241.3 Mg) MG tablet, TAKE 1 TABLET BY MOUTH ONCE DAILY, Disp: 90 tablet, Rfl: 3   metFORMIN (GLUCOPHAGE-XR) 500 MG 24 hr tablet, Take 2 tablets by mouth twice daily, Disp: 360 tablet, Rfl: 1   ONETOUCH DELICA LANCETS 11S MISC, , Disp: , Rfl: 99   pantoprazole (PROTONIX) 40 MG tablet, TAKE 1 TABLET BY MOUTH TWICE DAILY BEFORE MEAL(S), Disp: 180 tablet, Rfl: 2   pioglitazone (ACTOS) 15 MG tablet, Take 1 tablet (15 mg total) by mouth daily., Disp: 90 tablet, Rfl: 1   simvastatin (ZOCOR) 10 MG tablet, TAKE 1 TABLET BY MOUTH AT BEDTIME, Disp: 90 tablet, Rfl: 1   triamcinolone cream (KENALOG) 0.1 %, APPLY CREAM EXTERNALLY TWICE DAILY, Disp: 15 g, Rfl: 0  EXAM:  VITALS per patient if applicable: 315/94, 64, 224 pounds.   GENERAL: alert, oriented, appears well and in no acute distress  HEENT: atraumatic, conjunttiva clear, no  obvious abnormalities on inspection of external nose and ears  NECK: normal movements of the head and neck  LUNGS: on inspection no signs of respiratory distress, breathing rate appears normal, no obvious gross SOB, gasping or wheezing  CV: no obvious cyanosis  PSYCH/NEURO: pleasant and cooperative, no obvious depression or anxiety, speech and thought processing grossly intact  ASSESSMENT AND PLAN:  Discussed the following assessment and plan:  Aortic atherosclerosis ([facility]) On simvastatin.    B12 deficiency Continue B12 injections.    Bilateral leg edema Has been evaluated by [facility].  Stable.    CKD (chronic kidney disease), stage III ([place]) Followed by nephrology.  Follow metabolic panel.  Avoid antiinflammatories.    Diabetes mellitus Sugars as  outlined.  She has adjusted her diet.  Lost weight.  Working with Catie to adjust medication.  Discussed diet and exercise as tolerated.  Follow met b and a1c.    GERD (gastroesophageal reflux disease) Controlled on protonix.    Hyperlipidemia On simvastatin.  Low cholesterol diet and exercise.  Follow lipid panel and liver function tests.    Hypertension Blood pressure under good control.  Continue same medication regimen.  Follow pressures.  Follow metabolic panel.    IBS (irritable bowel syndrome) Persistent issues with her bowels, but overall she feels her bowels are stable.  Declines further evaluation.      I discussed the assessment and treatment plan with the patient. The patient was provided an opportunity to ask questions and all were answered. The patient agreed with the plan and demonstrated an understanding of the instructions.   The patient was advised to call back or seek an in-person evaluation if the symptoms worsen or if the condition fails to improve as anticipated.   Einar Pheasant, MD

## 2018-11-15 ENCOUNTER — Encounter: Payer: Self-pay | Admitting: Internal Medicine

## 2018-11-15 NOTE — Assessment & Plan Note (Signed)
On simvastatin.   

## 2018-11-15 NOTE — Assessment & Plan Note (Signed)
Has been evaluated by [facility].  Stable.  

## 2018-11-15 NOTE — Assessment & Plan Note (Signed)
Continue B12 injections.   

## 2018-11-15 NOTE — Assessment & Plan Note (Signed)
Sugars as outlined.  She has adjusted her diet.  Lost weight.  Working with Catie to adjust medication.  Discussed diet and exercise as tolerated.  Follow met b and a1c.

## 2018-11-15 NOTE — Assessment & Plan Note (Signed)
Persistent issues with her bowels, but overall she feels her bowels are stable.  Declines further evaluation.

## 2018-11-15 NOTE — Assessment & Plan Note (Signed)
Blood pressure under good control.  Continue same medication regimen.  Follow pressures.  Follow metabolic panel.   

## 2018-11-15 NOTE — Assessment & Plan Note (Signed)
On simvastatin.  Low cholesterol diet and exercise.  Follow lipid panel and liver function tests.   

## 2018-11-15 NOTE — Assessment & Plan Note (Signed)
Followed by nephrology.  Follow metabolic panel.  Avoid antiinflammatories.   

## 2018-11-15 NOTE — Assessment & Plan Note (Signed)
Controlled on protonix.   

## 2018-11-17 ENCOUNTER — Other Ambulatory Visit: Payer: Self-pay

## 2018-11-17 ENCOUNTER — Ambulatory Visit (INDEPENDENT_AMBULATORY_CARE_PROVIDER_SITE_OTHER): Payer: Medicare HMO

## 2018-11-17 DIAGNOSIS — E538 Deficiency of other specified B group vitamins: Secondary | ICD-10-CM | POA: Diagnosis not present

## 2018-11-17 MED ORDER — CYANOCOBALAMIN 1000 MCG/ML IJ SOLN
1000.0000 ug | Freq: Once | INTRAMUSCULAR | Status: AC
  Administered 2018-11-17: 11:00:00 1000 ug via INTRAMUSCULAR

## 2018-11-17 NOTE — Progress Notes (Addendum)
Patient presented today for B12 injection.  Administered IM in the left deltoid.  Patient tolerated well with no signs of distress.  Reviewed.  Dr Scott  

## 2018-12-15 ENCOUNTER — Other Ambulatory Visit: Payer: Self-pay | Admitting: Internal Medicine

## 2018-12-15 DIAGNOSIS — I10 Essential (primary) hypertension: Secondary | ICD-10-CM

## 2018-12-17 ENCOUNTER — Ambulatory Visit: Payer: Medicare HMO | Admitting: Pharmacist

## 2018-12-17 DIAGNOSIS — E1140 Type 2 diabetes mellitus with diabetic neuropathy, unspecified: Secondary | ICD-10-CM

## 2018-12-17 DIAGNOSIS — I10 Essential (primary) hypertension: Secondary | ICD-10-CM

## 2018-12-17 DIAGNOSIS — N183 Chronic kidney disease, stage 3 (moderate): Secondary | ICD-10-CM | POA: Diagnosis not present

## 2018-12-17 DIAGNOSIS — E1122 Type 2 diabetes mellitus with diabetic chronic kidney disease: Secondary | ICD-10-CM | POA: Diagnosis not present

## 2018-12-17 MED ORDER — CARVEDILOL 6.25 MG PO TABS: 6.2500 mg | ORAL_TABLET | Freq: Two times a day (BID) | ORAL | 1 refills | Status: DC

## 2018-12-17 MED ORDER — LISINOPRIL 20 MG PO TABS: 20.0000 mg | ORAL_TABLET | Freq: Two times a day (BID) | ORAL | 1 refills | Status: DC

## 2018-12-17 NOTE — Patient Instructions (Signed)
Visit Information  Goals Addressed            This Visit's Progress     Patient Stated   . "I can't afford my medications" (pt-stated)       Current Barriers:  . Diabetes: uncontrolled though improved per BG; most recent A1c 7.8%,  . Current antihyperglycemic regimen: Basaglar 30 units daily, [name] 6 units with lunch, 8 units with supper,  o GLP1 not a option d/t significant IBS. Renal function low for SGLT2 to provide glycemic benefit. . Reports 2 episodes of overnight hypoglycemia in the 50s; notes that 1 episode was after she had a salad for dinner; she is unsure what her bedtime glucose readings were that day . Current exercise: playing with her grandchildren and great grandchildren . Current blood glucose readings: All fastings <120s; post supper average 188, range 162-224 over the past 3 weeks . Cardiovascular risk reduction: o Current hypertensive regimen: amlodipine 10 mg QPM, carvedilol 6.25 mg BID, HCTZ 25 mg QAM, lisinopril 20 mg BID; BP at goal <130/80 on last check o Current hyperlipidemia regimen: simvastatin 10 mg daily; LDL at goal <70 on last check  Pharmacist Clinical Goal(s):  Marland Kitchen Over the next 90 days, patient with work with PharmD and primary care provider to address optimized glycemic management  Interventions: . Comprehensive medication review performed, medication list updated in electronic medical record . Congratulated patient on continued improvement in glucose results. Praised for focus on documenting glucose readings BID . Encouraged to adjust supper [name] dose as needed, based on anticipated carb content of meals. If low carb options (salads), use 6-7 units; if higher carb options, can use up to 10 units. Adjust based on results with target post prandial glucose of <180. Patient verbalizes understanding.  . Patient had questions about metformin XR recall. Educated on recall, and that pharmacies are only dispensing safe lots at this time . Will update  lisinopril and carvedilol prescriptions to prevent patient appearing nonadherent  Patient Self Care Activities:  . Patient will check blood glucose BID , document, and provide at future appointments . Patient will take medications as prescribed . Patient will report any questions or concerns to provider   Initial goal documentation        The patient verbalized understanding of instructions provided today and declined a print copy of patient instruction materials.   Plan:  - Scheduled follow up phone call in 4 weeks to for medication management support  Catie Darnelle Maffucci, PharmD, [place] Pharmacist [place] [phone]

## 2018-12-17 NOTE — Chronic Care Management (AMB) (Signed)
Chronic Care Management   Follow Up Note   12/17/2018 Name: Beth Tyler MRN: [id number] DOB: 06/01/1945  Referred by: Einar Pheasant, MD Reason for referral : Chronic Care Management (Medication Management)   Beth Tyler is a 73 y.o. year old female who is a primary care patient of Einar Pheasant, MD. The CCM team was consulted for assistance with chronic disease management and care coordination needs.    Contacted patient for medication management review today.   Review of patient status, including review of consultants reports, relevant laboratory and other test results, and collaboration with appropriate care team members and the patient's provider was performed as part of comprehensive patient evaluation and provision of chronic care management services.    SDOH (Social Determinants of Health) screening performed today: Financial Strain  Physical Activity. See Care Plan for related entries.   Outpatient Encounter Medications as of 12/17/2018  Medication Sig Note  . acetaminophen (TYLENOL) 500 MG tablet Take 500 mg by mouth every 6 (six) hours as needed.   Marland Kitchen amLODipine (NORVASC) 10 MG tablet Take 1 tablet by mouth once daily 12/17/2018: PM  . Blood Glucose Monitoring Suppl (ONETOUCH VERIO FLEX SYSTEM) w/Device KIT    . carvedilol (COREG) 12.5 MG tablet Take 1 tablet (12.5 mg total) by mouth 2 (two) times daily with a meal. 08/17/2018: Takes 1/2 of a tablet BID (6.14m BID)   . fluticasone (CUTIVATE) 0.05 % cream USE AS DIRECTED   . glucose blood (ONETOUCH VERIO) test strip USE  STRIP TO CHECK GLUCOSE TWICE DAILY   . hydrochlorothiazide (HYDRODIURIL) 25 MG tablet Take 1 tablet (25 mg total) by mouth daily. 12/17/2018: AM  . Insulin Glargine (BASAGLAR KWIKPEN) 100 UNIT/ML SOPN Inject 0.3 mLs (30 Units total) into the skin daily.   . insulin lispro ([name]) 100 UNIT/ML injection Inject 6-8 units with lunch and 6 units with supper daily 09/21/2018: 6 with first meal, 8 with supper   . Insulin Pen Needle 32G X 4 MM MISC Use to inject insulin three times daily   . lisinopril (ZESTRIL) 20 MG tablet TAKE 1 TABLET BY MOUTH IN THE MORNING AND 2 AT BEDTIME (Patient taking differently: 20 mg 2 (two) times daily. )   . magnesium oxide (MAG-OX) 400 (241.3 Mg) MG tablet Take 1 tablet by mouth once daily   . metFORMIN (GLUCOPHAGE-XR) 500 MG 24 hr tablet Take 2 tablets by mouth twice daily   . ONETOUCH DELICA LANCETS 335TMISC    . pantoprazole (PROTONIX) 40 MG tablet TAKE 1 TABLET BY MOUTH TWICE DAILY BEFORE MEAL(S)   . pioglitazone (ACTOS) 15 MG tablet Take 1 tablet (15 mg total) by mouth daily.   . simvastatin (ZOCOR) 10 MG tablet TAKE 1 TABLET BY MOUTH AT BEDTIME   . triamcinolone cream (KENALOG) 0.1 % APPLY CREAM EXTERNALLY TWICE DAILY   . fluticasone furoate-vilanterol (BREO ELLIPTA) 200-25 MCG/INH AEPB Inhale 1 puff into the lungs daily. (Patient not taking: Reported on 12/17/2018)   . Fluticasone-Salmeterol (ADVAIR DISKUS) 250-50 MCG/DOSE AEPB Inhale 1 puff into the lungs 2 (two) times daily. (Patient not taking: Reported on 12/17/2018)    No facility-administered encounter medications on file as of 12/17/2018.      Goals Addressed            This Visit's Progress     Patient Stated   . "I can't afford my medications" (pt-stated)       Current Barriers:  . Diabetes: uncontrolled though improved per BG; most recent  A1c 7.8%,  . Current antihyperglycemic regimen: Basaglar 30 units daily, [name] 6 units with lunch, 8 units with supper,  o GLP1 not a option d/t significant IBS. Renal function low for SGLT2 to provide glycemic benefit. . Reports 2 episodes of overnight hypoglycemia in the 50s; notes that 1 episode was after she had a salad for dinner; she is unsure what her bedtime glucose readings were that day . Current exercise: playing with her grandchildren and great grandchildren . Current blood glucose readings: All fastings <120s; post supper average 188, range  162-224 over the past 3 weeks . Cardiovascular risk reduction: o Current hypertensive regimen: amlodipine 10 mg QPM, carvedilol 6.25 mg BID, HCTZ 25 mg QAM, lisinopril 20 mg BID; BP at goal <130/80 on last check o Current hyperlipidemia regimen: simvastatin 10 mg daily; LDL at goal <70 on last check  Pharmacist Clinical Goal(s):  Marland Kitchen Over the next 90 days, patient with work with PharmD and primary care provider to address optimized glycemic management  Interventions: . Comprehensive medication review performed, medication list updated in electronic medical record . Congratulated patient on continued improvement in glucose results. Praised for focus on documenting glucose readings BID . Encouraged to adjust supper [name] dose as needed, based on anticipated carb content of meals. If low carb options (salads), use 6-7 units; if higher carb options, can use up to 10 units. Adjust based on results with target post prandial glucose of <180. Patient verbalizes understanding.  . Patient had questions about metformin XR recall. Educated on recall, and that pharmacies are only dispensing safe lots at this time . Will update lisinopril and carvedilol prescriptions to prevent patient appearing nonadherent  Patient Self Care Activities:  . Patient will check blood glucose BID , document, and provide at future appointments . Patient will take medications as prescribed . Patient will report any questions or concerns to provider   Initial goal documentation         Plan:  - Scheduled follow up phone call in 4 weeks to for medication management support  Catie Darnelle Maffucci, PharmD, [place] Pharmacist [place] [phone]

## 2018-12-17 NOTE — Progress Notes (Signed)
Reviewed information.  Agree with assessment and plan.    Dr Caytlin Better 

## 2018-12-21 ENCOUNTER — Ambulatory Visit: Payer: Medicare HMO

## 2018-12-21 ENCOUNTER — Other Ambulatory Visit: Payer: Self-pay

## 2018-12-21 DIAGNOSIS — E538 Deficiency of other specified B group vitamins: Secondary | ICD-10-CM

## 2018-12-21 DIAGNOSIS — Z23 Encounter for immunization: Secondary | ICD-10-CM

## 2018-12-21 MED ORDER — CYANOCOBALAMIN 1000 MCG/ML IJ SOLN
1000.0000 ug | Freq: Once | INTRAMUSCULAR | Status: AC
  Administered 2018-12-21: 1000 ug via INTRAMUSCULAR

## 2018-12-21 NOTE — Progress Notes (Unsigned)
Patient presented today for B12 injection.  Administered IM in right deltoid.    Patient given flu shot in left deltoid.  Patient tolerated well with no signs of distress.

## 2019-01-17 ENCOUNTER — Other Ambulatory Visit: Payer: Self-pay | Admitting: Internal Medicine

## 2019-01-18 DIAGNOSIS — R69 Illness, unspecified: Secondary | ICD-10-CM | POA: Diagnosis not present

## 2019-01-21 ENCOUNTER — Ambulatory Visit (INDEPENDENT_AMBULATORY_CARE_PROVIDER_SITE_OTHER): Payer: Medicare HMO | Admitting: Pharmacist

## 2019-01-21 DIAGNOSIS — E1140 Type 2 diabetes mellitus with diabetic neuropathy, unspecified: Secondary | ICD-10-CM | POA: Diagnosis not present

## 2019-01-21 DIAGNOSIS — Z794 Long term (current) use of insulin: Secondary | ICD-10-CM | POA: Diagnosis not present

## 2019-01-21 MED ORDER — INSULIN LISPRO 100 UNIT/ML ~~LOC~~ SOLN: SUBCUTANEOUS | 0 refills | Status: DC

## 2019-01-21 NOTE — Patient Instructions (Signed)
Visit Information  Goals Addressed            This Visit's Progress     Patient Stated   . "I can't afford my medications" (pt-stated)       Current Barriers:  . Diabetes: uncontrolled though improved per BG; most recent A1c 7.8%, due, but has labwork and appointment scheduled w/ PCP in December  . Current antihyperglycemic regimen: Pioglitazone 15 mg daily, Basaglar 30 units daily, [name] 4 with breakfast sometimes (rarely); 6 units with lunch, 8 units with supper  o GLP1 not a option d/t significant IBS. Renal function low for SGLT2 to provide glycemic benefit. . Denies any episodes of hypoglycemia since our last phone call o Has shifted towards eating salads for lunch to prevent hypoglycemia overnight  . Current exercise: playing with her grandchildren and great grandchildren . Current blood glucose readings: All fastings <120s; post supper average ~160s-210s;  . Cardiovascular risk reduction: o Current hypertensive regimen: amlodipine 10 mg QPM, carvedilol 6.25 mg BID, HCTZ 25 mg QAM, lisinopril 20 mg BID; BP at goal <130/80 on last check o Current hyperlipidemia regimen: simvastatin 10 mg daily; LDL at goal <70 on last check  Pharmacist Clinical Goal(s):  Marland Kitchen Over the next 90 days, patient with work with PharmD and primary care provider to address optimized glycemic management  Interventions: . Comprehensive medication review performed, medication list updated in electronic medical record . Congratulated patient on continued maintenance of at goal fasting glucose  . Discussed that average post-supper reading is not at goal. Increase [name] to 9-10 units with supper, continue 4 units with breakfast and 6 units with lunch. Continue Basaglar 30 units daily. Patient understands to titrate mealtime insulin to goal <180 . Discussed increased risk of fracture w/ pioglitazone. Reviewed last DEXA results from 2017; was normal. Worth considering discontinuation of pioglitazone. Will discuss  w/ Dr. Nicki Reaper, and consider this moving forward, with titration of insulin as needed to maintain goal blood sugars.  . Patient noted that she needs a refill on [name]. Contacted [facility]; left voicemail with refill.   Patient Self Care Activities:  . Patient will check blood glucose BID , document, and provide at future appointments . Patient will take medications as prescribed . Patient will report any questions or concerns to provider   Please see past updates related to this goal by clicking on the "Past Updates" button in the selected goal         The patient verbalized understanding of instructions provided today and declined a print copy of patient instruction materials.   Plan:  - Scheduled follow up phone call in ~4 weeks  Catie Darnelle Maffucci, PharmD, [place] Pharmacist [place] [phone]

## 2019-01-21 NOTE — Addendum Note (Signed)
Addended by: De Hollingshead on: 01/21/2019 11:29 AM   Modules accepted: Orders

## 2019-01-21 NOTE — Chronic Care Management (AMB) (Signed)
Chronic Care Management   Follow Up Note   01/21/2019 Name: Beth Tyler MRN: [id number] DOB: 1945-08-09  Referred by: Einar Pheasant, MD Reason for referral : Chronic Care Management (Medication Management)   Beth Tyler is a 73 y.o. year old female who is a primary care patient of Einar Pheasant, MD. The CCM team was consulted for assistance with chronic disease management and care coordination needs.    Contacted patient for medication management review.   Review of patient status, including review of consultants reports, relevant laboratory and other test results, and collaboration with appropriate care team members and the patient's provider was performed as part of comprehensive patient evaluation and provision of chronic care management services.    SDOH (Social Determinants of Health) screening performed today: Financial Strain . See Care Plan for related entries.   Advanced Directives Status: N See Care Plan and Vynca application for related entries.  Outpatient Encounter Medications as of 01/21/2019  Medication Sig Note  . pioglitazone (ACTOS) 15 MG tablet Take 1 tablet (15 mg total) by mouth daily.   Marland Kitchen acetaminophen (TYLENOL) 500 MG tablet Take 500 mg by mouth every 6 (six) hours as needed.   Marland Kitchen amLODipine (NORVASC) 10 MG tablet Take 1 tablet by mouth once daily 12/17/2018: PM  . Blood Glucose Monitoring Suppl (ONETOUCH VERIO FLEX SYSTEM) w/Device KIT    . carvedilol (COREG) 6.25 MG tablet Take 1 tablet (6.25 mg total) by mouth 2 (two) times daily with a meal.   . fluticasone (CUTIVATE) 0.05 % cream USE AS DIRECTED   . fluticasone furoate-vilanterol (BREO ELLIPTA) 200-25 MCG/INH AEPB Inhale 1 puff into the lungs daily. (Patient not taking: Reported on 12/17/2018)   . Fluticasone-Salmeterol (ADVAIR DISKUS) 250-50 MCG/DOSE AEPB Inhale 1 puff into the lungs 2 (two) times daily. (Patient not taking: Reported on 12/17/2018)   . hydrochlorothiazide (HYDRODIURIL) 25 MG  tablet Take 1 tablet (25 mg total) by mouth daily. 12/17/2018: AM  . Insulin Glargine (BASAGLAR KWIKPEN) 100 UNIT/ML SOPN Inject 0.3 mLs (30 Units total) into the skin daily.   . insulin lispro ([name]) 100 UNIT/ML injection Inject 6-8 units with lunch and 6 units with supper daily 09/21/2018: 6 with first meal, 8 with supper  . Insulin Pen Needle 32G X 4 MM MISC Use to inject insulin three times daily   . lisinopril (ZESTRIL) 20 MG tablet Take 1 tablet (20 mg total) by mouth 2 (two) times daily.   . magnesium oxide (MAG-OX) 400 (241.3 Mg) MG tablet Take 1 tablet by mouth once daily   . metFORMIN (GLUCOPHAGE-XR) 500 MG 24 hr tablet Take 2 tablets by mouth twice daily   . ONETOUCH DELICA LANCETS 72Z MISC    . ONETOUCH VERIO test strip USE 1 STRIP TO CHECK GLUCOSE TWICE DAILY   . pantoprazole (PROTONIX) 40 MG tablet TAKE 1 TABLET BY MOUTH TWICE DAILY BEFORE MEAL(S)   . simvastatin (ZOCOR) 10 MG tablet TAKE 1 TABLET BY MOUTH AT BEDTIME   . triamcinolone cream (KENALOG) 0.1 % APPLY CREAM EXTERNALLY TWICE DAILY    No facility-administered encounter medications on file as of 01/21/2019.      Goals Addressed            This Visit's Progress     Patient Stated   . "I can't afford my medications" (pt-stated)       Current Barriers:  . Diabetes: uncontrolled though improved per BG; most recent A1c 7.8%, due, but has labwork and appointment scheduled w/  PCP in December  . Current antihyperglycemic regimen: Pioglitazone 15 mg daily, Basaglar 30 units daily, [name] 4 with breakfast sometimes (rarely); 6 units with lunch, 8 units with supper  o GLP1 not a option d/t significant IBS. Renal function low for SGLT2 to provide glycemic benefit. . Denies any episodes of hypoglycemia since our last phone call o Has shifted towards eating salads for lunch to prevent hypoglycemia overnight  . Current exercise: playing with her grandchildren and great grandchildren . Current blood glucose readings: All  fastings <120s; post supper average ~160s-210s;  . Cardiovascular risk reduction: o Current hypertensive regimen: amlodipine 10 mg QPM, carvedilol 6.25 mg BID, HCTZ 25 mg QAM, lisinopril 20 mg BID; BP at goal <130/80 on last check o Current hyperlipidemia regimen: simvastatin 10 mg daily; LDL at goal <70 on last check  Pharmacist Clinical Goal(s):  Marland Kitchen Over the next 90 days, patient with work with PharmD and primary care provider to address optimized glycemic management  Interventions: . Comprehensive medication review performed, medication list updated in electronic medical record . Congratulated patient on continued maintenance of at goal fasting glucose  . Discussed that average post-supper reading is not at goal. Increase [name] to 9-10 units with supper, continue 4 units with breakfast and 6 units with lunch. Continue Basaglar 30 units daily. Patient understands to titrate mealtime insulin to goal <180 . Discussed increased risk of fracture w/ pioglitazone. Reviewed last DEXA results from 2017; was normal. Worth considering discontinuation of pioglitazone. Will discuss w/ Dr. Nicki Reaper, and consider this moving forward, with titration of insulin as needed to maintain goal blood sugars.  . Patient noted that she needs a refill on [name]. Contacted [facility]; left voicemail with refill.   Patient Self Care Activities:  . Patient will check blood glucose BID , document, and provide at future appointments . Patient will take medications as prescribed . Patient will report any questions or concerns to provider   Please see past updates related to this goal by clicking on the "Past Updates" button in the selected goal          Plan:  - Scheduled follow up phone call in ~4 weeks  Catie Darnelle Maffucci, PharmD, [place] Pharmacist [place] [phone]

## 2019-01-21 NOTE — Progress Notes (Signed)
Reviewed Beth Tyler note.  Agree with changing actos if pt agreeable.  Follow.    Dr Nicki Reaper

## 2019-01-25 ENCOUNTER — Ambulatory Visit (INDEPENDENT_AMBULATORY_CARE_PROVIDER_SITE_OTHER): Payer: Medicare HMO | Admitting: *Deleted

## 2019-01-25 ENCOUNTER — Other Ambulatory Visit: Payer: Self-pay

## 2019-01-25 DIAGNOSIS — E538 Deficiency of other specified B group vitamins: Secondary | ICD-10-CM

## 2019-01-25 MED ORDER — CYANOCOBALAMIN 1000 MCG/ML IJ SOLN
1000.0000 ug | Freq: Once | INTRAMUSCULAR | Status: AC
  Administered 2019-01-25: 1000 ug via INTRAMUSCULAR

## 2019-01-25 NOTE — Progress Notes (Addendum)
Patient presented for B 12 injection to left deltoid, patient voiced no concerns nor showed any signs of distress during injection.  Reviewed.  Dr Scott 

## 2019-01-27 ENCOUNTER — Ambulatory Visit (INDEPENDENT_AMBULATORY_CARE_PROVIDER_SITE_OTHER): Payer: Medicare HMO

## 2019-01-27 ENCOUNTER — Ambulatory Visit: Payer: Medicare HMO | Admitting: Internal Medicine

## 2019-01-27 ENCOUNTER — Other Ambulatory Visit: Payer: Self-pay

## 2019-01-27 DIAGNOSIS — Z0000 Encounter for general adult medical examination without abnormal findings: Secondary | ICD-10-CM | POA: Diagnosis not present

## 2019-01-27 NOTE — Progress Notes (Addendum)
Subjective:   Beth Tyler is a 73 y.o. female who presents for Medicare Annual (Subsequent) preventive examination.  Review of Systems:  No ROS.  Medicare Wellness Virtual Visit.  Visual/audio telehealth visit, UTA vital signs.   See social history for additional risk factors.   Cardiac Risk Factors include: advanced age (>58mn, >>67women);diabetes mellitus     Objective:     Vitals: There were no vitals taken for this visit.  There is no height or weight on file to calculate BMI.  Advanced Directives 01/27/2019 12/09/2017 12/01/2017 10/13/2017 09/06/2016 05/21/2016 04/05/2016  Does Patient Have a Medical Advance Directive? No No No No Yes Yes Yes  Type of Advance Directive - - - - HPress photographerLiving will - HClintonLiving will  Does patient want to make changes to medical advance directive? - No - Patient declined - No - Patient declined - - No - Patient declined  Copy of [place] Chart? - - - - - - No - copy requested  Would patient like information on creating a medical advance directive? No - Patient declined - No - Patient declined No - Patient declined - - -    Tobacco Social History   Tobacco Use  Smoking Status Never Smoker  Smokeless Tobacco Never Used     Counseling given: Not Answered   Clinical Intake:  Pre-visit preparation completed: Yes        Diabetes: Yes(Followed by pcp)  How often do you need to have someone help you when you read instructions, pamphlets, or other written materials from your doctor or pharmacy?: 1 - Never  Interpreter Needed?: No     Past Medical History:  Diagnosis Date   Anemia    Arthritis    B12 deficiency    Carpal tunnel syndrome, bilateral    Diabetes mellitus ([place] 02/17/2012   Diabetes mellitus, type II ([place]    Dyspnea    Gastritis    GERD (gastroesophageal reflux disease)    Hypercholesterolemia    Hypertension    IBS (irritable bowel  syndrome)    Neuropathy    legs   Pernicious anemia    Vertigo    no episodes fo 9 or 10 yrs   Past Surgical History:  Procedure Laterality Date   ANTERIOR VITRECTOMY Right 12/01/2017   Procedure: ANTERIOR VITRECTOMY;  Surgeon: KEulogio Bear MD;  [place] [facility]  Service: Ophthalmology;  Laterality: Right;   BREAST BIOPSY Right 1980   neg   BREAST SURGERY  1980   benign cyst removal   CATARACT EXTRACTION W/PHACO Left 10/13/2017   Procedure: CATARACT EXTRACTION PHACO AND INTRAOCULAR LENS PLACEMENT ([place]  LEFT  DIABETES;  Surgeon: KEulogio Bear MD;  [place]  Service: Ophthalmology;  Laterality: Left;  Diabetic - insulin and oral meds   CATARACT EXTRACTION W/PHACO Right 12/01/2017   Procedure: CATARACT EXTRACTION PHACO AND INTRAOCULAR LENS PLACEMENT ([place]  RIGHT DIABETIC;  Surgeon: KEulogio Bear MD;  [place] [facility]  Service: Ophthalmology;  Laterality: Right;  Diabetic - insulin and oral   CHOLECYSTECTOMY  1983   Family History  Problem Relation Age of Onset   Diabetes Father    Kidney disease Father    Heart disease Father    Alcohol abuse Mother    Dementia Mother    Diabetes Mother    Diabetes Other    Heart disease Son 511  CABG   Hemophilia Brother    Uterine cancer Sister    Diabetes Sister    COPD Sister    Breast cancer Maternal Aunt 53   Breast cancer Daughter 56   Stroke Daughter    Social History   Socioeconomic History   Marital status: Widowed    Spouse name: Not on file   Number of children: 4   Years of education: Not on file   Highest education level: Not on file  Occupational History   Not on file  Social Needs   Financial resource strain: Not hard at all   Food insecurity    Worry: Never true    Inability: Never true   Transportation needs    Medical: No    Non-medical: No  Tobacco Use   Smoking status: Never Smoker   Smokeless tobacco:  Never Used  Substance and Sexual Activity   Alcohol use: No    Alcohol/week: 0.0 standard drinks   Drug use: No   Sexual activity: Not Currently  Lifestyle   Physical activity    Days per week: Not on file    Minutes per session: Not on file   Stress: Not at all  Relationships   Social connections    Talks on phone: More than three times a week    Gets together: More than three times a week    Attends religious service: 1 to 4 times per year    Active member of club or organization: Yes    Attends meetings of clubs or organizations: 1 to 4 times per year    Relationship status: Widowed  Other Topics Concern   Not on file  Social History Narrative   Widowed. Has four children. Manager of an apartment building. No tobacco, alcohol or other drug use    Outpatient Encounter Medications as of 01/27/2019  Medication Sig   acetaminophen (TYLENOL) 500 MG tablet Take 500 mg by mouth every 6 (six) hours as needed.   amLODipine (NORVASC) 10 MG tablet Take 1 tablet by mouth once daily   Blood Glucose Monitoring Suppl (ONETOUCH VERIO FLEX SYSTEM) w/Device KIT    carvedilol (COREG) 6.25 MG tablet Take 1 tablet (6.25 mg total) by mouth 2 (two) times daily with a meal.   fluticasone (CUTIVATE) 0.05 % cream USE AS DIRECTED   fluticasone furoate-vilanterol (BREO ELLIPTA) 200-25 MCG/INH AEPB Inhale 1 puff into the lungs daily.   Fluticasone-Salmeterol (ADVAIR DISKUS) 250-50 MCG/DOSE AEPB Inhale 1 puff into the lungs 2 (two) times daily.   hydrochlorothiazide (HYDRODIURIL) 25 MG tablet Take 1 tablet (25 mg total) by mouth daily.   Insulin Glargine (BASAGLAR KWIKPEN) 100 UNIT/ML SOPN Inject 0.3 mLs (30 Units total) into the skin daily.   insulin lispro ([name]) 100 UNIT/ML injection Inject up to 10 units TID with meals   Insulin Pen Needle 32G X 4 MM MISC Use to inject insulin three times daily   lisinopril (ZESTRIL) 20 MG tablet Take 1 tablet (20 mg total) by mouth 2 (two) times  daily.   magnesium oxide (MAG-OX) 400 (241.3 Mg) MG tablet Take 1 tablet by mouth once daily   metFORMIN (GLUCOPHAGE-XR) 500 MG 24 hr tablet Take 2 tablets by mouth twice daily   ONETOUCH DELICA LANCETS 65Y MISC    ONETOUCH VERIO test strip USE 1 STRIP TO CHECK GLUCOSE TWICE DAILY   pantoprazole (PROTONIX) 40 MG tablet TAKE 1 TABLET BY MOUTH TWICE DAILY BEFORE MEAL(S)   pioglitazone (ACTOS) 15 MG tablet Take  1 tablet (15 mg total) by mouth daily.   simvastatin (ZOCOR) 10 MG tablet TAKE 1 TABLET BY MOUTH AT BEDTIME   triamcinolone cream (KENALOG) 0.1 % APPLY CREAM EXTERNALLY TWICE DAILY   No facility-administered encounter medications on file as of 01/27/2019.     Activities of Daily Living In your present state of health, do you have any difficulty performing the following activities: 01/27/2019  Hearing? N  Vision? N  Difficulty concentrating or making decisions? N  Walking or climbing stairs? N  Comment Unsteady gait, walker in use  Dressing or bathing? N  Doing errands, shopping? N  Preparing Food and eating ? N  Using the Toilet? N  In the past six months, have you accidently leaked urine? N  Do you have problems with loss of bowel control? N  Managing your Medications? N  Managing your Finances? N  Housekeeping or managing your Housekeeping? N  Some recent data might be hidden    Patient Care Team: Einar Pheasant, MD as PCP - General (Internal Medicine) De Hollingshead, [facility] as Pharmacist (Pharmacist)    Assessment:   This is a routine wellness examination for Beth Tyler.  I connected with patient 01/27/19 at 10:00 AM EDT by an audio enabled telemedicine application and verified that I am speaking with the correct person using two identifiers. Patient stated full name and DOB. Patient gave permission to continue with virtual visit. Patient's location was at home and Nurse's location was at [place] office.   Health Maintenance Due: -Tdap- discussed; to be  completed with doctor in visit or local pharmacy.   -Eye Exam- scheduled 02/02/19 -Foot Exam- followed by pcp -Hgb A1c- 09/10/18 (7.8) Update all pending maintenance due as appropriate.   See completed HM at the end of note.   Eye: Visual acuity not assessed. Virtual visit. Wears corrective lenses. Followed by their ophthalmologist every 12 months.  Retinopathy- none reported  Dental: Visits every 6 months.    Hearing: Demonstrates normal hearing during visit.   Safety:  Patient feels safe at home- yes Patient does have smoke detectors at home- yes Patient does wear sunscreen or protective clothing when in direct sunlight - yes Patient does wear seat belt when in a moving vehicle - yes Patient drives- yes Adequate lighting in walkways free from debris- yes Grab bars and handrails used as appropriate- yes Ambulates with a walker/cane as an assistive device Cell phone or lifeline/life alert/medic alert on person when ambulating outside of the home- yes  Social: Alcohol intake - no      Smoking history- never   Smokers in home? none Illicit drug use? none  Depression: PHQ 2 &9 complete. See screening below. Denies irritability, anhedonia, sadness/tearfullness.  Stable.   Falls: See screening below.    Medication: Taking as directed and without issues.   Covid-19: Precautions and sickness symptoms discussed. Wears mask, social distancing, hand hygiene as appropriate.   Activities of Daily Living Patient denies needing assistance with: household chores, feeding themselves, getting from bed to chair, getting to the toilet, bathing/showering, dressing, managing money, or preparing meals.   Memory: Patient is alert. Patient denies difficulty focusing or concentrating. Correctly identified the president of the [place], season and recall. Patient likes to read, sewing, painting, quilting and crafts for brain stimulation.  BMI- discussed the importance of a healthy diet, water  intake and the benefits of aerobic exercise.  Educational material provided.  Physical activity- stair climbing, chair exercises  Diet: healthy Water: good intake Ensure/Protein  supplement:   Other Providers Patient Care Team: Einar Pheasant, MD as PCP - General (Internal Medicine) De Hollingshead, [facility] as Pharmacist (Pharmacist)  Exercise Activities and Dietary recommendations Current Exercise Habits: Home exercise routine, Time (Minutes): 15, Frequency (Times/Week): 3, Weekly Exercise (Minutes/Week): 45, Intensity: Mild  Goals      Patient Stated    "I can't afford my medications" (pt-stated)     Current Barriers:   Diabetes: uncontrolled though improved per BG; most recent A1c 7.8%, due, but has labwork and appointment scheduled w/ PCP in December   Current antihyperglycemic regimen: Pioglitazone 15 mg daily, Basaglar 30 units daily, [name] 4 with breakfast sometimes (rarely); 6 units with lunch, 8 units with supper  o GLP1 not a option d/t significant IBS. Renal function low for SGLT2 to provide glycemic benefit.  Denies any episodes of hypoglycemia since our last phone call o Has shifted towards eating salads for lunch to prevent hypoglycemia overnight   Current exercise: playing with her grandchildren and great grandchildren  Current blood glucose readings: All fastings <120s; post supper average ~160s-210s;   Cardiovascular risk reduction: o Current hypertensive regimen: amlodipine 10 mg QPM, carvedilol 6.25 mg BID, HCTZ 25 mg QAM, lisinopril 20 mg BID; BP at goal <130/80 on last check o Current hyperlipidemia regimen: simvastatin 10 mg daily; LDL at goal <70 on last check  Pharmacist Clinical Goal(s):   Over the next 90 days, patient with work with PharmD and primary care provider to address optimized glycemic management  Interventions:  Comprehensive medication review performed, medication list updated in electronic medical record  Congratulated patient on  continued maintenance of at goal fasting glucose   Discussed that average post-supper reading is not at goal. Increase [name] to 9-10 units with supper, continue 4 units with breakfast and 6 units with lunch. Continue Basaglar 30 units daily. Patient understands to titrate mealtime insulin to goal <180  Discussed increased risk of fracture w/ pioglitazone. Reviewed last DEXA results from 2017; was normal. Worth considering discontinuation of pioglitazone. Will discuss w/ Dr. Nicki Reaper, and consider this moving forward, with titration of insulin as needed to maintain goal blood sugars.   Patient noted that she needs a refill on [name]. Contacted [facility]; left voicemail with refill.   Patient Self Care Activities:   Patient will check blood glucose BID , document, and provide at future appointments  Patient will take medications as prescribed  Patient will report any questions or concerns to provider   Please see past updates related to this goal by clicking on the "Past Updates" button in the selected goal         Fall Risk Fall Risk  01/27/2019 12/09/2017 12/17/2016 04/05/2016 02/23/2016  Falls in the past year? 0 No Yes No No  Number falls in past yr: 0 - 2 or more - -  Injury with Fall? - - Yes - -  Follow up - - - - -   Timed Get Up and Go performed: no, virtual visit  Depression Screen PHQ 2/9 Scores 01/27/2019 12/09/2017 12/17/2016 04/05/2016  PHQ - 2 Score 0 0 0 0  PHQ- 9 Score - - 0 -     Cognitive Function MMSE - Mini Mental State Exam 12/09/2017 04/05/2016 04/06/2015  Orientation to time 5 5 5   Orientation to Place 5 5 5   Registration 3 3 3   Attention/ Calculation 5 5 5   Recall 3 3 3   Language- name 2 objects 2 2 2   Language- repeat 1 1 1  Language- follow 3 step command 3 3 3   Language- read & follow direction 1 1 1   Write a sentence 1 1 1   Copy design 1 1 1   Total score 30 30 30      6CIT Screen 01/27/2019  What Year? 0 points  What month? 0 points  What  time? 0 points  Count back from 20 0 points  Months in reverse 0 points  Repeat phrase 0 points  Total Score 0    Immunization History  Administered Date(s) Administered   Fluad Quad(high Dose 65+) 12/21/2018   Influenza Split 02/04/2012   Influenza, High Dose Seasonal PF 12/19/2015, 12/17/2016, 01/08/2018   Influenza,inj,Quad PF,6+ Mos 01/22/2013, 01/19/2014, 12/29/2014   Pneumococcal Conjugate-13 02/06/2010, 06/11/2013   Pneumococcal Polysaccharide-23 02/11/2018   Screening Tests Health Maintenance  Topic Date Due   TETANUS/TDAP  09/24/1963   FOOT EXAM  03/04/2018   OPHTHALMOLOGY EXAM  06/18/2018   HEMOGLOBIN A1C  03/12/2019   MAMMOGRAM  04/07/2019   COLONOSCOPY  11/28/2021   INFLUENZA VACCINE  Completed   DEXA SCAN  Completed   Hepatitis C Screening  Completed   PNA vac Low Risk Adult  Completed      Plan:   Keep all routine maintenance appointments.   Follow up with CCM pharmacist 02/18/19  Nurse visit 03/01/19- B12  Next scheduled lab 03/12/19  Follow up 03/15/19   Medicare Attestation I have personally reviewed: The patient's medical and social history Their use of alcohol, tobacco or illicit drugs Their current medications and supplements The patient's functional ability including ADLs,fall risks, home safety risks, cognitive, and hearing and visual impairment Diet and physical activities Evidence for depression   In addition, I have reviewed and discussed with patient certain preventive protocols, quality metrics, and best practice recommendations. A written personalized care plan for preventive services as well as general preventive health recommendations were provided to patient via mail.     Varney Biles, LPN  01/60/1093   Reviewed above information.  Agree with assessment and plan.    Dr Nicki Reaper

## 2019-01-27 NOTE — Patient Instructions (Addendum)
  Ms. Steinhauser , Thank you for taking time to come for your Medicare Wellness Visit. I appreciate your ongoing commitment to your health goals. Please review the following plan we discussed and let me know if I can assist you in the future.   These are the goals we discussed: Goals      Patient Stated   . "I can't afford my medications" (pt-stated)     Current Barriers:  . Diabetes: uncontrolled though improved per BG; most recent A1c 7.8%, due, but has labwork and appointment scheduled w/ PCP in December  . Current antihyperglycemic regimen: Pioglitazone 15 mg daily, Basaglar 30 units daily, [name] 4 with breakfast sometimes (rarely); 6 units with lunch, 8 units with supper  o GLP1 not a option d/t significant IBS. Renal function low for SGLT2 to provide glycemic benefit. . Denies any episodes of hypoglycemia since our last phone call o Has shifted towards eating salads for lunch to prevent hypoglycemia overnight  . Current exercise: playing with her grandchildren and great grandchildren . Current blood glucose readings: All fastings <120s; post supper average ~160s-210s;  . Cardiovascular risk reduction: o Current hypertensive regimen: amlodipine 10 mg QPM, carvedilol 6.25 mg BID, HCTZ 25 mg QAM, lisinopril 20 mg BID; BP at goal <130/80 on last check o Current hyperlipidemia regimen: simvastatin 10 mg daily; LDL at goal <70 on last check  Pharmacist Clinical Goal(s):  Marland Kitchen Over the next 90 days, patient with work with PharmD and primary care provider to address optimized glycemic management  Interventions: . Comprehensive medication review performed, medication list updated in electronic medical record . Congratulated patient on continued maintenance of at goal fasting glucose  . Discussed that average post-supper reading is not at goal. Increase [name] to 9-10 units with supper, continue 4 units with breakfast and 6 units with lunch. Continue Basaglar 30 units daily. Patient understands to  titrate mealtime insulin to goal <180 . Discussed increased risk of fracture w/ pioglitazone. Reviewed last DEXA results from 2017; was normal. Worth considering discontinuation of pioglitazone. Will discuss w/ Dr. Nicki Reaper, and consider this moving forward, with titration of insulin as needed to maintain goal blood sugars.  . Patient noted that she needs a refill on [name]. Contacted [facility]; left voicemail with refill.   Patient Self Care Activities:  . Patient will check blood glucose BID , document, and provide at future appointments . Patient will take medications as prescribed . Patient will report any questions or concerns to provider   Please see past updates related to this goal by clicking on the "Past Updates" button in the selected goal         This is a list of the screening recommended for you and due dates:  Health Maintenance  Topic Date Due  . Tetanus Vaccine  09/24/1963  . Complete foot exam   03/04/2018  . Eye exam for diabetics  06/18/2018  . Hemoglobin A1C  03/12/2019  . Mammogram  04/07/2019  . Colon Cancer Screening  11/28/2021  . Flu Shot  Completed  . DEXA scan (bone density measurement)  Completed  .  Hepatitis C: One time screening is recommended by Center for Disease Control  (CDC) for  adults born from [birth year] through 1966.   Completed  . Pneumonia vaccines  Completed

## 2019-02-02 DIAGNOSIS — E119 Type 2 diabetes mellitus without complications: Secondary | ICD-10-CM | POA: Diagnosis not present

## 2019-02-02 LAB — HM DIABETES EYE EXAM

## 2019-02-03 ENCOUNTER — Other Ambulatory Visit: Payer: Self-pay | Admitting: Internal Medicine

## 2019-02-03 DIAGNOSIS — I10 Essential (primary) hypertension: Secondary | ICD-10-CM

## 2019-02-08 DIAGNOSIS — K8681 Exocrine pancreatic insufficiency: Secondary | ICD-10-CM | POA: Diagnosis not present

## 2019-02-08 DIAGNOSIS — R1012 Left upper quadrant pain: Secondary | ICD-10-CM | POA: Diagnosis not present

## 2019-02-08 DIAGNOSIS — K580 Irritable bowel syndrome with diarrhea: Secondary | ICD-10-CM | POA: Diagnosis not present

## 2019-02-09 ENCOUNTER — Ambulatory Visit: Payer: Self-pay | Admitting: Pharmacist

## 2019-02-09 ENCOUNTER — Other Ambulatory Visit: Payer: Self-pay | Admitting: Pharmacy Technician

## 2019-02-09 DIAGNOSIS — K589 Irritable bowel syndrome without diarrhea: Secondary | ICD-10-CM

## 2019-02-09 NOTE — Patient Instructions (Signed)
Visit Information  Goals Addressed            This Visit's Progress     Patient Stated   . "I can't afford this new medication" (pt-stated)       Current Barriers:  . Financial Barriers: patient recently prescribed [name] pancreatic enzyme replacement and reports copay will be >$200, and she cannot afford this. Wonders if there is patient assistance for this medication.  Pharmacist Clinical Goal(s):  Marland Kitchen Over the next 30 days, patient will work with PharmD and providers to relieve medication access concerns  Interventions: . [name] by Dina Rich: Patient meets income requirement; will collaborate with Susy Frizzle, CPhT to mail patient her portion of the application and required materials. Will collaborate with [facility] gastroenterology NP Stephens November for prescriber portion.   Patient Self Care Activities:  . Patient will provide necessary portions of application   Initial goal documentation        The patient verbalized understanding of instructions provided today and declined a print copy of patient instruction materials.   Plan:  - Will collaborate with Susy Frizzle, CPhT and providers as above  Catie Darnelle Maffucci, PharmD, [place] Pharmacist [place] [phone]

## 2019-02-09 NOTE — Chronic Care Management (AMB) (Signed)
Chronic Care Management   Follow Up Note   02/09/2019 Name: Beth Tyler MRN: [id number] DOB: 07-Sep-1945  Referred by: Einar Pheasant, MD Reason for referral : Chronic Care Management (Medication Management)   Beth Tyler is a 73 y.o. year old female who is a primary care patient of Einar Pheasant, MD. The CCM team was consulted for assistance with chronic disease management and care coordination needs.    Received call from patient today regarding medication assistance needs  Review of patient status, including review of consultants reports, relevant laboratory and other test results, and collaboration with appropriate care team members and the patient's provider was performed as part of comprehensive patient evaluation and provision of chronic care management services.    SDOH (Social Determinants of Health) screening performed today: Financial Strain . See Care Plan for related entries.   Advanced Directives Status: N See Care Plan and [employer] application for related entries.  Outpatient Encounter Medications as of 02/09/2019  Medication Sig Note  . acetaminophen (TYLENOL) 500 MG tablet Take 500 mg by mouth every 6 (six) hours as needed.   Marland Kitchen amLODipine (NORVASC) 10 MG tablet Take 1 tablet by mouth once daily 12/17/2018: PM  . Blood Glucose Monitoring Suppl (ONETOUCH VERIO FLEX SYSTEM) w/Device KIT    . carvedilol (COREG) 6.25 MG tablet Take 1 tablet (6.25 mg total) by mouth 2 (two) times daily with a meal.   . fluticasone (CUTIVATE) 0.05 % cream USE AS DIRECTED   . fluticasone furoate-vilanterol (BREO ELLIPTA) 200-25 MCG/INH AEPB Inhale 1 puff into the lungs daily.   . Fluticasone-Salmeterol (ADVAIR DISKUS) 250-50 MCG/DOSE AEPB Inhale 1 puff into the lungs 2 (two) times daily.   . hydrochlorothiazide (HYDRODIURIL) 25 MG tablet Take 1 tablet (25 mg total) by mouth daily. 12/17/2018: AM  . Insulin Glargine (BASAGLAR KWIKPEN) 100 UNIT/ML SOPN Inject 0.3 mLs (30 Units total) into  the skin daily.   . insulin lispro ([name]) 100 UNIT/ML injection Inject up to 10 units TID with meals   . Insulin Pen Needle 32G X 4 MM MISC Use to inject insulin three times daily   . lisinopril (ZESTRIL) 20 MG tablet TAKE 1 TABLET BY MOUTH IN THE MORNING AND 2 AT BEDTIME   . magnesium oxide (MAG-OX) 400 (241.3 Mg) MG tablet Take 1 tablet by mouth once daily   . metFORMIN (GLUCOPHAGE-XR) 500 MG 24 hr tablet Take 2 tablets by mouth twice daily   . ONETOUCH DELICA LANCETS 55D MISC    . ONETOUCH VERIO test strip USE 1 STRIP TO CHECK GLUCOSE TWICE DAILY   . pantoprazole (PROTONIX) 40 MG tablet TAKE 1 TABLET BY MOUTH TWICE DAILY BEFORE MEAL(S)   . pioglitazone (ACTOS) 15 MG tablet Take 1 tablet (15 mg total) by mouth daily.   . simvastatin (ZOCOR) 10 MG tablet TAKE 1 TABLET BY MOUTH AT BEDTIME   . triamcinolone cream (KENALOG) 0.1 % APPLY CREAM EXTERNALLY TWICE DAILY    No facility-administered encounter medications on file as of 02/09/2019.      Goals Addressed            This Visit's Progress     Patient Stated   . "I can't afford this new medication" (pt-stated)       Current Barriers:  . Financial Barriers: patient recently prescribed [name] pancreatic enzyme replacement and reports copay will be >$200, and she cannot afford this. Wonders if there is patient assistance for this medication.  Pharmacist Clinical Goal(s):  Marland Kitchen Over the  next 30 days, patient will work with PharmD and providers to relieve medication access concerns  Interventions: . [name] by Dina Rich: Patient meets income requirement; will collaborate with Susy Frizzle, CPhT to mail patient her portion of the application and required materials. Will collaborate with [facility] gastroenterology NP Stephens November for prescriber portion.   Patient Self Care Activities:  . Patient will provide necessary portions of application   Initial goal documentation         Plan:  - Will collaborate with Susy Frizzle, CPhT and providers  as above  Catie Darnelle Maffucci, PharmD, [place] Pharmacist [place] [phone]

## 2019-02-09 NOTE — Patient Outreach (Signed)
[place] [facility]) Care Management  02/09/2019  SHACARRA CHOE 1945/12/18 [id number]                                        Medication Assistance Referral  Referral From: [employer] Catie T. (Clinic RPh)  Medication/Company: [employer] [place] Dina Rich Patient application portion:  Mailed Provider application portion: Faxed  to NP [employer] address/fax verified via: Office website     Follow up:  Will follow up with patient in 5-10 business days to confirm application(s) have been received.  Paulla Mcclaskey P. Maximilien Hayashi, [place] Management [phone]

## 2019-02-10 NOTE — Progress Notes (Signed)
Reviewed.  Agree with plan   Dr Jullian Clayson 

## 2019-02-17 ENCOUNTER — Other Ambulatory Visit: Payer: Self-pay | Admitting: Pharmacy Technician

## 2019-02-17 NOTE — Patient Outreach (Signed)
[place] [facility]) Care Management  02/17/2019  Beth Tyler 1946/01/12 [id number]  Unsuccessful outreach call placed to patient in regards to [name] application for [name].  Unfortunately patient did not answer the phone,  HIPAA compliant voicemail left.  Was calling patient to inquire if she has received the application that was mailed to her.  Will followup with patient in 3-7 business days if call is not returned.  Beth Tyler, [place] Management [phone]

## 2019-02-18 ENCOUNTER — Ambulatory Visit (INDEPENDENT_AMBULATORY_CARE_PROVIDER_SITE_OTHER): Payer: Medicare HMO | Admitting: Pharmacist

## 2019-02-18 DIAGNOSIS — E1140 Type 2 diabetes mellitus with diabetic neuropathy, unspecified: Secondary | ICD-10-CM | POA: Diagnosis not present

## 2019-02-18 DIAGNOSIS — Z794 Long term (current) use of insulin: Secondary | ICD-10-CM | POA: Diagnosis not present

## 2019-02-18 DIAGNOSIS — K589 Irritable bowel syndrome without diarrhea: Secondary | ICD-10-CM

## 2019-02-18 NOTE — Chronic Care Management (AMB) (Signed)
Chronic Care Management   Follow Up Note   02/18/2019 Name: Beth Tyler MRN: [id number] DOB: October 06, 1945  Referred by: Einar Pheasant, MD Reason for referral : Chronic Care Management (Medication Management)   Beth Tyler is a 73 y.o. year old female who is a primary care patient of Einar Pheasant, MD. The CCM team was consulted for assistance with chronic disease management and care coordination needs.    Contacted patient for medication management review today.   Review of patient status, including review of consultants reports, relevant laboratory and other test results, and collaboration with appropriate care team members and the patient's provider was performed as part of comprehensive patient evaluation and provision of chronic care management services.    SDOH (Social Determinants of Health) screening performed today: Financial Strain . See Care Plan for related entries.   Outpatient Encounter Medications as of 02/18/2019  Medication Sig Note  . Insulin Glargine (BASAGLAR KWIKPEN) 100 UNIT/ML SOPN Inject 0.3 mLs (30 Units total) into the skin daily. 02/18/2019: 28 units  . acetaminophen (TYLENOL) 500 MG tablet Take 500 mg by mouth every 6 (six) hours as needed.   Marland Kitchen amLODipine (NORVASC) 10 MG tablet Take 1 tablet by mouth once daily 12/17/2018: PM  . Blood Glucose Monitoring Suppl (ONETOUCH VERIO FLEX SYSTEM) w/Device KIT    . carvedilol (COREG) 6.25 MG tablet Take 1 tablet (6.25 mg total) by mouth 2 (two) times daily with a meal.   . fluticasone (CUTIVATE) 0.05 % cream USE AS DIRECTED   . fluticasone furoate-vilanterol (BREO ELLIPTA) 200-25 MCG/INH AEPB Inhale 1 puff into the lungs daily.   . Fluticasone-Salmeterol (ADVAIR DISKUS) 250-50 MCG/DOSE AEPB Inhale 1 puff into the lungs 2 (two) times daily.   . hydrochlorothiazide (HYDRODIURIL) 25 MG tablet Take 1 tablet (25 mg total) by mouth daily. 12/17/2018: AM  . insulin lispro ([name]) 100 UNIT/ML injection Inject up to  10 units TID with meals   . Insulin Pen Needle 32G X 4 MM MISC Use to inject insulin three times daily   . lipase/protease/amylase ([name]) 36000 UNITS CPEP capsule Take by mouth. 2 capsules TID w/ meals and 1 w/ snack   . lisinopril (ZESTRIL) 20 MG tablet TAKE 1 TABLET BY MOUTH IN THE MORNING AND 2 AT BEDTIME   . magnesium oxide (MAG-OX) 400 (241.3 Mg) MG tablet Take 1 tablet by mouth once daily   . metFORMIN (GLUCOPHAGE-XR) 500 MG 24 hr tablet Take 2 tablets by mouth twice daily   . ONETOUCH DELICA LANCETS 56P MISC    . ONETOUCH VERIO test strip USE 1 STRIP TO CHECK GLUCOSE TWICE DAILY   . pantoprazole (PROTONIX) 40 MG tablet TAKE 1 TABLET BY MOUTH TWICE DAILY BEFORE MEAL(S)   . pioglitazone (ACTOS) 15 MG tablet Take 1 tablet (15 mg total) by mouth daily.   . simvastatin (ZOCOR) 10 MG tablet TAKE 1 TABLET BY MOUTH AT BEDTIME   . triamcinolone cream (KENALOG) 0.1 % APPLY CREAM EXTERNALLY TWICE DAILY    No facility-administered encounter medications on file as of 02/18/2019.      Goals Addressed            This Visit's Progress     Patient Stated   . "I can't afford my medications" (pt-stated)       Current Barriers:  . Diabetes: uncontrolled though improved per BG; most recent A1c 7.8%, due, but has labwork and appointment scheduled w/ PCP in December  . Current antihyperglycemic regimen: Pioglitazone 15 mg daily,  Basaglar 28 units daily, [name] 4 with breakfast sometimes; 6 units with lunch, 8 units with supper  o GLP1 not a option d/t significant IBS.Renal function low for SGLT2 to provide glycemic benefit. . Reports sugars remaining in 70-80s over the past few weeks d/t decreased appetite w/ pancreas flare. Reports that she held [name] if she didn't eat a meal, but continued the same dose of Basaglar  . Current exercise: playing with her grandchildren and great grandchildren . Current blood glucose readings: Checking QID; fasting/preprandial 100-120s, though have been lower  recently due to flare of pancreatic insufficiency, nausea, diarrhea; post prandial 160-190s . Cardiovascular risk reduction: o Current hypertensive regimen: amlodipine 10 mg QPM, carvedilol 6.25 mg BID, HCTZ 25 mg QAM, lisinopril 20 mg BID; BP at goal <130/80 on last check o Current hyperlipidemia regimen: simvastatin 10 mg daily; LDL at goal <70 on last check  Pharmacist Clinical Goal(s):  Marland Kitchen Over the next 90 days, patient with work with PharmD and primary care provider to address optimized glycemic management  Interventions: . Comprehensive medication review performed, medication list updated in electronic medical record . Reviewed that if she has an illness again in the future that prevents her from eating, hold [name] as she did, but feel free to reduce Basaglar dosing as well.  . Discussed her frequency of checks and episodes of hypoglycemia. Patient is a candidate for CGM d/t frequency of insulin injections and fingersticks. May be a good candidate for [place] 2, pending medicare coverage. Patient amenable to pursuing coverage. [name], provided patient information. They will outreach patient to begin the benefits investigation process and contact the patient to discuss. If patient amenable to any copay, [name] will fax DWO to the clinic for completion. I will collaborate w/ PCP and [name], LPN for documentation requirements.   Patient Self Care Activities:  . Patient will check blood glucose QID, document, and provide at future appointments . Patient will take medications as prescribed . Patient will report any questions or concerns to provider   Please see past updates related to this goal by clicking on the "Past Updates" button in the selected goal      . "I can't afford this new medication" (pt-stated)       Current Barriers:  . Financial Barriers: patient recently prescribed [name] pancreatic enzyme replacement and reports copay will be >$200, and she  cannot afford this. o Decided to pursue patient assistance  Pharmacist Clinical Goal(s):  Marland Kitchen Over the next 30 days, patient will work with PharmD and providers to relieve medication access concerns  Interventions: . Patient confirms that she receive and completed her portion of the application and mailed back to [employer], CPhT in the beginning of the week. She notes she is going by the pharmacy today to retreive a print out to demonstrate the copay for the medication to include in application, and she will bring this by clinic for me next week. I will pass this along to [place].   Patient Self Care Activities:  . Patient will provide necessary portions of application   Please see past updates related to this goal by clicking on the "Past Updates" button in the selected goal         Plan: - Will collaborate w/ Susy Frizzle, CPhT as above on medication access needs - Will collaborate w/ clinic staff on [employer] coverage  Catie Darnelle Maffucci, PharmD, [place] Pharmacist [facility] [phone]

## 2019-02-18 NOTE — Progress Notes (Signed)
Reviewed above information.  Agree with assessment and plan.    Dr Niana Martorana 

## 2019-02-18 NOTE — Patient Instructions (Signed)
Visit Information  Goals Addressed            This Visit's Progress     Patient Stated   . "I can't afford my medications" (pt-stated)       Current Barriers:  . Diabetes: uncontrolled though improved per BG; most recent A1c 7.8%, due, but has labwork and appointment scheduled w/ PCP in December  . Current antihyperglycemic regimen: Pioglitazone 15 mg daily, Basaglar 28 units daily, [name] 4 with breakfast sometimes; 6 units with lunch, 8 units with supper  o GLP1 not a option d/t significant IBS.Renal function low for SGLT2 to provide glycemic benefit. . Reports sugars remaining in 70-80s over the past few weeks d/t decreased appetite w/ pancreas flare. Reports that she held [name] if she didn't eat a meal, but continued the same dose of Basaglar  . Current exercise: playing with her grandchildren and great grandchildren . Current blood glucose readings: Checking QID; fasting/preprandial 100-120s, though have been lower recently due to flare of pancreatic insufficiency, nausea, diarrhea; post prandial 160-190s . Cardiovascular risk reduction: o Current hypertensive regimen: amlodipine 10 mg QPM, carvedilol 6.25 mg BID, HCTZ 25 mg QAM, lisinopril 20 mg BID; BP at goal <130/80 on last check o Current hyperlipidemia regimen: simvastatin 10 mg daily; LDL at goal <70 on last check  Pharmacist Clinical Goal(s):  Marland Kitchen Over the next 90 days, patient with work with PharmD and primary care provider to address optimized glycemic management  Interventions: . Comprehensive medication review performed, medication list updated in electronic medical record . Reviewed that if she has an illness again in the future that prevents her from eating, hold [name] as she did, but feel free to reduce Basaglar dosing as well.  . Discussed her frequency of checks and episodes of hypoglycemia. Patient is a candidate for CGM d/t frequency of insulin injections and fingersticks. May be a good candidate for [place] 2,  pending medicare coverage. Patient amenable to pursuing coverage. Contacted [facility] pharmacy, provided patient information. They will outreach patient to begin the benefits investigation process and contact the patient to discuss. If patient amenable to any copay, Felisa Bonier will fax DWO to the clinic for completion. I will collaborate w/ PCP and Eustace Quail, LPN for documentation requirements.   Patient Self Care Activities:  . Patient will check blood glucose QID, document, and provide at future appointments . Patient will take medications as prescribed . Patient will report any questions or concerns to provider   Please see past updates related to this goal by clicking on the "Past Updates" button in the selected goal      . "I can't afford this new medication" (pt-stated)       Current Barriers:  . Financial Barriers: patient recently prescribed [name] pancreatic enzyme replacement and reports copay will be >$200, and she cannot afford this. o Decided to pursue patient assistance  Pharmacist Clinical Goal(s):  Marland Kitchen Over the next 30 days, patient will work with PharmD and providers to relieve medication access concerns  Interventions: . Patient confirms that she receive and completed her portion of the application and mailed back to [employer], CPhT in the beginning of the week. She notes she is going by the pharmacy today to retreive a print out to demonstrate the copay for the medication to include in application, and she will bring this by clinic for me next week. I will pass this along to [place].   Patient Self Care Activities:  . Patient will provide necessary portions of  application   Please see past updates related to this goal by clicking on the "Past Updates" button in the selected goal         The patient verbalized understanding of instructions provided today and declined a print copy of patient instruction materials.   Plan: - Will collaborate w/ Susy Frizzle, CPhT as above  on medication access needs - Will collaborate w/ clinic staff on [place] coverage  [place], PharmD, [place] Pharmacist [facility] [place] [phone]

## 2019-02-19 ENCOUNTER — Other Ambulatory Visit: Payer: Self-pay | Admitting: Pharmacy Technician

## 2019-02-19 NOTE — Patient Outreach (Signed)
[place] [facility]) Care Management  02/19/2019  Beth Tyler Nov 16, 1945 [id number]   Successful outgoing call placed to patient in regards to [name] patient assistance company for [name].  Spoke to patient, HIPAA identifiers verified.  Inquired if patient could provide me her MIB number as Beth Tyler requires that information on the application. Patient was able to provide me that over the phone.  Once provider's portion is received will fax completed application into [name].  Jaquavious Mercer P. Marnell Mcdaniel, [place] Management [phone]

## 2019-02-23 ENCOUNTER — Other Ambulatory Visit: Payer: Self-pay | Admitting: Pharmacy Technician

## 2019-02-23 ENCOUNTER — Other Ambulatory Visit: Payer: Self-pay | Admitting: Internal Medicine

## 2019-02-23 NOTE — Patient Outreach (Signed)
[place] [facility]) Care Management  02/23/2019  BRITINY DEFRAIN Apr 30, 1945 [id number]    Care coordination call placed to NP Tidioute London's office in regards to the provider portion of the myABBVIE assist patient assistance application for [name].  Spoke to receptionist who informed the nurse was in the room with a patient. Provided my name and number and requested a call back.  Was calling to inquire if they had received and/or faxed back the provider portion of the application. I have the patient's part, just need provider's part, so that entire application can be submitted to the patient assistance company.  Will await a return call.  Irma Delancey P. Greg Eckrich, [place] Management [phone]

## 2019-02-24 ENCOUNTER — Other Ambulatory Visit: Payer: Self-pay | Admitting: Internal Medicine

## 2019-02-24 ENCOUNTER — Other Ambulatory Visit: Payer: Self-pay | Admitting: Pharmacy Technician

## 2019-02-24 DIAGNOSIS — I10 Essential (primary) hypertension: Secondary | ICD-10-CM

## 2019-02-24 NOTE — Patient Outreach (Signed)
[place] [facility]) Care Management  02/24/2019  Beth Tyler 12-28-1945 [id number]  Incoming call received from NP [place] London's office in regards to voicemail message that was left for the nurse yesterday.  Spoke to [place] who informed they have received the provider's part and had sent it in to [place]. Beth Tyler informed they were in need of the patient's proof of income. Informed Beth Tyler that I had all of that information and that I was just in need of the provider's part so I could submit the whole application together. Beth Tyler informed she would fax over the provider's portion today.  Will await return of document so can submit application.  Beth Tyler P. Beth Tyler, [place] Management [phone]

## 2019-02-25 ENCOUNTER — Other Ambulatory Visit: Payer: Self-pay | Admitting: Pharmacy Technician

## 2019-02-25 ENCOUNTER — Encounter: Payer: Self-pay | Admitting: Internal Medicine

## 2019-02-25 NOTE — Patient Outreach (Signed)
[place] [facility]) Care Management  02/25/2019  Beth Tyler [birth year] [id number]    Received both patient and provider portion(s) of patient assistance application(s) for [name]. Faxed completed application and required documents into [name].  Will follow up with company(ies) in 7-10 business days to check status of application(s).  [name] P. Anniebell Bedore, [place] Management [phone]

## 2019-02-25 NOTE — Patient Outreach (Signed)
[facility]) Care Management  02/25/2019  Beth Tyler 10/21/1945 [id number]  Incoming call received from patient in regards to [facility] application for [name].  Spoke to patient, HIPAA identifiers verified.  Patient was calling to inquire if we have received all the necessary documents to submit her application.  Informed patient I submitted her application today. Informed her that we may need a printout from pharmacy. She informed if we need it then she has it in her possession.   Patient inquired if [name] would help with the pain. Placed patient on hold and phoned embedded [facility] who informed [name] is not a pain medication and that if patient is experiencing pain then she needs to call the GI doctor. Relayed this information to the patient. Patient verbalized understanding. Informed patient I would followup with the company around the 1st of December and would be in contact with her. Patient was agreeable to this plan.   Will followup with company in 7-10 business days as previously scheduled.  Omie Ferger P. Lanard Arguijo, [place] Management [phone]

## 2019-03-01 ENCOUNTER — Other Ambulatory Visit: Payer: Self-pay

## 2019-03-01 ENCOUNTER — Ambulatory Visit (INDEPENDENT_AMBULATORY_CARE_PROVIDER_SITE_OTHER): Payer: Medicare HMO

## 2019-03-01 DIAGNOSIS — E538 Deficiency of other specified B group vitamins: Secondary | ICD-10-CM

## 2019-03-01 MED ORDER — CYANOCOBALAMIN 1000 MCG/ML IJ SOLN
1000.0000 ug | Freq: Once | INTRAMUSCULAR | Status: AC
  Administered 2019-03-01: 1000 ug via INTRAMUSCULAR

## 2019-03-01 NOTE — Progress Notes (Addendum)
Patient presented today for B12 injection.  Administered IM in the right deltoid.  Patient tolerated well with no signs of distress.  Reviewed.  Dr Scott  

## 2019-03-09 ENCOUNTER — Ambulatory Visit: Payer: Self-pay | Admitting: Pharmacist

## 2019-03-09 NOTE — Progress Notes (Signed)
Reviewed information.  Agree with plan.    Dr Enslee Bibbins 

## 2019-03-09 NOTE — Chronic Care Management (AMB) (Signed)
  Chronic Care Management   Note  03/09/2019 Name: Beth Tyler MRN: [id number] DOB: 06/13/1945  [name] is a 73 y.o. year old female who is a primary care patient of Einar Pheasant, MD. The CCM team was consulted for assistance with chronic disease management and care coordination needs.    Received voicemail from patient that she received notice she had been APPROVED for [name] assistance. Noted that she had questions for me. I returned her call, left HIPAA compliant message for her to call me back at her convenience.   Follow up plan: - Will await call back from patient. Will notify [employer], CPhT of the above  Catie Darnelle Maffucci, PharmD, [place], [place] Clinical Pharmacist [facility] [phone]

## 2019-03-10 ENCOUNTER — Other Ambulatory Visit: Payer: Self-pay | Admitting: Pharmacy Technician

## 2019-03-10 ENCOUNTER — Other Ambulatory Visit: Payer: Self-pay

## 2019-03-10 NOTE — Patient Outreach (Signed)
[facility]) Care Management  03/10/2019  Beth Tyler 30-Sep-1945 [id number]   Care coordination call placed to [name] in regards to patient's application for [name].  Spoke to [place] who informed patient has been APPROVED 03/01/2019-04/07/2020. [facility] informed the medication was delivered to the patient's home today.  Will outreach patient with this information.  Leighanne Adolph P. [name], [place] Management [phone]

## 2019-03-10 NOTE — Patient Outreach (Signed)
[facility]) Care Management  03/10/2019  ABI SHOULTS 1945-07-06 [id number]   ADDENDUM   Successful call placed to patient regarding patient assistance medication delivery of [name] thru [name], HIPAA identifiers verified.   Informed patient she was approved for the program thru the end of December 2021. Informed patient a 30 days supply of medication was being delivered to her home today. Discussed refill procedure with patient and informed patient she could call into [name] on 04/03/2019 to order her next refill. Informed patient she could call [name] every 22-24 days to order her refills. Patient confirmed having the number to call for the refills. Patient verbalized understanding.Patient informed she had no other questions or concerns at this time. Confirmed patient had our names and numbers.   Follow up:  Will route note to embedded [facility] RPh Catie Darnelle Maffucci for case closure  Lailynn Southgate P. Corleen Otwell, [place] Management [phone]

## 2019-03-11 DIAGNOSIS — I890 Lymphedema, not elsewhere classified: Secondary | ICD-10-CM | POA: Diagnosis not present

## 2019-03-11 DIAGNOSIS — R600 Localized edema: Secondary | ICD-10-CM | POA: Diagnosis not present

## 2019-03-11 DIAGNOSIS — I10 Essential (primary) hypertension: Secondary | ICD-10-CM | POA: Diagnosis not present

## 2019-03-11 DIAGNOSIS — R0602 Shortness of breath: Secondary | ICD-10-CM | POA: Diagnosis not present

## 2019-03-12 ENCOUNTER — Other Ambulatory Visit (INDEPENDENT_AMBULATORY_CARE_PROVIDER_SITE_OTHER): Payer: Medicare HMO

## 2019-03-12 ENCOUNTER — Other Ambulatory Visit: Payer: Self-pay | Admitting: Internal Medicine

## 2019-03-12 ENCOUNTER — Other Ambulatory Visit: Payer: Self-pay

## 2019-03-12 DIAGNOSIS — E1140 Type 2 diabetes mellitus with diabetic neuropathy, unspecified: Secondary | ICD-10-CM | POA: Diagnosis not present

## 2019-03-12 DIAGNOSIS — E785 Hyperlipidemia, unspecified: Secondary | ICD-10-CM

## 2019-03-12 DIAGNOSIS — I10 Essential (primary) hypertension: Secondary | ICD-10-CM

## 2019-03-12 DIAGNOSIS — Z794 Long term (current) use of insulin: Secondary | ICD-10-CM

## 2019-03-12 DIAGNOSIS — N183 Chronic kidney disease, stage 3 unspecified: Secondary | ICD-10-CM

## 2019-03-12 LAB — BASIC METABOLIC PANEL
BUN: 19 mg/dL (ref 6–23)
CO2: 27 mEq/L (ref 19–32)
Calcium: 9.6 mg/dL (ref 8.4–10.5)
Chloride: 102 mEq/L (ref 96–112)
Creatinine, Ser: 0.94 mg/dL (ref 0.40–1.20)
GFR: 58.14 mL/min — ABNORMAL LOW (ref >60.00)
Glucose, Bld: 155 mg/dL — ABNORMAL HIGH (ref 70–99)
Potassium: 4.2 mEq/L (ref 3.5–5.1)
Sodium: 140 mEq/L (ref 135–145)

## 2019-03-12 LAB — HEMOGLOBIN A1C: Hgb A1c MFr Bld: 7.8 % — ABNORMAL HIGH (ref 4.6–6.5)

## 2019-03-12 LAB — LIPID PANEL
Cholesterol: 114 mg/dL (ref 0–200)
HDL: 45.2 mg/dL (ref >39.00)
LDL Cholesterol: 37 mg/dL (ref 0–99)
NonHDL: 69.25
Total CHOL/HDL Ratio: 3
Triglycerides: 160 mg/dL — ABNORMAL HIGH (ref 0.0–149.0)
VLDL: 32 mg/dL (ref 0.0–40.0)

## 2019-03-12 LAB — HEPATIC FUNCTION PANEL
ALT: 22 U/L (ref 0–35)
AST: 17 U/L (ref 0–37)
Albumin: 4.3 g/dL (ref 3.5–5.2)
Alkaline Phosphatase: 88 U/L (ref 39–117)
Bilirubin, Direct: 0.1 mg/dL (ref 0.0–0.3)
Total Bilirubin: 0.7 mg/dL (ref 0.2–1.2)
Total Protein: 6.7 g/dL (ref 6.0–8.3)

## 2019-03-12 NOTE — Progress Notes (Signed)
Order placed for labs.

## 2019-03-14 ENCOUNTER — Other Ambulatory Visit: Payer: Self-pay | Admitting: Internal Medicine

## 2019-03-15 ENCOUNTER — Other Ambulatory Visit: Payer: Self-pay

## 2019-03-15 ENCOUNTER — Encounter: Payer: Self-pay | Admitting: Internal Medicine

## 2019-03-15 ENCOUNTER — Ambulatory Visit (INDEPENDENT_AMBULATORY_CARE_PROVIDER_SITE_OTHER): Payer: Medicare HMO | Admitting: Internal Medicine

## 2019-03-15 DIAGNOSIS — E139 Other specified diabetes mellitus without complications: Secondary | ICD-10-CM | POA: Diagnosis not present

## 2019-03-15 DIAGNOSIS — I10 Essential (primary) hypertension: Secondary | ICD-10-CM

## 2019-03-15 DIAGNOSIS — K219 Gastro-esophageal reflux disease without esophagitis: Secondary | ICD-10-CM

## 2019-03-15 DIAGNOSIS — E538 Deficiency of other specified B group vitamins: Secondary | ICD-10-CM

## 2019-03-15 DIAGNOSIS — R600 Localized edema: Secondary | ICD-10-CM | POA: Diagnosis not present

## 2019-03-15 DIAGNOSIS — I700 Atherosclerosis of aorta: Secondary | ICD-10-CM

## 2019-03-15 DIAGNOSIS — E785 Hyperlipidemia, unspecified: Secondary | ICD-10-CM | POA: Diagnosis not present

## 2019-03-15 DIAGNOSIS — E1140 Type 2 diabetes mellitus with diabetic neuropathy, unspecified: Secondary | ICD-10-CM | POA: Diagnosis not present

## 2019-03-15 DIAGNOSIS — Z794 Long term (current) use of insulin: Secondary | ICD-10-CM

## 2019-03-15 NOTE — Progress Notes (Signed)
Patient ID: [name], female   DOB: [birth year], 73 y.o.   MRN: [id number]   Virtual Visit via video Note This visit type was conducted due to national recommendations for restrictions regarding the COVID-19 pandemic (e.g. social distancing).  This format is felt to be most appropriate for this patient at this time.  All issues noted in this document were discussed and addressed.  No physical exam was performed (except for noted visual exam findings with Video Visits).   I connected with Beth Tyler by a video enabled telemedicine application or telephone and verified that I am speaking with the correct person using two identifiers. Location patient: home Location provider: work or home office Persons participating in the virtual visit: patient, provider  I discussed the limitations, risks, security and privacy concerns of performing an evaluation and management service by telephone and the availability of in person appointments. The patient expressed understanding and agreed to proceed.   Reason for visit:  Scheduled follow up.   HPI: Scheduled follow up.  Has been seeing GI for f/u of chronic diarrhea/EPI.  She is still having persistent diarrhea.  Trial of creon.  She is eating.  Tries to watch what she eats.  Using basaglar and humalog with meals.  Was having problems with low sugars.  Better now.  Is still having bowel issues.  Also some persistent itching.  No chest pain.  Breathing stable.  Saw cardiology recently.  Felt stable.  No report of increased heart rate or palpitations.  Pulse rate varying.  Occasionally will notice pulse reate 40-50s - in the evening.  Has lost weight.  States weight is 217 pounds.  Some variation in blood pressure.  Does report some pain - upper left hand.  No known injury.     ROS: See pertinent positives and negatives per HPI.  Past Medical History:  Diagnosis Date  . Anemia   . Arthritis   . B12 deficiency   . Carpal tunnel syndrome, bilateral    . Diabetes mellitus ([place]) 02/17/2012  . Diabetes mellitus, type II ([place])   . Dyspnea   . Gastritis   . GERD (gastroesophageal reflux disease)   . Hypercholesterolemia   . Hypertension   . IBS (irritable bowel syndrome)   . Neuropathy    legs  . Pernicious anemia   . Vertigo    no episodes fo 9 or 10 yrs    Past Surgical History:  Procedure Laterality Date  . ANTERIOR VITRECTOMY Right 12/01/2017   Procedure: ANTERIOR VITRECTOMY;  Surgeon: Eulogio Bear, MD;  Location: [place];  Service: Ophthalmology;  Laterality: Right;  . BREAST BIOPSY Right 1980   neg  . BREAST SURGERY  1980   benign cyst removal  . CATARACT EXTRACTION W/PHACO Left 10/13/2017   Procedure: CATARACT EXTRACTION PHACO AND INTRAOCULAR LENS PLACEMENT ([place])  LEFT  DIABETES;  Surgeon: Eulogio Bear, MD;  Location: [facility];  Service: Ophthalmology;  Laterality: Left;  Diabetic - insulin and oral meds  . CATARACT EXTRACTION W/PHACO Right 12/01/2017   Procedure: CATARACT EXTRACTION PHACO AND INTRAOCULAR LENS PLACEMENT ([place])  RIGHT DIABETIC;  Surgeon: Eulogio Bear, MD;  Location: [place];  Service: Ophthalmology;  Laterality: Right;  Diabetic - insulin and oral  . CHOLECYSTECTOMY  1983    Family History  Problem Relation Age of Onset  . Diabetes Father   . Kidney disease Father   . Heart disease Father   . Alcohol abuse Mother   .  Dementia Mother   . Diabetes Mother   . Diabetes Other   . Heart disease Son 71       CABG  . Hemophilia Brother   . Uterine cancer Sister   . Diabetes Sister   . COPD Sister   . Breast cancer Maternal Aunt 51  . Breast cancer Daughter 32  . Stroke Daughter     SOCIAL HX: reviewed.   No current facility-administered medications for this visit. No current outpatient medications on file.  Facility-Administered Medications Ordered in Other Visits:  .  0.9 %  sodium chloride infusion, 250 mL, Intravenous, PRN, Samella Parr, NP  .  acetaminophen (TYLENOL) tablet 650 mg, 650 mg, Oral, Q4H PRN, Samella Parr, NP, 650 mg at 03/19/19 2243 .  apixaban (ELIQUIS) tablet 5 mg, 5 mg, Oral, BID, Teodoro Spray, MD, 5 mg at 03/20/19 [phone] .  carvedilol (COREG) tablet 12.5 mg, 12.5 mg, Oral, BID WC, Samella Parr, NP, 12.5 mg at 03/20/19 7681 .  dexamethasone (DECADRON) tablet 4 mg, 4 mg, Oral, Q12H, Lorella Nimrod, MD, 4 mg at 03/20/19 1027 .  diltiazem (CARDIZEM CD) 24 hr capsule 120 mg, 120 mg, Oral, Daily, Teodoro Spray, MD, 120 mg at 03/20/19 1572 .  insulin aspart (novoLOG) injection 0-20 Units, 0-20 Units, Subcutaneous, TID WC, Lorella Nimrod, MD, 15 Units at 03/20/19 1156 .  insulin aspart (novoLOG) injection 0-5 Units, 0-5 Units, Subcutaneous, QHS, Amin, Sumayya, MD .  insulin glargine (LANTUS) injection 30 Units, 30 Units, Subcutaneous, Daily, Samella Parr, NP, 30 Units at 03/20/19 [phone] .  lipase/protease/amylase ([name]) capsule 36,000 Units, 36,000 Units, Oral, TID WC, Samella Parr, NP, 36,000 Units at 03/20/19 1027 .  lisinopril (ZESTRIL) tablet 20 mg, 20 mg, Oral, Daily, Teodoro Spray, MD, 20 mg at 03/20/19 [phone] .  magnesium oxide (MAG-OX) tablet 400 mg, 400 mg, Oral, Daily, Samella Parr, NP, 400 mg at 03/20/19 4163 .  ondansetron (ZOFRAN) injection 4 mg, 4 mg, Intravenous, Q6H PRN, Erin Hearing L, NP .  pioglitazone (ACTOS) tablet 15 mg, 15 mg, Oral, Daily, Erin Hearing L, NP, 15 mg at 03/20/19 1027 .  [COMPLETED] remdesivir 200 mg in sodium chloride 0.9% 250 mL IVPB, 200 mg, Intravenous, Once, Stopped at 03/19/19 1744 **FOLLOWED BY** remdesivir 100 mg in sodium chloride 0.9 % 100 mL IVPB, 100 mg, Intravenous, Daily, Shanlever, Pierce Crane, RPH, Last Rate: 200 mL/hr at 03/20/19 1027, 100 mg at 03/20/19 1027 .  simvastatin (ZOCOR) tablet 10 mg, 10 mg, Oral, q1800, Samella Parr, NP, 10 mg at 03/19/19 2201 .  sodium chloride flush (NS) 0.9 % injection 3 mL, 3 mL, Intravenous, Q12H, Erin Hearing L,  NP, 3 mL at 03/20/19 0839 .  sodium chloride flush (NS) 0.9 % injection 3 mL, 3 mL, Intravenous, PRN, Samella Parr, NP  EXAM:  GENERAL: alert.  Sounds to be in no acute distress.  Answering questions appropriately.    PSYCH/NEURO: pleasant and cooperative, no obvious depression or anxiety, speech and thought processing grossly intact  ASSESSMENT AND PLAN:  Discussed the following assessment and plan:  Aortic atherosclerosis ([place]) On simvastatin.    B12 deficiency Continue B12 injections.    Bilateral leg edema Has been evaluated by [facility].    Diabetes mellitus Working with Catie.  No low sugars recently.  Hs lost weight.  Continue low carb diet and exercise.  Follow met b and a1c.    Diabetes-pancreatic exocrine dysfunction syndrome [facility]) Being followed by  GI.    GERD (gastroesophageal reflux disease) Controlled on protonix.   Hyperlipidemia On simvastatin.  Low cholesterol diet and exercise.  Follow lipid panel and liver function tests.    Hypertension Blood pressure varying.  Has been overall under better control.  Follow pressures.  Follow metabolic panel.      I discussed the assessment and treatment plan with the patient. The patient was provided an opportunity to ask questions and all were answered. The patient agreed with the plan and demonstrated an understanding of the instructions.   The patient was advised to call back or seek an in-person evaluation if the symptoms worsen or if the condition fails to improve as anticipated.  I provided 21 minutes of non-face-to-face time during this encounter.   Einar Pheasant, MD

## 2019-03-18 ENCOUNTER — Emergency Department: Payer: Medicare HMO

## 2019-03-18 ENCOUNTER — Other Ambulatory Visit: Payer: Self-pay

## 2019-03-18 ENCOUNTER — Inpatient Hospital Stay
Admission: EM | Admit: 2019-03-18 | Discharge: 2019-03-22 | DRG: 178 | Disposition: A | Discharge status: Home / Self Care | Payer: Medicare HMO | Attending: Internal Medicine | Admitting: Internal Medicine

## 2019-03-18 DIAGNOSIS — I272 Pulmonary hypertension, unspecified: Secondary | ICD-10-CM | POA: Diagnosis present

## 2019-03-18 DIAGNOSIS — R000 Tachycardia, unspecified: Secondary | ICD-10-CM | POA: Diagnosis not present

## 2019-03-18 DIAGNOSIS — Z6841 Body Mass Index (BMI) 40.0 and over, adult: Secondary | ICD-10-CM

## 2019-03-18 DIAGNOSIS — Z794 Long term (current) use of insulin: Secondary | ICD-10-CM

## 2019-03-18 DIAGNOSIS — I129 Hypertensive chronic kidney disease with stage 1 through stage 4 chronic kidney disease, or unspecified chronic kidney disease: Secondary | ICD-10-CM | POA: Diagnosis present

## 2019-03-18 DIAGNOSIS — K589 Irritable bowel syndrome without diarrhea: Secondary | ICD-10-CM | POA: Diagnosis present

## 2019-03-18 DIAGNOSIS — E785 Hyperlipidemia, unspecified: Secondary | ICD-10-CM | POA: Diagnosis not present

## 2019-03-18 DIAGNOSIS — N183 Chronic kidney disease, stage 3 unspecified: Secondary | ICD-10-CM | POA: Diagnosis not present

## 2019-03-18 DIAGNOSIS — K219 Gastro-esophageal reflux disease without esophagitis: Secondary | ICD-10-CM | POA: Diagnosis present

## 2019-03-18 DIAGNOSIS — I4891 Unspecified atrial fibrillation: Secondary | ICD-10-CM | POA: Diagnosis present

## 2019-03-18 DIAGNOSIS — E7800 Pure hypercholesterolemia, unspecified: Secondary | ICD-10-CM | POA: Diagnosis not present

## 2019-03-18 DIAGNOSIS — Z9049 Acquired absence of other specified parts of digestive tract: Secondary | ICD-10-CM | POA: Diagnosis not present

## 2019-03-18 DIAGNOSIS — Z79899 Other long term (current) drug therapy: Secondary | ICD-10-CM

## 2019-03-18 DIAGNOSIS — E139 Other specified diabetes mellitus without complications: Secondary | ICD-10-CM | POA: Diagnosis present

## 2019-03-18 DIAGNOSIS — N1831 Chronic kidney disease, stage 3a: Secondary | ICD-10-CM

## 2019-03-18 DIAGNOSIS — Z7951 Long term (current) use of inhaled steroids: Secondary | ICD-10-CM

## 2019-03-18 DIAGNOSIS — E875 Hyperkalemia: Secondary | ICD-10-CM

## 2019-03-18 DIAGNOSIS — U071 COVID-19: Principal | ICD-10-CM | POA: Diagnosis present

## 2019-03-18 DIAGNOSIS — Z833 Family history of diabetes mellitus: Secondary | ICD-10-CM | POA: Diagnosis not present

## 2019-03-18 DIAGNOSIS — I890 Lymphedema, not elsewhere classified: Secondary | ICD-10-CM

## 2019-03-18 DIAGNOSIS — I872 Venous insufficiency (chronic) (peripheral): Secondary | ICD-10-CM | POA: Diagnosis not present

## 2019-03-18 DIAGNOSIS — E669 Obesity, unspecified: Secondary | ICD-10-CM | POA: Diagnosis present

## 2019-03-18 DIAGNOSIS — E1165 Type 2 diabetes mellitus with hyperglycemia: Secondary | ICD-10-CM | POA: Diagnosis not present

## 2019-03-18 DIAGNOSIS — I10 Essential (primary) hypertension: Secondary | ICD-10-CM | POA: Diagnosis present

## 2019-03-18 DIAGNOSIS — E1142 Type 2 diabetes mellitus with diabetic polyneuropathy: Secondary | ICD-10-CM | POA: Diagnosis present

## 2019-03-18 DIAGNOSIS — E1122 Type 2 diabetes mellitus with diabetic chronic kidney disease: Secondary | ICD-10-CM | POA: Diagnosis present

## 2019-03-18 DIAGNOSIS — K8689 Other specified diseases of pancreas: Secondary | ICD-10-CM | POA: Diagnosis present

## 2019-03-18 DIAGNOSIS — J849 Interstitial pulmonary disease, unspecified: Secondary | ICD-10-CM | POA: Diagnosis present

## 2019-03-18 DIAGNOSIS — R002 Palpitations: Secondary | ICD-10-CM | POA: Diagnosis not present

## 2019-03-18 HISTORY — DX: COVID-19: U07.1

## 2019-03-18 LAB — COMPREHENSIVE METABOLIC PANEL
ALT: 24 U/L (ref 0–44)
AST: 21 U/L (ref 15–41)
Albumin: 4.2 g/dL (ref 3.5–5.0)
Alkaline Phosphatase: 85 U/L (ref 38–126)
Anion gap: 12 (ref 5–15)
BUN: 17 mg/dL (ref 8–23)
CO2: 25 mmol/L (ref 22–32)
Calcium: 9.2 mg/dL (ref 8.9–10.3)
Chloride: 104 mmol/L (ref 98–111)
Creatinine, Ser: 0.82 mg/dL (ref 0.44–1.00)
GFR calc Af Amer: >60 mL/min (ref >60)
GFR calc non Af Amer: >60 mL/min (ref >60)
Glucose, Bld: 233 mg/dL — ABNORMAL HIGH (ref 70–99)
Potassium: 3.9 mmol/L (ref 3.5–5.1)
Sodium: 141 mmol/L (ref 135–145)
Total Bilirubin: 0.8 mg/dL (ref 0.3–1.2)
Total Protein: 7.5 g/dL (ref 6.5–8.1)

## 2019-03-18 LAB — CBC WITH DIFFERENTIAL/PLATELET
Abs Immature Granulocytes: 0.04 10*3/uL (ref 0.00–0.07)
Basophils Absolute: 0.1 10*3/uL (ref 0.0–0.1)
Basophils Relative: 1 %
Eosinophils Absolute: 0.7 10*3/uL — ABNORMAL HIGH (ref 0.0–0.5)
Eosinophils Relative: 7 %
HCT: 40 % (ref 36.0–46.0)
Hemoglobin: 13.4 g/dL (ref 12.0–15.0)
Immature Granulocytes: 0 %
Lymphocytes Relative: 21 %
Lymphs Abs: 2 10*3/uL (ref 0.7–4.0)
MCH: 29.1 pg (ref 26.0–34.0)
MCHC: 33.5 g/dL (ref 30.0–36.0)
MCV: 86.8 fL (ref 80.0–100.0)
Monocytes Absolute: 0.7 10*3/uL (ref 0.1–1.0)
Monocytes Relative: 8 %
Neutro Abs: 5.9 10*3/uL (ref 1.7–7.7)
Neutrophils Relative %: 63 %
Platelets: 216 10*3/uL (ref 150–400)
RBC: 4.61 MIL/uL (ref 3.87–5.11)
RDW: 13.2 % (ref 11.5–15.5)
WBC: 9.4 10*3/uL (ref 4.0–10.5)
nRBC: 0 % (ref 0.0–0.2)

## 2019-03-18 LAB — MAGNESIUM: Magnesium: 1.5 mg/dL — ABNORMAL LOW (ref 1.7–2.4)

## 2019-03-18 LAB — GLUCOSE, CAPILLARY
Glucose-Capillary: 186 mg/dL — ABNORMAL HIGH (ref 70–99)
Glucose-Capillary: 172 mg/dL — ABNORMAL HIGH (ref 70–99)

## 2019-03-18 LAB — PROTIME-INR
INR: 1 (ref 0.8–1.2)
Prothrombin Time: 12.7 seconds (ref 11.4–15.2)

## 2019-03-18 LAB — BRAIN NATRIURETIC PEPTIDE: B Natriuretic Peptide: 242 pg/mL — ABNORMAL HIGH (ref 0.0–100.0)

## 2019-03-18 LAB — APTT: aPTT: 31 seconds (ref 24–36)

## 2019-03-18 LAB — LIPASE, BLOOD: Lipase: 20 U/L (ref 11–51)

## 2019-03-18 MED ORDER — HEPARIN (PORCINE) 25000 UT/250ML-% IV SOLN
1100.0000 [IU]/h | INTRAVENOUS | Status: DC
  Administered 2019-03-18 – 2019-03-19 (×2): 1100 [IU]/h via INTRAVENOUS
  Filled 2019-03-18 (×2): qty 250

## 2019-03-18 MED ORDER — METOPROLOL TARTRATE 5 MG/5ML IV SOLN
2.5000 mg | INTRAVENOUS | Status: AC
  Filled 2019-03-18: qty 5

## 2019-03-18 MED ORDER — SIMVASTATIN 20 MG PO TABS
10.0000 mg | ORAL_TABLET | Freq: Every day | ORAL | Status: DC
  Administered 2019-03-19 – 2019-03-21 (×3): 10 mg via ORAL
  Filled 2019-03-18 (×4): qty 1

## 2019-03-18 MED ORDER — DILTIAZEM HCL 100 MG IV SOLR
5.0000 mg/h | INTRAVENOUS | Status: DC
  Administered 2019-03-18: 15 mg/h via INTRAVENOUS
  Filled 2019-03-18: qty 100

## 2019-03-18 MED ORDER — PIOGLITAZONE HCL 15 MG PO TABS
15.0000 mg | ORAL_TABLET | Freq: Every day | ORAL | Status: DC
  Administered 2019-03-19 – 2019-03-22 (×4): 15 mg via ORAL
  Filled 2019-03-18 (×5): qty 1

## 2019-03-18 MED ORDER — SODIUM CHLORIDE 0.9% FLUSH: 3.0000 mL | INTRAVENOUS | Status: DC | PRN

## 2019-03-18 MED ORDER — ONDANSETRON HCL 4 MG/2ML IJ SOLN: 4.0000 mg | Freq: Four times a day (QID) | INTRAMUSCULAR | Status: DC | PRN

## 2019-03-18 MED ORDER — MAGNESIUM OXIDE 400 (241.3 MG) MG PO TABS
400.0000 mg | ORAL_TABLET | Freq: Every day | ORAL | Status: DC
  Administered 2019-03-19 – 2019-03-22 (×4): 400 mg via ORAL
  Filled 2019-03-18 (×5): qty 1

## 2019-03-18 MED ORDER — SODIUM CHLORIDE 0.9 % IV SOLN: 250.0000 mL | INTRAVENOUS | Status: DC | PRN

## 2019-03-18 MED ORDER — DILTIAZEM LOAD VIA INFUSION
10.0000 mg | Freq: Once | INTRAVENOUS | Status: AC
  Administered 2019-03-18: 10 mg via INTRAVENOUS
  Filled 2019-03-18: qty 10

## 2019-03-18 MED ORDER — CARVEDILOL 25 MG PO TABS: 25.0000 mg | ORAL_TABLET | Freq: Two times a day (BID) | ORAL | Status: DC

## 2019-03-18 MED ORDER — CARVEDILOL 12.5 MG PO TABS
12.5000 mg | ORAL_TABLET | Freq: Two times a day (BID) | ORAL | Status: DC
  Administered 2019-03-19 – 2019-03-21 (×4): 12.5 mg via ORAL
  Filled 2019-03-18 (×3): qty 1
  Filled 2019-03-18: qty 2
  Filled 2019-03-18 (×3): qty 1

## 2019-03-18 MED ORDER — METOPROLOL TARTRATE 25 MG PO TABS
25.0000 mg | ORAL_TABLET | Freq: Two times a day (BID) | ORAL | Status: DC
  Administered 2019-03-19: 11:00:00 25 mg via ORAL
  Filled 2019-03-18 (×2): qty 1

## 2019-03-18 MED ORDER — DILTIAZEM HCL 60 MG PO TABS
30.0000 mg | ORAL_TABLET | Freq: Four times a day (QID) | ORAL | Status: DC
  Administered 2019-03-18 – 2019-03-19 (×2): 30 mg via ORAL
  Filled 2019-03-18 (×2): qty 1

## 2019-03-18 MED ORDER — DILTIAZEM HCL 100 MG IV SOLR
5.0000 mg/h | INTRAVENOUS | Status: DC
  Administered 2019-03-18: 10 mg/h via INTRAVENOUS
  Filled 2019-03-18: qty 100

## 2019-03-18 MED ORDER — ACETAMINOPHEN 325 MG PO TABS
650.0000 mg | ORAL_TABLET | ORAL | Status: DC | PRN
  Administered 2019-03-19 – 2019-03-21 (×6): 650 mg via ORAL
  Filled 2019-03-18 (×7): qty 2

## 2019-03-18 MED ORDER — PANCRELIPASE (LIP-PROT-AMYL) 12000-38000 UNITS PO CPEP
36000.0000 [IU] | ORAL_CAPSULE | Freq: Three times a day (TID) | ORAL | Status: DC
  Administered 2019-03-18 – 2019-03-22 (×11): 36000 [IU] via ORAL
  Filled 2019-03-18 (×15): qty 3

## 2019-03-18 MED ORDER — MOMETASONE FURO-FORMOTEROL FUM 200-5 MCG/ACT IN AERO
2.0000 | INHALATION_SPRAY | Freq: Two times a day (BID) | RESPIRATORY_TRACT | Status: DC
  Filled 2019-03-18: qty 8.8

## 2019-03-18 MED ORDER — MAGNESIUM SULFATE 2 GM/50ML IV SOLN
2.0000 g | Freq: Once | INTRAVENOUS | Status: AC
  Administered 2019-03-18: 2 g via INTRAVENOUS
  Filled 2019-03-18: qty 50

## 2019-03-18 MED ORDER — HEPARIN BOLUS VIA INFUSION
3700.0000 [IU] | Freq: Once | INTRAVENOUS | Status: AC
  Administered 2019-03-18: 12:00:00 3700 [IU] via INTRAVENOUS
  Filled 2019-03-18: qty 3700

## 2019-03-18 MED ORDER — LISINOPRIL 20 MG PO TABS
20.0000 mg | ORAL_TABLET | Freq: Every day | ORAL | Status: DC
  Administered 2019-03-19 – 2019-03-22 (×4): 20 mg via ORAL
  Filled 2019-03-18: qty 1
  Filled 2019-03-18: qty 2
  Filled 2019-03-18 (×2): qty 1

## 2019-03-18 MED ORDER — SODIUM CHLORIDE 0.9% FLUSH
3.0000 mL | Freq: Two times a day (BID) | INTRAVENOUS | Status: DC
  Administered 2019-03-19 – 2019-03-22 (×7): 3 mL via INTRAVENOUS

## 2019-03-18 MED ORDER — INSULIN ASPART 100 UNIT/ML ~~LOC~~ SOLN: 0.0000 [IU] | Freq: Every day | SUBCUTANEOUS | Status: DC

## 2019-03-18 MED ORDER — HYDROCHLOROTHIAZIDE 25 MG PO TABS: 25.0000 mg | ORAL_TABLET | Freq: Every day | ORAL | Status: DC

## 2019-03-18 MED ORDER — INSULIN ASPART 100 UNIT/ML ~~LOC~~ SOLN
0.0000 [IU] | Freq: Three times a day (TID) | SUBCUTANEOUS | Status: DC
  Administered 2019-03-18: 3 [IU] via SUBCUTANEOUS
  Administered 2019-03-19: 13:00:00 5 [IU] via SUBCUTANEOUS
  Administered 2019-03-19: 11:00:00 3 [IU] via SUBCUTANEOUS
  Filled 2019-03-18 (×3): qty 1

## 2019-03-18 MED ORDER — INSULIN GLARGINE 100 UNIT/ML ~~LOC~~ SOLN
30.0000 [IU] | Freq: Every day | SUBCUTANEOUS | Status: DC
  Administered 2019-03-19 – 2019-03-22 (×4): 30 [IU] via SUBCUTANEOUS
  Filled 2019-03-18 (×5): qty 0.3

## 2019-03-18 MED ORDER — FLUTICASONE FUROATE-VILANTEROL 200-25 MCG/INH IN AEPB
1.0000 | INHALATION_SPRAY | Freq: Every day | RESPIRATORY_TRACT | Status: DC
  Filled 2019-03-18: qty 28

## 2019-03-18 NOTE — H&P (Signed)
History and Physical    Beth Tyler TDS:[id number] DOB: 1946/03/26 DOA: 03/18/2019  **Will admit patient based on the expectation that the patient will need hospitalization/ hospital care that crosses at least 2 midnights  PCP: Einar Pheasant, MD   Attending physician: Renne Crigler  Patient coming from/Resides with: Private residence  Chief Complaint: Palpitations noted upon awakening  HPI: Beth Tyler is a 73 y.o. 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  Patient awakened this morning with a sensation of rapid palpitations.  Presented to the ER where she was found to be in new onset atrial fibrillation with ventricular rates in the 120s of 140 range.  She has been bolused with Cardizem and is currently on Cardizem infusion with resting heart rate still in the 120s.  She has been started on IV heparin due to CHA2DS2-VASc score greater than 4.  She has been evaluated by cardiology recommendation to transition over to Eliquis after discharge and obtain echocardiogram once tachycardia resolved.  She has been continued on preadmission lisinopril and carvedilol.  Upon my evaluation she is still having awareness of palpitations but denies shortness of breath or chest pain.  Prior to onset of today symptoms she has not had any palpitations, chest pain, shortness of breath or dyspnea on exertion.  She has recently had her carvedilol decreased from 25 mg to 12.5 mg and her lisinopril also decreased by half previous dose.  She has a history of chronic nausea and abdominal pain due to pancreatic insufficiency and just recently has been able to procure [name]-she is receiving low cost or charity dosing through the pharmacy-manufacturer.  In the ER patient's chest x-ray  was unremarkable.  Patient was not hypotensive with her tachycardia.  Her sugar was 233.  Renal function was stable and at baseline.  Magnesium was low at 1.5 so EDP has given 2 g bolus.  ED Course:  Vital Signs: BP (!) 146/95   Pulse (!) 107   Temp 98.5 F (36.9 C)   Resp 20   Ht 5' 2"  (1.575 m)   Wt 98 kg   SpO2 96%   BMI 39.51 kg/m   Review of Systems:  In addition to the HPI above,  No Fever-chills, myalgias or other constitutional symptoms No Headache, changes with Vision or hearing, new weakness, tingling, numbness in any extremity, dizziness, dysarthria or word finding difficulty, gait disturbance or imbalance, tremors or seizure activity No problems swallowing food or Liquids, indigestion/reflux, choking or coughing while eating, abdominal pain with or after eating No Chest pain, Cough or Shortness of Breath, + onset of palpitations upon awakening this morning, no orthopnea or DOE No Abdominal pain, N/V, melena,hematochezia, dark tarry stools, constipation No dysuria, malodorous urine, hematuria or flank pain No new skin rashes, lesions, masses or bruises, No new joint pains, aches, swelling or redness No recent unintentional weight gain or loss No polyuria, polydypsia or polyphagia   Past Medical History:  Diagnosis Date  . Anemia   . Arthritis   . B12 deficiency   . Carpal tunnel syndrome, bilateral   . Diabetes mellitus ([place]) 02/17/2012  . Diabetes mellitus, type II ([place])   . Dyspnea   . Gastritis   . GERD (gastroesophageal reflux disease)   . Hypercholesterolemia   . Hypertension   . IBS (irritable bowel syndrome)   .  Neuropathy    legs  . Pernicious anemia   . Vertigo    no episodes fo 9 or 10 yrs    Past Surgical History:  Procedure Laterality Date  . ANTERIOR VITRECTOMY Right 12/01/2017   Procedure: ANTERIOR VITRECTOMY;  Surgeon: Eulogio Bear, MD;  Location: [facility];  Service: Ophthalmology;  Laterality: Right;  . BREAST BIOPSY  Right 1980   neg  . BREAST SURGERY  1980   benign cyst removal  . CATARACT EXTRACTION W/PHACO Left 10/13/2017   Procedure: CATARACT EXTRACTION PHACO AND INTRAOCULAR LENS PLACEMENT ([place])  LEFT  DIABETES;  Surgeon: Eulogio Bear, MD;  Location: [facility];  Service: Ophthalmology;  Laterality: Left;  Diabetic - insulin and oral meds  . CATARACT EXTRACTION W/PHACO Right 12/01/2017   Procedure: CATARACT EXTRACTION PHACO AND INTRAOCULAR LENS PLACEMENT ([place])  RIGHT DIABETIC;  Surgeon: Eulogio Bear, MD;  Location: [facility];  Service: Ophthalmology;  Laterality: Right;  Diabetic - insulin and oral  . CHOLECYSTECTOMY  1983    Social History   Socioeconomic History  . Marital status: Widowed    Spouse name: Not on file  . Number of children: 4  . Years of education: Not on file  . Highest education level: Not on file  Occupational History  . Not on file  Tobacco Use  . Smoking status: Never Smoker  . Smokeless tobacco: Never Used  Substance and Sexual Activity  . Alcohol use: No    Alcohol/week: 0.0 standard drinks  . Drug use: No  . Sexual activity: Not Currently  Other Topics Concern  . Not on file  Social History Narrative   Widowed. Has four children. Manager of an apartment building. No tobacco, alcohol or other drug use   Social Determinants of Health   Financial Resource Strain: Low Risk   . Difficulty of Paying Living Expenses: Not hard at all  Food Insecurity: No Food Insecurity  . Worried About Charity fundraiser in the Last Year: Never true  . Ran Out of Food in the Last Year: Never true  Transportation Needs: No Transportation Needs  . Lack of Transportation (Medical): No  . Lack of Transportation (Non-Medical): No  Physical Activity:   . Days of Exercise per Week: Not on file  . Minutes of Exercise per Session: Not on file  Stress: No Stress Concern Present  . Feeling of Stress : Not at all  Social Connections: Slightly Isolated  .  Frequency of Communication with Friends and Family: More than three times a week  . Frequency of Social Gatherings with Friends and Family: More than three times a week  . Attends Religious Services: 1 to 4 times per year  . Active Member of Clubs or Organizations: Yes  . Attends Archivist Meetings: 1 to 4 times per year  . Marital Status: Widowed  Intimate Partner Violence:   . Fear of Current or Ex-Partner: Not on file  . Emotionally Abused: Not on file  . Physically Abused: Not on file  . Sexually Abused: Not on file    Mobility: Utilizes a rolling walker when mobilizing for long distances Work history: Currently not working   Allergies  Allergen Reactions  . Hydrocodone-Acetaminophen Anaphylaxis  . Percocet [Oxycodone-Acetaminophen] Anaphylaxis  . Demeclocycline Nausea Only  . Penicillins Nausea Only    Did it involve swelling of the face/tongue/throat, SOB, or low BP? No Did it involve sudden or severe rash/hives, skin peeling, or any reaction  on the inside of your mouth or nose? No Did you need to seek medical attention at a hospital or doctor's office? No When did it last happen? If all above answers are "NO", may proceed with cephalosporin use.  . Tetracyclines & Related Nausea Only    Family History  Problem Relation Age of Onset  . Diabetes Father   . Kidney disease Father   . Heart disease Father   . Alcohol abuse Mother   . Dementia Mother   . Diabetes Mother   . Diabetes Other   . Heart disease Son 80       CABG  . Hemophilia Brother   . Uterine cancer Sister   . Diabetes Sister   . COPD Sister   . Breast cancer Maternal Aunt 51  . Breast cancer Daughter 14  . Stroke Daughter    Family history reviewed as above  Prior to Admission medications   Medication Sig Start Date End Date Taking? Authorizing Provider  acetaminophen (TYLENOL) 500 MG tablet Take 500 mg by mouth every 6 (six) hours as needed.    [provider]    amLODipine (NORVASC) 10 MG tablet Take 1 tablet by mouth once daily 03/17/19   Einar Pheasant, MD  Blood Glucose Monitoring Suppl ([place]) w/Device KIT  07/05/17   [provider]  carvedilol (COREG) 25 MG tablet TAKE 1 TABLET BY MOUTH TWICE DAILY WITH A MEAL 02/25/19   Einar Pheasant, MD  fluticasone (CUTIVATE) 0.05 % cream USE AS DIRECTED 09/15/18   Einar Pheasant, MD  fluticasone furoate-vilanterol (BREO ELLIPTA) 200-25 MCG/INH AEPB Inhale 1 puff into the lungs daily. 01/01/18   Laverle Hobby, MD  Fluticasone-Salmeterol (ADVAIR DISKUS) 250-50 MCG/DOSE AEPB Inhale 1 puff into the lungs 2 (two) times daily. 10/29/17   Laverle Hobby, MD  hydrochlorothiazide (HYDRODIURIL) 25 MG tablet Take 1 tablet (25 mg total) by mouth daily. 08/04/18   Einar Pheasant, MD  Insulin Glargine (BASAGLAR KWIKPEN) 100 UNIT/ML SOPN Inject 0.3 mLs (30 Units total) into the skin daily. 06/15/18   Einar Pheasant, MD  insulin lispro ([name]) 100 UNIT/ML injection Inject up to 10 units TID with meals 01/21/19   Einar Pheasant, MD  Insulin Pen Needle 32G X 4 MM MISC Use to inject insulin three times daily 07/20/18   Einar Pheasant, MD  lipase/protease/amylase ([name]) 36000 UNITS CPEP capsule Take by mouth. 2 capsules TID w/ meals and 1 w/ snack 02/08/19 02/08/20  [provider]  lisinopril (ZESTRIL) 20 MG tablet TAKE 1 TABLET BY MOUTH IN THE MORNING AND 2 AT BEDTIME 02/04/19   Einar Pheasant, MD  magnesium oxide (MAG-OX) 400 (241.3 Mg) MG tablet Take 1 tablet by mouth once daily 03/17/19   Einar Pheasant, MD  metFORMIN (GLUCOPHAGE-XR) 500 MG 24 hr tablet Take 2 tablets by mouth twice daily 03/17/19   Einar Pheasant, MD  [facility] LANCETS 53Z [place]  07/07/17   [provider]  [facility] VERIO test strip USE 1 STRIP TO CHECK GLUCOSE TWICE DAILY 01/18/19   Einar Pheasant, MD  pantoprazole (PROTONIX) 40 MG tablet TAKE 1 TABLET BY MOUTH TWICE DAILY BEFORE MEAL(S)  09/01/18   Einar Pheasant, MD  pioglitazone (ACTOS) 15 MG tablet Take 1 tablet by mouth once daily 02/23/19   Einar Pheasant, MD  simvastatin (ZOCOR) 10 MG tablet TAKE 1 TABLET BY MOUTH AT BEDTIME 03/17/19   Einar Pheasant, MD  triamcinolone cream (KENALOG) 0.1 % APPLY  CREAM TOPICALLY TWICE DAILY 03/17/19  Einar Pheasant, MD    Physical Exam: Vitals:   03/18/19 1330 03/18/19 1333 03/18/19 1430 03/18/19 1500  BP: 121/77  (!) 124/94 (!) 146/95  Pulse: (!) 145 (!) 133 90 (!) 107  Resp: 13 20 11 20   Temp:      TempSrc:      SpO2: 94% 98% 96% 96%  Weight:      Height:          Constitutional: NAD, calm, comfortable Eyes: PERRL, lids and conjunctivae normal ENMT: Mucous membranes are moist. Posterior pharynx clear of any exudate or lesions.Normal dentition.  Neck: normal, supple, no masses, no thyromegaly Respiratory: clear to auscultation bilaterally, no wheezing, no crackles. Normal respiratory effort. No accessory muscle use.  Cardiovascular: Iregular rate underlying atrial fibrillation rhythm ventricular response resting in the 110s to 120s, no murmurs / rubs / gallops.  Chronic brawny appearing lower extremity edema in context of known lymphedema. 2+ pedal pulses. No carotid bruits.  Abdomen: no tenderness, no masses palpated. No hepatosplenomegaly. Bowel sounds positive.  Musculoskeletal: no clubbing / cyanosis. No joint deformity upper and lower extremities. Good ROM, no contractures. Normal muscle tone.  Skin: no rashes, lesions, ulcers. No induration-patient has chronic mild erythematous stocking-like changes to the lower extremities from mid calf to ankles with dried multiple ulcerations in nonspecific pattern which is chronic related to her lymphedema Neurologic: CN 2-12 grossly intact. Sensation intact, DTR normal. Strength 5/5 x all 4 extremities.  Psychiatric: Normal judgment and insight. Alert and oriented x 3. Normal mood.    Labs on Admission: I have personally  reviewed following labs and imaging studies  CBC: Recent Labs  Lab 03/18/19 1059  WBC 9.4  NEUTROABS 5.9  HGB 13.4  HCT 40.0  MCV 86.8  PLT 086   Basic Metabolic Panel: Recent Labs  Lab 03/12/19 1017 03/18/19 1059  NA 140 141  K 4.2 3.9  CL 102 104  CO2 27 25  GLUCOSE 155* 233*  BUN 19 17  CREATININE 0.94 0.82  CALCIUM 9.6 9.2  MG  --  1.5*   GFR: Estimated Creatinine Clearance: 65.8 mL/min (by C-G formula based on SCr of 0.82 mg/dL). Liver Function Tests: Recent Labs  Lab 03/12/19 1017 03/18/19 1059  AST 17 21  ALT 22 24  ALKPHOS 88 85  BILITOT 0.7 0.8  PROT 6.7 7.5  ALBUMIN 4.3 4.2   Recent Labs  Lab 03/18/19 1059  LIPASE 20   No results for input(s): AMMONIA in the last 168 hours. Coagulation Profile: Recent Labs  Lab 03/18/19 1053  INR 1.0   Cardiac Enzymes: No results for input(s): CKTOTAL, CKMB, CKMBINDEX, TROPONINI in the last 168 hours. BNP (last 3 results) No results for input(s): PROBNP in the last 8760 hours. HbA1C: No results for input(s): HGBA1C in the last 72 hours. CBG: No results for input(s): GLUCAP in the last 168 hours. Lipid Profile: No results for input(s): CHOL, HDL, LDLCALC, TRIG, CHOLHDL, LDLDIRECT in the last 72 hours. Thyroid Function Tests: No results for input(s): TSH, T4TOTAL, FREET4, T3FREE, THYROIDAB in the last 72 hours. Anemia Panel: No results for input(s): VITAMINB12, FOLATE, FERRITIN, TIBC, IRON, RETICCTPCT in the last 72 hours. Urine analysis:    Component Value Date/Time   COLORURINE STRAW (A) 12/03/2017 1732   APPEARANCEUR CLEAR 12/03/2017 1732   LABSPEC 1.015 12/03/2017 1732   PHURINE 6.5 12/03/2017 1732   GLUCOSEU NEGATIVE 12/03/2017 1732   HGBUR TRACE (A) 12/03/2017 1732   BILIRUBINUR NEGATIVE 12/03/2017 1732   KETONESUR  NEGATIVE 12/03/2017 1732   PROTEINUR 100 (A) 12/03/2017 1732   NITRITE NEGATIVE 12/03/2017 1732   LEUKOCYTESUR NEGATIVE 12/03/2017 1732   Sepsis  Labs: @LABRCNTIP (procalcitonin:4,lacticidven:4) )No results found for this or any previous visit (from the past 240 hour(s)).   Radiological Exams on Admission: DG Chest Portable 1 View  Result Date: 03/18/2019 CLINICAL DATA:  Palpitations, new onset AFib, SOB. EXAM: PORTABLE CHEST 1 VIEW COMPARISON:  No prior. FINDINGS: Mediastinum and hilar structures normal. Lungs are clear. No pleural effusion or pneumothorax. IMPRESSION: No acute cardiopulmonary disease.  Exam stable from prior exam. Electronically Signed   By: [place]   On: 03/18/2019 11:17    EKG: (Independently reviewed) atrial fibrillation with ventricular response 123 bpm, QTC 318 ms, no acute ischemic changes, normal R wave rotation  Assessment/Plan Principal Problem:   New onset atrial fibrillation (HCC) Active Problems:   Hypertension   Chronic hyperkalemia   CKD (chronic kidney disease), stage III   Controlled type 2 diabetes mellitus with hyperglycemia (HCC)   Diabetes-pancreatic exocrine dysfunction syndrome (HCC)   ILD (interstitial lung disease) ([place])   Hyperlipidemia   BMI 40.0-44.9, adult (HCC)   Lymphedema   Chronic stasis dermatitis   New onset atrial fibrillation with RVR  -Presents as above with awareness of palpitations and current ongoing tachycardia -Appreciate cardiology assistance -Continue some infusion currently at 10 mg/h and titrate up to a maximum of 20 mg -Continue preadmission carvedilol 12.5 mg twice daily-may need to increase to 25 twice daily if BP will tolerate -Keep potassium >/= 4.0 and keep magnesium greater than 2.0 -Continue IV heparin-cardiology prefers transitioning to Eliquis-we will need case management to confirm affordability of co-pay prior to transitioning -Check TSH -Cardiology recommends echocardiogram once tachycardia resolves; last echocardiogram was May 2019 with preserved LVEF and mild to moderate TR -BNP slightly elevated at 242 given lack of respiratory  symptoms and normal chest x-ray is more likely reflective of demand issues from tachycardia  Hypertension -Currently controlled -Continue home regimen of carvedilol and lisinopril but hold HCTZ and Norvasc acutely  CKD 3 with chronic hyperkalemia/?  Evolving diabetic nephropathy -Current creatinine at baseline of 0.82 with GFR normal currently -In February creatinine was much higher with an abnormal GFR but has since recovered -Continue to follow labs -Renal ultrasound in April and straight any medical renal disease  Type 2 diabetes mellitus with hyperglycemia -Hemoglobin A1c earlier in 2020 was 8.5-we will repeat this admission -Continue preadmission Basaglar and Actos-may need to choose alternative drug regarding Actos if does have mild CHF physiology on echo this admission -Current glucose 233 at presentation -Follow CBGs and provide SSI -Hold preadmission metformin  Pancreatic insufficiency/diabetes pancreatic exocrine dysfunction syndrome -Therapeutic substitution for preadmission [name]  Interstitial lung disease -Therapeutic substitution of Dulera for Advair and continue Breo Ellipta -Not chronically hypoxemic and currently stable on room air  Chronic lymphedema and stasis dermatitis -Lower extremity stable and at baseline per patient   HLD -Continue preadmission Zocor -Lipid panel 12/4 total cholesterol 114, HDL cholesterol 45, LDL cholesterol 37 and triglycerides 160  Obesity with BMI greater than 40 -Weight reduction strategies per PCP  **Additional lab, imaging and/or diagnostic evaluation at discretion of supervising physician  DVT prophylaxis: IV heparin Code Status: Full Family Communication: Patient and family member at bedside Disposition Plan: Inpatient, presents with new onset atrial fibrillation with RVR requiring IV Cardizem infusion, close monitoring of electrolytes and telemetry.  No chest pain so at this time ischemic cardiovascular evaluation not  indicated.  Also requiring inpatient cardiology consultation and echocardiogram once ventricular rate controlled. Consults called: Cardiology    Erin Hearing ANP-BC [facility] Pager [phone]   If 7PM-7AM, please contact night-coverage [email or web address] Password [email or web address]  03/18/2019, 3:15 PM

## 2019-03-18 NOTE — ED Provider Notes (Signed)
[facility] Emergency Department Provider Note  ____________________________________________   First MD Initiated Contact with Patient 03/18/19 1036     (approximate)  I have reviewed the triage vital signs and the nursing notes.   HISTORY  Chief Complaint Palpitations    HPI Beth Tyler is a 73 y.o. female  With PMHx HTN, DM, IBS, here with palpitations.Pt reports that she awoke this AM and turned in bed to get up for the bathroom. After doing so, she experienced acute onset of palpitations and sensation of her heart beating very quickly. She checked her HR on a home monitor and it was in the 150-130s, highly irregular. She subsequently called EMS. She states she no longer feels palpitations now. She felt somewhat weak but is adamant she had no SOB or chest pain. No h/o AFIb. Of note, she has recently been seen by her GI doc for n/v and poor appetite, for which she was recently started on [name]. No fever, chills, or recent infectious sx.       Past Medical History:  Diagnosis Date  . Anemia   . Arthritis   . B12 deficiency   . Carpal tunnel syndrome, bilateral   . Diabetes mellitus ([place]) 02/17/2012  . Diabetes mellitus, type II ([place])   . Dyspnea   . Gastritis   . GERD (gastroesophageal reflux disease)   . Hypercholesterolemia   . Hypertension   . IBS (irritable bowel syndrome)   . Neuropathy    legs  . Pernicious anemia   . Vertigo    no episodes fo 9 or 10 yrs    Patient Active Problem List   Diagnosis Date Noted  . CKD (chronic kidney disease), stage III 08/08/2018  . GERD (gastroesophageal reflux disease) 08/08/2018  . Snoring 05/24/2018  . Arm numbness 07/06/2017  . Otitis externa 07/06/2017  . Aortic atherosclerosis ([place]) 10/22/2016  . Nausea 10/09/2016  . Lymphedema 03/11/2016  . SOB (shortness of breath) 02/25/2016  . Pain in limb 02/02/2016  . Back pain 05/23/2015  . Cough 02/13/2015  . BMI 40.0-44.9, adult ([place])  08/10/2014  . Bilateral leg edema 08/10/2014  . Chest pain 07/17/2014  . Health care maintenance 07/17/2014  . Colon cancer screening 01/19/2014  . Stress 06/14/2013  . Hyperkalemia 03/21/2013  . IBS (irritable bowel syndrome) 02/17/2012  . Hypertension 02/17/2012  . Hyperlipidemia 02/17/2012  . Diabetes mellitus ([place]) 02/17/2012  . B12 deficiency 02/17/2012    Past Surgical History:  Procedure Laterality Date  . ANTERIOR VITRECTOMY Right 12/01/2017   Procedure: ANTERIOR VITRECTOMY;  Surgeon: Eulogio Bear, MD;  Location: [place];  Service: Ophthalmology;  Laterality: Right;  . BREAST BIOPSY Right 1980   neg  . BREAST SURGERY  1980   benign cyst removal  . CATARACT EXTRACTION W/PHACO Left 10/13/2017   Procedure: CATARACT EXTRACTION PHACO AND INTRAOCULAR LENS PLACEMENT ([place])  LEFT  DIABETES;  Surgeon: Eulogio Bear, MD;  Location: [place];  Service: Ophthalmology;  Laterality: Left;  Diabetic - insulin and oral meds  . CATARACT EXTRACTION W/PHACO Right 12/01/2017   Procedure: CATARACT EXTRACTION PHACO AND INTRAOCULAR LENS PLACEMENT ([place])  RIGHT DIABETIC;  Surgeon: Eulogio Bear, MD;  Location: [place];  Service: Ophthalmology;  Laterality: Right;  Diabetic - insulin and oral  . CHOLECYSTECTOMY  1983    Prior to Admission medications   Medication Sig Start Date End Date Taking? Authorizing Provider  acetaminophen (TYLENOL) 500 MG tablet Take 500 mg by  mouth every 6 (six) hours as needed.    [provider]  amLODipine (NORVASC) 10 MG tablet Take 1 tablet by mouth once daily 03/17/19   Einar Pheasant, MD  Blood Glucose Monitoring Suppl ([place]) w/Device KIT  07/05/17   [provider]  carvedilol (COREG) 25 MG tablet TAKE 1 TABLET BY MOUTH TWICE DAILY WITH A MEAL 02/25/19   Einar Pheasant, MD  fluticasone (CUTIVATE) 0.05 % cream USE AS DIRECTED 09/15/18   Einar Pheasant, MD  fluticasone  furoate-vilanterol (BREO ELLIPTA) 200-25 MCG/INH AEPB Inhale 1 puff into the lungs daily. 01/01/18   Laverle Hobby, MD  Fluticasone-Salmeterol (ADVAIR DISKUS) 250-50 MCG/DOSE AEPB Inhale 1 puff into the lungs 2 (two) times daily. 10/29/17   Laverle Hobby, MD  hydrochlorothiazide (HYDRODIURIL) 25 MG tablet Take 1 tablet (25 mg total) by mouth daily. 08/04/18   Einar Pheasant, MD  Insulin Glargine (BASAGLAR KWIKPEN) 100 UNIT/ML SOPN Inject 0.3 mLs (30 Units total) into the skin daily. 06/15/18   Einar Pheasant, MD  insulin lispro ([name]) 100 UNIT/ML injection Inject up to 10 units TID with meals 01/21/19   Einar Pheasant, MD  Insulin Pen Needle 32G X 4 MM MISC Use to inject insulin three times daily 07/20/18   Einar Pheasant, MD  lipase/protease/amylase ([name]) 36000 UNITS CPEP capsule Take by mouth. 2 capsules TID w/ meals and 1 w/ snack 02/08/19 02/08/20  [provider]  lisinopril (ZESTRIL) 20 MG tablet TAKE 1 TABLET BY MOUTH IN THE MORNING AND 2 AT BEDTIME 02/04/19   Einar Pheasant, MD  magnesium oxide (MAG-OX) 400 (241.3 Mg) MG tablet Take 1 tablet by mouth once daily 03/17/19   Einar Pheasant, MD  metFORMIN (GLUCOPHAGE-XR) 500 MG 24 hr tablet Take 2 tablets by mouth twice daily 03/17/19   Einar Pheasant, MD  [facility] LANCETS 50Y [place]  07/07/17   [provider]  [facility] VERIO test strip USE 1 STRIP TO CHECK GLUCOSE TWICE DAILY 01/18/19   Einar Pheasant, MD  pantoprazole (PROTONIX) 40 MG tablet TAKE 1 TABLET BY MOUTH TWICE DAILY BEFORE MEAL(S) 09/01/18   Einar Pheasant, MD  pioglitazone (ACTOS) 15 MG tablet Take 1 tablet by mouth once daily 02/23/19   Einar Pheasant, MD  simvastatin (ZOCOR) 10 MG tablet TAKE 1 TABLET BY MOUTH AT BEDTIME 03/17/19   Einar Pheasant, MD  triamcinolone cream (KENALOG) 0.1 % APPLY  CREAM TOPICALLY TWICE DAILY 03/17/19   Einar Pheasant, MD    Allergies Hydrocodone-acetaminophen, Percocet [oxycodone-acetaminophen],  Demeclocycline, Penicillins, and Tetracyclines & related  Family History  Problem Relation Age of Onset  . Diabetes Father   . Kidney disease Father   . Heart disease Father   . Alcohol abuse Mother   . Dementia Mother   . Diabetes Mother   . Diabetes Other   . Heart disease Son 50       CABG  . Hemophilia Brother   . Uterine cancer Sister   . Diabetes Sister   . COPD Sister   . Breast cancer Maternal Aunt 51  . Breast cancer Daughter 34  . Stroke Daughter     Social History Social History   Tobacco Use  . Smoking status: Never Smoker  . Smokeless tobacco: Never Used  Substance Use Topics  . Alcohol use: No    Alcohol/week: 0.0 standard drinks  . Drug use: No    Review of Systems  Review of Systems  Constitutional: Positive for fatigue. Negative for fever.  HENT: Negative for  congestion and sore throat.   Eyes: Negative for visual disturbance.  Respiratory: Negative for cough and shortness of breath.   Cardiovascular: Positive for palpitations. Negative for chest pain.  Gastrointestinal: Negative for abdominal pain, diarrhea, nausea and vomiting.  Genitourinary: Negative for flank pain.  Musculoskeletal: Negative for back pain and neck pain.  Skin: Negative for rash and wound.  Neurological: Negative for weakness.  All other systems reviewed and are negative.    ____________________________________________  PHYSICAL EXAM:      VITAL SIGNS: ED Triage Vitals  Enc Vitals Group     BP 03/18/19 1031 (!) 162/100     Pulse Rate 03/18/19 1031 (!) 126     Resp 03/18/19 1031 18     Temp 03/18/19 1034 98.5 F (36.9 C)     Temp Source 03/18/19 1031 Oral     SpO2 03/18/19 1031 100 %     Weight 03/18/19 1032 216 lb (98 kg)     Height 03/18/19 1032 5' 2"  (1.575 m)     Head Circumference --      Peak Flow --      Pain Score 03/18/19 1032 0     Pain Loc --      Pain Edu? --      Excl. in [place]? --      Physical Exam Vitals and nursing note reviewed.   Constitutional:      General: She is not in acute distress.    Appearance: She is well-developed.  HENT:     Head: Normocephalic and atraumatic.  Eyes:     Conjunctiva/sclera: Conjunctivae normal.  Cardiovascular:     Rate and Rhythm: Tachycardia present. Rhythm irregularly irregular.     Heart sounds: Normal heart sounds. No murmur. No friction rub.  Pulmonary:     Effort: Pulmonary effort is normal. No respiratory distress.     Breath sounds: Normal breath sounds. No wheezing or rales.  Abdominal:     General: There is no distension.     Palpations: Abdomen is soft.     Tenderness: There is no abdominal tenderness.  Musculoskeletal:     Cervical back: Neck supple.  Skin:    General: Skin is warm.     Capillary Refill: Capillary refill takes less than 2 seconds.  Neurological:     Mental Status: She is alert and oriented to person, place, and time.     Motor: No abnormal muscle tone.       ____________________________________________   LABS (all labs ordered are listed, but only abnormal results are displayed)  Labs Reviewed  CBC WITH DIFFERENTIAL/PLATELET - Abnormal; Notable for the following components:      Result Value   Eosinophils Absolute 0.7 (*)    All other components within normal limits  COMPREHENSIVE METABOLIC PANEL - Abnormal; Notable for the following components:   Glucose, Bld 233 (*)    All other components within normal limits  MAGNESIUM - Abnormal; Notable for the following components:   Magnesium 1.5 (*)    All other components within normal limits  BRAIN NATRIURETIC PEPTIDE - Abnormal; Notable for the following components:   B Natriuretic Peptide 242.0 (*)    All other components within normal limits  SARS CORONAVIRUS 2 (TAT 6-24 HRS)  LIPASE, BLOOD  PROTIME-INR  APTT    ____________________________________________  EKG: Atrial fibrillation with rapid ventricular response, VR 123. Borerline repol abnormality. QRS narrow at 93. When  compared to prior, AFib has replaced NSR. ________________________________________  RADIOLOGY All  imaging, including plain films, CT scans, and ultrasounds, independently reviewed by me, and interpretations confirmed via formal radiology reads.  ED MD interpretation:   CXR: Clear  Official radiology report(s): DG Chest Portable 1 View  Result Date: 03/18/2019 CLINICAL DATA:  Palpitations, new onset AFib, SOB. EXAM: PORTABLE CHEST 1 VIEW COMPARISON:  No prior. FINDINGS: Mediastinum and hilar structures normal. Lungs are clear. No pleural effusion or pneumothorax. IMPRESSION: No acute cardiopulmonary disease.  Exam stable from prior exam. Electronically Signed   By: [place]   On: 03/18/2019 11:17    ____________________________________________  PROCEDURES   Procedure(s) performed (including Critical Care):  .Critical Care Performed by: Duffy Bruce, MD Authorized by: Duffy Bruce, MD   Critical care provider statement:    Critical care time (minutes):  35   Critical care time was exclusive of:  Separately billable procedures and treating other patients and teaching time   Critical care was necessary to treat or prevent imminent or life-threatening deterioration of the following conditions:  Cardiac failure and circulatory failure   Critical care was time spent personally by me on the following activities:  Development of treatment plan with patient or surrogate, discussions with consultants, evaluation of patient's response to treatment, examination of patient, obtaining history from patient or surrogate, ordering and performing treatments and interventions, ordering and review of laboratory studies, ordering and review of radiographic studies, pulse oximetry, re-evaluation of patient's condition and review of old charts   I assumed direction of critical care for this patient from another provider in my specialty: no      ____________________________________________   INITIAL IMPRESSION / MDM / [place] / ED COURSE  As part of my medical decision making, I reviewed the following data within the [place] notes reviewed and incorporated, Old chart reviewed, Notes from prior ED visits, and [place] Controlled Substance Database       *Beth Tyler was evaluated in Emergency Department on 03/18/2019 for the symptoms described in the history of present illness. She was evaluated in the context of the global COVID-19 pandemic, which necessitated consideration that the patient might be at risk for infection with the SARS-CoV-2 virus that causes COVID-19. Institutional protocols and algorithms that pertain to the evaluation of patients at risk for COVID-19 are in a state of rapid change based on information released by regulatory bodies including the CDC and federal and state organizations. These policies and algorithms were followed during the patient's care in the ED.  Some ED evaluations and interventions may be delayed as a result of limited staffing during the pandemic.*  Clinical Course as of Mar 17 1202  Thu Mar 17, 5549  [id number] 73 year old female here with new onset atrial fibrillation with rapid ventricular response.  Lab work shows hypomagnesium but is otherwise largely unremarkable. BNP mildly elevated. Will start on heparin and diltiazem. Admit to medicine. Dr. Ubaldo Glassing aware with [facility] - pt sees Dr. Saralyn Pilar primarily. They will see as inpt.    [CI]    Clinical Course User Index [CI] Duffy Bruce, MD    Medical Decision Making:  As above.   [id number] Score for Atrial Fibrillation    Patient Score  Age <65 = 0 65-74 = 1 > 75 = 2 1  Sex Female = 0 Female = 1 1  CHF History No = 0  Yes = 1 0  HTN History No = 0  Yes = 1 1  Stroke/TIA/TE History No = 0  Yes = 1 0  Vascular Disease History No = 0  Yes = 1 0  Diabetes History No = 0  Yes = 1 1  Total:  4   4.8 % stroke rate/year from a score of 4   ____________________________________________  FINAL CLINICAL IMPRESSION(S) / ED DIAGNOSES  Final diagnoses:  New onset atrial fibrillation (HCC)  Atrial fibrillation with RVR (HCC)     MEDICATIONS GIVEN DURING THIS VISIT:  Medications  diltiazem (CARDIZEM) 1 mg/mL load via infusion 10 mg (has no administration in time range)    And  diltiazem (CARDIZEM) 100 mg in dextrose 5 % 100 mL (1 mg/mL) infusion (has no administration in time range)  heparin bolus via infusion 3,700 Units (has no administration in time range)  heparin ADULT infusion 100 units/mL (25000 units/265m sodium chloride 0.45%) (has no administration in time range)  magnesium sulfate IVPB 2 g 50 mL (has no administration in time range)     ED Discharge Orders    None       Note:  This document was prepared using Dragon voice recognition software and may include unintentional dictation errors.   IDuffy Bruce MD 03/18/19 [phone]

## 2019-03-18 NOTE — Consult Note (Addendum)
Cardiology Consultation Note    Patient ID: Beth Tyler, MRN: [id number], DOB/AGE: [birth year] 74 y.o. Admit date: 03/18/2019   Date of Consult: 03/18/2019 Primary Physician: Einar Pheasant, MD Primary Cardiologist: Dr. Saralyn Pilar  Chief Complaint: rapid heart rate Reason for Consultation: afib with rvr Requesting MD: Dr. Ellender Hose  HPI: Beth Tyler is a 73 y.o. female with history of hypertension, irritable bowel syndrome hyperlipidemia, type 2 diabetes, lymphedema, chronic kidney disease grade 3 who presented to the emergency room with complaints of rapid heart rate and shortness of breath.  Was noted to be in atrial fibrillation rapid ventricular response.  She is on glipizide, Metformin and pioglitazone for her diabetes.  Her A1c was 8.5 earlier this year.  She is on simvastatin with an LDL of 35.  She had an echocardiogram and May 2019 showing normal LV function EF greater than 55% with mild to moderate tricuspid regurgitation.  Patient noted her heart rate was 150 at home and is very irregular.  She was noted to have atrial fibrillation with a ventricular rate of 123.  There were no ischemic changes.  Electrolytes unremarkable with a potassium of 3.9.  Glucose was 233.  Her BUN and creatinine were 17 and 0.82.  BNP was 242.  CBC was normal.  Magnesium is 1.5.  Chest x-ray showed no acute cardiopulmonary disease.  She was given a bolus of metoprolol and Cardizem is being started.  Past Medical History:  Diagnosis Date  . Anemia   . Arthritis   . B12 deficiency   . Carpal tunnel syndrome, bilateral   . Diabetes mellitus ([place]) 02/17/2012  . Diabetes mellitus, type II ([place])   . Dyspnea   . Gastritis   . GERD (gastroesophageal reflux disease)   . Hypercholesterolemia   . Hypertension   . IBS (irritable bowel syndrome)   . Neuropathy    legs  . Pernicious anemia   . Vertigo    no episodes fo 9 or 10 yrs      Surgical History:  Past Surgical History:  Procedure Laterality  Date  . ANTERIOR VITRECTOMY Right 12/01/2017   Procedure: ANTERIOR VITRECTOMY;  Surgeon: Eulogio Bear, MD;  Location: [facility];  Service: Ophthalmology;  Laterality: Right;  . BREAST BIOPSY Right 1980   neg  . BREAST SURGERY  1980   benign cyst removal  . CATARACT EXTRACTION W/PHACO Left 10/13/2017   Procedure: CATARACT EXTRACTION PHACO AND INTRAOCULAR LENS PLACEMENT ([place])  LEFT  DIABETES;  Surgeon: Eulogio Bear, MD;  Location: [facility];  Service: Ophthalmology;  Laterality: Left;  Diabetic - insulin and oral meds  . CATARACT EXTRACTION W/PHACO Right 12/01/2017   Procedure: CATARACT EXTRACTION PHACO AND INTRAOCULAR LENS PLACEMENT ([place])  RIGHT DIABETIC;  Surgeon: Eulogio Bear, MD;  Location: [facility];  Service: Ophthalmology;  Laterality: Right;  Diabetic - insulin and oral  . CHOLECYSTECTOMY  1983     Home Meds: Prior to Admission medications   Medication Sig Start Date End Date Taking? Authorizing Provider  acetaminophen (TYLENOL) 500 MG tablet Take 500 mg by mouth every 6 (six) hours as needed.    [provider]  amLODipine (NORVASC) 10 MG tablet Take 1 tablet by mouth once daily 03/17/19   Einar Pheasant, MD  Blood Glucose Monitoring Suppl ([place]) w/Device KIT  07/05/17   [provider]  carvedilol (COREG) 25 MG tablet TAKE 1 TABLET BY MOUTH TWICE DAILY WITH A MEAL 02/25/19  Einar Pheasant, MD  fluticasone (CUTIVATE) 0.05 % cream USE AS DIRECTED 09/15/18   Einar Pheasant, MD  fluticasone furoate-vilanterol (BREO ELLIPTA) 200-25 MCG/INH AEPB Inhale 1 puff into the lungs daily. 01/01/18   Laverle Hobby, MD  Fluticasone-Salmeterol (ADVAIR DISKUS) 250-50 MCG/DOSE AEPB Inhale 1 puff into the lungs 2 (two) times daily. 10/29/17   Laverle Hobby, MD  hydrochlorothiazide (HYDRODIURIL) 25 MG tablet Take 1 tablet (25 mg total) by mouth daily. 08/04/18   Einar Pheasant, MD  Insulin Glargine (BASAGLAR  KWIKPEN) 100 UNIT/ML SOPN Inject 0.3 mLs (30 Units total) into the skin daily. 06/15/18   Einar Pheasant, MD  insulin lispro ([name]) 100 UNIT/ML injection Inject up to 10 units TID with meals 01/21/19   Einar Pheasant, MD  Insulin Pen Needle 32G X 4 MM MISC Use to inject insulin three times daily 07/20/18   Einar Pheasant, MD  lipase/protease/amylase ([name]) 36000 UNITS CPEP capsule Take by mouth. 2 capsules TID w/ meals and 1 w/ snack 02/08/19 02/08/20  [provider]  lisinopril (ZESTRIL) 20 MG tablet TAKE 1 TABLET BY MOUTH IN THE MORNING AND 2 AT BEDTIME 02/04/19   Einar Pheasant, MD  magnesium oxide (MAG-OX) 400 (241.3 Mg) MG tablet Take 1 tablet by mouth once daily 03/17/19   Einar Pheasant, MD  metFORMIN (GLUCOPHAGE-XR) 500 MG 24 hr tablet Take 2 tablets by mouth twice daily 03/17/19   Einar Pheasant, MD  [facility] LANCETS [address] [place]  07/07/17   [provider]  [facility] VERIO test strip USE 1 STRIP TO CHECK GLUCOSE TWICE DAILY 01/18/19   Einar Pheasant, MD  pantoprazole (PROTONIX) 40 MG tablet TAKE 1 TABLET BY MOUTH TWICE DAILY BEFORE MEAL(S) 09/01/18   Einar Pheasant, MD  pioglitazone (ACTOS) 15 MG tablet Take 1 tablet by mouth once daily 02/23/19   Einar Pheasant, MD  simvastatin (ZOCOR) 10 MG tablet TAKE 1 TABLET BY MOUTH AT BEDTIME 03/17/19   Einar Pheasant, MD  triamcinolone cream (KENALOG) 0.1 % APPLY  CREAM TOPICALLY TWICE DAILY 03/17/19   Einar Pheasant, MD    Inpatient Medications:  . diltiazem  10 mg Intravenous Once   . diltiazem (CARDIZEM) infusion    . heparin 1,100 Units/hr (03/18/19 1210)  . magnesium sulfate bolus IVPB      Allergies:  Allergies  Allergen Reactions  . Hydrocodone-Acetaminophen Anaphylaxis  . Percocet [Oxycodone-Acetaminophen] Anaphylaxis  . Demeclocycline Nausea Only  . Penicillins Nausea Only  . Tetracyclines & Related Nausea Only    Social History   Socioeconomic History  . Marital status: Widowed    Spouse name:  Not on file  . Number of children: 4  . Years of education: Not on file  . Highest education level: Not on file  Occupational History  . Not on file  Tobacco Use  . Smoking status: Never Smoker  . Smokeless tobacco: Never Used  Substance and Sexual Activity  . Alcohol use: No    Alcohol/week: 0.0 standard drinks  . Drug use: No  . Sexual activity: Not Currently  Other Topics Concern  . Not on file  Social History Narrative   Widowed. Has four children. Manager of an apartment building. No tobacco, alcohol or other drug use   Social Determinants of Health   Financial Resource Strain: Low Risk   . Difficulty of Paying Living Expenses: Not hard at all  Food Insecurity: No Food Insecurity  . Worried About Charity fundraiser in the Last Year: Never true  . Ran Out of  Food in the Last Year: Never true  Transportation Needs: No Transportation Needs  . Lack of Transportation (Medical): No  . Lack of Transportation (Non-Medical): No  Physical Activity:   . Days of Exercise per Week: Not on file  . Minutes of Exercise per Session: Not on file  Stress: No Stress Concern Present  . Feeling of Stress : Not at all  Social Connections: Slightly Isolated  . Frequency of Communication with Friends and Family: More than three times a week  . Frequency of Social Gatherings with Friends and Family: More than three times a week  . Attends Religious Services: 1 to 4 times per year  . Active Member of Clubs or Organizations: Yes  . Attends Archivist Meetings: 1 to 4 times per year  . Marital Status: Widowed  Intimate Partner Violence:   . Fear of Current or Ex-Partner: Not on file  . Emotionally Abused: Not on file  . Physically Abused: Not on file  . Sexually Abused: Not on file     Family History  Problem Relation Age of Onset  . Diabetes Father   . Kidney disease Father   . Heart disease Father   . Alcohol abuse Mother   . Dementia Mother   . Diabetes Mother   .  Diabetes Other   . Heart disease Son 36       CABG  . Hemophilia Brother   . Uterine cancer Sister   . Diabetes Sister   . COPD Sister   . Breast cancer Maternal Aunt 51  . Breast cancer Daughter 83  . Stroke Daughter      Review of Systems: A 12-system review of systems was performed and is negative except as noted in the HPI.  Labs: No results for input(s): CKTOTAL, CKMB, TROPONINI in the last 72 hours. Lab Results  Component Value Date   WBC 9.4 03/18/2019   HGB 13.4 03/18/2019   HCT 40.0 03/18/2019   MCV 86.8 03/18/2019   PLT 216 03/18/2019    Recent Labs  Lab 03/18/19 1059  NA 141  K 3.9  CL 104  CO2 25  BUN 17  CREATININE 0.82  CALCIUM 9.2  PROT 7.5  BILITOT 0.8  ALKPHOS 85  ALT 24  AST 21  GLUCOSE 233*   Lab Results  Component Value Date   CHOL 114 03/12/2019   HDL 45.20 03/12/2019   LDLCALC 37 03/12/2019   TRIG 160.0 (H) 03/12/2019   No results found for: DDIMER  Radiology/Studies:  DG Chest Portable 1 View  Result Date: 03/18/2019 CLINICAL DATA:  Palpitations, new onset AFib, SOB. EXAM: PORTABLE CHEST 1 VIEW COMPARISON:  No prior. FINDINGS: Mediastinum and hilar structures normal. Lungs are clear. No pleural effusion or pneumothorax. IMPRESSION: No acute cardiopulmonary disease.  Exam stable from prior exam. Electronically Signed   By: Marcello Moores  Register   On: 03/18/2019 11:17    Wt Readings from Last 3 Encounters:  03/18/19 98 kg  03/15/19 98.4 kg  11/12/18 101.6 kg    EKG: Atrial fibrillation with rapid ventricular response  Physical Exam:  Blood pressure (!) 162/100, pulse (!) 126, temperature 98.5 F (36.9 C), resp. rate 18, height 5' 2"  (1.575 m), weight 98 kg, SpO2 100 %. Body mass index is 39.51 kg/m. General: Well developed, well nourished, in no acute distress. Head: Normocephalic, atraumatic, sclera non-icteric, no xanthomas, nares are without discharge.  Neck: Negative for carotid bruits. JVD not elevated. Lungs: Clear  bilaterally to auscultation  without wheezes, rales, or rhonchi. Breathing is unlabored. Heart: Irregular regular rhythm Abdomen: Soft, non-tender, non-distended with normoactive bowel sounds. No hepatomegaly. No rebound/guarding. No obvious abdominal masses. Msk:  Strength and tone appear normal for age. Extremities: No clubbing or cyanosis. No edema.  Distal pedal pulses are 2+ and equal bilaterally. Neuro: Alert and oriented X 3. No facial asymmetry. No focal deficit. Moves all extremities spontaneously. Psych:  Responds to questions appropriately with a normal affect.     Assessment and Plan  72 year old female with irritable bowel syndrome, pulmonary hypertension diabetes hyperlipidemia chronic kidney disease presented to the ER with complaints of rapid heart rate.  She was noted to be in A. fib with RVR.  She has not been noted to be in this before.  She had a stress echo done in May 2019 showing preserved LV function with no ischemia no arrhythmia.  She recently was started on [name] for her IBS and has lost about 35 pounds.  She had no significant electrolyte abnormality except for a low magnesium.  She has received magnesium supplementation IV in the ER.  Atrial fibrillation-currently with rapid ventricular response.  Will start Cardizem IV after a bolus and see if this will control her rate and/or rhythm.  Will need to carefully determine whether or not to start her on long-term anticoagulation given her IBS however unless she has significant bleeding, would recommend Eliquis.  Her CHA2DS2-VASc score is 1 for age greater than 25, 2 for female gender, 3 for hypertension, for for diabetes and she will turn 11 in June of next year.  This gives her a total of 4 giving her a 4.8% per year stroke risk.Marland Kitchen Her HasBled score is 2 representing a 4.1 % risk for major bleeding. Will continue with heparin for now and follow for bleeding. Will continue with iv cardizem for now and convert to po if rate is  controlled. Echo will be done when rate is in better control to further evaluate her lv and atrial sizes.   HTN-continue with , carvedilol at 25 mg twice daily, hydrochlorothiazide 25 mg daily, lisinopril 20 mg daily. WIll hold amlodipine while on iv cardizem and follow her pressure.   Hyperlipidemia-continue with his simvastatin at 10 mg daily.  LDL goal of less than 100 preferably near 70 given her diabetes.  Beth Booty MD 03/18/2019, 12:21 PM Pager: [phone]

## 2019-03-18 NOTE — Progress Notes (Addendum)
ANTICOAGULATION CONSULT NOTE - Initial Consult  Pharmacy Consult for Heparin Drip Indication: atrial fibrillation  Allergies  Allergen Reactions  . Hydrocodone-Acetaminophen Anaphylaxis  . Percocet [Oxycodone-Acetaminophen] Anaphylaxis  . Demeclocycline Nausea Only  . Penicillins Nausea Only  . Tetracyclines & Related Nausea Only    Patient Measurements: Height: 5\' 2"  (157.5 cm) Weight: 216 lb (98 kg) IBW/kg (Calculated) : 50.1 Heparin Dosing Weight: 73.2 kg  Vital Signs: Temp: 98.5 F (36.9 C) (12/10 1034) Temp Source: Oral (12/10 1031) BP: 162/100 (12/10 1031) Pulse Rate: 126 (12/10 1031)  Labs: Recent Labs    03/18/19 1059  HGB 13.4  HCT 40.0  PLT 216    Estimated Creatinine Clearance: 57.4 mL/min (by C-G formula based on SCr of 0.94 mg/dL).   Medical History: Past Medical History:  Diagnosis Date  . Anemia   . Arthritis   . B12 deficiency   . Carpal tunnel syndrome, bilateral   . Diabetes mellitus ([place]) 02/17/2012  . Diabetes mellitus, type II ([place])   . Dyspnea   . Gastritis   . GERD (gastroesophageal reflux disease)   . Hypercholesterolemia   . Hypertension   . IBS (irritable bowel syndrome)   . Neuropathy    legs  . Pernicious anemia   . Vertigo    no episodes fo 9 or 10 yrs    Medications:  Scheduled:  . diltiazem  10 mg Intravenous Once  . heparin  3,700 Units Intravenous Once   Infusions:  . diltiazem (CARDIZEM) infusion    . heparin      Assessment: 73 yo F to start Heparin drip for Afib No anticoagulants PTA noted per Med Rec Hgb 13.4  Plt 216  INR 1.0  APTT 31  Goal of Therapy:  Heparin level 0.3-0.7 units/ml Monitor platelets by anticoagulation protocol: Yes   Plan:  Give 3700 units bolus x 1 Start heparin infusion at 1100 units/hr Check anti-Xa level in 8 hours and daily while on heparin Continue to monitor H&H and platelets  Yomayra Tate A 03/18/2019,11:19 AM

## 2019-03-18 NOTE — ED Triage Notes (Signed)
Pt c/o feeling like her heart is racing when she sat up on the side of the bed this morning. Denies pain or SOB. Denies hx of a-fib, pt is in NAD on arrival

## 2019-03-19 ENCOUNTER — Inpatient Hospital Stay
Admit: 2019-03-19 | Discharge: 2019-03-19 | Disposition: A | Discharge status: Home / Self Care | Payer: Medicare HMO | Attending: Cardiology | Admitting: Cardiology

## 2019-03-19 LAB — BASIC METABOLIC PANEL
Anion gap: 13 (ref 5–15)
BUN: 21 mg/dL (ref 8–23)
CO2: 23 mmol/L (ref 22–32)
Calcium: 9.1 mg/dL (ref 8.9–10.3)
Chloride: 105 mmol/L (ref 98–111)
Creatinine, Ser: 1.05 mg/dL — ABNORMAL HIGH (ref 0.44–1.00)
GFR calc Af Amer: >60 mL/min (ref >60)
GFR calc non Af Amer: 52 mL/min — ABNORMAL LOW (ref >60)
Glucose, Bld: 175 mg/dL — ABNORMAL HIGH (ref 70–99)
Potassium: 3.7 mmol/L (ref 3.5–5.1)
Sodium: 141 mmol/L (ref 135–145)

## 2019-03-19 LAB — GLUCOSE, CAPILLARY
Glucose-Capillary: 173 mg/dL — ABNORMAL HIGH (ref 70–99)
Glucose-Capillary: 207 mg/dL — ABNORMAL HIGH (ref 70–99)
Glucose-Capillary: 204 mg/dL — ABNORMAL HIGH (ref 70–99)
Glucose-Capillary: 306 mg/dL — ABNORMAL HIGH (ref 70–99)

## 2019-03-19 LAB — CBC
HCT: 39.6 % (ref 36.0–46.0)
Hemoglobin: 12.9 g/dL (ref 12.0–15.0)
MCH: 28.7 pg (ref 26.0–34.0)
MCHC: 32.6 g/dL (ref 30.0–36.0)
MCV: 88.2 fL (ref 80.0–100.0)
Platelets: 224 10*3/uL (ref 150–400)
RBC: 4.49 MIL/uL (ref 3.87–5.11)
RDW: 13.2 % (ref 11.5–15.5)
WBC: 11.6 10*3/uL — ABNORMAL HIGH (ref 4.0–10.5)
nRBC: 0 % (ref 0.0–0.2)

## 2019-03-19 LAB — HEMOGLOBIN A1C
Hgb A1c MFr Bld: 8.3 % — ABNORMAL HIGH (ref 4.8–5.6)
Mean Plasma Glucose: 191.51 mg/dL

## 2019-03-19 LAB — ECHOCARDIOGRAM COMPLETE
Height: 62 in
Weight: 3456 oz

## 2019-03-19 LAB — SARS CORONAVIRUS 2 (TAT 6-24 HRS): SARS Coronavirus 2: POSITIVE — AB

## 2019-03-19 LAB — TSH: TSH: 2.077 u[IU]/mL (ref 0.350–4.500)

## 2019-03-19 LAB — FIBRIN DERIVATIVES D-DIMER (ARMC ONLY): Fibrin derivatives D-dimer (ARMC): 745.47 ng/mL (FEU) — ABNORMAL HIGH (ref 0.00–499.00)

## 2019-03-19 LAB — FERRITIN: Ferritin: 166 ng/mL (ref 11–307)

## 2019-03-19 LAB — HEPARIN LEVEL (UNFRACTIONATED): Heparin Unfractionated: 0.55 IU/mL (ref 0.30–0.70)

## 2019-03-19 LAB — MAGNESIUM: Magnesium: 1.9 mg/dL (ref 1.7–2.4)

## 2019-03-19 LAB — SEDIMENTATION RATE: Sed Rate: 37 mm/hr — ABNORMAL HIGH (ref 0–30)

## 2019-03-19 MED ORDER — APIXABAN 5 MG PO TABS
5.0000 mg | ORAL_TABLET | Freq: Two times a day (BID) | ORAL | Status: DC
  Administered 2019-03-19 – 2019-03-22 (×7): 5 mg via ORAL
  Filled 2019-03-19 (×9): qty 1

## 2019-03-19 MED ORDER — DILTIAZEM HCL ER COATED BEADS 120 MG PO CP24
120.0000 mg | ORAL_CAPSULE | Freq: Every day | ORAL | Status: DC
  Administered 2019-03-19 – 2019-03-21 (×3): 120 mg via ORAL
  Filled 2019-03-19 (×4): qty 1

## 2019-03-19 MED ORDER — INSULIN ASPART 100 UNIT/ML ~~LOC~~ SOLN
0.0000 [IU] | Freq: Three times a day (TID) | SUBCUTANEOUS | Status: DC
  Administered 2019-03-19 – 2019-03-20 (×2): 7 [IU] via SUBCUTANEOUS
  Administered 2019-03-20: 12:00:00 15 [IU] via SUBCUTANEOUS
  Administered 2019-03-20: 09:00:00 7 [IU] via SUBCUTANEOUS
  Administered 2019-03-21: 10:00:00 3 [IU] via SUBCUTANEOUS
  Administered 2019-03-21: 7 [IU] via SUBCUTANEOUS
  Administered 2019-03-21: 16:00:00 15 [IU] via SUBCUTANEOUS
  Administered 2019-03-22: 4 [IU] via SUBCUTANEOUS
  Administered 2019-03-22: 20 [IU] via SUBCUTANEOUS
  Filled 2019-03-19 (×9): qty 1

## 2019-03-19 MED ORDER — DEXAMETHASONE 4 MG PO TABS
4.0000 mg | ORAL_TABLET | Freq: Two times a day (BID) | ORAL | Status: DC
  Administered 2019-03-19 – 2019-03-22 (×7): 4 mg via ORAL
  Filled 2019-03-19 (×9): qty 1

## 2019-03-19 MED ORDER — SODIUM CHLORIDE 0.9 % IV SOLN
100.0000 mg | Freq: Every day | INTRAVENOUS | Status: DC
  Administered 2019-03-20 – 2019-03-22 (×3): 100 mg via INTRAVENOUS
  Filled 2019-03-19: qty 20
  Filled 2019-03-19: qty 100
  Filled 2019-03-19: qty 20
  Filled 2019-03-19: qty 100

## 2019-03-19 MED ORDER — INSULIN ASPART 100 UNIT/ML ~~LOC~~ SOLN
0.0000 [IU] | Freq: Every day | SUBCUTANEOUS | Status: DC
  Administered 2019-03-20: 2 [IU] via SUBCUTANEOUS
  Administered 2019-03-21: 4 [IU] via SUBCUTANEOUS
  Filled 2019-03-19 (×4): qty 1

## 2019-03-19 MED ORDER — SODIUM CHLORIDE 0.9 % IV SOLN
200.0000 mg | Freq: Once | INTRAVENOUS | Status: AC
  Administered 2019-03-19: 200 mg via INTRAVENOUS
  Filled 2019-03-19: qty 200

## 2019-03-19 NOTE — ED Notes (Signed)
Pt given meal tray. Pt states she is waiting for her creon medication before she eats.  This RN contact pharmacy x2 for medication. Waiting on medication to be sent.

## 2019-03-19 NOTE — Progress Notes (Signed)
PROGRESS NOTE    Beth Tyler  ZOX:[id number] DOB: Nov 21, 1945 DOA: 03/18/2019 PCP: Einar Pheasant, MD   Brief Narrative:  73 year old female with CKD 3, pancreatic insufficiency, chronic lymphedema, stasis dermatitis, hypertension, obesity came to the hospital with chief complaint of palpitations, found to be in A. fib with RVR with rates into the 120s-140s.  Cardiology was consulted.  She was placed on diltiazem drip. Also found to have positive COVID-19.  Elevated inflammatory markers today. Started her on Decadron and remdesivir.  Subjective: Was feeling better when seen this morning.  She was surprised about her Covid testing.  Per patient she does not go out at all.  No sick contacts.  She asked her family to be tested now.  Denies any shortness of breath.  Assessment & Plan:   Principal Problem:   New onset atrial fibrillation (HCC) Active Problems:   Hypertension   Hyperlipidemia   Chronic hyperkalemia   BMI 40.0-44.9, adult (HCC)   Lymphedema   CKD (chronic kidney disease), stage III   Controlled type 2 diabetes mellitus with hyperglycemia (HCC)   Chronic stasis dermatitis   Diabetes-pancreatic exocrine dysfunction syndrome (HCC)   ILD (interstitial lung disease) ([place])  New onset atrial fibrillation with RVR .  Patient was converted to sinus rhythm with Cardizem infusion.  Mali vas score of 3 which will increase to 4 once she turns 74. Cardiology is following-appreciate their recommendations Her Cardizem infusion is converted to p.o. Cardizem 120 mg daily. She was also started on Eliquis. Echo was within normal limit. She will follow-up with cardiology as an outpatient-As they signed off now.  Hypertension -Currently controlled. Continuing home dose of carvedilol and lisinopril. Holding HCTZ and Norvasc as patient is being started on diltiazem. HCTZ can be restarted if needed.  Diabetic nephropathy.  Creatinine around 1. -Continue to monitor.  Type 2  diabetes.  Uncontrolled with A1c of 8.3. Currently being controlled with Lantus and SSI. -SSI to resistant scale as patient will get some steroid.  Pancreatic insufficiency/diabetes pancreatic exocrine dysfunction syndrome -Therapeutic substitution for preadmission [name]  Interstitial lung disease -Therapeutic substitution of Dulera for Advair and continue Breo Ellipta -Not chronically hypoxemic and currently stable on room air  Chronic lymphedema and stasis dermatitis -Lower extremity stable and at baseline per patient   HLD -Continue preadmission Zocor.  Objective: Vitals:   03/19/19 1000 03/19/19 1030 03/19/19 1200 03/19/19 1230  BP: (!) 162/69 (!) 160/74 127/62 (!) 130/59  Pulse: 78 84 72 73  Resp: 14 17 17  (!) 24  Temp:      TempSrc:      SpO2: 94% 91% 94% 93%  Weight:      Height:        Intake/Output Summary (Last 24 hours) at 03/19/2019 1444 Last data filed at 03/19/2019 1322 Gross per 24 hour  Intake 215.95 ml  Output --  Net 215.95 ml   Filed Weights   03/18/19 1032  Weight: 98 kg    Examination:  General exam: Appears calm and comfortable  Respiratory system: Clear to auscultation. Respiratory effort normal. Cardiovascular system: S1 & S2 heard, RRR. No JVD, murmurs, rubs, gallops or clicks. 1+ pedal edema. Gastrointestinal system: Abdomen is nondistended, soft and nontender. No organomegaly or masses felt. Normal bowel sounds heard. Central nervous system: Alert and oriented. No focal neurological deficits. Extremities: Symmetric 5 x 5 power. Skin: Signs of bilateral lower extremity stasis dermatitis. Psychiatry: Judgement and insight appear normal. Mood & affect appropriate.   DVT prophylaxis: Eliquis  Code Status: Full Family Communication: Updated the patient.  No family at bedside Disposition Plan: Pending completion of remdesivir.  Consultants:   Cardiology  Procedures:  Antimicrobials:   Data Reviewed: I have personally reviewed  following labs and imaging studies  CBC: Recent Labs  Lab 03/18/19 1059 03/19/19 0527  WBC 9.4 11.6*  NEUTROABS 5.9  --   HGB 13.4 12.9  HCT 40.0 39.6  MCV 86.8 88.2  PLT 216 224   Basic Metabolic Panel: Recent Labs  Lab 03/18/19 1059 03/19/19 0527 03/19/19 0824  NA 141 141  --   K 3.9 3.7  --   CL 104 105  --   CO2 25 23  --   GLUCOSE 233* 175*  --   BUN 17 21  --   CREATININE 0.82 1.05*  --   CALCIUM 9.2 9.1  --   MG 1.5*  --  1.9   GFR: Estimated Creatinine Clearance: 51.4 mL/min (A) (by C-G formula based on SCr of 1.05 mg/dL (H)). Liver Function Tests: Recent Labs  Lab 03/18/19 1059  AST 21  ALT 24  ALKPHOS 85  BILITOT 0.8  PROT 7.5  ALBUMIN 4.2   Recent Labs  Lab 03/18/19 1059  LIPASE 20   No results for input(s): AMMONIA in the last 168 hours. Coagulation Profile: Recent Labs  Lab 03/18/19 1053  INR 1.0   Cardiac Enzymes: No results for input(s): CKTOTAL, CKMB, CKMBINDEX, TROPONINI in the last 168 hours. BNP (last 3 results) No results for input(s): PROBNP in the last 8760 hours. HbA1C: Recent Labs    03/19/19 0527  HGBA1C 8.3*   CBG: Recent Labs  Lab 03/18/19 1817 03/18/19 2120 03/19/19 0810 03/19/19 1216  GLUCAP 186* 172* 173* 207*   Lipid Profile: No results for input(s): CHOL, HDL, LDLCALC, TRIG, CHOLHDL, LDLDIRECT in the last 72 hours. Thyroid Function Tests: Recent Labs    03/19/19 0527  TSH 2.077   Anemia Panel: Recent Labs    03/19/19 0527  FERRITIN 166   Sepsis Labs: No results for input(s): PROCALCITON, LATICACIDVEN in the last 168 hours.  Recent Results (from the past 240 hour(s))  SARS CORONAVIRUS 2 (TAT 6-24 HRS) Nasopharyngeal Nasopharyngeal Swab     Status: Abnormal   Collection Time: 03/18/19  1:21 PM   Specimen: Nasopharyngeal Swab  Result Value Ref Range Status   SARS Coronavirus 2 POSITIVE (A) NEGATIVE Final    Comment: RESULT CALLED TO, READ BACK BY AND VERIFIED WITH: R DAVID,RN 0108 03/19/2019  D BRADLEY (NOTE) SARS-CoV-2 target nucleic acids are DETECTED. The SARS-CoV-2 RNA is generally detectable in upper and lower respiratory specimens during the acute phase of infection. Positive results are indicative of the presence of SARS-CoV-2 RNA. Clinical correlation with patient history and other diagnostic information is  necessary to determine patient infection status. Positive results do not rule out bacterial infection or co-infection with other viruses.  The expected result is Negative. Fact Sheet for Patients: HairSlick.no Fact Sheet for Healthcare Providers: quierodirigir.com This test is not yet approved or cleared by the [place] FDA and  has been authorized for detection and/or diagnosis of SARS-CoV-2 by FDA under an Emergency Use Authorization (EUA). This EUA will remain  in effect (meaning this test can be used) for th e duration of the COVID-19 declaration under Section 564(b)(1) of the Act, 21 U.S.C. section 360bbb-3(b)(1), unless the authorization is terminated or revoked sooner. Performed at [facility], [address], [place], [place] [address]  Radiology Studies: DG Chest Portable 1 View  Result Date: 03/18/2019 CLINICAL DATA:  Palpitations, new onset AFib, SOB. EXAM: PORTABLE CHEST 1 VIEW COMPARISON:  No prior. FINDINGS: Mediastinum and hilar structures normal. Lungs are clear. No pleural effusion or pneumothorax. IMPRESSION: No acute cardiopulmonary disease.  Exam stable from prior exam. Electronically Signed   By: Maisie Fus  Register   On: 03/18/2019 11:17   ECHOCARDIOGRAM COMPLETE  Result Date: 03/19/2019   ECHOCARDIOGRAM REPORT   Patient Name:   Beth Tyler [facility] Date of Exam: 03/19/2019 Medical Rec #:  [id number]        Height:       62.0 in Accession #:    [id number]       Weight:       216.0 lb Date of Birth:  1945/07/12        BSA:          1.98 m Patient Age:    73 years         BP:            161/46 mmHg Patient Gender: F                HR:           79 bpm. Exam Location:  [facility] Procedure: 2D Echo, Color Doppler and Cardiac Doppler Indications:     I48.91 Atrial fibrillation  History:         Patient has no prior history of Echocardiogram examinations.                  Risk Factors:Hypertension, Diabetes and HCL.  Sonographer:     Humphrey Rolls RDCS (AE) Referring Phys:  [id number] Dalia Heading Diagnosing Phys: Harold Hedge MD  Sonographer Comments: Suboptimal subcostal window. IMPRESSIONS  1. Left ventricular ejection fraction, by visual estimation, is 65 to 70%. The left ventricle has normal function. Left ventricular septal wall thickness was mildly increased. Mildly increased left ventricular posterior wall thickness. There is mildly increased left ventricular hypertrophy.  2. Global right ventricle has normal systolic function.The right ventricular size is normal. No increase in right ventricular wall thickness.  3. Left atrial size was normal.  4. Right atrial size was normal.  5. The mitral valve is grossly normal. Trivial mitral valve regurgitation.  6. The tricuspid valve is grossly normal. Tricuspid valve regurgitation is trivial.  7. The aortic valve is grossly normal. Aortic valve regurgitation is trivial.  8. The pulmonic valve was not well visualized. Pulmonic valve regurgitation is trivial.  9. The atrial septum is grossly normal. FINDINGS  Left Ventricle: Left ventricular ejection fraction, by visual estimation, is 65 to 70%. The left ventricle has normal function. The left ventricle is not well visualized. Mildly increased left ventricular posterior wall thickness. There is mildly increased left ventricular hypertrophy. Right Ventricle: The right ventricular size is normal. No increase in right ventricular wall thickness. Global RV systolic function is has normal systolic function. Left Atrium: Left atrial size was normal in size. Right Atrium: Right atrial size was normal in size  Pericardium: There is no evidence of pericardial effusion. Mitral Valve: The mitral valve is grossly normal. Trivial mitral valve regurgitation. MV peak gradient, 5.0 mmHg. Tricuspid Valve: The tricuspid valve is grossly normal. Tricuspid valve regurgitation is trivial. Aortic Valve: The aortic valve is grossly normal. Aortic valve regurgitation is trivial. Aortic valve mean gradient measures 5.0 mmHg. Aortic valve peak gradient measures 12.7 mmHg. Aortic valve area, by VTI measures 1.32 cm.  Pulmonic Valve: The pulmonic valve was not well visualized. Pulmonic valve regurgitation is trivial. Pulmonic regurgitation is trivial. Aorta: The aortic root is normal in size and structure. IAS/Shunts: The atrial septum is grossly normal.  LEFT VENTRICLE PLAX 2D LVIDd:         3.21 cm       Diastology LVIDs:         2.40 cm       LV e' lateral:   6.96 cm/s LV PW:         0.90 cm       LV E/e' lateral: 9.6 LV IVS:        0.90 cm       LV e' medial:    6.09 cm/s LVOT diam:     1.70 cm       LV E/e' medial:  10.9 LV SV:         21 ml LV SV Index:   9.95 LVOT Area:     2.27 cm  LV Volumes (MOD) LV area d, A2C:    24.40 cm LV area d, A4C:    23.60 cm LV area s, A2C:    15.10 cm LV area s, A4C:    16.90 cm LV major d, A2C:   7.50 cm LV major d, A4C:   6.60 cm LV major s, A2C:   6.60 cm LV major s, A4C:   6.76 cm LV vol d, MOD A2C: 65.6 ml LV vol d, MOD A4C: 69.4 ml LV vol s, MOD A2C: 29.1 ml LV vol s, MOD A4C: 35.1 ml LV SV MOD A2C:     36.5 ml LV SV MOD A4C:     69.4 ml LV SV MOD BP:      39.7 ml LEFT ATRIUM             Index LA diam:        2.60 cm 1.32 cm/m LA Vol (A2C):   32.5 ml 16.45 ml/m LA Vol (A4C):   25.2 ml 12.76 ml/m LA Biplane Vol: 29.0 ml 14.68 ml/m  AORTIC VALVE                    PULMONIC VALVE AV Area (Vmax):    1.24 cm     PV Vmax:       1.10 m/s AV Area (Vmean):   1.37 cm     PV Vmean:      72.800 cm/s AV Area (VTI):     1.32 cm     PV VTI:        0.175 m AV Vmax:           178.00 cm/s  PV Peak grad:   4.8 mmHg AV Vmean:          109.000 cm/s PV Mean grad:  2.0 mmHg AV VTI:            0.324 m AV Peak Grad:      12.7 mmHg AV Mean Grad:      5.0 mmHg LVOT Vmax:         97.00 cm/s LVOT Vmean:        66.000 cm/s LVOT VTI:          0.189 m LVOT/AV VTI ratio: 0.58  AORTA Ao Root diam: 2.90 cm MITRAL VALVE MV Area (PHT): 3.65 cm             SHUNTS MV Peak grad:  5.0 mmHg  Systemic VTI:  0.19 m MV Mean grad:  3.0 mmHg             Systemic Diam: 1.70 cm MV Vmax:       1.12 m/s MV Vmean:      76.8 cm/s MV VTI:        0.26 m MV PHT:        60.32 msec MV Decel Time: 208 msec MV E velocity: 66.50 cm/s 103 cm/s MV A velocity: 89.20 cm/s 70.3 cm/s MV E/A ratio:  0.75       1.5  Harold HedgeKenneth Fath MD Electronically signed by Harold HedgeKenneth Fath MD Signature Date/Time: 03/19/2019/11:04:07 AM    Final     Scheduled Meds: . apixaban  5 mg Oral BID  . carvedilol  12.5 mg Oral BID WC  . dexamethasone  4 mg Oral Q12H  . diltiazem  120 mg Oral Daily  . fluticasone furoate-vilanterol  1 puff Inhalation Daily  . insulin aspart  0-15 Units Subcutaneous TID WC  . insulin aspart  0-5 Units Subcutaneous QHS  . insulin glargine  30 Units Subcutaneous Daily  . lipase/protease/amylase  36,000 Units Oral TID WC  . lisinopril  20 mg Oral Daily  . magnesium oxide  400 mg Oral Daily  . mometasone-formoterol  2 puff Inhalation BID  . pioglitazone  15 mg Oral Daily  . simvastatin  10 mg Oral q1800  . sodium chloride flush  3 mL Intravenous Q12H   Continuous Infusions: . sodium chloride    . remdesivir 200 mg in sodium chloride 0.9% 250 mL IVPB     Followed by  . [START ON 03/20/2019] remdesivir 100 mg in NS 100 mL       LOS: 1 day   Time spent: 45 minutes.  I personally reviewed her chart.  Arnetha CourserSumayya Chiquita Heckert, MD [facility] Pager [phone]  If 7PM-7AM, please contact night-coverage [email or web address] Password [email or web address] 03/19/2019, 2:44 PM   This record has been created using Dragon voice recognition software. Errors  have been sought and corrected,but may not always be located. Such creation errors do not reflect on the standard of care.

## 2019-03-19 NOTE — ED Notes (Signed)
Spoke to Advertising account executive. Assigned floor RN given this RN phone number to call back.

## 2019-03-19 NOTE — ED Notes (Signed)
Report received from Rachel, RN.

## 2019-03-19 NOTE — Progress Notes (Signed)
Patient Name: Beth Tyler Date of Encounter: 03/19/2019  Hospital Problem List     Principal Problem:   New onset atrial fibrillation [facility]) Active Problems:   Hypertension   Hyperlipidemia   Chronic hyperkalemia   BMI 40.0-44.9, adult (HCC)   Lymphedema   CKD (chronic kidney disease), stage III   Controlled type 2 diabetes mellitus with hyperglycemia (HCC)   Chronic stasis dermatitis   Diabetes-pancreatic exocrine dysfunction syndrome (HCC)   ILD (interstitial lung disease) [facility])    Patient Profile     73 year old female with history of interstitial lung disease, hypertension hyperlipidemia, diabetes who was admitted with new onset atrial fibrillation.  She also was diagnosed with Covid 19.  She was placed on IV heparin and IV Cardizem drip.  She has converted to sinus rhythm.  Her weight is well controlled.  Subjective   Feels a lot better.  Denies chest pain or shortness of breath.  Inpatient Medications    . apixaban  5 mg Oral BID  . carvedilol  12.5 mg Oral BID WC  . diltiazem  120 mg Oral Daily  . fluticasone furoate-vilanterol  1 puff Inhalation Daily  . insulin aspart  0-15 Units Subcutaneous TID WC  . insulin aspart  0-5 Units Subcutaneous QHS  . insulin glargine  30 Units Subcutaneous Daily  . lipase/protease/amylase  36,000 Units Oral TID WC  . lisinopril  20 mg Oral Daily  . magnesium oxide  400 mg Oral Daily  . mometasone-formoterol  2 puff Inhalation BID  . pioglitazone  15 mg Oral Daily  . simvastatin  10 mg Oral q1800  . sodium chloride flush  3 mL Intravenous Q12H    Vital Signs    Vitals:   03/19/19 1000 03/19/19 1030 03/19/19 1200 03/19/19 1230  BP: (!) 162/69 (!) 160/74 127/62 (!) 130/59  Pulse: 78 84 72 73  Resp: 14 17 17  (!) 24  Temp:      TempSrc:      SpO2: 94% 91% 94% 93%  Weight:      Height:        Intake/Output Summary (Last 24 hours) at 03/19/2019 1303 Last data filed at 03/18/2019 2140 Gross per 24 hour  Intake  115.95 ml  Output --  Net 115.95 ml   Filed Weights   03/18/19 1032  Weight: 98 kg    Physical Exam    GEN: Well nourished, well developed, in no acute distress.  HEENT: normal.  Neck: Supple, no JVD, carotid bruits, or masses. Cardiac: RRR, no murmurs, rubs, or gallops. No clubbing, cyanosis, edema.  Radials/DP/PT 2+ and equal bilaterally.  Respiratory:  Respirations regular and unlabored, clear to auscultation bilaterally. GI: Soft, nontender, nondistended, BS + x 4. MS: no deformity or atrophy. Skin: warm and dry, no rash. Neuro:  Strength and sensation are intact. Psych: Normal affect.  Labs    CBC Recent Labs    03/18/19 1059 03/19/19 0527  WBC 9.4 11.6*  NEUTROABS 5.9  --   HGB 13.4 12.9  HCT 40.0 39.6  MCV 86.8 88.2  PLT 216 782   Basic Metabolic Panel Recent Labs    03/18/19 1059 03/19/19 0527 03/19/19 0824  NA 141 141  --   K 3.9 3.7  --   CL 104 105  --   CO2 25 23  --   GLUCOSE 233* 175*  --   BUN 17 21  --   CREATININE 0.82 1.05*  --   CALCIUM 9.2 9.1  --  MG 1.5*  --  1.9   Liver Function Tests Recent Labs    03/18/19 1059  AST 21  ALT 24  ALKPHOS 85  BILITOT 0.8  PROT 7.5  ALBUMIN 4.2   Recent Labs    03/18/19 1059  LIPASE 20   Cardiac Enzymes No results for input(s): CKTOTAL, CKMB, CKMBINDEX, TROPONINI in the last 72 hours. BNP Recent Labs    03/18/19 1059  BNP 242.0*   D-Dimer No results for input(s): DDIMER in the last 72 hours. Hemoglobin A1C Recent Labs    03/19/19 0527  HGBA1C 8.3*   Fasting Lipid Panel No results for input(s): CHOL, HDL, LDLCALC, TRIG, CHOLHDL, LDLDIRECT in the last 72 hours. Thyroid Function Tests Recent Labs    03/19/19 0527  TSH 2.077    Telemetry    Normal sinus rhythm  ECG    Atrial fibrillation rapid ventricular response  Radiology    DG Chest Portable 1 View  Result Date: 03/18/2019 CLINICAL DATA:  Palpitations, new onset AFib, SOB. EXAM: PORTABLE CHEST 1 VIEW  COMPARISON:  No prior. FINDINGS: Mediastinum and hilar structures normal. Lungs are clear. No pleural effusion or pneumothorax. IMPRESSION: No acute cardiopulmonary disease.  Exam stable from prior exam. Electronically Signed   By: Maisie Fus  Register   On: 03/18/2019 11:17   ECHOCARDIOGRAM COMPLETE  Result Date: 03/19/2019   ECHOCARDIOGRAM REPORT   Patient Name:   Beth Tyler [facility] Date of Exam: 03/19/2019 Medical Rec #:  [id number]        Height:       62.0 in Accession #:    [id number]       Weight:       216.0 lb Date of Birth:  [birth year]        BSA:          1.98 m Patient Age:    73 years         BP:           161/46 mmHg Patient Gender: F                HR:           79 bpm. Exam Location:  [facility] Procedure: 2D Echo, Color Doppler and Cardiac Doppler Indications:     I48.91 Atrial fibrillation  History:         Patient has no prior history of Echocardiogram examinations.                  Risk Factors:Hypertension, Diabetes and HCL.  Sonographer:     Humphrey Rolls RDCS (AE) Referring Phys:  [id number] Dalia Heading Diagnosing Phys: Harold Hedge MD  Sonographer Comments: Suboptimal subcostal window. IMPRESSIONS  1. Left ventricular ejection fraction, by visual estimation, is 65 to 70%. The left ventricle has normal function. Left ventricular septal wall thickness was mildly increased. Mildly increased left ventricular posterior wall thickness. There is mildly increased left ventricular hypertrophy.  2. Global right ventricle has normal systolic function.The right ventricular size is normal. No increase in right ventricular wall thickness.  3. Left atrial size was normal.  4. Right atrial size was normal.  5. The mitral valve is grossly normal. Trivial mitral valve regurgitation.  6. The tricuspid valve is grossly normal. Tricuspid valve regurgitation is trivial.  7. The aortic valve is grossly normal. Aortic valve regurgitation is trivial.  8. The pulmonic valve was not well visualized. Pulmonic valve regurgitation is  trivial.  9. The atrial septum is grossly normal.  FINDINGS  Left Ventricle: Left ventricular ejection fraction, by visual estimation, is 65 to 70%. The left ventricle has normal function. The left ventricle is not well visualized. Mildly increased left ventricular posterior wall thickness. There is mildly increased left ventricular hypertrophy. Right Ventricle: The right ventricular size is normal. No increase in right ventricular wall thickness. Global RV systolic function is has normal systolic function. Left Atrium: Left atrial size was normal in size. Right Atrium: Right atrial size was normal in size Pericardium: There is no evidence of pericardial effusion. Mitral Valve: The mitral valve is grossly normal. Trivial mitral valve regurgitation. MV peak gradient, 5.0 mmHg. Tricuspid Valve: The tricuspid valve is grossly normal. Tricuspid valve regurgitation is trivial. Aortic Valve: The aortic valve is grossly normal. Aortic valve regurgitation is trivial. Aortic valve mean gradient measures 5.0 mmHg. Aortic valve peak gradient measures 12.7 mmHg. Aortic valve area, by VTI measures 1.32 cm. Pulmonic Valve: The pulmonic valve was not well visualized. Pulmonic valve regurgitation is trivial. Pulmonic regurgitation is trivial. Aorta: The aortic root is normal in size and structure. IAS/Shunts: The atrial septum is grossly normal.  LEFT VENTRICLE PLAX 2D LVIDd:         3.21 cm       Diastology LVIDs:         2.40 cm       LV e' lateral:   6.96 cm/s LV PW:         0.90 cm       LV E/e' lateral: 9.6 LV IVS:        0.90 cm       LV e' medial:    6.09 cm/s LVOT diam:     1.70 cm       LV E/e' medial:  10.9 LV SV:         21 ml LV SV Index:   9.95 LVOT Area:     2.27 cm  LV Volumes (MOD) LV area d, A2C:    24.40 cm LV area d, A4C:    23.60 cm LV area s, A2C:    15.10 cm LV area s, A4C:    16.90 cm LV major d, A2C:   7.50 cm LV major d, A4C:   6.60 cm LV major s, A2C:   6.60 cm LV major s, A4C:   6.76 cm LV vol d, MOD  A2C: 65.6 ml LV vol d, MOD A4C: 69.4 ml LV vol s, MOD A2C: 29.1 ml LV vol s, MOD A4C: 35.1 ml LV SV MOD A2C:     36.5 ml LV SV MOD A4C:     69.4 ml LV SV MOD BP:      39.7 ml LEFT ATRIUM             Index LA diam:        2.60 cm 1.32 cm/m LA Vol (A2C):   32.5 ml 16.45 ml/m LA Vol (A4C):   25.2 ml 12.76 ml/m LA Biplane Vol: 29.0 ml 14.68 ml/m  AORTIC VALVE                    PULMONIC VALVE AV Area (Vmax):    1.24 cm     PV Vmax:       1.10 m/s AV Area (Vmean):   1.37 cm     PV Vmean:      72.800 cm/s AV Area (VTI):     1.32 cm     PV VTI:  0.175 m AV Vmax:           178.00 cm/s  PV Peak grad:  4.8 mmHg AV Vmean:          109.000 cm/s PV Mean grad:  2.0 mmHg AV VTI:            0.324 m AV Peak Grad:      12.7 mmHg AV Mean Grad:      5.0 mmHg LVOT Vmax:         97.00 cm/s LVOT Vmean:        66.000 cm/s LVOT VTI:          0.189 m LVOT/AV VTI ratio: 0.58  AORTA Ao Root diam: 2.90 cm MITRAL VALVE MV Area (PHT): 3.65 cm             SHUNTS MV Peak grad:  5.0 mmHg             Systemic VTI:  0.19 m MV Mean grad:  3.0 mmHg             Systemic Diam: 1.70 cm MV Vmax:       1.12 m/s MV Vmean:      76.8 cm/s MV VTI:        0.26 m MV PHT:        60.32 msec MV Decel Time: 208 msec MV E velocity: 66.50 cm/s 103 cm/s MV A velocity: 89.20 cm/s 70.3 cm/s MV E/A ratio:  0.75       1.5  Harold Hedge MD Electronically signed by Harold Hedge MD Signature Date/Time: 03/19/2019/11:04:07 AM    Final     Assessment & Plan    73 year old female presented with rapid heart rate.  Noted to be Covid positive.  There is ruled out for myocardial infarction.  Converted to sinus rhythm with IV Cardizem drip.  Currently hemodynamically stable.  No bleeding history.  Atrial fibrillation-converted to sinus rhythm.  Echo showed preserved LV function.  No significant valvular abnormality.  We will switch to Cardizem CD 120 mg daily and continue with carvedilol 12.5 mg twice daily.  Her CHA2DS2-VASc score is 3 currently will be increased  to 4 when she turns 74.  Her hasbled score is 2.  No bleeding.  Will discontinue heparin and placed on Eliquis at 5 mg twice daily.  Okay to consider discharge from a cardiac standpoint.  We will set up appointment with Dr. Darrold Junker  in 1 week.  Hypertension-continue with carvedilol at current dose and lisinopril along with Cardizem.  Hyperlipidemia-continue with simvastatin at current dose   Signed, [name] A. Suresh Audi MD 03/19/2019, 1:03 PM  Pager: [phone]

## 2019-03-19 NOTE — Progress Notes (Addendum)
Beth Tyler for Heparin Drip Indication: atrial fibrillation  Allergies  Allergen Reactions  . Hydrocodone-Acetaminophen Anaphylaxis  . Percocet [Oxycodone-Acetaminophen] Anaphylaxis  . Demeclocycline Nausea Only  . Penicillins Nausea Only    Did it involve swelling of the face/tongue/throat, SOB, or low BP? No Did it involve sudden or severe rash/hives, skin peeling, or any reaction on the inside of your mouth or nose? No Did you need to seek medical attention at a hospital or doctor's office? No When did it last happen? If all above answers are "NO", may proceed with cephalosporin use.  . Tetracyclines & Related Nausea Only    Patient Measurements: Height: 5\' 2"  (157.5 cm) Weight: 216 lb (98 kg) IBW/kg (Calculated) : 50.1 Heparin Dosing Weight: 73.2 kg  Vital Signs: BP: 150/49 (12/11 0830) Pulse Rate: 72 (12/11 0830)  Labs: Recent Labs    03/18/19 1053 03/18/19 1059 03/19/19 0527 03/19/19 0805  HGB  --  13.4 12.9  --   HCT  --  40.0 39.6  --   PLT  --  216 224  --   APTT 31  --   --   --   LABPROT 12.7  --   --   --   INR 1.0  --   --   --   HEPARINUNFRC  --   --   --  0.55  CREATININE  --  0.82 1.05*  --     Estimated Creatinine Clearance: 51.4 mL/min (A) (by C-G formula based on SCr of 1.05 mg/dL (H)).   Medical History: Past Medical History:  Diagnosis Date  . Anemia   . Arthritis   . B12 deficiency   . Carpal tunnel syndrome, bilateral   . Diabetes mellitus ([place]) 02/17/2012  . Diabetes mellitus, type II ([place])   . Dyspnea   . Gastritis   . GERD (gastroesophageal reflux disease)   . Hypercholesterolemia   . Hypertension   . IBS (irritable bowel syndrome)   . Neuropathy    legs  . Pernicious anemia   . Vertigo    no episodes fo 9 or 10 yrs    Medications:  Scheduled:  . carvedilol  12.5 mg Oral BID WC  . diltiazem  30 mg Oral Q6H  . fluticasone furoate-vilanterol  1 puff Inhalation Daily  .  insulin aspart  0-15 Units Subcutaneous TID WC  . insulin aspart  0-5 Units Subcutaneous QHS  . insulin glargine  30 Units Subcutaneous Daily  . lipase/protease/amylase  36,000 Units Oral TID WC  . lisinopril  20 mg Oral Daily  . magnesium oxide  400 mg Oral Daily  . metoprolol tartrate  25 mg Oral BID  . mometasone-formoterol  2 puff Inhalation BID  . pioglitazone  15 mg Oral Daily  . simvastatin  10 mg Oral q1800  . sodium chloride flush  3 mL Intravenous Q12H   Infusions:  . sodium chloride    . diltiazem (CARDIZEM) infusion Stopped (03/18/19 2140)  . heparin 1,100 Units/hr (03/19/19 9628)    Assessment: 73 yo F to start Heparin drip for Afib No anticoagulants PTA noted per Med Rec  Heparin level was supposed to be drawn at 2000 on 12/10, but that was not done. Per nurse,prior to shift change, HL was not seen.  12/11 @0805  HL 0.55. Therapeutic. Confirmed with ED nurse no heparin interruptions or bleeding concerns.     Goal of Therapy:  Heparin level 0.3-0.7 units/ml Monitor platelets by anticoagulation protocol:  Yes   Plan:  Will continue current heparin rate of 1100 units/hr.  Check anti-Xa level in 8 hours and daily while on heparin Continue to monitor H&H and platelets  Darvis Croft R Lea Baine 03/19/2019,9:22 AM

## 2019-03-19 NOTE — Consult Note (Signed)
Remdesivir - Pharmacy Brief Note   O:  ALT: 24 CXR: evidence of lower respiratory infection on chest imaging SpO2: 93% on RA   A/P:  Remdesivir 200 mg IVPB once followed by 100 mg IVPB daily x 4 days.   Lu Duffel, PharmD, BCPS Clinical Pharmacist 03/19/2019 2:41 PM

## 2019-03-19 NOTE — Progress Notes (Signed)
*  PRELIMINARY RESULTS* Echocardiogram 2D Echocardiogram has been performed.  Beth Tyler 03/19/2019, 10:28 AM

## 2019-03-19 NOTE — ED Notes (Signed)
Pt ambulated to the bathroom with standby assist 

## 2019-03-20 ENCOUNTER — Encounter: Payer: Self-pay | Admitting: Internal Medicine

## 2019-03-20 LAB — CBC
HCT: 37.9 % (ref 36.0–46.0)
Hemoglobin: 12.2 g/dL (ref 12.0–15.0)
MCH: 28.4 pg (ref 26.0–34.0)
MCHC: 32.2 g/dL (ref 30.0–36.0)
MCV: 88.3 fL (ref 80.0–100.0)
Platelets: 205 10*3/uL (ref 150–400)
RBC: 4.29 MIL/uL (ref 3.87–5.11)
RDW: 13.2 % (ref 11.5–15.5)
WBC: 6.4 10*3/uL (ref 4.0–10.5)
nRBC: 0 % (ref 0.0–0.2)

## 2019-03-20 LAB — GLUCOSE, CAPILLARY
Glucose-Capillary: 216 mg/dL — ABNORMAL HIGH (ref 70–99)
Glucose-Capillary: 303 mg/dL — ABNORMAL HIGH (ref 70–99)
Glucose-Capillary: 216 mg/dL — ABNORMAL HIGH (ref 70–99)
Glucose-Capillary: 219 mg/dL — ABNORMAL HIGH (ref 70–99)

## 2019-03-20 LAB — BASIC METABOLIC PANEL
Anion gap: 11 (ref 5–15)
BUN: 23 mg/dL (ref 8–23)
CO2: 24 mmol/L (ref 22–32)
Calcium: 8.9 mg/dL (ref 8.9–10.3)
Chloride: 104 mmol/L (ref 98–111)
Creatinine, Ser: 0.96 mg/dL (ref 0.44–1.00)
GFR calc Af Amer: >60 mL/min (ref >60)
GFR calc non Af Amer: 58 mL/min — ABNORMAL LOW (ref >60)
Glucose, Bld: 234 mg/dL — ABNORMAL HIGH (ref 70–99)
Potassium: 3.7 mmol/L (ref 3.5–5.1)
Sodium: 139 mmol/L (ref 135–145)

## 2019-03-20 LAB — HIGH SENSITIVITY CRP: CRP, High Sensitivity: 3.32 mg/L — ABNORMAL HIGH (ref 0.00–3.00)

## 2019-03-20 NOTE — Progress Notes (Signed)
Patient Name: Beth Tyler Date of Encounter: 03/20/2019  Hospital Problem List     Principal Problem:   New onset atrial fibrillation [facility]) Active Problems:   Hypertension   Hyperlipidemia   Chronic hyperkalemia   BMI 40.0-44.9, adult (HCC)   Lymphedema   CKD (chronic kidney disease), stage III   Controlled type 2 diabetes mellitus with hyperglycemia (HCC)   Chronic stasis dermatitis   Diabetes-pancreatic exocrine dysfunction syndrome (HCC)   ILD (interstitial lung disease) [facility])    Patient Profile     73 year old female with history of hypertension chronic kidney disease and lymphedema admitted after complaining of palpitations.  Noted to be in A. fib with RVR.  Was COVID-19 positive.  Has converted back to sinus rhythm  Subjective   No complaints today.  Inpatient Medications    . apixaban  5 mg Oral BID  . carvedilol  12.5 mg Oral BID WC  . dexamethasone  4 mg Oral Q12H  . diltiazem  120 mg Oral Daily  . insulin aspart  0-20 Units Subcutaneous TID WC  . insulin aspart  0-5 Units Subcutaneous QHS  . insulin glargine  30 Units Subcutaneous Daily  . lipase/protease/amylase  36,000 Units Oral TID WC  . lisinopril  20 mg Oral Daily  . magnesium oxide  400 mg Oral Daily  . pioglitazone  15 mg Oral Daily  . simvastatin  10 mg Oral q1800  . sodium chloride flush  3 mL Intravenous Q12H    Vital Signs    Vitals:   03/20/19 0112 03/20/19 0210 03/20/19 0541 03/20/19 0753  BP: 136/69 140/62 (!) 156/64 (!) 144/61  Pulse: 71 68 65 67  Resp: Temp:  98.4 F (36.9 C) 98.4 F (36.9 C) 98 F (36.7 C)  TempSrc:  Oral Oral Oral  SpO2: 97% 94% 98% 93%  Weight:  95.5 kg    Height:        Intake/Output Summary (Last 24 hours) at 03/20/2019 0929 Last data filed at 03/20/2019 0211 Gross per 24 hour  Intake 393 ml  Output 0 ml  Net 393 ml   Filed Weights   03/18/19 1032 03/20/19 0210  Weight: 98 kg 95.5 kg    Physical Exam    GEN: Well  nourished, well developed, in no acute distress.  HEENT: normal.  Neck: Supple, no JVD, carotid bruits, or masses. Cardiac: RRR, no murmurs, rubs, or gallops. No clubbing, cyanosis, edema.  Radials/DP/PT 2+ and equal bilaterally.  Respiratory:  Respirations regular and unlabored, clear to auscultation bilaterally. GI: Soft, nontender, nondistended, BS + x 4. MS: no deformity or atrophy. Skin: warm and dry, no rash. Neuro:  Strength and sensation are intact. Psych: Normal affect.  Labs    CBC Recent Labs    03/18/19 1059 03/19/19 0527 03/20/19 0454  WBC 9.4 11.6* 6.4  NEUTROABS 5.9  --   --   HGB 13.4 12.9 12.2  HCT 40.0 39.6 37.9  MCV 86.8 88.2 88.3  PLT 216 224 205   Basic Metabolic Panel Recent Labs    16/10/96 1059 03/19/19 0527 03/19/19 0824 03/20/19 0454  NA 141 141  --  139  K 3.9 3.7  --  3.7  CL 104 105  --  104  CO2 25 23  --  24  GLUCOSE 233* 175*  --  234*  BUN 17 21  --  23  CREATININE 0.82 1.05*  --  0.96  CALCIUM 9.2 9.1  --  8.9  MG 1.5*  --  1.9  --    Liver Function Tests Recent Labs    03/18/19 1059  AST 21  ALT 24  ALKPHOS 85  BILITOT 0.8  PROT 7.5  ALBUMIN 4.2   Recent Labs    03/18/19 1059  LIPASE 20   Cardiac Enzymes No results for input(s): CKTOTAL, CKMB, CKMBINDEX, TROPONINI in the last 72 hours. BNP Recent Labs    03/18/19 1059  BNP 242.0*   D-Dimer No results for input(s): DDIMER in the last 72 hours. Hemoglobin A1C Recent Labs    03/19/19 0527  HGBA1C 8.3*   Fasting Lipid Panel No results for input(s): CHOL, HDL, LDLCALC, TRIG, CHOLHDL, LDLDIRECT in the last 72 hours. Thyroid Function Tests Recent Labs    03/19/19 0527  TSH 2.077    Telemetry    Sinus rhythm  ECG    A. fib with RVR converted to sinus rhythm  Radiology    DG Chest Portable 1 View  Result Date: 03/18/2019 CLINICAL DATA:  Palpitations, new onset AFib, SOB. EXAM: PORTABLE CHEST 1 VIEW COMPARISON:  No prior. FINDINGS: Mediastinum  and hilar structures normal. Lungs are clear. No pleural effusion or pneumothorax. IMPRESSION: No acute cardiopulmonary disease.  Exam stable from prior exam. Electronically Signed   By: Maisie Fushomas  Register   On: 03/18/2019 11:17   ECHOCARDIOGRAM COMPLETE  Result Date: 03/19/2019   ECHOCARDIOGRAM REPORT   [facility] Date of Exam: 03/19/2019 Medical Rec #:  [id number]        Height:       62.0 in Accession #:    [id number]       Weight:       216.0 lb Date of Birth:  1945-04-25        BSA:          1.98 m Patient Age:    73 years         BP:           161/46 mmHg Patient Gender: F                HR:           79 bpm. Exam Location:  [facility] Procedure: 2D Echo, Color Doppler and Cardiac Doppler Indications:     I48.91 Atrial fibrillation  History:         Patient has no prior history of Echocardiogram examinations.                  Risk Factors:Hypertension, Diabetes and HCL.  Sonographer:     Humphrey RollsJoan Heiss RDCS (AE) Referring Phys:  [id number] Dalia HeadingKENNETH A Keasia Dubose Diagnosing Phys: Harold HedgeKenneth Lorcan Shelp MD  Sonographer Comments: Suboptimal subcostal window. IMPRESSIONS  1. Left ventricular ejection fraction, by visual estimation, is 65 to 70%. The left ventricle has normal function. Left ventricular septal wall thickness was mildly increased. Mildly increased left ventricular posterior wall thickness. There is mildly increased left ventricular hypertrophy.  2. Global right ventricle has normal systolic function.The right ventricular size is normal. No increase in right ventricular wall thickness.  3. Left atrial size was normal.  4. Right atrial size was normal.  5. The mitral valve is grossly normal. Trivial mitral valve regurgitation.  6. The tricuspid valve is grossly normal. Tricuspid valve regurgitation is trivial.  7. The aortic valve is grossly normal. Aortic valve regurgitation is trivial.  8. The pulmonic valve was not well visualized. Pulmonic valve regurgitation is trivial.  9.  The atrial septum is grossly  normal. FINDINGS  Left Ventricle: Left ventricular ejection fraction, by visual estimation, is 65 to 70%. The left ventricle has normal function. The left ventricle is not well visualized. Mildly increased left ventricular posterior wall thickness. There is mildly increased left ventricular hypertrophy. Right Ventricle: The right ventricular size is normal. No increase in right ventricular wall thickness. Global RV systolic function is has normal systolic function. Left Atrium: Left atrial size was normal in size. Right Atrium: Right atrial size was normal in size Pericardium: There is no evidence of pericardial effusion. Mitral Valve: The mitral valve is grossly normal. Trivial mitral valve regurgitation. MV peak gradient, 5.0 mmHg. Tricuspid Valve: The tricuspid valve is grossly normal. Tricuspid valve regurgitation is trivial. Aortic Valve: The aortic valve is grossly normal. Aortic valve regurgitation is trivial. Aortic valve mean gradient measures 5.0 mmHg. Aortic valve peak gradient measures 12.7 mmHg. Aortic valve area, by VTI measures 1.32 cm. Pulmonic Valve: The pulmonic valve was not well visualized. Pulmonic valve regurgitation is trivial. Pulmonic regurgitation is trivial. Aorta: The aortic root is normal in size and structure. IAS/Shunts: The atrial septum is grossly normal.  LEFT VENTRICLE PLAX 2D LVIDd:         3.21 cm       Diastology LVIDs:         2.40 cm       LV e' lateral:   6.96 cm/s LV PW:         0.90 cm       LV E/e' lateral: 9.6 LV IVS:        0.90 cm       LV e' medial:    6.09 cm/s LVOT diam:     1.70 cm       LV E/e' medial:  10.9 LV SV:         21 ml LV SV Index:   9.95 LVOT Area:     2.27 cm  LV Volumes (MOD) LV area d, A2C:    24.40 cm LV area d, A4C:    23.60 cm LV area s, A2C:    15.10 cm LV area s, A4C:    16.90 cm LV major d, A2C:   7.50 cm LV major d, A4C:   6.60 cm LV major s, A2C:   6.60 cm LV major s, A4C:   6.76 cm LV vol d, MOD A2C: 65.6 ml LV vol d, MOD A4C: 69.4 ml  LV vol s, MOD A2C: 29.1 ml LV vol s, MOD A4C: 35.1 ml LV SV MOD A2C:     36.5 ml LV SV MOD A4C:     69.4 ml LV SV MOD BP:      39.7 ml LEFT ATRIUM             Index LA diam:        2.60 cm 1.32 cm/m LA Vol (A2C):   32.5 ml 16.45 ml/m LA Vol (A4C):   25.2 ml 12.76 ml/m LA Biplane Vol: 29.0 ml 14.68 ml/m  AORTIC VALVE                    PULMONIC VALVE AV Area (Vmax):    1.24 cm     PV Vmax:       1.10 m/s AV Area (Vmean):   1.37 cm     PV Vmean:      72.800 cm/s AV Area (VTI):     1.32 cm  PV VTI:        0.175 m AV Vmax:           178.00 cm/s  PV Peak grad:  4.8 mmHg AV Vmean:          109.000 cm/s PV Mean grad:  2.0 mmHg AV VTI:            0.324 m AV Peak Grad:      12.7 mmHg AV Mean Grad:      5.0 mmHg LVOT Vmax:         97.00 cm/s LVOT Vmean:        66.000 cm/s LVOT VTI:          0.189 m LVOT/AV VTI ratio: 0.58  AORTA Ao Root diam: 2.90 cm MITRAL VALVE MV Area (PHT): 3.65 cm             SHUNTS MV Peak grad:  5.0 mmHg             Systemic VTI:  0.19 m MV Mean grad:  3.0 mmHg             Systemic Diam: 1.70 cm MV Vmax:       1.12 m/s MV Vmean:      76.8 cm/s MV VTI:        0.26 m MV PHT:        60.32 msec MV Decel Time: 208 msec MV E velocity: 66.50 cm/s 103 cm/s MV A velocity: 89.20 cm/s 70.3 cm/s MV E/A ratio:  0.75       1.5  Bartholome Bill MD Electronically signed by Bartholome Bill MD Signature Date/Time: 03/19/2019/11:04:07 AM    Final     Assessment & Plan    Atrial fibrillation-currently in sinus rhythm.  Continue with Eliquis at 5 mg twice daily and carvedilol 12.5 mg twice daily and Cardizem CD 120 daily.  COVID-19-on steroids and remdesivir.  Signed, Javier Docker Sameerah Nachtigal MD 03/20/2019, 9:29 AM  Pager: [phone]

## 2019-03-20 NOTE — Assessment & Plan Note (Signed)
Blood pressure varying.  Has been overall under better control.  Follow pressures.  Follow metabolic panel.

## 2019-03-20 NOTE — Assessment & Plan Note (Signed)
On simvastatin.   

## 2019-03-20 NOTE — Assessment & Plan Note (Signed)
Controlled on protonix.   

## 2019-03-20 NOTE — Assessment & Plan Note (Signed)
Being followed by GI 

## 2019-03-20 NOTE — Progress Notes (Signed)
PROGRESS NOTE    Beth Tyler  ZOX:[id number] DOB: Jan 14, 1946 DOA: 03/18/2019 PCP: Dale DurhamScott, Charlene, MD   Brief Narrative:  73 year old female with CKD 3, pancreatic insufficiency, chronic lymphedema, stasis dermatitis, hypertension, obesity came to the hospital with chief complaint of palpitations, found to be in A. fib with RVR with rates into the 120s-140s.  Cardiology was consulted.  She was placed on diltiazem drip. Also found to have positive COVID-19.  Elevated inflammatory markers today. Started her on Decadron and remdesivir.  Subjective: Was feeling better when seen this morning.  Denies any palpitation, shortness of breath or chest pain.  Assessment & Plan:   Principal Problem:   New onset atrial fibrillation (HCC) Active Problems:   Hypertension   Hyperlipidemia   Chronic hyperkalemia   BMI 40.0-44.9, adult (HCC)   Lymphedema   CKD (chronic kidney disease), stage III   Controlled type 2 diabetes mellitus with hyperglycemia (HCC)   Chronic stasis dermatitis   Diabetes-pancreatic exocrine dysfunction syndrome (HCC)   ILD (interstitial lung disease) (HCC)  New onset atrial fibrillation with RVR .  Patient was converted to sinus rhythm with Cardizem infusion.  [place] score of 3 which will increase to 4 once she turns 74. Cardiology is following-appreciate their recommendations Echo was within normal limit. -Continue carvedilol and Cardizem. -Continue Eliquis.  COVID-19 infection.  Because of her new onset A. Fib. Elevated inflammatory markers so she was started on remdesivir and Decadron-day 3 today.  Hypertension Currently controlled. Continuing home dose of carvedilol and lisinopril. Holding HCTZ and Norvasc as patient is being started on diltiazem. HCTZ can be restarted if needed.  Diabetic nephropathy.  Creatinine around 1. -Continue to monitor.  Type 2 diabetes.  Uncontrolled with A1c of 8.3. Currently being controlled with Lantus and SSI. -SSI to  resistant scale as patient will get some steroid.  Pancreatic insufficiency/diabetes pancreatic exocrine dysfunction syndrome -Therapeutic substitution for preadmission [name]  Interstitial lung disease -Therapeutic substitution of Dulera for Advair and continue Breo Ellipta -Not chronically hypoxemic and currently stable on room air  Chronic lymphedema and stasis dermatitis -Lower extremity stable and at baseline per patient   HLD -Continue preadmission Zocor.  Objective: Vitals:   03/20/19 0112 03/20/19 0210 03/20/19 0541 03/20/19 0753  BP: 136/69 140/62 (!) 156/64 (!) 144/61  Pulse: 71 68 65 67  Resp: 18 18 18 18   Temp:  98.4 F (36.9 C) 98.4 F (36.9 C) 98 F (36.7 C)  TempSrc:  Oral Oral Oral  SpO2: 97% 94% 98% 93%  Weight:  95.5 kg    Height:        Intake/Output Summary (Last 24 hours) at 03/20/2019 1352 Last data filed at 03/20/2019 1100 Gross per 24 hour  Intake 293 ml  Output 0 ml  Net 293 ml   Filed Weights   03/18/19 1032 03/20/19 0210  Weight: 98 kg 95.5 kg    Examination:  General exam: Appears calm and comfortable  Respiratory system: Clear to auscultation. Respiratory effort normal. Cardiovascular system: S1 & S2 heard, RRR. No JVD, murmurs, rubs, gallops or clicks. 1+ pedal edema. Gastrointestinal system: Abdomen is nondistended, soft and nontender. No organomegaly or masses felt. Normal bowel sounds heard. Central nervous system: Alert and oriented. No focal neurological deficits. Extremities: Symmetric 5 x 5 power. Skin: Signs of bilateral lower extremity stasis dermatitis. Psychiatry: Judgement and insight appear normal. Mood & affect appropriate.   DVT prophylaxis: Eliquis Code Status: Full Family Communication: Updated the patient.  No family at bedside  Disposition Plan: Pending completion of remdesivir.  Consultants:   Cardiology  Procedures:  Antimicrobials:   Data Reviewed: I have personally reviewed following labs and  imaging studies  CBC: Recent Labs  Lab 03/18/19 1059 03/19/19 0527 03/20/19 0454  WBC 9.4 11.6* 6.4  NEUTROABS 5.9  --   --   HGB 13.4 12.9 12.2  HCT 40.0 39.6 37.9  MCV 86.8 88.2 88.3  PLT 216 224 205   Basic Metabolic Panel: Recent Labs  Lab 03/18/19 1059 03/19/19 0527 03/19/19 0824 03/20/19 0454  NA 141 141  --  139  K 3.9 3.7  --  3.7  CL 104 105  --  104  CO2 25 23  --  24  GLUCOSE 233* 175*  --  234*  BUN 17 21  --  23  CREATININE 0.82 1.05*  --  0.96  CALCIUM 9.2 9.1  --  8.9  MG 1.5*  --  1.9  --    GFR: Estimated Creatinine Clearance: 55.4 mL/min (by C-G formula based on SCr of 0.96 mg/dL). Liver Function Tests: Recent Labs  Lab 03/18/19 1059  AST 21  ALT 24  ALKPHOS 85  BILITOT 0.8  PROT 7.5  ALBUMIN 4.2   Recent Labs  Lab 03/18/19 1059  LIPASE 20   No results for input(s): AMMONIA in the last 168 hours. Coagulation Profile: Recent Labs  Lab 03/18/19 1053  INR 1.0   Cardiac Enzymes: No results for input(s): CKTOTAL, CKMB, CKMBINDEX, TROPONINI in the last 168 hours. BNP (last 3 results) No results for input(s): PROBNP in the last 8760 hours. HbA1C: Recent Labs    03/19/19 0527  HGBA1C 8.3*   CBG: Recent Labs  Lab 03/19/19 1216 03/19/19 1629 03/19/19 2158 03/20/19 0750 03/20/19 1142  GLUCAP 207* 204* 306* 216* 303*   Lipid Profile: No results for input(s): CHOL, HDL, LDLCALC, TRIG, CHOLHDL, LDLDIRECT in the last 72 hours. Thyroid Function Tests: Recent Labs    03/19/19 0527  TSH 2.077   Anemia Panel: Recent Labs    03/19/19 0527  FERRITIN 166   Sepsis Labs: No results for input(s): PROCALCITON, LATICACIDVEN in the last 168 hours.  Recent Results (from the past 240 hour(s))  SARS CORONAVIRUS 2 (TAT 6-24 HRS) Nasopharyngeal Nasopharyngeal Swab     Status: Abnormal   Collection Time: 03/18/19  1:21 PM   Specimen: Nasopharyngeal Swab  Result Value Ref Range Status   SARS Coronavirus 2 POSITIVE (A) NEGATIVE Final     Comment: RESULT CALLED TO, READ BACK BY AND VERIFIED WITH: R DAVID,RN 0108 03/19/2019 D BRADLEY (NOTE) SARS-CoV-2 target nucleic acids are DETECTED. The SARS-CoV-2 RNA is generally detectable in upper and lower respiratory specimens during the acute phase of infection. Positive results are indicative of the presence of SARS-CoV-2 RNA. Clinical correlation with patient history and other diagnostic information is  necessary to determine patient infection status. Positive results do not rule out bacterial infection or co-infection with other viruses.  The expected result is Negative. Fact Sheet for Patients: HairSlick.no Fact Sheet for Healthcare Providers: quierodirigir.com This test is not yet approved or cleared by the [place] FDA and  has been authorized for detection and/or diagnosis of SARS-CoV-2 by FDA under an Emergency Use Authorization (EUA). This EUA will remain  in effect (meaning this test can be used) for th e duration of the COVID-19 declaration under Section 564(b)(1) of the Act, 21 U.S.C. section 360bbb-3(b)(1), unless the authorization is terminated or revoked sooner. Performed at [facility], [address] [place], [place] [address]      Radiology Studies: ECHOCARDIOGRAM COMPLETE  Result Date: 03/19/2019   ECHOCARDIOGRAM REPORT   Patient Name:   SHEY [facility]OTT [facility] Date of Exam: 03/19/2019 Medical Rec #:  [id number]        Height:       62.0 in Accession #:    [id number]       Weight:       216.0 lb Date of Birth:  1945-06-24        BSA:          1.98 m Patient Age:    73 years         BP:           161/46 mmHg Patient Gender: F                HR:           79 bpm. Exam Location:  [facility] Procedure: 2D Echo, Color Doppler and Cardiac Doppler Indications:     I48.91 Atrial fibrillation  History:         Patient has no prior history of Echocardiogram examinations.                  Risk Factors:Hypertension,  Diabetes and HCL.  Sonographer:     Humphrey Rolls RDCS (AE) Referring Phys:  [id number] Dalia Heading Diagnosing Phys: Harold Hedge MD  Sonographer Comments: Suboptimal subcostal window. IMPRESSIONS  1. Left ventricular ejection fraction, by visual estimation, is 65 to 70%. The left ventricle has normal function. Left ventricular septal wall thickness was mildly increased. Mildly increased left ventricular posterior wall thickness. There is mildly increased left ventricular hypertrophy.  2. Global right ventricle has normal systolic function.The right ventricular size is normal. No increase in right ventricular wall thickness.  3. Left atrial size was normal.  4. Right atrial size was normal.  5. The mitral valve is grossly normal. Trivial mitral valve regurgitation.  6. The tricuspid valve is grossly normal. Tricuspid valve regurgitation is trivial.  7. The aortic valve is grossly normal. Aortic valve regurgitation is trivial.  8. The pulmonic valve was not well visualized. Pulmonic valve regurgitation is trivial.  9. The atrial septum is grossly normal. FINDINGS  Left Ventricle: Left ventricular ejection fraction, by visual estimation, is 65 to 70%. The left ventricle has normal function. The left ventricle is not well visualized. Mildly increased left ventricular posterior wall thickness. There is mildly increased left ventricular hypertrophy. Right Ventricle: The right ventricular size is normal. No increase in right ventricular wall thickness. Global RV systolic function is has normal systolic function. Left Atrium: Left atrial size was normal in size. Right Atrium: Right atrial size was normal in size Pericardium: There is no evidence of pericardial effusion. Mitral Valve: The mitral valve is grossly normal. Trivial mitral valve regurgitation. MV peak gradient, 5.0 mmHg. Tricuspid Valve: The tricuspid valve is grossly normal. Tricuspid valve regurgitation is trivial. Aortic Valve: The aortic valve is grossly normal.  Aortic valve regurgitation is trivial. Aortic valve mean gradient measures 5.0 mmHg. Aortic valve peak gradient measures 12.7 mmHg. Aortic valve area, by VTI measures 1.32 cm. Pulmonic Valve: The pulmonic valve was not well visualized. Pulmonic valve regurgitation is trivial. Pulmonic regurgitation is trivial. Aorta: The aortic root is normal in size and structure. IAS/Shunts: The atrial septum is grossly normal.  LEFT VENTRICLE PLAX 2D LVIDd:         3.21 cm  Diastology LVIDs:         2.40 cm       LV e' lateral:   6.96 cm/s LV PW:         0.90 cm       LV E/e' lateral: 9.6 LV IVS:        0.90 cm       LV e' medial:    6.09 cm/s LVOT diam:     1.70 cm       LV E/e' medial:  10.9 LV SV:         21 ml LV SV Index:   9.95 LVOT Area:     2.27 cm  LV Volumes (MOD) LV area d, A2C:    24.40 cm LV area d, A4C:    23.60 cm LV area s, A2C:    15.10 cm LV area s, A4C:    16.90 cm LV major d, A2C:   7.50 cm LV major d, A4C:   6.60 cm LV major s, A2C:   6.60 cm LV major s, A4C:   6.76 cm LV vol d, MOD A2C: 65.6 ml LV vol d, MOD A4C: 69.4 ml LV vol s, MOD A2C: 29.1 ml LV vol s, MOD A4C: 35.1 ml LV SV MOD A2C:     36.5 ml LV SV MOD A4C:     69.4 ml LV SV MOD BP:      39.7 ml LEFT ATRIUM             Index LA diam:        2.60 cm 1.32 cm/m LA Vol (A2C):   32.5 ml 16.45 ml/m LA Vol (A4C):   25.2 ml 12.76 ml/m LA Biplane Vol: 29.0 ml 14.68 ml/m  AORTIC VALVE                    PULMONIC VALVE AV Area (Vmax):    1.24 cm     PV Vmax:       1.10 m/s AV Area (Vmean):   1.37 cm     PV Vmean:      72.800 cm/s AV Area (VTI):     1.32 cm     PV VTI:        0.175 m AV Vmax:           178.00 cm/s  PV Peak grad:  4.8 mmHg AV Vmean:          109.000 cm/s PV Mean grad:  2.0 mmHg AV VTI:            0.324 m AV Peak Grad:      12.7 mmHg AV Mean Grad:      5.0 mmHg LVOT Vmax:         97.00 cm/s LVOT Vmean:        66.000 cm/s LVOT VTI:          0.189 m LVOT/AV VTI ratio: 0.58  AORTA Ao Root diam: 2.90 cm MITRAL VALVE MV Area (PHT):  3.65 cm             SHUNTS MV Peak grad:  5.0 mmHg             Systemic VTI:  0.19 m MV Mean grad:  3.0 mmHg             Systemic Diam: 1.70 cm MV Vmax:       1.12 m/s MV Vmean:      76.8 cm/s MV VTI:  0.26 m MV PHT:        60.32 msec MV Decel Time: 208 msec MV E velocity: 66.50 cm/s 103 cm/s MV A velocity: 89.20 cm/s 70.3 cm/s MV E/A ratio:  0.75       1.5  Bartholome Bill MD Electronically signed by Bartholome Bill MD Signature Date/Time: 03/19/2019/11:04:07 AM    Final     Scheduled Meds:  apixaban  5 mg Oral BID   carvedilol  12.5 mg Oral BID WC   dexamethasone  4 mg Oral Q12H   diltiazem  120 mg Oral Daily   insulin aspart  0-20 Units Subcutaneous TID WC   insulin aspart  0-5 Units Subcutaneous QHS   insulin glargine  30 Units Subcutaneous Daily   lipase/protease/amylase  36,000 Units Oral TID WC   lisinopril  20 mg Oral Daily   magnesium oxide  400 mg Oral Daily   pioglitazone  15 mg Oral Daily   simvastatin  10 mg Oral q1800   sodium chloride flush  3 mL Intravenous Q12H   Continuous Infusions:  sodium chloride     remdesivir 100 mg in NS 100 mL 100 mg (03/20/19 1027)     LOS: 2 days   Time spent: 35 minutes.    Lorella Nimrod, MD [facility] Pager [phone]  If 7PM-7AM, please contact night-coverage [email or web address] Password [facility] 03/20/2019, 1:52 PM   This record has been created using Dragon voice recognition software. Errors have been sought and corrected,but may not always be located. Such creation errors do not reflect on the standard of care.

## 2019-03-20 NOTE — Assessment & Plan Note (Signed)
Working with Catie.  No low sugars recently.  Hs lost weight.  Continue low carb diet and exercise.  Follow met b and a1c.

## 2019-03-20 NOTE — Assessment & Plan Note (Signed)
Continue B12 injections.   

## 2019-03-20 NOTE — Assessment & Plan Note (Signed)
On simvastatin.  Low cholesterol diet and exercise.  Follow lipid panel and liver function tests.   

## 2019-03-20 NOTE — Assessment & Plan Note (Signed)
Has been evaluated by [facility].  

## 2019-03-21 LAB — GLUCOSE, CAPILLARY
Glucose-Capillary: 146 mg/dL — ABNORMAL HIGH (ref 70–99)
Glucose-Capillary: 246 mg/dL — ABNORMAL HIGH (ref 70–99)
Glucose-Capillary: 324 mg/dL — ABNORMAL HIGH (ref 70–99)
Glucose-Capillary: 326 mg/dL — ABNORMAL HIGH (ref 70–99)

## 2019-03-21 MED ORDER — MAGNESIUM HYDROXIDE 400 MG/5ML PO SUSP
30.0000 mL | Freq: Every day | ORAL | Status: DC | PRN
  Administered 2019-03-21: 16:00:00 30 mL via ORAL
  Filled 2019-03-21: qty 30

## 2019-03-21 NOTE — Progress Notes (Signed)
Made dr. Reesa Chew aware patients heart rate is rangin 52-58, has scheduled coreg. Asked md if she wanted parameter of when to hold. Per md hold if heart rate is less than 80. Will continue to monitor

## 2019-03-21 NOTE — Progress Notes (Signed)
PROGRESS NOTE    Beth Tyler  BJY:[id number] DOB: November 19, 1945 DOA: 03/18/2019 PCP: Dale DurhamScott, Charlene, MD   Brief Narrative:  73 year old female with CKD 3, pancreatic insufficiency, chronic lymphedema, stasis dermatitis, hypertension, obesity came to the hospital with chief complaint of palpitations, found to be in A. fib with RVR with rates into the 120s-140s.  Cardiology was consulted.  She was placed on diltiazem drip. Also found to have positive COVID-19.  Elevated inflammatory markers today. Started her on Decadron and remdesivir.  Subjective: Was feeling better when seen this morning.  No new complaints.  Assessment & Plan:   Principal Problem:   New onset atrial fibrillation (HCC) Active Problems:   Hypertension   Hyperlipidemia   Chronic hyperkalemia   BMI 40.0-44.9, adult (HCC)   Lymphedema   CKD (chronic kidney disease), stage III   Controlled type 2 diabetes mellitus with hyperglycemia (HCC)   Chronic stasis dermatitis   Diabetes-pancreatic exocrine dysfunction syndrome (HCC)   ILD (interstitial lung disease) (HCC)  New onset atrial fibrillation with RVR .  Patient was converted to sinus rhythm with Cardizem infusion.  [place] vasc score of 3 which will increase to 4 once she turns 74. Cardiology is following-appreciate their recommendations Echo was within normal limit. -Continue carvedilol and Cardizem. -Continue Eliquis.  COVID-19 infection.  May be the cause of her new onset A. Fib. Elevated inflammatory markers so she was started on remdesivir and Decadron-day 4 today.  Hypertension Currently controlled. Continuing home dose of carvedilol and lisinopril. Holding HCTZ and Norvasc as patient is being started on diltiazem. HCTZ can be restarted if needed.  Diabetic nephropathy.  Creatinine around 1. -Continue to monitor.  Type 2 diabetes.  Uncontrolled with A1c of 8.3. Currently being controlled with Lantus and SSI. -SSI to resistant scale as patient  will get some steroid.  Pancreatic insufficiency/diabetes pancreatic exocrine dysfunction syndrome -Therapeutic substitution for preadmission [name]  Interstitial lung disease -Therapeutic substitution of Dulera for Advair and continue Breo Ellipta -Not chronically hypoxemic and currently stable on room air  Chronic lymphedema and stasis dermatitis -Lower extremity stable and at baseline per patient   HLD -Continue preadmission Zocor.  Objective: Vitals:   03/20/19 1625 03/20/19 2001 03/21/19 0345 03/21/19 0817  BP: 137/81 140/69 125/64 140/64  Pulse: 71 (!) 57 (!) 52 (!) 58  Resp: 18 18 18 16   Temp: 98.4 F (36.9 C) 97.9 F (36.6 C) 97.6 F (36.4 C) 98 F (36.7 C)  TempSrc:  Oral Oral Oral  SpO2: 99% 100% 94% 97%  Weight:   98.4 kg   Height:        Intake/Output Summary (Last 24 hours) at 03/21/2019 1408 Last data filed at 03/21/2019 1113 Gross per 24 hour  Intake 145.16 ml  Output --  Net 145.16 ml   Filed Weights   03/18/19 1032 03/20/19 0210 03/21/19 0345  Weight: 98 kg 95.5 kg 98.4 kg    Examination:  General exam: Appears calm and comfortable  Respiratory system: Clear to auscultation. Respiratory effort normal. Cardiovascular system: S1 & S2 heard, RRR. No JVD, murmurs, rubs, gallops or clicks. 1+ pedal edema. Gastrointestinal system: Abdomen is nondistended, soft and nontender. No organomegaly or masses felt. Normal bowel sounds heard. Central nervous system: Alert and oriented. No focal neurological deficits. Extremities: Symmetric 5 x 5 power. Skin: Signs of bilateral lower extremity stasis dermatitis. Psychiatry: Judgement and insight appear normal. Mood & affect appropriate.   DVT prophylaxis: Eliquis Code Status: Full Family Communication: Updated the patient.  No family at bedside Disposition Plan:  home tomorrow after completion of remdesivir.  Consultants:   Cardiology  Procedures:  Antimicrobials:   Data Reviewed: I have personally  reviewed following labs and imaging studies  CBC: Recent Labs  Lab 03/18/19 1059 03/19/19 0527 03/20/19 0454  WBC 9.4 11.6* 6.4  NEUTROABS 5.9  --   --   HGB 13.4 12.9 12.2  HCT 40.0 39.6 37.9  MCV 86.8 88.2 88.3  PLT 216 224 213   Basic Metabolic Panel: Recent Labs  Lab 03/18/19 1059 03/19/19 0527 03/19/19 0824 03/20/19 0454  NA 141 141  --  139  K 3.9 3.7  --  3.7  CL 104 105  --  104  CO2 25 23  --  24  GLUCOSE 233* 175*  --  234*  BUN 17 21  --  23  CREATININE 0.82 1.05*  --  0.96  CALCIUM 9.2 9.1  --  8.9  MG 1.5*  --  1.9  --    GFR: Estimated Creatinine Clearance: 56.3 mL/min (by C-G formula based on SCr of 0.96 mg/dL). Liver Function Tests: Recent Labs  Lab 03/18/19 1059  AST 21  ALT 24  ALKPHOS 85  BILITOT 0.8  PROT 7.5  ALBUMIN 4.2   Recent Labs  Lab 03/18/19 1059  LIPASE 20   No results for input(s): AMMONIA in the last 168 hours. Coagulation Profile: Recent Labs  Lab 03/18/19 1053  INR 1.0   Cardiac Enzymes: No results for input(s): CKTOTAL, CKMB, CKMBINDEX, TROPONINI in the last 168 hours. BNP (last 3 results) No results for input(s): PROBNP in the last 8760 hours. HbA1C: Recent Labs    03/19/19 0527  HGBA1C 8.3*   CBG: Recent Labs  Lab 03/20/19 1142 03/20/19 1625 03/20/19 2003 03/21/19 0818 03/21/19 1203  GLUCAP 303* 216* 219* 146* 246*   Lipid Profile: No results for input(s): CHOL, HDL, LDLCALC, TRIG, CHOLHDL, LDLDIRECT in the last 72 hours. Thyroid Function Tests: Recent Labs    03/19/19 0527  TSH 2.077   Anemia Panel: Recent Labs    03/19/19 0527  FERRITIN 166   Sepsis Labs: No results for input(s): PROCALCITON, LATICACIDVEN in the last 168 hours.  Recent Results (from the past 240 hour(s))  SARS CORONAVIRUS 2 (TAT 6-24 HRS) Nasopharyngeal Nasopharyngeal Swab     Status: Abnormal   Collection Time: 03/18/19  1:21 PM   Specimen: Nasopharyngeal Swab  Result Value Ref Range Status   SARS Coronavirus 2  POSITIVE (A) NEGATIVE Final    Comment: RESULT CALLED TO, READ BACK BY AND VERIFIED WITH: R DAVID,RN 0108 03/19/2019 D BRADLEY (NOTE) SARS-CoV-2 target nucleic acids are DETECTED. The SARS-CoV-2 RNA is generally detectable in upper and lower respiratory specimens during the acute phase of infection. Positive results are indicative of the presence of SARS-CoV-2 RNA. Clinical correlation with patient history and other diagnostic information is  necessary to determine patient infection status. Positive results do not rule out bacterial infection or co-infection with other viruses.  The expected result is Negative. Fact Sheet for Patients: SugarRoll.be Fact Sheet for Healthcare Providers: https://www.woods-mathews.com/ This test is not yet approved or cleared by the [place] FDA and  has been authorized for detection and/or diagnosis of SARS-CoV-2 by FDA under an Emergency Use Authorization (EUA). This EUA will remain  in effect (meaning this test can be used) for th e duration of the COVID-19 declaration under Section 564(b)(1) of the Act, 21 U.S.C. section 360bbb-3(b)(1), unless the authorization is terminated  or revoked sooner. Performed at [facility], [address] [place], [place] [address]      Radiology Studies: No results found.  Scheduled Meds: . apixaban  5 mg Oral BID  . carvedilol  12.5 mg Oral BID WC  . dexamethasone  4 mg Oral Q12H  . diltiazem  120 mg Oral Daily  . insulin aspart  0-20 Units Subcutaneous TID WC  . insulin aspart  0-5 Units Subcutaneous QHS  . insulin glargine  30 Units Subcutaneous Daily  . lipase/protease/amylase  36,000 Units Oral TID WC  . lisinopril  20 mg Oral Daily  . magnesium oxide  400 mg Oral Daily  . pioglitazone  15 mg Oral Daily  . simvastatin  10 mg Oral q1800  . sodium chloride flush  3 mL Intravenous Q12H   Continuous Infusions: . sodium chloride    . remdesivir 100 mg in  NS 100 mL Stopped (03/21/19 1016)     LOS: 3 days   Time spent: 35 minutes.    Arnetha Courser, MD [facility] Pager [phone]  If 7PM-7AM, please contact night-coverage [email or web address] Password [facility] 03/21/2019, 2:08 PM   This record has been created using Dragon voice recognition software. Errors have been sought and corrected,but may not always be located. Such creation errors do not reflect on the standard of care.

## 2019-03-22 LAB — COMPREHENSIVE METABOLIC PANEL
ALT: 42 U/L (ref 0–44)
AST: 26 U/L (ref 15–41)
Albumin: 3.9 g/dL (ref 3.5–5.0)
Alkaline Phosphatase: 76 U/L (ref 38–126)
Anion gap: 11 (ref 5–15)
BUN: 39 mg/dL — ABNORMAL HIGH (ref 8–23)
CO2: 25 mmol/L (ref 22–32)
Calcium: 8.9 mg/dL (ref 8.9–10.3)
Chloride: 101 mmol/L (ref 98–111)
Creatinine, Ser: 0.93 mg/dL (ref 0.44–1.00)
GFR calc Af Amer: >60 mL/min (ref >60)
GFR calc non Af Amer: >60 mL/min (ref >60)
Glucose, Bld: 184 mg/dL — ABNORMAL HIGH (ref 70–99)
Potassium: 4.2 mmol/L (ref 3.5–5.1)
Sodium: 137 mmol/L (ref 135–145)
Total Bilirubin: 0.7 mg/dL (ref 0.3–1.2)
Total Protein: 7.5 g/dL (ref 6.5–8.1)

## 2019-03-22 LAB — CBC
HCT: 39.7 % (ref 36.0–46.0)
Hemoglobin: 13 g/dL (ref 12.0–15.0)
MCH: 28.6 pg (ref 26.0–34.0)
MCHC: 32.7 g/dL (ref 30.0–36.0)
MCV: 87.4 fL (ref 80.0–100.0)
Platelets: 247 10*3/uL (ref 150–400)
RBC: 4.54 MIL/uL (ref 3.87–5.11)
RDW: 13.3 % (ref 11.5–15.5)
WBC: 14.9 10*3/uL — ABNORMAL HIGH (ref 4.0–10.5)
nRBC: 0 % (ref 0.0–0.2)

## 2019-03-22 LAB — GLUCOSE, CAPILLARY
Glucose-Capillary: 169 mg/dL — ABNORMAL HIGH (ref 70–99)
Glucose-Capillary: 359 mg/dL — ABNORMAL HIGH (ref 70–99)
Glucose-Capillary: 372 mg/dL — ABNORMAL HIGH (ref 70–99)

## 2019-03-22 MED ORDER — HYDROCHLOROTHIAZIDE 25 MG PO TABS
25.0000 mg | ORAL_TABLET | Freq: Every day | ORAL | Status: DC
  Administered 2019-03-22: 11:00:00 25 mg via ORAL
  Filled 2019-03-22: qty 1

## 2019-03-22 MED ORDER — APIXABAN 5 MG PO TABS: 5.0000 mg | ORAL_TABLET | Freq: Two times a day (BID) | ORAL | 1 refills | Status: DC

## 2019-03-22 MED ORDER — DEXAMETHASONE 4 MG PO TABS: 4.0000 mg | ORAL_TABLET | Freq: Every day | ORAL | 0 refills | Status: AC

## 2019-03-22 NOTE — Plan of Care (Signed)
Discharge instructions provided to pt.  All questions addressed.  Understanding verified through teach back.  Awaiting transportation home via POV.   Problem: Education: Goal: Knowledge of General Education information will improve Description: Including pain rating scale, medication(s)/side effects and non-pharmacologic comfort measures Outcome: Adequate for Discharge   Problem: Health Behavior/Discharge Planning: Goal: Ability to manage health-related needs will improve Outcome: Adequate for Discharge   Problem: Clinical Measurements: Goal: Ability to maintain clinical measurements within normal limits will improve Outcome: Adequate for Discharge Goal: Will remain free from infection Outcome: Adequate for Discharge Goal: Diagnostic test results will improve Outcome: Adequate for Discharge Goal: Respiratory complications will improve Outcome: Adequate for Discharge Goal: Cardiovascular complication will be avoided Outcome: Adequate for Discharge   Problem: Activity: Goal: Risk for activity intolerance will decrease Outcome: Adequate for Discharge   Problem: Nutrition: Goal: Adequate nutrition will be maintained Outcome: Adequate for Discharge   Problem: Coping: Goal: Level of anxiety will decrease Outcome: Adequate for Discharge   Problem: Elimination: Goal: Will not experience complications related to bowel motility Outcome: Adequate for Discharge Goal: Will not experience complications related to urinary retention Outcome: Adequate for Discharge   Problem: Pain Managment: Goal: General experience of comfort will improve Outcome: Adequate for Discharge   Problem: Safety: Goal: Ability to remain free from injury will improve Outcome: Adequate for Discharge   Problem: Skin Integrity: Goal: Risk for impaired skin integrity will decrease Outcome: Adequate for Discharge   Problem: Education: Goal: Knowledge of disease or condition will improve Outcome: Adequate  for Discharge Goal: Understanding of medication regimen will improve Outcome: Adequate for Discharge Goal: Individualized Educational Video(s) Outcome: Adequate for Discharge   Problem: Activity: Goal: Ability to tolerate increased activity will improve Outcome: Adequate for Discharge   Problem: Cardiac: Goal: Ability to achieve and maintain adequate cardiopulmonary perfusion will improve Outcome: Adequate for Discharge   Problem: Health Behavior/Discharge Planning: Goal: Ability to safely manage health-related needs after discharge will improve Outcome: Adequate for Discharge   Problem: Education: Goal: Knowledge of risk factors and measures for prevention of condition will improve Outcome: Adequate for Discharge   Problem: Coping: Goal: Psychosocial and spiritual needs will be supported Outcome: Adequate for Discharge   Problem: Respiratory: Goal: Will maintain a patent airway Outcome: Adequate for Discharge Goal: Complications related to the disease process, condition or treatment will be avoided or minimized Outcome: Adequate for Discharge

## 2019-03-22 NOTE — Discharge Instructions (Signed)
Atrial Fibrillation ° °Atrial fibrillation is a type of heartbeat that is irregular or fast (rapid). If you have this condition, your heart beats without any order. This makes it hard for your heart to pump blood in a normal way. Having this condition gives you more risk for stroke, heart failure, and other heart problems. °Atrial fibrillation may start all of a sudden and then stop on its own, or it may become a long-lasting problem. °What are the causes? °This condition may be caused by heart conditions, such as: °· High blood pressure. °· Heart failure. °· Heart valve disease. °· Heart surgery. °Other causes include: °· Pneumonia. °· Obstructive sleep apnea. °· Lung cancer. °· Thyroid disease. °· Drinking too much alcohol. °Sometimes the cause is not known. °What increases the risk? °You are more likely to develop this condition if: °· You smoke. °· You are older. °· You have diabetes. °· You are overweight. °· You have a family history of this condition. °· You exercise often and hard. °What are the signs or symptoms? °Common symptoms of this condition include: °· A feeling like your heart is beating very fast. °· Chest pain. °· Feeling short of breath. °· Feeling light-headed or weak. °· Getting tired easily. °Follow these instructions at home: °Medicines °· Take over-the-counter and prescription medicines only as told by your doctor. °· If your doctor gives you a blood-thinning medicine, take it exactly as told. Taking too much of it can cause bleeding. Taking too little of it does not protect you against clots. Clots can cause a stroke. °Lifestyle ° °  ° °· Do not use any tobacco products. These include cigarettes, chewing tobacco, and e-cigarettes. If you need help quitting, ask your doctor. °· Do not drink alcohol. °· Do not drink beverages that have caffeine. These include coffee, soda, and tea. °· Follow diet instructions as told by your doctor. °· Exercise regularly as told by your doctor. °General  instructions °· If you have a condition that causes breathing to stop for a short period of time (apnea), treat it as told by your doctor. °· Keep a healthy weight. Do not use diet pills unless your doctor says they are safe for you. Diet pills may make heart problems worse. °· Keep all follow-up visits as told by your doctor. This is important. °Contact a doctor if: °· You notice a change in the speed, rhythm, or strength of your heartbeat. °· You are taking a blood-thinning medicine and you see more bruising. °· You get tired more easily when you move or exercise. °· You have a sudden change in weight. °Get help right away if: ° °· You have pain in your chest or your belly (abdomen). °· You have trouble breathing. °· You have blood in your vomit, poop, or pee (urine). °· You have any signs of a stroke. "BE FAST" is an easy way to remember the main warning signs: °? B - Balance. Signs are dizziness, sudden trouble walking, or loss of balance. °? E - Eyes. Signs are trouble seeing or a change in how you see. °? F - Face. Signs are sudden weakness or loss of feeling in the face, or the face or eyelid drooping on one side. °? A - Arms. Signs are weakness or loss of feeling in an arm. This happens suddenly and usually on one side of the body. °? S - Speech. Signs are sudden trouble speaking, slurred speech, or trouble understanding what people say. °? T - Time.   Time to call emergency services. Write down what time symptoms started. °· You have other signs of a stroke, such as: °? A sudden, very bad headache with no known cause. °? Feeling sick to your stomach (nausea). °? Throwing up (vomiting). °? Jerky movements you cannot control (seizure). °These symptoms may be an emergency. Do not wait to see if the symptoms will go away. Get medical help right away. Call your local emergency services (911 in the [place] Do not drive yourself to the hospital. °Summary °· Atrial fibrillation is a type of heartbeat that is irregular  or fast (rapid). °· You are at higher risk of this condition if you smoke, are older, have diabetes, or are overweight. °· Follow your doctor's instructions about medicines, diet, exercise, and follow-up visits. °· Get help right away if you think that you have signs of a stroke. °This information is not intended to replace advice given to you by your health care provider. Make sure you discuss any questions you have with your health care provider. °Document Released: 01/02/2008 Document Revised: 05/29/2017 Document Reviewed: 05/16/2017 °Elsevier Patient Education © 2020 [employer] ° °

## 2019-03-22 NOTE — Progress Notes (Signed)
Inpatient Diabetes Program Recommendations  AACE/ADA: New Consensus Statement on Inpatient Glycemic Control (2015)  Target Ranges:  Prepandial:   less than 140 mg/dL      Peak postprandial:   less than 180 mg/dL (1-2 hours)      Critically ill patients:  140 - 180 mg/dL   Results for Beth Tyler, Beth Tyler (MRN [id number]) as of 03/22/2019 10:21  Ref. Range 03/21/2019 08:18 03/21/2019 12:03 03/21/2019 16:21 03/21/2019 20:26  Glucose-Capillary Latest Ref Range: 70 - 99 mg/dL 146 (H)  3 units NOVOLOG +  30 units LANTUS  246 (H)  7 units NOVOLOG  324 (H)  15 units NOVOLOG  326 (H)  4 units NOVOLOG    Results for Beth Tyler, Beth Tyler (MRN [id number]) as of 03/22/2019 10:21  Ref. Range 03/22/2019 08:27  Glucose-Capillary Latest Ref Range: 70 - 99 mg/dL 169 (H)   Results for Beth Tyler, Beth Tyler (MRN [id number]) as of 03/22/2019 10:21  Ref. Range 03/12/2019 10:17  Hemoglobin A1C Latest Ref Range: 4.6 - 6.5 % 7.8 (H)    Admit with: New onset atrial fibrillation w/ RVR/ COVID +  History: DM, CKD, Diabetes-pancreatic exocrine dysfunction syndrome  Home DM Meds: Basaglar 28 units Daily       [name] 4-8 units TID       Metformin XR 500 mg BID       Actos 15 mg Daily  Current Orders: Lantus 30 units Daily      Novolog Resistant Correction Scale/ SSI (0-20 units) TID AC + HS      Actos 15 mg Daily     Getting Decadron 4 mg BID.  Having elevated CBGs in the afternoon after meals.     MD- Please consider starting Novolog Meal Coverage:  Novolog 6 units TID with meals  (Please add the following Hold Parameters: Hold if pt eats <50% of meal, Hold if pt NPO)     --Will follow patient during hospitalization--  Wyn Quaker RN, MSN, CDE Diabetes Coordinator Inpatient Glycemic Control Team Team Pager: [phone] (8a-5p)

## 2019-03-22 NOTE — Discharge Summary (Signed)
Physician Discharge Summary  Beth Tyler:[id number] DOB: 08/12/1945 DOA: 03/18/2019  PCP: Einar Pheasant, MD  Admit date: 03/18/2019 Discharge date: 03/22/2019  Admitted From: Home Disposition:  Home  Recommendations for Outpatient Follow-up:  1. Follow up with PCP in 1-2 weeks 2. Please follow-up at A. fib clinic. 3. Please obtain BMP/CBC in one week 4. Please follow up on the following pending results:  Home Health: No Equipment/Devices: None Discharge Condition: Stable CODE STATUS: Full Diet recommendation: Heart Healthy / Carb Modified   Brief/Interim Summary: 73 year old female with CKD 3, pancreatic insufficiency, chronic lymphedema, stasis dermatitis, hypertension, obesity came to the hospital with chief complaint of palpitations, found to be in A. fib with RVR with rates into the 120s-140s.  She was initially managed with Cardizem drip and later with p.o. Cardizem with carvedilol.  She quickly converted back to sinus rhythm.  Her heart rate remained in low 50s, Cardizem was discontinued before discharge and she will continue with carvedilol.  Her [place] vas score was 3, will become for once turn 74.  She was started on Eliquis and will continue until sees her cardiologist.  No prior history of A. Fib. She was also found to have positive COVID-19 infection with elevated inflammatory markers.  She was given a course of remdesivir and Decadron and sent home for another 5 days of Decadron.  She will continue with other home meds.  Discharge Diagnoses:  Principal Problem:   New onset atrial fibrillation (HCC) Active Problems:   Hypertension   Hyperlipidemia   Chronic hyperkalemia   BMI 40.0-44.9, adult (HCC)   Lymphedema   CKD (chronic kidney disease), stage III   Controlled type 2 diabetes mellitus with hyperglycemia (HCC)   Chronic stasis dermatitis   Diabetes-pancreatic exocrine dysfunction syndrome (HCC)   ILD (interstitial lung disease) [facility])   Discharge  Instructions  Discharge Instructions    Amb referral to AFIB Clinic   Complete by: As directed    Diet - low sodium heart healthy   Complete by: As directed    Discharge instructions   Complete by: As directed    It was pleasure taking care of you. Please follow-up with your primary care and cardiologist within 1 to 2 weeks. Continue taking your home meds. I am giving you Decadron 4 mg daily for another 5 days due to your recent Covid infection. Please keep checking your blood sugar at home as steroid can increase them. Please keep your self quarantine for total of 14 days.   Increase activity slowly   Complete by: As directed      Allergies as of 03/22/2019      Reactions   Hydrocodone-acetaminophen Anaphylaxis   Percocet [oxycodone-acetaminophen] Anaphylaxis   Demeclocycline Nausea Only   Penicillins Nausea Only   Did it involve swelling of the face/tongue/throat, SOB, or low BP? No Did it involve sudden or severe rash/hives, skin peeling, or any reaction on the inside of your mouth or nose? No Did you need to seek medical attention at a hospital or doctor's office? No When did it last happen? If all above answers are "NO", may proceed with cephalosporin use.   Tetracyclines & Related Nausea Only      Medication List    TAKE these medications   acetaminophen 500 MG tablet Commonly known as: TYLENOL Take 500 mg by mouth every 6 (six) hours as needed.   amLODipine 10 MG tablet Commonly known as: NORVASC Take 1 tablet by mouth once daily   apixaban  5 MG Tabs tablet Commonly known as: ELIQUIS Take 1 tablet (5 mg total) by mouth 2 (two) times daily.   Basaglar KwikPen 100 UNIT/ML Sopn Inject 0.3 mLs (30 Units total) into the skin daily. What changed:   how much to take  when to take this   carvedilol 25 MG tablet Commonly known as: COREG TAKE 1 TABLET BY MOUTH TWICE DAILY WITH A MEAL What changed: See the new instructions.   dexamethasone 4 MG  tablet Commonly known as: DECADRON Take 1 tablet (4 mg total) by mouth daily for 5 days.   fluticasone 0.05 % cream Commonly known as: CUTIVATE USE AS DIRECTED What changed:   how much to take  how to take this  when to take this  reasons to take this  additional instructions   fluticasone furoate-vilanterol 200-25 MCG/INH Aepb Commonly known as: Breo Ellipta Inhale 1 puff into the lungs daily.   Fluticasone-Salmeterol 250-50 MCG/DOSE Aepb Commonly known as: Advair Diskus Inhale 1 puff into the lungs 2 (two) times daily.   hydrochlorothiazide 25 MG tablet Commonly known as: HYDRODIURIL Take 1 tablet (25 mg total) by mouth daily.   insulin lispro 100 UNIT/ML injection Commonly known as: [name] Inject up to 10 units TID with meals What changed:   how much to take  how to take this  when to take this  additional instructions   Insulin Pen Needle 32G X 4 MM Misc Use to inject insulin three times daily   lipase/protease/amylase 36000 UNITS Cpep capsule Commonly known as: [name] Take 72,000 Units by mouth 3 (three) times daily before meals. 2 capsules TID w/ meals and 1 w/ snack   lisinopril 20 MG tablet Commonly known as: ZESTRIL TAKE 1 TABLET BY MOUTH IN THE MORNING AND 2 AT BEDTIME What changed: See the new instructions.   magnesium oxide 400 (241.3 Mg) MG tablet Commonly known as: MAG-OX Take 1 tablet by mouth once daily   metFORMIN 500 MG 24 hr tablet Commonly known as: GLUCOPHAGE-XR Take 2 tablets by mouth twice daily What changed: how much to take   OneTouch Delica Lancets 85U Misc   OneTouch Verio Flex System w/Device Kit   OneTouch Verio test strip Generic drug: glucose blood USE 1 STRIP TO CHECK GLUCOSE TWICE DAILY   pantoprazole 40 MG tablet Commonly known as: PROTONIX TAKE 1 TABLET BY MOUTH TWICE DAILY BEFORE MEAL(S) What changed: See the new instructions.   pioglitazone 15 MG tablet Commonly known as: ACTOS Take 1 tablet by mouth  once daily   simvastatin 10 MG tablet Commonly known as: ZOCOR TAKE 1 TABLET BY MOUTH AT BEDTIME   triamcinolone cream 0.1 % Commonly known as: KENALOG APPLY  CREAM TOPICALLY TWICE DAILY      Follow-up Information    Paraschos, Alexander, MD Follow up in 1 week(s).   Specialty: Cardiology Contact information: [facility] [place] [address] [phone]          Allergies  Allergen Reactions  . Hydrocodone-Acetaminophen Anaphylaxis  . Percocet [Oxycodone-Acetaminophen] Anaphylaxis  . Demeclocycline Nausea Only  . Penicillins Nausea Only    Did it involve swelling of the face/tongue/throat, SOB, or low BP? No Did it involve sudden or severe rash/hives, skin peeling, or any reaction on the inside of your mouth or nose? No Did you need to seek medical attention at a hospital or doctor's office? No When did it last happen? If all above answers are "NO", may proceed with cephalosporin use.  Marland Kitchen  Tetracyclines & Related Nausea Only    Consultations:  Cardiology  Procedures/Studies: DG Chest Portable 1 View  Result Date: 03/18/2019 CLINICAL DATA:  Palpitations, new onset AFib, SOB. EXAM: PORTABLE CHEST 1 VIEW COMPARISON:  No prior. FINDINGS: Mediastinum and hilar structures normal. Lungs are clear. No pleural effusion or pneumothorax. IMPRESSION: No acute cardiopulmonary disease.  Exam stable from prior exam. Electronically Signed   By: Marcello Moores  Register   On: 03/18/2019 11:17   ECHOCARDIOGRAM COMPLETE  Result Date: 03/19/2019   ECHOCARDIOGRAM REPORT   Patient Name:   Beth Tyler [facility] Date of Exam: 03/19/2019 Medical Rec #:  [id number]        Height:       62.0 in Accession #:    [id number]       Weight:       216.0 lb Date of Birth:  01-31-1946        BSA:          1.98 m Patient Age:    15 years         BP:           161/46 mmHg Patient Gender: F                HR:           79 bpm. Exam Location:  [facility] Procedure: 2D Echo,  Color Doppler and Cardiac Doppler Indications:     I48.91 Atrial fibrillation  History:         Patient has no prior history of Echocardiogram examinations.                  Risk Factors:Hypertension, Diabetes and HCL.  Sonographer:     Charmayne Sheer RDCS (AE) Referring Phys:  [id number] Teodoro Spray Diagnosing Phys: Bartholome Bill MD  Sonographer Comments: Suboptimal subcostal window. IMPRESSIONS  1. Left ventricular ejection fraction, by visual estimation, is 65 to 70%. The left ventricle has normal function. Left ventricular septal wall thickness was mildly increased. Mildly increased left ventricular posterior wall thickness. There is mildly increased left ventricular hypertrophy.  2. Global right ventricle has normal systolic function.The right ventricular size is normal. No increase in right ventricular wall thickness.  3. Left atrial size was normal.  4. Right atrial size was normal.  5. The mitral valve is grossly normal. Trivial mitral valve regurgitation.  6. The tricuspid valve is grossly normal. Tricuspid valve regurgitation is trivial.  7. The aortic valve is grossly normal. Aortic valve regurgitation is trivial.  8. The pulmonic valve was not well visualized. Pulmonic valve regurgitation is trivial.  9. The atrial septum is grossly normal. FINDINGS  Left Ventricle: Left ventricular ejection fraction, by visual estimation, is 65 to 70%. The left ventricle has normal function. The left ventricle is not well visualized. Mildly increased left ventricular posterior wall thickness. There is mildly increased left ventricular hypertrophy. Right Ventricle: The right ventricular size is normal. No increase in right ventricular wall thickness. Global RV systolic function is has normal systolic function. Left Atrium: Left atrial size was normal in size. Right Atrium: Right atrial size was normal in size Pericardium: There is no evidence of pericardial effusion. Mitral Valve: The mitral valve is grossly normal. Trivial  mitral valve regurgitation. MV peak gradient, 5.0 mmHg. Tricuspid Valve: The tricuspid valve is grossly normal. Tricuspid valve regurgitation is trivial. Aortic Valve: The aortic valve is grossly normal. Aortic valve regurgitation is trivial. Aortic valve mean gradient measures 5.0 mmHg. Aortic valve peak gradient  measures 12.7 mmHg. Aortic valve area, by VTI measures 1.32 cm. Pulmonic Valve: The pulmonic valve was not well visualized. Pulmonic valve regurgitation is trivial. Pulmonic regurgitation is trivial. Aorta: The aortic root is normal in size and structure. IAS/Shunts: The atrial septum is grossly normal.  LEFT VENTRICLE PLAX 2D LVIDd:         3.21 cm       Diastology LVIDs:         2.40 cm       LV e' lateral:   6.96 cm/s LV PW:         0.90 cm       LV E/e' lateral: 9.6 LV IVS:        0.90 cm       LV e' medial:    6.09 cm/s LVOT diam:     1.70 cm       LV E/e' medial:  10.9 LV SV:         21 ml LV SV Index:   9.95 LVOT Area:     2.27 cm  LV Volumes (MOD) LV area d, A2C:    24.40 cm LV area d, A4C:    23.60 cm LV area s, A2C:    15.10 cm LV area s, A4C:    16.90 cm LV major d, A2C:   7.50 cm LV major d, A4C:   6.60 cm LV major s, A2C:   6.60 cm LV major s, A4C:   6.76 cm LV vol d, MOD A2C: 65.6 ml LV vol d, MOD A4C: 69.4 ml LV vol s, MOD A2C: 29.1 ml LV vol s, MOD A4C: 35.1 ml LV SV MOD A2C:     36.5 ml LV SV MOD A4C:     69.4 ml LV SV MOD BP:      39.7 ml LEFT ATRIUM             Index LA diam:        2.60 cm 1.32 cm/m LA Vol (A2C):   32.5 ml 16.45 ml/m LA Vol (A4C):   25.2 ml 12.76 ml/m LA Biplane Vol: 29.0 ml 14.68 ml/m  AORTIC VALVE                    PULMONIC VALVE AV Area (Vmax):    1.24 cm     PV Vmax:       1.10 m/s AV Area (Vmean):   1.37 cm     PV Vmean:      72.800 cm/s AV Area (VTI):     1.32 cm     PV VTI:        0.175 m AV Vmax:           178.00 cm/s  PV Peak grad:  4.8 mmHg AV Vmean:          109.000 cm/s PV Mean grad:  2.0 mmHg AV VTI:            0.324 m AV Peak Grad:      12.7  mmHg AV Mean Grad:      5.0 mmHg LVOT Vmax:         97.00 cm/s LVOT Vmean:        66.000 cm/s LVOT VTI:          0.189 m LVOT/AV VTI ratio: 0.58  AORTA Ao Root diam: 2.90 cm MITRAL VALVE MV Area (PHT): 3.65 cm  SHUNTS MV Peak grad:  5.0 mmHg             Systemic VTI:  0.19 m MV Mean grad:  3.0 mmHg             Systemic Diam: 1.70 cm MV Vmax:       1.12 m/s MV Vmean:      76.8 cm/s MV VTI:        0.26 m MV PHT:        60.32 msec MV Decel Time: 208 msec MV E velocity: 66.50 cm/s 103 cm/s MV A velocity: 89.20 cm/s 70.3 cm/s MV E/A ratio:  0.75       1.5  Bartholome Bill MD Electronically signed by Bartholome Bill MD Signature Date/Time: 03/19/2019/11:04:07 AM    Final     Subjective: Patient was feeling better when seen this morning.  Denies any chest pain, shortness of breath or palpitations.  Ready to be discharged.  Discharge Exam: Vitals:   03/22/19 0439 03/22/19 0828  BP: 120/60 (!) 150/68  Pulse: (!) 55 (!) 52  Resp:  16  Temp: 97.8 F (36.6 C) 98 F (36.7 C)  SpO2: 96% 98%   Vitals:   03/21/19 0817 03/21/19 1625 03/22/19 0439 03/22/19 0828  BP: 140/64 135/62 120/60 (!) 150/68  Pulse: (!) 58 (!) 53 (!) 55 (!) 52  Resp: _0 Temp: 98 F (36.7 C) 97.8 F (36.6 C) 97.8 F (36.6 C) 98 F (36.7 C)  TempSrc: Oral Oral Oral Oral  SpO2: 97% 97% 96% 98%  Weight:   95.9 kg   Height:        General: Pt is alert, awake, not in acute distress Cardiovascular: RRR, S1/S2 +, no rubs, no gallops Respiratory: CTA bilaterally, no wheezing, no rhonchi Abdominal: Soft, NT, ND, bowel sounds + Extremities: no edema, no cyanosis   The results of significant diagnostics from this hospitalization (including imaging, microbiology, ancillary and laboratory) are listed below for reference.    Microbiology: Recent Results (from the past 240 hour(s))  SARS CORONAVIRUS 2 (TAT 6-24 HRS) Nasopharyngeal Nasopharyngeal Swab     Status: Abnormal   Collection Time: 03/18/19  1:21 PM    Specimen: Nasopharyngeal Swab  Result Value Ref Range Status   SARS Coronavirus 2 POSITIVE (A) NEGATIVE Final    Comment: RESULT CALLED TO, READ BACK BY AND VERIFIED WITH: R DAVID,RN 0108 03/19/2019 D BRADLEY (NOTE) SARS-CoV-2 target nucleic acids are DETECTED. The SARS-CoV-2 RNA is generally detectable in upper and lower respiratory specimens during the acute phase of infection. Positive results are indicative of the presence of SARS-CoV-2 RNA. Clinical correlation with patient history and other diagnostic information is  necessary to determine patient infection status. Positive results do not rule out bacterial infection or co-infection with other viruses.  The expected result is Negative. Fact Sheet for Patients: SugarRoll.be Fact Sheet for Healthcare Providers: https://www.woods-mathews.com/ This test is not yet approved or cleared by the [place] FDA and  has been authorized for detection and/or diagnosis of SARS-CoV-2 by FDA under an Emergency Use Authorization (EUA). This EUA will remain  in effect (meaning this test can be used) for th e duration of the COVID-19 declaration under Section 564(b)(1) of the Act, 21 U.S.C. section 360bbb-3(b)(1), unless the authorization is terminated or revoked sooner. Performed at [facility], [place] [address], [place], [place] [address]      Labs: BNP (last 3 results) Recent Labs    03/18/19 1059  BNP 734.2*   Basic Metabolic Panel: Recent Labs  Lab 03/18/19 1059 03/19/19 0527 03/19/19 0824 03/20/19 0454 03/22/19 0515  NA 141 141  --  139 137  K 3.9 3.7  --  3.7 4.2  CL 104 105  --  104 101  CO2 25 23  --  24 25  GLUCOSE 233* 175*  --  234* 184*  BUN 17 21  --  23 39*  CREATININE 0.82 1.05*  --  0.96 0.93  CALCIUM 9.2 9.1  --  8.9 8.9  MG 1.5*  --  1.9  --   --    Liver Function Tests: Recent Labs  Lab 03/18/19 1059 03/22/19 0515  AST 21 26  ALT 24 42  ALKPHOS 85 76   BILITOT 0.8 0.7  PROT 7.5 7.5  ALBUMIN 4.2 3.9   Recent Labs  Lab 03/18/19 1059  LIPASE 20   No results for input(s): AMMONIA in the last 168 hours. CBC: Recent Labs  Lab 03/18/19 1059 03/19/19 0527 03/20/19 0454 03/22/19 0515  WBC 9.4 11.6* 6.4 14.9*  NEUTROABS 5.9  --   --   --   HGB 13.4 12.9 12.2 13.0  HCT 40.0 39.6 37.9 39.7  MCV 86.8 88.2 88.3 87.4  PLT 216 224 205 247   Cardiac Enzymes: No results for input(s): CKTOTAL, CKMB, CKMBINDEX, TROPONINI in the last 168 hours. BNP: Invalid input(s): POCBNP CBG: Recent Labs  Lab 03/21/19 0818 03/21/19 1203 03/21/19 1621 03/21/19 2026 03/22/19 0827  GLUCAP 146* 246* 324* 326* 169*   D-Dimer No results for input(s): DDIMER in the last 72 hours. Hgb A1c No results for input(s): HGBA1C in the last 72 hours. Lipid Profile No results for input(s): CHOL, HDL, LDLCALC, TRIG, CHOLHDL, LDLDIRECT in the last 72 hours. Thyroid function studies No results for input(s): TSH, T4TOTAL, T3FREE, THYROIDAB in the last 72 hours.  Invalid input(s): FREET3 Anemia work up No results for input(s): VITAMINB12, FOLATE, FERRITIN, TIBC, IRON, RETICCTPCT in the last 72 hours. Urinalysis    Component Value Date/Time   COLORURINE STRAW (A) 12/03/2017 1732   APPEARANCEUR CLEAR 12/03/2017 1732   LABSPEC 1.015 12/03/2017 1732   PHURINE 6.5 12/03/2017 1732   GLUCOSEU NEGATIVE 12/03/2017 1732   HGBUR TRACE (A) 12/03/2017 1732   BILIRUBINUR NEGATIVE 12/03/2017 1732   KETONESUR NEGATIVE 12/03/2017 1732   PROTEINUR 100 (A) 12/03/2017 1732   NITRITE NEGATIVE 12/03/2017 1732   LEUKOCYTESUR NEGATIVE 12/03/2017 1732   Sepsis Labs Invalid input(s): PROCALCITONIN,  WBC,  LACTICIDVEN Microbiology Recent Results (from the past 240 hour(s))  SARS CORONAVIRUS 2 (TAT 6-24 HRS) Nasopharyngeal Nasopharyngeal Swab     Status: Abnormal   Collection Time: 03/18/19  1:21 PM   Specimen: Nasopharyngeal Swab  Result Value Ref Range Status   SARS  Coronavirus 2 POSITIVE (A) NEGATIVE Final    Comment: RESULT CALLED TO, READ BACK BY AND VERIFIED WITH: R DAVID,RN 0108 03/19/2019 D BRADLEY (NOTE) SARS-CoV-2 target nucleic acids are DETECTED. The SARS-CoV-2 RNA is generally detectable in upper and lower respiratory specimens during the acute phase of infection. Positive results are indicative of the presence of SARS-CoV-2 RNA. Clinical correlation with patient history and other diagnostic information is  necessary to determine patient infection status. Positive results do not rule out bacterial infection or co-infection with other viruses.  The expected result is Negative. Fact Sheet for Patients: SugarRoll.be Fact Sheet for Healthcare Providers: https://www.woods-mathews.com/ This test is not yet approved or cleared by the [place] FDA and  has been authorized for detection and/or diagnosis of SARS-CoV-2 by FDA under an Emergency Use Authorization (EUA). This EUA will remain  in effect (meaning this test can be used) for th e duration of the COVID-19 declaration under Section 564(b)(1) of the Act, 21 U.S.C. section 360bbb-3(b)(1), unless the authorization is terminated or revoked sooner. Performed at [facility], [place] [address] [place], [place] [address]     Time coordinating discharge: Over 30 minutes  SIGNED:  Lorella Nimrod, MD  [facility] 03/22/2019, 11:28 AM Pager [phone]  If 7PM-7AM, please contact night-coverage [email or web address] Password [email or web address]  This record has been created using Systems analyst. Errors have been sought and corrected,but may not always be located. Such creation errors do not reflect on the standard of care.

## 2019-03-23 ENCOUNTER — Telehealth: Payer: Self-pay

## 2019-03-23 NOTE — Telephone Encounter (Signed)
Spoke with patient. Agrees to start taking decadron tomorrow morning, one time daily. HFU appointment scheduled and confirmed for Friday at 8:30.

## 2019-03-23 NOTE — Telephone Encounter (Signed)
Decadron is to be take one time daily. None has been taken today.  First dose given yesterday morning at the hospital, second dose taken last night taken at 8:00.  Reports all vitals WNL.

## 2019-03-23 NOTE — Telephone Encounter (Signed)
Transition Care Management Follow-up Telephone Call   Date discharged? 03/22/19   How have you been since you were released from the hospital? Patients states,"I think I am doing well." Denies chest pain, dizziness, headache, nausea, shortness of breath. Monitoring blood sugar with 149 before lunch.   Do you understand why you were in the hospital? Yes.   Do you understand the discharge instructions? Yes, increase activity slowly, quarantine 14 days, monitor BS.   Where were you discharged to? Home.   Items Reviewed:  Medications reviewed: Taking medications as directed at discharge. Will resume magnesium.   Allergies reviewed: Yes, none new.  Dietary changes reviewed: Yes, heart healthy/carb modified.  Referrals reviewed: Afib Clinic, Cardiology in 1 week.   Functional Questionnaire:   Activities of Daily Living (ADLs):   She states they are independent in the following: Independent in all ADLs.  States they require assistance with the following: Does not require assistance at this time.    Any transportation issues/concerns?: None at this time.   Any patient concerns? Patient will start decadron in the morning unless otherwise directed by pcp.    Confirmed importance and date/time of follow-up visits scheduled Yes, appointment scheduled via doxy 03/26/19 @ 8:30, [phone].   Provider Appointment booked with Dr. Nicki Reaper, pcp.  Confirmed with patient if condition begins to worsen call PCP or go to the ER.  Patient was given the office number and encouraged to call back with question or concerns.  : Yes.

## 2019-03-23 NOTE — Telephone Encounter (Signed)
Please ask her how she is supposed to be taking her decadron and then I can help to answer when she needs to take.  I am ok if she takes her magnesium this pm.  I am ok with working her in on Friday.  Confirm she is ok with Friday appt.  Thanks.

## 2019-03-23 NOTE — Telephone Encounter (Signed)
Unable to reach patient for transitional care management. No answer. Will follow as appropriate.  

## 2019-03-23 NOTE — Telephone Encounter (Signed)
Attempt made to reach patient. No answer. Will follow.

## 2019-03-23 NOTE — Telephone Encounter (Signed)
Per note, is taking one time per day, Have her start back on her decadron - tomorrow.

## 2019-03-26 ENCOUNTER — Telehealth: Payer: Self-pay | Admitting: Internal Medicine

## 2019-03-26 ENCOUNTER — Other Ambulatory Visit: Payer: Self-pay

## 2019-03-26 ENCOUNTER — Ambulatory Visit (INDEPENDENT_AMBULATORY_CARE_PROVIDER_SITE_OTHER): Payer: Medicare HMO | Admitting: Internal Medicine

## 2019-03-26 DIAGNOSIS — E785 Hyperlipidemia, unspecified: Secondary | ICD-10-CM

## 2019-03-26 DIAGNOSIS — U071 COVID-19: Secondary | ICD-10-CM

## 2019-03-26 DIAGNOSIS — I4891 Unspecified atrial fibrillation: Secondary | ICD-10-CM

## 2019-03-26 DIAGNOSIS — Z794 Long term (current) use of insulin: Secondary | ICD-10-CM

## 2019-03-26 DIAGNOSIS — E1140 Type 2 diabetes mellitus with diabetic neuropathy, unspecified: Secondary | ICD-10-CM | POA: Diagnosis not present

## 2019-03-26 DIAGNOSIS — I10 Essential (primary) hypertension: Secondary | ICD-10-CM | POA: Diagnosis not present

## 2019-03-26 DIAGNOSIS — K589 Irritable bowel syndrome without diarrhea: Secondary | ICD-10-CM | POA: Diagnosis not present

## 2019-03-26 NOTE — Progress Notes (Signed)
Patient ID: Beth Tyler, female   DOB: [birth year], 73 y.o.   MRN: [id number]   Virtual Visit via video Note  This visit type was conducted due to national recommendations for restrictions regarding the COVID-19 pandemic (e.g. social distancing).  This format is felt to be most appropriate for this patient at this time.  All issues noted in this document were discussed and addressed.  No physical exam was performed (except for noted visual exam findings with Video Visits).   I connected with [name] by a video enabled telemedicine application and verified that I am speaking with the correct person using two identifiers. Location patient: home Location provider: work  Persons participating in the virtual visit: patient, provider  I discussed the limitations, risks, security and privacy concerns of performing an evaluation and management service by video and the availability of in person appointments.  The patient expressed understanding and agreed to proceed.   Reason for visit: hospital follow up.   HPI: She was admitted 03/18/19 - 03/22/19 with palpitations and found to be in afib with RVR with rates in 120s-140s.  Initially managed with cardizem drip and then changed to oral cardizem.  Converted to SR.  Heart rate remained in 50s.  Oral cardizem was stopped and she was maintained on coreg.  Started on Eliquis.  While in hospital, she was diagnosed with covid.  Given remdesivir and decadron.  Discharged to continue decadron.  Since her discharge, she denies palpitations.  No chest pain.  Pulse rate - 60s. Blood pressure - ok.  Regarding covid, she reports scratchy throat and cough.  No increased congestion.  No fever.  No increased sob.  Decreased energy.  Bowels stable.  Discussed need for f/u with cardiology.      ROS: See pertinent positives and negatives per HPI.  Past Medical History:  Diagnosis Date  . Anemia   . Arthritis   . B12 deficiency   . Carpal tunnel syndrome,  bilateral   . Diabetes mellitus ([place]) 02/17/2012  . Diabetes mellitus, type II ([place])   . Dyspnea   . Gastritis   . GERD (gastroesophageal reflux disease)   . Hypercholesterolemia   . Hypertension   . IBS (irritable bowel syndrome)   . Neuropathy    legs  . Pernicious anemia   . Vertigo    no episodes fo 9 or 10 yrs    Past Surgical History:  Procedure Laterality Date  . ANTERIOR VITRECTOMY Right 12/01/2017   Procedure: ANTERIOR VITRECTOMY;  Surgeon: Eulogio Bear, MD;  Location: [facility];  Service: Ophthalmology;  Laterality: Right;  . BREAST BIOPSY Right 1980   neg  . BREAST SURGERY  1980   benign cyst removal  . CATARACT EXTRACTION W/PHACO Left 10/13/2017   Procedure: CATARACT EXTRACTION PHACO AND INTRAOCULAR LENS PLACEMENT ([place])  LEFT  DIABETES;  Surgeon: Eulogio Bear, MD;  Location: [facility];  Service: Ophthalmology;  Laterality: Left;  Diabetic - insulin and oral meds  . CATARACT EXTRACTION W/PHACO Right 12/01/2017   Procedure: CATARACT EXTRACTION PHACO AND INTRAOCULAR LENS PLACEMENT ([place])  RIGHT DIABETIC;  Surgeon: Eulogio Bear, MD;  Location: [facility];  Service: Ophthalmology;  Laterality: Right;  Diabetic - insulin and oral  . CHOLECYSTECTOMY  1983    Family History  Problem Relation Age of Onset  . Diabetes Father   . Kidney disease Father   . Heart disease Father   . Alcohol abuse Mother   . Dementia Mother   .  Diabetes Mother   . Diabetes Other   . Heart disease Son 85       CABG  . Hemophilia Brother   . Uterine cancer Sister   . Diabetes Sister   . COPD Sister   . Breast cancer Maternal Aunt 51  . Breast cancer Daughter 44  . Stroke Daughter     SOCIAL HX: reviewed.    Current Outpatient Medications:  .  acetaminophen (TYLENOL) 500 MG tablet, Take 500 mg by mouth every 6 (six) hours as needed., Disp: , Rfl:  .  amLODipine (NORVASC) 10 MG tablet, Take 1 tablet by mouth once daily, Disp: 90 tablet, Rfl:  0 .  apixaban (ELIQUIS) 5 MG TABS tablet, Take 1 tablet (5 mg total) by mouth 2 (two) times daily., Disp: 60 tablet, Rfl: 1 .  Blood Glucose Monitoring Suppl (ONETOUCH VERIO FLEX SYSTEM) w/Device KIT, , Disp: , Rfl: 0 .  carvedilol (COREG) 25 MG tablet, TAKE 1 TABLET BY MOUTH TWICE DAILY WITH A MEAL (Patient taking differently: Take 12.5 mg by mouth 2 (two) times daily with a meal. ), Disp: 180 tablet, Rfl: 0 .  fluticasone (CUTIVATE) 0.05 % cream, USE AS DIRECTED (Patient taking differently: Apply 1 application topically as needed. Use as directed.), Disp: 30 g, Rfl: 0 .  fluticasone furoate-vilanterol (BREO ELLIPTA) 200-25 MCG/INH AEPB, Inhale 1 puff into the lungs daily., Disp: 28 each, Rfl: 0 .  Fluticasone-Salmeterol (ADVAIR DISKUS) 250-50 MCG/DOSE AEPB, Inhale 1 puff into the lungs 2 (two) times daily., Disp: 60 each, Rfl: 3 .  hydrochlorothiazide (HYDRODIURIL) 25 MG tablet, Take 1 tablet (25 mg total) by mouth daily., Disp: 90 tablet, Rfl: 1 .  Insulin Glargine (BASAGLAR KWIKPEN) 100 UNIT/ML SOPN, Inject 0.3 mLs (30 Units total) into the skin daily. (Patient taking differently: Inject 28 Units into the skin every morning. ), Disp: 4 pen, Rfl: 1 .  insulin lispro ([name]) 100 UNIT/ML injection, Inject up to 10 units TID with meals (Patient taking differently: Inject 4-8 Units into the skin 3 (three) times daily with meals. ), Disp: 3 mL, Rfl: 0 .  Insulin Pen Needle 32G X 4 MM MISC, Use to inject insulin three times daily, Disp: 300 each, Rfl: 3 .  lipase/protease/amylase ([name]) 36000 UNITS CPEP capsule, Take 72,000 Units by mouth 3 (three) times daily before meals. 2 capsules TID w/ meals and 1 w/ snack, Disp: , Rfl:  .  lisinopril (ZESTRIL) 20 MG tablet, TAKE 1 TABLET BY MOUTH IN THE MORNING AND 2 AT BEDTIME (Patient taking differently: Take 20 mg by mouth 2 (two) times daily. ), Disp: 270 tablet, Rfl: 1 .  magnesium oxide (MAG-OX) 400 (241.3 Mg) MG tablet, Take 1 tablet by mouth once daily,  Disp: 90 tablet, Rfl: 0 .  metFORMIN (GLUCOPHAGE-XR) 500 MG 24 hr tablet, Take 2 tablets by mouth twice daily (Patient taking differently: Take 500 mg by mouth 2 (two) times daily. ), Disp: 360 tablet, Rfl: 0 .  ONETOUCH DELICA LANCETS 76A MISC, , Disp: , Rfl: 99 .  ONETOUCH VERIO test strip, USE 1 STRIP TO CHECK GLUCOSE TWICE DAILY, Disp: 200 each, Rfl: 0 .  pantoprazole (PROTONIX) 40 MG tablet, TAKE 1 TABLET BY MOUTH TWICE DAILY BEFORE MEAL(S) (Patient taking differently: 20 mg 2 (two) times daily. ), Disp: 180 tablet, Rfl: 2 .  pioglitazone (ACTOS) 15 MG tablet, Take 1 tablet by mouth once daily, Disp: 90 tablet, Rfl: 0 .  simvastatin (ZOCOR) 10 MG tablet, TAKE 1 TABLET BY  MOUTH AT BEDTIME, Disp: 90 tablet, Rfl: 0 .  triamcinolone cream (KENALOG) 0.1 %, APPLY  CREAM TOPICALLY TWICE DAILY, Disp: 15 g, Rfl: 0  EXAM:  VITALS per patient if applicable:  935/52, 63  GENERAL: alert, oriented, no acute distress  HEENT: atraumatic, conjunttiva clear, no obvious abnormalities on inspection of external nose and ears  NECK: normal movements of the head and neck  LUNGS: on inspection no signs of respiratory distress, breathing rate appears normal, no obvious gross SOB, gasping or wheezing  CV: no obvious cyanosis  PSYCH/NEURO: pleasant and cooperative, no obvious depression or anxiety, speech and thought processing grossly intact  ASSESSMENT AND PLAN:  Discussed the following assessment and plan:  New onset atrial fibrillation (HCC) Recently admitted and found to have afib with RVR.  Initially placed on IV cardizem.  Changed to oral.  Due to bradycardia, oral cardizem stopped and pt discharged to continue coreg.  No palpitations since discharge.  Plans for f/u with Dr Saralyn Pilar.    Hypertension Blood pressure as outlined.  Continue current medication regimen.  Follow pressures.  Follow metabolic panel.    IBS (irritable bowel syndrome) Bowels stable.    Hyperlipidemia On simvastatin.   Follow lipid panel and liver function tests.    Diabetes mellitus Sugars doing ok.  No low sugars currently.  Follow.    COVID-19 virus infection Recently admitted with afib with RVR.  Tested positive for covid.  Received remdesivir and decadron.  Will complete decadron at home.  Some scratchy throat and some cough.  Will continue steroids.  Treat symptoms - saline, robitussin if needed.  Continue inhalers.  Rest.  Fluids.  Follow closely.      I discussed the assessment and treatment plan with the patient. The patient was provided an opportunity to ask questions and all were answered. The patient agreed with the plan and demonstrated an understanding of the instructions.   The patient was advised to call back or seek an in-person evaluation if the symptoms worsen or if the condition fails to improve as anticipated.   Einar Pheasant, MD

## 2019-03-26 NOTE — Telephone Encounter (Signed)
My chart message sent to pt for update.   

## 2019-03-29 ENCOUNTER — Ambulatory Visit (INDEPENDENT_AMBULATORY_CARE_PROVIDER_SITE_OTHER): Payer: Medicare HMO | Admitting: Pharmacist

## 2019-03-29 ENCOUNTER — Other Ambulatory Visit: Payer: Self-pay | Admitting: *Deleted

## 2019-03-29 DIAGNOSIS — E139 Other specified diabetes mellitus without complications: Secondary | ICD-10-CM

## 2019-03-29 DIAGNOSIS — N183 Chronic kidney disease, stage 3 unspecified: Secondary | ICD-10-CM

## 2019-03-29 DIAGNOSIS — E1140 Type 2 diabetes mellitus with diabetic neuropathy, unspecified: Secondary | ICD-10-CM

## 2019-03-29 DIAGNOSIS — Z794 Long term (current) use of insulin: Secondary | ICD-10-CM

## 2019-03-29 DIAGNOSIS — I10 Essential (primary) hypertension: Secondary | ICD-10-CM | POA: Diagnosis not present

## 2019-03-29 NOTE — Progress Notes (Signed)
Reviewed information.  Agree with plan.    Dr Demetrice Amstutz 

## 2019-03-29 NOTE — Chronic Care Management (AMB) (Signed)
Chronic Care Management   Follow Up Note   03/29/2019 Name: Beth Tyler MRN: [id number] DOB: 06-11-1945  Referred by: Einar Pheasant, MD Reason for referral : Chronic Care Management (Medication Management)   Beth Tyler is a 73 y.o. year old female who is a primary care patient of Einar Pheasant, MD. The CCM team was consulted for assistance with chronic disease management and care coordination needs.    Received message from Landis Martins, RN, that patient noted concerns about Eliquis cost.   Review of patient status, including review of consultants reports, relevant laboratory and other test results, and collaboration with appropriate care team members and the patient's provider was performed as part of comprehensive patient evaluation and provision of chronic care management services.    SDOH (Social Determinants of Health) screening performed today: Financial Strain  Depression   Social Connections Stress. See Care Plan for related entries.   Outpatient Encounter Medications as of 03/29/2019  Medication Sig Note  . apixaban (ELIQUIS) 5 MG TABS tablet Take 1 tablet (5 mg total) by mouth 2 (two) times daily.   . fluticasone furoate-vilanterol (BREO ELLIPTA) 200-25 MCG/INH AEPB Inhale 1 puff into the lungs daily.   . Fluticasone-Salmeterol (ADVAIR DISKUS) 250-50 MCG/DOSE AEPB Inhale 1 puff into the lungs 2 (two) times daily.   . hydrochlorothiazide (HYDRODIURIL) 25 MG tablet Take 1 tablet (25 mg total) by mouth daily. 12/17/2018: AM  . Insulin Glargine (BASAGLAR KWIKPEN) 100 UNIT/ML SOPN Inject 0.3 mLs (30 Units total) into the skin daily. (Patient taking differently: Inject 28 Units into the skin every morning. )   . metFORMIN (GLUCOPHAGE-XR) 500 MG 24 hr tablet Take 2 tablets by mouth twice daily (Patient taking differently: Take 500 mg by mouth 2 (two) times daily. )   . acetaminophen (TYLENOL) 500 MG tablet Take 500 mg by mouth every 6 (six) hours as needed.   Marland Kitchen amLODipine  (NORVASC) 10 MG tablet Take 1 tablet by mouth once daily   . Blood Glucose Monitoring Suppl (ONETOUCH VERIO FLEX SYSTEM) w/Device KIT    . carvedilol (COREG) 25 MG tablet TAKE 1 TABLET BY MOUTH TWICE DAILY WITH A MEAL (Patient taking differently: Take 12.5 mg by mouth 2 (two) times daily with a meal. )   . fluticasone (CUTIVATE) 0.05 % cream USE AS DIRECTED (Patient taking differently: Apply 1 application topically as needed. Use as directed.)   . insulin lispro ([name]) 100 UNIT/ML injection Inject up to 10 units TID with meals (Patient taking differently: Inject 4-8 Units into the skin 3 (three) times daily with meals. )   . Insulin Pen Needle 32G X 4 MM MISC Use to inject insulin three times daily   . lipase/protease/amylase ([name]) 36000 UNITS CPEP capsule Take 72,000 Units by mouth 3 (three) times daily before meals. 2 capsules TID w/ meals and 1 w/ snack   . lisinopril (ZESTRIL) 20 MG tablet TAKE 1 TABLET BY MOUTH IN THE MORNING AND 2 AT BEDTIME (Patient taking differently: Take 20 mg by mouth 2 (two) times daily. )   . magnesium oxide (MAG-OX) 400 (241.3 Mg) MG tablet Take 1 tablet by mouth once daily   . ONETOUCH DELICA LANCETS 08M MISC    . ONETOUCH VERIO test strip USE 1 STRIP TO CHECK GLUCOSE TWICE DAILY   . pantoprazole (PROTONIX) 40 MG tablet TAKE 1 TABLET BY MOUTH TWICE DAILY BEFORE MEAL(S) (Patient taking differently: 20 mg 2 (two) times daily. )   . pioglitazone (ACTOS) 15 MG tablet  Take 1 tablet by mouth once daily   . simvastatin (ZOCOR) 10 MG tablet TAKE 1 TABLET BY MOUTH AT BEDTIME   . triamcinolone cream (KENALOG) 0.1 % APPLY  CREAM TOPICALLY TWICE DAILY    No facility-administered encounter medications on file as of 03/29/2019.     Goals Addressed            This Visit's Progress     Patient Stated   . "I can't afford my medications" (pt-stated)       Current Barriers:  . Diabetes: uncontrolled though improved per BG; most recent A1c 7.8%, due, but has labwork and  appointment scheduled w/ PCP in December  o Recent hospital admission for new onset Afib and COVID. Notes today that she has "never felt as bad as I have this week" . Current antihyperglycemic regimen: Pioglitazone 15 mg daily, Basaglar 28 units daily, [name] 4 with breakfast sometimes; 6 units with lunch, 8 units with supper  o GLP1 not a option d/t significant IBS. Renal function low for SGLT2 to provide glycemic benefit . Cardiovascular risk reduction: o Current hypertensive regimen: amlodipine 10 mg QPM, carvedilol 25 mg BID, HCTZ 25 mg QAM, lisinopril 20 mg BID;  o Current hyperlipidemia regimen: simvastatin 10 mg daily; LDL at goal <70 on last check . Atrial Fibrillation: Eliquis 5 mg BID; received free 30 day supply copay card, and is concerned about the expense moving forward if she is to remain on this therapy chronically. Notes that she has follow up with cardiology on 12/29  Pharmacist Clinical Goal(s):  Marland Kitchen Over the next 90 days, patient with work with PharmD and primary care provider to address optimized glycemic management  Interventions: . Provided empathetic listening as patient discussed her recent illness, and loneliness w/ quarantine . Will wait until after appointment w/ Dr. Saralyn Pilar to see if patient will remain on Eliquis therapy. Unfortunately, she will not qualify for Eliquis assistance d/t the out of pocket spend requirement. May consider applying for Tier Exception, if therapy continues . Moving forward, will plan to discontinue pioglitazone d/t possible CV/HF risk, now that patient has an increased risk d/t Afib . Patient is due to reapply for patient assistance from [employer] for [employer] and [name]. Will collaborate w/ Susy Frizzle, CPhT to mail patient her portion of the application for assistance. Will collaborate w/ Dr. Nicki Reaper for provider signature. Once all parts received, will collaborate w/ Susy Frizzle, CPhT to submit and follow up.   Patient Self Care  Activities:  . Patient will check blood glucose QID, document, and provide at future appointments . Patient will take medications as prescribed . Patient will report any questions or concerns to provider   Please see past updates related to this goal by clicking on the "Past Updates" button in the selected goal          Plan: - Will collaborate w/ interdisciplinary team on medication access as above - Scheduled outreach with patient on 05/02/2018 at 11:30 am   Catie Darnelle Maffucci, PharmD, [place], [place] Pharmacist [place] [phone]

## 2019-03-29 NOTE — Patient Outreach (Signed)
[facility]) Care Management  03/29/2019  Beth Tyler 16-May-1945 [id number]   Red on EMMI Alert : General discharge  Day:#4 Date : 03/27/19 Red Alert Reason : Question lost interest in things : Yes  Hospital Admission : 12/10-12/14, New onset Atrial Fib, Positive Covid 19.  Transition of care by PCP office , Dr. Lorin Picket.   EMMI Red Alert  Successful outreach call to patient, explained reason for the call and emmi calls . HIPAA verified x 2 identifiers. Patient recalls receiving call and response to question, she discussed just not feeling up to par, drained and wanting to feel better , to feel like doing something. She discussed feeling foggy mentally.  She discussed some loneliness related to quarantining in home due covid 19  She denies depression, emotional support.  Depression screen [facility] 2/9 03/29/2019 01/27/2019 12/09/2017 12/17/2016 04/05/2016  Decreased Interest 0 0 0 0 0  Down, Depressed, Hopeless 0 0 0 0 0  PHQ - 2 Score 0 0 0 0 0  Altered sleeping - - - 0 -  Tired, decreased energy - - - 0 -  Change in appetite - - - 0 -  Feeling bad or failure about yourself  - - - 0 -  Trouble concentrating - - - 0 -  Moving slowly or fidgety/restless - - - 0 -  Suicidal thoughts - - - 0 -  PHQ-9 Score - - - 0 -  Some recent data might be hidden   Social  Patient lives at home with her son and daughter in Social worker . She reports being more independent prior to covid restrictions. She has been quarantining in home due daughter in law recently diagnosed with Covid 19. Patient son is able to assist with meals.  She is independent with ADl"s. She discussed that have been adhering to quarantining  in home, being careful, wearing mask and cleaning as recommended.   She was driving locally and to PCP appointments, she does not drive past [place].  She has  walker and cane for use in home, reports her bathroom equipped with grab bar, raised commode and grab bars. 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 She voiced concern regarding referral plan to Atrial fib clinic in [place] as she doesn't drive that far and unsure if her son will be able to provide transportation , she plans to discuss with Dr. Darrold Junker on next week visit.   Medical Conditions  New Atrial fib- she reported having palpitations called EMS found to be in Atrial fib on admission .  She was also found to be positive for Covid 19, she denies having fever , cough , oxygen saturation  97%. She reports just losing her taste and smell on yesterday. Other medical conditions include : Type 2 Diabetes, A1c 7.8% on 03/12/19, Diabetes - pancreatic exocrine dysfunction syndrome  Hypertension, Interstitial lung disease , Chronic kidney disease, stage 3, Hyperlipidema Patient discussed working toward managing chronic conditions weight loss of 30 lbs over the last year, she monitors blood pressure daily , 129/61 today.   Medications  Patient reports concern over cost to Eliquis that she was started on at discharge and received a copay card . She reports that she has contacted her pharmacy and cost of prescription will be $300. She discussed previous assist with medication cost due to insulin and creon by [facility] pharmacist Catie Greggory Keen, Pharmacist Tech.   Appointments  She has telethealth visit PCP on  12/22 Dr. Saralyn Pilar on 12/29 , Cardiology  Referral to Atrial Fib Clinic pending .  Advanced Directive  Patient reports that she has paperwork hasn't completed .   Explained [facility] care management program, patient is agreeable to services, telephonic outreach from Care management for education and support.    Provided patient with [facility] care management main office contact number , may contact number if new concerns prior to outreach.   Plan  Secure message to Catie Darnelle Maffucci , Pharmacist , regarding patient concern related to cost of Eliquis.  Will place referral to [facility] care  management program, new atrial fib diagnosis education and support.    Joylene Draft, RN, [place] Management Coordinator  [phone]- Mobile [phone]- Toll Free Main Office

## 2019-03-29 NOTE — Patient Instructions (Signed)
Visit Information  Goals Addressed            This Visit's Progress     Patient Stated   . "I can't afford my medications" (pt-stated)       Current Barriers:  . Diabetes: uncontrolled though improved per BG; most recent A1c 7.8%, due, but has labwork and appointment scheduled w/ PCP in December  o Recent hospital admission for new onset Afib and COVID. Notes today that she has "never felt as bad as I have this week" . Current antihyperglycemic regimen: Pioglitazone 15 mg daily, Basaglar 28 units daily, [name] 4 with breakfast sometimes; 6 units with lunch, 8 units with supper  o GLP1 not a option d/t significant IBS. Renal function low for SGLT2 to provide glycemic benefit . Cardiovascular risk reduction: o Current hypertensive regimen: amlodipine 10 mg QPM, carvedilol 25 mg BID, HCTZ 25 mg QAM, lisinopril 20 mg BID;  o Current hyperlipidemia regimen: simvastatin 10 mg daily; LDL at goal <70 on last check . Atrial Fibrillation: Eliquis 5 mg BID; received free 30 day supply copay card, and is concerned about the expense moving forward if she is to remain on this therapy chronically. Notes that she has follow up with cardiology on 12/29  Pharmacist Clinical Goal(s):  Marland Kitchen Over the next 90 days, patient with work with PharmD and primary care provider to address optimized glycemic management  Interventions: . Provided empathetic listening as patient discussed her recent illness, and loneliness w/ quarantine . Will wait until after appointment w/ Dr. Saralyn Pilar to see if patient will remain on Eliquis therapy. Unfortunately, she will not qualify for Eliquis assistance d/t the out of pocket spend requirement. May consider applying for Tier Exception, if therapy continues . Moving forward, will plan to discontinue pioglitazone d/t possible CV/HF risk, now that patient has an increased risk d/t Afib . Patient is due to reapply for patient assistance from [employer] for [employer] and [name]. Will  collaborate w/ Susy Frizzle, CPhT to mail patient her portion of the application for assistance. Will collaborate w/ Dr. Nicki Reaper for provider signature. Once all parts received, will collaborate w/ Susy Frizzle, CPhT to submit and follow up.   Patient Self Care Activities:  . Patient will check blood glucose QID, document, and provide at future appointments . Patient will take medications as prescribed . Patient will report any questions or concerns to provider   Please see past updates related to this goal by clicking on the "Past Updates" button in the selected goal         The patient verbalized understanding of instructions provided today and declined a print copy of patient instruction materials.   Plan: - Will collaborate w/ interdisciplinary team on medication access as above - Scheduled outreach with patient on 05/02/2018 at 11:30 am   Catie Darnelle Maffucci, PharmD, [place], [place] Pharmacist [place] [phone]

## 2019-03-30 ENCOUNTER — Other Ambulatory Visit: Payer: Self-pay | Admitting: *Deleted

## 2019-03-30 ENCOUNTER — Other Ambulatory Visit: Payer: Self-pay

## 2019-03-30 ENCOUNTER — Encounter: Payer: Self-pay | Admitting: Internal Medicine

## 2019-03-30 ENCOUNTER — Ambulatory Visit (INDEPENDENT_AMBULATORY_CARE_PROVIDER_SITE_OTHER): Payer: Medicare HMO | Admitting: Internal Medicine

## 2019-03-30 DIAGNOSIS — U071 COVID-19: Secondary | ICD-10-CM | POA: Diagnosis not present

## 2019-03-30 DIAGNOSIS — I10 Essential (primary) hypertension: Secondary | ICD-10-CM

## 2019-03-30 DIAGNOSIS — E538 Deficiency of other specified B group vitamins: Secondary | ICD-10-CM | POA: Diagnosis not present

## 2019-03-30 DIAGNOSIS — N1831 Chronic kidney disease, stage 3a: Secondary | ICD-10-CM | POA: Diagnosis not present

## 2019-03-30 DIAGNOSIS — I4891 Unspecified atrial fibrillation: Secondary | ICD-10-CM | POA: Diagnosis not present

## 2019-03-30 DIAGNOSIS — I700 Atherosclerosis of aorta: Secondary | ICD-10-CM

## 2019-03-30 DIAGNOSIS — Z794 Long term (current) use of insulin: Secondary | ICD-10-CM

## 2019-03-30 DIAGNOSIS — E1140 Type 2 diabetes mellitus with diabetic neuropathy, unspecified: Secondary | ICD-10-CM

## 2019-03-30 DIAGNOSIS — K589 Irritable bowel syndrome without diarrhea: Secondary | ICD-10-CM | POA: Diagnosis not present

## 2019-03-30 NOTE — Progress Notes (Signed)
Patient ID: Beth Tyler, female   DOB: March 13, 1946, 73 y.o.   MRN: [id number]   Virtual Visit via video Note  This visit type was conducted due to national recommendations for restrictions regarding the COVID-19 pandemic (e.g. social distancing).  This format is felt to be most appropriate for this patient at this time.  All issues noted in this document were discussed and addressed.  No physical exam was performed (except for noted visual exam findings with Video Visits).   I connected with [name] by a video enabled telemedicine application and verified that I am speaking with the correct person using two identifiers. Location patient: home Location provider: work Persons participating in the virtual visit: patient, provider  The limitations, risks, security and privacy concerns of performing an evaluation and management service and the availability of in person appointments have been discussed. The patient expressed understanding and agreed to proceed.   Reason for visit: follow up appt  HPI: Was admitted 03/18/19 - 03/22/19 with palpitations and found to be in afib with RVR.  Placed on cardizem initially.  Converted to SR.  Discharged only on coreg secondary to bradycardia.  On eliquis.  No reports of increased heart rate or palpitations.  While in hospital, she was diagnosed with covid.  Given remdesivir and decadron.  Discharged home to complete her quarantine and prednisone.  No fever. She is eating.  Clearing throat.  No sob.  No headache yesterday or today.  Occasional dry cough.  No vomiting.  Still fatigued.  Continuing to quarantine.  Completed steroids yesterday.  Due to f/u with cardiology 04/06/19.  Pulse 97%   ROS: See pertinent positives and negatives per HPI.  Past Medical History:  Diagnosis Date  . Anemia   . Arthritis   . B12 deficiency   . Carpal tunnel syndrome, bilateral   . Diabetes mellitus ([place]) 02/17/2012  . Diabetes mellitus, type II ([place])   .  Dyspnea   . Gastritis   . GERD (gastroesophageal reflux disease)   . Hypercholesterolemia   . Hypertension   . IBS (irritable bowel syndrome)   . Neuropathy    legs  . Pernicious anemia   . Vertigo    no episodes fo 9 or 10 yrs    Past Surgical History:  Procedure Laterality Date  . ANTERIOR VITRECTOMY Right 12/01/2017   Procedure: ANTERIOR VITRECTOMY;  Surgeon: Eulogio Bear, MD;  Location: [place];  Service: Ophthalmology;  Laterality: Right;  . BREAST BIOPSY Right 1980   neg  . BREAST SURGERY  1980   benign cyst removal  . CATARACT EXTRACTION W/PHACO Left 10/13/2017   Procedure: CATARACT EXTRACTION PHACO AND INTRAOCULAR LENS PLACEMENT ([place])  LEFT  DIABETES;  Surgeon: Eulogio Bear, MD;  Location: [place];  Service: Ophthalmology;  Laterality: Left;  Diabetic - insulin and oral meds  . CATARACT EXTRACTION W/PHACO Right 12/01/2017   Procedure: CATARACT EXTRACTION PHACO AND INTRAOCULAR LENS PLACEMENT ([place])  RIGHT DIABETIC;  Surgeon: Eulogio Bear, MD;  Location: [place];  Service: Ophthalmology;  Laterality: Right;  Diabetic - insulin and oral  . CHOLECYSTECTOMY  1983    Family History  Problem Relation Age of Onset  . Diabetes Father   . Kidney disease Father   . Heart disease Father   . Alcohol abuse Mother   . Dementia Mother   . Diabetes Mother   . Diabetes Other   . Heart disease Son 63  CABG  . Hemophilia Brother   . Uterine cancer Sister   . Diabetes Sister   . COPD Sister   . Breast cancer Maternal Aunt 51  . Breast cancer Daughter 26  . Stroke Daughter     SOCIAL HX: reviewed.   No current facility-administered medications for this visit.  Current Outpatient Medications:  .  acetaminophen (TYLENOL) 500 MG tablet, Take 500 mg by mouth every 6 (six) hours as needed., Disp: , Rfl:  .  amLODipine (NORVASC) 10 MG tablet, Take 1 tablet by mouth once daily, Disp: 90 tablet, Rfl: 0 .  apixaban (ELIQUIS) 5 MG  TABS tablet, Take 1 tablet (5 mg total) by mouth 2 (two) times daily., Disp: 60 tablet, Rfl: 1 .  Blood Glucose Monitoring Suppl (ONETOUCH VERIO FLEX SYSTEM) w/Device KIT, , Disp: , Rfl: 0 .  carvedilol (COREG) 25 MG tablet, TAKE 1 TABLET BY MOUTH TWICE DAILY WITH A MEAL (Patient taking differently: Take 12.5 mg by mouth 2 (two) times daily with a meal. ), Disp: 180 tablet, Rfl: 0 .  fluticasone (CUTIVATE) 0.05 % cream, USE AS DIRECTED (Patient taking differently: Apply 1 application topically as needed. Use as directed.), Disp: 30 g, Rfl: 0 .  fluticasone furoate-vilanterol (BREO ELLIPTA) 200-25 MCG/INH AEPB, Inhale 1 puff into the lungs daily., Disp: 28 each, Rfl: 0 .  Fluticasone-Salmeterol (ADVAIR DISKUS) 250-50 MCG/DOSE AEPB, Inhale 1 puff into the lungs 2 (two) times daily., Disp: 60 each, Rfl: 3 .  hydrochlorothiazide (HYDRODIURIL) 25 MG tablet, Take 1 tablet (25 mg total) by mouth daily., Disp: 90 tablet, Rfl: 1 .  Insulin Glargine (BASAGLAR KWIKPEN) 100 UNIT/ML SOPN, Inject 0.3 mLs (30 Units total) into the skin daily. (Patient taking differently: Inject 28 Units into the skin every morning. ), Disp: 4 pen, Rfl: 1 .  insulin lispro ([name]) 100 UNIT/ML injection, Inject up to 10 units TID with meals (Patient taking differently: Inject 4-8 Units into the skin 3 (three) times daily with meals. ), Disp: 3 mL, Rfl: 0 .  Insulin Pen Needle 32G X 4 MM MISC, Use to inject insulin three times daily, Disp: 300 each, Rfl: 3 .  lipase/protease/amylase ([name]) 36000 UNITS CPEP capsule, Take 72,000 Units by mouth 3 (three) times daily before meals. 2 capsules TID w/ meals and 1 w/ snack, Disp: , Rfl:  .  lisinopril (ZESTRIL) 20 MG tablet, TAKE 1 TABLET BY MOUTH IN THE MORNING AND 2 AT BEDTIME (Patient taking differently: Take 20 mg by mouth 2 (two) times daily. ), Disp: 270 tablet, Rfl: 1 .  magnesium oxide (MAG-OX) 400 (241.3 Mg) MG tablet, Take 1 tablet by mouth once daily, Disp: 90 tablet, Rfl: 0 .   metFORMIN (GLUCOPHAGE-XR) 500 MG 24 hr tablet, Take 2 tablets by mouth twice daily (Patient taking differently: Take 500 mg by mouth 2 (two) times daily. ), Disp: 360 tablet, Rfl: 0 .  ONETOUCH DELICA LANCETS 24M MISC, , Disp: , Rfl: 99 .  ONETOUCH VERIO test strip, USE 1 STRIP TO CHECK GLUCOSE TWICE DAILY, Disp: 200 each, Rfl: 0 .  pantoprazole (PROTONIX) 40 MG tablet, TAKE 1 TABLET BY MOUTH TWICE DAILY BEFORE MEAL(S) (Patient taking differently: 20 mg 2 (two) times daily. ), Disp: 180 tablet, Rfl: 2 .  pioglitazone (ACTOS) 15 MG tablet, Take 1 tablet by mouth once daily, Disp: 90 tablet, Rfl: 0 .  simvastatin (ZOCOR) 10 MG tablet, TAKE 1 TABLET BY MOUTH AT BEDTIME, Disp: 90 tablet, Rfl: 0 .  triamcinolone cream (KENALOG)  0.1 %, APPLY  CREAM TOPICALLY TWICE DAILY, Disp: 15 g, Rfl: 0  Facility-Administered Medications Ordered in Other Visits:  .  sodium chloride 0.9 % bolus 500 mL, 500 mL, Intravenous, Once, Monks, Damaris Hippo., MD  EXAM:  VITALS per patient if applicable:  449/67, 69, 97%  GENERAL: alert, oriented, appears well and in no acute distress  HEENT: atraumatic, conjunttiva clear, no obvious abnormalities on inspection of external nose and ears  NECK: normal movements of the head and neck  LUNGS: on inspection no signs of respiratory distress, breathing rate appears normal, no obvious gross SOB, gasping or wheezing  CV: no obvious cyanosis  PSYCH/NEURO: pleasant and cooperative, no obvious depression or anxiety, speech and thought processing grossly intact  ASSESSMENT AND PLAN:  Discussed the following assessment and plan:  Aortic atherosclerosis ([place]) On simvastatin.   B12 deficiency Continue B12 injections.    CKD (chronic kidney disease), stage III Followed by nephrology.  Avoid antiinflammatories.  Follow metabolic panel.    Diabetes mellitus She denies any recent low blood sugars now.  She is eating.  Finished steroids yesterday.  Sugars doing better.  Follow met  b and a1c.   COVID-19 virus infection Recently admitted with afib with RVR.  Tested positive for covid.  Received remdesivir and decadron.  Finished decadron yesterday.  Denies sob.  Pulse ox 97% room air.  Treat symptoms.  Rest.  Fluids.  Continue to update [place] if any change or problems.    New onset atrial fibrillation ([place]) Recently admitted and found to have afib with RVR.  Discharged on coreg.  Has f/u planned with Dr Saralyn Pilar 04/06/19.  Currently feels hear stable.    IBS (irritable bowel syndrome) Has seen GI.  Stable.  Persistent issues.  [name].    Hypertension Blood pressure under reasonable control.  Follow.      I discussed the assessment and treatment plan with the patient. The patient was provided an opportunity to ask questions and all were answered. The patient agreed with the plan and demonstrated an understanding of the instructions.   The patient was advised to call back or seek an in-person evaluation if the symptoms worsen or if the condition fails to improve as anticipated.   Einar Pheasant, MD

## 2019-03-30 NOTE — Patient Outreach (Signed)
Referral received for complex case management after Red EMMI flag addressed and needs identified, outreach call to pt for initial telephonic assessment, no answer to telephone, left voicemail requesting return phone call,  RN CM mailed unsuccessful outreach letter to pt home.  PLAN Outreach pt in 3-4 business days  Jacqlyn Larsen [facility], [place] [phone]

## 2019-03-31 ENCOUNTER — Ambulatory Visit: Payer: Medicare HMO

## 2019-04-03 ENCOUNTER — Encounter: Payer: Self-pay | Admitting: Internal Medicine

## 2019-04-03 DIAGNOSIS — U071 COVID-19: Secondary | ICD-10-CM | POA: Insufficient documentation

## 2019-04-03 NOTE — Assessment & Plan Note (Signed)
Recently admitted with afib with RVR.  Tested positive for covid.  Received remdesivir and decadron.  Will complete decadron at home.  Some scratchy throat and some cough.  Will continue steroids.  Treat symptoms - saline, robitussin if needed.  Continue inhalers.  Rest.  Fluids.  Follow closely.

## 2019-04-03 NOTE — Assessment & Plan Note (Signed)
Bowels stable.  

## 2019-04-03 NOTE — Assessment & Plan Note (Signed)
Recently admitted and found to have afib with RVR.  Initially placed on IV cardizem.  Changed to oral.  Due to bradycardia, oral cardizem stopped and pt discharged to continue coreg.  No palpitations since discharge.  Plans for f/u with Dr Saralyn Pilar.

## 2019-04-03 NOTE — Assessment & Plan Note (Signed)
On simvastatin.  Follow lipid panel and liver function tests.   

## 2019-04-03 NOTE — Assessment & Plan Note (Signed)
Blood pressure as outlined.  Continue current medication regimen.  Follow pressures.  Follow metabolic panel.  

## 2019-04-03 NOTE — Assessment & Plan Note (Signed)
Sugars doing ok.  No low sugars currently.  Follow.

## 2019-04-04 ENCOUNTER — Observation Stay
Admission: EM | Admit: 2019-04-04 | Discharge: 2019-04-05 | Disposition: A | Discharge status: Home / Self Care | Payer: Medicare HMO | Attending: Hospitalist | Admitting: Hospitalist

## 2019-04-04 ENCOUNTER — Emergency Department: Payer: Medicare HMO

## 2019-04-04 ENCOUNTER — Other Ambulatory Visit: Payer: Self-pay

## 2019-04-04 ENCOUNTER — Observation Stay: Payer: Medicare HMO

## 2019-04-04 ENCOUNTER — Encounter: Payer: Self-pay | Admitting: Internal Medicine

## 2019-04-04 DIAGNOSIS — I890 Lymphedema, not elsewhere classified: Secondary | ICD-10-CM | POA: Insufficient documentation

## 2019-04-04 DIAGNOSIS — E1165 Type 2 diabetes mellitus with hyperglycemia: Secondary | ICD-10-CM | POA: Diagnosis not present

## 2019-04-04 DIAGNOSIS — I10 Essential (primary) hypertension: Secondary | ICD-10-CM | POA: Diagnosis not present

## 2019-04-04 DIAGNOSIS — Z6838 Body mass index (BMI) 38.0-38.9, adult: Secondary | ICD-10-CM | POA: Diagnosis not present

## 2019-04-04 DIAGNOSIS — N1831 Chronic kidney disease, stage 3a: Secondary | ICD-10-CM | POA: Diagnosis not present

## 2019-04-04 DIAGNOSIS — E1140 Type 2 diabetes mellitus with diabetic neuropathy, unspecified: Secondary | ICD-10-CM | POA: Diagnosis not present

## 2019-04-04 DIAGNOSIS — N183 Chronic kidney disease, stage 3 unspecified: Secondary | ICD-10-CM | POA: Diagnosis present

## 2019-04-04 DIAGNOSIS — E1122 Type 2 diabetes mellitus with diabetic chronic kidney disease: Secondary | ICD-10-CM | POA: Diagnosis not present

## 2019-04-04 DIAGNOSIS — R001 Bradycardia, unspecified: Secondary | ICD-10-CM | POA: Diagnosis not present

## 2019-04-04 DIAGNOSIS — M1990 Unspecified osteoarthritis, unspecified site: Secondary | ICD-10-CM | POA: Insufficient documentation

## 2019-04-04 DIAGNOSIS — E669 Obesity, unspecified: Secondary | ICD-10-CM | POA: Insufficient documentation

## 2019-04-04 DIAGNOSIS — R4020 Unspecified coma: Secondary | ICD-10-CM | POA: Diagnosis not present

## 2019-04-04 DIAGNOSIS — Z79899 Other long term (current) drug therapy: Secondary | ICD-10-CM | POA: Insufficient documentation

## 2019-04-04 DIAGNOSIS — Z7901 Long term (current) use of anticoagulants: Secondary | ICD-10-CM | POA: Diagnosis not present

## 2019-04-04 DIAGNOSIS — Z8619 Personal history of other infectious and parasitic diseases: Secondary | ICD-10-CM | POA: Diagnosis not present

## 2019-04-04 DIAGNOSIS — E860 Dehydration: Secondary | ICD-10-CM | POA: Diagnosis not present

## 2019-04-04 DIAGNOSIS — Z794 Long term (current) use of insulin: Secondary | ICD-10-CM | POA: Diagnosis not present

## 2019-04-04 DIAGNOSIS — E871 Hypo-osmolality and hyponatremia: Secondary | ICD-10-CM

## 2019-04-04 DIAGNOSIS — Z885 Allergy status to narcotic agent status: Secondary | ICD-10-CM | POA: Insufficient documentation

## 2019-04-04 DIAGNOSIS — E7800 Pure hypercholesterolemia, unspecified: Secondary | ICD-10-CM | POA: Diagnosis not present

## 2019-04-04 DIAGNOSIS — R4182 Altered mental status, unspecified: Secondary | ICD-10-CM | POA: Diagnosis not present

## 2019-04-04 DIAGNOSIS — Z7951 Long term (current) use of inhaled steroids: Secondary | ICD-10-CM | POA: Diagnosis not present

## 2019-04-04 DIAGNOSIS — J849 Interstitial pulmonary disease, unspecified: Secondary | ICD-10-CM | POA: Diagnosis not present

## 2019-04-04 DIAGNOSIS — I129 Hypertensive chronic kidney disease with stage 1 through stage 4 chronic kidney disease, or unspecified chronic kidney disease: Secondary | ICD-10-CM | POA: Insufficient documentation

## 2019-04-04 DIAGNOSIS — E785 Hyperlipidemia, unspecified: Secondary | ICD-10-CM

## 2019-04-04 DIAGNOSIS — Z8719 Personal history of other diseases of the digestive system: Secondary | ICD-10-CM | POA: Insufficient documentation

## 2019-04-04 DIAGNOSIS — K859 Acute pancreatitis without necrosis or infection, unspecified: Secondary | ICD-10-CM | POA: Diagnosis not present

## 2019-04-04 DIAGNOSIS — R531 Weakness: Secondary | ICD-10-CM | POA: Diagnosis not present

## 2019-04-04 DIAGNOSIS — I700 Atherosclerosis of aorta: Secondary | ICD-10-CM | POA: Insufficient documentation

## 2019-04-04 DIAGNOSIS — K219 Gastro-esophageal reflux disease without esophagitis: Secondary | ICD-10-CM | POA: Diagnosis not present

## 2019-04-04 DIAGNOSIS — K580 Irritable bowel syndrome with diarrhea: Secondary | ICD-10-CM | POA: Insufficient documentation

## 2019-04-04 DIAGNOSIS — Z833 Family history of diabetes mellitus: Secondary | ICD-10-CM | POA: Insufficient documentation

## 2019-04-04 DIAGNOSIS — I480 Paroxysmal atrial fibrillation: Secondary | ICD-10-CM | POA: Diagnosis not present

## 2019-04-04 DIAGNOSIS — K861 Other chronic pancreatitis: Secondary | ICD-10-CM | POA: Diagnosis not present

## 2019-04-04 DIAGNOSIS — E119 Type 2 diabetes mellitus without complications: Secondary | ICD-10-CM

## 2019-04-04 DIAGNOSIS — Z8249 Family history of ischemic heart disease and other diseases of the circulatory system: Secondary | ICD-10-CM | POA: Insufficient documentation

## 2019-04-04 DIAGNOSIS — R404 Transient alteration of awareness: Secondary | ICD-10-CM | POA: Diagnosis not present

## 2019-04-04 DIAGNOSIS — U071 COVID-19: Secondary | ICD-10-CM | POA: Diagnosis not present

## 2019-04-04 DIAGNOSIS — Z880 Allergy status to penicillin: Secondary | ICD-10-CM | POA: Insufficient documentation

## 2019-04-04 DIAGNOSIS — R55 Syncope and collapse: Secondary | ICD-10-CM

## 2019-04-04 DIAGNOSIS — R410 Disorientation, unspecified: Secondary | ICD-10-CM | POA: Diagnosis not present

## 2019-04-04 DIAGNOSIS — J449 Chronic obstructive pulmonary disease, unspecified: Secondary | ICD-10-CM | POA: Diagnosis not present

## 2019-04-04 DIAGNOSIS — E878 Other disorders of electrolyte and fluid balance, not elsewhere classified: Secondary | ICD-10-CM

## 2019-04-04 LAB — CBC WITH DIFFERENTIAL/PLATELET
Abs Immature Granulocytes: 0.06 10*3/uL (ref 0.00–0.07)
Basophils Absolute: 0 10*3/uL (ref 0.0–0.1)
Basophils Relative: 0 %
Eosinophils Absolute: 0.2 10*3/uL (ref 0.0–0.5)
Eosinophils Relative: 3 %
HCT: 33 % — ABNORMAL LOW (ref 36.0–46.0)
Hemoglobin: 11.5 g/dL — ABNORMAL LOW (ref 12.0–15.0)
Immature Granulocytes: 1 %
Lymphocytes Relative: 18 %
Lymphs Abs: 1.3 10*3/uL (ref 0.7–4.0)
MCH: 28.8 pg (ref 26.0–34.0)
MCHC: 34.8 g/dL (ref 30.0–36.0)
MCV: 82.7 fL (ref 80.0–100.0)
Monocytes Absolute: 0.7 10*3/uL (ref 0.1–1.0)
Monocytes Relative: 9 %
Neutro Abs: 5.1 10*3/uL (ref 1.7–7.7)
Neutrophils Relative %: 69 %
Platelets: 204 10*3/uL (ref 150–400)
RBC: 3.99 MIL/uL (ref 3.87–5.11)
RDW: 13 % (ref 11.5–15.5)
WBC: 7.5 10*3/uL (ref 4.0–10.5)
nRBC: 0 % (ref 0.0–0.2)

## 2019-04-04 LAB — COMPREHENSIVE METABOLIC PANEL
ALT: 18 U/L (ref 0–44)
AST: 18 U/L (ref 15–41)
Albumin: 3.2 g/dL — ABNORMAL LOW (ref 3.5–5.0)
Alkaline Phosphatase: 61 U/L (ref 38–126)
Anion gap: 9 (ref 5–15)
BUN: 15 mg/dL (ref 8–23)
CO2: 25 mmol/L (ref 22–32)
Calcium: 8.3 mg/dL — ABNORMAL LOW (ref 8.9–10.3)
Chloride: 91 mmol/L — ABNORMAL LOW (ref 98–111)
Creatinine, Ser: 0.82 mg/dL (ref 0.44–1.00)
GFR calc Af Amer: >60 mL/min (ref >60)
GFR calc non Af Amer: >60 mL/min (ref >60)
Glucose, Bld: 101 mg/dL — ABNORMAL HIGH (ref 70–99)
Potassium: 4.6 mmol/L (ref 3.5–5.1)
Sodium: 125 mmol/L — ABNORMAL LOW (ref 135–145)
Total Bilirubin: 0.7 mg/dL (ref 0.3–1.2)
Total Protein: 6.7 g/dL (ref 6.5–8.1)

## 2019-04-04 LAB — URINE DRUG SCREEN, QUALITATIVE (ARMC ONLY)
Amphetamines, Ur Screen: NOT DETECTED
Barbiturates, Ur Screen: NOT DETECTED
Benzodiazepine, Ur Scrn: NOT DETECTED
Cannabinoid 50 Ng, Ur ~~LOC~~: NOT DETECTED
Cocaine Metabolite,Ur ~~LOC~~: NOT DETECTED
MDMA (Ecstasy)Ur Screen: NOT DETECTED
Methadone Scn, Ur: NOT DETECTED
Opiate, Ur Screen: NOT DETECTED
Phencyclidine (PCP) Ur S: NOT DETECTED
Tricyclic, Ur Screen: NOT DETECTED

## 2019-04-04 LAB — URINALYSIS, COMPLETE (UACMP) WITH MICROSCOPIC
Bacteria, UA: NONE SEEN
Bilirubin Urine: NEGATIVE
Glucose, UA: NEGATIVE mg/dL
Hgb urine dipstick: NEGATIVE
Ketones, ur: NEGATIVE mg/dL
Leukocytes,Ua: NEGATIVE
Nitrite: NEGATIVE
Protein, ur: NEGATIVE mg/dL
Specific Gravity, Urine: 1.004 — ABNORMAL LOW (ref 1.005–1.030)
Squamous Epithelial / HPF: NONE SEEN (ref 0–5)
pH: 6 (ref 5.0–8.0)

## 2019-04-04 LAB — LIPASE, BLOOD: Lipase: 27 U/L (ref 11–51)

## 2019-04-04 LAB — SODIUM, URINE, RANDOM: Sodium, Ur: 55 mmol/L

## 2019-04-04 LAB — T4, FREE: Free T4: 1.53 ng/dL — ABNORMAL HIGH (ref 0.61–1.12)

## 2019-04-04 LAB — MAGNESIUM: Magnesium: 1.7 mg/dL (ref 1.7–2.4)

## 2019-04-04 LAB — CREATININE, URINE, RANDOM: Creatinine, Urine: 23 mg/dL

## 2019-04-04 LAB — TSH: TSH: 1.985 u[IU]/mL (ref 0.350–4.500)

## 2019-04-04 LAB — TROPONIN I (HIGH SENSITIVITY): Troponin I (High Sensitivity): 8 ng/L (ref <18)

## 2019-04-04 LAB — PHOSPHORUS: Phosphorus: 3.5 mg/dL (ref 2.5–4.6)

## 2019-04-04 MED ORDER — MAGNESIUM SULFATE IN D5W 1-5 GM/100ML-% IV SOLN
1.0000 g | Freq: Once | INTRAVENOUS | Status: AC
  Administered 2019-04-04: 1 g via INTRAVENOUS
  Filled 2019-04-04: qty 100

## 2019-04-04 MED ORDER — SODIUM CHLORIDE 0.9 % IV SOLN: Freq: Once | INTRAVENOUS | Status: AC

## 2019-04-04 MED ORDER — ACETAMINOPHEN 325 MG PO TABS
650.0000 mg | ORAL_TABLET | Freq: Four times a day (QID) | ORAL | Status: DC | PRN
  Administered 2019-04-05: 650 mg via ORAL
  Filled 2019-04-04: qty 2

## 2019-04-04 MED ORDER — MOMETASONE FURO-FORMOTEROL FUM 200-5 MCG/ACT IN AERO
2.0000 | INHALATION_SPRAY | Freq: Two times a day (BID) | RESPIRATORY_TRACT | Status: DC
  Administered 2019-04-05: 2 via RESPIRATORY_TRACT
  Filled 2019-04-04: qty 8.8

## 2019-04-04 MED ORDER — SIMVASTATIN 10 MG PO TABS
10.0000 mg | ORAL_TABLET | Freq: Every day | ORAL | Status: DC
  Filled 2019-04-04: qty 1

## 2019-04-04 MED ORDER — FLUTICASONE FUROATE-VILANTEROL 200-25 MCG/INH IN AEPB
1.0000 | INHALATION_SPRAY | Freq: Every day | RESPIRATORY_TRACT | Status: DC
  Filled 2019-04-04 (×2): qty 28

## 2019-04-04 MED ORDER — PANCRELIPASE (LIP-PROT-AMYL) 12000-38000 UNITS PO CPEP
72000.0000 [IU] | ORAL_CAPSULE | Freq: Three times a day (TID) | ORAL | Status: DC
  Administered 2019-04-05: 72000 [IU] via ORAL
  Filled 2019-04-04 (×3): qty 6

## 2019-04-04 MED ORDER — INSULIN GLARGINE 100 UNIT/ML ~~LOC~~ SOLN
28.0000 [IU] | Freq: Every morning | SUBCUTANEOUS | Status: DC
  Filled 2019-04-04: qty 0.28

## 2019-04-04 MED ORDER — ACETAMINOPHEN 650 MG RE SUPP: 650.0000 mg | Freq: Four times a day (QID) | RECTAL | Status: DC | PRN

## 2019-04-04 MED ORDER — SODIUM CHLORIDE 0.9 % IV SOLN: INTRAVENOUS | Status: DC

## 2019-04-04 MED ORDER — SODIUM CHLORIDE 0.9 % IV BOLUS
500.0000 mL | Freq: Once | INTRAVENOUS | Status: AC
  Administered 2019-04-04: 500 mL via INTRAVENOUS

## 2019-04-04 MED ORDER — PANTOPRAZOLE SODIUM 20 MG PO TBEC
20.0000 mg | DELAYED_RELEASE_TABLET | Freq: Two times a day (BID) | ORAL | Status: DC
  Administered 2019-04-05: 20 mg via ORAL
  Filled 2019-04-04 (×2): qty 1

## 2019-04-04 MED ORDER — IOHEXOL 300 MG/ML  SOLN
100.0000 mL | Freq: Once | INTRAMUSCULAR | Status: AC | PRN
  Administered 2019-04-04: 100 mL via INTRAVENOUS
  Filled 2019-04-04: qty 100

## 2019-04-04 MED ORDER — ONDANSETRON HCL 4 MG/2ML IJ SOLN: 4.0000 mg | Freq: Four times a day (QID) | INTRAMUSCULAR | Status: DC | PRN

## 2019-04-04 MED ORDER — INSULIN ASPART 100 UNIT/ML ~~LOC~~ SOLN: 0.0000 [IU] | SUBCUTANEOUS | Status: DC

## 2019-04-04 MED ORDER — ONDANSETRON HCL 4 MG PO TABS: 4.0000 mg | ORAL_TABLET | Freq: Four times a day (QID) | ORAL | Status: DC | PRN

## 2019-04-04 MED ORDER — APIXABAN 5 MG PO TABS
5.0000 mg | ORAL_TABLET | Freq: Two times a day (BID) | ORAL | Status: DC
  Administered 2019-04-05: 5 mg via ORAL
  Filled 2019-04-04 (×4): qty 1

## 2019-04-04 NOTE — ED Notes (Signed)
MD at bedside. 

## 2019-04-04 NOTE — Assessment & Plan Note (Signed)
Recently admitted and found to have afib with RVR.  Discharged on coreg.  Has f/u planned with Dr Saralyn Pilar 04/06/19.  Currently feels hear stable.

## 2019-04-04 NOTE — Assessment & Plan Note (Signed)
Continue B12 injections.   

## 2019-04-04 NOTE — ED Notes (Signed)
Judeen Hammans, daughter of pt, updated on pt admission status.

## 2019-04-04 NOTE — ED Triage Notes (Addendum)
EMS states lethargic initially. 911 call was for being unresponsive. from home via [facility] bradycardic at 48 atropine 0.5mg  then HR 68 was never paced BP was in 80's bilat IV's 846ml NS EMS states eliquis sends pt into a fib but is currently taking a fib, has a fib since COVID but not before, has been taking carvidolol COVID + 12/10 CBG 141 (type 2 DM) A&O upon arrival to ED

## 2019-04-04 NOTE — ED Notes (Signed)
Admitting MD at bedside.

## 2019-04-04 NOTE — Assessment & Plan Note (Signed)
Blood pressure under reasonable control.  Follow.   

## 2019-04-04 NOTE — Assessment & Plan Note (Signed)
On simvastatin.   

## 2019-04-04 NOTE — ED Notes (Signed)
Returned from ct 

## 2019-04-04 NOTE — ED Notes (Signed)
Son- Beth Tyler- [phone]

## 2019-04-04 NOTE — ED Provider Notes (Signed)
[facility] Emergency Department Provider Note  ____________________________________________   First MD Initiated Contact with Patient 04/04/19 1606     (approximate)  I have reviewed the triage vital signs and the nursing notes.  History  Chief Complaint Bradycardia    HPI Beth Tyler is a 73 y.o. female with a history of recently diagnosed atrial fibrillation (12/10) on Eliquis, pancreatic insufficiency, chronic lymphedema and stasis dermatitis, obesity, COVID-19 (12/10) who presents the emergency department for generalized weakness, near syncope.  Patient states this morning she was in her normal state of health.  She was cooking soup, when she suddenly felt poorly, states she felt "off" and generally weak.  She sat down in a chair.  Family called EMS because she was lethargic and difficult to arouse.  On EMS arrival she was bradycardic into the upper 40s, hypotensive with blood pressure systolic 33I.  Given 0.5 mg atropine and IV fluids, with improvement in her mental status.  Alert and oriented on arrival to the ED, NSR 70, normotensive.  Patient denies any associated pain.  No lateralizing weakness, numbness, tingling, speech difficulties.  She reports compliance with her medications.  She denies the possibility of accidentally taking too much or mixing up her medications.  On chart review, patient recently admitted from 12/10-12/14 for new onset atrial fibrillation also found to be positive for COVID-19.  Completed a course of remdesivir and Decadron, initiated on anticoagulation. She does state since discharge she has not been eating well, had decreased PO intake. She has chronic issues with loose stool due to her pancreatic insufficiency.    Past Medical Hx Past Medical History:  Diagnosis Date  . Anemia   . Arthritis   . B12 deficiency   . Carpal tunnel syndrome, bilateral   . Diabetes mellitus ([place]) 02/17/2012  . Diabetes mellitus, type II  ([place])   . Dyspnea   . Gastritis   . GERD (gastroesophageal reflux disease)   . Hypercholesterolemia   . Hypertension   . IBS (irritable bowel syndrome)   . Neuropathy    legs  . Pernicious anemia   . Vertigo    no episodes fo 9 or 10 yrs    Problem List Patient Active Problem List   Diagnosis Date Noted  . COVID-19 virus infection 04/03/2019  . New onset atrial fibrillation ([place]) 03/18/2019  . Controlled type 2 diabetes mellitus with hyperglycemia ([place]) 03/18/2019  . Chronic stasis dermatitis 03/18/2019  . 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 ILD (interstitial lung disease) ([place]) 03/18/2019  . CKD (chronic kidney disease), stage III 08/08/2018  . GERD (gastroesophageal reflux disease) 08/08/2018  . Snoring 05/24/2018  . Arm numbness 07/06/2017  . Otitis externa 07/06/2017  . Aortic atherosclerosis ([place]) 10/22/2016  . Nausea 10/09/2016  . Lymphedema 03/11/2016  . SOB (shortness of breath) 02/25/2016  . Pain in limb 02/02/2016  . Back pain 05/23/2015  . Cough 02/13/2015  . BMI 40.0-44.9, adult ([place]) 08/10/2014  . Bilateral leg edema 08/10/2014  . Chest pain 07/17/2014  . Health care maintenance 07/17/2014  . Colon cancer screening 01/19/2014  . Stress 06/14/2013  . Chronic hyperkalemia 03/21/2013  . IBS (irritable bowel syndrome) 02/17/2012  . Hypertension 02/17/2012  . Hyperlipidemia 02/17/2012  . Diabetes mellitus ([place]) 02/17/2012  . B12 deficiency 02/17/2012    Past Surgical Hx Past Surgical History:  Procedure Laterality Date  . ANTERIOR VITRECTOMY Right 12/01/2017   Procedure: ANTERIOR VITRECTOMY;  Surgeon: Eulogio Bear, MD;  Location:  [place];  Service: Ophthalmology;  Laterality: Right;  . BREAST BIOPSY Right 1980   neg  . BREAST SURGERY  1980   benign cyst removal  . CATARACT EXTRACTION W/PHACO Left 10/13/2017   Procedure: CATARACT EXTRACTION PHACO AND INTRAOCULAR LENS PLACEMENT ([place])  LEFT  DIABETES;   Surgeon: Eulogio Bear, MD;  Location: [place];  Service: Ophthalmology;  Laterality: Left;  Diabetic - insulin and oral meds  . CATARACT EXTRACTION W/PHACO Right 12/01/2017   Procedure: CATARACT EXTRACTION PHACO AND INTRAOCULAR LENS PLACEMENT ([place])  RIGHT DIABETIC;  Surgeon: Eulogio Bear, MD;  Location: [place];  Service: Ophthalmology;  Laterality: Right;  Diabetic - insulin and oral  . CHOLECYSTECTOMY  1983    Medications Prior to Admission medications   Medication Sig Start Date End Date Taking? Authorizing Provider  acetaminophen (TYLENOL) 500 MG tablet Take 500 mg by mouth every 6 (six) hours as needed.    [provider]  amLODipine (NORVASC) 10 MG tablet Take 1 tablet by mouth once daily 03/17/19   Einar Pheasant, MD  apixaban (ELIQUIS) 5 MG TABS tablet Take 1 tablet (5 mg total) by mouth 2 (two) times daily. 03/22/19   Lorella Nimrod, MD  Blood Glucose Monitoring Suppl ([place]) w/Device KIT  07/05/17   [provider]  carvedilol (COREG) 25 MG tablet TAKE 1 TABLET BY MOUTH TWICE DAILY WITH A MEAL Patient taking differently: Take 12.5 mg by mouth 2 (two) times daily with a meal.  02/25/19   Einar Pheasant, MD  fluticasone (CUTIVATE) 0.05 % cream USE AS DIRECTED Patient taking differently: Apply 1 application topically as needed. Use as directed. 09/15/18   Einar Pheasant, MD  fluticasone furoate-vilanterol (BREO ELLIPTA) 200-25 MCG/INH AEPB Inhale 1 puff into the lungs daily. 01/01/18   Laverle Hobby, MD  Fluticasone-Salmeterol (ADVAIR DISKUS) 250-50 MCG/DOSE AEPB Inhale 1 puff into the lungs 2 (two) times daily. 10/29/17   Laverle Hobby, MD  hydrochlorothiazide (HYDRODIURIL) 25 MG tablet Take 1 tablet (25 mg total) by mouth daily. 08/04/18   Einar Pheasant, MD  Insulin Glargine (BASAGLAR KWIKPEN) 100 UNIT/ML SOPN Inject 0.3 mLs (30 Units total) into the skin daily. Patient taking differently: Inject 28  Units into the skin every morning.  06/15/18   Einar Pheasant, MD  insulin lispro ([name]) 100 UNIT/ML injection Inject up to 10 units TID with meals Patient taking differently: Inject 4-8 Units into the skin 3 (three) times daily with meals.  01/21/19   Einar Pheasant, MD  Insulin Pen Needle 32G X 4 MM MISC Use to inject insulin three times daily 07/20/18   Einar Pheasant, MD  lipase/protease/amylase ([name]) 36000 UNITS CPEP capsule Take 72,000 Units by mouth 3 (three) times daily before meals. 2 capsules TID w/ meals and 1 w/ snack 02/08/19 02/08/20  [provider]  lisinopril (ZESTRIL) 20 MG tablet TAKE 1 TABLET BY MOUTH IN THE MORNING AND 2 AT BEDTIME Patient taking differently: Take 20 mg by mouth 2 (two) times daily.  02/04/19   Einar Pheasant, MD  magnesium oxide (MAG-OX) 400 (241.3 Mg) MG tablet Take 1 tablet by mouth once daily 03/17/19   Einar Pheasant, MD  metFORMIN (GLUCOPHAGE-XR) 500 MG 24 hr tablet Take 2 tablets by mouth twice daily Patient taking differently: Take 500 mg by mouth 2 (two) times daily.  03/17/19   Einar Pheasant, MD  [facility] DELICA LANCETS 25K MISC  07/07/17   [provider]  Roma Schanz test strip USE  1 STRIP TO CHECK GLUCOSE TWICE DAILY 01/18/19   Einar Pheasant, MD  pantoprazole (PROTONIX) 40 MG tablet TAKE 1 TABLET BY MOUTH TWICE DAILY BEFORE MEAL(S) Patient taking differently: 20 mg 2 (two) times daily.  09/01/18   Einar Pheasant, MD  pioglitazone (ACTOS) 15 MG tablet Take 1 tablet by mouth once daily 02/23/19   Einar Pheasant, MD  simvastatin (ZOCOR) 10 MG tablet TAKE 1 TABLET BY MOUTH AT BEDTIME 03/17/19   Einar Pheasant, MD  triamcinolone cream (KENALOG) 0.1 % APPLY  CREAM TOPICALLY TWICE DAILY 03/17/19   Einar Pheasant, MD    Allergies Hydrocodone-acetaminophen, Percocet [oxycodone-acetaminophen], Demeclocycline, Penicillins, and Tetracyclines & related  Family Hx Family History  Problem Relation Age of Onset  . Diabetes Father    . Kidney disease Father   . Heart disease Father   . Alcohol abuse Mother   . Dementia Mother   . Diabetes Mother   . Diabetes Other   . Heart disease Son 96       CABG  . Hemophilia Brother   . Uterine cancer Sister   . Diabetes Sister   . COPD Sister   . Breast cancer Maternal Aunt 51  . Breast cancer Daughter 70  . Stroke Daughter     Social Hx Social History   Tobacco Use  . Smoking status: Never Smoker  . Smokeless tobacco: Never Used  Substance Use Topics  . Alcohol use: No    Alcohol/week: 0.0 standard drinks  . Drug use: No     Review of Systems  Constitutional: + Generalized weakness Eyes: Negative for visual changes. ENT: Negative for sore throat. Cardiovascular: Negative for chest pain. + Bradycardia Respiratory: Negative for shortness of breath. Gastrointestinal: Negative for nausea, vomiting.  Genitourinary: Negative for dysuria. Musculoskeletal: Negative for leg swelling. Skin: Negative for rash. Neurological: Negative for for headaches.   Physical Exam  Vital Signs: ED Triage Vitals  Enc Vitals Group     BP 04/04/19 1602 (!) 168/141     Pulse Rate 04/04/19 1602 68     Resp 04/04/19 1602 16     Temp 04/04/19 1602 (!) 97.5 F (36.4 C)     Temp Source 04/04/19 1602 Oral     SpO2 04/04/19 1602 96 %     Weight 04/04/19 1606 211 lb (95.7 kg)     Height 04/04/19 1606 5' 2" (1.575 m)     Head Circumference --      Peak Flow --      Pain Score 04/04/19 1604 0     Pain Loc --      Pain Edu? --      Excl. in [place]? --     Constitutional: Alert and oriented.  Appears fatigued. Head: Normocephalic. Atraumatic. Eyes: Conjunctivae clear. Sclera anicteric. Nose: No congestion. No rhinorrhea. Mouth/Throat: Wearing mask. MM dry.  Neck: No stridor.   Cardiovascular: Normal rate, regular rhythm. Extremities well perfused. Respiratory: Normal respiratory effort.  Lungs CTAB. Gastrointestinal: Soft. Non-tender. Non-distended.  Musculoskeletal:  Chronic, symmetric bilateral lower extremity edema, venous stasis. Neurologic:  Normal speech and language. No gross focal neurologic deficits are appreciated.  Skin: Skin is warm, dry and intact. No rash noted. Psychiatric: Mood and affect are appropriate for situation.  EKG  Personally reviewed.   Rate: 70 Rhythm: Sinus Axis: Leftward Intervals: Within normal limits Sinus tachycardia, no acute ischemic changes No STEMI    Radiology  CT A/P ordered, pending.    Procedures  Procedure(s) performed (including critical care):  Procedures   Initial Impression / Assessment and Plan / ED Course  73 y.o. female who presents to the ED for acute onset of generalized weakness, feeling "off", found to be bradycardic and hypotensive with EMS.  Improved on arrival to the emergency department after fluids and atropine x1.  Ddx: symptomatic bradycardia, electrolyte abnormality, infection, thyroid abnormality, dehydration  Will obtain labs, imaging, fluids and reassess  Work-up reveals hyponatremia to 125, hypochloremia to 91.  Consistent with her history of decreased intake. Sodium previously normal at 137. Will continue IVF infusion and plan to admit for further electrolyte correction and tele monitoring.  CT A/P added for further evaluation given her history of pancreatic insufficiency, family report fairly significant weight loss, epigastric/LUQ discomfort with eating causing decreased PO.   Discussed with hospitalist for admission.    Final Clinical Impression(s) / ED Diagnosis  Final diagnoses:  Symptomatic bradycardia  Generalized weakness       Note:  This document was prepared using Dragon voice recognition software and may include unintentional dictation errors.   Lilia Pro., MD 04/04/19 [phone]

## 2019-04-04 NOTE — Assessment & Plan Note (Signed)
Has seen GI.  Stable.  Persistent issues.  [name].

## 2019-04-04 NOTE — Assessment & Plan Note (Signed)
Recently admitted with afib with RVR.  Tested positive for covid.  Received remdesivir and decadron.  Finished decadron yesterday.  Denies sob.  Pulse ox 97% room air.  Treat symptoms.  Rest.  Fluids.  Continue to update [place] if any change or problems.

## 2019-04-04 NOTE — Assessment & Plan Note (Signed)
She denies any recent low blood sugars now.  She is eating.  Finished steroids yesterday.  Sugars doing better.  Follow met b and a1c.  

## 2019-04-04 NOTE — ED Notes (Signed)
Patient transported to CT 

## 2019-04-04 NOTE — Assessment & Plan Note (Signed)
Followed by nephrology.  Avoid antiinflammatories.  Follow metabolic panel.  

## 2019-04-04 NOTE — ED Notes (Signed)
Pt talking to daughter on the phone.

## 2019-04-04 NOTE — H&P (Signed)
Beth Tyler VXB:[id number] DOB: 1945-09-27 DOA: 04/04/2019     PCP: Einar Pheasant, MD   Supposed to follow-up with cardiology Dr Saralyn Pilar  on 06 April 2019  Patient arrived to ER on 04/04/19 at 1558  Patient coming from: home Lives  With family    Chief Complaint:   Chief Complaint  Patient presents with  . Bradycardia    HPI: ELCIE PELSTER is a 73 y.o. female with medical history significant of new onset A. fib CKD 3, pancreatic insufficiency, chronic lymphedema, stasis dermatitis, hypertension, obesity, recent COVID infection, diabetes pancreatic exocrine dysfunction syndrome, and structural lung disease,    Presented with   Near syncope, difficult to arouse hypotensive bradycardic down to 48, gave her atropine 0.5 mg heart rate increased her to 68 never required to be paced and improved her HR Initially blood pressure in the 80s given 800 mL bolus CBG was 141 patient improved and was able to be transferred emergency department  Covid tested positive on 03/18/2019 Since her DC had decreased PO intake.  Recently was admitted with A. fib with RVR managed with Cardizem drip and then transitioned to Cardizem and carvedilol p.o.  Prior to discharge Cardizem was discontinued but she was discharged on carvedilol alone.  She was started on Eliquis  Incidentally found to be Covid positive inflammation markers elevated she was treated with remdesivir and Decadron discharged to home to complete 5 days of Decadron on 14th December  Of note during her hospitalization her heart rate has been routinely staying in 50s Infectious risk factors:  Reports confusion   KNOWN COVID POSITIVE   Lab Results  Component Value Date   [place] (A) 03/18/2019     Regarding pertinent Chronic problems:    Hyperlipidemia -   on statins Zocor   HTN on Norvasc carvedilol hydrochlorothiazide lisinopril  Pancreatic insufficiency on [name]     DM 2 -  Lab Results    Component Value Date   HGBA1C 8.3 (H) 03/19/2019   on insulin,        Morbid obesity-   BMI Readings from Last 1 Encounters:  04/04/19 38.59 kg/m     A. Fib -  - CHA2DS2 vas score 3 :  current  on anticoagulation with  Eliquis,           -  Rate control:  Currently controlled with Coreg        Last echo was 19 March 2019 showed preserved EF    While in ER: Found to be hyponatremic TSH wnl    The following Work up has been ordered so far:  Orders Placed This Encounter  Procedures  . CBC with Differential  . Comprehensive metabolic panel  . Urinalysis, Complete w Microscopic  . Urine Drug Screen, Qualitative  . TSH  . T4, free  . Magnesium  . Consult to hospitalist  ALL PATIENTS BEING ADMITTED/HAVING PROCEDURES NEED COVID-19 SCREENING  . EKG 12-Lead     Following Medications were ordered in ER: Medications  sodium chloride 0.9 % bolus 500 mL (0 mLs Intravenous Stopped 04/04/19 1719)  0.9 %  sodium chloride infusion ( Intravenous New Bag/Given 04/04/19 1730)        Consult Orders  (From admission, onward)         Start     Ordered   04/04/19 1809  Consult to hospitalist  ALL PATIENTS BEING ADMITTED/HAVING PROCEDURES NEED COVID-19 SCREENING  Once    Comments: ALL PATIENTS BEING ADMITTED/HAVING  PROCEDURES NEED COVID-19 SCREENING  Provider:  (Not yet assigned)  Question Answer Comment  Place call to: hospitalist   Reason for Consult Admit   Diagnosis/Clinical Info for Consult: hypona, symptomatic brady, syncope      04/04/19 1808           Significant initial  Findings: Abnormal Labs Reviewed  CBC WITH DIFFERENTIAL/PLATELET - Abnormal; Notable for the following components:      Result Value   Hemoglobin 11.5 (*)    HCT 33.0 (*)    All other components within normal limits  COMPREHENSIVE METABOLIC PANEL - Abnormal; Notable for the following components:   Sodium 125 (*)    Chloride 91 (*)    Glucose, Bld 101 (*)    Calcium 8.3 (*)    Albumin  3.2 (*)    All other components within normal limits  T4, FREE - Abnormal; Notable for the following components:   Free T4 1.53 (*)    All other components within normal limits    Otherwise labs showing:    Recent Labs  Lab 04/04/19 1603  NA 125*  K 4.6  CO2 25  GLUCOSE 101*  BUN 15  CREATININE 0.82  CALCIUM 8.3*  MG 1.7    Cr    stable,    Lab Results  Component Value Date   CREATININE 0.82 04/04/2019   CREATININE 0.93 03/22/2019   CREATININE 0.96 03/20/2019    Recent Labs  Lab 04/04/19 1603  AST 18  ALT 18  ALKPHOS 61  BILITOT 0.7  PROT 6.7  ALBUMIN 3.2*   Lab Results  Component Value Date   CALCIUM 8.3 (L) 04/04/2019      WBC      Component Value Date/Time   WBC 7.5 04/04/2019 1603   ANC    Component Value Date/Time   NEUTROABS 5.1 04/04/2019 1603   ALC No components found for: LYMPHAB    Plt: Lab Results  Component Value Date   PLT 204 04/04/2019      COVID-19 Labs    Lab Results  Component Value Date   SARSCOV2NAA POSITIVE (A) 03/18/2019     HG/HCT      stable,       Component Value Date/Time   HGB 11.5 (L) 04/04/2019 1603   HCT 33.0 (L) 04/04/2019 1603     Troponin 8 ordered       ECG: Ordered Personally reviewed by me showing: HR : 70 Rhythm:  NSR    no evidence of ischemic changes QTC 441   BNP (last 3 results) Recent Labs    03/18/19 1059  BNP 242.0*    ProBNP (last 3 results) No results for input(s): PROBNP in the last 8760 hours.  DM  labs:  HbA1C: Recent Labs    09/10/18 1023 03/12/19 1017 03/19/19 0527  HGBA1C 7.8* 7.8* 8.3*        UA  not ordered      Ordered   CXR - Progressive bibasilar airspace disease, right greater than left  Ct abd - wnl   ED Triage Vitals  Enc Vitals Group     BP 04/04/19 1602 (!) 168/141     Pulse Rate 04/04/19 1602 68     Resp 04/04/19 1602 16     Temp 04/04/19 1602 (!) 97.5 F (36.4 C)     Temp Source 04/04/19 1602 Oral     SpO2 04/04/19 1602 96 %      Weight 04/04/19 1606 211 lb (95.7  kg)     Height 04/04/19 1606 _0  (1.575 m)     Head Circumference --      Peak Flow --      Pain Score 04/04/19 1604 0     Pain Loc --      Pain Edu? --      Excl. in [place]? --   TMAX(24)@       Latest  Blood pressure (!) 154/76, pulse 61, temperature (!) 97.5 F (36.4 C), temperature source Oral, resp. rate 16, height _1  (1.575 m), weight 95.7 kg, SpO2 98 %.    Hospitalist was called for admission for Syncope and symptomatic bradycardia with dehydration  Review of Systems:    Pertinent positives include: lightheadedness   Constitutional:  No weight loss, night sweats, Fevers, chills, fatigue, weight loss  HEENT:  No headaches, Difficulty swallowing,Tooth/dental problems,Sore throat,  No sneezing, itching, ear ache, nasal congestion, post nasal drip,  Cardio-vascular:  No chest pain, Orthopnea, PND, anasarca, dizziness, palpitations.no Bilateral lower extremity swelling  GI:  No heartburn, indigestion, abdominal pain, nausea, vomiting, diarrhea, change in bowel habits, loss of appetite, melena, blood in stool, hematemesis Resp:  no shortness of breath at rest. No dyspnea on exertion, No excess mucus, no productive cough, No non-productive cough, No coughing up of blood.No change in color of mucus.No wheezing. Skin:  no rash or lesions. No jaundice GU:  no dysuria, change in color of urine, no urgency or frequency. No straining to urinate.  No flank pain.  Musculoskeletal:  No joint pain or no joint swelling. No decreased range of motion. No back pain.  Psych:  No change in mood or affect. No depression or anxiety. No memory loss.  Neuro: no localizing neurological complaints, no tingling, no weakness, no double vision, no gait abnormality, no slurred speech, no confusion  All systems reviewed and apart from [place] all are negative  Past Medical History:   Past Medical History:  Diagnosis Date  . Anemia   . Arthritis   . B12  deficiency   . Carpal tunnel syndrome, bilateral   . Diabetes mellitus ([place]) 02/17/2012  . Diabetes mellitus, type II ([place])   . Dyspnea   . Gastritis   . GERD (gastroesophageal reflux disease)   . Hypercholesterolemia   . Hypertension   . IBS (irritable bowel syndrome)   . Neuropathy    legs  . Pernicious anemia   . Vertigo    no episodes fo 9 or 10 yrs      Past Surgical History:  Procedure Laterality Date  . ANTERIOR VITRECTOMY Right 12/01/2017   Procedure: ANTERIOR VITRECTOMY;  Surgeon: Eulogio Bear, MD;  Location: [facility];  Service: Ophthalmology;  Laterality: Right;  . BREAST BIOPSY Right 1980   neg  . BREAST SURGERY  1980   benign cyst removal  . CATARACT EXTRACTION W/PHACO Left 10/13/2017   Procedure: CATARACT EXTRACTION PHACO AND INTRAOCULAR LENS PLACEMENT ([place])  LEFT  DIABETES;  Surgeon: Eulogio Bear, MD;  Location: [facility];  Service: Ophthalmology;  Laterality: Left;  Diabetic - insulin and oral meds  . CATARACT EXTRACTION W/PHACO Right 12/01/2017   Procedure: CATARACT EXTRACTION PHACO AND INTRAOCULAR LENS PLACEMENT ([place])  RIGHT DIABETIC;  Surgeon: Eulogio Bear, MD;  Location: [facility];  Service: Ophthalmology;  Laterality: Right;  Diabetic - insulin and oral  . CHOLECYSTECTOMY  1983    Social History:  Ambulatory  Independently     reports that she has  never smoked. She has never used smokeless tobacco. She reports that she does not drink alcohol or use drugs.   Family History:   Family History  Problem Relation Age of Onset  . Diabetes Father   . Kidney disease Father   . Heart disease Father   . Alcohol abuse Mother   . Dementia Mother   . Diabetes Mother   . Diabetes Other   . Heart disease Son 54       CABG  . Hemophilia Brother   . Uterine cancer Sister   . Diabetes Sister   . COPD Sister   . Breast cancer Maternal Aunt 51  . Breast cancer Daughter 53  . Stroke Daughter      Allergies: Allergies  Allergen Reactions  . Hydrocodone-Acetaminophen Anaphylaxis  . Percocet [Oxycodone-Acetaminophen] Anaphylaxis  . Demeclocycline Nausea Only  . Penicillins Nausea Only    Did it involve swelling of the face/tongue/throat, SOB, or low BP? No Did it involve sudden or severe rash/hives, skin peeling, or any reaction on the inside of your mouth or nose? No Did you need to seek medical attention at a hospital or doctor's office? No When did it last happen? If all above answers are "NO", may proceed with cephalosporin use.  . Tetracyclines & Related Nausea Only     Prior to Admission medications   Medication Sig Start Date End Date Taking? Authorizing Provider  acetaminophen (TYLENOL) 500 MG tablet Take 500 mg by mouth every 6 (six) hours as needed.    [provider]  amLODipine (NORVASC) 10 MG tablet Take 1 tablet by mouth once daily 03/17/19   Einar Pheasant, MD  apixaban (ELIQUIS) 5 MG TABS tablet Take 1 tablet (5 mg total) by mouth 2 (two) times daily. 03/22/19   Lorella Nimrod, MD  Blood Glucose Monitoring Suppl ([place]) w/Device KIT  07/05/17   [provider]  carvedilol (COREG) 25 MG tablet TAKE 1 TABLET BY MOUTH TWICE DAILY WITH A MEAL Patient taking differently: Take 12.5 mg by mouth 2 (two) times daily with a meal.  02/25/19   Einar Pheasant, MD  fluticasone (CUTIVATE) 0.05 % cream USE AS DIRECTED Patient taking differently: Apply 1 application topically as needed. Use as directed. 09/15/18   Einar Pheasant, MD  fluticasone furoate-vilanterol (BREO ELLIPTA) 200-25 MCG/INH AEPB Inhale 1 puff into the lungs daily. 01/01/18   Laverle Hobby, MD  Fluticasone-Salmeterol (ADVAIR DISKUS) 250-50 MCG/DOSE AEPB Inhale 1 puff into the lungs 2 (two) times daily. 10/29/17   Laverle Hobby, MD  hydrochlorothiazide (HYDRODIURIL) 25 MG tablet Take 1 tablet (25 mg total) by mouth daily. 08/04/18   Einar Pheasant, MD   Insulin Glargine (BASAGLAR KWIKPEN) 100 UNIT/ML SOPN Inject 0.3 mLs (30 Units total) into the skin daily. Patient taking differently: Inject 28 Units into the skin every morning.  06/15/18   Einar Pheasant, MD  insulin lispro ([name]) 100 UNIT/ML injection Inject up to 10 units TID with meals Patient taking differently: Inject 4-8 Units into the skin 3 (three) times daily with meals.  01/21/19   Einar Pheasant, MD  Insulin Pen Needle 32G X 4 MM MISC Use to inject insulin three times daily 07/20/18   Einar Pheasant, MD  lipase/protease/amylase ([name]) 36000 UNITS CPEP capsule Take 72,000 Units by mouth 3 (three) times daily before meals. 2 capsules TID w/ meals and 1 w/ snack 02/08/19 02/08/20  [provider]  lisinopril (ZESTRIL) 20 MG tablet TAKE 1 TABLET BY MOUTH IN THE  MORNING AND 2 AT BEDTIME Patient taking differently: Take 20 mg by mouth 2 (two) times daily.  02/04/19   Einar Pheasant, MD  magnesium oxide (MAG-OX) 400 (241.3 Mg) MG tablet Take 1 tablet by mouth once daily 03/17/19   Einar Pheasant, MD  metFORMIN (GLUCOPHAGE-XR) 500 MG 24 hr tablet Take 2 tablets by mouth twice daily Patient taking differently: Take 500 mg by mouth 2 (two) times daily.  03/17/19   Einar Pheasant, MD  [facility] DELICA LANCETS 40C MISC  07/07/17   [provider]  [facility] VERIO test strip USE 1 STRIP TO CHECK GLUCOSE TWICE DAILY 01/18/19   Einar Pheasant, MD  pantoprazole (PROTONIX) 40 MG tablet TAKE 1 TABLET BY MOUTH TWICE DAILY BEFORE MEAL(S) Patient taking differently: 20 mg 2 (two) times daily.  09/01/18   Einar Pheasant, MD  pioglitazone (ACTOS) 15 MG tablet Take 1 tablet by mouth once daily 02/23/19   Einar Pheasant, MD  simvastatin (ZOCOR) 10 MG tablet TAKE 1 TABLET BY MOUTH AT BEDTIME 03/17/19   Einar Pheasant, MD  triamcinolone cream (KENALOG) 0.1 % APPLY  CREAM TOPICALLY TWICE DAILY 03/17/19   Einar Pheasant, MD   Physical Exam: Blood pressure (!) 154/76, pulse 61, temperature  (!) 97.5 F (36.4 C), temperature source Oral, resp. rate 16, height _0  (1.575 m), weight 95.7 kg, SpO2 98 %. 1. General:  in No  Acute distress   Chronically ill -appearing 2. Psychological: Alert and  Oriented 3. Head/ENT:    Dry Mucous Membranes                          Head Non traumatic, neck supple                           Poor Dentition 4. SKIN:   decreased Skin turgor,  Skin clean Dry and intact no rash 5. Heart: Regular rate and rhythm no  Murmur, no Rub or gallop 6. Lungs: Clear to auscultation bilaterally, no wheezes or crackles   7. Abdomen: Soft,   non-tender, Non distended   obese  bowel sounds present 8. Lower extremities: no clubbing, cyanosis, no edema 9. Neurologically Grossly intact, moving all 4 extremities equally  10. MSK: Normal range of motion   All other LABS:     Recent Labs  Lab 04/04/19 1603  WBC 7.5  NEUTROABS 5.1  HGB 11.5*  HCT 33.0*  MCV 82.7  PLT 204     Recent Labs  Lab 04/04/19 1603  NA 125*  K 4.6  CL 91*  CO2 25  GLUCOSE 101*  BUN 15  CREATININE 0.82  CALCIUM 8.3*  MG 1.7     Recent Labs  Lab 04/04/19 1603  AST 18  ALT 18  ALKPHOS 61  BILITOT 0.7  PROT 6.7  ALBUMIN 3.2*       Cultures:    Component Value Date/Time   SDES  12/03/2017 1732    URINE, CLEAN CATCH Performed at [facility], [address], [place], [place] [address]    [facility]  12/03/2017 1732    NONE Performed at [facility], [address] [place], [place] [address]    CULT (A) 12/03/2017 1732    <10,000 COLONIES/mL INSIGNIFICANT GROWTH Performed at [facility], [place] [address] [place], [place] [address]    REPTSTATUS 12/05/2017 FINAL 12/03/2017 1732     Radiological Exams on Admission:  CT Abdomen Pelvis W Contrast  Result Date: 04/04/2019 CLINICAL DATA:  Appetite loss pancreatitis, COVID EXAM: CT ABDOMEN AND PELVIS WITH CONTRAST TECHNIQUE: Multidetector CT imaging of the abdomen and pelvis was  performed using the standard protocol following bolus administration of intravenous contrast. CONTRAST:  173m OMNIPAQUE IOHEXOL 300 MG/ML  SOLN COMPARISON:  None. FINDINGS: Lower chest: The visualized heart size within normal limits. No pericardial fluid/thickening. No hiatal hernia. There patchy ground-glass at both bases right greater than left. Hepatobiliary: The liver is normal in density without focal abnormality.The main portal vein is patent. The patient is status post cholecystectomy. No biliary ductal dilation. Pancreas:  Mild fatty atrophy noted the pancreas. Spleen: Normal in size without focal abnormality. Adrenals/Urinary Tract: Both adrenal glands appear normal. The kidneys and collecting system appear normal without evidence of urinary tract calculus or hydronephrosis. Bladder is unremarkable. Stomach/Bowel: The stomach, small bowel, and colon are normal in appearance. No inflammatory changes, wall thickening, or obstructive findings. Vascular/Lymphatic: There are no enlarged mesenteric, retroperitoneal, or pelvic lymph nodes. Scattered aortic atherosclerotic calcifications are seen without aneurysmal dilatation. Reproductive: The uterus and adnexa are unremarkable. Other: Small fat containing anterior hernia noted. Musculoskeletal: No acute or significant osseous findings. IMPRESSION: Patchy ground-glass opacity seen at both lung bases right greater than left likely consistent with atelectasis and/or viral pneumonia. No acute intra-abdominal or pelvic pathology to explain the patient's symptoms. Aortic Atherosclerosis (ICD10-I70.0). Electronically Signed   By: BPrudencio PairM.D.   On: 04/04/2019 21:32   DG Chest Port 1 View  Result Date: 04/04/2019 CLINICAL DATA:  Found unresponsive. The patient tested positive for COVID-19 17 days ago. EXAM: PORTABLE CHEST 1 VIEW COMPARISON:  One-view chest x-ray 03/18/2019 FINDINGS: The heart size is normal. Progressive bibasilar airspace disease is present,  right greater than left. Changes of COPD are again noted. There is no edema or effusion. IMPRESSION: 1. Progressive bibasilar airspace disease, right greater than left. While this may represent atelectasis, infection is also considered. 2. Changes of COPD. Electronically Signed   By: CSan MorelleM.D.   On: 04/04/2019 19:59    Chart has been reviewed    Assessment/Plan   73y.o. female with medical history significant of new onset A. fib CKD 3, pancreatic insufficiency, chronic lymphedema, stasis dermatitis, hypertension, obesity, recent COVID infection, diabetes pancreatic exocrine dysfunction syndrome, and structural lung disease,  Admitted for symptomatic bradycardia dehydration  Present on Admission: . Bradycardia -we will hold Coreg and observe.  TSH unremarkable.  Recently had an echogram.  . Hyperlipidemia chronic stable continue home medications  . Hypertension -allow permissive hypertension for today  . CKD (chronic kidney disease), stage III chronic stable gently rehydrate avoid nephrotoxic medications  . GERD (gastroesophageal reflux disease) chronic stable continue home meds  . Controlled type 2 diabetes mellitus with hyperglycemia (HCC) -  - Order Sensitive  SSI   - continue home insulin regimen * or switch to   Lantus * units,  -  check TSH and HgA1C  - Hold by mouth medications    . ILD (interstitial lung disease) ([place] -currently appears to be stable  History of Covid -still within 21 days of diagnosis with recent admission finished her steroids dose, was treated with remdesivir.  Continue to observe for development of any complications Continue airborne precautions  . AF (paroxysmal atrial fibrillation) (HCC) -           - CHA2DS2 vas score >3: continue current anticoagulation with   Eliquis,           -  Rate control:  Currently controlled  Hold off on Coreg due to recent bradycardia will need follow-up with cardiology as an outpatient already was  scheduled for 29th   . Dehydration rehydrate and follow fluid status  . Hyponatremia -obtain urine electrolytes most likely secondary to dehydration will rehydrate and follow  Pancreatic insufficiency continue [name] CT showing no evidence of acute pancreatittis  Interstitial lung disease continue home meds  Other plan as per orders.  DVT prophylaxis:  Eliquis  Code Status:  FULL CODE   as per patient   I had personally discussed CODE STATUS with patient   Family Communication:   Family not at  Bedside    Disposition Plan:   To home once workup is complete and patient is stable                     Would benefit from PT/OT eval prior to DC  Ordered                                       Consults called: none    Admission status:  ED Disposition    ED Disposition Condition [place]: [place] [[id number]]  Level of Care: Telemetry [5]  Covid Evaluation: Confirmed COVID Positive  Diagnosis: Bradycardia [[id number]]  Admitting Physician: Toy Baker [[id number]]  Attending Physician: Toy Baker [[id number]]       Obs     Level of care     tele  For  24H       Precautions: admitted as covid positive Airborne and Contact precautions   PPE: Used by the provider:   P100  eye Goggles,  Gloves  gown    Jo Booze 04/05/2019, 4:01 AM    [facility]     after 2 AM please page floor coverage PA If 7AM-7PM, please contact the day team taking care of the patient using [email or web address]

## 2019-04-05 LAB — GLUCOSE, CAPILLARY
Glucose-Capillary: 76 mg/dL (ref 70–99)
Glucose-Capillary: 99 mg/dL (ref 70–99)
Glucose-Capillary: 97 mg/dL (ref 70–99)

## 2019-04-05 LAB — COMPREHENSIVE METABOLIC PANEL
ALT: 17 U/L (ref 0–44)
AST: 17 U/L (ref 15–41)
Albumin: 3 g/dL — ABNORMAL LOW (ref 3.5–5.0)
Alkaline Phosphatase: 63 U/L (ref 38–126)
Anion gap: 11 (ref 5–15)
BUN: 10 mg/dL (ref 8–23)
CO2: 23 mmol/L (ref 22–32)
Calcium: 8.5 mg/dL — ABNORMAL LOW (ref 8.9–10.3)
Chloride: 97 mmol/L — ABNORMAL LOW (ref 98–111)
Creatinine, Ser: 0.74 mg/dL (ref 0.44–1.00)
GFR calc Af Amer: >60 mL/min (ref >60)
GFR calc non Af Amer: >60 mL/min (ref >60)
Glucose, Bld: 106 mg/dL — ABNORMAL HIGH (ref 70–99)
Potassium: 3.9 mmol/L (ref 3.5–5.1)
Sodium: 131 mmol/L — ABNORMAL LOW (ref 135–145)
Total Bilirubin: 0.6 mg/dL (ref 0.3–1.2)
Total Protein: 6.8 g/dL (ref 6.5–8.1)

## 2019-04-05 LAB — CBC
HCT: 33.3 % — ABNORMAL LOW (ref 36.0–46.0)
Hemoglobin: 11.3 g/dL — ABNORMAL LOW (ref 12.0–15.0)
MCH: 29 pg (ref 26.0–34.0)
MCHC: 33.9 g/dL (ref 30.0–36.0)
MCV: 85.6 fL (ref 80.0–100.0)
Platelets: 203 10*3/uL (ref 150–400)
RBC: 3.89 MIL/uL (ref 3.87–5.11)
RDW: 13.1 % (ref 11.5–15.5)
WBC: 6.5 10*3/uL (ref 4.0–10.5)
nRBC: 0 % (ref 0.0–0.2)

## 2019-04-05 LAB — HEMOGLOBIN A1C
Hgb A1c MFr Bld: 8.1 % — ABNORMAL HIGH (ref 4.8–5.6)
Mean Plasma Glucose: 185.77 mg/dL

## 2019-04-05 LAB — MAGNESIUM: Magnesium: 2.2 mg/dL (ref 1.7–2.4)

## 2019-04-05 LAB — TSH: TSH: 0.914 u[IU]/mL (ref 0.350–4.500)

## 2019-04-05 LAB — OSMOLALITY: Osmolality: 262 mOsm/kg — ABNORMAL LOW (ref 275–295)

## 2019-04-05 LAB — PHOSPHORUS: Phosphorus: 3.5 mg/dL (ref 2.5–4.6)

## 2019-04-05 LAB — OSMOLALITY, URINE: Osmolality, Ur: 194 mOsm/kg — ABNORMAL LOW (ref 300–900)

## 2019-04-05 MED ORDER — PANTOPRAZOLE SODIUM 40 MG PO TBEC: 20.0000 mg | DELAYED_RELEASE_TABLET | Freq: Two times a day (BID) | ORAL | Status: DC

## 2019-04-05 MED ORDER — BASAGLAR KWIKPEN 100 UNIT/ML ~~LOC~~ SOPN: 28.0000 [IU] | PEN_INJECTOR | Freq: Every morning | SUBCUTANEOUS | Status: DC

## 2019-04-05 MED ORDER — METFORMIN HCL ER 500 MG PO TB24: 500.0000 mg | ORAL_TABLET | Freq: Two times a day (BID) | ORAL | Status: DC

## 2019-04-05 MED ORDER — INSULIN LISPRO 100 UNIT/ML ~~LOC~~ SOLN: 4.0000 [IU] | Freq: Three times a day (TID) | SUBCUTANEOUS | Status: DC

## 2019-04-05 MED ORDER — LISINOPRIL 20 MG PO TABS: 20.0000 mg | ORAL_TABLET | Freq: Two times a day (BID) | ORAL | Status: DC

## 2019-04-05 MED ORDER — FLUTICASONE PROPIONATE 0.05 % EX CREA: 1.0000 "application " | TOPICAL_CREAM | CUTANEOUS | Status: DC | PRN

## 2019-04-05 NOTE — ED Notes (Signed)
Sherrie, eldest daughter, called and given info for pt and POC for admission

## 2019-04-05 NOTE — ED Notes (Signed)
esig does not work.   

## 2019-04-05 NOTE — ED Notes (Signed)
Pharmacy messaged for missing doses for 2300

## 2019-04-05 NOTE — ED Notes (Signed)
Pt assisted to bedside toilet by Vaughan Basta, tech, pt requesting tylenol and check of right AC IV "feels tingly"

## 2019-04-06 ENCOUNTER — Ambulatory Visit: Payer: Medicare HMO

## 2019-04-06 ENCOUNTER — Ambulatory Visit: Payer: Self-pay | Admitting: *Deleted

## 2019-04-06 ENCOUNTER — Other Ambulatory Visit: Payer: Self-pay | Admitting: Internal Medicine

## 2019-04-06 ENCOUNTER — Telehealth: Payer: Self-pay

## 2019-04-06 DIAGNOSIS — I480 Paroxysmal atrial fibrillation: Secondary | ICD-10-CM | POA: Diagnosis not present

## 2019-04-06 DIAGNOSIS — N183 Chronic kidney disease, stage 3 unspecified: Secondary | ICD-10-CM | POA: Insufficient documentation

## 2019-04-06 DIAGNOSIS — E1169 Type 2 diabetes mellitus with other specified complication: Secondary | ICD-10-CM | POA: Diagnosis not present

## 2019-04-06 DIAGNOSIS — I10 Essential (primary) hypertension: Secondary | ICD-10-CM | POA: Diagnosis not present

## 2019-04-06 DIAGNOSIS — Z1231 Encounter for screening mammogram for malignant neoplasm of breast: Secondary | ICD-10-CM

## 2019-04-06 DIAGNOSIS — R0602 Shortness of breath: Secondary | ICD-10-CM | POA: Diagnosis not present

## 2019-04-06 DIAGNOSIS — E1122 Type 2 diabetes mellitus with diabetic chronic kidney disease: Secondary | ICD-10-CM | POA: Insufficient documentation

## 2019-04-06 NOTE — Telephone Encounter (Signed)
Unable to reach patient for transitional care management. No answer. Left message to call me back at the office. Will follow as appropriate.

## 2019-04-07 ENCOUNTER — Other Ambulatory Visit: Payer: Self-pay | Admitting: *Deleted

## 2019-04-07 ENCOUNTER — Other Ambulatory Visit: Payer: Self-pay | Admitting: Pharmacy Technician

## 2019-04-07 NOTE — Patient Outreach (Signed)
Outreach call to pt for initial telephonic assessment/ 2nd attempt, no answer to telephone and no option to leave voicemail.  PLAN Outreach pt in 3-4 business days  Beth Tyler [facility], [place] [phone]

## 2019-04-07 NOTE — Patient Outreach (Signed)
[facility]) Care Management  04/07/2019  Beth Tyler 31-Jan-1946 [id number]                                        Medication Assistance Referral  Referral From: [facility] Embedded RPh Catie T.   Medication/Company: [name] and Basaglar / [name] Patient application portion:  Education officer, museum portion:  N/A Embedded pharmacist to have signed while in clinic to Dr. Einar Pheasant Provider address/fax verified via: Office website    Follow up:  Will follow up with patient in 5-10 business days to confirm application(s) have been received.  Cortnie Ringel P. Breylen Agyeman, [place] Management [phone]

## 2019-04-07 NOTE — Telephone Encounter (Signed)
Transition Care Management Follow-up Telephone Call   Date discharged? 04/05/19   How have you been since you were released from the hospital? Patient states,"I am doing much better and feel more like myself".    Do you understand why you were in the hospital? Yes.    Do you understand the discharge instructions? Yes, increase activity as tolerated. Wear heart monitor and follow up with Cardiology and pcp.    Where were you discharged to? Home.    Items Reviewed:  Medications reviewed: Yes, taking all medications as prescribed. Stop carvedilol.  Allergies reviewed: None new.  Dietary changes reviewed: Yes, low sodium/heart healthy.  Referrals reviewed: Yes, Cardiology.    Functional Questionnaire:   Activities of Daily Living (ADLs):   She states they are independent in the following: Independent in all ADLs.  States they require assistance with the following: She does not require any assistance with ADLs.   Any transportation issues/concerns?: None at this time.    Any patient concerns? None at this time.    Confirmed importance and date/time of follow-up visits scheduled Yes, appointment scheduled 04/13/19 @ 1030 via doxy [phone].  Provider Appointment booked with Dr. Nicki Reaper, pcp.   Confirmed with patient if condition begins to worsen call PCP or go to the ER.  Patient was given the office number and encouraged to call back with question or concerns.  : Yes.

## 2019-04-08 DIAGNOSIS — E1122 Type 2 diabetes mellitus with diabetic chronic kidney disease: Secondary | ICD-10-CM | POA: Diagnosis not present

## 2019-04-08 DIAGNOSIS — E871 Hypo-osmolality and hyponatremia: Secondary | ICD-10-CM | POA: Diagnosis not present

## 2019-04-08 DIAGNOSIS — D631 Anemia in chronic kidney disease: Secondary | ICD-10-CM | POA: Diagnosis not present

## 2019-04-08 DIAGNOSIS — I10 Essential (primary) hypertension: Secondary | ICD-10-CM | POA: Diagnosis not present

## 2019-04-08 DIAGNOSIS — N1831 Chronic kidney disease, stage 3a: Secondary | ICD-10-CM | POA: Diagnosis not present

## 2019-04-12 ENCOUNTER — Telehealth: Payer: Self-pay | Admitting: Internal Medicine

## 2019-04-12 MED ORDER — ONDANSETRON 4 MG PO TBDP: 4.0000 mg | ORAL_TABLET | Freq: Two times a day (BID) | ORAL | 0 refills | Status: DC | PRN

## 2019-04-12 NOTE — Telephone Encounter (Signed)
Patient is ok to take zofran, no allergy noted. Pt says she does not know if she has ever had it but would be agreeable to try. I can send in for her just wanted to clarify what directions you wanted.

## 2019-04-12 NOTE — Telephone Encounter (Signed)
If agreeabe, can try zofran (confirm no allergy).  Zofran 4mg .  I can send in rx if needed.

## 2019-04-12 NOTE — Telephone Encounter (Signed)
Patient called in wanting to know if there is anything OTC or prescription that you recommend for nausea. She has not tried anything yet. Has f/u scheduled with you tomorrow. Tested positive for COVID beginning of December. She started having loose stools and nausea yesterday afternoon. No abdominal pain. Pt stated it comes in waves. Pt stated she is doing ok other wise. Oxygen levels staying around 99%, no fever, SOB, etc. She has used dramamine in the past and tolerated ok.

## 2019-04-12 NOTE — Telephone Encounter (Signed)
Pt Wanted to know if there is anything she can take for nausea

## 2019-04-12 NOTE — Telephone Encounter (Signed)
rx sent in to [facility] [place].

## 2019-04-13 ENCOUNTER — Encounter: Payer: Self-pay | Admitting: Internal Medicine

## 2019-04-13 ENCOUNTER — Ambulatory Visit (INDEPENDENT_AMBULATORY_CARE_PROVIDER_SITE_OTHER): Payer: Medicare HMO | Admitting: Internal Medicine

## 2019-04-13 ENCOUNTER — Other Ambulatory Visit: Payer: Self-pay

## 2019-04-13 ENCOUNTER — Other Ambulatory Visit: Payer: Self-pay | Admitting: *Deleted

## 2019-04-13 DIAGNOSIS — I480 Paroxysmal atrial fibrillation: Secondary | ICD-10-CM

## 2019-04-13 DIAGNOSIS — E785 Hyperlipidemia, unspecified: Secondary | ICD-10-CM

## 2019-04-13 DIAGNOSIS — I10 Essential (primary) hypertension: Secondary | ICD-10-CM

## 2019-04-13 DIAGNOSIS — I700 Atherosclerosis of aorta: Secondary | ICD-10-CM

## 2019-04-13 DIAGNOSIS — N1831 Chronic kidney disease, stage 3a: Secondary | ICD-10-CM

## 2019-04-13 DIAGNOSIS — E139 Other specified diabetes mellitus without complications: Secondary | ICD-10-CM | POA: Diagnosis not present

## 2019-04-13 DIAGNOSIS — K219 Gastro-esophageal reflux disease without esophagitis: Secondary | ICD-10-CM | POA: Diagnosis not present

## 2019-04-13 DIAGNOSIS — R001 Bradycardia, unspecified: Secondary | ICD-10-CM

## 2019-04-13 DIAGNOSIS — E1140 Type 2 diabetes mellitus with diabetic neuropathy, unspecified: Secondary | ICD-10-CM

## 2019-04-13 DIAGNOSIS — U071 COVID-19: Secondary | ICD-10-CM | POA: Diagnosis not present

## 2019-04-13 DIAGNOSIS — Z794 Long term (current) use of insulin: Secondary | ICD-10-CM

## 2019-04-13 DIAGNOSIS — E871 Hypo-osmolality and hyponatremia: Secondary | ICD-10-CM

## 2019-04-13 DIAGNOSIS — J849 Interstitial pulmonary disease, unspecified: Secondary | ICD-10-CM

## 2019-04-13 DIAGNOSIS — E860 Dehydration: Secondary | ICD-10-CM

## 2019-04-13 NOTE — Progress Notes (Signed)
Patient ID: Beth Tyler, female   DOB: 04/26/1945, 74 y.o.   MRN: [id number]   Virtual Visit via video Note  This visit type was conducted due to national recommendations for restrictions regarding the COVID-19 pandemic (e.g. social distancing).  This format is felt to be most appropriate for this patient at this time.  All issues noted in this document were discussed and addressed.  No physical exam was performed (except for noted visual exam findings with Video Visits).   I connected with [name] by a video enabled telemedicine application and verified that I am speaking with the correct person using two identifiers. Location patient: home Location provider: work Persons participating in the virtual visit: patient, provider  The limitations, risks, security and privacy concerns of performing an evaluation and management service by video and the availability of in person appointments have been discussed.  The patient expressed understanding and agreed to proceed.   Reason for visit:  Hospital follow up.    HPI: Recently hospitalized for covid 03/18/19 at which time she experienced afib.  Discharged home on eliquis.  ECHO with normal EF.  Was seen [facility] ED on 04/04/19 with bradycardia, hypotensive and hyponatremia.  Was initially given atropine by EMTs.  Was observed overnight and treated with IVFs.  Sodium on recheck improved.  Saw Dr Saralyn Pilar 04/06/19.  72 hour holter monitor placed.  Since her discharge, she reports she si still fatigued.  She is eating.  Bowels not as bad since being on creon.  No significant diarrhea.  Some nausea.  Some increased nasal drainage.  Discussed using nasacort.  She has been clearing her throat and some cough.  Blood pressures doing well - averaging 126-130/65.  Off coreg.  Blood sugar doing well.  Has f/u with cardiology 04/19/19.     ROS: See pertinent positives and negatives per HPI.  Past Medical History:  Diagnosis Date  . Anemia   . Arthritis    . B12 deficiency   . Carpal tunnel syndrome, bilateral   . Diabetes mellitus ([place]) 02/17/2012  . Diabetes mellitus, type II ([place])   . Dyspnea   . Gastritis   . GERD (gastroesophageal reflux disease)   . Hypercholesterolemia   . Hypertension   . IBS (irritable bowel syndrome)   . Neuropathy    legs  . Pernicious anemia   . Vertigo    no episodes fo 9 or 10 yrs    Past Surgical History:  Procedure Laterality Date  . ANTERIOR VITRECTOMY Right 12/01/2017   Procedure: ANTERIOR VITRECTOMY;  Surgeon: Eulogio Bear, MD;  Location: [place];  Service: Ophthalmology;  Laterality: Right;  . BREAST BIOPSY Right 1980   neg  . BREAST SURGERY  1980   benign cyst removal  . CATARACT EXTRACTION W/PHACO Left 10/13/2017   Procedure: CATARACT EXTRACTION PHACO AND INTRAOCULAR LENS PLACEMENT ([place])  LEFT  DIABETES;  Surgeon: Eulogio Bear, MD;  Location: [place];  Service: Ophthalmology;  Laterality: Left;  Diabetic - insulin and oral meds  . CATARACT EXTRACTION W/PHACO Right 12/01/2017   Procedure: CATARACT EXTRACTION PHACO AND INTRAOCULAR LENS PLACEMENT ([place])  RIGHT DIABETIC;  Surgeon: Eulogio Bear, MD;  Location: [place];  Service: Ophthalmology;  Laterality: Right;  Diabetic - insulin and oral  . CHOLECYSTECTOMY  1983    Family History  Problem Relation Age of Onset  . Diabetes Father   . Kidney disease Father   . Heart disease Father   . Alcohol  abuse Mother   . Dementia Mother   . Diabetes Mother   . Diabetes Other   . Heart disease Son 27       CABG  . Hemophilia Brother   . Uterine cancer Sister   . Diabetes Sister   . COPD Sister   . Breast cancer Maternal Aunt 51  . Breast cancer Daughter 58  . Stroke Daughter     SOCIAL HX: reviewed.    Current Outpatient Medications:  .  acetaminophen (TYLENOL) 500 MG tablet, Take 500-1,000 mg by mouth every 6 (six) hours as needed for mild pain or moderate pain. , Disp: , Rfl:  .   amLODipine (NORVASC) 10 MG tablet, Take 1 tablet by mouth once daily (Patient taking differently: Take 10 mg by mouth daily. ), Disp: 90 tablet, Rfl: 0 .  apixaban (ELIQUIS) 5 MG TABS tablet, Take 1 tablet (5 mg total) by mouth 2 (two) times daily., Disp: 60 tablet, Rfl: 1 .  carvedilol (COREG) 25 MG tablet, Take 25 mg by mouth 2 (two) times daily with a meal., Disp: , Rfl:  .  fluticasone (CUTIVATE) 0.05 % cream, Apply 1 application topically as needed. Use as directed., Disp: , Rfl:  .  fluticasone furoate-vilanterol (BREO ELLIPTA) 200-25 MCG/INH AEPB, Inhale 1 puff into the lungs daily. (Patient not taking: Reported on 04/16/2019), Disp: 28 each, Rfl: 0 .  Fluticasone-Salmeterol (ADVAIR DISKUS) 250-50 MCG/DOSE AEPB, Inhale 1 puff into the lungs 2 (two) times daily. (Patient not taking: Reported on 04/05/2019), Disp: 60 each, Rfl: 3 .  hydrochlorothiazide (HYDRODIURIL) 25 MG tablet, Take 1 tablet (25 mg total) by mouth daily., Disp: 90 tablet, Rfl: 1 .  Insulin Glargine (BASAGLAR KWIKPEN) 100 UNIT/ML SOPN, Inject 0.28 mLs (28 Units total) into the skin every morning., Disp: , Rfl:  .  insulin lispro ([name]) 100 UNIT/ML injection, Inject 0.04-0.08 mLs (4-8 Units total) into the skin 3 (three) times daily with meals., Disp: , Rfl:  .  Insulin Pen Needle 32G X 4 MM MISC, Use to inject insulin three times daily, Disp: 300 each, Rfl: 3 .  lipase/protease/amylase ([name]) 36000 UNITS CPEP capsule, Take 81,856-31,497 Units by mouth See admin instructions. Take 2 capsules (72000 units) by mouth three times daily with meals and take 1 capsule (36000 units) by mouth daily with snacks, Disp: , Rfl:  .  lisinopril (ZESTRIL) 20 MG tablet, Take 1 tablet (20 mg total) by mouth 2 (two) times daily., Disp: , Rfl:  .  magnesium oxide (MAG-OX) 400 (241.3 Mg) MG tablet, Take 1 tablet by mouth once daily (Patient taking differently: Take 400 mg by mouth daily. ), Disp: 90 tablet, Rfl: 0 .  metFORMIN (GLUCOPHAGE-XR) 500 MG  24 hr tablet, Take 1 tablet (500 mg total) by mouth 2 (two) times daily. (Patient taking differently: Take 1,000 mg by mouth 2 (two) times daily. ), Disp: , Rfl:  .  ONETOUCH VERIO test strip, USE 1 STRIP TO CHECK GLUCOSE TWICE DAILY (Patient taking differently: 1 each by Other route 2 (two) times daily. ), Disp: 200 each, Rfl: 0 .  pantoprazole (PROTONIX) 40 MG tablet, Take 1 tablet (40 mg total) by mouth 2 (two) times daily., Disp: , Rfl:  .  pioglitazone (ACTOS) 15 MG tablet, Take 1 tablet by mouth once daily (Patient taking differently: Take 15 mg by mouth daily. ), Disp: 90 tablet, Rfl: 0 .  simvastatin (ZOCOR) 10 MG tablet, TAKE 1 TABLET BY MOUTH AT BEDTIME (Patient taking differently: Take 10  mg by mouth. ), Disp: 90 tablet, Rfl: 0 .  triamcinolone cream (KENALOG) 0.1 %, APPLY  CREAM TOPICALLY TWICE DAILY (Patient taking differently: Apply 1 application topically 2 (two) times daily. ), Disp: 15 g, Rfl: 0  EXAM:  VITALS per patient if applicable: [phone]  GENERAL: alert, oriented, appears well and in no acute distress  HEENT: atraumatic, conjunttiva clear, no obvious abnormalities on inspection of external nose and ears  NECK: normal movements of the head and neck  LUNGS: on inspection no signs of respiratory distress, breathing rate appears normal, no obvious gross SOB, gasping or wheezing  CV: no obvious cyanosis  PSYCH/NEURO: pleasant and cooperative, no obvious depression or anxiety, speech and thought processing grossly intact  ASSESSMENT AND PLAN:  Discussed the following assessment and plan:  AF (paroxysmal atrial fibrillation) (HCC) On eliquis.  Off coreg secondary to bradycardia.  Heart rate stable.  Has f/u with cardiology 04/19/19.    Aortic atherosclerosis ([place]) On simvastatin.   Bradycardia Off coreg.  Saw cardiology.  S/p holter.  Has f/u 04/19/19.    CKD (chronic kidney disease), stage III Avoid antiinflammatories.  Followed by nephrology.  Follow metabolic  panel.   COVID-19 virus infection Recently admitted with afib with RVR.  Tested positive for covid.  Received remdesivir and decadron.  Is feeling better.  Some nasal drainage some cough.  No chest pain or tightness.  No increased sob.  Robitussin and nasacort as directed.  Follow.    Dehydration Recently given IVFs.  Symptoms improved.  Follow.    Diabetes mellitus ([place]) Sugars as outlined.  Follow met b and a1c.    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  On creon.  Bowels doing better.    GERD (gastroesophageal reflux disease) Some nausea.  Eating.  Continue protonix.    Hyperlipidemia On simvastatin.  Follow lipid panel and liver function tests.    Hypertension Blood pressure under good control.  Continue same medication regimen.  Follow pressures.  Follow metabolic panel.    Hyponatremia After hydration.  Sodium improved.  Follow.   ILD (interstitial lung disease) ([place]) Breathing stable except for cough and some drainage.  Treat with robitussin and nasacort nasal spray as directed.  Follow.      I discussed the assessment and treatment plan with the patient. The patient was provided an opportunity to ask questions and all were answered. The patient agreed with the plan and demonstrated an understanding of the instructions.   The patient was advised to call back or seek an in-person evaluation if the symptoms worsen or if the condition fails to improve as anticipated.   Einar Pheasant, MD

## 2019-04-13 NOTE — Patient Outreach (Signed)
Outreach call to pt for telephone assessment/ 3rd attempt, pt answered the telephone and identified herself and then states she is getting another phone call and cannot talk right now and ask that RN CM call back at later date.  PLAN Outreach pt in 3-4 business days  Beth Tyler [facility], BSN [facility] Care Coordinator [phone]

## 2019-04-14 ENCOUNTER — Ambulatory Visit: Payer: Medicare HMO

## 2019-04-15 ENCOUNTER — Telehealth: Payer: Self-pay | Admitting: Internal Medicine

## 2019-04-15 NOTE — Telephone Encounter (Signed)
Patient has new onset a-fib.Confirmed doing ok and advised that she call her cardiologist for instructions on what she should do. Pt is going to f/u with them and let me know if she needs anything.

## 2019-04-15 NOTE — Telephone Encounter (Signed)
Pt called about wanting to know if she can take something for her fast heart beat when standing?  BP 129/64 rate sitting 74-85 standing 110-127. But when she sits it goes down. Please advise and Thank you!  Call pt @ [phone].

## 2019-04-16 ENCOUNTER — Encounter: Payer: Self-pay | Admitting: *Deleted

## 2019-04-16 ENCOUNTER — Other Ambulatory Visit: Payer: Self-pay | Admitting: *Deleted

## 2019-04-16 NOTE — Patient Outreach (Signed)
Outreach call/ 3rd attempt to pt for initial telephone assessment, Primary MD completes transition of care, spoke with pt, HIPAA verified, pt reports atrial fibrillation is her biggest challenge at present reporting " sometimes my heart rate goes up when I move around and I don't like it"  Pt states she recently wore heart monitor and mailed back and to follow up with MD within one week to discuss.  Pt reports she is still recovering from Covid and feels better "but it has been difficult"  Pt has son, daughter in law and 71 year old granddaughter living with her and they provide assistance as needed.  Pt states she still drives but not willing to drive to [facility] to A-fib clinic and not interested in this at present.  Pt feels she can manage well with her cardiologist.  Pt checks CBG TID and states " most all readings are around 100"  Pt states she is not concerned about blood sugar at present.  RN CM reviewed medications with pt, pt has inhalers but states she has not had to use them (they were prescribed because of Covid).  Pt states she has lost weight recently and feels she needed to lose the weight but cites issues with her pancreas not making enzymes (pt is on creon).  Pt reports she is attending all scheduled appointments.  Outpatient Encounter Medications as of 04/16/2019  Medication Sig Note  . acetaminophen (TYLENOL) 500 MG tablet Take 500-1,000 mg by mouth every 6 (six) hours as needed for mild pain or moderate pain.    Marland Kitchen amLODipine (NORVASC) 10 MG tablet Take 1 tablet by mouth once daily (Patient taking differently: Take 10 mg by mouth daily. )   . apixaban (ELIQUIS) 5 MG TABS tablet Take 1 tablet (5 mg total) by mouth 2 (two) times daily.   . fluticasone (CUTIVATE) 0.05 % cream Apply 1 application topically as needed. Use as directed.   . hydrochlorothiazide (HYDRODIURIL) 25 MG tablet Take 1 tablet (25 mg total) by mouth daily.   . Insulin Glargine (BASAGLAR KWIKPEN) 100 UNIT/ML SOPN Inject  0.28 mLs (28 Units total) into the skin every morning.   . insulin lispro ([name]) 100 UNIT/ML injection Inject 0.04-0.08 mLs (4-8 Units total) into the skin 3 (three) times daily with meals.   . Insulin Pen Needle 32G X 4 MM MISC Use to inject insulin three times daily   . lipase/protease/amylase ([name]) 36000 UNITS CPEP capsule Take 36,000-72,000 Units by mouth See admin instructions. Take 2 capsules (72000 units) by mouth three times daily with meals and take 1 capsule (36000 units) by mouth daily with snacks   . lisinopril (ZESTRIL) 20 MG tablet Take 1 tablet (20 mg total) by mouth 2 (two) times daily. 04/05/2019: Not yet started  . magnesium oxide (MAG-OX) 400 (241.3 Mg) MG tablet Take 1 tablet by mouth once daily (Patient taking differently: Take 400 mg by mouth daily. )   . metFORMIN (GLUCOPHAGE-XR) 500 MG 24 hr tablet Take 1 tablet (500 mg total) by mouth 2 (two) times daily. (Patient taking differently: Take 1,000 mg by mouth 2 (two) times daily. ) 04/05/2019: Not yet started  . ONETOUCH VERIO test strip USE 1 STRIP TO CHECK GLUCOSE TWICE DAILY (Patient taking differently: 1 each by Other route 2 (two) times daily. )   . pantoprazole (PROTONIX) 40 MG tablet Take 1 tablet (40 mg total) by mouth 2 (two) times daily.   . pioglitazone (ACTOS) 15 MG tablet Take 1 tablet by mouth  once daily (Patient taking differently: Take 15 mg by mouth daily. )   . simvastatin (ZOCOR) 10 MG tablet TAKE 1 TABLET BY MOUTH AT BEDTIME (Patient taking differently: Take 10 mg by mouth. )   . triamcinolone cream (KENALOG) 0.1 % APPLY  CREAM TOPICALLY TWICE DAILY (Patient taking differently: Apply 1 application topically 2 (two) times daily. )   . carvedilol (COREG) 25 MG tablet Take 25 mg by mouth 2 (two) times daily with a meal.   . fluticasone furoate-vilanterol (BREO ELLIPTA) 200-25 MCG/INH AEPB Inhale 1 puff into the lungs daily. (Patient not taking: Reported on 04/16/2019)   . Fluticasone-Salmeterol (ADVAIR DISKUS)  250-50 MCG/DOSE AEPB Inhale 1 puff into the lungs 2 (two) times daily. (Patient not taking: Reported on 04/05/2019)   . ondansetron (ZOFRAN ODT) 4 MG disintegrating tablet Take 1 tablet (4 mg total) by mouth 2 (two) times daily as needed for nausea or vomiting. (Patient not taking: Reported on 04/16/2019)    No facility-administered encounter medications on file as of 04/16/2019.    THN CM Care Plan Problem One     Most Recent Value  Care Plan Problem One  Knowledge deficit related to Atrial Fibrillation  Role Documenting the Problem One  Care Management Coordinator  Care Plan for Problem One  Active  THN Long Term Goal   Pt will successfully manage atrial fibrillation (symptom management, action plan) within 60 days  THN Long Term Goal Start Date  04/16/19  Interventions for Problem One Long Term Goal  RNCM established plan of care with pt, reviewed resources such as A-fib clinic in [facility], mailed successful outreach letter to pt home including 24 hour nurse line magnet, consent form, pamphlet. Faxed barrier letter and today's note to primary MD.  [facility] CM Short Term Goal #1   Pt will verbalize/ adhere to action plan for Atrial Fibrillation within 30 days  THN CM Short Term Goal #1 Start Date  04/16/19  Interventions for Short Term Goal #1  RN CM reviewed definition of A-fib, signs/ symptoms of exacerbation, importance of keeping in touch with MD about symptom management and caling 911 for chest pain,unresolved dyspnea or sustained elevated HR, pt verbalizes understanding, reviewed importance of taking medications as prescribed and reviewed purpose of medications in treating A-fib.    THN CM Care Plan Problem Two     Most Recent Value  Care Plan Problem Two  Knowledge deficit related to diabetes  Role Documenting the Problem Two  Care Management Coordinator  Care Plan for Problem Two  Active  THN CM Short Term Goal #1   Pt will verbalize understanding of/ utilize plate method within 30  days.  THN CM Short Term Goal #1 Start Date  04/16/19  Interventions for Short Term Goal #2   RN CM reviewed plate method and examples of nutritious food choices with carbohdrate modified diet      PLAN Outreach pt for telephone assessment next month  Irving Shows [facility], BSN [facility] Care Coordinator [phone]

## 2019-04-18 ENCOUNTER — Encounter: Payer: Self-pay | Admitting: Internal Medicine

## 2019-04-18 ENCOUNTER — Other Ambulatory Visit: Payer: Self-pay

## 2019-04-18 DIAGNOSIS — Z7901 Long term (current) use of anticoagulants: Secondary | ICD-10-CM | POA: Diagnosis not present

## 2019-04-18 DIAGNOSIS — I129 Hypertensive chronic kidney disease with stage 1 through stage 4 chronic kidney disease, or unspecified chronic kidney disease: Secondary | ICD-10-CM | POA: Insufficient documentation

## 2019-04-18 DIAGNOSIS — Z79899 Other long term (current) drug therapy: Secondary | ICD-10-CM | POA: Insufficient documentation

## 2019-04-18 DIAGNOSIS — E1122 Type 2 diabetes mellitus with diabetic chronic kidney disease: Secondary | ICD-10-CM | POA: Insufficient documentation

## 2019-04-18 DIAGNOSIS — R42 Dizziness and giddiness: Secondary | ICD-10-CM | POA: Insufficient documentation

## 2019-04-18 DIAGNOSIS — Z794 Long term (current) use of insulin: Secondary | ICD-10-CM | POA: Insufficient documentation

## 2019-04-18 DIAGNOSIS — I480 Paroxysmal atrial fibrillation: Secondary | ICD-10-CM | POA: Insufficient documentation

## 2019-04-18 DIAGNOSIS — N183 Chronic kidney disease, stage 3 unspecified: Secondary | ICD-10-CM | POA: Diagnosis not present

## 2019-04-18 DIAGNOSIS — R55 Syncope and collapse: Secondary | ICD-10-CM | POA: Diagnosis not present

## 2019-04-18 DIAGNOSIS — R0902 Hypoxemia: Secondary | ICD-10-CM | POA: Diagnosis not present

## 2019-04-18 NOTE — Assessment & Plan Note (Signed)
Recently given IVFs.  Symptoms improved.  Follow.

## 2019-04-18 NOTE — Assessment & Plan Note (Signed)
Some nausea.  Eating.  Continue protonix.

## 2019-04-18 NOTE — Assessment & Plan Note (Signed)
Off coreg.  Saw cardiology.  S/p holter.  Has f/u 04/19/19.

## 2019-04-18 NOTE — Assessment & Plan Note (Signed)
On eliquis.  Off coreg secondary to bradycardia.  Heart rate stable.  Has f/u with cardiology 04/19/19.

## 2019-04-18 NOTE — ED Triage Notes (Signed)
Patient to ED via EMS for feeling faint. History of low sodium (40). Patient is alert and able to answer questions. EMS states she "got scared and wanted to come back and have her sodium checked again.

## 2019-04-18 NOTE — Assessment & Plan Note (Signed)
After hydration.  Sodium improved.  Follow.

## 2019-04-18 NOTE — Assessment & Plan Note (Signed)
Avoid antiinflammatories.  Followed by nephrology.  Follow metabolic panel.  

## 2019-04-18 NOTE — Assessment & Plan Note (Signed)
On simvastatin.  Follow lipid panel and liver function tests.   

## 2019-04-18 NOTE — ED Triage Notes (Signed)
EMS vital signs BP 128/79, HR 82, 98%, T 98.5, CBG 130.

## 2019-04-18 NOTE — Assessment & Plan Note (Signed)
Blood pressure under good control.  Continue same medication regimen.  Follow pressures.  Follow metabolic panel.   

## 2019-04-18 NOTE — Assessment & Plan Note (Signed)
Being followed by GI.  On creon.  Bowels doing better.

## 2019-04-18 NOTE — Assessment & Plan Note (Signed)
Recently admitted with afib with RVR.  Tested positive for covid.  Received remdesivir and decadron.  Is feeling better.  Some nasal drainage some cough.  No chest pain or tightness.  No increased sob.  Robitussin and nasacort as directed.  Follow.

## 2019-04-18 NOTE — Assessment & Plan Note (Signed)
Sugars as outlined.  Follow met b and a1c.  

## 2019-04-18 NOTE — Assessment & Plan Note (Signed)
On simvastatin.   

## 2019-04-18 NOTE — Assessment & Plan Note (Signed)
Breathing stable except for cough and some drainage.  Treat with robitussin and nasacort nasal spray as directed.  Follow.

## 2019-04-19 ENCOUNTER — Emergency Department: Payer: Medicare HMO

## 2019-04-19 ENCOUNTER — Other Ambulatory Visit: Payer: Self-pay | Admitting: Pharmacy Technician

## 2019-04-19 ENCOUNTER — Emergency Department
Admission: EM | Admit: 2019-04-19 | Discharge: 2019-04-19 | Disposition: A | Discharge status: Home / Self Care | Payer: Medicare HMO | Attending: Emergency Medicine | Admitting: Emergency Medicine

## 2019-04-19 DIAGNOSIS — E1169 Type 2 diabetes mellitus with other specified complication: Secondary | ICD-10-CM | POA: Diagnosis not present

## 2019-04-19 DIAGNOSIS — R42 Dizziness and giddiness: Secondary | ICD-10-CM

## 2019-04-19 DIAGNOSIS — I10 Essential (primary) hypertension: Secondary | ICD-10-CM | POA: Diagnosis not present

## 2019-04-19 DIAGNOSIS — I480 Paroxysmal atrial fibrillation: Secondary | ICD-10-CM | POA: Diagnosis not present

## 2019-04-19 DIAGNOSIS — R0602 Shortness of breath: Secondary | ICD-10-CM | POA: Diagnosis not present

## 2019-04-19 LAB — URINALYSIS, COMPLETE (UACMP) WITH MICROSCOPIC
Bacteria, UA: NONE SEEN
Bilirubin Urine: NEGATIVE
Glucose, UA: NEGATIVE mg/dL
Hgb urine dipstick: NEGATIVE
Ketones, ur: NEGATIVE mg/dL
Leukocytes,Ua: NEGATIVE
Nitrite: NEGATIVE
Protein, ur: NEGATIVE mg/dL
Specific Gravity, Urine: 1.008 (ref 1.005–1.030)
pH: 5 (ref 5.0–8.0)

## 2019-04-19 LAB — CBC
HCT: 35.9 % — ABNORMAL LOW (ref 36.0–46.0)
Hemoglobin: 11.9 g/dL — ABNORMAL LOW (ref 12.0–15.0)
MCH: 29.7 pg (ref 26.0–34.0)
MCHC: 33.1 g/dL (ref 30.0–36.0)
MCV: 89.5 fL (ref 80.0–100.0)
Platelets: 350 10*3/uL (ref 150–400)
RBC: 4.01 MIL/uL (ref 3.87–5.11)
RDW: 13.7 % (ref 11.5–15.5)
WBC: 10.2 10*3/uL (ref 4.0–10.5)
nRBC: 0 % (ref 0.0–0.2)

## 2019-04-19 LAB — COMPREHENSIVE METABOLIC PANEL
ALT: 24 U/L (ref 0–44)
AST: 23 U/L (ref 15–41)
Albumin: 3.9 g/dL (ref 3.5–5.0)
Alkaline Phosphatase: 71 U/L (ref 38–126)
Anion gap: 14 (ref 5–15)
BUN: 18 mg/dL (ref 8–23)
CO2: 23 mmol/L (ref 22–32)
Calcium: 9 mg/dL (ref 8.9–10.3)
Chloride: 94 mmol/L — ABNORMAL LOW (ref 98–111)
Creatinine, Ser: 1.2 mg/dL — ABNORMAL HIGH (ref 0.44–1.00)
GFR calc Af Amer: 52 mL/min — ABNORMAL LOW (ref >60)
GFR calc non Af Amer: 44 mL/min — ABNORMAL LOW (ref >60)
Glucose, Bld: 108 mg/dL — ABNORMAL HIGH (ref 70–99)
Potassium: 4.6 mmol/L (ref 3.5–5.1)
Sodium: 131 mmol/L — ABNORMAL LOW (ref 135–145)
Total Bilirubin: 0.6 mg/dL (ref 0.3–1.2)
Total Protein: 7.5 g/dL (ref 6.5–8.1)

## 2019-04-19 LAB — TROPONIN I (HIGH SENSITIVITY)
Troponin I (High Sensitivity): 11 ng/L (ref <18)
Troponin I (High Sensitivity): 13 ng/L (ref <18)

## 2019-04-19 NOTE — Patient Outreach (Signed)
[facility]) Care Management  04/19/2019  Beth Tyler Nov 10, 1945 [id number]     Unsuccessful call placed to patient regarding patient assistance application(s) for [name] and Basaglar with [name] , HIPAA compliant voicemail left.  Was calling patient to inquire if she has received the application that was mailed to her on 04/07/2019. Unfortunately patient did not answer the phone but a voicemail was left per HIPAA guidelines.  Follow up:  Will follow up with 2nd outreach attempt in 3-7 business days if call is not returned.  [name] P. Castor Gittleman, CPhT [facility] Care Management [phone]

## 2019-04-19 NOTE — ED Notes (Signed)
Pt returned to stretcher with staff. Provided for comfort and safety. Will continue to assess.

## 2019-04-19 NOTE — ED Notes (Addendum)
Pt ambulatory from restroom with one person assist needed. Gait slow and slightly unsteady. Pt alert and calm at this time. Was unable to collect urine sample due to not having a cup to collect urine, but verbalizes understanding need for sample. Pt denies any "new pain" currently.

## 2019-04-19 NOTE — ED Notes (Addendum)
Pt up to restroom with 1 person assist. Gait slow but steady. Pt denies pain. Urine collected.

## 2019-04-19 NOTE — Patient Outreach (Signed)
[facility]) Care Management  04/19/2019  Beth Tyler 1946-03-31 [id number]  ADDENDUM  Incoming call received from patient in regards to [name] application for [name] and Basaglar.  Spoke to patient, HIPAA identifiers verified.  Patient informed she has not received the application that was mailed to her on 04/07/2019. Informed patient I would prepare another application for her to sign and mail it to her when I am back in the office. Patient was agreeable to this plan.  Will follow up with patient in 5-10 business days to inquire if she received the 2nd application that was mailed to her.  Marston Mccadden P. Pamula Luther, CPhT [facility] Care Management [phone]

## 2019-04-19 NOTE — ED Notes (Signed)
ED Provider at bedside. 

## 2019-04-19 NOTE — ED Notes (Signed)
Preparing for discharge.

## 2019-04-19 NOTE — ED Provider Notes (Signed)
[facility] Emergency Department Provider Note  ____________________________________________   First MD Initiated Contact with Patient 04/19/19 0222     (approximate)  I have reviewed the triage vital signs and the nursing notes.   HISTORY  Chief Complaint Feeling faint   HPI Beth Tyler is a 74 y.o. female with below list of previous medical conditions presents to the emergency department secondary to episode tonight where the patient states that she felt as if she was going to faint.  Patient states this is occurred in the past when her sodium was low and as such she wanted to have it checked.  Patient denies any complaints at present.  Patient states during the episode she did not have any headache no weakness numbness gait instability or visual changes.  Patient denies any nausea no vomiting or diarrhea.  Patient denies any chest pain or shortness of breath.  Patient denies any complaints at present.        Past Medical History:  Diagnosis Date  . Anemia   . Arthritis   . B12 deficiency   . Carpal tunnel syndrome, bilateral   . Diabetes mellitus (HCC) 02/17/2012  . Diabetes mellitus, type II (HCC)   . Dyspnea   . Gastritis   . GERD (gastroesophageal reflux disease)   . Hypercholesterolemia   . Hypertension   . IBS (irritable bowel syndrome)   . Neuropathy    legs  . Pernicious anemia   . Vertigo    no episodes fo 9 or 10 yrs    Patient Active Problem List   Diagnosis Date Noted  . AF (paroxysmal atrial fibrillation) (HCC) 04/04/2019  . Dehydration 04/04/2019  . Hyponatremia 04/04/2019  . Bradycardia 04/04/2019  . COVID-19 virus infection 04/03/2019  . New onset atrial fibrillation (HCC) 03/18/2019  . Controlled type 2 diabetes mellitus with hyperglycemia (HCC) 03/18/2019  . Chronic stasis dermatitis 03/18/2019  . Diabetes-pancreatic exocrine dysfunction syndrome (HCC) 03/18/2019  . ILD (interstitial lung disease) (HCC)  03/18/2019  . CKD (chronic kidney disease), stage III 08/08/2018  . GERD (gastroesophageal reflux disease) 08/08/2018  . Snoring 05/24/2018  . Arm numbness 07/06/2017  . Otitis externa 07/06/2017  . Aortic atherosclerosis (HCC) 10/22/2016  . Nausea 10/09/2016  . Lymphedema 03/11/2016  . SOB (shortness of breath) 02/25/2016  . Pain in limb 02/02/2016  . Back pain 05/23/2015  . Cough 02/13/2015  . BMI 40.0-44.9, adult (HCC) 08/10/2014  . Bilateral leg edema 08/10/2014  . Chest pain 07/17/2014  . Health care maintenance 07/17/2014  . Colon cancer screening 01/19/2014  . Stress 06/14/2013  . Chronic hyperkalemia 03/21/2013  . IBS (irritable bowel syndrome) 02/17/2012  . Hypertension 02/17/2012  . Hyperlipidemia 02/17/2012  . Diabetes mellitus (HCC) 02/17/2012  . B12 deficiency 02/17/2012    Past Surgical History:  Procedure Laterality Date  . ANTERIOR VITRECTOMY Right 12/01/2017   Procedure: ANTERIOR VITRECTOMY;  Surgeon: Nevada Crane, MD;  Location: [facility];  Service: Ophthalmology;  Laterality: Right;  . BREAST BIOPSY Right 1980   neg  . BREAST SURGERY  1980   benign cyst removal  . CATARACT EXTRACTION W/PHACO Left 10/13/2017   Procedure: CATARACT EXTRACTION PHACO AND INTRAOCULAR LENS PLACEMENT (IOC)  LEFT  DIABETES;  Surgeon: Nevada Crane, MD;  Location: [facility];  Service: Ophthalmology;  Laterality: Left;  Diabetic - insulin and oral meds  . CATARACT EXTRACTION W/PHACO Right 12/01/2017   Procedure: CATARACT EXTRACTION PHACO AND INTRAOCULAR LENS PLACEMENT (IOC)  RIGHT  DIABETIC;  Surgeon: Eulogio Bear, MD;  Location: [place];  Service: Ophthalmology;  Laterality: Right;  Diabetic - insulin and oral  . CHOLECYSTECTOMY  1983    Prior to Admission medications   Medication Sig Start Date End Date Taking? Authorizing Provider  acetaminophen (TYLENOL) 500 MG tablet Take 500-1,000 mg by mouth every 6 (six) hours as needed for mild  pain or moderate pain.     [provider]  amLODipine (NORVASC) 10 MG tablet Take 1 tablet by mouth once daily Patient taking differently: Take 10 mg by mouth daily.  03/17/19   Einar Pheasant, MD  apixaban (ELIQUIS) 5 MG TABS tablet Take 1 tablet (5 mg total) by mouth 2 (two) times daily. 03/22/19   Lorella Nimrod, MD  carvedilol (COREG) 25 MG tablet Take 25 mg by mouth 2 (two) times daily with a meal.    [provider]  fluticasone (CUTIVATE) 0.05 % cream Apply 1 application topically as needed. Use as directed. 04/05/19   Enzo Bi, MD  fluticasone furoate-vilanterol (BREO ELLIPTA) 200-25 MCG/INH AEPB Inhale 1 puff into the lungs daily. Patient not taking: Reported on 04/16/2019 01/01/18   Laverle Hobby, MD  Fluticasone-Salmeterol (ADVAIR DISKUS) 250-50 MCG/DOSE AEPB Inhale 1 puff into the lungs 2 (two) times daily. Patient not taking: Reported on 04/05/2019 10/29/17   Laverle Hobby, MD  hydrochlorothiazide (HYDRODIURIL) 25 MG tablet Take 1 tablet (25 mg total) by mouth daily. 08/04/18   Einar Pheasant, MD  Insulin Glargine (BASAGLAR KWIKPEN) 100 UNIT/ML SOPN Inject 0.28 mLs (28 Units total) into the skin every morning. 04/05/19   Enzo Bi, MD  insulin lispro ([name]) 100 UNIT/ML injection Inject 0.04-0.08 mLs (4-8 Units total) into the skin 3 (three) times daily with meals. 04/05/19   Enzo Bi, MD  Insulin Pen Needle 32G X 4 MM MISC Use to inject insulin three times daily 07/20/18   Einar Pheasant, MD  lipase/protease/amylase ([name]) 36000 UNITS CPEP capsule Take 82,505-39,767 Units by mouth See admin instructions. Take 2 capsules (72000 units) by mouth three times daily with meals and take 1 capsule (36000 units) by mouth daily with snacks 02/08/19 02/08/20  [provider]  lisinopril (ZESTRIL) 20 MG tablet Take 1 tablet (20 mg total) by mouth 2 (two) times daily. 04/05/19   Enzo Bi, MD  magnesium oxide (MAG-OX) 400 (241.3 Mg) MG tablet Take 1 tablet  by mouth once daily Patient taking differently: Take 400 mg by mouth daily.  03/17/19   Einar Pheasant, MD  metFORMIN (GLUCOPHAGE-XR) 500 MG 24 hr tablet Take 1 tablet (500 mg total) by mouth 2 (two) times daily. Patient taking differently: Take 1,000 mg by mouth 2 (two) times daily.  04/05/19   Enzo Bi, MD  [facility] VERIO test strip USE 1 STRIP TO CHECK GLUCOSE TWICE DAILY Patient taking differently: 1 each by Other route 2 (two) times daily.  01/18/19   Einar Pheasant, MD  pantoprazole (PROTONIX) 40 MG tablet Take 1 tablet (40 mg total) by mouth 2 (two) times daily. 04/05/19   Enzo Bi, MD  pioglitazone (ACTOS) 15 MG tablet Take 1 tablet by mouth once daily Patient taking differently: Take 15 mg by mouth daily.  02/23/19   Einar Pheasant, MD  simvastatin (ZOCOR) 10 MG tablet TAKE 1 TABLET BY MOUTH AT BEDTIME Patient taking differently: Take 10 mg by mouth.  03/17/19   Einar Pheasant, MD  triamcinolone cream (KENALOG) 0.1 % APPLY  CREAM TOPICALLY TWICE DAILY Patient taking differently: Apply 1 application  topically 2 (two) times daily.  03/17/19   Dale [place], MD    Allergies Hydrocodone-acetaminophen, Percocet [oxycodone-acetaminophen], Demeclocycline, Penicillins, and Tetracyclines & related  Family History  Problem Relation Age of Onset  . Diabetes Father   . Kidney disease Father   . Heart disease Father   . Alcohol abuse Mother   . Dementia Mother   . Diabetes Mother   . Diabetes Other   . Heart disease Son 2       CABG  . Hemophilia Brother   . Uterine cancer Sister   . Diabetes Sister   . COPD Sister   . Breast cancer Maternal Aunt 51  . Breast cancer Daughter 70  . Stroke Daughter     Social History Social History   Tobacco Use  . Smoking status: Never Smoker  . Smokeless tobacco: Never Used  Substance Use Topics  . Alcohol use: No    Alcohol/week: 0.0 standard drinks  . Drug use: No    Review of Systems Constitutional: No fever/chills Eyes: No  visual changes. ENT: No sore throat. Cardiovascular: Denies chest pain. Respiratory: Denies shortness of breath. Gastrointestinal: No abdominal pain.  No nausea, no vomiting.  No diarrhea.  No constipation. Genitourinary: Negative for dysuria. Musculoskeletal: Negative for neck pain.  Negative for back pain. Integumentary: Negative for rash. Neurological: Negative for headaches, focal weakness or numbness.  Positive for "feeling faint"  ____________________________________________   PHYSICAL EXAM:  VITAL SIGNS: ED Triage Vitals  Enc Vitals Group     BP 04/18/19 2346 (!) 169/56     Pulse Rate 04/18/19 2346 88     Resp 04/18/19 2346 20     Temp 04/18/19 2346 98.6 F (37 C)     Temp Source 04/18/19 2346 Oral     SpO2 04/18/19 2346 99 %     Weight 04/18/19 2348 92.5 kg (204 lb)     Height 04/18/19 2348 1.575 m (5\' 2" )     Head Circumference --      Peak Flow --      Pain Score 04/18/19 2348 0     Pain Loc --      Pain Edu? --      Excl. in GC? --     Constitutional: Alert and oriented.  Eyes: Conjunctivae are normal.  Mouth/Throat: Patient is wearing a mask. Neck: No stridor.  No meningeal signs.   Cardiovascular: Normal rate, regular rhythm. Good peripheral circulation. Grossly normal heart sounds. Respiratory: Normal respiratory effort.  No retractions. Gastrointestinal: Soft and nontender. No distention.  Musculoskeletal: No lower extremity tenderness nor edema. No gross deformities of extremities. Neurologic:  Normal speech and language. No gross focal neurologic deficits are appreciated.  Skin:  Skin is warm, dry and intact. Psychiatric: Mood and affect are normal. Speech and behavior are normal.  ____________________________________________   LABS (all labs ordered are listed, but only abnormal results are displayed)  Labs Reviewed  CBC - Abnormal; Notable for the following components:      Result Value   Hemoglobin 11.9 (*)    HCT 35.9 (*)    All other  components within normal limits  COMPREHENSIVE METABOLIC PANEL - Abnormal; Notable for the following components:   Sodium 131 (*)    Chloride 94 (*)    Glucose, Bld 108 (*)    Creatinine, Ser 1.20 (*)    GFR calc non Af Amer 44 (*)    GFR calc Af Amer 52 (*)    All other components  within normal limits  URINALYSIS, COMPLETE (UACMP) WITH MICROSCOPIC - Abnormal; Notable for the following components:   Color, Urine STRAW (*)    APPearance CLEAR (*)    All other components within normal limits  TROPONIN I (HIGH SENSITIVITY)  TROPONIN I (HIGH SENSITIVITY)   ____________________________________________  EKG  ED ECG REPORT I, [place], the attending physician, personally viewed and interpreted this ECG.   Date: 04/19/2019  EKG Time: 6:11 AM  Rate: 84  Rhythm: Normal sinus rhythm  Axis: Normal  Intervals: Normal  ST&T Change: None ____________________________________  RADIOLOGY I, [place], personally viewed and evaluated these images (plain radiographs) as part of my medical decision making, as well as reviewing the written report by the radiologist.  ED MD interpretation: No acute intracranial abnormality noted on CT head per radiologist.  Official radiology report(s): CT Head Wo Contrast  Result Date: 04/19/2019 CLINICAL DATA:  Dizziness EXAM: CT HEAD WITHOUT CONTRAST TECHNIQUE: Contiguous axial images were obtained from the base of the skull through the vertex without intravenous contrast. COMPARISON:  March 31, 2007 FINDINGS: Brain: No evidence of acute territorial infarction, hemorrhage, hydrocephalus,extra-axial collection or mass lesion/mass effect. There is dilatation the ventricles and sulci consistent with age-related atrophy. Low-attenuation changes in the deep white matter consistent with small vessel ischemia. Vascular: No hyperdense vessel or unexpected calcification. Skull: The skull is intact. No fracture or focal lesion identified. Sinuses/Orbits:  The visualized paranasal sinuses and mastoid air cells are clear. The orbits and globes intact. Other: None IMPRESSION: No acute intracranial abnormality. Findings consistent with age related atrophy and chronic small vessel ischemia Electronically Signed   By: Jonna Clark M.D.   On: 04/19/2019 04:18     Procedures   ____________________________________________   INITIAL IMPRESSION / MDM / ASSESSMENT AND PLAN / ED COURSE  As part of my medical decision making, I reviewed the following data within the electronic MEDICAL RECORD NUMBER   74 year old female presented with above-stated history and physical exam secondary to episode of "feeling faint".  Laboratory data unremarkable including sodium which is 131.  CT head also revealed no acute intracranial abnormality.  Urinalysis negative.  Patient asymptomatic at present.  Will have patient follow-up with primary care provider for further outpatient evaluation.      ____________________________________________  FINAL CLINICAL IMPRESSION(S) / ED DIAGNOSES  Final diagnoses:  Feeling faint     MEDICATIONS GIVEN DURING THIS VISIT:  Medications - No data to display   ED Discharge Orders    None      *Please note:  Beth Tyler was evaluated in Emergency Department on 04/19/2019 for the symptoms described in the history of present illness. She was evaluated in the context of the global COVID-19 pandemic, which necessitated consideration that the patient might be at risk for infection with the SARS-CoV-2 virus that causes COVID-19. Institutional protocols and algorithms that pertain to the evaluation of patients at risk for COVID-19 are in a state of rapid change based on information released by regulatory bodies including the CDC and federal and state organizations. These policies and algorithms were followed during the patient's care in the ED.  Some ED evaluations and interventions may be delayed as a result of limited staffing during  the pandemic.*  Note:  This document was prepared using Dragon voice recognition software and may include unintentional dictation errors.   Darci Current, MD 04/19/19 [phone]

## 2019-04-20 ENCOUNTER — Ambulatory Visit: Payer: Medicare HMO

## 2019-04-27 ENCOUNTER — Other Ambulatory Visit: Payer: Self-pay

## 2019-04-27 ENCOUNTER — Inpatient Hospital Stay
Admission: EM | Admit: 2019-04-27 | Discharge: 2019-05-01 | DRG: 641 | Disposition: A | Discharge status: Home / Self Care | Payer: Medicare HMO | Attending: Internal Medicine | Admitting: Internal Medicine

## 2019-04-27 ENCOUNTER — Encounter: Payer: Self-pay | Admitting: Emergency Medicine

## 2019-04-27 DIAGNOSIS — E1122 Type 2 diabetes mellitus with diabetic chronic kidney disease: Secondary | ICD-10-CM | POA: Diagnosis not present

## 2019-04-27 DIAGNOSIS — N183 Chronic kidney disease, stage 3 unspecified: Secondary | ICD-10-CM | POA: Diagnosis present

## 2019-04-27 DIAGNOSIS — I129 Hypertensive chronic kidney disease with stage 1 through stage 4 chronic kidney disease, or unspecified chronic kidney disease: Secondary | ICD-10-CM | POA: Diagnosis present

## 2019-04-27 DIAGNOSIS — E1140 Type 2 diabetes mellitus with diabetic neuropathy, unspecified: Secondary | ICD-10-CM | POA: Diagnosis present

## 2019-04-27 DIAGNOSIS — E869 Volume depletion, unspecified: Secondary | ICD-10-CM | POA: Diagnosis present

## 2019-04-27 DIAGNOSIS — E871 Hypo-osmolality and hyponatremia: Secondary | ICD-10-CM | POA: Diagnosis not present

## 2019-04-27 DIAGNOSIS — Z7901 Long term (current) use of anticoagulants: Secondary | ICD-10-CM

## 2019-04-27 DIAGNOSIS — K8689 Other specified diseases of pancreas: Secondary | ICD-10-CM

## 2019-04-27 DIAGNOSIS — Z6837 Body mass index (BMI) 37.0-37.9, adult: Secondary | ICD-10-CM

## 2019-04-27 DIAGNOSIS — E785 Hyperlipidemia, unspecified: Secondary | ICD-10-CM | POA: Diagnosis present

## 2019-04-27 DIAGNOSIS — Z8616 Personal history of COVID-19: Secondary | ICD-10-CM

## 2019-04-27 DIAGNOSIS — Z7951 Long term (current) use of inhaled steroids: Secondary | ICD-10-CM | POA: Diagnosis not present

## 2019-04-27 DIAGNOSIS — R197 Diarrhea, unspecified: Secondary | ICD-10-CM | POA: Diagnosis not present

## 2019-04-27 DIAGNOSIS — M1990 Unspecified osteoarthritis, unspecified site: Secondary | ICD-10-CM | POA: Diagnosis present

## 2019-04-27 DIAGNOSIS — Z833 Family history of diabetes mellitus: Secondary | ICD-10-CM | POA: Diagnosis not present

## 2019-04-27 DIAGNOSIS — Z811 Family history of alcohol abuse and dependence: Secondary | ICD-10-CM | POA: Diagnosis not present

## 2019-04-27 DIAGNOSIS — E861 Hypovolemia: Secondary | ICD-10-CM | POA: Diagnosis present

## 2019-04-27 DIAGNOSIS — I480 Paroxysmal atrial fibrillation: Secondary | ICD-10-CM | POA: Diagnosis present

## 2019-04-27 DIAGNOSIS — K861 Other chronic pancreatitis: Secondary | ICD-10-CM | POA: Diagnosis present

## 2019-04-27 DIAGNOSIS — Z825 Family history of asthma and other chronic lower respiratory diseases: Secondary | ICD-10-CM

## 2019-04-27 DIAGNOSIS — E7800 Pure hypercholesterolemia, unspecified: Secondary | ICD-10-CM | POA: Diagnosis not present

## 2019-04-27 DIAGNOSIS — K219 Gastro-esophageal reflux disease without esophagitis: Secondary | ICD-10-CM | POA: Diagnosis not present

## 2019-04-27 DIAGNOSIS — K580 Irritable bowel syndrome with diarrhea: Secondary | ICD-10-CM | POA: Diagnosis not present

## 2019-04-27 DIAGNOSIS — T502X5A Adverse effect of carbonic-anhydrase inhibitors, benzothiadiazides and other diuretics, initial encounter: Secondary | ICD-10-CM | POA: Diagnosis present

## 2019-04-27 DIAGNOSIS — I10 Essential (primary) hypertension: Secondary | ICD-10-CM | POA: Diagnosis not present

## 2019-04-27 DIAGNOSIS — Z8249 Family history of ischemic heart disease and other diseases of the circulatory system: Secondary | ICD-10-CM

## 2019-04-27 DIAGNOSIS — U071 COVID-19: Secondary | ICD-10-CM | POA: Diagnosis not present

## 2019-04-27 DIAGNOSIS — Z841 Family history of disorders of kidney and ureter: Secondary | ICD-10-CM

## 2019-04-27 DIAGNOSIS — Z803 Family history of malignant neoplasm of breast: Secondary | ICD-10-CM

## 2019-04-27 DIAGNOSIS — R195 Other fecal abnormalities: Secondary | ICD-10-CM

## 2019-04-27 DIAGNOSIS — Z823 Family history of stroke: Secondary | ICD-10-CM

## 2019-04-27 DIAGNOSIS — N189 Chronic kidney disease, unspecified: Secondary | ICD-10-CM | POA: Diagnosis not present

## 2019-04-27 DIAGNOSIS — Z794 Long term (current) use of insulin: Secondary | ICD-10-CM

## 2019-04-27 DIAGNOSIS — Z8049 Family history of malignant neoplasm of other genital organs: Secondary | ICD-10-CM

## 2019-04-27 LAB — URINALYSIS, COMPLETE (UACMP) WITH MICROSCOPIC
Bacteria, UA: NONE SEEN
Bilirubin Urine: NEGATIVE
Glucose, UA: NEGATIVE mg/dL
Hgb urine dipstick: NEGATIVE
Ketones, ur: NEGATIVE mg/dL
Leukocytes,Ua: NEGATIVE
Nitrite: NEGATIVE
Protein, ur: NEGATIVE mg/dL
Specific Gravity, Urine: 1.008 (ref 1.005–1.030)
Squamous Epithelial / HPF: NONE SEEN (ref 0–5)
pH: 6 (ref 5.0–8.0)

## 2019-04-27 LAB — LIPASE, BLOOD: Lipase: 22 U/L (ref 11–51)

## 2019-04-27 LAB — COMPREHENSIVE METABOLIC PANEL
ALT: 19 U/L (ref 0–44)
AST: 22 U/L (ref 15–41)
Albumin: 3.9 g/dL (ref 3.5–5.0)
Alkaline Phosphatase: 60 U/L (ref 38–126)
Anion gap: 13 (ref 5–15)
BUN: 10 mg/dL (ref 8–23)
CO2: 24 mmol/L (ref 22–32)
Calcium: 9 mg/dL (ref 8.9–10.3)
Chloride: 79 mmol/L — ABNORMAL LOW (ref 98–111)
Creatinine, Ser: 0.87 mg/dL (ref 0.44–1.00)
GFR calc Af Amer: >60 mL/min (ref >60)
GFR calc non Af Amer: >60 mL/min (ref >60)
Glucose, Bld: 172 mg/dL — ABNORMAL HIGH (ref 70–99)
Potassium: 4.4 mmol/L (ref 3.5–5.1)
Sodium: 116 mmol/L — CL (ref 135–145)
Total Bilirubin: 1.1 mg/dL (ref 0.3–1.2)
Total Protein: 7.1 g/dL (ref 6.5–8.1)

## 2019-04-27 LAB — GLUCOSE, CAPILLARY: Glucose-Capillary: 134 mg/dL — ABNORMAL HIGH (ref 70–99)

## 2019-04-27 LAB — TSH: TSH: 1.514 u[IU]/mL (ref 0.350–4.500)

## 2019-04-27 LAB — CBC
HCT: 32.5 % — ABNORMAL LOW (ref 36.0–46.0)
Hemoglobin: 11.5 g/dL — ABNORMAL LOW (ref 12.0–15.0)
MCH: 29.4 pg (ref 26.0–34.0)
MCHC: 35.4 g/dL (ref 30.0–36.0)
MCV: 83.1 fL (ref 80.0–100.0)
Platelets: 327 10*3/uL (ref 150–400)
RBC: 3.91 MIL/uL (ref 3.87–5.11)
RDW: 13 % (ref 11.5–15.5)
WBC: 9.6 10*3/uL (ref 4.0–10.5)
nRBC: 0 % (ref 0.0–0.2)

## 2019-04-27 LAB — OSMOLALITY: Osmolality: 250 mOsm/kg — ABNORMAL LOW (ref 275–295)

## 2019-04-27 MED ORDER — INSULIN ASPART 100 UNIT/ML ~~LOC~~ SOLN
0.0000 [IU] | Freq: Three times a day (TID) | SUBCUTANEOUS | Status: DC
  Administered 2019-04-27: 1 [IU] via SUBCUTANEOUS
  Administered 2019-04-28 (×2): 2 [IU] via SUBCUTANEOUS
  Administered 2019-04-28: 1 [IU] via SUBCUTANEOUS
  Administered 2019-04-29 (×3): 2 [IU] via SUBCUTANEOUS
  Administered 2019-04-30: 3 [IU] via SUBCUTANEOUS
  Administered 2019-04-30: 1 [IU] via SUBCUTANEOUS
  Administered 2019-04-30: 2 [IU] via SUBCUTANEOUS
  Administered 2019-04-30 – 2019-05-01 (×2): 1 [IU] via SUBCUTANEOUS
  Filled 2019-04-27 (×12): qty 1

## 2019-04-27 MED ORDER — PANCRELIPASE (LIP-PROT-AMYL) 12000-38000 UNITS PO CPEP
72000.0000 [IU] | ORAL_CAPSULE | Freq: Three times a day (TID) | ORAL | Status: DC
  Administered 2019-04-28 – 2019-05-01 (×11): 72000 [IU] via ORAL
  Filled 2019-04-27 (×12): qty 6

## 2019-04-27 MED ORDER — TRIAMCINOLONE ACETONIDE 0.1 % EX CREA
1.0000 "application " | TOPICAL_CREAM | Freq: Two times a day (BID) | CUTANEOUS | Status: DC
  Administered 2019-04-30 – 2019-05-01 (×2): 1 via TOPICAL
  Filled 2019-04-27: qty 15

## 2019-04-27 MED ORDER — AMLODIPINE BESYLATE 10 MG PO TABS
10.0000 mg | ORAL_TABLET | Freq: Every day | ORAL | Status: DC
  Administered 2019-04-28: 10 mg via ORAL
  Filled 2019-04-27: qty 2
  Filled 2019-04-27: qty 1

## 2019-04-27 MED ORDER — ONDANSETRON HCL 4 MG/2ML IJ SOLN
4.0000 mg | Freq: Once | INTRAMUSCULAR | Status: AC
  Administered 2019-04-27: 4 mg via INTRAVENOUS
  Filled 2019-04-27: qty 2

## 2019-04-27 MED ORDER — ACETAMINOPHEN 650 MG RE SUPP: 650.0000 mg | Freq: Four times a day (QID) | RECTAL | Status: DC | PRN

## 2019-04-27 MED ORDER — ONDANSETRON HCL 4 MG PO TABS: 4.0000 mg | ORAL_TABLET | Freq: Four times a day (QID) | ORAL | Status: DC | PRN

## 2019-04-27 MED ORDER — HYDROMORPHONE HCL 1 MG/ML IJ SOLN: 1.0000 mg | INTRAMUSCULAR | Status: DC | PRN

## 2019-04-27 MED ORDER — APIXABAN 5 MG PO TABS
5.0000 mg | ORAL_TABLET | Freq: Two times a day (BID) | ORAL | Status: DC
  Administered 2019-04-27 – 2019-05-01 (×8): 5 mg via ORAL
  Filled 2019-04-27 (×8): qty 1

## 2019-04-27 MED ORDER — ONDANSETRON HCL 4 MG/2ML IJ SOLN
4.0000 mg | Freq: Four times a day (QID) | INTRAMUSCULAR | Status: DC | PRN
  Administered 2019-04-28 – 2019-04-29 (×3): 4 mg via INTRAVENOUS
  Filled 2019-04-27 (×3): qty 2

## 2019-04-27 MED ORDER — SODIUM CHLORIDE 0.9 % IV BOLUS
250.0000 mL | Freq: Once | INTRAVENOUS | Status: AC
  Administered 2019-04-27: 250 mL via INTRAVENOUS

## 2019-04-27 MED ORDER — MAGNESIUM HYDROXIDE 400 MG/5ML PO SUSP: 30.0000 mL | Freq: Every day | ORAL | Status: DC | PRN

## 2019-04-27 MED ORDER — METOPROLOL TARTRATE 25 MG PO TABS
25.0000 mg | ORAL_TABLET | Freq: Two times a day (BID) | ORAL | Status: DC
  Administered 2019-04-27 – 2019-05-01 (×7): 25 mg via ORAL
  Filled 2019-04-27 (×8): qty 1

## 2019-04-27 MED ORDER — PIOGLITAZONE HCL 15 MG PO TABS
15.0000 mg | ORAL_TABLET | Freq: Every day | ORAL | Status: DC
  Administered 2019-04-28 – 2019-05-01 (×4): 15 mg via ORAL
  Filled 2019-04-27 (×4): qty 1

## 2019-04-27 MED ORDER — ACETAMINOPHEN 325 MG PO TABS
650.0000 mg | ORAL_TABLET | Freq: Four times a day (QID) | ORAL | Status: DC | PRN
  Administered 2019-04-30: 650 mg via ORAL
  Filled 2019-04-27: qty 2

## 2019-04-27 MED ORDER — TRAZODONE HCL 50 MG PO TABS: 25.0000 mg | ORAL_TABLET | Freq: Every evening | ORAL | Status: DC | PRN

## 2019-04-27 MED ORDER — ENOXAPARIN SODIUM 40 MG/0.4ML ~~LOC~~ SOLN: 40.0000 mg | SUBCUTANEOUS | Status: DC

## 2019-04-27 MED ORDER — SIMVASTATIN 10 MG PO TABS
10.0000 mg | ORAL_TABLET | Freq: Every day | ORAL | Status: DC
  Administered 2019-04-27 – 2019-04-30 (×4): 10 mg via ORAL
  Filled 2019-04-27 (×5): qty 1

## 2019-04-27 MED ORDER — LISINOPRIL 20 MG PO TABS
20.0000 mg | ORAL_TABLET | Freq: Two times a day (BID) | ORAL | Status: DC
  Administered 2019-04-27 – 2019-04-30 (×6): 20 mg via ORAL
  Filled 2019-04-27: qty 2
  Filled 2019-04-27 (×5): qty 1
  Filled 2019-04-27: qty 2
  Filled 2019-04-27: qty 1

## 2019-04-27 MED ORDER — ONDANSETRON 4 MG PO TBDP
4.0000 mg | ORAL_TABLET | Freq: Once | ORAL | Status: AC | PRN
  Administered 2019-04-27: 4 mg via ORAL
  Filled 2019-04-27: qty 1

## 2019-04-27 MED ORDER — SODIUM CHLORIDE 0.45 % IV SOLN: INTRAVENOUS | Status: DC

## 2019-04-27 MED ORDER — PANTOPRAZOLE SODIUM 20 MG PO TBEC
20.0000 mg | DELAYED_RELEASE_TABLET | Freq: Two times a day (BID) | ORAL | Status: DC
  Administered 2019-04-28 – 2019-05-01 (×8): 20 mg via ORAL
  Filled 2019-04-27 (×9): qty 1

## 2019-04-27 MED ORDER — FLUTICASONE FUROATE-VILANTEROL 200-25 MCG/INH IN AEPB
1.0000 | INHALATION_SPRAY | Freq: Every day | RESPIRATORY_TRACT | Status: DC
  Administered 2019-04-30: 1 via RESPIRATORY_TRACT
  Filled 2019-04-27: qty 28

## 2019-04-27 NOTE — ED Notes (Signed)
Report given to Katie, RN.

## 2019-04-27 NOTE — ED Triage Notes (Signed)
Pt in via POV, reports LUQ abdominal pain since Saturday w/ nausea and diarrhea, denies emesis.  Advised per GI to be evaluated here. NAD noted at this time.

## 2019-04-27 NOTE — H&P (Signed)
[place] at [facility]   PATIENT NAME: Beth Tyler    MR#:  [id number]  DATE OF BIRTH:  03-21-1946  DATE OF ADMISSION:  04/27/2019  PRIMARY CARE PHYSICIAN: Dale [place], MD   REQUESTING/REFERRING PHYSICIAN: Gertie Gowda, MD CHIEF COMPLAINT:   Chief Complaint  Patient presents with  . Abdominal Pain    HISTORY OF PRESENT ILLNESS:  Beth Tyler  is a 74 y.o. Caucasian female with a known history of multiple medical problems that will be mentioned below including type diabetes mellitus, GERD, dyslipidemia, hypertension and pancreatic insufficiency March, who presented to the emergency room with acute onset of recurrent diarrhea on Saturday and Sunday as well as crampy upper abdominal pain to when she eats anything.  She has been feeling fatigued since Thursday and does feel dehydrated..  She has been drinking 4 bottles of water lately.  She has not been having much appetite due to associated nausea without vomiting.  She was admitted recently about a month ago for COVID-19 and had atrial fibrillation with RVR.  She was placed on p.o. Eliquis then.  Upon presentation to the emergency room, blood pressure was 175/59 with otherwise normal vital signs.  Labs revealed hyponatremia with a sodium of 116 and hypochloremia with a chloride of 79.  Blood glucose was 172 with a CO2 of 24 and anion gap of 13.  Serum lipase was 22.  CBC showed mild anemia with hemoglobin of 11.5 and hematocrit of 32.5.  Urinalysis showed specific gravity 1008 and was otherwise unremarkable. EKG showed normal sinus rhythm with rate of 61.  The patient was given 4 mg of IV Zofran as well as 4 mg p.o. and 2050 mL IV normal saline.  Dr. Thedore Mins was consulted over the phone and recommended continuing hydration with half-normal saline at 50 mL/h with a target goal of increasing sodium by 8 points per 24 hours as possible.  The patient will be admitted to a medical bed for further evaluation and management.     PAST MEDICAL HISTORY:   Past Medical History:  Diagnosis Date  . Anemia   . Arthritis   . B12 deficiency   . Carpal tunnel syndrome, bilateral   . Diabetes mellitus (HCC) 02/17/2012  . Diabetes mellitus, type II (HCC)   . Dyspnea   . Gastritis   . GERD (gastroesophageal reflux disease)   . Hypercholesterolemia   . Hypertension   . IBS (irritable bowel syndrome)   . Neuropathy    legs  . Pernicious anemia   . Vertigo    no episodes fo 9 or 10 yrs    PAST SURGICAL HISTORY:   Past Surgical History:  Procedure Laterality Date  . ANTERIOR VITRECTOMY Right 12/01/2017   Procedure: ANTERIOR VITRECTOMY;  Surgeon: Nevada Crane, MD;  Location: [facility];  Service: Ophthalmology;  Laterality: Right;  . BREAST BIOPSY Right 1980   neg  . BREAST SURGERY  1980   benign cyst removal  . CATARACT EXTRACTION W/PHACO Left 10/13/2017   Procedure: CATARACT EXTRACTION PHACO AND INTRAOCULAR LENS PLACEMENT (IOC)  LEFT  DIABETES;  Surgeon: Nevada Crane, MD;  Location: [facility];  Service: Ophthalmology;  Laterality: Left;  Diabetic - insulin and oral meds  . CATARACT EXTRACTION W/PHACO Right 12/01/2017   Procedure: CATARACT EXTRACTION PHACO AND INTRAOCULAR LENS PLACEMENT (IOC)  RIGHT DIABETIC;  Surgeon: Nevada Crane, MD;  Location: [facility];  Service: Ophthalmology;  Laterality: Right;  Diabetic - insulin and oral  .  CHOLECYSTECTOMY  1983    SOCIAL HISTORY:   Social History   Tobacco Use  . Smoking status: Never Smoker  . Smokeless tobacco: Never Used  Substance Use Topics  . Alcohol use: No    Alcohol/week: 0.0 standard drinks    FAMILY HISTORY:   Family History  Problem Relation Age of Onset  . Diabetes Father   . Kidney disease Father   . Heart disease Father   . Alcohol abuse Mother   . Dementia Mother   . Diabetes Mother   . Diabetes Other   . Heart disease Son 22       CABG  . Hemophilia Brother   . Uterine cancer Sister   .  Diabetes Sister   . COPD Sister   . Breast cancer Maternal Aunt 51  . Breast cancer Daughter 71  . Stroke Daughter     DRUG ALLERGIES:   Allergies  Allergen Reactions  . Hydrocodone-Acetaminophen Anaphylaxis  . Percocet [Oxycodone-Acetaminophen] Anaphylaxis  . Demeclocycline Nausea Only  . Penicillins Nausea Only    Did it involve swelling of the face/tongue/throat, SOB, or low BP? No Did it involve sudden or severe rash/hives, skin peeling, or any reaction on the inside of your mouth or nose? No Did you need to seek medical attention at a hospital or doctor's office? No When did it last happen? If all above answers are "NO", may proceed with cephalosporin use.  . Tetracyclines & Related Nausea Only    REVIEW OF SYSTEMS:   ROS As per history of present illness. All pertinent systems were reviewed above. Constitutional,  HEENT, cardiovascular, respiratory, GI, GU, musculoskeletal, neuro, psychiatric, endocrine,  integumentary and hematologic systems were reviewed and are otherwise  negative/unremarkable except for positive findings mentioned above in the HPI.   MEDICATIONS AT HOME:   Prior to Admission medications   Medication Sig Start Date End Date Taking? Authorizing Provider  acetaminophen (TYLENOL) 500 MG tablet Take 500-1,000 mg by mouth every 6 (six) hours as needed for mild pain or moderate pain.     [provider]  amLODipine (NORVASC) 10 MG tablet Take 1 tablet by mouth once daily Patient taking differently: Take 10 mg by mouth daily.  03/17/19   Dale [place], MD  apixaban (ELIQUIS) 5 MG TABS tablet Take 1 tablet (5 mg total) by mouth 2 (two) times daily. 03/22/19   Arnetha Courser, MD  carvedilol (COREG) 25 MG tablet Take 25 mg by mouth 2 (two) times daily with a meal.    [provider]  fluticasone (CUTIVATE) 0.05 % cream Apply 1 application topically as needed. Use as directed. 04/05/19   Darlin Priestly, MD  fluticasone furoate-vilanterol  (BREO ELLIPTA) 200-25 MCG/INH AEPB Inhale 1 puff into the lungs daily. Patient not taking: Reported on 04/16/2019 01/01/18   Shane Crutch, MD  Fluticasone-Salmeterol (ADVAIR DISKUS) 250-50 MCG/DOSE AEPB Inhale 1 puff into the lungs 2 (two) times daily. Patient not taking: Reported on 04/05/2019 10/29/17   Shane Crutch, MD  hydrochlorothiazide (HYDRODIURIL) 25 MG tablet Take 1 tablet (25 mg total) by mouth daily. 08/04/18   Dale [place], MD  Insulin Glargine (BASAGLAR KWIKPEN) 100 UNIT/ML SOPN Inject 0.28 mLs (28 Units total) into the skin every morning. 04/05/19   Darlin Priestly, MD  insulin lispro ([name]) 100 UNIT/ML injection Inject 0.04-0.08 mLs (4-8 Units total) into the skin 3 (three) times daily with meals. 04/05/19   Darlin Priestly, MD  Insulin Pen Needle 32G X 4 MM MISC Use to inject  insulin three times daily 07/20/18   Einar Pheasant, MD  lipase/protease/amylase ([name]) 36000 UNITS CPEP capsule Take 27,782-42,353 Units by mouth See admin instructions. Take 2 capsules (72000 units) by mouth three times daily with meals and take 1 capsule (36000 units) by mouth daily with snacks 02/08/19 02/08/20  [provider]  lisinopril (ZESTRIL) 20 MG tablet Take 1 tablet (20 mg total) by mouth 2 (two) times daily. 04/05/19   Enzo Bi, MD  magnesium oxide (MAG-OX) 400 (241.3 Mg) MG tablet Take 1 tablet by mouth once daily Patient taking differently: Take 400 mg by mouth daily.  03/17/19   Einar Pheasant, MD  metFORMIN (GLUCOPHAGE-XR) 500 MG 24 hr tablet Take 1 tablet (500 mg total) by mouth 2 (two) times daily. Patient taking differently: Take 1,000 mg by mouth 2 (two) times daily.  04/05/19   Enzo Bi, MD  [facility] VERIO test strip USE 1 STRIP TO CHECK GLUCOSE TWICE DAILY Patient taking differently: 1 each by Other route 2 (two) times daily.  01/18/19   Einar Pheasant, MD  pantoprazole (PROTONIX) 40 MG tablet Take 1 tablet (40 mg total) by mouth 2 (two) times daily. 04/05/19   Enzo Bi, MD  pioglitazone (ACTOS) 15 MG tablet Take 1 tablet by mouth once daily Patient taking differently: Take 15 mg by mouth daily.  02/23/19   Einar Pheasant, MD  simvastatin (ZOCOR) 10 MG tablet TAKE 1 TABLET BY MOUTH AT BEDTIME Patient taking differently: Take 10 mg by mouth.  03/17/19   Einar Pheasant, MD  triamcinolone cream (KENALOG) 0.1 % APPLY  CREAM TOPICALLY TWICE DAILY Patient taking differently: Apply 1 application topically 2 (two) times daily.  03/17/19   Einar Pheasant, MD      VITAL SIGNS:  Blood pressure (!) 162/72, pulse 65, temperature 98.2 F (36.8 C), temperature source Oral, resp. rate 18, height 5\' 2"  (1.575 m), weight 93 kg, SpO2 98 %.  PHYSICAL EXAMINATION:  Physical Exam  GENERAL:  74 y.o.-year-old Caucasian female patient lying in the bed with no acute distress.  EYES: Pupils equal, round, reactive to light and accommodation. No scleral icterus. Extraocular muscles intact.  HEENT: Head atraumatic, normocephalic. Oropharynx and nasopharynx clear.  NECK:  Supple, no jugular venous distention. No thyroid enlargement, no tenderness.  LUNGS: Normal breath sounds bilaterally, no wheezing, rales,rhonchi or crepitation. No use of accessory muscles of respiration.  CARDIOVASCULAR: Regular rate and rhythm, S1, S2 normal. No murmurs, rubs, or gallops.  ABDOMEN: Soft, nondistended, nontender. Bowel sounds present. No organomegaly or mass.  EXTREMITIES: No pedal edema, cyanosis, or clubbing.  NEUROLOGIC: Cranial nerves II through XII are intact. Muscle strength 5/5 in all extremities. Sensation intact. Gait not checked.  PSYCHIATRIC: The patient is alert and oriented x 3.  Normal affect and good eye contact. SKIN: No obvious rash, lesion, or ulcer.   LABORATORY PANEL:   CBC Recent Labs  Lab 04/27/19 1603  WBC 9.6  HGB 11.5*  HCT 32.5*  PLT 327    ------------------------------------------------------------------------------------------------------------------  Chemistries  Recent Labs  Lab 04/27/19 1603  NA 116*  K 4.4  CL 79*  CO2 24  GLUCOSE 172*  BUN 10  CREATININE 0.87  CALCIUM 9.0  AST 22  ALT 19  ALKPHOS 60  BILITOT 1.1   ------------------------------------------------------------------------------------------------------------------  Cardiac Enzymes No results for input(s): TROPONINI in the last 168 hours. ------------------------------------------------------------------------------------------------------------------  RADIOLOGY:  No results found.    IMPRESSION AND PLAN:   1.  Severe hyponatremia.   -This is likely hypovolemic  due to recurrent diarrhea and partly due to excessive amount of water intake together with HCTZ intake.   -The patient will be admitted to a medical bed.   -We will follow serial sodium levels while hydrating with half-normal saline 60 mL/h with a target goal of increasing sodium by 8 points per 24 hours as much as possible.   -Will obtain hyponatremia work-up including urine and serum osmolality, TSH as well as urine lytes. -We will stop HCTZ specially given pancreatic insufficiency/chronic pancreatitis and hyponatremia. I believe the patient should not be on it anymore.  2.  Hypertension.   -We will continue amlodipine, Lopressor and Zestril.  3.  Type 2 diabetes mellitus.   -Patient will be placed on supplemental coverage with NovoLog we will continue her basal coverage.  -We will continue p.o. Actos.   -Metformin will be held off.  4.  Paroxysmal atrial fibrillation.   -We will continue Eliquis and Lopressor.  5.  Dyslipidemia.   -We will continue statin therapy.  6.  DVT prophylaxis.   -Subcutaneous Lovenox   All the records are reviewed and case discussed with ED provider. The plan of care was discussed in details with the patient (and family). I answered all  questions. The patient agreed to proceed with the above mentioned plan. Further management will depend upon hospital course.   CODE STATUS: Full code  TOTAL TIME TAKING CARE OF THIS PATIENT: 55 minutes.    Hannah Beat M.D on 04/27/2019 at 7:53 PM  [facility]   From 7 PM-7 AM, contact night-coverage [email or web address]  CC: Primary care physician; Dale Spicer, MD   Note: This dictation was prepared with Dragon dictation along with smaller phrase technology. Any transcriptional errors that result from this process are unintentional.

## 2019-04-27 NOTE — ED Provider Notes (Signed)
[facility] Emergency Department Provider Note   ____________________________________________   First MD Initiated Contact with Patient 04/27/19 1751     (approximate)  I have reviewed the triage vital signs and the nursing notes.   HISTORY  Chief Complaint Abdominal Pain    HPI Beth Tyler is a 74 y.o. female history of diabetes, recent Covid for which she has been cleared, diabetes, pancreatic insufficiency atrial fibrillation  Patient comes today reports since about Saturday or Sunday night she been having very loose stools.  When she eats anything she will get crampy upper abdominal pain that is currently gone    Feels fatigued, generally thirsty.  Does feel dehydrated.  Ports she is only been able to drink about 4 cups of water yesterday and not having much appetite with some mild nausea  Past Medical History:  Diagnosis Date  . Anemia   . Arthritis   . B12 deficiency   . Carpal tunnel syndrome, bilateral   . Diabetes mellitus (HCC) 02/17/2012  . Diabetes mellitus, type II (HCC)   . Dyspnea   . Gastritis   . GERD (gastroesophageal reflux disease)   . Hypercholesterolemia   . Hypertension   . IBS (irritable bowel syndrome)   . Neuropathy    legs  . Pernicious anemia   . Vertigo    no episodes fo 9 or 10 yrs    Patient Active Problem List   Diagnosis Date Noted  . AF (paroxysmal atrial fibrillation) (HCC) 04/04/2019  . Dehydration 04/04/2019  . Hyponatremia 04/04/2019  . Bradycardia 04/04/2019  . COVID-19 virus infection 04/03/2019  . New onset atrial fibrillation (HCC) 03/18/2019  . Controlled type 2 diabetes mellitus with hyperglycemia (HCC) 03/18/2019  . Chronic stasis dermatitis 03/18/2019  . Diabetes-pancreatic exocrine dysfunction syndrome (HCC) 03/18/2019  . ILD (interstitial lung disease) (HCC) 03/18/2019  . CKD (chronic kidney disease), stage III 08/08/2018  . GERD (gastroesophageal reflux disease) 08/08/2018  .  Snoring 05/24/2018  . Arm numbness 07/06/2017  . Otitis externa 07/06/2017  . Aortic atherosclerosis (HCC) 10/22/2016  . Nausea 10/09/2016  . Lymphedema 03/11/2016  . SOB (shortness of breath) 02/25/2016  . Pain in limb 02/02/2016  . Back pain 05/23/2015  . Cough 02/13/2015  . BMI 40.0-44.9, adult (HCC) 08/10/2014  . Bilateral leg edema 08/10/2014  . Chest pain 07/17/2014  . Health care maintenance 07/17/2014  . Colon cancer screening 01/19/2014  . Stress 06/14/2013  . Chronic hyperkalemia 03/21/2013  . IBS (irritable bowel syndrome) 02/17/2012  . Hypertension 02/17/2012  . Hyperlipidemia 02/17/2012  . Diabetes mellitus (HCC) 02/17/2012  . B12 deficiency 02/17/2012    Past Surgical History:  Procedure Laterality Date  . ANTERIOR VITRECTOMY Right 12/01/2017   Procedure: ANTERIOR VITRECTOMY;  Surgeon: Nevada Crane, MD;  Location: [facility];  Service: Ophthalmology;  Laterality: Right;  . BREAST BIOPSY Right 1980   neg  . BREAST SURGERY  1980   benign cyst removal  . CATARACT EXTRACTION W/PHACO Left 10/13/2017   Procedure: CATARACT EXTRACTION PHACO AND INTRAOCULAR LENS PLACEMENT (IOC)  LEFT  DIABETES;  Surgeon: Nevada Crane, MD;  Location: [facility];  Service: Ophthalmology;  Laterality: Left;  Diabetic - insulin and oral meds  . CATARACT EXTRACTION W/PHACO Right 12/01/2017   Procedure: CATARACT EXTRACTION PHACO AND INTRAOCULAR LENS PLACEMENT (IOC)  RIGHT DIABETIC;  Surgeon: Nevada Crane, MD;  Location: [facility];  Service: Ophthalmology;  Laterality: Right;  Diabetic - insulin and oral  .  CHOLECYSTECTOMY  1983    Prior to Admission medications   Medication Sig Start Date End Date Taking? Authorizing Provider  acetaminophen (TYLENOL) 500 MG tablet Take 500-1,000 mg by mouth every 6 (six) hours as needed for mild pain or moderate pain.     [provider]  amLODipine (NORVASC) 10 MG tablet Take 1 tablet by mouth once  daily Patient taking differently: Take 10 mg by mouth daily.  03/17/19   Dale [place], MD  apixaban (ELIQUIS) 5 MG TABS tablet Take 1 tablet (5 mg total) by mouth 2 (two) times daily. 03/22/19   Arnetha Courser, MD  carvedilol (COREG) 25 MG tablet Take 25 mg by mouth 2 (two) times daily with a meal.    [provider]  fluticasone (CUTIVATE) 0.05 % cream Apply 1 application topically as needed. Use as directed. 04/05/19   Darlin Priestly, MD  fluticasone furoate-vilanterol (BREO ELLIPTA) 200-25 MCG/INH AEPB Inhale 1 puff into the lungs daily. Patient not taking: Reported on 04/16/2019 01/01/18   Shane Crutch, MD  Fluticasone-Salmeterol (ADVAIR DISKUS) 250-50 MCG/DOSE AEPB Inhale 1 puff into the lungs 2 (two) times daily. Patient not taking: Reported on 04/05/2019 10/29/17   Shane Crutch, MD  hydrochlorothiazide (HYDRODIURIL) 25 MG tablet Take 1 tablet (25 mg total) by mouth daily. 08/04/18   Dale [place], MD  Insulin Glargine (BASAGLAR KWIKPEN) 100 UNIT/ML SOPN Inject 0.28 mLs (28 Units total) into the skin every morning. 04/05/19   Darlin Priestly, MD  insulin lispro ([name]) 100 UNIT/ML injection Inject 0.04-0.08 mLs (4-8 Units total) into the skin 3 (three) times daily with meals. 04/05/19   Darlin Priestly, MD  Insulin Pen Needle 32G X 4 MM MISC Use to inject insulin three times daily 07/20/18   Dale [place], MD  lipase/protease/amylase ([name]) 36000 UNITS CPEP capsule Take 36,000-72,000 Units by mouth See admin instructions. Take 2 capsules (72000 units) by mouth three times daily with meals and take 1 capsule (36000 units) by mouth daily with snacks 02/08/19 02/08/20  [provider]  lisinopril (ZESTRIL) 20 MG tablet Take 1 tablet (20 mg total) by mouth 2 (two) times daily. 04/05/19   Darlin Priestly, MD  magnesium oxide (MAG-OX) 400 (241.3 Mg) MG tablet Take 1 tablet by mouth once daily Patient taking differently: Take 400 mg by mouth daily.  03/17/19   Dale [place], MD   metFORMIN (GLUCOPHAGE-XR) 500 MG 24 hr tablet Take 1 tablet (500 mg total) by mouth 2 (two) times daily. Patient taking differently: Take 1,000 mg by mouth 2 (two) times daily.  04/05/19   Darlin Priestly, MD  [facility] VERIO test strip USE 1 STRIP TO CHECK GLUCOSE TWICE DAILY Patient taking differently: 1 each by Other route 2 (two) times daily.  01/18/19   Dale [place], MD  pantoprazole (PROTONIX) 40 MG tablet Take 1 tablet (40 mg total) by mouth 2 (two) times daily. 04/05/19   Darlin Priestly, MD  pioglitazone (ACTOS) 15 MG tablet Take 1 tablet by mouth once daily Patient taking differently: Take 15 mg by mouth daily.  02/23/19   Dale [place], MD  simvastatin (ZOCOR) 10 MG tablet TAKE 1 TABLET BY MOUTH AT BEDTIME Patient taking differently: Take 10 mg by mouth.  03/17/19   Dale [place], MD  triamcinolone cream (KENALOG) 0.1 % APPLY  CREAM TOPICALLY TWICE DAILY Patient taking differently: Apply 1 application topically 2 (two) times daily.  03/17/19   Dale [place], MD    Allergies Hydrocodone-acetaminophen, Percocet [oxycodone-acetaminophen], Demeclocycline, Penicillins, and Tetracyclines & related  Family History  Problem Relation Age of Onset  . Diabetes Father   . Kidney disease Father   . Heart disease Father   . Alcohol abuse Mother   . Dementia Mother   . Diabetes Mother   . Diabetes Other   . Heart disease Son 14       CABG  . Hemophilia Brother   . Uterine cancer Sister   . Diabetes Sister   . COPD Sister   . Breast cancer Maternal Aunt 51  . Breast cancer Daughter 65  . Stroke Daughter     Social History Social History   Tobacco Use  . Smoking status: Never Smoker  . Smokeless tobacco: Never Used  Substance Use Topics  . Alcohol use: No    Alcohol/week: 0.0 standard drinks  . Drug use: No    Review of Systems Constitutional: No fever/chills head Covid about a month ago cleared by health department Eyes: No visual changes. ENT: No sore throat.   Thirsty Cardiovascular: Denies chest pain. Respiratory: Denies shortness of breath. Gastrointestinal: No abdominal pain at present, will get some crampy abdominal pain upper abdomen after eating, reports she has had similar symptoms in the past and a CT scan on her last hospital visit with those symptoms recently.   Musculoskeletal: Negative for back pain. Skin: Negative for rash. Neurological: Negative for headaches, areas of focal weakness or numbness.    ____________________________________________   PHYSICAL EXAM:  VITAL SIGNS: ED Triage Vitals [04/27/19 1555]  Enc Vitals Group     BP (!) 175/59     Pulse Rate 61     Resp 16     Temp 98.2 F (36.8 C)     Temp Source Oral     SpO2 98 %     Weight 205 lb (93 kg)     Height 5\' 2"  (1.575 m)     Head Circumference      Peak Flow      Pain Score 4     Pain Loc      Pain Edu?      Excl. in [place]?     Constitutional: Alert and oriented.  Mild appearance of chronic illness.  No acute distress very pleasant. Eyes: Conjunctivae are normal. Head: Atraumatic. Nose: No congestion/rhinnorhea. Mouth/Throat: Mucous membranes are somewhat dry. Neck: No stridor.  Cardiovascular: Normal rate, regular rhythm. Grossly normal heart sounds.  Good peripheral circulation. Respiratory: Normal respiratory effort.  No retractions. Lungs CTAB. Gastrointestinal: Soft and nontender. No distention.  No pain or discomfort in any quadrant to palpation at this time.  Reports she has mild nausea presently Musculoskeletal: No lower extremity tenderness nor edema.  Mild generalized fatigue. Neurologic:  Normal speech and language. No gross focal neurologic deficits are appreciated.  Skin:  Skin is warm, dry and intact. No rash noted. Psychiatric: Mood and affect are normal. Speech and behavior are normal.  No confusion or alteration in mental status  ____________________________________________   LABS (all labs ordered are listed, but only abnormal  results are displayed)  Labs Reviewed  COMPREHENSIVE METABOLIC PANEL - Abnormal; Notable for the following components:      Result Value   Sodium 116 (*)    Chloride 79 (*)    Glucose, Bld 172 (*)    All other components within normal limits  CBC - Abnormal; Notable for the following components:   Hemoglobin 11.5 (*)    HCT 32.5 (*)    All other components within normal limits  URINALYSIS, COMPLETE (UACMP) WITH MICROSCOPIC - Abnormal; Notable for the following components:   Color, Urine YELLOW (*)    APPearance CLEAR (*)    All other components within normal limits  LIPASE, BLOOD   ____________________________________________  EKG  ED ECG REPORT I, Sharyn Creamer, the attending physician, personally viewed and interpreted this ECG.  Date: 04/27/2019 EKG Time: 1610 Rate: 60 Rhythm: normal sinus rhythm QRS Axis: normal Intervals: normal ST/T Wave abnormalities: normal Narrative Interpretation: no evidence of acute ischemia  ____________________________________________  RADIOLOGY  No results found.  Reviewed previous CT scan of the abdomen pelvis when she had similar symptoms in the past with hyponatremia and does not appear to have acute pathology at that time.  Patient currently denying abdominal pain, reports will intermittently, associated with eating, I do not see indication for emergent repeat CT imaging at this time ____________________________________________   PROCEDURES  Procedure(s) performed: None  Procedures  Critical Care performed: Yes, see critical care note(s)  CRITICAL CARE Performed by: Sharyn Creamer   Total critical care time:   30 minutes  Critical care time was exclusive of separately billable procedures and treating other patients.  Critical care was necessary to treat or prevent imminent or life-threatening deterioration.  Critical care was time spent personally by me on the following activities: development of treatment plan with patient  and/or surrogate as well as nursing, discussions with consultants, evaluation of patient's response to treatment, examination of patient, obtaining history from patient or surrogate, ordering and performing treatments and interventions, ordering and review of laboratory studies, ordering and review of radiographic studies, pulse oximetry and re-evaluation of patient's condition.  ____________________________________________   INITIAL IMPRESSION / ASSESSMENT AND PLAN / ED COURSE  Pertinent labs & imaging results that were available during my care of the patient were reviewed by me and considered in my medical decision making (see chart for details).   Patient presents for fatigue, feeling dehydrated loose stools.  Appears to be somewhat recurrent without associate abdominal pain at this time except in a postprandial nature in the left upper quadrant which she reports is chronic she has known pancreatic insufficiency on [name].  Reassuring exam at this time except she does appear hypovolemic consistent with her clinical history as well.  She has severe hyponatremia today but no acute alteration of mental status, but her sodium well below 120.  Discussed with Dr. Thedore Mins of nephrology who recommends starting small to 250 mL normal saline bolus.  Hospitalist to follow-up thereafter with slow infusion and frequent sodium checks  We will treat nausea with Zofran, discussed with the patient and she reports similar symptoms of recurrent in the past in nature I do not see indication for emergent CT imaging the abdomen pelvis at this time.   The patient understanding comfortable plan for admission.  Recent Covid in the past, does not require repeat Covid test at this time, clinically not demonstrating symptoms to suggest recurrent COVID-19    Admission discussed with Dr. Arville Care at 715pm  ____________________________________________   FINAL CLINICAL IMPRESSION(S) / ED DIAGNOSES  Final diagnoses:   Hyponatremia  Loose stools        Note:  This document was prepared using Dragon voice recognition software and may include unintentional dictation errors       Sharyn Creamer, MD 04/27/19 1918

## 2019-04-27 NOTE — ED Notes (Signed)
Pt ambulatory to bathroom at this time.

## 2019-04-27 NOTE — ED Notes (Signed)
Pt's son Gerlene Burdock updated on pt's status and plan

## 2019-04-27 NOTE — ED Triage Notes (Signed)
FIRST NURSE NOTE- here for abd pain and NVD

## 2019-04-28 ENCOUNTER — Encounter: Payer: Self-pay | Admitting: Family Medicine

## 2019-04-28 LAB — BASIC METABOLIC PANEL
Anion gap: 9 (ref 5–15)
Anion gap: 12 (ref 5–15)
BUN: 9 mg/dL (ref 8–23)
BUN: 11 mg/dL (ref 8–23)
CO2: 28 mmol/L (ref 22–32)
CO2: 24 mmol/L (ref 22–32)
Calcium: 9 mg/dL (ref 8.9–10.3)
Calcium: 9 mg/dL (ref 8.9–10.3)
Chloride: 85 mmol/L — ABNORMAL LOW (ref 98–111)
Chloride: 85 mmol/L — ABNORMAL LOW (ref 98–111)
Creatinine, Ser: 0.91 mg/dL (ref 0.44–1.00)
Creatinine, Ser: 1.02 mg/dL — ABNORMAL HIGH (ref 0.44–1.00)
GFR calc Af Amer: >60 mL/min (ref >60)
GFR calc Af Amer: >60 mL/min (ref >60)
GFR calc non Af Amer: >60 mL/min (ref >60)
GFR calc non Af Amer: 54 mL/min — ABNORMAL LOW (ref >60)
Glucose, Bld: 106 mg/dL — ABNORMAL HIGH (ref 70–99)
Glucose, Bld: 220 mg/dL — ABNORMAL HIGH (ref 70–99)
Potassium: 3.7 mmol/L (ref 3.5–5.1)
Potassium: 3.8 mmol/L (ref 3.5–5.1)
Sodium: 122 mmol/L — ABNORMAL LOW (ref 135–145)
Sodium: 121 mmol/L — ABNORMAL LOW (ref 135–145)

## 2019-04-28 LAB — GLUCOSE, CAPILLARY
Glucose-Capillary: 109 mg/dL — ABNORMAL HIGH (ref 70–99)
Glucose-Capillary: 189 mg/dL — ABNORMAL HIGH (ref 70–99)
Glucose-Capillary: 186 mg/dL — ABNORMAL HIGH (ref 70–99)
Glucose-Capillary: 125 mg/dL — ABNORMAL HIGH (ref 70–99)

## 2019-04-28 LAB — OSMOLALITY, URINE: Osmolality, Ur: 237 mOsm/kg — ABNORMAL LOW (ref 300–900)

## 2019-04-28 LAB — CBC
HCT: 31.3 % — ABNORMAL LOW (ref 36.0–46.0)
Hemoglobin: 11.1 g/dL — ABNORMAL LOW (ref 12.0–15.0)
MCH: 29.6 pg (ref 26.0–34.0)
MCHC: 35.5 g/dL (ref 30.0–36.0)
MCV: 83.5 fL (ref 80.0–100.0)
Platelets: 281 10*3/uL (ref 150–400)
RBC: 3.75 MIL/uL — ABNORMAL LOW (ref 3.87–5.11)
RDW: 13.1 % (ref 11.5–15.5)
WBC: 9.9 10*3/uL (ref 4.0–10.5)
nRBC: 0 % (ref 0.0–0.2)

## 2019-04-28 LAB — C DIFFICILE QUICK SCREEN W PCR REFLEX
C Diff antigen: NEGATIVE
C Diff interpretation: NOT DETECTED
C Diff toxin: NEGATIVE

## 2019-04-28 LAB — NA AND K (SODIUM & POTASSIUM), RAND UR
Potassium Urine: 25 mmol/L
Sodium, Ur: 56 mmol/L

## 2019-04-28 LAB — HEMOGLOBIN A1C
Hgb A1c MFr Bld: 7.3 % — ABNORMAL HIGH (ref 4.8–5.6)
Mean Plasma Glucose: 162.81 mg/dL

## 2019-04-28 MED ORDER — PANCRELIPASE (LIP-PROT-AMYL) 12000-38000 UNITS PO CPEP
36000.0000 [IU] | ORAL_CAPSULE | Freq: Two times a day (BID) | ORAL | Status: DC | PRN
  Filled 2019-04-28: qty 3

## 2019-04-28 NOTE — ED Notes (Signed)
Answered pts call bell. Assisted pt to use bathroom.

## 2019-04-28 NOTE — Progress Notes (Signed)
Progress Note    Beth Tyler  JAS:[id number] DOB: Mar 16, 1946  DOA: 04/27/2019 PCP: Dale [place], MD      Brief Narrative:    Medical records reviewed and are as summarized below:       Assessment/Plan:   Active Problems:   Hyponatremia   Body mass index is 37.49 kg/m.  (Morbid obesity)   Severe hyponatremia: Slowly improving.  Low sodium is likely from diarrhea and possibly compounded by HCTZ use.  Continue IV fluids.  Monitor BMP closely.  Chronic pancreatitis: Continue [name].  She was informed that HCTZ may cause pancreatitis and that it was not clear whether this had anything to do with her chronic pancreatitis.  Patient said that she had taken HCTZ for many years.  She said pancreatitis was diagnosed within the last year.  After discussing the risk and benefits, patient decided she would avoid HCTZ for now.  Hypertension: Continue antihypertensives  Paroxysmal atrial fibrillation: Continue Eliquis and metoprolol  Type 2 diabetes mellitus: Metformin and Lantus on hold. NovoLog as needed for hyperglycemia.  Recent COVID-19 infection  Family Communication/Anticipated D/C date and plan/Code Status   DVT prophylaxis: Eliquis Code Status: Full code Family Communication: Plan discussed with patient Disposition Plan: Possible discharge to home in 1 to 2 days      Subjective:   Complains of nausea and diarrhea.  She said she had 2 watery stools this morning.  She said she has chronic diarrhea from IBS and pancreatic insufficiency.  However, she said she had some constipation recently until a few days ago when diarrhea.  No abdominal pain or vomiting.  Objective:    Vitals:   04/28/19 0545 04/28/19 0630 04/28/19 0800 04/28/19 1532  BP:  (!) 125/57 (!) 107/56 (!) 155/66  Pulse: (!) 55 (!) 54 69 63  Resp:   16 16  Temp:    97.7 F (36.5 C)  TempSrc:    Oral  SpO2: 96% 100% 94% 100%  Weight:      Height:        Intake/Output Summary (Last 24  hours) at 04/28/2019 1712 Last data filed at 04/28/2019 0730 Gross per 24 hour  Intake 293.75 ml  Output 500 ml  Net -206.25 ml   Filed Weights   04/27/19 1555  Weight: 93 kg    Exam:  GEN: NAD SKIN: No rash EYES: EOMI ENT: MMM CV: RRR PULM: CTA B ABD: soft, obese, NT, +BS CNS: AAO x 3, non focal EXT: No edema or tenderness   Data Reviewed:   I have personally reviewed following labs and imaging studies:  Labs: Labs show the following:   Basic Metabolic Panel: Recent Labs  Lab 04/27/19 1603 04/27/19 1603 04/28/19 0510 04/28/19 1541  NA 116*  --  122* 121*  K 4.4   < > 3.7 3.8  CL 79*  --  85* 85*  CO2 24  --  28 24  GLUCOSE 172*  --  106* 220*  BUN 10  --  9 11  CREATININE 0.87  --  0.91 1.02*  CALCIUM 9.0  --  9.0 9.0   < > = values in this interval not displayed.   GFR Estimated Creatinine Clearance: 51.4 mL/min (A) (by C-G formula based on SCr of 1.02 mg/dL (H)). Liver Function Tests: Recent Labs  Lab 04/27/19 1603  AST 22  ALT 19  ALKPHOS 60  BILITOT 1.1  PROT 7.1  ALBUMIN 3.9   Recent Labs  Lab 04/27/19  1603  LIPASE 22   No results for input(s): AMMONIA in the last 168 hours. Coagulation profile No results for input(s): INR, PROTIME in the last 168 hours.  CBC: Recent Labs  Lab 04/27/19 1603 04/28/19 0510  WBC 9.6 9.9  HGB 11.5* 11.1*  HCT 32.5* 31.3*  MCV 83.1 83.5  PLT 327 281   Cardiac Enzymes: No results for input(s): CKTOTAL, CKMB, CKMBINDEX, TROPONINI in the last 168 hours. BNP (last 3 results) No results for input(s): PROBNP in the last 8760 hours. CBG: Recent Labs  Lab 04/27/19 2220 04/28/19 0734 04/28/19 1159 04/28/19 1634  GLUCAP 134* 109* 189* 186*   D-Dimer: No results for input(s): DDIMER in the last 72 hours. Hgb A1c: Recent Labs    04/27/19 1603  HGBA1C 7.3*   Lipid Profile: No results for input(s): CHOL, HDL, LDLCALC, TRIG, CHOLHDL, LDLDIRECT in the last 72 hours. Thyroid function  studies: Recent Labs    04/27/19 1603  TSH 1.514   Anemia work up: No results for input(s): VITAMINB12, FOLATE, FERRITIN, TIBC, IRON, RETICCTPCT in the last 72 hours. Sepsis Labs: Recent Labs  Lab 04/27/19 1603 04/28/19 0510  WBC 9.6 9.9    Microbiology Recent Results (from the past 240 hour(s))  C difficile quick scan w PCR reflex     Status: None   Collection Time: 04/28/19 11:39 AM   Specimen: Stool  Result Value Ref Range Status   C Diff antigen NEGATIVE NEGATIVE Final   C Diff toxin NEGATIVE NEGATIVE Final   C Diff interpretation No C. difficile detected.  Final    Comment: Performed at [facility], [address] [place], [place] [address]    Procedures and diagnostic studies:  No results found.  Medications:   . amLODipine  10 mg Oral Daily  . apixaban  5 mg Oral BID  . fluticasone furoate-vilanterol  1 puff Inhalation Daily  . insulin aspart  0-9 Units Subcutaneous TID PC & HS  . lipase/protease/amylase  72,000 Units Oral TID WC  . lisinopril  20 mg Oral BID  . metoprolol tartrate  25 mg Oral BID  . pantoprazole  20 mg Oral BID  . pioglitazone  15 mg Oral Daily  . simvastatin  10 mg Oral QHS  . triamcinolone cream  1 application Topical BID   Continuous Infusions: . sodium chloride 50 mL/hr at 04/28/19 1655     LOS: 1 day   [place] [place]  [facility]   *Please refer to [email or web address], password [email or web address] to get updated schedule on who will round on this patient, as hospitalists switch teams weekly. If 7PM-7AM, please contact night-coverage at [email or web address], password [email or web address] for any overnight needs.  04/28/2019, 5:12 PM

## 2019-04-28 NOTE — ED Notes (Signed)
Pt ambulatory to bathroom at this time.

## 2019-04-28 NOTE — Consult Note (Signed)
[address] [place], [place] [address] Phone [phone]. Fax [phone]  Date: 04/28/2019                  Patient Name:  Beth Tyler  MRN: [id number]  DOB: June 16, 1945  Age / Sex: 74 y.o., female         PCP: Dale Lanagan, MD                 Service Requesting Consult:  Internal medicine/ Mansy, Vernetta Honey, MD                 Reason for Consult:  Hyponatremia            History of Present Illness: Patient is a 73 y.o. female  admitted to [facility] on 04/27/2019 for evaluation of Nausea, abdominal pain.  Patient has chronic diarrhea due to IBS and pancreatic insufficiency. She was diagnosed with Covid infection in early December 2020 and atrial fibrillation. Evaluation in the emergency room showed low sodium of 116.  Patient was admitted for further evaluation and management and nephrology consult has been requested for evaluation..    Medications: Outpatient medications: (Not in a hospital admission)   Current medications: Current Facility-Administered Medications  Medication Dose Route Frequency Provider Last Rate Last Admin  . 0.45 % sodium chloride infusion   Intravenous Continuous Mansy, Vernetta Honey, MD 50 mL/hr at 04/28/19 0541 Rate Verify at 04/28/19 0541  . acetaminophen (TYLENOL) tablet 650 mg  650 mg Oral Q6H PRN Mansy, Jan A, MD       Or  . acetaminophen (TYLENOL) suppository 650 mg  650 mg Rectal Q6H PRN Mansy, Jan A, MD      . amLODipine (NORVASC) tablet 10 mg  10 mg Oral Daily Mansy, Jan A, MD   10 mg at 04/28/19 1005  . apixaban (ELIQUIS) tablet 5 mg  5 mg Oral BID Mansy, Jan A, MD   5 mg at 04/28/19 1005  . fluticasone furoate-vilanterol (BREO ELLIPTA) 200-25 MCG/INH 1 puff  1 puff Inhalation Daily Mansy, Jan A, MD      . HYDROmorphone (DILAUDID) injection 1 mg  1 mg Intravenous Q4H PRN Mansy, Jan A, MD      . insulin aspart (novoLOG) injection 0-9 Units  0-9 Units Subcutaneous TID PC & HS Mansy, Vernetta Honey, MD   1 Units at 04/27/19 2225  .  lipase/protease/amylase ([name]) capsule 36,000 Units  36,000 Units Oral BID PRN Wayland Denis, RPH      . lipase/protease/amylase ([name]) capsule 72,000 Units  72,000 Units Oral TID WC Mansy, Vernetta Honey, MD   72,000 Units at 04/28/19 1002  . lisinopril (ZESTRIL) tablet 20 mg  20 mg Oral BID Mansy, Jan A, MD   20 mg at 04/28/19 1004  . magnesium hydroxide (MILK OF MAGNESIA) suspension 30 mL  30 mL Oral Daily PRN Mansy, Jan A, MD      . metoprolol tartrate (LOPRESSOR) tablet 25 mg  25 mg Oral BID Mansy, Jan A, MD   25 mg at 04/28/19 1005  . ondansetron (ZOFRAN) tablet 4 mg  4 mg Oral Q6H PRN Mansy, Jan A, MD       Or  . ondansetron [facility]) injection 4 mg  4 mg Intravenous Q6H PRN Mansy, Jan A, MD   4 mg at 04/28/19 0915  . pantoprazole (PROTONIX) EC tablet 20 mg  20 mg Oral BID Mansy, Jan A, MD   20 mg at 04/28/19 0316  . pioglitazone (  ACTOS) tablet 15 mg  15 mg Oral Daily Mansy, Jan A, MD      . simvastatin (ZOCOR) tablet 10 mg  10 mg Oral QHS Mansy, Jan A, MD   10 mg at 04/27/19 2322  . traZODone (DESYREL) tablet 25 mg  25 mg Oral QHS PRN Mansy, Jan A, MD      . triamcinolone cream (KENALOG) 0.1 % 1 application  1 application Topical BID Mansy, Arvella Merles, MD       Current Outpatient Medications  Medication Sig Dispense Refill  . acetaminophen (TYLENOL) 500 MG tablet Take 500-1,000 mg by mouth every 6 (six) hours as needed for mild pain or moderate pain.     Marland Kitchen amLODipine (NORVASC) 10 MG tablet Take 1 tablet by mouth once daily (Patient taking differently: Take 10 mg by mouth daily. ) 90 tablet 0  . apixaban (ELIQUIS) 5 MG TABS tablet Take 1 tablet (5 mg total) by mouth 2 (two) times daily. 60 tablet 1  . fluticasone (CUTIVATE) 0.05 % cream Apply 1 application topically as needed. Use as directed.    . fluticasone furoate-vilanterol (BREO ELLIPTA) 200-25 MCG/INH AEPB Inhale 1 puff into the lungs daily. 28 each 0  . hydrochlorothiazide (HYDRODIURIL) 25 MG tablet Take 1 tablet (25 mg total) by mouth daily.  90 tablet 1  . Insulin Glargine (BASAGLAR KWIKPEN) 100 UNIT/ML SOPN Inject 0.28 mLs (28 Units total) into the skin every morning.    . insulin lispro ([name]) 100 UNIT/ML injection Inject 0.04-0.08 mLs (4-8 Units total) into the skin 3 (three) times daily with meals.    . Insulin Pen Needle 32G X 4 MM MISC Use to inject insulin three times daily 300 each 3  . lipase/protease/amylase ([name]) 36000 UNITS CPEP capsule Take 36,000-72,000 Units by mouth See admin instructions. Take 2 capsules (72000 units) by mouth three times daily with meals and take 1 capsule (36000 units) by mouth daily with snacks    . lisinopril (ZESTRIL) 20 MG tablet Take 1 tablet (20 mg total) by mouth 2 (two) times daily.    . magnesium oxide (MAG-OX) 400 (241.3 Mg) MG tablet Take 1 tablet by mouth once daily (Patient taking differently: Take 400 mg by mouth daily. ) 90 tablet 0  . metFORMIN (GLUCOPHAGE-XR) 500 MG 24 hr tablet Take 1 tablet (500 mg total) by mouth 2 (two) times daily. (Patient taking differently: Take 1,000 mg by mouth 2 (two) times daily. )    . metoprolol tartrate (LOPRESSOR) 25 MG tablet Take 25 mg by mouth 2 (two) times daily.    Glory Rosebush VERIO test strip USE 1 STRIP TO CHECK GLUCOSE TWICE DAILY (Patient taking differently: 1 each by Other route 2 (two) times daily. ) 200 each 0  . pantoprazole (PROTONIX) 40 MG tablet Take 1 tablet (40 mg total) by mouth 2 (two) times daily.    . pioglitazone (ACTOS) 15 MG tablet Take 1 tablet by mouth once daily (Patient taking differently: Take 15 mg by mouth daily. ) 90 tablet 0  . simvastatin (ZOCOR) 10 MG tablet TAKE 1 TABLET BY MOUTH AT BEDTIME (Patient taking differently: Take 10 mg by mouth at bedtime. ) 90 tablet 0  . triamcinolone cream (KENALOG) 0.1 % APPLY  CREAM TOPICALLY TWICE DAILY (Patient taking differently: Apply 1 application topically 2 (two) times daily. ) 15 g 0      Allergies: Allergies  Allergen Reactions  . Hydrocodone-Acetaminophen Anaphylaxis   . Percocet [Oxycodone-Acetaminophen] Anaphylaxis  . Demeclocycline Nausea Only  .  Penicillins Nausea Only    Did it involve swelling of the face/tongue/throat, SOB, or low BP? No Did it involve sudden or severe rash/hives, skin peeling, or any reaction on the inside of your mouth or nose? No Did you need to seek medical attention at a hospital or doctor's office? No When did it last happen? If all above answers are "NO", may proceed with cephalosporin use.  . Tetracyclines & Related Nausea Only      Past Medical History: Past Medical History:  Diagnosis Date  . Anemia   . Arthritis   . B12 deficiency   . Carpal tunnel syndrome, bilateral   . Diabetes mellitus (HCC) 02/17/2012  . Diabetes mellitus, type II (HCC)   . Dyspnea   . Gastritis   . GERD (gastroesophageal reflux disease)   . Hypercholesterolemia   . Hypertension   . IBS (irritable bowel syndrome)   . Neuropathy    legs  . Pernicious anemia   . Vertigo    no episodes fo 9 or 10 yrs     Past Surgical History: Past Surgical History:  Procedure Laterality Date  . ANTERIOR VITRECTOMY Right 12/01/2017   Procedure: ANTERIOR VITRECTOMY;  Surgeon: Nevada Crane, MD;  Location: [facility];  Service: Ophthalmology;  Laterality: Right;  . BREAST BIOPSY Right 1980   neg  . BREAST SURGERY  1980   benign cyst removal  . CATARACT EXTRACTION W/PHACO Left 10/13/2017   Procedure: CATARACT EXTRACTION PHACO AND INTRAOCULAR LENS PLACEMENT (IOC)  LEFT  DIABETES;  Surgeon: Nevada Crane, MD;  Location: [facility];  Service: Ophthalmology;  Laterality: Left;  Diabetic - insulin and oral meds  . CATARACT EXTRACTION W/PHACO Right 12/01/2017   Procedure: CATARACT EXTRACTION PHACO AND INTRAOCULAR LENS PLACEMENT (IOC)  RIGHT DIABETIC;  Surgeon: Nevada Crane, MD;  Location: [facility];  Service: Ophthalmology;  Laterality: Right;  Diabetic - insulin and oral  . CHOLECYSTECTOMY  1983      Family History: Family History  Problem Relation Age of Onset  . Diabetes Father   . Kidney disease Father   . Heart disease Father   . Alcohol abuse Mother   . Dementia Mother   . Diabetes Mother   . Diabetes Other   . Heart disease Son 47       CABG  . Hemophilia Brother   . Uterine cancer Sister   . Diabetes Sister   . COPD Sister   . Breast cancer Maternal Aunt 51  . Breast cancer Daughter 48  . Stroke Daughter      Social History: Social History   Socioeconomic History  . Marital status: Widowed    Spouse name: Not on file  . Number of children: 4  . Years of education: Not on file  . Highest education level: Not on file  Occupational History  . Not on file  Tobacco Use  . Smoking status: Never Smoker  . Smokeless tobacco: Never Used  Substance and Sexual Activity  . Alcohol use: No    Alcohol/week: 0.0 standard drinks  . Drug use: No  . Sexual activity: Not Currently  Other Topics Concern  . Not on file  Social History Narrative   Widowed. Has four children. Manager of an apartment building. No tobacco, alcohol or other drug use   Social Determinants of Health   Financial Resource Strain:   . Difficulty of Paying Living Expenses: Not on file  Food Insecurity:   . Worried About  Running Out of Food in the Last Year: Not on file  . Ran Out of Food in the Last Year: Not on file  Transportation Needs:   . Lack of Transportation (Medical): Not on file  . Lack of Transportation (Non-Medical): Not on file  Physical Activity:   . Days of Exercise per Week: Not on file  . Minutes of Exercise per Session: Not on file  Stress: No Stress Concern Present  . Feeling of Stress : Not at all  Social Connections: Slightly Isolated  . Frequency of Communication with Friends and Family: More than three times a week  . Frequency of Social Gatherings with Friends and Family: More than three times a week  . Attends Religious Services: 1 to 4 times per year  .  Active Member of Clubs or Organizations: Yes  . Attends Banker Meetings: 1 to 4 times per year  . Marital Status: Widowed  Intimate Partner Violence:   . Fear of Current or Ex-Partner: Not on file  . Emotionally Abused: Not on file  . Physically Abused: Not on file  . Sexually Abused: Not on file     Review of Systems: Gen: Denies any fevers or chills or weight changes HEENT: No vision or hearing complaints CV: No chest pain or leg edema Resp: No cough, sputum production or hemoptysis GI: Nausea, decreased oral intake.  Patient was able to take her medications including diuretic hydrochlorothiazide GU : No problems reported with voiding, no blood in the urine MS: No complaints Derm: No complaints  Psych: No complaint Heme: No complaints Neuro: No complaints Endocrine: No complaints  Vital Signs: Blood pressure (!) 107/56, pulse 69, temperature 98.2 F (36.8 C), temperature source Oral, resp. rate 16, height 5\' 2"  (1.575 m), weight 93 kg, SpO2 94 %.   Intake/Output Summary (Last 24 hours) at 04/28/2019 1028 Last data filed at 04/28/2019 0730 Gross per 24 hour  Intake 293.75 ml  Output 500 ml  Net -206.25 ml    Weight trends: 04/30/2019   04/27/19 1555  Weight: 93 kg    Physical Exam: General:  Sitting up in the bed, no acute distress  HEENT  anicteric, moist oral mucous membranes  Lungs:  Normal breathing effort, clear to auscultation  Heart::  Regular, no rub or gallop  Abdomen:  Soft, nontender  Extremities:  No peripheral edema  Neurologic:  Alert, oriented  Skin:  No acute rashes    Lab results: Basic Metabolic Panel: Recent Labs  Lab 04/27/19 1603 04/28/19 0510  NA 116* 122*  K 4.4 3.7  CL 79* 85*  CO2 24 28  GLUCOSE 172* 106*  BUN 10 9  CREATININE 0.87 0.91  CALCIUM 9.0 9.0    Liver Function Tests: Recent Labs  Lab 04/27/19 1603  AST 22  ALT 19  ALKPHOS 60  BILITOT 1.1  PROT 7.1  ALBUMIN 3.9   Recent Labs  Lab  04/27/19 1603  LIPASE 22   No results for input(s): AMMONIA in the last 168 hours.  CBC: Recent Labs  Lab 04/27/19 1603 04/28/19 0510  WBC 9.6 9.9  HGB 11.5* 11.1*  HCT 32.5* 31.3*  MCV 83.1 83.5  PLT 327 281    Cardiac Enzymes: No results for input(s): CKTOTAL, TROPONINI in the last 168 hours.  BNP: Invalid input(s): POCBNP  CBG: Recent Labs  Lab 04/27/19 2220 04/28/19 0734  GLUCAP 134* 109*    Microbiology: No results found for this or any previous visit (from  the past 720 hour(s)).   Coagulation Studies: No results for input(s): LABPROT, INR in the last 72 hours.  Urinalysis: Recent Labs    04/27/19 1603  COLORURINE YELLOW*  LABSPEC 1.008  PHURINE 6.0  GLUCOSEU NEGATIVE  HGBUR NEGATIVE  BILIRUBINUR NEGATIVE  KETONESUR NEGATIVE  PROTEINUR NEGATIVE  NITRITE NEGATIVE  LEUKOCYTESUR NEGATIVE        Imaging:  No results found.   Assessment & Plan: Pt is a 74 y.o. Caucasian female with diabetes type 2, GERD, dyslipidemia, hypertension, chronic kidney disease, atrial fibrillation, [facility] was admitted on 04/27/2019 with Hyponatremia [E87.1]  1.  Hyponatremia Likely secondary to volume depletion from hydrochlorothiazide in setting of nausea and vomiting Patient's sodium was 137 back in December 2020.  Since then it has been in the low range Agree with half-normal saline infusion for gentle hydration. Hold HCTZ Goal for correction is approximately 8 mEq in 24 hours on average.  Sodium improved from 116 yesterday to 122 this morning. Patient is still nauseous this morning therefore continue IV hydration Follow sodium level closely  2.  Chronic kidney disease, unspecified Patient is followed as outpatient by our practice by Dr. Cherylann RatelLateef.  Baseline creatinine of 0.87, GFR greater than 60       LOS: 1 Pascal Stiggers 1/20/202110:28 AM    Note: This note was prepared with Dragon dictation. Any transcription errors are  unintentional

## 2019-04-29 LAB — BASIC METABOLIC PANEL
Anion gap: 10 (ref 5–15)
BUN: 12 mg/dL (ref 8–23)
CO2: 24 mmol/L (ref 22–32)
Calcium: 8.6 mg/dL — ABNORMAL LOW (ref 8.9–10.3)
Chloride: 89 mmol/L — ABNORMAL LOW (ref 98–111)
Creatinine, Ser: 0.94 mg/dL (ref 0.44–1.00)
GFR calc Af Amer: >60 mL/min (ref >60)
GFR calc non Af Amer: 60 mL/min — ABNORMAL LOW (ref >60)
Glucose, Bld: 115 mg/dL — ABNORMAL HIGH (ref 70–99)
Potassium: 4 mmol/L (ref 3.5–5.1)
Sodium: 123 mmol/L — ABNORMAL LOW (ref 135–145)

## 2019-04-29 LAB — GLUCOSE, CAPILLARY
Glucose-Capillary: 109 mg/dL — ABNORMAL HIGH (ref 70–99)
Glucose-Capillary: 177 mg/dL — ABNORMAL HIGH (ref 70–99)
Glucose-Capillary: 174 mg/dL — ABNORMAL HIGH (ref 70–99)
Glucose-Capillary: 181 mg/dL — ABNORMAL HIGH (ref 70–99)

## 2019-04-29 MED ORDER — ADULT MULTIVITAMIN W/MINERALS CH
1.0000 | ORAL_TABLET | Freq: Every day | ORAL | Status: DC
  Administered 2019-04-30 – 2019-05-01 (×2): 1 via ORAL
  Filled 2019-04-29 (×2): qty 1

## 2019-04-29 MED ORDER — GLUCERNA SHAKE PO LIQD
237.0000 mL | Freq: Three times a day (TID) | ORAL | Status: DC
  Administered 2019-04-29 – 2019-05-01 (×6): 237 mL via ORAL

## 2019-04-29 MED ORDER — SODIUM CHLORIDE 0.9 % IV SOLN: INTRAVENOUS | Status: DC

## 2019-04-29 MED ORDER — AMLODIPINE BESYLATE 10 MG PO TABS
10.0000 mg | ORAL_TABLET | Freq: Every day | ORAL | Status: DC
  Administered 2019-04-29 – 2019-04-30 (×2): 10 mg via ORAL
  Filled 2019-04-29 (×2): qty 1

## 2019-04-29 NOTE — Progress Notes (Signed)
Initial Nutrition Assessment  DOCUMENTATION CODES:   Obesity unspecified  INTERVENTION:   Glucerna Shake po TID, each supplement provides 220 kcal and 10 grams of protein  MVI daily  NUTRITION DIAGNOSIS:   Inadequate oral intake related to acute illness as evidenced by per patient/family report.  GOAL:   Patient will meet greater than or equal to 90% of their needs  MONITOR:   Supplement acceptance, Labs, Skin, Weight trends, PO intake, I & O's  REASON FOR ASSESSMENT:   Malnutrition Screening Tool    ASSESSMENT:   74 y.o. Caucasian female with h/o type II diabetes mellitus, GERD, dyslipidemia, hypertension IBS, recent COVID infection, B12 deficiency and pancreatic insufficiency with chronic diarrhea who is admitted with nausea, diarrhea and abdominal pain.   Spoke with patient today. Pt reports decreased appetite and oral intake for ~1 week pta r/t nausea and abdominal pain. Pt reports that she continues to have poor appetite and nausea today; pt was unable to eat breakfast. Pt is willing to try diabetic friendly nutritional supplements while in hospital. RD will order Glucerna, pt may also have Ensure Max if she prefers. Pt with recent COVID 19 where she lost ~5lbs but it appears, per chart, that patient had regained some of her weight pta.    Medications reviewed and include: insulin, creon, protonix, actos, NaCl @50ml /hr  Labs reviewed: Na 123(L), K 4.0 wnl cbgs- 109, 189, 186, 125, 109 x 24 hrs AIC 7.3(H)- 1/19  NUTRITION - FOCUSED PHYSICAL EXAM:    Most Recent Value  Orbital Region  No depletion  Upper Arm Region  No depletion  Thoracic and Lumbar Region  No depletion  Buccal Region  No depletion  Temple Region  No depletion  Clavicle Bone Region  No depletion  Clavicle and Acromion Bone Region  No depletion  Scapular Bone Region  No depletion  Dorsal Hand  No depletion  Patellar Region  No depletion  Anterior Thigh Region  No depletion  Posterior Calf  Region  No depletion  Edema (RD Assessment)  None  Hair  Reviewed  Eyes  Reviewed  Mouth  Reviewed  Skin  Reviewed  Nails  Reviewed     Diet Order:   Diet Order            Diet Carb Modified Fluid consistency: Thin; Room service appropriate? Yes  Diet effective now             EDUCATION NEEDS:   Education needs have been addressed  Skin:  Skin Assessment: Reviewed RN Assessment  Last BM:  1/20  Height:   Ht Readings from Last 1 Encounters:  04/27/19 5\' 2"  (1.575 m)    Weight:   Wt Readings from Last 1 Encounters:  04/27/19 93 kg    Ideal Body Weight:  50 kg  BMI:  Body mass index is 37.49 kg/m.  Estimated Nutritional Needs:   Kcal:  1700-2000kcal/day  Protein:  85-100g/day  Fluid:  >1.5L/day  MS, RD, LDN Pager #- [phone] Office#- [phone] After Hours Pager: [phone]

## 2019-04-29 NOTE — Progress Notes (Signed)
[facility] [place], [place] 04/29/19  Subjective:   Hospital day # 2 States she is nauseous. Took zofran earlier Na improved to 123   Renal: 01/20 0701 - 01/21 0700 In: 1623.7 [I.V.:1623.7] Out: 900 [Urine:900] Lab Results  Component Value Date   CREATININE 0.94 04/29/2019   CREATININE 1.02 (H) 04/28/2019   CREATININE 0.91 04/28/2019     Objective:  Vital signs in last 24 hours:  Temp:  [97.6 F (36.4 C)-97.8 F (36.6 C)] 97.8 F (36.6 C) (01/21 1145) Pulse Rate:  [52-70] 70 (01/21 1145) Resp:  [16-18] 16 (01/21 1145) BP: (112-143)/(46-69) 143/50 (01/21 1145) SpO2:  [97 %-100 %] 100 % (01/21 1145)  Weight change:  Filed Weights   04/27/19 1555  Weight: 93 kg    Intake/Output:    Intake/Output Summary (Last 24 hours) at 04/29/2019 1702 Last data filed at 04/29/2019 1257 Gross per 24 hour  Intake 952.61 ml  Output 1100 ml  Net -147.39 ml    Physical Exam: General:  Sitting up in the bed, no acute distress  HEENT  anicteric, moist oral mucous membranes  Lungs:  Normal breathing effort, clear to auscultation  Heart::  Regular, no rub or gallop  Abdomen:  Soft, nontender  Extremities:  trace peripheral edema  Neurologic:  Alert, oriented  Skin:  chronic discoloration over legs      Basic Metabolic Panel:  Recent Labs  Lab 04/27/19 1603 04/27/19 1603 04/28/19 0510 04/28/19 1541 04/29/19 0806  NA 116*  --  122* 121* 123*  K 4.4  --  3.7 3.8 4.0  CL 79*  --  85* 85* 89*  CO2 24  --  28 24 24   GLUCOSE 172*  --  106* 220* 115*  BUN 10  --  9 11 12   CREATININE 0.87  --  0.91 1.02* 0.94  CALCIUM 9.0   < > 9.0 9.0 8.6*   < > = values in this interval not displayed.     CBC: Recent Labs  Lab 04/27/19 1603 04/28/19 0510  WBC 9.6 9.9  HGB 11.5* 11.1*  HCT 32.5* 31.3*  MCV 83.1 83.5  PLT 327 281     No results found for: HEPBSAG, HEPBSAB, HEPBIGM    Microbiology:  Recent Results (from the past 240 hour(s))  C  difficile quick scan w PCR reflex     Status: None   Collection Time: 04/28/19 11:39 AM   Specimen: Stool  Result Value Ref Range Status   C Diff antigen NEGATIVE NEGATIVE Final   C Diff toxin NEGATIVE NEGATIVE Final   C Diff interpretation No C. difficile detected.  Final    Comment: Performed at [facility], [address] [place], [address] [place]    Coagulation Studies: No results for input(s): LABPROT, INR in the last 72 hours.  Urinalysis: Recent Labs    04/27/19 1603  COLORURINE YELLOW*  LABSPEC 1.008  PHURINE 6.0  GLUCOSEU NEGATIVE  HGBUR NEGATIVE  BILIRUBINUR NEGATIVE  KETONESUR NEGATIVE  PROTEINUR NEGATIVE  NITRITE NEGATIVE  LEUKOCYTESUR NEGATIVE      Imaging: No results found.   Medications:   . sodium chloride 75 mL/hr at 04/29/19 1257   . amLODipine  10 mg Oral QHS  . apixaban  5 mg Oral BID  . feeding supplement (GLUCERNA SHAKE)  237 mL Oral TID BM  . fluticasone furoate-vilanterol  1 puff Inhalation Daily  . insulin aspart  0-9 Units Subcutaneous TID PC & HS  . lipase/protease/amylase  72,000 Units Oral TID WC  . lisinopril  20 mg Oral BID  . metoprolol tartrate  25 mg Oral BID  . [START ON 04/30/2019] multivitamin with minerals  1 tablet Oral Daily  . pantoprazole  20 mg Oral BID  . pioglitazone  15 mg Oral Daily  . simvastatin  10 mg Oral QHS  . triamcinolone cream  1 application Topical BID   acetaminophen **OR** acetaminophen, HYDROmorphone (DILAUDID) injection, lipase/protease/amylase, magnesium hydroxide, ondansetron **OR** ondansetron (ZOFRAN) IV, traZODone  Assessment/ Plan:  74 y.o. female with   diabetes type 2, GERD, dyslipidemia, hypertension, chronic kidney disease, atrial fibrillation, IBS followed at [facility]    admitted on 04/27/2019 for Hyponatremia [E87.1] Loose stools [R19.5]  1.  Hyponatremia Likely secondary to volume depletion from hydrochlorothiazide in setting of nausea and vomiting Patient's sodium was  137 back in December 2020.  Since then it has been in the low range Hold HCTZ Goal for correction is approximately 8 mEq in 24 hours on average.  Sodium improved to 123 this morning. Change iv fluids to saline Patient is still nauseous this morning therefore continue IV hydration Follow sodium level closely  2.  Chronic kidney disease, unspecified Patient is followed as outpatient by our practice by Dr. Holley Raring.  Baseline creatinine of 0.87, GFR greater than 60     LOS: 2 Markcus Lazenby 1/21/20215:02 PM  Graham, Cash  Note: This note was prepared with Dragon dictation. Any transcription errors are unintentional

## 2019-04-29 NOTE — Progress Notes (Addendum)
Progress Note    Beth Tyler  ZOX:[id number] DOB: [birth year]  DOA: 04/27/2019 PCP: Dale Woodson, MD      Brief Narrative:    Medical records reviewed and are as summarized below:       Assessment/Plan:   Active Problems:   Hyponatremia   Body mass index is 37.49 kg/m.  (Morbid obesity)   Nausea and hyponatremia: Slowly improving.  Low sodium is likely from diarrhea and possibly compounded by HCTZ use.  IV fluids has been changed from half-normal saline to normal saline.  Antiemetics as needed.  Monitor BMP closely.  Chronic pancreatitis: Continue [name].  She was informed that HCTZ may cause pancreatitis and that it was not clear whether this had anything to do with her chronic pancreatitis.  Patient said that she had taken HCTZ for many years.  She said pancreatitis was diagnosed within the last year.  After discussing the risks and benefits, patient decided she would avoid HCTZ for now.  Hypertension: Continue antihypertensives  Paroxysmal atrial fibrillation: Continue Eliquis and metoprolol  Type 2 diabetes mellitus: Metformin and Lantus on hold. NovoLog as needed for hyperglycemia.  Recent COVID-19 infection  Family Communication/Anticipated D/C date and plan/Code Status   DVT prophylaxis: Eliquis Code Status: Full code Family Communication: Plan discussed with patient Disposition Plan: Possible discharge to home in 1 to 2 days      Subjective:   Still has nausea and required Zofran this morning.  When I saw her this morning she had not had any diarrhea.  No vomiting or abdominal pain.  Objective:    Vitals:   04/28/19 1948 04/29/19 0439 04/29/19 1019 04/29/19 1145  BP: (!) 129/59 (!) 112/46 134/69 (!) 143/50  Pulse: (!) 57 (!) 52 (!) 56 70  Resp: 18 16  16   Temp: 97.6 F (36.4 C) 97.6 F (36.4 C)  97.8 F (36.6 C)  TempSrc: Oral Oral  Oral  SpO2: 99% 97%  100%  Weight:      Height:        Intake/Output Summary (Last 24 hours)  at 04/29/2019 1732 Last data filed at 04/29/2019 1257 Gross per 24 hour  Intake 952.61 ml  Output 1100 ml  Net -147.39 ml   Filed Weights   04/27/19 1555  Weight: 93 kg    Exam:  GEN: NAD SKIN: Chronic skin changes/erythema on lower legs EYES: EOMI ENT: MMM CV: RRR PULM: No wheezing or crackles heard ABD: soft, obese, nontender  CNS: Active and oriented x3. EXT: No tenderness or swelling   Data Reviewed:   I have personally reviewed following labs and imaging studies:  Labs: Labs show the following:   Basic Metabolic Panel: Recent Labs  Lab 04/27/19 1603 04/27/19 1603 04/28/19 0510 04/28/19 0510 04/28/19 1541 04/29/19 0806  NA 116*  --  122*  --  121* 123*  K 4.4   < > 3.7   < > 3.8 4.0  CL 79*  --  85*  --  85* 89*  CO2 24  --  28  --  24 24  GLUCOSE 172*  --  106*  --  220* 115*  BUN 10  --  9  --  11 12  CREATININE 0.87  --  0.91  --  1.02* 0.94  CALCIUM 9.0  --  9.0  --  9.0 8.6*   < > = values in this interval not displayed.   GFR Estimated Creatinine Clearance: 55.8 mL/min (by C-G formula based  on SCr of 0.94 mg/dL). Liver Function Tests: Recent Labs  Lab 04/27/19 1603  AST 22  ALT 19  ALKPHOS 60  BILITOT 1.1  PROT 7.1  ALBUMIN 3.9   Recent Labs  Lab 04/27/19 1603  LIPASE 22   No results for input(s): AMMONIA in the last 168 hours. Coagulation profile No results for input(s): INR, PROTIME in the last 168 hours.  CBC: Recent Labs  Lab 04/27/19 1603 04/28/19 0510  WBC 9.6 9.9  HGB 11.5* 11.1*  HCT 32.5* 31.3*  MCV 83.1 83.5  PLT 327 281   Cardiac Enzymes: No results for input(s): CKTOTAL, CKMB, CKMBINDEX, TROPONINI in the last 168 hours. BNP (last 3 results) No results for input(s): PROBNP in the last 8760 hours. CBG: Recent Labs  Lab 04/28/19 1634 04/28/19 2139 04/29/19 0758 04/29/19 1137 04/29/19 1718  GLUCAP 186* 125* 109* 177* 174*   D-Dimer: No results for input(s): DDIMER in the last 72 hours. Hgb A1c: Recent  Labs    04/27/19 1603  HGBA1C 7.3*   Lipid Profile: No results for input(s): CHOL, HDL, LDLCALC, TRIG, CHOLHDL, LDLDIRECT in the last 72 hours. Thyroid function studies: Recent Labs    04/27/19 1603  TSH 1.514   Anemia work up: No results for input(s): VITAMINB12, FOLATE, FERRITIN, TIBC, IRON, RETICCTPCT in the last 72 hours. Sepsis Labs: Recent Labs  Lab 04/27/19 1603 04/28/19 0510  WBC 9.6 9.9    Microbiology Recent Results (from the past 240 hour(s))  C difficile quick scan w PCR reflex     Status: None   Collection Time: 04/28/19 11:39 AM   Specimen: Stool  Result Value Ref Range Status   C Diff antigen NEGATIVE NEGATIVE Final   C Diff toxin NEGATIVE NEGATIVE Final   C Diff interpretation No C. difficile detected.  Final    Comment: Performed at [facility], [address] [place], [place] [address]    Procedures and diagnostic studies:  No results found.  Medications:    amLODipine  10 mg Oral QHS   apixaban  5 mg Oral BID   feeding supplement (GLUCERNA SHAKE)  237 mL Oral TID BM   fluticasone furoate-vilanterol  1 puff Inhalation Daily   insulin aspart  0-9 Units Subcutaneous TID PC & HS   lipase/protease/amylase  72,000 Units Oral TID WC   lisinopril  20 mg Oral BID   metoprolol tartrate  25 mg Oral BID   [START ON 04/30/2019] multivitamin with minerals  1 tablet Oral Daily   pantoprazole  20 mg Oral BID   pioglitazone  15 mg Oral Daily   simvastatin  10 mg Oral QHS   triamcinolone cream  1 application Topical BID   Continuous Infusions:  sodium chloride 75 mL/hr at 04/29/19 1257     LOS: 2 days   Abena Erdman  [facility]   *Please refer to [email or web address], password [email or web address] to get updated schedule on who will round on this patient, as hospitalists switch teams weekly. If 7PM-7AM, please contact night-coverage at [email or web address], password [email or web address] for any overnight needs.  04/29/2019, 5:32 PM

## 2019-04-30 LAB — BASIC METABOLIC PANEL
Anion gap: 9 (ref 5–15)
Anion gap: 10 (ref 5–15)
BUN: 12 mg/dL (ref 8–23)
BUN: 12 mg/dL (ref 8–23)
CO2: 26 mmol/L (ref 22–32)
CO2: 24 mmol/L (ref 22–32)
Calcium: 8.9 mg/dL (ref 8.9–10.3)
Calcium: 9 mg/dL (ref 8.9–10.3)
Chloride: 93 mmol/L — ABNORMAL LOW (ref 98–111)
Chloride: 96 mmol/L — ABNORMAL LOW (ref 98–111)
Creatinine, Ser: 0.86 mg/dL (ref 0.44–1.00)
Creatinine, Ser: 0.81 mg/dL (ref 0.44–1.00)
GFR calc Af Amer: >60 mL/min (ref >60)
GFR calc Af Amer: >60 mL/min (ref >60)
GFR calc non Af Amer: >60 mL/min (ref >60)
GFR calc non Af Amer: >60 mL/min (ref >60)
Glucose, Bld: 127 mg/dL — ABNORMAL HIGH (ref 70–99)
Glucose, Bld: 186 mg/dL — ABNORMAL HIGH (ref 70–99)
Potassium: 4.6 mmol/L (ref 3.5–5.1)
Potassium: 4.7 mmol/L (ref 3.5–5.1)
Sodium: 128 mmol/L — ABNORMAL LOW (ref 135–145)
Sodium: 130 mmol/L — ABNORMAL LOW (ref 135–145)

## 2019-04-30 LAB — GLUCOSE, CAPILLARY
Glucose-Capillary: 126 mg/dL — ABNORMAL HIGH (ref 70–99)
Glucose-Capillary: 173 mg/dL — ABNORMAL HIGH (ref 70–99)
Glucose-Capillary: 150 mg/dL — ABNORMAL HIGH (ref 70–99)
Glucose-Capillary: 203 mg/dL — ABNORMAL HIGH (ref 70–99)

## 2019-04-30 NOTE — Plan of Care (Signed)
Continuing with plan of care. 

## 2019-04-30 NOTE — Care Management Important Message (Signed)
Important Message  Patient Details  Name: Beth Tyler MRN: [id number] Date of Birth: July 09, 1945   Medicare Important Message Given:  Yes  Reviewed verbally with patient over room phone due to isolation.  Declined copy as one recently received.    [name] 04/30/2019, 1:02 PM

## 2019-04-30 NOTE — Progress Notes (Signed)
Progress Note    Beth Tyler  TXM:[id number] DOB: 1945-10-24  DOA: 04/27/2019 PCP: Beth [place], MD      Brief Narrative:    Medical records reviewed and are as summarized below:       Assessment/Plan:   Active Problems:   Hyponatremia   Body mass index is 37.49 kg/m.  (Morbid obesity)   Nausea and hyponatremia: Slowly improving.  Low sodium is likely from diarrhea and possibly compounded by HCTZ use.  Continue IV fluids.  Antiemetics as needed.  Monitor BMP closely.  Chronic pancreatitis: Continue [name].  She was informed that HCTZ may cause pancreatitis and that it was not clear whether this had anything to do with her chronic pancreatitis.  Patient said that she had taken HCTZ for many years.  She said pancreatitis was diagnosed within the last year.  After discussing the risks and benefits, patient decided she would avoid HCTZ for now.  Hypertension: Continue antihypertensives  Paroxysmal atrial fibrillation: Continue Eliquis and metoprolol  Type 2 diabetes mellitus: Metformin and Lantus on hold. NovoLog as needed for hyperglycemia.  Recent COVID-19 infection  Family Communication/Anticipated D/C date and plan/Code Status   DVT prophylaxis: Eliquis Code Status: Full code Family Communication: Plan discussed with patient Disposition Plan: Possible discharge to home tomorrow     Subjective:   No nausea, vomiting or abdominal pain.  She had not had diarrhea this morning when I saw her.  Objective:    Vitals:   04/29/19 2012 04/30/19 0444 04/30/19 0900 04/30/19 1213  BP: (!) 124/57 (!) 91/51 132/61 (!) 129/56  Pulse: 65 (!) 54  66  Resp: 16 18  18   Temp: 97.8 F (36.6 C) (!) 97.5 F (36.4 C)  98.3 F (36.8 C)  TempSrc: Oral Oral  Oral  SpO2: 98% 98%  100%  Weight:      Height:        Intake/Output Summary (Last 24 hours) at 04/30/2019 1759 Last data filed at 04/30/2019 1400 Gross per 24 hour  Intake 1159.77 ml  Output 700 ml    Net 459.77 ml   Filed Weights   04/27/19 1555  Weight: 93 kg    Exam:  GEN: NAD SKIN: Chronic skin changes/erythema on lower legs EYES: EOMI ENT: MMM CV: Rate and rhythm  PULM: Clear to auscultation bilaterally ABD: soft, obese, nontender  CNS: Active and oriented x3. EXT: No tenderness or swelling   Data Reviewed:   I have personally reviewed following labs and imaging studies:  Labs: Labs show the following:   Basic Metabolic Panel: Recent Labs  Lab 04/28/19 0510 04/28/19 0510 04/28/19 1541 04/28/19 1541 04/29/19 0806 04/29/19 0806 04/30/19 0451 04/30/19 1411  NA 122*  --  121*  --  123*  --  128* 130*  K 3.7   < > 3.8   < > 4.0   < > 4.6 4.7  CL 85*  --  85*  --  89*  --  93* 96*  CO2 28  --  24  --  24  --  26 24  GLUCOSE 106*  --  220*  --  115*  --  127* 186*  BUN 9  --  11  --  12  --  12 12  CREATININE 0.91  --  1.02*  --  0.94  --  0.86 0.81  CALCIUM 9.0  --  9.0  --  8.6*  --  8.9 9.0   < > = values  in this interval not displayed.   GFR Estimated Creatinine Clearance: 64.7 mL/min (by C-G formula based on SCr of 0.81 mg/dL). Liver Function Tests: Recent Labs  Lab 04/27/19 1603  AST 22  ALT 19  ALKPHOS 60  BILITOT 1.1  PROT 7.1  ALBUMIN 3.9   Recent Labs  Lab 04/27/19 1603  LIPASE 22   No results for input(s): AMMONIA in the last 168 hours. Coagulation profile No results for input(s): INR, PROTIME in the last 168 hours.  CBC: Recent Labs  Lab 04/27/19 1603 04/28/19 0510  WBC 9.6 9.9  HGB 11.5* 11.1*  HCT 32.5* 31.3*  MCV 83.1 83.5  PLT 327 281   Cardiac Enzymes: No results for input(s): CKTOTAL, CKMB, CKMBINDEX, TROPONINI in the last 168 hours. BNP (last 3 results) No results for input(s): PROBNP in the last 8760 hours. CBG: Recent Labs  Lab 04/29/19 1718 04/29/19 2116 04/30/19 0746 04/30/19 1210 04/30/19 1631  GLUCAP 174* 181* 126* 173* 150*   D-Dimer: No results for input(s): DDIMER in the last 72 hours. Hgb  A1c: No results for input(s): HGBA1C in the last 72 hours. Lipid Profile: No results for input(s): CHOL, HDL, LDLCALC, TRIG, CHOLHDL, LDLDIRECT in the last 72 hours. Thyroid function studies: No results for input(s): TSH, T4TOTAL, T3FREE, THYROIDAB in the last 72 hours.  Invalid input(s): FREET3 Anemia work up: No results for input(s): VITAMINB12, FOLATE, FERRITIN, TIBC, IRON, RETICCTPCT in the last 72 hours. Sepsis Labs: Recent Labs  Lab 04/27/19 1603 04/28/19 0510  WBC 9.6 9.9    Microbiology Recent Results (from the past 240 hour(s))  C difficile quick scan w PCR reflex     Status: None   Collection Time: 04/28/19 11:39 AM   Specimen: Stool  Result Value Ref Range Status   C Diff antigen NEGATIVE NEGATIVE Final   C Diff toxin NEGATIVE NEGATIVE Final   C Diff interpretation No C. difficile detected.  Final    Comment: Performed at [facility] [address] [place], [place] [address]    Procedures and diagnostic studies:  No results found.  Medications:   . amLODipine  10 mg Oral QHS  . apixaban  5 mg Oral BID  . feeding supplement (GLUCERNA SHAKE)  237 mL Oral TID BM  . fluticasone furoate-vilanterol  1 puff Inhalation Daily  . insulin aspart  0-9 Units Subcutaneous TID PC & HS  . lipase/protease/amylase  72,000 Units Oral TID WC  . lisinopril  20 mg Oral BID  . metoprolol tartrate  25 mg Oral BID  . multivitamin with minerals  1 tablet Oral Daily  . pantoprazole  20 mg Oral BID  . pioglitazone  15 mg Oral Daily  . simvastatin  10 mg Oral QHS  . triamcinolone cream  1 application Topical BID   Continuous Infusions: . sodium chloride 75 mL/hr at 04/30/19 1400     LOS: 3 days   [name]  [facility]   *Please refer to [email or web address], password [email or web address] to get updated schedule on who will round on this patient, as hospitalists switch teams weekly. If 7PM-7AM, please contact night-coverage at [email or web address], password [email or web address] for any overnight  needs.  04/30/2019, 5:59 PM

## 2019-04-30 NOTE — Progress Notes (Signed)
[place], [place] 04/30/19  Subjective:   Hospital day # 3 Overall better. Na improved to 128 this morning   Renal: 01/21 0701 - 01/22 0700 In: 286.5 [I.V.:286.5] Out: 1400 [Urine:1400] Lab Results  Component Value Date   CREATININE 0.86 04/30/2019   CREATININE 0.94 04/29/2019   CREATININE 1.02 (H) 04/28/2019     Objective:  Vital signs in last 24 hours:  Temp:  [97.5 F (36.4 C)-98.3 F (36.8 C)] 98.3 F (36.8 C) (01/22 1213) Pulse Rate:  [54-66] 66 (01/22 1213) Resp:  [16-18] 18 (01/22 1213) BP: (91-132)/(51-61) 129/56 (01/22 1213) SpO2:  [98 %-100 %] 100 % (01/22 1213)  Weight change:  Filed Weights   04/27/19 1555  Weight: 93 kg    Intake/Output:    Intake/Output Summary (Last 24 hours) at 04/30/2019 1353 Last data filed at 04/30/2019 0602 Gross per 24 hour  Intake --  Output 700 ml  Net -700 ml    Physical Exam: General:  Sitting up in the chair, no acute distress  HEENT  anicteric, moist oral mucous membranes  Lungs:  Normal breathing effort, clear to auscultation  Heart::  Regular, no rub or gallop  Abdomen:  Soft, nontender  Extremities:  trace peripheral edema  Neurologic:  Alert, oriented  Skin:  chronic discoloration over legs      Basic Metabolic Panel:  Recent Labs  Lab 04/27/19 1603 04/27/19 1603 04/28/19 0510 04/28/19 0510 04/28/19 1541 04/29/19 0806 04/30/19 0451  NA 116*  --  122*  --  121* 123* 128*  K 4.4  --  3.7  --  3.8 4.0 4.6  CL 79*  --  85*  --  85* 89* 93*  CO2 24  --  28  --  24 24 26   GLUCOSE 172*  --  106*  --  220* 115* 127*  BUN 10  --  9  --  11 12 12   CREATININE 0.87  --  0.91  --  1.02* 0.94 0.86  CALCIUM 9.0   < > 9.0   < > 9.0 8.6* 8.9   < > = values in this interval not displayed.     CBC: Recent Labs  Lab 04/27/19 1603 04/28/19 0510  WBC 9.6 9.9  HGB 11.5* 11.1*  HCT 32.5* 31.3*  MCV 83.1 83.5  PLT 327 281     No results found for: HEPBSAG, HEPBSAB,  HEPBIGM    Microbiology:  Recent Results (from the past 240 hour(s))  C difficile quick scan w PCR reflex     Status: None   Collection Time: 04/28/19 11:39 AM   Specimen: Stool  Result Value Ref Range Status   C Diff antigen NEGATIVE NEGATIVE Final   C Diff toxin NEGATIVE NEGATIVE Final   C Diff interpretation No C. difficile detected.  Final    Comment: Performed at [facility], [address] [place], [place] [address]    Coagulation Studies: No results for input(s): LABPROT, INR in the last 72 hours.  Urinalysis: Recent Labs    04/27/19 1603  COLORURINE YELLOW*  LABSPEC 1.008  PHURINE 6.0  GLUCOSEU NEGATIVE  HGBUR NEGATIVE  BILIRUBINUR NEGATIVE  KETONESUR NEGATIVE  PROTEINUR NEGATIVE  NITRITE NEGATIVE  LEUKOCYTESUR NEGATIVE      Imaging: No results found.   Medications:   . sodium chloride 75 mL/hr at 04/30/19 0200   . amLODipine  10 mg Oral QHS  . apixaban  5 mg Oral BID  . feeding supplement (  GLUCERNA SHAKE)  237 mL Oral TID BM  . fluticasone furoate-vilanterol  1 puff Inhalation Daily  . insulin aspart  0-9 Units Subcutaneous TID PC & HS  . lipase/protease/amylase  72,000 Units Oral TID WC  . lisinopril  20 mg Oral BID  . metoprolol tartrate  25 mg Oral BID  . multivitamin with minerals  1 tablet Oral Daily  . pantoprazole  20 mg Oral BID  . pioglitazone  15 mg Oral Daily  . simvastatin  10 mg Oral QHS  . triamcinolone cream  1 application Topical BID   acetaminophen **OR** acetaminophen, HYDROmorphone (DILAUDID) injection, lipase/protease/amylase, magnesium hydroxide, ondansetron **OR** ondansetron (ZOFRAN) IV, traZODone  Assessment/ Plan:  74 y.o. female with   diabetes type 2, GERD, dyslipidemia, hypertension, chronic kidney disease, atrial fibrillation, IBS followed at [facility]    admitted on 04/27/2019 for Hyponatremia [E87.1] Loose stools [R19.5]  1.  Hyponatremia Likely secondary to volume depletion from  hydrochlorothiazide in setting of nausea and vomiting Patient's sodium was 137 back in December 2020.  Since then it has been in the low range - discontinue HCTZ- do not restart - Na improved to 128 with IV saline - OK to d/c from renal standpoint if afternoon Na is improved  2.  Chronic kidney disease, unspecified Patient is followed as outpatient by our practice by Dr. Cherylann Ratel.  Baseline creatinine of 0.87, GFR greater than 60     LOS: 3 Marella Vanderpol 1/22/20211:53 PM  [facility] [place], [place] [phone]  Note: This note was prepared with Dragon dictation. Any transcription errors are unintentional

## 2019-05-01 LAB — BASIC METABOLIC PANEL
Anion gap: 9 (ref 5–15)
BUN: 13 mg/dL (ref 8–23)
CO2: 25 mmol/L (ref 22–32)
Calcium: 8.8 mg/dL — ABNORMAL LOW (ref 8.9–10.3)
Chloride: 101 mmol/L (ref 98–111)
Creatinine, Ser: 0.73 mg/dL (ref 0.44–1.00)
GFR calc Af Amer: >60 mL/min (ref >60)
GFR calc non Af Amer: >60 mL/min (ref >60)
Glucose, Bld: 123 mg/dL — ABNORMAL HIGH (ref 70–99)
Potassium: 4.5 mmol/L (ref 3.5–5.1)
Sodium: 135 mmol/L (ref 135–145)

## 2019-05-01 LAB — GLUCOSE, CAPILLARY: Glucose-Capillary: 123 mg/dL — ABNORMAL HIGH (ref 70–99)

## 2019-05-01 NOTE — Plan of Care (Signed)
Discharge teaching completed with patient who verbalized understanding of teaching, medications, and follow-up appointments.  Patient left in stable condition with all belongings.  

## 2019-05-01 NOTE — Discharge Summary (Signed)
Physician Discharge Summary  Beth Tyler GQQ:[id number] DOB: 01/05/1946 DOA: 04/27/2019  PCP: Dale [place], MD  Admit date: 04/27/2019 Discharge date: 05/01/2019  Discharge disposition: Home   Recommendations for Outpatient Follow-Up:   Follow up with PCP in 1 week   Discharge Diagnosis:   Active Problems:   Hyponatremia    Discharge Condition: Stable.  Diet recommendation: Low salt diet  Code status: Full code.    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  She developed worsening diarrhea about 3 to 4 days prior to admission.  This was associated with abdominal pain and fatigue.  She was found to have acute hyponatremia.  She was taking hydrochlorothiazide for hypertension prior to admission.  HCTZ was discontinued because of hyponatremia and also because of concern that it may be related to pancreatic insufficiency.  She was treated with IV fluids and hyponatremia has resolved.  Her blood pressure was on the low side so antihypertensives were discontinued at discharge.  Her condition has improved and she is deemed stable for discharge to home.  She was advised to follow-up with monitor her blood pressure and for antihypertensives to be adjusted accordingly.    Medical Consultants:    Nephrology   Discharge Exam:   Vitals:   05/01/19 0403 05/01/19 0800  BP: (!) 106/56 (!) 114/52  Pulse: (!) 54 62  Resp: 18   Temp: 97.7 F (36.5 C)   SpO2: 98%    Vitals:   04/30/19 1213 04/30/19 1950 05/01/19 0403 05/01/19 0800  BP: (!) 129/56 (!) 126/50 (!) 106/56 (!) 114/52  Pulse: 66 62 (!) 54 62   Resp: 18 20 18    Temp: 98.3 F (36.8 C) 98.2 F (36.8 C) 97.7 F (36.5 C)   TempSrc: Oral Oral Oral   SpO2: 100% 100% 98%   Weight:      Height:         GEN: NAD SKIN: No rash EYES: EOMI ENT: MMM CV: RRR PULM: CTA B ABD: soft, obese, NT, +BS CNS: AAO x 3, non focal EXT: No edema or tenderness   The results of significant diagnostics from this hospitalization (including imaging, microbiology, ancillary and laboratory) are listed below for reference.     Procedures and Diagnostic Studies:   No results found.   Labs:   Basic Metabolic Panel: Recent Labs  Lab 04/28/19 1541 04/28/19 1541 04/29/19 0806 04/29/19 0806 04/30/19 0451 04/30/19 0451 04/30/19 1411 05/01/19 0650  NA 121*  --  123*  --  128*  --  130* 135  K 3.8   < > 4.0   < > 4.6   < > 4.7 4.5  CL 85*  --  89*  --  93*  --  96* 101  CO2 24  --  24  --  26  --  24 25  GLUCOSE 220*  --  115*  --  127*  --  186* 123*  BUN 11  --  12  --  12  --  12 13  CREATININE 1.02*  --  0.94  --  0.86  --  0.81 0.73  CALCIUM 9.0  --  8.6*  --  8.9  --  9.0 8.8*   < > = values in this interval not displayed.   GFR Estimated Creatinine Clearance: 65.5 mL/min (by C-G formula based on SCr of 0.73 mg/dL). Liver Function Tests: Recent Labs  Lab 04/27/19 1603  AST 22  ALT 19  ALKPHOS 60  BILITOT 1.1  PROT 7.1  ALBUMIN 3.9   Recent Labs  Lab 04/27/19 1603  LIPASE 22   No results for input(s): AMMONIA in the last 168 hours. Coagulation profile No results for input(s): INR, PROTIME in the last 168 hours.  CBC: Recent Labs  Lab 04/27/19 1603 04/28/19 0510  WBC 9.6 9.9  HGB 11.5* 11.1*  HCT 32.5* 31.3*  MCV 83.1 83.5  PLT 327 281   Cardiac Enzymes: No results for input(s): CKTOTAL, CKMB, CKMBINDEX, TROPONINI in the last 168 hours. BNP: Invalid input(s): POCBNP CBG: Recent Labs  Lab 04/30/19 0746 04/30/19 1210 04/30/19 1631 04/30/19 2203 05/01/19 0755  GLUCAP 126* 173* 150* 203* 123*    D-Dimer No results for input(s): DDIMER in the last 72 hours. Hgb A1c No results for input(s): HGBA1C in the last 72 hours. Lipid Profile No results for input(s): CHOL, HDL, LDLCALC, TRIG, CHOLHDL, LDLDIRECT in the last 72 hours. Thyroid function studies No results for input(s): TSH, T4TOTAL, T3FREE, THYROIDAB in the last 72 hours.  Invalid input(s): FREET3 Anemia work up No results for input(s): VITAMINB12, FOLATE, FERRITIN, TIBC, IRON, RETICCTPCT in the last 72 hours. Microbiology Recent Results (from the past 240 hour(s))  C difficile quick scan w PCR reflex     Status: None   Collection Time: 04/28/19 11:39 AM   Specimen: Stool  Result Value Ref Range Status   C Diff antigen NEGATIVE NEGATIVE Final   C Diff toxin NEGATIVE NEGATIVE Final   C Diff interpretation No C. difficile detected.  Final    Comment: Performed at [facility], [address] [place], [place] [address]     Discharge Instructions:   Discharge Instructions    Diet - low sodium heart healthy   Complete by: As directed    Increase activity slowly   Complete by: As directed      Allergies as of 05/01/2019      Reactions   Hydrocodone-acetaminophen Anaphylaxis   Percocet [oxycodone-acetaminophen] Anaphylaxis   Demeclocycline Nausea Only   Penicillins Nausea Only   Did it involve swelling of the face/tongue/throat, SOB, or low BP? No Did it involve sudden or severe rash/hives, skin peeling, or any reaction on the inside of your mouth or nose? No Did you need to seek medical attention at a hospital or doctor's office? No When did it last happen? If all above answers are "NO", may proceed with cephalosporin use.   Tetracyclines & Related Nausea Only      Medication List    STOP taking these medications   amLODipine 10 MG tablet Commonly known as: NORVASC   hydrochlorothiazide 25 MG tablet Commonly known as: HYDRODIURIL   lisinopril 20 MG tablet Commonly known as: ZESTRIL    metoprolol tartrate 25 MG tablet Commonly known as: LOPRESSOR     TAKE these medications   acetaminophen 500 MG tablet Commonly known as: TYLENOL Take 500-1,000 mg by mouth every 6 (six) hours as needed for mild pain or moderate pain.   apixaban 5 MG Tabs tablet Commonly known as: ELIQUIS Take 1 tablet (5 mg total) by mouth 2 (two) times daily.   Basaglar KwikPen 100 UNIT/ML Sopn Inject 0.28 mLs (28 Units total) into the  skin every morning.   fluticasone 0.05 % cream Commonly known as: CUTIVATE Apply 1 application topically as needed. Use as directed.   fluticasone furoate-vilanterol 200-25 MCG/INH Aepb Commonly known as: Breo Ellipta Inhale 1 puff into the lungs daily.   insulin lispro 100 UNIT/ML injection Commonly known as: [name] Inject 0.04-0.08 mLs (4-8 Units total) into the skin 3 (three) times daily with meals.   Insulin Pen Needle 32G X 4 MM Misc Use to inject insulin three times daily   lipase/protease/amylase 77824 UNITS Cpep capsule Commonly known as: [name] Take 36,000-72,000 Units by mouth See admin instructions. Take 2 capsules (72000 units) by mouth three times daily with meals and take 1 capsule (36000 units) by mouth daily with snacks   magnesium oxide 400 (241.3 Mg) MG tablet Commonly known as: MAG-OX Take 1 tablet by mouth once daily   metFORMIN 500 MG 24 hr tablet Commonly known as: GLUCOPHAGE-XR Take 1 tablet (500 mg total) by mouth 2 (two) times daily. What changed: how much to take   OneTouch Verio test strip Generic drug: glucose blood USE 1 STRIP TO CHECK GLUCOSE TWICE DAILY What changed: See the new instructions.   pantoprazole 40 MG tablet Commonly known as: PROTONIX Take 1 tablet (40 mg total) by mouth 2 (two) times daily.   pioglitazone 15 MG tablet Commonly known as: ACTOS Take 1 tablet by mouth once daily   simvastatin 10 MG tablet Commonly known as: ZOCOR TAKE 1 TABLET BY MOUTH AT BEDTIME   triamcinolone cream 0.1  % Commonly known as: KENALOG APPLY  CREAM TOPICALLY TWICE DAILY What changed:   how much to take  how to take this  when to take this  additional instructions         Time coordinating discharge: 28 minutes  Signed:  Faithann Natal  [facility] 05/01/2019, 9:30 AM

## 2019-05-01 NOTE — Plan of Care (Signed)
Continuing with plan of care. 

## 2019-05-01 NOTE — Progress Notes (Signed)
[place], [place] 05/01/19  Subjective:  Late entry. Patient seen prior to discharge. Serum sodium up to 135 today. Patient overall feeling much better.   Objective:  Vital signs in last 24 hours:  Temp:  [97.7 F (36.5 C)-98.2 F (36.8 C)] 98.1 F (36.7 C) (01/23 1211) Pulse Rate:  [54-64] 64 (01/23 1211) Resp:  [18-20] 18 (01/23 1211) BP: (106-126)/(50-62) 126/62 (01/23 1211) SpO2:  [98 %-100 %] 99 % (01/23 1211)  Weight change:  Filed Weights   04/27/19 1555  Weight: 93 kg    Intake/Output:    Intake/Output Summary (Last 24 hours) at 05/01/2019 1433 Last data filed at 04/30/2019 1800 Gross per 24 hour  Intake 282.99 ml  Output --  Net 282.99 ml    Physical Exam: General:  Sitting up in the chair, no acute distress  HEENT  anicteric, moist oral mucous membranes  Lungs:  Normal breathing effort, clear to auscultation  Heart::  Regular, no rub or gallop  Abdomen:  Soft, nontender  Extremities:  trace peripheral edema  Neurologic:  Alert, oriented  Skin:  chronic discoloration over legs      Basic Metabolic Panel:  Recent Labs  Lab 04/28/19 1541 04/28/19 1541 04/29/19 0806 04/29/19 0806 04/30/19 0451 04/30/19 1411 05/01/19 0650  NA 121*  --  123*  --  128* 130* 135  K 3.8  --  4.0  --  4.6 4.7 4.5  CL 85*  --  89*  --  93* 96* 101  CO2 24  --  24  --  26 24 25   GLUCOSE 220*  --  115*  --  127* 186* 123*  BUN 11  --  12  --  12 12 13   CREATININE 1.02*  --  0.94  --  0.86 0.81 0.73  CALCIUM 9.0   < > 8.6*   < > 8.9 9.0 8.8*   < > = values in this interval not displayed.     CBC: Recent Labs  Lab 04/27/19 1603 04/28/19 0510  WBC 9.6 9.9  HGB 11.5* 11.1*  HCT 32.5* 31.3*  MCV 83.1 83.5  PLT 327 281     No results found for: HEPBSAG, HEPBSAB, HEPBIGM    Microbiology:  Recent Results (from the past 240 hour(s))  C difficile quick scan w PCR reflex     Status: None   Collection Time: 04/28/19 11:39 AM    Specimen: Stool  Result Value Ref Range Status   C Diff antigen NEGATIVE NEGATIVE Final   C Diff toxin NEGATIVE NEGATIVE Final   C Diff interpretation No C. difficile detected.  Final    Comment: Performed at [facility], [address] [place], [place] [address]    Coagulation Studies: No results for input(s): LABPROT, INR in the last 72 hours.  Urinalysis: No results for input(s): COLORURINE, LABSPEC, PHURINE, GLUCOSEU, HGBUR, BILIRUBINUR, KETONESUR, PROTEINUR, UROBILINOGEN, NITRITE, LEUKOCYTESUR in the last 72 hours.  Invalid input(s): APPERANCEUR    Imaging: No results found.   Medications:    . amLODipine  10 mg Oral QHS  . apixaban  5 mg Oral BID  . feeding supplement (GLUCERNA SHAKE)  237 mL Oral TID BM  . fluticasone furoate-vilanterol  1 puff Inhalation Daily  . insulin aspart  0-9 Units Subcutaneous TID PC & HS  . lipase/protease/amylase  72,000 Units Oral TID WC  . lisinopril  20 mg Oral BID  . metoprolol tartrate  25 mg Oral BID  .  multivitamin with minerals  1 tablet Oral Daily  . pantoprazole  20 mg Oral BID  . pioglitazone  15 mg Oral Daily  . simvastatin  10 mg Oral QHS  . triamcinolone cream  1 application Topical BID   acetaminophen **OR** acetaminophen, HYDROmorphone (DILAUDID) injection, lipase/protease/amylase, magnesium hydroxide, ondansetron **OR** ondansetron (ZOFRAN) IV, traZODone  Assessment/ Plan:  74 y.o. female with   diabetes type 2, GERD, dyslipidemia, hypertension, chronic kidney disease, atrial fibrillation, IBS followed at [facility] GI    admitted on 04/27/2019 for Hyponatremia [E87.1] Loose stools [R19.5]  1.  Hyponatremia Likely secondary to volume depletion from hydrochlorothiazide in setting of nausea and vomiting Patient's sodium was 137 back in December 2020.  Since then it has been in the low range -Sodium now up to 135.  Patient doing much better.  Avoid HCTZ in the future.  2.  Chronic kidney disease,  unspecified It appears overall renal function improved.  Creatinine now 0.73 with an EGFR greater than 60.  We will continue to monitor as an outpatient.  3.  Hypertension.  Maintain the patient on lisinopril and amlodipine, and avoid HCTZ.     LOS: 4 Beth Tyler 1/23/20212:33 PM  [place], [place]  Note: This note was prepared with Dragon dictation. Any transcription errors are unintentional

## 2019-05-02 LAB — GI PATHOGEN PANEL BY PCR, STOOL

## 2019-05-03 ENCOUNTER — Telehealth: Payer: Self-pay | Admitting: Internal Medicine

## 2019-05-03 ENCOUNTER — Other Ambulatory Visit: Payer: Self-pay | Admitting: Pharmacy Technician

## 2019-05-03 ENCOUNTER — Telehealth: Payer: Self-pay

## 2019-05-03 ENCOUNTER — Other Ambulatory Visit: Payer: Self-pay | Admitting: Internal Medicine

## 2019-05-03 ENCOUNTER — Ambulatory Visit (INDEPENDENT_AMBULATORY_CARE_PROVIDER_SITE_OTHER): Payer: Medicare HMO | Admitting: Pharmacist

## 2019-05-03 DIAGNOSIS — I480 Paroxysmal atrial fibrillation: Secondary | ICD-10-CM

## 2019-05-03 DIAGNOSIS — Z794 Long term (current) use of insulin: Secondary | ICD-10-CM

## 2019-05-03 DIAGNOSIS — E139 Other specified diabetes mellitus without complications: Secondary | ICD-10-CM

## 2019-05-03 DIAGNOSIS — I10 Essential (primary) hypertension: Secondary | ICD-10-CM

## 2019-05-03 DIAGNOSIS — E1140 Type 2 diabetes mellitus with diabetic neuropathy, unspecified: Secondary | ICD-10-CM

## 2019-05-03 DIAGNOSIS — E871 Hypo-osmolality and hyponatremia: Secondary | ICD-10-CM

## 2019-05-03 NOTE — Telephone Encounter (Signed)
Patient called back, noted that it was also recommended that she get weekly sodium levels checked after discharge. Wanted to see if Dr. Lorin Picket wanted her to get these tomorrow prior to Wednesday's appointment.   Will collaborate w/ Dr. Lorin Picket on this

## 2019-05-03 NOTE — Telephone Encounter (Signed)
Transition Care Management Follow-up Telephone Call  Date of discharge and from where: 05/01/19 from [facility]  How have you been since you were released from the hospital? I am doing okay, no new symptoms presenting. Denies ABD pain, nausea, vomiting, fever, diarrhea. Eating/drinking without issues. Voiding/BM okay. Reports loose stools as "normal for her." Patient states, "Since I had the covid a month ago I have had more nervousness and have been tired." Heart rate high 50s.   Any questions or concerns? None new at this time.  Report given to Vanice Sarah [facility] of concerns regarding fluid intake.    Items Reviewed:  Did the pt receive and understand the discharge instructions provided? Patient states she was told to drink gatorade and ensure daily. Increase activity as tolerated.   Medications obtained and verified? Amlodipine, lisinopril, HCTZ stopped. Continues to take metoprolol. Taking all other scheduled medications as previously directed.   Any new allergies since your discharge?   Dietary orders reviewed? Low sodium, heart healthy.   Do you have support at home? Yes, son and his family lives with her.   Functional Questionnaire: (I = Independent and D = Dependent) ADLs: I  Bathing/Dressing- I  Meal Prep- I  Eating- I  Maintaining continence- I  Transferring/Ambulation- I  Managing Meds- I  Follow up appointments reviewed:   PCP Hospital f/u appt confirmed?  Scheduled to see Dr. Lorin Picket on 05/07/19 @ 10:00.  CCM Pharmacist completed?  Yes, see encounter with Vanice Sarah [facility], on 05/03/19.  Are transportation arrangements needed? No  If their condition worsens, is the pt aware to call PCP or go to the Emergency Dept.? Yes  Was the patient provided with contact information for the PCP's office or ED? Yes  Was to pt encouraged to call back with questions or concerns? Yes

## 2019-05-03 NOTE — Telephone Encounter (Signed)
Patient needs a hospital follow up with Dr. Lorin Picket. There are no appointments available. Patient stated the hospital change her medications.

## 2019-05-03 NOTE — Telephone Encounter (Signed)
Noted. Will follow.  

## 2019-05-03 NOTE — Chronic Care Management (AMB) (Signed)
Chronic Care Management   Follow Up Note   05/03/2019 Name: Beth Tyler MRN: [id number] DOB: [birth year]  Referred by: Dale [place], MD Reason for referral : Chronic Care Management (Medication Management)   Beth Tyler is a 74 y.o. year old female who is a primary care patient of Dale [place], MD. The CCM team was consulted for assistance with chronic disease management and care coordination needs.    Contacted patient for regularly scheduled medication management review today, however, has acute post-discharge questions and concerns.  Review of patient status, including review of consultants reports, relevant laboratory and other test results, and collaboration with appropriate care team members and the patient's provider was performed as part of comprehensive patient evaluation and provision of chronic care management services.    SDOH (Social Determinants of Health) screening performed today: Stress. See Care Plan for related entries.   Outpatient Encounter Medications as of 05/03/2019  Medication Sig Note  . apixaban (ELIQUIS) 5 MG TABS tablet Take 1 tablet (5 mg total) by mouth 2 (two) times daily.   . Insulin Glargine (BASAGLAR KWIKPEN) 100 UNIT/ML SOPN Inject 0.28 mLs (28 Units total) into the skin every morning. 05/03/2019: 24 units   . insulin lispro ([name]) 100 UNIT/ML injection Inject 0.04-0.08 mLs (4-8 Units total) into the skin 3 (three) times daily with meals. 05/03/2019: Using 2-3 units right now  . lipase/protease/amylase ([name]) 36000 UNITS CPEP capsule Take 36,000-72,000 Units by mouth See admin instructions. Take 2 capsules (72000 units) by mouth three times daily with meals and take 1 capsule (36000 units) by mouth daily with snacks   . metoprolol tartrate (LOPRESSOR) 25 MG tablet Take 25 mg by mouth 2 (two) times daily.   Marland Kitchen acetaminophen (TYLENOL) 500 MG tablet Take 500-1,000 mg by mouth every 6 (six) hours as needed for mild pain or moderate pain.    .  fluticasone (CUTIVATE) 0.05 % cream Apply 1 application topically as needed. Use as directed.   . fluticasone furoate-vilanterol (BREO ELLIPTA) 200-25 MCG/INH AEPB Inhale 1 puff into the lungs daily.   . Insulin Pen Needle 32G X 4 MM MISC Use to inject insulin three times daily   . magnesium oxide (MAG-OX) 400 (241.3 Mg) MG tablet Take 1 tablet by mouth once daily (Patient taking differently: Take 400 mg by mouth daily. )   . metFORMIN (GLUCOPHAGE-XR) 500 MG 24 hr tablet Take 1 tablet (500 mg total) by mouth 2 (two) times daily. (Patient taking differently: Take 1,000 mg by mouth 2 (two) times daily. )   . ONETOUCH VERIO test strip USE 1 STRIP TO CHECK GLUCOSE TWICE DAILY (Patient taking differently: 1 each by Other route 2 (two) times daily. )   . pantoprazole (PROTONIX) 40 MG tablet Take 1 tablet (40 mg total) by mouth 2 (two) times daily.   . pioglitazone (ACTOS) 15 MG tablet Take 1 tablet by mouth once daily (Patient taking differently: Take 15 mg by mouth daily. )   . simvastatin (ZOCOR) 10 MG tablet TAKE 1 TABLET BY MOUTH AT BEDTIME (Patient taking differently: Take 10 mg by mouth at bedtime. )   . triamcinolone cream (KENALOG) 0.1 % APPLY  CREAM TOPICALLY TWICE DAILY (Patient taking differently: Apply 1 application topically 2 (two) times daily. )    No facility-administered encounter medications on file as of 05/03/2019.     Objective:   Goals Addressed            This Visit's Progress     Patient  Stated   . "I can't afford my medications" (pt-stated)       Current Barriers:  . Diabetes: uncontrolled though improved per BG; most recent A1c 7.8%, due, but has labwork and appointment scheduled w/ PCP in December  o Patient extremely concerned about recent fluctuations in her health.  - Hospitalized 12/10-12/14 for new onset Afib + COVID - ED 12/27-12/28 for bradycardia, noted to be hyponatriemic to 125 - ED visit 1/10-1/11 for feeling faint, noted to be hyponatremic to 131; patient  reports that after this episode, she stopped HCTZ, though I do not see this noted in documentation from that visit - Hospitalization 1/19-1/23 for hyponatremia again; per notes, appears to be thought it was related to HCTZ + worsened N/V/D, however, patient reports that she was not taking HCTZ, and her bowel symptoms were "no different than normal" o Reports taking [name] w/ meals, and this has improved consistency and frequency of stools, but still has some episodes of loose stool, though more often constipation. Notes no change in GI "pain" since starting [name] o Has been checking vitals at home since discharge. Has only been taking metoprolol tartrate since discharge, though AVS noted to d/c amlodipine, lisinopril, HCTZ, and metoprolol. Notes she was afraid to d/c metoprolol d/t recent Afib diagnosis.  - Saturday 1/23 9 pm: 132/67 HR 69 O2 97% BG 179 - Sunday 1/24: 115/55 HR 57 BG 137; PM 140/68, HR 64, BG 126 - Monday: 7 am 160/68, HR 65 BG 122; 12:30 pm today 153/66 HR 62, BG 136 (after breakfast)  o This morning had two episodes of loose stool at 10 and 10:30, but hasn't gone since; historically has always had loose stools after meals, but since [name], doesn't always happen as frequently; hence why she describes continued occasional loose stools as "normal" . Current antihyperglycemic regimen: Pioglitazone 15 mg daily, Basaglar 24 units daily, [name] 2-3  o GLP1 not a option d/t significant IBS. Renal function low for SGLT2 to provide glycemic benefit . Cardiovascular risk reduction: o Current hypertensive regimen: amlodipine 10 mg QPM, carvedilol 25 mg BID, HCTZ 25 mg QAM, lisinopril 20 mg BID;  o Current hyperlipidemia regimen: simvastatin 10 mg daily; LDL at goal <70 on last check . Atrial Fibrillation: Eliquis 5 mg BID; received free 30 day supply copay card, and is concerned about the expense moving forward if she is to remain on this therapy chronically. Notes that she has follow up with  cardiology on 12/29  Pharmacist Clinical Goal(s):  Marland Kitchen Over the next 90 days, patient with work with PharmD and primary care provider to address optimized glycemic management  Interventions: . Will collaborate w/ Dr. Nicki Reaper to determine recommendations moving forward . Encouraged patient to schedule f/u with GI; she notes that she is waiting for them to call her back with an appointment, as she was supposed to see them more recently but was hospitalized . Patient also notes that she was told to drink Gatorade, but is unsure how much/how often. Has Gatorade Zero.   Patient Self Care Activities:  . Patient will check blood glucose QID, document, and provide at future appointments . Patient will take medications as prescribed . Patient will report any questions or concerns to provider   Please see past updates related to this goal by clicking on the "Past Updates" button in the selected goal          Plan:  - Will collaborate w/ Dr. Nicki Reaper as above - Scheduled f/u call 05/31/19  Catie Darnelle Maffucci, PharmD, [place], CPP Clinical Pharmacist [place] [phone]

## 2019-05-03 NOTE — Patient Outreach (Signed)
[facility]) Care Management  05/03/2019  Beth Tyler 16-Apr-1945 [id number]    Successful call placed to patient regarding patient assistance application(s) for Basaglar and [name] with [name] , HIPAA identifiers verified.   Patient informed she received the application and mailed it back last week.  Follow up:  Will route note to embedded [facility] RPh Catie Feliz Beam for case closure if document(s) have not been received in the next 15 business days.  Beth Tyler P. [name], CPhT [facility] Care Management [phone]

## 2019-05-03 NOTE — Telephone Encounter (Signed)
Patient extremely concerned about BP control and prevention of another admission for hypoglycemia, and would like direction on what to do moving forward, prior to currently scheduled appt w/ Dr. Lorin Picket next week. Copied from CCM note today   Also notes that she was told to "drink Gatorade" but she would like instruction about how much/how frequently to drink.   - Hospitalized 12/10-12/14 for new onset Afib + COVID - ED 12/27-12/28 for bradycardia, noted to be hyponatriemic to 125 - ED visit 1/10-1/11 for feeling faint, noted to be hyponatremic to 131; patient reports that after this episode, she stopped HCTZ, though I do not see this noted in documentation from that visit - Hospitalization 1/19-1/23 for hyponatremia again; per notes, appears to be thought it was related to HCTZ + worsened N/V/D, however, patient reports that she was not taking HCTZ since 1/11, and her bowel symptoms were "no different than normal"  Has been checking vitals at home since discharge. Has only been taking metoprolol tartrate since discharge, though AVS noted to d/c amlodipine, lisinopril, HCTZ, and metoprolol. Notes she was afraid to d/c metoprolol d/t recent Afib diagnosis. Discharge summary is not complete, but last nephrology consult note mentioned continuing losartan and amlodipine, but d/c HCTZ - Saturday 1/23 9 pm: 132/67 HR 69 O2 97% BG 179 - Sunday 1/24: 115/55 HR 57 BG 137; PM 140/68, HR 64, BG 126 - Monday: 7 am 160/68, HR 65 BG 122; 12:30 pm today 153/66 HR 62, BG 136 (after breakfast)    Routing to Dr. Lorin Picket and LPN for support

## 2019-05-03 NOTE — Progress Notes (Signed)
Reviewed information.  Follow up appt scheduled with me 05/05/19. Will review hospital records, discharge summary, medications, etc.  D/w pt.    Dr Lorin Picket

## 2019-05-03 NOTE — Telephone Encounter (Signed)
Reviewed hospitalization.  See other phone note.  Schedule for f/u met b today.

## 2019-05-03 NOTE — Progress Notes (Signed)
Order placed for f/u labs.  

## 2019-05-03 NOTE — Telephone Encounter (Signed)
Reviewed records.  Have her continue to spot check her pressure.  If remains elevated, restart amlodipine.  Agree with continuing metoprolol.  [place] with recheck met b prior to appt.

## 2019-05-03 NOTE — Patient Instructions (Signed)
Visit Information  Goals Addressed            This Visit's Progress     Patient Stated   . "I can't afford my medications" (pt-stated)       Current Barriers:  . Diabetes: uncontrolled though improved per BG; most recent A1c 7.8%, due, but has labwork and appointment scheduled w/ PCP in December  o Patient extremely concerned about recent fluctuations in her health.  - Hospitalized 12/10-12/14 for new onset Afib + COVID - ED 12/27-12/28 for bradycardia, noted to be hyponatriemic to 125 - ED visit 1/10-1/11 for feeling faint, noted to be hyponatremic to 131; patient reports that after this episode, she stopped HCTZ, though I do not see this noted in documentation from that visit - Hospitalization 1/19-1/23 for hyponatremia again; per notes, appears to be thought it was related to HCTZ + worsened N/V/D, however, patient reports that she was not taking HCTZ, and her bowel symptoms were "no different than normal" o Reports taking [name] w/ meals, and this has improved consistency and frequency of stools, but still has some episodes of loose stool, though more often constipation. Notes no change in GI "pain" since starting [name] o Has been checking vitals at home since discharge. Has only been taking metoprolol tartrate since discharge, though AVS noted to d/c amlodipine, lisinopril, HCTZ, and metoprolol. Notes she was afraid to d/c metoprolol d/t recent Afib diagnosis.  - Saturday 1/23 9 pm: 132/67 HR 69 O2 97% BG 179 - Sunday 1/24: 115/55 HR 57 BG 137; PM 140/68, HR 64, BG 126 - Monday: 7 am 160/68, HR 65 BG 122; 12:30 pm today 153/66 HR 62, BG 136 (after breakfast)  o This morning had two episodes of loose stool at 10 and 10:30, but hasn't gone since; historically has always had loose stools after meals, but since [name], doesn't always happen as frequently; hence why she describes continued occasional loose stools as "normal" . Current antihyperglycemic regimen: Pioglitazone 15 mg daily,  Basaglar 24 units daily, [name] 2-3  o GLP1 not a option d/t significant IBS. Renal function low for SGLT2 to provide glycemic benefit . Cardiovascular risk reduction: o Current hypertensive regimen: amlodipine 10 mg QPM, carvedilol 25 mg BID, HCTZ 25 mg QAM, lisinopril 20 mg BID;  o Current hyperlipidemia regimen: simvastatin 10 mg daily; LDL at goal <70 on last check . Atrial Fibrillation: Eliquis 5 mg BID; received free 30 day supply copay card, and is concerned about the expense moving forward if she is to remain on this therapy chronically. Notes that she has follow up with cardiology on 12/29  Pharmacist Clinical Goal(s):  Marland Kitchen Over the next 90 days, patient with work with PharmD and primary care provider to address optimized glycemic management  Interventions: . Will collaborate w/ Dr. Lorin Picket to determine recommendations moving forward . Encouraged patient to schedule f/u with GI; she notes that she is waiting for them to call her back with an appointment, as she was supposed to see them more recently but was hospitalized . Patient also notes that she was told to drink Gatorade, but is unsure how much/how often. Has Gatorade Zero.   Patient Self Care Activities:  . Patient will check blood glucose QID, document, and provide at future appointments . Patient will take medications as prescribed . Patient will report any questions or concerns to provider   Please see past updates related to this goal by clicking on the "Past Updates" button in the selected goal  The patient verbalized understanding of instructions provided today and declined a print copy of patient instruction materials.  Plan:  - Will collaborate w/ Dr. Nicki Reaper as above - Scheduled f/u call 05/31/19  Catie Darnelle Maffucci, PharmD, BCACP, [place] Pharmacist [place] [phone]

## 2019-05-04 ENCOUNTER — Other Ambulatory Visit: Payer: Self-pay | Admitting: *Deleted

## 2019-05-04 ENCOUNTER — Telehealth: Payer: Self-pay

## 2019-05-04 NOTE — Telephone Encounter (Signed)
Pt called current BP 203/89 HR 102. Denies CP, no vision changes, SOB, no arm numbness or tingling, no HA. Patient stated that she just does not feel quite right & feels nervous. She is only taking metoprolol & wondered if she should start back her other BP meds. She wanted to know if she should take lisinopril 20 mg? She said it jumped up a lot because at 10am it was 155/77 HR 70. She said that she just got over Covid & was having aFib episodes while in the hospital.   I had her check again when she was on the phone with me & she said that she could tell it had come back down. BP was 178/84 HR 84. I advised that I would sent to you but if she developed CP, SOB, HA, arm pain/numbness/tingling that she needed to proceed to ED.

## 2019-05-04 NOTE — Telephone Encounter (Signed)
Pt notified & states that someone told her she could go ahead & take the Amlodipine so she did.  Her BP was 147/76 @ 2:40pm. Took Amlodipine around 2:10pm.  She will come in for labs after her visit with you tomorrow (05/05/19)

## 2019-05-04 NOTE — Patient Outreach (Signed)
Red EMMI flag general discharge received for 05/03/19 Day#1, Questions discharge papers- Yes.  Know who to call regarding change in condition- No.  RN CM spoke with pt to address issues, pt states she does know who to call for change in condition and has spoken with her primary care provider today about being off blood pressure medication and there was increase in BP, pt is now back on amlodipine and pt to see primary care provider tomorrow 05/05/19.  Pt states " I have no questions now regarding discharge instructions, the doctor answered my questions.    Irving Shows [facility], BSN [facility] Community Care Coordinator [phone]

## 2019-05-04 NOTE — Telephone Encounter (Signed)
I called and confirmed with patient that she has only taken metoprolol today. I advised her to start back amlodipine & check BP about a hour afterwards to make sure that numbers are coming down. Pt again confirmed no CP, HA, vision changes, SOB, arm tingling or numbness. She said that her nervous/jittery feeling has also gone away. I told her if any acute symptoms arose she would need to go to ED ASAP to be evaluated. Pt verbalized understanding.

## 2019-05-04 NOTE — Telephone Encounter (Signed)
Reviewed. Blood pressure trending down.

## 2019-05-04 NOTE — Telephone Encounter (Signed)
Confirm she is only taking metoprolol.  I would recommend for her to restart amlodipine.  Follow pressures. She has an appt with me tomorrow.  If persistent increase, will add back lisinopril.  Any acute change or symptoms, she needs to be evaluated.  Confirm again no chest pain, sob, etc.

## 2019-05-05 ENCOUNTER — Other Ambulatory Visit: Payer: Self-pay

## 2019-05-05 ENCOUNTER — Ambulatory Visit (INDEPENDENT_AMBULATORY_CARE_PROVIDER_SITE_OTHER): Payer: Medicare HMO | Admitting: Internal Medicine

## 2019-05-05 ENCOUNTER — Telehealth: Payer: Self-pay

## 2019-05-05 ENCOUNTER — Other Ambulatory Visit
Admission: RE | Admit: 2019-05-05 | Discharge: 2019-05-05 | Disposition: A | Discharge status: Home / Self Care | Payer: Medicare HMO | Attending: Internal Medicine | Admitting: Internal Medicine

## 2019-05-05 VITALS — BP 147/76 | HR 75 | Ht 62.0 in | Wt 209.0 lb

## 2019-05-05 DIAGNOSIS — E871 Hypo-osmolality and hyponatremia: Secondary | ICD-10-CM | POA: Diagnosis not present

## 2019-05-05 DIAGNOSIS — I480 Paroxysmal atrial fibrillation: Secondary | ICD-10-CM

## 2019-05-05 DIAGNOSIS — J849 Interstitial pulmonary disease, unspecified: Secondary | ICD-10-CM

## 2019-05-05 DIAGNOSIS — E538 Deficiency of other specified B group vitamins: Secondary | ICD-10-CM | POA: Diagnosis not present

## 2019-05-05 DIAGNOSIS — R300 Dysuria: Secondary | ICD-10-CM

## 2019-05-05 DIAGNOSIS — I700 Atherosclerosis of aorta: Secondary | ICD-10-CM | POA: Diagnosis not present

## 2019-05-05 DIAGNOSIS — E1165 Type 2 diabetes mellitus with hyperglycemia: Secondary | ICD-10-CM | POA: Diagnosis not present

## 2019-05-05 DIAGNOSIS — K219 Gastro-esophageal reflux disease without esophagitis: Secondary | ICD-10-CM

## 2019-05-05 DIAGNOSIS — U071 COVID-19: Secondary | ICD-10-CM | POA: Diagnosis not present

## 2019-05-05 DIAGNOSIS — I10 Essential (primary) hypertension: Secondary | ICD-10-CM

## 2019-05-05 LAB — BASIC METABOLIC PANEL
Anion gap: 12 (ref 5–15)
BUN: 16 mg/dL (ref 8–23)
CO2: 24 mmol/L (ref 22–32)
Calcium: 9.2 mg/dL (ref 8.9–10.3)
Chloride: 99 mmol/L (ref 98–111)
Creatinine, Ser: 1.13 mg/dL — ABNORMAL HIGH (ref 0.44–1.00)
GFR calc Af Amer: 55 mL/min — ABNORMAL LOW (ref >60)
GFR calc non Af Amer: 48 mL/min — ABNORMAL LOW (ref >60)
Glucose, Bld: 104 mg/dL — ABNORMAL HIGH (ref 70–99)
Potassium: 5.1 mmol/L (ref 3.5–5.1)
Sodium: 135 mmol/L (ref 135–145)

## 2019-05-05 LAB — URINALYSIS, ROUTINE W REFLEX MICROSCOPIC
Glucose, UA: NEGATIVE mg/dL
Hgb urine dipstick: NEGATIVE
Ketones, ur: 15 mg/dL — AB
Nitrite: NEGATIVE
Protein, ur: 30 mg/dL — AB
Specific Gravity, Urine: 1.02 (ref 1.005–1.030)
pH: 5 (ref 5.0–8.0)

## 2019-05-05 LAB — URINALYSIS, MICROSCOPIC (REFLEX): WBC, UA: >50 WBC/hpf (ref 0–5)

## 2019-05-05 NOTE — Progress Notes (Signed)
Patient ID: Beth Tyler, female   DOB: 04/18/1945, 74 y.o.   MRN: [id number]   Virtual Visit via video Note  This visit type was conducted due to national recommendations for restrictions regarding the COVID-19 pandemic (e.g. social distancing).  This format is felt to be most appropriate for this patient at this time.  All issues noted in this document were discussed and addressed.  No physical exam was performed (except for noted visual exam findings with Video Visits).   I connected with [name] by a video enabled telemedicine application and verified that I am speaking with the correct person using two identifiers. Location patient: home Location provider: work  Persons participating in the virtual visit: patient, provider  The limitations, risks, security and privacy concerns of performing an evaluation and management service by video and the availability of in person appointments have been discussed. The patient expressed understanding and agreed to proceed.   Reason for visit:   HPI: Recently diagnosed with covid.  Initially hospitalized 03/28/19 with afib and diagnosed with covid.  Presented to ER 04/04/19 with bradycardia, hypotension and hyponatremia.  Given atropine and treated with IVFs.  F/u with Dr Saralyn Pilar 04/05/20.  72 hour holter monitor placed.  Off hctz secondary to hyponatremia and decreased po intake.  Represented in ER 04/19/19 after feeling faint.  Sodium 131.  Urine negative.  Had f/u with cardiology 04/19/19 - added metoprolol 65m bid.  Admitted 04/27/19 - 05/01/19 with weakness and sodium 116.  Treated with IVFs.  Blood pressure low.  Antihypertensive medications held. Since her discharge, she reports she is feeling better.  Still feels "shaky" at times.  Still with decreased energy and fatigue.  Some nausea, but she is eating.  Making herself eat.  Bowels are better - on creon.  No increased diarrhea.  Sometimes loose stool alternating with constipation.  Has been  off hctz since 04/19/19 ER visit.   Blood pressure varying.  Was elevated.  Added back amlodipine recently.  Drinking gatorade and pedialite.  No chest pain.  Breathing overall stable.  No increased cough on chest congestion.     ROS: See pertinent positives and negatives per HPI.  Past Medical History:  Diagnosis Date  . Anemia   . Arthritis   . B12 deficiency   . Carpal tunnel syndrome, bilateral   . Diabetes mellitus ([place] 02/17/2012  . Diabetes mellitus, type II ([place]   . Dyspnea   . Gastritis   . GERD (gastroesophageal reflux disease)   . Hypercholesterolemia   . Hypertension   . IBS (irritable bowel syndrome)   . Neuropathy    legs  . Pernicious anemia   . Vertigo    no episodes fo 9 or 10 yrs    Past Surgical History:  Procedure Laterality Date  . ANTERIOR VITRECTOMY Right 12/01/2017   Procedure: ANTERIOR VITRECTOMY;  Surgeon: KEulogio Bear MD;  [place]  Service: Ophthalmology;  Laterality: Right;  . BREAST BIOPSY Right 1980   neg  . BREAST SURGERY  1980   benign cyst removal  . CATARACT EXTRACTION W/PHACO Left 10/13/2017   Procedure: CATARACT EXTRACTION PHACO AND INTRAOCULAR LENS PLACEMENT ([place]  LEFT  DIABETES;  Surgeon: KEulogio Bear MD;  [place]  Service: Ophthalmology;  Laterality: Left;  Diabetic - insulin and oral meds  . CATARACT EXTRACTION W/PHACO Right 12/01/2017   Procedure: CATARACT EXTRACTION PHACO AND INTRAOCULAR LENS PLACEMENT ([place]  RIGHT DIABETIC;  Surgeon: KEulogio Bear MD;  [place];  Service: Ophthalmology;  Laterality: Right;  Diabetic - insulin and oral  . CHOLECYSTECTOMY  1983    Family History  Problem Relation Age of Onset  . Diabetes Father   . Kidney disease Father   . Heart disease Father   . Alcohol abuse Mother   . Dementia Mother   . Diabetes Mother   . Diabetes Other   . Heart disease Son 81       CABG  . Hemophilia Brother   . Uterine cancer  Sister   . Diabetes Sister   . COPD Sister   . Breast cancer Maternal Aunt 51  . Breast cancer Daughter 29  . Stroke Daughter     SOCIAL HX: reviewed.    Current Outpatient Medications:  .  amLODipine (NORVASC) 10 MG tablet, Take 10 mg by mouth daily., Disp: , Rfl:  .  acetaminophen (TYLENOL) 500 MG tablet, Take 500-1,000 mg by mouth every 6 (six) hours as needed for mild pain or moderate pain. , Disp: , Rfl:  .  apixaban (ELIQUIS) 5 MG TABS tablet, Take 1 tablet (5 mg total) by mouth 2 (two) times daily., Disp: 60 tablet, Rfl: 1 .  fluticasone (CUTIVATE) 0.05 % cream, Apply 1 application topically as needed. Use as directed., Disp: , Rfl:  .  fluticasone furoate-vilanterol (BREO ELLIPTA) 200-25 MCG/INH AEPB, Inhale 1 puff into the lungs daily., Disp: 28 each, Rfl: 0 .  Insulin Glargine (BASAGLAR KWIKPEN) 100 UNIT/ML SOPN, Inject 0.28 mLs (28 Units total) into the skin every morning., Disp: , Rfl:  .  insulin lispro ([name]) 100 UNIT/ML injection, Inject 0.04-0.08 mLs (4-8 Units total) into the skin 3 (three) times daily with meals., Disp: , Rfl:  .  Insulin Pen Needle 32G X 4 MM MISC, Use to inject insulin three times daily, Disp: 300 each, Rfl: 3 .  lipase/protease/amylase ([name]) 36000 UNITS CPEP capsule, Take 62,694-85,462 Units by mouth See admin instructions. Take 2 capsules (72000 units) by mouth three times daily with meals and take 1 capsule (36000 units) by mouth daily with snacks, Disp: , Rfl:  .  magnesium oxide (MAG-OX) 400 (241.3 Mg) MG tablet, Take 1 tablet by mouth once daily (Patient taking differently: Take 400 mg by mouth daily. ), Disp: 90 tablet, Rfl: 0 .  metFORMIN (GLUCOPHAGE-XR) 500 MG 24 hr tablet, Take 1 tablet (500 mg total) by mouth 2 (two) times daily. (Patient taking differently: Take 1,000 mg by mouth 2 (two) times daily. ), Disp: , Rfl:  .  metoprolol tartrate (LOPRESSOR) 25 MG tablet, Take 25 mg by mouth 2 (two) times daily., Disp: , Rfl:  .  nystatin cream  (MYCOSTATIN), Apply 1 application topically 2 (two) times daily., Disp: 30 g, Rfl: 0 .  ONETOUCH VERIO test strip, USE 1 STRIP TO CHECK GLUCOSE TWICE DAILY (Patient taking differently: 1 each by Other route 2 (two) times daily. ), Disp: 200 each, Rfl: 0 .  pantoprazole (PROTONIX) 40 MG tablet, Take 1 tablet (40 mg total) by mouth 2 (two) times daily., Disp: , Rfl:  .  pioglitazone (ACTOS) 15 MG tablet, Take 1 tablet by mouth once daily (Patient taking differently: Take 15 mg by mouth daily. ), Disp: 90 tablet, Rfl: 0 .  simvastatin (ZOCOR) 10 MG tablet, TAKE 1 TABLET BY MOUTH AT BEDTIME (Patient taking differently: Take 10 mg by mouth at bedtime. ), Disp: 90 tablet, Rfl: 0 .  triamcinolone cream (KENALOG) 0.1 %, APPLY  CREAM TOPICALLY TWICE  DAILY (Patient taking differently: Apply 1 application topically 2 (two) times daily. ), Disp: 15 g, Rfl: 0  EXAM:  GENERAL: alert, oriented, appears not to feel well, but in no acute distress  HEENT: atraumatic, conjunttiva clear, no obvious abnormalities on inspection of external nose and ears  NECK: normal movements of the head and neck  LUNGS: on inspection no signs of respiratory distress, breathing rate appears normal, no obvious gross SOB, gasping or wheezing  CV: no obvious cyanosis  PSYCH/NEURO: pleasant and cooperative, no obvious depression or anxiety, speech and thought processing grossly intact  ASSESSMENT AND PLAN:  Discussed the following assessment and plan:  AF (paroxysmal atrial fibrillation) ([place]) On eliquis.  Off coreg.  On metoprolol.  Cardiology recently started back on metoprolol.  Still some elevated blood pressure and increased heart rate.  Adjusting medication as outlined.  Follow.  Continue f/u with cardiology.   Aortic atherosclerosis ([place]) On simvastatin.   B12 deficiency Continue B12 injections.   Controlled type 2 diabetes mellitus with hyperglycemia ([place]) Want to avoid low sugars.  Decreased appetite. She is  eating. Follow met b and a1c.   COVID-19 virus infection Recently admitted with covid as outlined. Diagnosed with afib.  Back on metoprolol.  On eliquis.  No increased cough or congestion.  Rest.  Encourage increased po food intake.  Follow.    GERD (gastroesophageal reflux disease) Continue protonix.    Hypertension Blood pressure elevated.  Just added back amlodipine.  Blood pressure starting to trend down.  Off hctz. Follow.    Hyponatremia Significant decreased sodium.  Was off hctz when admitted. Diarrhea improved on creon.  Remain off hctz.  Encourage increased po intake.  Recheck sodium.    ILD (interstitial lung disease) ([facility]) Breathing stable.    Orders Placed This Encounter  Procedures  . Basic metabolic panel    Wants drawn in [place]    Standing Status:   Future    Number of Occurrences:   1    Standing Expiration Date:   05/04/2020     I discussed the assessment and treatment plan with the patient. The patient was provided an opportunity to ask questions and all were answered. The patient agreed with the plan and demonstrated an understanding of the instructions.   The patient was advised to call back or seek an in-person evaluation if the symptoms worsen or if the condition fails to improve as anticipated.   Einar Pheasant, MD

## 2019-05-05 NOTE — Telephone Encounter (Signed)
Orders added for urine

## 2019-05-06 ENCOUNTER — Other Ambulatory Visit: Payer: Self-pay | Admitting: Pharmacy Technician

## 2019-05-06 ENCOUNTER — Inpatient Hospital Stay: Admission: RE | Admit: 2019-05-06 | Payer: Medicare HMO | Source: Ambulatory Visit

## 2019-05-06 LAB — URINE CULTURE: Culture: <10000 — AB

## 2019-05-06 NOTE — Patient Outreach (Signed)
[facility]) Care Management  05/06/2019  Beth Tyler 1945-06-15 [id number]  Received both patient and provider portion(s) of patient assistance application(s) for Basaglar and [name]. Faxed completed application and required documents into [name].  Will follow up with company(ies) in 7-10 business days to check status of application(s).  [name], CPhT Musician Care Management [phone]

## 2019-05-07 ENCOUNTER — Other Ambulatory Visit: Payer: Self-pay | Admitting: *Deleted

## 2019-05-07 ENCOUNTER — Telehealth: Payer: Self-pay | Admitting: *Deleted

## 2019-05-07 ENCOUNTER — Ambulatory Visit: Payer: Self-pay | Admitting: Internal Medicine

## 2019-05-07 DIAGNOSIS — R7989 Other specified abnormal findings of blood chemistry: Secondary | ICD-10-CM

## 2019-05-07 MED ORDER — NYSTATIN 100000 UNIT/GM EX CREA: 1.0000 "application " | TOPICAL_CREAM | Freq: Two times a day (BID) | CUTANEOUS | 0 refills | Status: DC

## 2019-05-07 NOTE — Telephone Encounter (Signed)
-----  Message from Einar Pheasant, MD sent at 05/07/2019  4:00 PM EST ----- Can you please call pt and let her know that her urine culture is negative for infection.  Confirm where her burning is. If appears to be more vaginal burning, I would recommend nystatin cream bid.  Confirm doing ok otherwise.  Has had covid and recently hospitalized with sodium of 116.  Let her know that her sodium level is normal - 145.  Potassium wnl, but at upper limits of normal.  Confirm not taking any potassium supplements or using an increased amount of salt substitutes.  Kidney function slightly decreased from last check,.  We will follow.  Recheck met b within the next 2 weeks.

## 2019-05-07 NOTE — Patient Outreach (Signed)
Red EMMI flag general discharge received for 05/06/19 Day #4- Sad/hopeless/ empty/ anxious- Yes.  Outreach call to pt to address, spoke with pt, HIPAA verified, pt reports she does have some days that she feels down and can vary day to day coping with health conditions such as "low sodium, urinary tract infection and A-fib"  Pt reports she saw primary MD this week on 05/05/19 and discussed all issues with her and reports " I'm not taking any more medication, it's not an option"  Pt reports she is talking with On Track Nurse with her insurance company twice monthly and " this is helping a lot"  Pt reports they not only discuss chronic health conditions but emotional issues as well, pt feels resources she has in place at present are helping.  Pt reports she had labs done and Na+ is 135 and will have labs weekly, pt states antibiotic being called in today for probable UTI.  Pt reports CBG "are excellent" usually in 80-90's range every morning, BP "is good" and pt checking daily as well as heart rate with highest reading of 102.  No further concerns voiced.  Irving Shows [facility], BSN [facility] Community Care Coordinator [phone]

## 2019-05-07 NOTE — Telephone Encounter (Signed)
Orders placed for labs

## 2019-05-07 NOTE — Telephone Encounter (Signed)
Patient aware of results and Nystatin sent and patient will go to [facility] for labs.

## 2019-05-09 ENCOUNTER — Encounter: Payer: Self-pay | Admitting: Internal Medicine

## 2019-05-09 NOTE — Assessment & Plan Note (Signed)
On simvastatin.   

## 2019-05-09 NOTE — Assessment & Plan Note (Addendum)
Significant decreased sodium.  Was off hctz when admitted. Diarrhea improved on creon.  Remain off hctz.  Encourage increased po intake.  Recheck sodium.

## 2019-05-09 NOTE — Assessment & Plan Note (Signed)
Want to avoid low sugars.  Decreased appetite. She is eating. Follow met b and a1c.

## 2019-05-09 NOTE — Assessment & Plan Note (Signed)
Continue protonix  

## 2019-05-09 NOTE — Assessment & Plan Note (Signed)
On eliquis.  Off coreg.  On metoprolol.  Cardiology recently started back on metoprolol.  Still some elevated blood pressure and increased heart rate.  Adjusting medication as outlined.  Follow.  Continue f/u with cardiology.

## 2019-05-09 NOTE — Assessment & Plan Note (Signed)
Continue B12 injections.   

## 2019-05-09 NOTE — Assessment & Plan Note (Signed)
Breathing stable.

## 2019-05-09 NOTE — Assessment & Plan Note (Signed)
Recently admitted with covid as outlined. Diagnosed with afib.  Back on metoprolol.  On eliquis.  No increased cough or congestion.  Rest.  Encourage increased po food intake.  Follow.

## 2019-05-09 NOTE — Assessment & Plan Note (Signed)
Blood pressure elevated.  Just added back amlodipine.  Blood pressure starting to trend down.  Off hctz. Follow.

## 2019-05-12 DIAGNOSIS — R634 Abnormal weight loss: Secondary | ICD-10-CM | POA: Diagnosis not present

## 2019-05-12 DIAGNOSIS — K580 Irritable bowel syndrome with diarrhea: Secondary | ICD-10-CM | POA: Diagnosis not present

## 2019-05-12 DIAGNOSIS — R1012 Left upper quadrant pain: Secondary | ICD-10-CM | POA: Diagnosis not present

## 2019-05-12 DIAGNOSIS — R110 Nausea: Secondary | ICD-10-CM | POA: Diagnosis not present

## 2019-05-13 ENCOUNTER — Encounter: Payer: Self-pay | Admitting: *Deleted

## 2019-05-13 ENCOUNTER — Other Ambulatory Visit: Payer: Self-pay | Admitting: *Deleted

## 2019-05-13 ENCOUNTER — Ambulatory Visit: Payer: Medicare HMO | Admitting: Internal Medicine

## 2019-05-13 NOTE — Patient Outreach (Signed)
Outreach call to pt for telephone assessment, spoke with pt, HIPAA verified, pt reports she continues checking CBG, BP, heart rate daily and recording all.  CBG ranges 80-90 in am.  Heart rate has been no higher than 100 with most readings in 80's range, pt states " sometimes it feels like I have palpitations"  BP ranges systolic is 130's and diastolic is 70's with occasional elevated reading such as 170/90. Pt plans to discuss any issues with cardiologist at appointment this month and take log.  Pt reports she has lost weight and is down to 197 pounds, pt reports she is eating well.  Pt saw GI doctor this week for continued pain in LUQ and has endoscopy scheduled for 05/28/19, pt has chronic pancreas issues and continues taking enzymes.  Pt also to see nephrologist this month.  Pt feels her strength is coming back since having Covid but feels a lot of her health conditions have been aggravated by Covid.    [facility] CM Care Plan Problem One     Most Recent Value  Care Plan Problem One  Knowledge deficit related to Atrial Fibrillation  Role Documenting the Problem One  Care Management Coordinator  Care Plan for Problem One  Active  [facility] Long Term Goal   Pt will successfully manage atrial fibrillation (symptom management, action plan) within 60 days  [facility] Long Term Goal Start Date  04/16/19  Interventions for Problem One Long Term Goal  RN CM reviewed plan of care with pt, continues to monitor HR, BP, CBG and recording.  [facility] CM Short Term Goal #1   Pt will verbalize/ adhere to action plan for Atrial Fibrillation within 30 days  [facility] CM Short Term Goal #1 Start Date  05/13/19 Staci Righter re-established for reinforcement]  Interventions for Short Term Goal #1  RN CM reinforced action plan for atrial fibrillation, importance of communicating with MD for change in health status    [facility] CM Care Plan Problem Two     Most Recent Value  Care Plan Problem Two  Knowledge deficit related to diabetes  Role Documenting the  Problem Two  Care Management Coordinator  Care Plan for Problem Two  Active  [facility] CM Short Term Goal #1   Pt will verbalize understanding of/ utilize plate method within 30 days.  [facility] CM Short Term Goal #1 Start Date  05/13/19 Staci Righter re-established]  Interventions for Short Term Goal #2   RN CM reinforced healthy food choices, plate method, portion control      PLAN Outreach pt for telephone assessment next month  Irving Shows [facility], BSN [facility] Care Coordinator [phone]

## 2019-05-14 DIAGNOSIS — K5900 Constipation, unspecified: Secondary | ICD-10-CM | POA: Diagnosis not present

## 2019-05-14 DIAGNOSIS — R634 Abnormal weight loss: Secondary | ICD-10-CM | POA: Diagnosis not present

## 2019-05-17 ENCOUNTER — Other Ambulatory Visit: Payer: Self-pay | Admitting: Pharmacy Technician

## 2019-05-17 ENCOUNTER — Ambulatory Visit: Payer: Self-pay | Admitting: Pharmacist

## 2019-05-17 DIAGNOSIS — K589 Irritable bowel syndrome without diarrhea: Secondary | ICD-10-CM

## 2019-05-17 DIAGNOSIS — E1140 Type 2 diabetes mellitus with diabetic neuropathy, unspecified: Secondary | ICD-10-CM

## 2019-05-17 NOTE — Patient Outreach (Signed)
[facility]) Care Management  05/17/2019  Beth Tyler [birth year] [id number]   Care coordination call placed to [name] in regards to patient's application for Basaglar and [name].  Spoke to [place] who informed the application is in the processing phase. He suggests to try back in a few days.  Will follow up with [name] in  5-7 business days.  Markiesha Delia P. Chiniqua Kilcrease, CPhT Musician Care Management [phone]

## 2019-05-17 NOTE — Chronic Care Management (AMB) (Signed)
Chronic Care Management   Follow Up Note   05/17/2019 Name: Beth Tyler MRN: [id number] DOB: [birth year]  Referred by: Dale Campbell, MD Reason for referral : Chronic Care Management (Medication Management)   Beth Tyler is a 74 y.o. year old female who is a primary care patient of Dale Clover, MD. The CCM team was consulted for assistance with chronic disease management and care coordination needs.    Received call from patient today with medication access needs.   Review of patient status, including review of consultants reports, relevant laboratory and other test results, and collaboration with appropriate care team members and the patient's provider was performed as part of comprehensive patient evaluation and provision of chronic care management services.    SDOH (Social Determinants of Health) screening performed today: Financial Strain . See Care Plan for related entries.   Outpatient Encounter Medications as of 05/17/2019  Medication Sig Note  . acetaminophen (TYLENOL) 500 MG tablet Take 500-1,000 mg by mouth every 6 (six) hours as needed for mild pain or moderate pain.    Marland Kitchen amLODipine (NORVASC) 10 MG tablet Take 10 mg by mouth daily.   Marland Kitchen apixaban (ELIQUIS) 5 MG TABS tablet Take 1 tablet (5 mg total) by mouth 2 (two) times daily.   . fluticasone (CUTIVATE) 0.05 % cream Apply 1 application topically as needed. Use as directed.   . fluticasone furoate-vilanterol (BREO ELLIPTA) 200-25 MCG/INH AEPB Inhale 1 puff into the lungs daily.   . Insulin Glargine (BASAGLAR KWIKPEN) 100 UNIT/ML SOPN Inject 0.28 mLs (28 Units total) into the skin every morning. 05/03/2019: 24 units   . insulin lispro ([name]) 100 UNIT/ML injection Inject 0.04-0.08 mLs (4-8 Units total) into the skin 3 (three) times daily with meals. 05/03/2019: Using 2-3 units right now  . Insulin Pen Needle 32G X 4 MM MISC Use to inject insulin three times daily   . lipase/protease/amylase ([name]) 36000 UNITS CPEP  capsule Take 36,000-72,000 Units by mouth See admin instructions. Take 2 capsules (72000 units) by mouth three times daily with meals and take 1 capsule (36000 units) by mouth daily with snacks   . magnesium oxide (MAG-OX) 400 (241.3 Mg) MG tablet Take 1 tablet by mouth once daily (Patient taking differently: Take 400 mg by mouth daily. )   . metFORMIN (GLUCOPHAGE-XR) 500 MG 24 hr tablet Take 1 tablet (500 mg total) by mouth 2 (two) times daily. (Patient taking differently: Take 1,000 mg by mouth 2 (two) times daily. )   . metoprolol tartrate (LOPRESSOR) 25 MG tablet Take 25 mg by mouth 2 (two) times daily.   Marland Kitchen nystatin cream (MYCOSTATIN) Apply 1 application topically 2 (two) times daily.   Letta Pate VERIO test strip USE 1 STRIP TO CHECK GLUCOSE TWICE DAILY (Patient taking differently: 1 each by Other route 2 (two) times daily. )   . pantoprazole (PROTONIX) 40 MG tablet Take 1 tablet (40 mg total) by mouth 2 (two) times daily.   . pioglitazone (ACTOS) 15 MG tablet Take 1 tablet by mouth once daily (Patient taking differently: Take 15 mg by mouth daily. )   . simvastatin (ZOCOR) 10 MG tablet TAKE 1 TABLET BY MOUTH AT BEDTIME (Patient taking differently: Take 10 mg by mouth at bedtime. )   . triamcinolone cream (KENALOG) 0.1 % APPLY  CREAM TOPICALLY TWICE DAILY (Patient taking differently: Apply 1 application topically 2 (two) times daily. )    No facility-administered encounter medications on file as of 05/17/2019.  Objective:   Goals Addressed            This Visit's Progress     Patient Stated   . "I can't afford my medications" (pt-stated)       Current Barriers:  . Diabetes: uncontrolled though improved per BG; most recent A1c 7.3%, o Calls today noting that she is having a difficult time refilling [name] from [facility] Patient assistance.  . Current antihyperglycemic regimen: Pioglitazone 15 mg daily, Basaglar 24 units daily, [name] 2-3 units PRN o GLP1 not a option d/t significant  IBS. Renal function low for SGLT2 to provide glycemic benefit . Cardiovascular risk reduction: o Current hypertensive regimen: amlodipine 10 mg QPM, carvedilol 25 mg BID, HCTZ 25 mg QAM, lisinopril 20 mg BID;  o Current hyperlipidemia regimen: simvastatin 10 mg daily; LDL at goal <70 on last check . Atrial Fibrillation: Eliquis 5 mg BID; received free 30 day supply copay card, and is concerned about the expense moving forward if she is to remain on this therapy chronically. Notes that she has follow up with cardiology on 12/29  Pharmacist Clinical Goal(s):  Marland Kitchen Over the next 90 days, patient with work with PharmD and primary care provider to address optimized glycemic management  Interventions: . Contacted [name] patient assistance. Patient has a refill processed and coming in 3-5 business days. . Contacted patient. She noted that she was able to get through to [facility] after she called me. Thanked me for returning her call  Patient Self Care Activities:  . Patient will check blood glucose QID, document, and provide at future appointments . Patient will take medications as prescribed . Patient will report any questions or concerns to provider   Please see past updates related to this goal by clicking on the "Past Updates" button in the selected goal          Plan:  - Will outreach patient as previously scheduled  Catie Darnelle Maffucci, PharmD, [place], [place] Pharmacist [place] [phone]

## 2019-05-17 NOTE — Patient Instructions (Signed)
Visit Information  Goals Addressed            This Visit's Progress     Patient Stated   . "I can't afford my medications" (pt-stated)       Current Barriers:  . Diabetes: uncontrolled though improved per BG; most recent A1c 7.3%, o Calls today noting that she is having a difficult time refilling [name] from [place] Patient assistance.  . Current antihyperglycemic regimen: Pioglitazone 15 mg daily, Basaglar 24 units daily, [name] 2-3 units PRN o GLP1 not a option d/t significant IBS. Renal function low for SGLT2 to provide glycemic benefit . Cardiovascular risk reduction: o Current hypertensive regimen: amlodipine 10 mg QPM, carvedilol 25 mg BID, HCTZ 25 mg QAM, lisinopril 20 mg BID;  o Current hyperlipidemia regimen: simvastatin 10 mg daily; LDL at goal <70 on last check . Atrial Fibrillation: Eliquis 5 mg BID; received free 30 day supply copay card, and is concerned about the expense moving forward if she is to remain on this therapy chronically. Notes that she has follow up with cardiology on 12/29  Pharmacist Clinical Goal(s):  Marland Kitchen Over the next 90 days, patient with work with PharmD and primary care provider to address optimized glycemic management  Interventions: . Contacted [name] patient assistance. Patient has a refill processed and coming in 3-5 business days. . Contacted patient. She noted that she was able to get through to [facility] after she called me. Thanked me for returning her call  Patient Self Care Activities:  . Patient will check blood glucose QID, document, and provide at future appointments . Patient will take medications as prescribed . Patient will report any questions or concerns to provider   Please see past updates related to this goal by clicking on the "Past Updates" button in the selected goal         The patient verbalized understanding of instructions provided today and declined a print copy of patient instruction materials.   Plan:  - Will  outreach patient as previously scheduled  Catie Feliz Beam, PharmD, [place], CPP Clinical Pharmacist [facility] [employer] [phone]

## 2019-05-18 NOTE — Progress Notes (Signed)
Reviewed information.  Agree with assessment and plan.    Dr Jarick Harkins 

## 2019-05-20 ENCOUNTER — Telehealth: Payer: Self-pay | Admitting: Internal Medicine

## 2019-05-20 DIAGNOSIS — Z8616 Personal history of COVID-19: Secondary | ICD-10-CM

## 2019-05-20 DIAGNOSIS — R9389 Abnormal findings on diagnostic imaging of other specified body structures: Secondary | ICD-10-CM

## 2019-05-20 NOTE — Telephone Encounter (Signed)
The order for the lab was already in.  I have placed order for cxr.  She should be able to go at her convenience.

## 2019-05-20 NOTE — Telephone Encounter (Signed)
Pt said she needs chest xray and sodium test done. She said she go the last one done in [place]. She would like to get it done before next Friday if possible. I was going to schedule sodium check here but she said she wanted to go to [place]. Please advise.

## 2019-05-20 NOTE — Telephone Encounter (Signed)
Patient is due for f/u met b- wants to do in [place]. Does she need another cxr while she is there?

## 2019-05-21 DIAGNOSIS — I890 Lymphedema, not elsewhere classified: Secondary | ICD-10-CM | POA: Diagnosis not present

## 2019-05-21 DIAGNOSIS — E1169 Type 2 diabetes mellitus with other specified complication: Secondary | ICD-10-CM | POA: Diagnosis not present

## 2019-05-21 DIAGNOSIS — R0602 Shortness of breath: Secondary | ICD-10-CM | POA: Diagnosis not present

## 2019-05-21 DIAGNOSIS — I10 Essential (primary) hypertension: Secondary | ICD-10-CM | POA: Diagnosis not present

## 2019-05-21 DIAGNOSIS — I480 Paroxysmal atrial fibrillation: Secondary | ICD-10-CM | POA: Diagnosis not present

## 2019-05-21 NOTE — Telephone Encounter (Signed)
Patient is aware 

## 2019-05-24 ENCOUNTER — Other Ambulatory Visit: Payer: Self-pay

## 2019-05-24 ENCOUNTER — Other Ambulatory Visit
Admission: RE | Admit: 2019-05-24 | Discharge: 2019-05-24 | Disposition: A | Discharge status: Home / Self Care | Payer: Medicare HMO | Source: Home / Self Care | Attending: Internal Medicine | Admitting: Internal Medicine

## 2019-05-24 ENCOUNTER — Ambulatory Visit
Admission: RE | Admit: 2019-05-24 | Discharge: 2019-05-24 | Disposition: A | Discharge status: Home / Self Care | Payer: Medicare HMO | Attending: Internal Medicine | Admitting: Internal Medicine

## 2019-05-24 ENCOUNTER — Ambulatory Visit
Admission: RE | Admit: 2019-05-24 | Discharge: 2019-05-24 | Disposition: A | Discharge status: Home / Self Care | Payer: Medicare HMO | Source: Ambulatory Visit | Attending: Internal Medicine | Admitting: Internal Medicine

## 2019-05-24 DIAGNOSIS — Z8616 Personal history of COVID-19: Secondary | ICD-10-CM | POA: Insufficient documentation

## 2019-05-24 DIAGNOSIS — R9389 Abnormal findings on diagnostic imaging of other specified body structures: Secondary | ICD-10-CM | POA: Diagnosis not present

## 2019-05-24 DIAGNOSIS — R7989 Other specified abnormal findings of blood chemistry: Secondary | ICD-10-CM | POA: Insufficient documentation

## 2019-05-24 DIAGNOSIS — R918 Other nonspecific abnormal finding of lung field: Secondary | ICD-10-CM | POA: Diagnosis not present

## 2019-05-24 LAB — BASIC METABOLIC PANEL
Anion gap: 10 (ref 5–15)
BUN: 16 mg/dL (ref 8–23)
CO2: 25 mmol/L (ref 22–32)
Calcium: 9.3 mg/dL (ref 8.9–10.3)
Chloride: 99 mmol/L (ref 98–111)
Creatinine, Ser: 1 mg/dL (ref 0.44–1.00)
GFR calc Af Amer: >60 mL/min (ref >60)
GFR calc non Af Amer: 55 mL/min — ABNORMAL LOW (ref >60)
Glucose, Bld: 86 mg/dL (ref 70–99)
Potassium: 5.2 mmol/L — ABNORMAL HIGH (ref 3.5–5.1)
Sodium: 134 mmol/L — ABNORMAL LOW (ref 135–145)

## 2019-05-25 ENCOUNTER — Other Ambulatory Visit: Payer: Self-pay | Admitting: Internal Medicine

## 2019-05-25 ENCOUNTER — Encounter: Payer: Self-pay | Admitting: Internal Medicine

## 2019-05-25 ENCOUNTER — Other Ambulatory Visit: Payer: Self-pay | Admitting: Pharmacy Technician

## 2019-05-25 DIAGNOSIS — E876 Hypokalemia: Secondary | ICD-10-CM

## 2019-05-25 DIAGNOSIS — E875 Hyperkalemia: Secondary | ICD-10-CM

## 2019-05-25 DIAGNOSIS — E871 Hypo-osmolality and hyponatremia: Secondary | ICD-10-CM

## 2019-05-25 NOTE — Progress Notes (Signed)
Order placed for f/u lab.   

## 2019-05-25 NOTE — Patient Outreach (Signed)
[facility]) Care Management  05/25/2019  Beth Tyler 08-14-1945 [id number]    ADDENDUM   Incoming call from patient regarding patient assistance application(s) for Basaglar and [name] with [name] , HIPAA identifiers verified.   Informed patient that Julious Oka is requiring updated financial information. Patient informed tthat she had the required documents and that she would mail them to me. Provided patient address to mail the information. Patient informed she would place in mail this evening for 5pm pickup.  Follow up:  Will await return of documents to submit to [name].  Moneisha Vosler P. Chasitty Hehl, CPhT Musician Care Management [phone]

## 2019-05-25 NOTE — Patient Outreach (Signed)
[facility]) Care Management  05/25/2019  Beth Tyler 1946/02/27 [id number]   Care coordination call placed to [name] in regards to patient's application for Basaglar and [name].  Spoke to [facility] who informed the processing team has requested an updated proof of income. He informed they will accept 2021 social security awards letters and 2020 tax form 1099.  Will outreach patient with this information.  Edna Rede P. Georgie Eduardo, CPhT Musician Care Management [phone]

## 2019-05-25 NOTE — Patient Outreach (Signed)
[facility]) Care Management  05/25/2019  ARIENNE GARTIN May 29, 1945 [id number]    ADDENDUM   Unsuccessful call placed to patient regarding patient assistance application(s) for Basaglar and [name] with [name] , HIPAA compliant voicemail left.   Was calling to inform patient that an updated form of financials is being requested by [employer].  Follow up:  Will route note to [facility] RPh Catie Feliz Beam for assistance and will follow up with patient in 5-7 business days if call is not returned.  Brianni Manthe P. Jaima Janney, CPhT Musician Care Management [phone]

## 2019-05-26 ENCOUNTER — Other Ambulatory Visit: Payer: Self-pay | Admitting: Gastroenterology

## 2019-05-26 ENCOUNTER — Other Ambulatory Visit: Admission: RE | Admit: 2019-05-26 | Payer: Medicare HMO | Source: Ambulatory Visit

## 2019-05-26 DIAGNOSIS — K580 Irritable bowel syndrome with diarrhea: Secondary | ICD-10-CM

## 2019-05-27 ENCOUNTER — Encounter: Payer: Self-pay | Admitting: Internal Medicine

## 2019-05-28 ENCOUNTER — Encounter: Admission: RE | Payer: Self-pay | Source: Home / Self Care

## 2019-05-28 ENCOUNTER — Ambulatory Visit: Admission: RE | Admit: 2019-05-28 | Payer: Medicare HMO | Source: Home / Self Care | Admitting: General Surgery

## 2019-05-28 SURGERY — ESOPHAGOGASTRODUODENOSCOPY (EGD) WITH PROPOFOL
Anesthesia: General

## 2019-05-29 ENCOUNTER — Ambulatory Visit: Payer: Medicare HMO

## 2019-05-31 ENCOUNTER — Ambulatory Visit (INDEPENDENT_AMBULATORY_CARE_PROVIDER_SITE_OTHER): Payer: Medicare HMO | Admitting: Pharmacist

## 2019-05-31 ENCOUNTER — Telehealth: Payer: Self-pay

## 2019-05-31 DIAGNOSIS — E1140 Type 2 diabetes mellitus with diabetic neuropathy, unspecified: Secondary | ICD-10-CM | POA: Diagnosis not present

## 2019-05-31 DIAGNOSIS — E875 Hyperkalemia: Secondary | ICD-10-CM

## 2019-05-31 DIAGNOSIS — I4891 Unspecified atrial fibrillation: Secondary | ICD-10-CM | POA: Diagnosis not present

## 2019-05-31 DIAGNOSIS — Z794 Long term (current) use of insulin: Secondary | ICD-10-CM | POA: Diagnosis not present

## 2019-05-31 MED ORDER — METFORMIN HCL ER 500 MG PO TB24: 1000.0000 mg | ORAL_TABLET | Freq: Two times a day (BID) | ORAL | 3 refills | Status: DC

## 2019-05-31 NOTE — Patient Instructions (Signed)
Visit Information  Goals Addressed            This Visit's Progress     Patient Stated   . "I can't afford my medications" (pt-stated)       Current Barriers:  . Diabetes: controlled (appropriate goal <7.5%); most recent A1c 7.3% o Notes she continues to recover from COVID. Still tired, but struggling more with continued pancreatic "pain" and occasional elevated HR . Current antihyperglycemic regimen: Basaglar 26 units daily, [name] 3-8 units PRN, pioglitazone 15 mg, metformin 1000 mg BID o GLP1 not a option d/t significant IBS. Renal function low for SGLT2 to provide glycemic benefit . Current glucose readings:  o Fastings: 80-110 o Post prandials: nothing >200 . Cardiovascular risk reduction: o Current hypertensive regimen: amlodipine 10 mg QPM, metoprolol tartrate 25 mg BID; reports all BP readings 120-130s/70s o Current hyperlipidemia regimen: simvastatin 10 mg daily; LDL at goal <70 on last check . Atrial Fibrillation: Eliquis 5 mg BID; reports last copay was $141 . Pancreatic insufficiency: [name] w/ meals snacks; receiving via [name] patient assistance program . S/p COVID: was given Breo at the hospital for COVID. Notes that she has not needed to use it since coming home  Pharmacist Clinical Goal(s):  Marland Kitchen Over the next 90 days, patient with work with PharmD and primary care provider to address optimized glycemic management  Interventions: . Comprehensive medication review performed, medication list updated in electronic medical record . Discussed possible risk of fluid retention w/ pioglitazone and link between afib and heart failure. Will hold pioglitazone and monitor sugars, likely d/c in the future.  . Continue metformin 1000 mg BID, Basaglar 26 units QAM, [name] 3-8 units with meals. Updated metformin prescription for current use.  . Reviewed eligibility for Eliquis assistance through [employer]. Patient must spend 3% of total household income on copays for prescriptions, though  [employer] generally gives a "credit". Encouraged patient to keep an eye on monthly explanation of benefits from [place] that will show her current total of drug copay spend, and will let me know when she approaches ~$600, and we will start working on Eliquis assistance.   Patient Self Care Activities:  . Patient will check blood glucose QID, document, and provide at future appointments . Patient will take medications as prescribed . Patient will report any questions or concerns to provider   Please see past updates related to this goal by clicking on the "Past Updates" button in the selected goal         Patient verbalizes understanding of instructions provided today.   Plan:  - Scheduled f/u call 07/05/19  Catie Feliz Beam, PharmD, BCACP, CPP Clinical Pharmacist [facility] [employer] [phone]

## 2019-05-31 NOTE — Telephone Encounter (Signed)
Labs reordered for [place].

## 2019-05-31 NOTE — Chronic Care Management (AMB) (Signed)
Chronic Care Management   Follow Up Note   05/31/2019 Name: Beth Tyler MRN: [id number] DOB: 1946/02/12  Referred by: Dale Garrett, MD Reason for referral : Chronic Care Management (Medication Management)   Beth Tyler is a 74 y.o. year old female who is a primary care patient of Dale [place], MD. The CCM team was consulted for assistance with chronic disease management and care coordination needs.    Contacted patient for medication management review.   Review of patient status, including review of consultants reports, relevant laboratory and other test results, and collaboration with appropriate care team members and the patient's provider was performed as part of comprehensive patient evaluation and provision of chronic care management services.    SDOH (Social Determinants of Health) assessments performed: Yes SDOH Interventions     Most Recent Value  SDOH Interventions  SDOH Interventions for the Following Domains  Financial Strain  Financial Strain Interventions  Other (Comment) [Patient assistance application]       Outpatient Encounter Medications as of 05/31/2019  Medication Sig Note  . acetaminophen (TYLENOL) 500 MG tablet Take 500-1,000 mg by mouth every 6 (six) hours as needed for mild pain or moderate pain.    Marland Kitchen amLODipine (NORVASC) 10 MG tablet Take 10 mg by mouth daily. 05/31/2019: QPM  . apixaban (ELIQUIS) 5 MG TABS tablet Take 1 tablet (5 mg total) by mouth 2 (two) times daily.   . Insulin Glargine (BASAGLAR KWIKPEN) 100 UNIT/ML SOPN Inject 0.28 mLs (28 Units total) into the skin every morning. 05/31/2019: 26 units  . insulin lispro ([name]) 100 UNIT/ML injection Inject 0.04-0.08 mLs (4-8 Units total) into the skin 3 (three) times daily with meals. 05/31/2019: 3-8 units  . Insulin Pen Needle 32G X 4 MM MISC Use to inject insulin three times daily   . lipase/protease/amylase ([name]) 36000 UNITS CPEP capsule Take 36,000-72,000 Units by mouth See admin  instructions. Take 2 capsules (72000 units) by mouth three times daily with meals and take 1 capsule (36000 units) by mouth daily with snacks   . magnesium oxide (MAG-OX) 400 (241.3 Mg) MG tablet Take 1 tablet by mouth once daily   . metFORMIN (GLUCOPHAGE-XR) 500 MG 24 hr tablet Take 2 tablets (1,000 mg total) by mouth 2 (two) times daily.   . metoprolol tartrate (LOPRESSOR) 25 MG tablet Take 25 mg by mouth 2 (two) times daily.   Letta Pate VERIO test strip USE 1 STRIP TO CHECK GLUCOSE TWICE DAILY (Patient taking differently: 1 each by Other route 2 (two) times daily. )   . pantoprazole (PROTONIX) 40 MG tablet Take 1 tablet (40 mg total) by mouth 2 (two) times daily.   . pioglitazone (ACTOS) 15 MG tablet Take 1 tablet by mouth once daily   . simvastatin (ZOCOR) 10 MG tablet TAKE 1 TABLET BY MOUTH AT BEDTIME   . triamcinolone cream (KENALOG) 0.1 % APPLY  CREAM TOPICALLY TWICE DAILY   . [DISCONTINUED] metFORMIN (GLUCOPHAGE-XR) 500 MG 24 hr tablet Take 1 tablet (500 mg total) by mouth 2 (two) times daily. (Patient taking differently: Take 1,000 mg by mouth 2 (two) times daily. )   . fluticasone (CUTIVATE) 0.05 % cream Apply 1 application topically as needed. Use as directed.   . fluticasone furoate-vilanterol (BREO ELLIPTA) 200-25 MCG/INH AEPB Inhale 1 puff into the lungs daily. (Patient not taking: Reported on 05/31/2019)   . nystatin cream (MYCOSTATIN) Apply 1 application topically 2 (two) times daily. (Patient not taking: Reported on 05/31/2019)  No facility-administered encounter medications on file as of 05/31/2019.     Objective:   Goals Addressed            This Visit's Progress     Patient Stated   . "I can't afford my medications" (pt-stated)       Current Barriers:  . Diabetes: controlled (appropriate goal <7.5%); most recent A1c 7.3% o Notes she continues to recover from [place]. Still tired, but struggling more with continued pancreatic "pain" and occasional elevated HR . Current  antihyperglycemic regimen: Basaglar 26 units daily, [name] 3-8 units PRN, pioglitazone 15 mg, metformin 1000 mg BID o GLP1 not a option d/t significant IBS. Renal function low for SGLT2 to provide glycemic benefit . Current glucose readings:  o Fastings: 80-110 o Post prandials: nothing >200 . Cardiovascular risk reduction: o Current hypertensive regimen: amlodipine 10 mg QPM, metoprolol tartrate 25 mg BID; reports all BP readings 120-130s/70s o Current hyperlipidemia regimen: simvastatin 10 mg daily; LDL at goal <70 on last check . Atrial Fibrillation: Eliquis 5 mg BID; reports last copay was $141 . Pancreatic insufficiency: [name] w/ meals snacks; receiving via [name] patient assistance program . S/p COVID: was given Breo at the hospital for [place]. Notes that she has not needed to use it since coming home  Pharmacist Clinical Goal(s):  Marland Kitchen Over the next 90 days, patient with work with PharmD and primary care provider to address optimized glycemic management  Interventions: . Comprehensive medication review performed, medication list updated in electronic medical record . Discussed possible risk of fluid retention w/ pioglitazone and link between afib and heart failure. Will hold pioglitazone and monitor sugars, likely d/c in the future.  . Continue metformin 1000 mg BID, Basaglar 26 units QAM, [name] 3-8 units with meals. Updated metformin prescription for current use.  . Reviewed eligibility for Eliquis assistance through [employer]MS. Patient must spend 3% of total household income on copays for prescriptions, though [employer]MS generally gives a "credit". Encouraged patient to keep an eye on monthly explanation of benefits from [place] that will show her current total of drug copay spend, and will let me know when she approaches ~$600, and we will start working on Eliquis assistance.   Patient Self Care Activities:  . Patient will check blood glucose QID, document, and provide at future  appointments . Patient will take medications as prescribed . Patient will report any questions or concerns to provider   Please see past updates related to this goal by clicking on the "Past Updates" button in the selected goal          Plan:  - Scheduled f/u call 07/05/19  Catie Darnelle Maffucci, PharmD, BCACP, [place] Pharmacist [place] [phone]

## 2019-06-01 NOTE — Progress Notes (Signed)
Reviewed information.  Will need f/u regarding pain and elevated heart rate.  Sees cardiology as well.    Dr Lorin Picket

## 2019-06-03 ENCOUNTER — Other Ambulatory Visit: Payer: Self-pay | Admitting: Pharmacy Technician

## 2019-06-03 ENCOUNTER — Other Ambulatory Visit
Admission: RE | Admit: 2019-06-03 | Discharge: 2019-06-03 | Disposition: A | Discharge status: Home / Self Care | Payer: Medicare HMO | Attending: Internal Medicine | Admitting: Internal Medicine

## 2019-06-03 ENCOUNTER — Other Ambulatory Visit: Payer: Self-pay

## 2019-06-03 DIAGNOSIS — E875 Hyperkalemia: Secondary | ICD-10-CM

## 2019-06-03 LAB — BASIC METABOLIC PANEL
Anion gap: 12 (ref 5–15)
BUN: 17 mg/dL (ref 8–23)
CO2: 23 mmol/L (ref 22–32)
Calcium: 9 mg/dL (ref 8.9–10.3)
Chloride: 104 mmol/L (ref 98–111)
Creatinine, Ser: 0.89 mg/dL (ref 0.44–1.00)
GFR calc Af Amer: >60 mL/min (ref >60)
GFR calc non Af Amer: >60 mL/min (ref >60)
Glucose, Bld: 117 mg/dL — ABNORMAL HIGH (ref 70–99)
Potassium: 4.5 mmol/L (ref 3.5–5.1)
Sodium: 139 mmol/L (ref 135–145)

## 2019-06-03 NOTE — Patient Outreach (Signed)
[facility]) Care Management  06/03/2019  Beth Tyler [birth year] [id number]  Received patient's updated income information for [employer] application for [name] and [name].   Submitted information to [place] cares.  Will follow up with [name] in 7-10 business days.  Lebaron Bautch P. Sarahbeth Cashin, CPhT Musician Care Management [phone]

## 2019-06-04 ENCOUNTER — Ambulatory Visit
Admission: RE | Admit: 2019-06-04 | Discharge: 2019-06-04 | Disposition: A | Discharge status: Home / Self Care | Payer: Medicare HMO | Source: Ambulatory Visit | Attending: Gastroenterology | Admitting: Gastroenterology

## 2019-06-04 DIAGNOSIS — K580 Irritable bowel syndrome with diarrhea: Secondary | ICD-10-CM | POA: Diagnosis not present

## 2019-06-07 ENCOUNTER — Other Ambulatory Visit: Payer: Self-pay | Admitting: *Deleted

## 2019-06-07 ENCOUNTER — Other Ambulatory Visit: Payer: Self-pay | Admitting: Pharmacy Technician

## 2019-06-07 NOTE — Patient Outreach (Signed)
[facility]) Care Management  06/07/2019  Beth Tyler [birth year] [id number]    Incoming call from patient regarding patient assistance application(s) for Basaglar and [name] with [name] , HIPAA identifiers verified.   Patient was calling to inquire if her proof of income had been received. Informed patient it was received on 06/03/2019 an submitted to the company.Informed patient that follow up with the company would be forthcoming due to their processing times. Patient verbalized understanding.  Follow up:  Will follow up with [name] as previously scheduled.  Beth Tyler, CPhT Musician Care Management [phone]

## 2019-06-07 NOTE — Patient Outreach (Signed)
Telephone outreach.  Advised pt that I will be her new care manager since Raynelle Fanning has been appointed to a new position.  Reviewed her health hx and most recent problems of AFIB and COVID.  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 She denies having had a CT of her head. She also reports some visual fluctuations in the evening. She also has diabetes which is well controlled.  She is taking her metoprolol 25 mg bid and epixiban 5 mg bid. Med reconcillation was completed.  Patient was recently discharged from hospital and all medications have been reviewed. Outpatient Encounter Medications as of 06/07/2019  Medication Sig Note  . acetaminophen (TYLENOL) 500 MG tablet Take 500-1,000 mg by mouth every 6 (six) hours as needed for mild pain or moderate pain.    Marland Kitchen amLODipine (NORVASC) 10 MG tablet Take 10 mg by mouth daily. 05/31/2019: QPM  . apixaban (ELIQUIS) 5 MG TABS tablet Take 1 tablet (5 mg total) by mouth 2 (two) times daily.   . Insulin Glargine (BASAGLAR KWIKPEN) 100 UNIT/ML SOPN Inject 0.28 mLs (28 Units total) into the skin every morning. 05/31/2019: 26 units  . insulin lispro ([name]) 100 UNIT/ML injection Inject 0.04-0.08 mLs (4-8 Units total) into the skin 3 (three) times daily with meals. 05/31/2019: 3-8 units  . Insulin Pen Needle 32G X 4 MM MISC Use to inject insulin three times daily   . lipase/protease/amylase ([name]) 36000 UNITS CPEP capsule Take 36,000-72,000 Units by mouth See admin instructions. Take 2 capsules (72000 units) by mouth three times daily with meals and take 1 capsule (36000 units) by mouth daily with snacks   . magnesium oxide (MAG-OX) 400 (241.3 Mg) MG tablet Take 1 tablet by mouth once daily   . metFORMIN (GLUCOPHAGE-XR) 500 MG 24 hr tablet Take 2 tablets (1,000 mg total) by mouth 2 (two) times daily.   . metoprolol tartrate (LOPRESSOR) 25 MG tablet Take 25 mg by mouth 2 (two) times daily.   Letta Pate  VERIO test strip USE 1 STRIP TO CHECK GLUCOSE TWICE DAILY (Patient taking differently: 1 each by Other route 2 (two) times daily. )   . pantoprazole (PROTONIX) 40 MG tablet Take 1 tablet (40 mg total) by mouth 2 (two) times daily.   . simvastatin (ZOCOR) 10 MG tablet TAKE 1 TABLET BY MOUTH AT BEDTIME   . triamcinolone cream (KENALOG) 0.1 % APPLY  CREAM TOPICALLY TWICE DAILY   . fluticasone (CUTIVATE) 0.05 % cream Apply 1 application topically as needed. Use as directed. (Patient not taking: Reported on 06/07/2019)   . fluticasone furoate-vilanterol (BREO ELLIPTA) 200-25 MCG/INH AEPB Inhale 1 puff into the lungs daily. (Patient not taking: Reported on 05/31/2019)   . nystatin cream (MYCOSTATIN) Apply 1 application topically 2 (two) times daily. (Patient not taking: Reported on 05/31/2019)    No facility-administered encounter medications on file as of 06/07/2019.   She has been told she should wait 90 days after her positive COVID test to take the vaccination which will be up in 10 days and she plans on getting her first dose.  I have advised her to let her MDs know about the HAs and nueropathy of her R arm and hand.  Will send this note to Dr. Lorin Picket.  Will call pt again in one month.  Zara Council. Burgess Estelle, MSN, [facility] Gerontological Nurse Practitioner [facility] [phone]

## 2019-06-08 ENCOUNTER — Telehealth: Payer: Self-pay | Admitting: Internal Medicine

## 2019-06-08 NOTE — Telephone Encounter (Signed)
Please call pt.  I received notification (from [facility] outreach phone call)  that she is having headaches, arm numbness, etc.  Need more information regarding her symptoms.  May need appt to discuss.

## 2019-06-08 NOTE — Telephone Encounter (Signed)
Called patient for more info. She stated that she told the person she spoke with that these are not new symptoms. They are symptoms that she has been having since she was diagnosed with COVID and they have all gradually gotten better. No headaches at this time. Numbness in her right arm has gotten significantly better. Swallowing study came back ok. Patient stated that she is feeling better than she has since she was diagnosed with COVID. Does not need appt at this time. Will call if she needs anything.

## 2019-06-10 ENCOUNTER — Other Ambulatory Visit: Payer: Self-pay | Admitting: Pharmacy Technician

## 2019-06-10 ENCOUNTER — Ambulatory Visit: Payer: Medicare HMO | Admitting: *Deleted

## 2019-06-10 NOTE — Patient Outreach (Signed)
[facility]) Care Management  06/10/2019  Beth Tyler 08/30/1945 [id number]    Care coordination call placed to [name] in regards to patient's [name] and Basaglar.  Spoke to [name] who informed patient was APPROVED 05/27/2019-04/07/2020. [name] informed a request was made to the pharmacy but she had no other shipment or tracking information.  Will outreach patient with this information.  Copelan Maultsby P. Brion Hedges, CPhT [facility]  [phone]

## 2019-06-10 NOTE — Patient Outreach (Signed)
[facility]) Care Management  06/10/2019  Beth Tyler September 07, 1945 [id number]    ADDENDUM   Successful call placed to patient regarding patient assistance medication delivery of [name] and Basaglar from [employer], HIPAA identifiers verified.   Patient informed she received shipment of both medications. Discussed refill procedure with patient. She informed she remembers the process from yesterday and verbalized understanding. Patient informed she has been feeling better since her provider's have discontinue her HCTZ, Lisinopril and Actos. She reports her blood pressures have been good. She informs she still has some fatigue but not as bad as it was, some right arm pain and occasional headaches. She informs overall she is feeling better. Will route note to embedded [facility] RPh Catie [place] as FYI.  Follow up:  Will route note to embedded [facility] RPh Catie Feliz Beam for case closure as patient assistance has been completed. Will remove myself from care team.  Stacie Acres. Pedro [place], CPhT [facility]  [phone]

## 2019-06-14 DIAGNOSIS — K580 Irritable bowel syndrome with diarrhea: Secondary | ICD-10-CM | POA: Diagnosis not present

## 2019-06-14 DIAGNOSIS — K8681 Exocrine pancreatic insufficiency: Secondary | ICD-10-CM | POA: Diagnosis not present

## 2019-06-14 DIAGNOSIS — K219 Gastro-esophageal reflux disease without esophagitis: Secondary | ICD-10-CM | POA: Diagnosis not present

## 2019-06-18 ENCOUNTER — Ambulatory Visit
Admission: RE | Admit: 2019-06-18 | Discharge: 2019-06-18 | Disposition: A | Discharge status: Home / Self Care | Payer: Medicare HMO | Source: Ambulatory Visit | Attending: Internal Medicine | Admitting: Internal Medicine

## 2019-06-18 DIAGNOSIS — Z1231 Encounter for screening mammogram for malignant neoplasm of breast: Secondary | ICD-10-CM | POA: Diagnosis not present

## 2019-06-22 ENCOUNTER — Other Ambulatory Visit: Payer: Self-pay | Admitting: Internal Medicine

## 2019-06-22 ENCOUNTER — Encounter: Payer: Self-pay | Admitting: Internal Medicine

## 2019-06-23 MED ORDER — TRIAMCINOLONE ACETONIDE 0.1 % EX CREA: TOPICAL_CREAM | CUTANEOUS | 0 refills | Status: DC

## 2019-06-24 ENCOUNTER — Ambulatory Visit: Payer: Self-pay | Admitting: Pharmacist

## 2019-06-24 DIAGNOSIS — E139 Other specified diabetes mellitus without complications: Secondary | ICD-10-CM

## 2019-06-24 NOTE — Progress Notes (Signed)
Reviewed information.  Agree with plan.    Dr Darnelle Derrick 

## 2019-06-24 NOTE — Patient Instructions (Addendum)
Visit Information  Goals Addressed            This Visit's Progress     Patient Stated   . "I can't afford my medications" (pt-stated)       CARE PLAN ENTRY (see longtitudinal plan of care for additional care plan information)  Current Barriers:  . Diabetes: controlled (appropriate goal <7.5%); most recent A1c 7.3% . Current antihyperglycemic regimen: Basaglar 26 units daily, [name] 3-8 units PRN, pioglitazone 15 mg, metformin 1000 mg BID o GLP1 not a option d/t significant IBS. Renal function low for SGLT2 to provide glycemic benefit . Cardiovascular risk reduction: o Current hypertensive regimen: amlodipine 10 mg QPM, metoprolol tartrate 25 mg BID; reports all BP readings 120-130s/70s o Current hyperlipidemia regimen: simvastatin 10 mg daily; LDL at goal <70 on last check . Atrial Fibrillation: Eliquis 5 mg BID; reports last copay was $141 . Pancreatic insufficiency: [name] w/ meals snacks; receiving via [name] patient assistance program - reports  . S/p COVID: was given Breo at the hospital for COVID. Notes that she has not needed to use it since coming home  Pharmacist Clinical Goal(s):  Marland Kitchen Over the next 90 days, patient with work with PharmD and primary care provider to address optimized glycemic management  Interventions: . Received call from patient regarding needing an updated prescription sent to [facility] patient assistance. Contacted [name], confirmed that they will accept a faxed prescription, as long as it specifically states the total #capsules daily. Will call [facility] gastro tomorrow during normal business hours to ensure they will send her new prescription appropriately.   Patient Self Care Activities:  . Patient will check blood glucose QID, document, and provide at future appointments . Patient will take medications as prescribed . Patient will report any questions or concerns to provider   Please see past updates related to this goal by clicking on the "Past Updates"  button in the selected goal         Patient verbalizes understanding of instructions provided today.   Plan:  - Will collaborate w/ Su Monks tomorrow  Catie Feliz Beam, PharmD, [place], CPP Clinical Pharmacist [facility] [employer] [phone]

## 2019-06-24 NOTE — Chronic Care Management (AMB) (Signed)
Chronic Care Management   Follow Up Note   06/24/2019 Name: Beth Tyler MRN: [id number] DOB: [birth year]  Referred by: Dale Broadwell, MD Reason for referral : Chronic Care Management (Medication Management)   Beth Tyler is a 74 y.o. year old female who is a primary care patient of Dale [place], MD. The CCM team was consulted for assistance with chronic disease management and care coordination needs.    Received call from patient today.   Review of patient status, including review of consultants reports, relevant laboratory and other test results, and collaboration with appropriate care team members and the patient's provider was performed as part of comprehensive patient evaluation and provision of chronic care management services.    SDOH (Social Determinants of Health) assessments performed: Yes See Care Plan activities for detailed interventions related to [facility])     Outpatient Encounter Medications as of 06/24/2019  Medication Sig Note  . acetaminophen (TYLENOL) 500 MG tablet Take 500-1,000 mg by mouth every 6 (six) hours as needed for mild pain or moderate pain.    Marland Kitchen amLODipine (NORVASC) 10 MG tablet Take 10 mg by mouth daily. 05/31/2019: QPM  . apixaban (ELIQUIS) 5 MG TABS tablet Take 1 tablet (5 mg total) by mouth 2 (two) times daily.   . fluticasone (CUTIVATE) 0.05 % cream Apply 1 application topically as needed. Use as directed. (Patient not taking: Reported on 06/07/2019)   . fluticasone furoate-vilanterol (BREO ELLIPTA) 200-25 MCG/INH AEPB Inhale 1 puff into the lungs daily. (Patient not taking: Reported on 05/31/2019)   . Insulin Glargine (BASAGLAR KWIKPEN) 100 UNIT/ML SOPN Inject 0.28 mLs (28 Units total) into the skin every morning. 05/31/2019: 26 units  . insulin lispro ([name]) 100 UNIT/ML injection Inject 0.04-0.08 mLs (4-8 Units total) into the skin 3 (three) times daily with meals. 05/31/2019: 3-8 units  . Insulin Pen Needle 32G X 4 MM MISC Use to inject  insulin three times daily   . lipase/protease/amylase ([name]) 36000 UNITS CPEP capsule Take 36,000-72,000 Units by mouth See admin instructions. Take 2 capsules (72000 units) by mouth three times daily with meals and take 1 capsule (36000 units) by mouth daily with snacks   . magnesium oxide (MAG-OX) 400 (241.3 Mg) MG tablet Take 1 tablet by mouth once daily   . metFORMIN (GLUCOPHAGE-XR) 500 MG 24 hr tablet Take 2 tablets (1,000 mg total) by mouth 2 (two) times daily.   . metoprolol tartrate (LOPRESSOR) 25 MG tablet Take 25 mg by mouth 2 (two) times daily.   Marland Kitchen nystatin cream (MYCOSTATIN) Apply 1 application topically 2 (two) times daily. (Patient not taking: Reported on 05/31/2019)   . ONETOUCH VERIO test strip USE 1 STRIP TO CHECK GLUCOSE TWICE DAILY (Patient taking differently: 1 each by Other route 2 (two) times daily. )   . pantoprazole (PROTONIX) 40 MG tablet Take 1 tablet (40 mg total) by mouth 2 (two) times daily.   . simvastatin (ZOCOR) 10 MG tablet TAKE 1 TABLET BY MOUTH AT BEDTIME   . triamcinolone cream (KENALOG) 0.1 % APPLY  CREAM TOPICALLY TWICE DAILY    No facility-administered encounter medications on file as of 06/24/2019.     Objective:   Goals Addressed            This Visit's Progress     Patient Stated   . "I can't afford my medications" (pt-stated)       CARE PLAN ENTRY (see longtitudinal plan of care for additional care plan information)  Current Barriers:  .  Diabetes: controlled (appropriate goal <7.5%); most recent A1c 7.3% . Current antihyperglycemic regimen: Basaglar 26 units daily, [name] 3-8 units PRN, pioglitazone 15 mg, metformin 1000 mg BID o GLP1 not a option d/t significant IBS. Renal function low for SGLT2 to provide glycemic benefit . Cardiovascular risk reduction: o Current hypertensive regimen: amlodipine 10 mg QPM, metoprolol tartrate 25 mg BID; reports all BP readings 120-130s/70s o Current hyperlipidemia regimen: simvastatin 10 mg daily; LDL at  goal <70 on last check . Atrial Fibrillation: Eliquis 5 mg BID; reports last copay was $141 . Pancreatic insufficiency: [name] w/ meals snacks; receiving via [name] patient assistance program - reports  . S/p COVID: was given Breo at the hospital for [place]. Notes that she has not needed to use it since coming home  Pharmacist Clinical Goal(s):  Marland Kitchen Over the next 90 days, patient with work with PharmD and primary care provider to address optimized glycemic management  Interventions: . Received call from patient regarding needing an updated prescription sent to [facility] patient assistance. Contacted [name], confirmed that they will accept a faxed prescription, as long as it specifically states the total #capsules daily. Will call [facility] gastro tomorrow during normal business hours to ensure they will send her new prescription appropriately.   Patient Self Care Activities:  . Patient will check blood glucose QID, document, and provide at future appointments . Patient will take medications as prescribed . Patient will report any questions or concerns to provider   Please see past updates related to this goal by clicking on the "Past Updates" button in the selected goal          Plan:  - Will collaborate w/ [place], PharmD, Para [place], [place] Pharmacist [place] [phone]

## 2019-06-25 ENCOUNTER — Ambulatory Visit (INDEPENDENT_AMBULATORY_CARE_PROVIDER_SITE_OTHER): Payer: Medicare HMO | Admitting: Pharmacist

## 2019-06-25 DIAGNOSIS — E139 Other specified diabetes mellitus without complications: Secondary | ICD-10-CM

## 2019-06-25 NOTE — Progress Notes (Signed)
Reviewed information.  Agree with assessment and plan.    Dr El Pile 

## 2019-06-25 NOTE — Chronic Care Management (AMB) (Signed)
Chronic Care Management   Follow Up Note   06/25/2019 Name: Beth Tyler MRN: [id number] DOB: 04/22/1945  Referred by: Dale [place], MD Reason for referral : Chronic Care Management (Medication Management)   Beth Tyler is a 74 y.o. year old female who is a primary care patient of Dale , MD. The CCM team was consulted for assistance with chronic disease management and care coordination needs.    Care coordination completed today.   Review of patient status, including review of consultants reports, relevant laboratory and other test results, and collaboration with appropriate care team members and the patient's provider was performed as part of comprehensive patient evaluation and provision of chronic care management services.    SDOH (Social Determinants of Health) assessments performed: No See Care Plan activities for detailed interventions related to [facility])     Outpatient Encounter Medications as of 06/25/2019  Medication Sig Note  . acetaminophen (TYLENOL) 500 MG tablet Take 500-1,000 mg by mouth every 6 (six) hours as needed for mild pain or moderate pain.    Marland Kitchen amLODipine (NORVASC) 10 MG tablet Take 10 mg by mouth daily. 05/31/2019: QPM  . apixaban (ELIQUIS) 5 MG TABS tablet Take 1 tablet (5 mg total) by mouth 2 (two) times daily.   . fluticasone (CUTIVATE) 0.05 % cream Apply 1 application topically as needed. Use as directed. (Patient not taking: Reported on 06/07/2019)   . fluticasone furoate-vilanterol (BREO ELLIPTA) 200-25 MCG/INH AEPB Inhale 1 puff into the lungs daily. (Patient not taking: Reported on 05/31/2019)   . Insulin Glargine (BASAGLAR KWIKPEN) 100 UNIT/ML SOPN Inject 0.28 mLs (28 Units total) into the skin every morning. 05/31/2019: 26 units  . insulin lispro ([name]) 100 UNIT/ML injection Inject 0.04-0.08 mLs (4-8 Units total) into the skin 3 (three) times daily with meals. 05/31/2019: 3-8 units  . Insulin Pen Needle 32G X 4 MM MISC Use to inject  insulin three times daily   . lipase/protease/amylase ([name]) 36000 UNITS CPEP capsule Take 36,000-72,000 Units by mouth See admin instructions. Take 2 capsules (72000 units) by mouth three times daily with meals and take 1 capsule (36000 units) by mouth daily with snacks   . magnesium oxide (MAG-OX) 400 (241.3 Mg) MG tablet Take 1 tablet by mouth once daily   . metFORMIN (GLUCOPHAGE-XR) 500 MG 24 hr tablet Take 2 tablets (1,000 mg total) by mouth 2 (two) times daily.   . metoprolol tartrate (LOPRESSOR) 25 MG tablet Take 25 mg by mouth 2 (two) times daily.   Marland Kitchen nystatin cream (MYCOSTATIN) Apply 1 application topically 2 (two) times daily. (Patient not taking: Reported on 05/31/2019)   . ONETOUCH VERIO test strip USE 1 STRIP TO CHECK GLUCOSE TWICE DAILY (Patient taking differently: 1 each by Other route 2 (two) times daily. )   . pantoprazole (PROTONIX) 40 MG tablet Take 1 tablet (40 mg total) by mouth 2 (two) times daily.   . simvastatin (ZOCOR) 10 MG tablet TAKE 1 TABLET BY MOUTH AT BEDTIME   . triamcinolone cream (KENALOG) 0.1 % APPLY  CREAM TOPICALLY TWICE DAILY    No facility-administered encounter medications on file as of 06/25/2019.     Objective:   Goals Addressed            This Visit's Progress     Patient Stated   . "I can't afford my medications" (pt-stated)       CARE PLAN ENTRY (see longtitudinal plan of care for additional care plan information)  Current Barriers:  .  Diabetes: controlled (appropriate goal <7.5%); most recent A1c 7.3% . Current antihyperglycemic regimen: Basaglar 26 units daily, [name] 3-8 units PRN, pioglitazone 15 mg, metformin 1000 mg BID o GLP1 not a option d/t significant IBS. Renal function low for SGLT2 to provide glycemic benefit . Cardiovascular risk reduction: o Current hypertensive regimen: amlodipine 10 mg QPM, metoprolol tartrate 25 mg BID; reports all BP readings 120-130s/70s o Current hyperlipidemia regimen: simvastatin 10 mg daily; LDL at  goal <70 on last check . Atrial Fibrillation: Eliquis 5 mg BID; reports last copay was $141 . Pancreatic insufficiency: [name] w/ meals snacks; receiving via [name] patient assistance program - reports dose was increased to 3 capsules w/ meals, and wonders how to get an updated prescription to [name]. . S/p COVID: was given Breo at the hospital for [place]. Notes that she has not needed to use it since coming home  Pharmacist Clinical Goal(s):  Marland Kitchen Over the next 90 days, patient with work with PharmD and primary care provider to address optimized glycemic management  Interventions: . Contacted [place]. Relayed patient's concerns about needing to fax updated script to [facility] patient assistance, and provided my callback for questions. Updated patient.   Patient Self Care Activities:  . Patient will check blood glucose QID, document, and provide at future appointments . Patient will take medications as prescribed . Patient will report any questions or concerns to provider   Please see past updates related to this goal by clicking on the "Past Updates" button in the selected goal          Plan:  - Will outreach patient as previously scheduled.  Catie Darnelle Maffucci, PharmD, [place], CPP Clinical Pharmacist [place] [phone]

## 2019-06-25 NOTE — Patient Instructions (Signed)
Visit Information  Goals Addressed            This Visit's Progress     Patient Stated   . "I can't afford my medications" (pt-stated)       CARE PLAN ENTRY (see longtitudinal plan of care for additional care plan information)  Current Barriers:  . Diabetes: controlled (appropriate goal <7.5%); most recent A1c 7.3% . Current antihyperglycemic regimen: Basaglar 26 units daily, [name] 3-8 units PRN, pioglitazone 15 mg, metformin 1000 mg BID o GLP1 not a option d/t significant IBS. Renal function low for SGLT2 to provide glycemic benefit . Cardiovascular risk reduction: o Current hypertensive regimen: amlodipine 10 mg QPM, metoprolol tartrate 25 mg BID; reports all BP readings 120-130s/70s o Current hyperlipidemia regimen: simvastatin 10 mg daily; LDL at goal <70 on last check . Atrial Fibrillation: Eliquis 5 mg BID; reports last copay was $141 . Pancreatic insufficiency: [name] w/ meals snacks; receiving via [name] patient assistance program - reports dose was increased to 3 capsules w/ meals, and wonders how to get an updated prescription to [name]. . S/p COVID: was given Breo at the hospital for COVID. Notes that she has not needed to use it since coming home  Pharmacist Clinical Goal(s):  Marland Kitchen Over the next 90 days, patient with work with PharmD and primary care provider to address optimized glycemic management  Interventions: . Contacted [facility]. Relayed patient's concerns about needing to fax updated script to [facility] patient assistance, and provided my callback for questions. Updated patient.   Patient Self Care Activities:  . Patient will check blood glucose QID, document, and provide at future appointments . Patient will take medications as prescribed . Patient will report any questions or concerns to provider   Please see past updates related to this goal by clicking on the "Past Updates" button in the selected goal         Patient verbalizes understanding of  instructions provided today.    Plan:  - Will outreach patient as previously scheduled.  Catie Feliz Beam, PharmD, [place], CPP Clinical Pharmacist [facility] [place] [employer] [phone]

## 2019-06-28 ENCOUNTER — Other Ambulatory Visit: Payer: Self-pay | Admitting: Internal Medicine

## 2019-06-28 DIAGNOSIS — R69 Illness, unspecified: Secondary | ICD-10-CM | POA: Diagnosis not present

## 2019-06-28 NOTE — Telephone Encounter (Signed)
Patient stated she was concerned about taking her blood thinner and getting the shot and also wanted to make sure you are ok for her to get the vaccine with her having elevated HR every now and then. Patient did not get vaccine today- she originally had appt for May 8 so she cancelled the one for today and keep the appt for May 8. Patient stated she has slowly gotten better over time so maybe by May she will feel even better

## 2019-06-28 NOTE — Telephone Encounter (Signed)
Please call pt.  Please find out her concerns.

## 2019-07-04 ENCOUNTER — Ambulatory Visit: Payer: Medicare HMO

## 2019-07-05 ENCOUNTER — Ambulatory Visit: Payer: Medicare HMO | Admitting: Pharmacist

## 2019-07-05 DIAGNOSIS — E139 Other specified diabetes mellitus without complications: Secondary | ICD-10-CM | POA: Diagnosis not present

## 2019-07-05 DIAGNOSIS — J849 Interstitial pulmonary disease, unspecified: Secondary | ICD-10-CM

## 2019-07-05 DIAGNOSIS — I4891 Unspecified atrial fibrillation: Secondary | ICD-10-CM | POA: Diagnosis not present

## 2019-07-05 DIAGNOSIS — I700 Atherosclerosis of aorta: Secondary | ICD-10-CM

## 2019-07-05 DIAGNOSIS — K589 Irritable bowel syndrome without diarrhea: Secondary | ICD-10-CM

## 2019-07-05 NOTE — Patient Instructions (Signed)
Visit Information  Goals Addressed            This Visit's Progress     Patient Stated   . "I can't afford my medications" (pt-stated)       CARE PLAN ENTRY (see longtitudinal plan of care for additional care plan information)   Current Barriers:  . Diabetes: controlled (appropriate goal <7.5%); most recent A1c 7.3% o Notes financial concerns today with medical bills, including worrying about hitting the Medicare Coverage Gap o Has NOT received increased dose prescription for [name] yet. This was faxed in by [facility] on 06/25/19 o Does report some occasional ankle swelling. F/u with cardiology in May. Notes that elevating her feet helps some. . Current antihyperglycemic regimen: Basaglar 28 units daily, [name] 3-8 units PRN, metformin 1000 mg BID o GLP1 not a option d/t significant IBS. Renal function low for SGLT2 to provide glycemic benefit . Current glucose readings:  o Checking fasting, before meals, bedtime (QID); Does report some episodes of hypoglycemia. She worries about going low overnight and not waking up . Cardiovascular risk reduction: new onset Afib o Current hypertensive regimen: amlodipine 10 mg QPM, metoprolol tartrate 25 mg BID; reports BP continues to be well controlled o Current hyperlipidemia regimen: simvastatin 10 mg daily; LDL at goal <70 on last check . Atrial Fibrillation: Eliquis 5 mg BID; reports significant concern regarding her out of pocket spend request . Pancreatic insufficiency: [name] 3 capsules w/ meals and 2 with snacks; waiting on a new increased dose prescription from [facility]   Pharmacist Clinical Goal(s):  Marland Kitchen Over the next 90 days, patient with work with PharmD and primary care provider to address optimized glycemic management  Interventions: . Comprehensive medication review performed, medication list updated in electronic medical record  . Reviewed likely goal A1c <7.5% given comorbidities. Given # of injections of insulin daily (3-4) and number of  checks daily (at least 4), patient may be a good candidate for [employer], particularly the [place] 2 w/ alarms given worries about overnight hypoglycemia. Will collaborate w/ office staff to complete DME order form for Free[employer] Libre 2 and [facility] to [facility]. Pending patient's out of pocket cost, this may be appropriate therapy to help monitor glucose for her . Reviewed requirements for Eliquis assistance. Will start the process of applying for Eliquis assistance, as the company will be able to tell us exactly how much more the patient needs to spend before she qualifies, and this will help her financial planning. Will collaborate w/ CPhT to mail patient her portion of the application, and fax provider portion to Dr. Darrold Junker . Patient reports needing to pursue setting up a payment plan with [facility]. Placing Care Guide referral for community resource assisatnce . Contacted [name] assistance for [name]. They are processing her order. I requested an expedited fill - they will likely fill tomorrow, and ship out. Medication should arrive within 48 business hours. Contacted patient and informed her of this . Reviewed upcoming appointments: PCP, cardiology, GI. Encouraged continued f/u with cardiology s/p COVID.   Patient Self Care Activities:  . Patient will check blood glucose QID, document, and provide at future appointments . Patient will take medications as prescribed . Patient will report any questions or concerns to provider   Please see past updates related to this goal by clicking on the "Past Updates" button in the selected goal         Patient verbalizes understanding of instructions provided today.   Plan:  -  Scheduled f/u call 08/19/19  Catie Darnelle Maffucci, PharmD, Para [place], [place] Pharmacist [place] [phone]

## 2019-07-05 NOTE — Chronic Care Management (AMB) (Signed)
Chronic Beth Management   Follow Up Note   07/05/2019 Name: Beth Tyler MRN: [id number] DOB: 04/05/1946  Referred by: Beth [place], MD Reason for referral : Chronic Beth Management (Medication Management)   Beth Tyler is a 74 y.o. year old female who is a primary Beth patient of Beth [place], MD. The CCM team was consulted for assistance with chronic disease management and Beth coordination needs.    Review of patient status, including review of consultants reports, relevant laboratory and other test results, and collaboration with appropriate Beth team members and the patient's provider was performed as part of comprehensive patient evaluation and provision of chronic Beth management services.    SDOH (Social Determinants of Health) assessments performed: Yes See Beth Plan activities for detailed interventions related to Beth Tyler)     Outpatient Encounter Medications as of 07/05/2019  Medication Sig Note  . amLODipine (NORVASC) 10 MG tablet Take 1 tablet by mouth once daily   . apixaban (ELIQUIS) 5 MG TABS tablet Take 1 tablet (5 mg total) by mouth Tyler (two) times daily.   . Insulin Glargine (BASAGLAR KWIKPEN) 100 UNIT/ML SOPN Inject 0.28 mLs (28 Units total) into the skin every morning.   . insulin lispro ([name]) 100 UNIT/ML injection Inject 0.04-0.08 mLs (4-8 Units total) into the skin 3 (three) times daily with meals. 07/05/2019: Using BID  . Insulin Pen Needle 32G X 4 MM MISC Use to inject insulin three times daily   . lipase/protease/amylase ([name]) 36000 UNITS CPEP capsule Take 36,000-72,000 Units by mouth See admin instructions. Take 3 capsules by mouth three times daily with meals and take Tyler capsule by mouth daily with snacks   . magnesium oxide (MAG-OX) 400 (241.3 Mg) MG tablet Take 1 tablet by mouth once daily   . metFORMIN (GLUCOPHAGE-XR) 500 MG 24 hr tablet Take Tyler tablets (1,000 mg total) by mouth Tyler (two) times daily.   . metoprolol tartrate (LOPRESSOR) 25 MG  tablet Take 25 mg by mouth Tyler (two) times daily.   Marland Kitchen nystatin cream (MYCOSTATIN) Apply 1 application topically Tyler (two) times daily.   Letta Pate VERIO test strip USE 1 STRIP TO CHECK GLUCOSE TWICE DAILY (Patient taking differently: 1 each by Other route Tyler (two) times daily. )   . pantoprazole (PROTONIX) 40 MG tablet TAKE 1 TABLET BY MOUTH TWICE DAILY BEFORE MEAL(S)   . simvastatin (ZOCOR) 10 MG tablet TAKE 1 TABLET BY MOUTH AT BEDTIME   . triamcinolone cream (KENALOG) 0.1 % APPLY  CREAM TOPICALLY TWICE DAILY   . acetaminophen (TYLENOL) 500 MG tablet Take 500-1,000 mg by mouth every 6 (six) hours as needed for mild pain or moderate pain.    . fluticasone (CUTIVATE) 0.05 % cream Apply 1 application topically as needed. Use as directed. (Patient not taking: Reported on 06/07/2019)   . [DISCONTINUED] fluticasone furoate-vilanterol (BREO ELLIPTA) 200-25 MCG/INH AEPB Inhale 1 puff into the lungs daily. (Patient not taking: Reported on Tyler/22/2021)    No facility-administered encounter medications on file as of 07/05/2019.     Objective:   Goals Addressed            This Visit's Progress     Patient Stated   . "I can't afford my medications" (pt-stated)       Beth PLAN ENTRY (see longtitudinal plan of Beth for additional Beth plan information)   Current Barriers:  . Diabetes: controlled (appropriate goal <7.5%); most recent A1c 7.3% o Notes financial concerns today with medical bills, including worrying  about hitting the Medicare Coverage Gap o Has NOT received increased dose prescription for [name] yet. This was faxed in by Beth Tyler on 06/25/19 o Does report some occasional ankle swelling. F/u with cardiology in May. . Current antihyperglycemic regimen: Basaglar 28 units daily, [name] 3-8 units PRN, metformin 1000 mg BID o GLP1 not a option d/t significant IBS. Renal function low for SGLT2 to provide glycemic benefit . Current glucose readings:  o Checking fasting, before meals, bedtime (QID);  Does report some episodes of hypoglycemia. She worries about going low overnight and not waking up . Cardiovascular risk reduction: new onset Afib o Current hypertensive regimen: amlodipine 10 mg QPM, metoprolol tartrate 25 mg BID; reports BP continues to be well controlled o Current hyperlipidemia regimen: simvastatin 10 mg daily; LDL at goal <70 on last check . Atrial Fibrillation: Eliquis 5 mg BID; reports significant concern regarding her out of pocket spend request . Pancreatic insufficiency: [name] 3 capsules w/ meals and Tyler with snacks; waiting on a new increased dose prescription from [place]):  Marland Kitchen Over the next 90 days, patient with work with PharmD and primary Beth provider to address optimized glycemic management  Interventions: . Comprehensive medication review performed, medication list updated in electronic medical record  . Reviewed likely goal A1c <7.5% given comorbidities. Given # of injections of insulin daily (3-4) and number of checks daily (at least 4), patient may be a good candidate for Beth Tyler, particularly the Beth Tyler w/ alarms given worries about overnight hypoglycemia. Will collaborate w/ office staff to complete DME order form for Beth Tyler and send to Beth Tyler. Pending patient's out of pocket cost, this may be appropriate therapy to help monitor glucose for her . Reviewed requirements for Eliquis assistance. Will start the process of applying for Eliquis assistance, as the company will be able to tell us exactly how much more the patient needs to spend before she qualifies, and this will help her financial planning. Will collaborate w/ Beth Tyler to mail patient her portion of the application, and fax provider portion to Beth Tyler . Patient reports needing to pursue setting up a payment plan with Beth Tyler. Placing Beth Tyler referral for community resource assisatnce . Contacted Beth Tyler assistance for [name]. They are  processing her order. I requested an expedited fill - they will likely fill tomorrow, and ship out. Medication should arrive within 48 business hours. Contacted patient and informed her of this . Reviewed upcoming appointments: PCP, cardiology, Tyler  Patient Self Beth Activities:  . Patient will check blood glucose QID, document, and provide at future appointments . Patient will take medications as prescribed . Patient will report any questions or concerns to provider   Please see past updates related to this goal by clicking on the "Past Updates" button in the selected goal          Plan:  - Scheduled f/u call 08/19/19  Catie Darnelle Maffucci, PharmD, BCACP, [place] Pharmacist [place] [phone]

## 2019-07-06 ENCOUNTER — Encounter: Payer: Self-pay | Admitting: Internal Medicine

## 2019-07-06 ENCOUNTER — Other Ambulatory Visit: Payer: Self-pay | Admitting: Pharmacy Technician

## 2019-07-06 NOTE — Progress Notes (Signed)
Reviewed information.  Agree with assessment and plan.    Dr Tinslee Klare 

## 2019-07-06 NOTE — Patient Outreach (Signed)
[facility]) Care Management  07/06/2019  Beth Tyler 1945-05-12 [id number]                                       Medication Assistance Referral  Referral From: [facility] Embedded RPh Catie T.   Medication/Company: Eliquis / [employer] Patient application portion:  Mailed Provider application portion: Faxed  to Dr. Marcina Millard Provider address/fax verified via: Office website     Follow up:  Will follow up with patient in 10-14 business days to confirm application(s) have been received.  Algie Westry P. Raeshawn Vo, CPhT [facility] [employer]  [phone]

## 2019-07-07 ENCOUNTER — Telehealth: Payer: Self-pay

## 2019-07-07 NOTE — Telephone Encounter (Signed)
Pt scheduled for virtual tomorrow with Dr Lorin Picket. Advised she is some better today, using heat and tylenol.

## 2019-07-07 NOTE — Telephone Encounter (Signed)
07/07/2019 Spoke with patient and assisted her with completing the  [facility] Application. Informed patient she would need to sign the paperwork and attach the necessary paperwork which is listed on the application. Emailed application to [employer] to mail out to patient. [place] [phone]

## 2019-07-08 ENCOUNTER — Telehealth: Payer: Self-pay | Admitting: Internal Medicine

## 2019-07-08 ENCOUNTER — Encounter: Payer: Self-pay | Admitting: Internal Medicine

## 2019-07-08 ENCOUNTER — Ambulatory Visit: Payer: Self-pay | Admitting: Pharmacist

## 2019-07-08 ENCOUNTER — Telehealth (INDEPENDENT_AMBULATORY_CARE_PROVIDER_SITE_OTHER): Payer: Medicare HMO | Admitting: Internal Medicine

## 2019-07-08 DIAGNOSIS — Z794 Long term (current) use of insulin: Secondary | ICD-10-CM

## 2019-07-08 DIAGNOSIS — N183 Chronic kidney disease, stage 3 unspecified: Secondary | ICD-10-CM

## 2019-07-08 DIAGNOSIS — E1122 Type 2 diabetes mellitus with diabetic chronic kidney disease: Secondary | ICD-10-CM

## 2019-07-08 DIAGNOSIS — M545 Low back pain, unspecified: Secondary | ICD-10-CM

## 2019-07-08 DIAGNOSIS — E139 Other specified diabetes mellitus without complications: Secondary | ICD-10-CM

## 2019-07-08 DIAGNOSIS — I480 Paroxysmal atrial fibrillation: Secondary | ICD-10-CM

## 2019-07-08 DIAGNOSIS — I10 Essential (primary) hypertension: Secondary | ICD-10-CM | POA: Diagnosis not present

## 2019-07-08 MED ORDER — TIZANIDINE HCL 2 MG PO CAPS: 2.0000 mg | ORAL_CAPSULE | Freq: Every day | ORAL | 0 refills | Status: DC

## 2019-07-08 NOTE — Telephone Encounter (Signed)
Letter mailed to patient.

## 2019-07-08 NOTE — Telephone Encounter (Signed)
Pt has been evaluated

## 2019-07-08 NOTE — Progress Notes (Signed)
Patient ID: Beth Tyler, female   DOB: [birth year], 74 y.o.   MRN: [id number]   Virtual Visit via video Note  This visit type was conducted due to national recommendations for restrictions regarding the COVID-19 pandemic (e.g. social distancing).  This format is felt to be most appropriate for this patient at this time.  All issues noted in this document were discussed and addressed.  No physical exam was performed (except for noted visual exam findings with Video Visits).   I connected with Beth Tyler by a video enabled telemedicine application and verified that I am speaking with the correct person using two identifiers. Location patient: home Location provider: work  Persons participating in the virtual visit: patient, provider  The limitations, risks, security and privacy concerns of performing an evaluation and management service by video and the availability of in person appointments have been discussed. The patient expressed understanding and agreed to proceed.   Reason for visit: work in appt.   HPI: States that over the last 5 days she has experienced pain right lower back into buttock.  No pain going down leg.  States had similar pain last year.  Was told sciatica.  Resolve din 5-6 days.  She reports persistent pain.  Has not been able to get up by herself secondary to pain.  Today was able to stand.  States feels like her muscle is pulling.  Will feel a grabbing sensation.  Has been unable to bear weight.  A little better today.  Has been using a Production assistant, radio.  Applying heat.  Taking tylenol.  States bloods pressures have been doing well - averaging 120-130s/70s.  Sugars have been up and down.  States had low sugars last month - a few episodes.  Has adjusted her pm insulin and this has not been an issues since she adjusted her dose.  Still recovering from covid.  Feels better.  Still with fatigue.  No nausea.  Eating better.  Some increased heart rate previously.  Called EMTs.  They  checked her.  Has not had issues since.  Denies chest pain.     ROS: See pertinent positives and negatives per HPI.  Past Medical History:  Diagnosis Date  . Anemia   . Arthritis   . B12 deficiency   . Carpal tunnel syndrome, bilateral   . Diabetes mellitus ([place]) 02/17/2012  . Diabetes mellitus, type II ([place])   . Dyspnea   . Gastritis   . GERD (gastroesophageal reflux disease)   . Hypercholesterolemia   . Hypertension   . IBS (irritable bowel syndrome)   . Neuropathy    legs  . Pernicious anemia   . Vertigo    no episodes fo 9 or 10 yrs    Past Surgical History:  Procedure Laterality Date  . ANTERIOR VITRECTOMY Right 12/01/2017   Procedure: ANTERIOR VITRECTOMY;  Surgeon: Eulogio Bear, MD;  Location: [place];  Service: Ophthalmology;  Laterality: Right;  . BREAST BIOPSY Right 1980   neg  . BREAST SURGERY  1980   benign cyst removal  . CATARACT EXTRACTION W/PHACO Left 10/13/2017   Procedure: CATARACT EXTRACTION PHACO AND INTRAOCULAR LENS PLACEMENT ([place])  LEFT  DIABETES;  Surgeon: Eulogio Bear, MD;  Location: [place];  Service: Ophthalmology;  Laterality: Left;  Diabetic - insulin and oral meds  . CATARACT EXTRACTION W/PHACO Right 12/01/2017   Procedure: CATARACT EXTRACTION PHACO AND INTRAOCULAR LENS PLACEMENT ([place])  RIGHT DIABETIC;  Surgeon: Eulogio Bear, MD;  Location:  [place];  Service: Ophthalmology;  Laterality: Right;  Diabetic - insulin and oral  . CHOLECYSTECTOMY  1983    Family History  Problem Relation Age of Onset  . Diabetes Father   . Kidney disease Father   . Heart disease Father   . Alcohol abuse Mother   . Dementia Mother   . Diabetes Mother   . Diabetes Other   . Heart disease Son 74       CABG  . Hemophilia Brother   . Uterine cancer Sister   . Diabetes Sister   . COPD Sister   . Breast cancer Maternal Aunt 51  . Breast cancer Daughter 60  . Stroke Daughter     SOCIAL HX: reviewed.     Current Outpatient Medications:  .  acetaminophen (TYLENOL) 500 MG tablet, Take 500-1,000 mg by mouth every 6 (six) hours as needed for mild pain or moderate pain. , Disp: , Rfl:  .  amLODipine (NORVASC) 10 MG tablet, Take 1 tablet by mouth once daily, Disp: 90 tablet, Rfl: 0 .  apixaban (ELIQUIS) 5 MG TABS tablet, Take 1 tablet (5 mg total) by mouth 2 (two) times daily., Disp: 60 tablet, Rfl: 1 .  fluticasone (CUTIVATE) 0.05 % cream, Apply 1 application topically as needed. Use as directed., Disp: , Rfl:  .  Insulin Glargine (BASAGLAR KWIKPEN) 100 UNIT/ML SOPN, Inject 0.28 mLs (28 Units total) into the skin every morning., Disp: , Rfl:  .  insulin lispro ([name]) 100 UNIT/ML injection, Inject 0.04-0.08 mLs (4-8 Units total) into the skin 3 (three) times daily with meals., Disp: , Rfl:  .  Insulin Pen Needle 32G X 4 MM MISC, Use to inject insulin three times daily, Disp: 300 each, Rfl: 3 .  lipase/protease/amylase ([name]) 36000 UNITS CPEP capsule, Take 35,573-22,025 Units by mouth See admin instructions. Take 3 capsules by mouth three times daily with meals and take 2 capsule by mouth daily with snacks, Disp: , Rfl:  .  magnesium oxide (MAG-OX) 400 (241.3 Mg) MG tablet, Take 1 tablet by mouth once daily, Disp: 90 tablet, Rfl: 0 .  metFORMIN (GLUCOPHAGE-XR) 500 MG 24 hr tablet, Take 2 tablets (1,000 mg total) by mouth 2 (two) times daily., Disp: 360 tablet, Rfl: 3 .  metoprolol tartrate (LOPRESSOR) 25 MG tablet, Take 25 mg by mouth 2 (two) times daily., Disp: , Rfl:  .  ONETOUCH VERIO test strip, USE 1 STRIP TO CHECK GLUCOSE TWICE DAILY (Patient taking differently: 1 each by Other route 2 (two) times daily. ), Disp: 200 each, Rfl: 0 .  pantoprazole (PROTONIX) 40 MG tablet, TAKE 1 TABLET BY MOUTH TWICE DAILY BEFORE MEAL(S), Disp: 180 tablet, Rfl: 0 .  simvastatin (ZOCOR) 10 MG tablet, TAKE 1 TABLET BY MOUTH AT BEDTIME, Disp: 90 tablet, Rfl: 0 .  triamcinolone cream (KENALOG) 0.1 %, APPLY  CREAM  TOPICALLY TWICE DAILY, Disp: 15 g, Rfl: 0 .  tizanidine (ZANAFLEX) 2 MG capsule, Take 1 capsule (2 mg total) by mouth daily., Disp: 15 capsule, Rfl: 0  EXAM:  VITALS per patient if applicable: [id number]  GENERAL: alert, oriented, appears well and in no acute distress  HEENT: atraumatic, conjunttiva clear, no obvious abnormalities on inspection of external nose and ears  NECK: normal movements of the head and neck  LUNGS: on inspection no signs of respiratory distress, breathing rate appears normal, no obvious gross SOB, gasping or wheezing  CV: no obvious cyanosis  PSYCH/NEURO: pleasant and cooperative, no obvious depression or anxiety,  speech and thought processing grossly intact  ASSESSMENT AND PLAN:  Discussed the following assessment and plan:  Back pain Had previous xray that revealed some arthritis changes.  Pain as outlined.  Taking tylenol. Discussed dosing of tylenol.  Add low dose muscle relaxer in the evening.  Follow.    Benign essential hypertension Blood pressure as outlined.  Overall doing better.  On metoprolol and amlodipine.  Follow pressures.  Follow metabolic panel.   Paroxysmal A-fib ([place]) Felt had recent episode.  Has been evaluated by cardiology.  Continue eliquis.  No increased heart rate currently.  Overall feels heart stable.  Follow.    Stage 3 chronic kidney disease Avoid antiinflammatories.  Stay hydrated.  Follow metabolic panel.    Type 2 diabetes mellitus with diabetic chronic kidney disease ([place]) Sugars as otlined.  No low sugars since she adjusted her insuline dosing in the evening.  Low carb diet and exercise.  Follow met b and a1c.     Meds ordered this encounter  Medications  . tizanidine (ZANAFLEX) 2 MG capsule    Sig: Take 1 capsule (2 mg total) by mouth daily.    Dispense:  15 capsule    Refill:  0     I discussed the assessment and treatment plan with the patient. The patient was provided an opportunity to ask questions and  all were answered. The patient agreed with the plan and demonstrated an understanding of the instructions.   The patient was advised to call back or seek an in-person evaluation if the symptoms worsen or if the condition fails to improve as anticipated.    Einar Pheasant, MD

## 2019-07-08 NOTE — Telephone Encounter (Signed)
Please call pt back-not sure if she needs her 12:00 appt or not

## 2019-07-08 NOTE — Chronic Care Management (AMB) (Signed)
Chronic Care Management   Follow Up Note   07/08/2019 Name: Beth Tyler MRN: [id number] DOB: 12-Jul-1945  Referred by: Dale Wooster, MD Reason for referral : Chronic Care Management (Medication Management)   Beth Tyler is a 74 y.o. year old female who is a primary care patient of Dale Woodburn, MD. The CCM team was consulted for assistance with chronic disease management and care coordination needs.    Medication access needs.   Review of patient status, including review of consultants reports, relevant laboratory and other test results, and collaboration with appropriate care team members and the patient's provider was performed as part of comprehensive patient evaluation and provision of chronic care management services.    SDOH (Social Determinants of Health) assessments performed: No See Care Plan activities for detailed interventions related to [facility])     Outpatient Encounter Medications as of 07/08/2019  Medication Sig Note  . acetaminophen (TYLENOL) 500 MG tablet Take 500-1,000 mg by mouth every 6 (six) hours as needed for mild pain or moderate pain.    Marland Kitchen amLODipine (NORVASC) 10 MG tablet Take 1 tablet by mouth once daily   . apixaban (ELIQUIS) 5 MG TABS tablet Take 1 tablet (5 mg total) by mouth 2 (two) times daily.   . fluticasone (CUTIVATE) 0.05 % cream Apply 1 application topically as needed. Use as directed.   . Insulin Glargine (BASAGLAR KWIKPEN) 100 UNIT/ML SOPN Inject 0.28 mLs (28 Units total) into the skin every morning.   . insulin lispro ([name]) 100 UNIT/ML injection Inject 0.04-0.08 mLs (4-8 Units total) into the skin 3 (three) times daily with meals. 07/05/2019: Using BID  . Insulin Pen Needle 32G X 4 MM MISC Use to inject insulin three times daily   . lipase/protease/amylase ([name]) 36000 UNITS CPEP capsule Take 36,000-72,000 Units by mouth See admin instructions. Take 3 capsules by mouth three times daily with meals and take 2 capsule by mouth daily  with snacks   . magnesium oxide (MAG-OX) 400 (241.3 Mg) MG tablet Take 1 tablet by mouth once daily   . metFORMIN (GLUCOPHAGE-XR) 500 MG 24 hr tablet Take 2 tablets (1,000 mg total) by mouth 2 (two) times daily.   . metoprolol tartrate (LOPRESSOR) 25 MG tablet Take 25 mg by mouth 2 (two) times daily.   Letta Pate VERIO test strip USE 1 STRIP TO CHECK GLUCOSE TWICE DAILY (Patient taking differently: 1 each by Other route 2 (two) times daily. )   . pantoprazole (PROTONIX) 40 MG tablet TAKE 1 TABLET BY MOUTH TWICE DAILY BEFORE MEAL(S)   . simvastatin (ZOCOR) 10 MG tablet TAKE 1 TABLET BY MOUTH AT BEDTIME   . triamcinolone cream (KENALOG) 0.1 % APPLY  CREAM TOPICALLY TWICE DAILY    No facility-administered encounter medications on file as of 07/08/2019.     Objective:   Goals Addressed            This Visit's Progress     Patient Stated   . "I can't afford my medications" (pt-stated)       CARE PLAN ENTRY (see longtitudinal plan of care for additional care plan information)   Current Barriers:  . Diabetes: controlled (appropriate goal <7.5%); most recent A1c 7.3% o Calls today regarding [name] PAP reorder . Current antihyperglycemic regimen: Basaglar 28 units daily, [name] 3-8 units PRN, metformin 1000 mg BID o GLP1 not a option d/t significant IBS. Renal function low for SGLT2 to provide glycemic benefit . Cardiovascular risk reduction: new onset Afib o Current  hypertensive regimen: amlodipine 10 mg QPM, metoprolol tartrate 25 mg BID; reports BP continues to be well controlled o Current hyperlipidemia regimen: simvastatin 10 mg daily; LDL at goal <70 on last check . Atrial Fibrillation: Eliquis 5 mg BID; reports significant concern regarding her out of pocket spend request . Pancreatic insufficiency: [name] 3 capsules w/ meals and 2 with snacks; waiting on a new increased dose prescription from [facility] [place]):  Marland Kitchen Over the next 90 days, patient with work with  PharmD and primary care provider to address optimized glycemic management  Interventions: . Contacted [name]. They did not realize that order was for an increased dose, so held the order until 07/05/19. I reiterated the increased dose and that the patient is running low; they are placing expedited order today. Tracking information should be available tomorrow. Should arrive in 5-7 business days. Confirmed that this is for the increased dose.   Patient Self Care Activities:  . Patient will check blood glucose QID, document, and provide at future appointments . Patient will take medications as prescribed . Patient will report any questions or concerns to provider   Please see past updates related to this goal by clicking on the "Past Updates" button in the selected goal          Plan:  - Will outreach as previously scheduled  Catie Darnelle Maffucci, PharmD, [place], [place] Pharmacist [facility] [phone]

## 2019-07-08 NOTE — Patient Instructions (Signed)
Visit Information  Goals Addressed            This Visit's Progress     Patient Stated   . "I can't afford my medications" (pt-stated)       CARE PLAN ENTRY (see longtitudinal plan of care for additional care plan information)   Current Barriers:  . Diabetes: controlled (appropriate goal <7.5%); most recent A1c 7.3% o Calls today regarding [name] PAP reorder . Current antihyperglycemic regimen: Basaglar 28 units daily, [name] 3-8 units PRN, metformin 1000 mg BID o GLP1 not a option d/t significant IBS. Renal function low for SGLT2 to provide glycemic benefit . Cardiovascular risk reduction: new onset Afib o Current hypertensive regimen: amlodipine 10 mg QPM, metoprolol tartrate 25 mg BID; reports BP continues to be well controlled o Current hyperlipidemia regimen: simvastatin 10 mg daily; LDL at goal <70 on last check . Atrial Fibrillation: Eliquis 5 mg BID; reports significant concern regarding her out of pocket spend request . Pancreatic insufficiency: [name] 3 capsules w/ meals and 2 with snacks; waiting on a new increased dose prescription from [facility]   Pharmacist Clinical Goal(s):  Marland Kitchen Over the next 90 days, patient with work with PharmD and primary care provider to address optimized glycemic management  Interventions: . Contacted [name]. They did not realize that order was for an increased dose, so held the order until 07/05/19. I reiterated the increased dose and that the patient is running low; they are placing expedited order today. Tracking information should be available tomorrow. Should arrive in 5-7 business days. Confirmed that this is for the increased dose.   Patient Self Care Activities:  . Patient will check blood glucose QID, document, and provide at future appointments . Patient will take medications as prescribed . Patient will report any questions or concerns to provider   Please see past updates related to this goal by clicking on the "Past Updates" button in  the selected goal         Patient verbalizes understanding of instructions provided today.   Plan:  - Will outreach as previously scheduled  Catie Feliz Beam, PharmD, [place], CPP Clinical Pharmacist [facility] [employer] [phone]

## 2019-07-09 ENCOUNTER — Encounter: Payer: Self-pay | Admitting: *Deleted

## 2019-07-09 ENCOUNTER — Other Ambulatory Visit: Payer: Self-pay | Admitting: *Deleted

## 2019-07-09 NOTE — Progress Notes (Signed)
Reviewed information.  Agree with plan.    Dr Juanice Warburton 

## 2019-07-09 NOTE — Patient Outreach (Signed)
ADDENDUM: Counseled pt on maximum APAP daily dosage not to exceed 3000 mg. Suggested she can try OTC lidocaine patch on her back BUT not in conjunction with the heating pad. She can use one and then use the other, just not in combination. Mrs. Napoleon said she understood.  Zara Council. Burgess Estelle, MSN, [facility] Gerontological Nurse Practitioner [facility] Care Management [phone]

## 2019-07-09 NOTE — Patient Outreach (Signed)
Telephone outreach and case closure for nursing care management.  Beth Tyler is doing fair. She reports her back went out last week. She has been sleeping in her recliner as this is more comfortable than her bed. She has been taking APAP 1000 mg every 4 hours while she is awake. She has been using a heating pad also. She does report her back is starting to be less painful.  [facility] Pharmacist and assistant have been assisting her with medications. She voices great appreciation for this.  She has received her first COVID19 vaccine. She did have some side effects: fever and HA.  She denies any further episodes of AFIB.  We agreed that pt is well attended by her home medical team and I will close my aspect of the care management service.  Will advise, Dr. Lorin Picket, Vanice Sarah, PharmD and Noreene Larsson Simcox that I am closing my case.  Zara Council. Burgess Estelle, MSN, [facility] Gerontological Nurse Practitioner [facility] Care Management [phone]

## 2019-07-11 ENCOUNTER — Encounter: Payer: Self-pay | Admitting: Internal Medicine

## 2019-07-11 NOTE — Assessment & Plan Note (Signed)
Sugars as otlined.  No low sugars since she adjusted her insuline dosing in the evening.  Low carb diet and exercise.  Follow met b and a1c.

## 2019-07-11 NOTE — Assessment & Plan Note (Signed)
Had previous xray that revealed some arthritis changes.  Pain as outlined.  Taking tylenol. Discussed dosing of tylenol.  Add low dose muscle relaxer in the evening.  Follow.

## 2019-07-11 NOTE — Assessment & Plan Note (Signed)
Felt had recent episode.  Has been evaluated by cardiology.  Continue eliquis.  No increased heart rate currently.  Overall feels heart stable.  Follow.

## 2019-07-11 NOTE — Assessment & Plan Note (Signed)
Blood pressure as outlined.  Overall doing better.  On metoprolol and amlodipine.  Follow pressures.  Follow metabolic panel.

## 2019-07-11 NOTE — Assessment & Plan Note (Signed)
Avoid antiinflammatories.  Stay hydrated.  Follow metabolic panel.   

## 2019-07-19 ENCOUNTER — Telehealth: Payer: Self-pay

## 2019-07-19 NOTE — Telephone Encounter (Signed)
07/19/19 Spoke with patient, she has received the [employer] has signed it and is getting copies of the requested paperwork to send with it.  Patient does not have any other needs at this time.  Closing referral. Olean Ree [phone]

## 2019-07-22 ENCOUNTER — Other Ambulatory Visit: Payer: Self-pay | Admitting: Pharmacy Technician

## 2019-07-22 NOTE — Patient Outreach (Signed)
[facility]) Care Management  07/22/2019  Beth Tyler Apr 27, 1945 [id number]   Unsuccessful call placed to patient regarding patient assistance application(s) for Eliquis with BMS , HIPAA compliant voicemail left.   Was calling to inquire if patient received the [employer] patient assistance application that was mailed to her on 07/06/2019.  Follow up:  Will follow up with patient in 5-7 business days if call is not returned.  [name] P. Everitt Wenner, CPhT [facility] [employer]  [phone]

## 2019-07-27 ENCOUNTER — Other Ambulatory Visit: Payer: Self-pay

## 2019-07-27 ENCOUNTER — Other Ambulatory Visit (INDEPENDENT_AMBULATORY_CARE_PROVIDER_SITE_OTHER): Payer: Medicare HMO

## 2019-07-27 DIAGNOSIS — E1140 Type 2 diabetes mellitus with diabetic neuropathy, unspecified: Secondary | ICD-10-CM

## 2019-07-27 DIAGNOSIS — E785 Hyperlipidemia, unspecified: Secondary | ICD-10-CM

## 2019-07-27 DIAGNOSIS — E876 Hypokalemia: Secondary | ICD-10-CM | POA: Diagnosis not present

## 2019-07-27 DIAGNOSIS — Z794 Long term (current) use of insulin: Secondary | ICD-10-CM

## 2019-07-27 LAB — HEPATIC FUNCTION PANEL
ALT: 12 U/L (ref 0–35)
AST: 15 U/L (ref 0–37)
Albumin: 4.5 g/dL (ref 3.5–5.2)
Alkaline Phosphatase: 73 U/L (ref 39–117)
Bilirubin, Direct: 0.1 mg/dL (ref 0.0–0.3)
Total Bilirubin: 0.5 mg/dL (ref 0.2–1.2)
Total Protein: 7.1 g/dL (ref 6.0–8.3)

## 2019-07-27 LAB — MICROALBUMIN / CREATININE URINE RATIO
Creatinine,U: 61.2 mg/dL
Microalb Creat Ratio: 14.7 mg/g (ref 0.0–30.0)
Microalb, Ur: 9 mg/dL — ABNORMAL HIGH (ref 0.0–1.9)

## 2019-07-27 LAB — HEMOGLOBIN A1C: Hgb A1c MFr Bld: 6.9 % — ABNORMAL HIGH (ref 4.6–6.5)

## 2019-07-27 LAB — LIPID PANEL
Cholesterol: 124 mg/dL (ref 0–200)
HDL: 63.8 mg/dL (ref >39.00)
LDL Cholesterol: 46 mg/dL (ref 0–99)
NonHDL: 59.92
Total CHOL/HDL Ratio: 2
Triglycerides: 69 mg/dL (ref 0.0–149.0)
VLDL: 13.8 mg/dL (ref 0.0–40.0)

## 2019-07-27 LAB — BASIC METABOLIC PANEL
BUN: 15 mg/dL (ref 6–23)
CO2: 32 mEq/L (ref 19–32)
Calcium: 9.8 mg/dL (ref 8.4–10.5)
Chloride: 102 mEq/L (ref 96–112)
Creatinine, Ser: 0.83 mg/dL (ref 0.40–1.20)
GFR: 67.05 mL/min (ref >60.00)
Glucose, Bld: 116 mg/dL — ABNORMAL HIGH (ref 70–99)
Potassium: 4.4 mEq/L (ref 3.5–5.1)
Sodium: 141 mEq/L (ref 135–145)

## 2019-07-30 ENCOUNTER — Encounter: Payer: Self-pay | Admitting: Internal Medicine

## 2019-07-30 ENCOUNTER — Telehealth (INDEPENDENT_AMBULATORY_CARE_PROVIDER_SITE_OTHER): Payer: Medicare HMO | Admitting: Internal Medicine

## 2019-07-30 ENCOUNTER — Other Ambulatory Visit: Payer: Self-pay | Admitting: Pharmacy Technician

## 2019-07-30 DIAGNOSIS — I480 Paroxysmal atrial fibrillation: Secondary | ICD-10-CM

## 2019-07-30 DIAGNOSIS — R198 Other specified symptoms and signs involving the digestive system and abdomen: Secondary | ICD-10-CM

## 2019-07-30 DIAGNOSIS — N183 Chronic kidney disease, stage 3 unspecified: Secondary | ICD-10-CM | POA: Diagnosis not present

## 2019-07-30 DIAGNOSIS — E1122 Type 2 diabetes mellitus with diabetic chronic kidney disease: Secondary | ICD-10-CM | POA: Diagnosis not present

## 2019-07-30 DIAGNOSIS — I10 Essential (primary) hypertension: Secondary | ICD-10-CM

## 2019-07-30 DIAGNOSIS — E7800 Pure hypercholesterolemia, unspecified: Secondary | ICD-10-CM | POA: Diagnosis not present

## 2019-07-30 DIAGNOSIS — Z794 Long term (current) use of insulin: Secondary | ICD-10-CM

## 2019-07-30 NOTE — Progress Notes (Deleted)
Patient ID: Beth Tyler, female   DOB: 07-30-1945, 74 y.o.   MRN: [id number]   Subjective:    Patient ID: Beth Tyler, female    DOB: 10/03/1945, 74 y.o.   MRN: [id number]  HPI This visit occurred during the SARS-CoV-2 public health emergency.  Safety protocols were in place, including screening questions prior to the visit, additional usage of staff PPE, and extensive cleaning of exam room while observing appropriate contact time as indicated for disinfecting solutions.  Patient here for a scheduled follow up.    Past Medical History:  Diagnosis Date  . Anemia   . Arthritis   . B12 deficiency   . Carpal tunnel syndrome, bilateral   . Diabetes mellitus (HCC) 02/17/2012  . Diabetes mellitus, type II (HCC)   . Dyspnea   . Gastritis   . GERD (gastroesophageal reflux disease)   . Hypercholesterolemia   . Hypertension   . IBS (irritable bowel syndrome)   . Neuropathy    legs  . Pernicious anemia   . Vertigo    no episodes fo 9 or 10 yrs   Past Surgical History:  Procedure Laterality Date  . ANTERIOR VITRECTOMY Right 12/01/2017   Procedure: ANTERIOR VITRECTOMY;  Surgeon: Nevada Crane, MD;  Location: [facility];  Service: Ophthalmology;  Laterality: Right;  . BREAST BIOPSY Right 1980   neg  . BREAST SURGERY  1980   benign cyst removal  . CATARACT EXTRACTION W/PHACO Left 10/13/2017   Procedure: CATARACT EXTRACTION PHACO AND INTRAOCULAR LENS PLACEMENT (IOC)  LEFT  DIABETES;  Surgeon: Nevada Crane, MD;  Location: [facility];  Service: Ophthalmology;  Laterality: Left;  Diabetic - insulin and oral meds  . CATARACT EXTRACTION W/PHACO Right 12/01/2017   Procedure: CATARACT EXTRACTION PHACO AND INTRAOCULAR LENS PLACEMENT (IOC)  RIGHT DIABETIC;  Surgeon: Nevada Crane, MD;  Location: [facility];  Service: Ophthalmology;  Laterality: Right;  Diabetic - insulin and oral  . CHOLECYSTECTOMY  1983   Family History  Problem Relation Age of Onset   . Diabetes Father   . Kidney disease Father   . Heart disease Father   . Alcohol abuse Mother   . Dementia Mother   . Diabetes Mother   . Diabetes Other   . Heart disease Son 75       CABG  . Hemophilia Brother   . Uterine cancer Sister   . Diabetes Sister   . COPD Sister   . Breast cancer Maternal Aunt 51  . Breast cancer Daughter 66  . Stroke Daughter    Social History   Socioeconomic History  . Marital status: Widowed    Spouse name: Not on file  . Number of children: 4  . Years of education: Not on file  . Highest education level: Not on file  Occupational History  . Not on file  Tobacco Use  . Smoking status: Never Smoker  . Smokeless tobacco: Never Used  Substance and Sexual Activity  . Alcohol use: No    Alcohol/week: 0.0 standard drinks  . Drug use: No  . Sexual activity: Not Currently  Other Topics Concern  . Not on file  Social History Narrative   Widowed. Has four children. Manager of an apartment building. No tobacco, alcohol or other drug use   Social Determinants of Health   Financial Resource Strain: High Risk  . Difficulty of Paying Living Expenses: Hard  Food Insecurity:   . Worried About [employer] of  Food in the Last Year:   . Ran Out of Food in the Last Year:   Transportation Needs:   . Freight forwarder (Medical):   Marland Kitchen Lack of Transportation (Non-Medical):   Physical Activity:   . Days of Exercise per Week:   . Minutes of Exercise per Session:   Stress: No Stress Concern Present  . Feeling of Stress : Not at all  Social Connections: Slightly Isolated  . Frequency of Communication with Friends and Family: More than three times a week  . Frequency of Social Gatherings with Friends and Family: More than three times a week  . Attends Religious Services: 1 to 4 times per year  . Active Member of Clubs or Organizations: Yes  . Attends Banker Meetings: 1 to 4 times per year  . Marital Status: Widowed    Outpatient  Encounter Medications as of 07/30/2019  Medication Sig  . acetaminophen (TYLENOL) 500 MG tablet Take 500-1,000 mg by mouth every 6 (six) hours as needed for mild pain or moderate pain.   Marland Kitchen amLODipine (NORVASC) 10 MG tablet Take 1 tablet by mouth once daily  . apixaban (ELIQUIS) 5 MG TABS tablet Take 1 tablet (5 mg total) by mouth 2 (two) times daily.  . fluticasone (CUTIVATE) 0.05 % cream Apply 1 application topically as needed. Use as directed.  . Insulin Glargine (BASAGLAR KWIKPEN) 100 UNIT/ML SOPN Inject 0.28 mLs (28 Units total) into the skin every morning.  . insulin lispro ([name]) 100 UNIT/ML injection Inject 0.04-0.08 mLs (4-8 Units total) into the skin 3 (three) times daily with meals.  . Insulin Pen Needle 32G X 4 MM MISC Use to inject insulin three times daily  . lipase/protease/amylase ([name]) 36000 UNITS CPEP capsule Take 36,000-72,000 Units by mouth See admin instructions. Take 3 capsules by mouth three times daily with meals and take 2 capsule by mouth daily with snacks  . magnesium oxide (MAG-OX) 400 (241.3 Mg) MG tablet Take 1 tablet by mouth once daily  . metFORMIN (GLUCOPHAGE-XR) 500 MG 24 hr tablet Take 2 tablets (1,000 mg total) by mouth 2 (two) times daily.  . metoprolol tartrate (LOPRESSOR) 25 MG tablet Take 25 mg by mouth 2 (two) times daily.  Letta Pate VERIO test strip USE 1 STRIP TO CHECK GLUCOSE TWICE DAILY (Patient taking differently: 1 each by Other route 2 (two) times daily. )  . pantoprazole (PROTONIX) 40 MG tablet TAKE 1 TABLET BY MOUTH TWICE DAILY BEFORE MEAL(S)  . simvastatin (ZOCOR) 10 MG tablet TAKE 1 TABLET BY MOUTH AT BEDTIME  . tizanidine (ZANAFLEX) 2 MG capsule Take 1 capsule (2 mg total) by mouth daily.  Marland Kitchen triamcinolone cream (KENALOG) 0.1 % APPLY  CREAM TOPICALLY TWICE DAILY   No facility-administered encounter medications on file as of 07/30/2019.    Review of Systems     Objective:    Physical Exam  BP 123/60   Ht 5\' 2"  (1.575 m)   Wt 198 lb  (89.8 kg)   BMI 36.21 kg/m  Wt Readings from Last 3 Encounters:  07/30/19 198 lb (89.8 kg)  07/08/19 199 lb (90.3 kg)  05/05/19 209 lb (94.8 kg)     Lab Results  Component Value Date   WBC 9.9 04/28/2019   HGB 11.1 (L) 04/28/2019   HCT 31.3 (L) 04/28/2019   PLT 281 04/28/2019   GLUCOSE 116 (H) 07/27/2019   CHOL 124 07/27/2019   TRIG 69.0 07/27/2019   HDL 63.80 07/27/2019   LDLDIRECT 74.4  02/21/2012   LDLCALC 46 07/27/2019   ALT 12 07/27/2019   AST 15 07/27/2019   NA 141 07/27/2019   K 4.4 07/27/2019   CL 102 07/27/2019   CREATININE 0.83 07/27/2019   BUN 15 07/27/2019   CO2 32 07/27/2019   TSH 1.514 04/27/2019   INR 1.0 03/18/2019   HGBA1C 6.9 (H) 07/27/2019   MICROALBUR 9.0 (H) 07/27/2019    MM 3D SCREEN BREAST BILATERAL  Result Date: 06/18/2019 CLINICAL DATA:  Screening. EXAM: DIGITAL SCREENING BILATERAL MAMMOGRAM WITH TOMO AND CAD COMPARISON:  Previous exam(s). ACR Breast Density Category b: There are scattered areas of fibroglandular density. FINDINGS: There are no findings suspicious for malignancy. Images were processed with CAD. IMPRESSION: No mammographic evidence of malignancy. A result letter of this screening mammogram will be mailed directly to the patient. RECOMMENDATION: Screening mammogram in one year. (Code:SM-B-01Y) BI-RADS CATEGORY  1: Negative. Electronically Signed   By: Nolon Nations M.D.   On: 06/18/2019 12:47       Assessment & Plan:   Problem List Items Addressed This Visit    None       Einar Pheasant, MD

## 2019-07-30 NOTE — Patient Outreach (Signed)
[facility]) Care Management  07/30/2019  Beth Tyler 1945-12-27 [id number]   Successful call placed to patient regarding patient assistance application(s) for Eliquis with BMS , HIPAA identifiers verified.   Patient informed she has received the application and has it filled out. She informs she is awaiting on her EOB from [place] concerning her OOP spend. She informs she would mail it in when she has received that information.  Follow up:  Will route note to embedded [facility] pharmacist Catie Feliz Beam for case closure if document(s) have not been received in the next 15 business days.  Beth Tyler P. Ronette Hank, CPhT [facility] [employer]  [phone]

## 2019-08-07 ENCOUNTER — Encounter: Payer: Self-pay | Admitting: Internal Medicine

## 2019-08-07 DIAGNOSIS — E7800 Pure hypercholesterolemia, unspecified: Secondary | ICD-10-CM | POA: Insufficient documentation

## 2019-08-07 DIAGNOSIS — R198 Other specified symptoms and signs involving the digestive system and abdomen: Secondary | ICD-10-CM | POA: Insufficient documentation

## 2019-08-07 NOTE — Progress Notes (Signed)
Patient ID: Beth Tyler, female   DOB: Aug 28, 1945, 74 y.o.   MRN: [id number]   Virtual Visit via video Note  This visit type was conducted due to national recommendations for restrictions regarding the COVID-19 pandemic (e.g. social distancing).  This format is felt to be most appropriate for this patient at this time.  All issues noted in this document were discussed and addressed.  No physical exam was performed (except for noted visual exam findings with Video Visits).   I connected with [name] by a video enabled telemedicine application and verified that I am speaking with the correct person using two identifiers. Location patient: home Location provider: work Persons participating in the virtual visit: patient, provider  The limitations, risks, security and privacy concerns of performing an evaluation and management service by video and the availability of in person appointments have been discussed. The patient expressed understanding and agreed to proceed.   Reason for visit: scheduled follow up.    HPI: She reports she is doing relatively well.  Bowels are better  She is taking creon.  No abdominal pain.  Eating.  Walking better.  Energy improving.  No chest pain.  Did have vaccine 07/07/19.  Had episode of increased heart rate.  EMTs called.  - afib.  Will noticed some increased heart rate at times when gets up to do things.  Noticed last night - blood pressure elevated - 201/90.  Pulse rate 111.  Lasted approximately 15 minutes.  Took metoprolol.  Feeling better now.  Has f/u with cardiology soon.  Overall feels she is doing better. Taking eliquis.  Has noticed some right arm - muscle aching and joint pain.  No known injury.  Wants to monitor.      ROS: See pertinent positives and negatives per HPI.  Past Medical History:  Diagnosis Date  . Anemia   . Arthritis   . B12 deficiency   . Carpal tunnel syndrome, bilateral   . Diabetes mellitus ([place]) 02/17/2012  . Diabetes  mellitus, type II ([place])   . Dyspnea   . Gastritis   . GERD (gastroesophageal reflux disease)   . Hypercholesterolemia   . Hypertension   . IBS (irritable bowel syndrome)   . Neuropathy    legs  . Pernicious anemia   . Vertigo    no episodes fo 9 or 10 yrs    Past Surgical History:  Procedure Laterality Date  . ANTERIOR VITRECTOMY Right 12/01/2017   Procedure: ANTERIOR VITRECTOMY;  Surgeon: Eulogio Bear, MD;  Location: [place];  Service: Ophthalmology;  Laterality: Right;  . BREAST BIOPSY Right 1980   neg  . BREAST SURGERY  1980   benign cyst removal  . CATARACT EXTRACTION W/PHACO Left 10/13/2017   Procedure: CATARACT EXTRACTION PHACO AND INTRAOCULAR LENS PLACEMENT ([place])  LEFT  DIABETES;  Surgeon: Eulogio Bear, MD;  Location: [place];  Service: Ophthalmology;  Laterality: Left;  Diabetic - insulin and oral meds  . CATARACT EXTRACTION W/PHACO Right 12/01/2017   Procedure: CATARACT EXTRACTION PHACO AND INTRAOCULAR LENS PLACEMENT ([place])  RIGHT DIABETIC;  Surgeon: Eulogio Bear, MD;  Location: [place];  Service: Ophthalmology;  Laterality: Right;  Diabetic - insulin and oral  . CHOLECYSTECTOMY  1983    Family History  Problem Relation Age of Onset  . Diabetes Father   . Kidney disease Father   . Heart disease Father   . Alcohol abuse Mother   . Dementia Mother   .  Diabetes Mother   . Diabetes Other   . Heart disease Son 6       CABG  . Hemophilia Brother   . Uterine cancer Sister   . Diabetes Sister   . COPD Sister   . Breast cancer Maternal Aunt 51  . Breast cancer Daughter 52  . Stroke Daughter     SOCIAL HX: reviewed.    Current Outpatient Medications:  .  acetaminophen (TYLENOL) 500 MG tablet, Take 500-1,000 mg by mouth every 6 (six) hours as needed for mild pain or moderate pain. , Disp: , Rfl:  .  amLODipine (NORVASC) 10 MG tablet, Take 1 tablet by mouth once daily, Disp: 90 tablet, Rfl: 0 .  apixaban (ELIQUIS) 5  MG TABS tablet, Take 1 tablet (5 mg total) by mouth 2 (two) times daily., Disp: 60 tablet, Rfl: 1 .  fluticasone (CUTIVATE) 0.05 % cream, Apply 1 application topically as needed. Use as directed., Disp: , Rfl:  .  Insulin Glargine (BASAGLAR KWIKPEN) 100 UNIT/ML SOPN, Inject 0.28 mLs (28 Units total) into the skin every morning., Disp: , Rfl:  .  insulin lispro ([name]) 100 UNIT/ML injection, Inject 0.04-0.08 mLs (4-8 Units total) into the skin 3 (three) times daily with meals., Disp: , Rfl:  .  Insulin Pen Needle 32G X 4 MM MISC, Use to inject insulin three times daily, Disp: 300 each, Rfl: 3 .  lipase/protease/amylase ([name]) 36000 UNITS CPEP capsule, Take 90,300-92,330 Units by mouth See admin instructions. Take 3 capsules by mouth three times daily with meals and take 2 capsule by mouth daily with snacks, Disp: , Rfl:  .  magnesium oxide (MAG-OX) 400 (241.3 Mg) MG tablet, Take 1 tablet by mouth once daily, Disp: 90 tablet, Rfl: 0 .  metFORMIN (GLUCOPHAGE-XR) 500 MG 24 hr tablet, Take 2 tablets (1,000 mg total) by mouth 2 (two) times daily., Disp: 360 tablet, Rfl: 3 .  metoprolol tartrate (LOPRESSOR) 25 MG tablet, Take 25 mg by mouth 2 (two) times daily., Disp: , Rfl:  .  ONETOUCH VERIO test strip, USE 1 STRIP TO CHECK GLUCOSE TWICE DAILY (Patient taking differently: 1 each by Other route 2 (two) times daily. ), Disp: 200 each, Rfl: 0 .  pantoprazole (PROTONIX) 40 MG tablet, TAKE 1 TABLET BY MOUTH TWICE DAILY BEFORE MEAL(S), Disp: 180 tablet, Rfl: 0 .  simvastatin (ZOCOR) 10 MG tablet, TAKE 1 TABLET BY MOUTH AT BEDTIME, Disp: 90 tablet, Rfl: 0 .  tizanidine (ZANAFLEX) 2 MG capsule, Take 1 capsule (2 mg total) by mouth daily., Disp: 15 capsule, Rfl: 0 .  triamcinolone cream (KENALOG) 0.1 %, APPLY  CREAM TOPICALLY TWICE DAILY, Disp: 15 g, Rfl: 0  EXAM:  GENERAL: alert, oriented, appears well and in no acute distress  HEENT: atraumatic, conjunttiva clear, no obvious abnormalities on inspection of  external nose and ears  NECK: normal movements of the head and neck  LUNGS: on inspection no signs of respiratory distress, breathing rate appears normal, no obvious gross SOB, gasping or wheezing  CV: no obvious cyanosis  PSYCH/NEURO: pleasant and cooperative, no obvious depression or anxiety, speech and thought processing grossly intact  ASSESSMENT AND PLAN:  Discussed the following assessment and plan:  Type 2 diabetes mellitus with diabetic chronic kidney disease (HCC) Low carb diet and exercise.  States sugars are doing ok.  Follow met b and a1c.   Stage 3 chronic kidney disease Stay hydrated.  Avoid antiinflammatories.  Follow met b.    Paroxysmal A-fib (HCC) Intermittent  episodes of afib as outlined.  Discussed taking metoprolol prn.  Feeling better now.  Continue eliquis.  Continue scheduled metoprolol.  Denies need for evaluation now.  Keep f/u with cardiology.  Any acute symptoms, needs to be evaluated.    Benign essential hypertension Blood pressure doing well.  Continue metoprolol and amlodipine.  Follow pressures.  Follow metabolic panel.   Hypercholesterolemia On simvastatin.  Low  Cholesterol diet and exercise.  Follow lipid panel and liver function tests.    Change in bowel movement On creon.  Bowels better.  No abdominal pain now.  Follow.     Orders Placed This Encounter  Procedures  . CBC with Differential/Platelet    Standing Status:   Future    Standing Expiration Date:   08/06/2020  . Hemoglobin A1c    Standing Status:   Future    Standing Expiration Date:   08/06/2020  . Hepatic function panel    Standing Status:   Future    Standing Expiration Date:   08/06/2020  . Lipid panel    Standing Status:   Future    Standing Expiration Date:   08/06/2020  . Basic metabolic panel    Standing Status:   Future    Standing Expiration Date:   08/06/2020     I discussed the assessment and treatment plan with the patient. The patient was provided an opportunity to  ask questions and all were answered. The patient agreed with the plan and demonstrated an understanding of the instructions.   The patient was advised to call back or seek an in-person evaluation if the symptoms worsen or if the condition fails to improve as anticipated.    Einar Pheasant, MD

## 2019-08-07 NOTE — Assessment & Plan Note (Signed)
On creon.  Bowels better.  No abdominal pain now.  Follow.

## 2019-08-07 NOTE — Assessment & Plan Note (Signed)
Low carb diet and exercise.  States sugars are doing ok.  Follow met b and a1c.

## 2019-08-07 NOTE — Assessment & Plan Note (Signed)
Blood pressure doing well.  Continue metoprolol and amlodipine.  Follow pressures.  Follow metabolic panel.  

## 2019-08-07 NOTE — Assessment & Plan Note (Signed)
On simvastatin.  Low  Cholesterol diet and exercise.  Follow lipid panel and liver function tests.

## 2019-08-07 NOTE — Assessment & Plan Note (Signed)
Stay hydrated.  Avoid antiinflammatories.  Follow met b.  

## 2019-08-07 NOTE — Assessment & Plan Note (Signed)
Intermittent episodes of afib as outlined.  Discussed taking metoprolol prn.  Feeling better now.  Continue eliquis.  Continue scheduled metoprolol.  Denies need for evaluation now.  Keep f/u with cardiology.  Any acute symptoms, needs to be evaluated.

## 2019-08-14 ENCOUNTER — Ambulatory Visit: Payer: Medicare HMO

## 2019-08-18 DIAGNOSIS — I10 Essential (primary) hypertension: Secondary | ICD-10-CM | POA: Diagnosis not present

## 2019-08-18 DIAGNOSIS — I480 Paroxysmal atrial fibrillation: Secondary | ICD-10-CM | POA: Diagnosis not present

## 2019-08-18 DIAGNOSIS — R600 Localized edema: Secondary | ICD-10-CM | POA: Diagnosis not present

## 2019-08-18 DIAGNOSIS — R0602 Shortness of breath: Secondary | ICD-10-CM | POA: Diagnosis not present

## 2019-08-18 DIAGNOSIS — I890 Lymphedema, not elsewhere classified: Secondary | ICD-10-CM | POA: Diagnosis not present

## 2019-08-18 DIAGNOSIS — E1169 Type 2 diabetes mellitus with other specified complication: Secondary | ICD-10-CM | POA: Diagnosis not present

## 2019-08-19 ENCOUNTER — Ambulatory Visit (INDEPENDENT_AMBULATORY_CARE_PROVIDER_SITE_OTHER): Payer: Medicare HMO | Admitting: Pharmacist

## 2019-08-19 DIAGNOSIS — I480 Paroxysmal atrial fibrillation: Secondary | ICD-10-CM | POA: Diagnosis not present

## 2019-08-19 DIAGNOSIS — Z794 Long term (current) use of insulin: Secondary | ICD-10-CM | POA: Diagnosis not present

## 2019-08-19 DIAGNOSIS — N183 Chronic kidney disease, stage 3 unspecified: Secondary | ICD-10-CM | POA: Diagnosis not present

## 2019-08-19 DIAGNOSIS — E1122 Type 2 diabetes mellitus with diabetic chronic kidney disease: Secondary | ICD-10-CM

## 2019-08-19 DIAGNOSIS — E7800 Pure hypercholesterolemia, unspecified: Secondary | ICD-10-CM

## 2019-08-19 NOTE — Chronic Care Management (AMB) (Signed)
Chronic Care Management   Follow Up Note   08/19/2019 Name: Beth Tyler MRN: [id number] DOB: Feb 13, 1946  Referred by: Einar Pheasant, MD Reason for referral : Chronic Care Management (Medication Management)   [name] is a 74 y.o. year old female who is a primary care patient of Einar Pheasant, MD. The CCM team was consulted for assistance with chronic disease management and care coordination needs.    Contacted patient for medication management review.   Review of patient status, including review of consultants reports, relevant laboratory and other test results, and collaboration with appropriate care team members and the patient's provider was performed as part of comprehensive patient evaluation and provision of chronic care management services.    SDOH (Social Determinants of Health) assessments performed: Yes See Care Plan activities for detailed interventions related to Beth Tyler)     Outpatient Encounter Medications as of 08/19/2019  Medication Sig Note  . amLODipine (NORVASC) 10 MG tablet Take 1 tablet by mouth once daily   . apixaban (ELIQUIS) 5 MG TABS tablet Take 1 tablet (5 mg total) by mouth 2 (two) times daily.   . Insulin Glargine (BASAGLAR KWIKPEN) 100 UNIT/ML SOPN Inject 0.28 mLs (28 Units total) into the skin every morning.   . insulin lispro ([name]) 100 UNIT/ML injection Inject 0.04-0.08 mLs (4-8 Units total) into the skin 3 (three) times daily with meals. 07/05/2019: Using BID  . Insulin Pen Needle 32G X 4 MM MISC Use to inject insulin three times daily   . metFORMIN (GLUCOPHAGE-XR) 500 MG 24 hr tablet Take 2 tablets (1,000 mg total) by mouth 2 (two) times daily.   . metoprolol tartrate (LOPRESSOR) 25 MG tablet Take 25 mg by mouth 2 (two) times daily.   Glory Rosebush VERIO test strip USE 1 STRIP TO CHECK GLUCOSE TWICE DAILY (Patient taking differently: 1 each by Other route 2 (two) times daily. )   . pantoprazole (PROTONIX) 40 MG tablet TAKE 1 TABLET BY  MOUTH TWICE DAILY BEFORE MEAL(S)   . simvastatin (ZOCOR) 10 MG tablet TAKE 1 TABLET BY MOUTH AT BEDTIME   . acetaminophen (TYLENOL) 500 MG tablet Take 500-1,000 mg by mouth every 6 (six) hours as needed for mild pain or moderate pain.    . fluticasone (CUTIVATE) 0.05 % cream Apply 1 application topically as needed. Use as directed.   . lipase/protease/amylase ([name]) 36000 UNITS CPEP capsule Take 51,884-16,606 Units by mouth See admin instructions. Take 3 capsules by mouth three times daily with meals and take 2 capsule by mouth daily with snacks   . magnesium oxide (MAG-OX) 400 (241.3 Mg) MG tablet Take 1 tablet by mouth once daily   . tizanidine (ZANAFLEX) 2 MG capsule Take 1 capsule (2 mg total) by mouth daily.   Marland Kitchen triamcinolone cream (KENALOG) 0.1 % APPLY  CREAM TOPICALLY TWICE DAILY    No facility-administered encounter medications on file as of 08/19/2019.     Objective:   Goals Addressed            This Visit's Progress     Patient Stated   . "I can't afford my medications" (pt-stated)       CARE PLAN ENTRY (see longtitudinal plan of care for additional care plan information)   Current Barriers:  . Diabetes: CONTROLLED; most recent T0Z 6.0%, complicated by Afib, HTN, HLD o Looking forward to an outdoor birthday celebration next month with some of her family  . Current antihyperglycemic regimen: Basaglar 28 units daily, [name] 3-8 units  PRN, metformin 1000 mg BID o Avoiding GLP1 d/t GI disease (IBS); however, patient is doing much better now that she has pancreatic enzyme replacement therapy  . Current glucose readings:  o Reports that cost of FreeStyle [name] was going to be $135/90 days, which is going to be too expensive for her o Reports fastings remain 90-100s, 2 hour post prandial ~130s . Current exercise o Does leg exercises/chair exercises; also uses a leg massager o Walks her porch (58 ft) 6-8 times daily  . Cardiovascular risk reduction: new onset Afib o Current  hypertensive regimen: amlodipine 10 mg QPM, metoprolol tartrate 25 mg BID - BP well at goal <130/80s o Current hyperlipidemia regimen: simvastatin 10 mg daily; LDL at goal <70 on last check. Notes that for a bit, she switched to simvastatin QOD just to see if her cholesterol was as well controlled. She has since gone back to taking daily . Atrial Fibrillation: Eliquis 5 mg BID; still struggling to get out of pocket spend report from pharmacy. She has all of the paperwork completed, but waiting on her next EOB from [place] . Pancreatic insufficiency: [name] 3 capsules w/ meals and 2 with snacks; notes that she has had significant decrease in GI discomfort/pain since increasing [name] to 3 capsules with meals; f/u June 12 . COVID vaccine: notes that she has had her first injection, plans to get the second in the next few days  Pharmacist Clinical Goal(s):  Marland Kitchen Over the next 90 days, patient with work with PharmD and primary care provider to address optimized glycemic management  Interventions: . Comprehensive medication review performed, medication list updated in electronic medical record . Inter-disciplinary care team collaboration (see longitudinal plan of care) . Praised patient for obtaining goal A1c <7%. . Discussed SGLT2, like [place]. Given patient's CKD, this class would be beneficial in reducing risk of progression. She would also benefit from possibly being able to reduce insulin doses, possibly even eliminate mealtime insulin altogether. Patient would like to think on this, and discuss at our next phone call. She would qualify for assistance from the drug company.  . Reviewed goal BP. Praised patient for maintenance of goal . Reviewed goal lipids. Praised patient for adherence, reviewed importance of adherence and pleiotropic benefits of statins. Patient verbalized understanding.  . Encouraged to obtain medication print out from pharmacy or obtain EOB from [place], and mail back to [facility]Ph[facility] as soon as  possible to pursue assistance for Eliquis.  Patient Self Care Activities:  . Patient will check blood glucose QID, document, and provide at future appointments . Patient will take medications as prescribed . Patient will report any questions or concerns to provider   Please see past updates related to this goal by clicking on the "Past Updates" button in the selected goal          Plan:  - Scheduled f/u call in ~ 8 weeks  Catie Feliz Beam, PharmD, [place], CPP Clinical Pharmacist [facility] [employer] [phone]

## 2019-08-19 NOTE — Patient Instructions (Signed)
Visit Information  Goals Addressed            This Visit's Progress     Patient Stated   . "I can't afford my medications" (pt-stated)       CARE PLAN ENTRY (see longtitudinal plan of care for additional care plan information)   Current Barriers:  . Diabetes: CONTROLLED; most recent N8G 9.5%, complicated by Afib, HTN, HLD o Looking forward to an outdoor birthday celebration next month with some of her family  . Current antihyperglycemic regimen: Basaglar 28 units daily, [name] 3-8 units PRN, metformin 1000 mg BID o Avoiding GLP1 d/t GI disease (IBS); however, patient is doing much better now that she has pancreatic enzyme replacement therapy  . Current glucose readings:  o Reports that cost of FreeStyle Elenor Legato was going to be $135/90 days, which is going to be too expensive for her o Reports fastings remain 90-100s, 2 hour post prandial ~130s . Current exercise o Does leg exercises/chair exercises; also uses a leg massager o Walks her porch (58 ft) 6-8 times daily  . Cardiovascular risk reduction: new onset Afib o Current hypertensive regimen: amlodipine 10 mg QPM, metoprolol tartrate 25 mg BID - BP well at goal <130/80s o Current hyperlipidemia regimen: simvastatin 10 mg daily; LDL at goal <70 on last check. Notes that for a bit, she switched to simvastatin QOD just to see if her cholesterol was as well controlled. She has since gone back to taking daily . Atrial Fibrillation: Eliquis 5 mg BID; still struggling to get out of pocket spend report from pharmacy. She has all of the paperwork completed, but waiting on her next EOB from [place] . Pancreatic insufficiency: [name] 3 capsules w/ meals and 2 with snacks; notes that she has had significant decrease in GI discomfort/pain since increasing [name] to 3 capsules with meals; f/u June 12 . COVID vaccine: notes that she has had her first injection, plans to get the second in the next few days  Pharmacist Clinical Goal(s):  Marland Kitchen Over the  next 90 days, patient with work with PharmD and primary care provider to address optimized glycemic management  Interventions: . Comprehensive medication review performed, medication list updated in electronic medical record . Inter-disciplinary care team collaboration (see longitudinal plan of care) . Praised patient for obtaining goal A1c <7%. . Discussed SGLT2, like [place]. Given patient's CKD, this class would be beneficial in reducing risk of progression. She would also benefit from possibly being able to reduce insulin doses, possibly even eliminate mealtime insulin altogether. Patient would like to think on this, and discuss at our next phone call. She would qualify for assistance from the drug company.  . Reviewed goal BP. Praised patient for maintenance of goal . Reviewed goal lipids. Praised patient for adherence, reviewed importance of adherence and pleiotropic benefits of statins. Patient verbalized understanding.  . Encouraged to obtain medication print out from pharmacy or obtain EOB from [place], and mail back to [facility]Ph[facility] as soon as possible to pursue assistance for Eliquis.  Patient Self Care Activities:  . Patient will check blood glucose QID, document, and provide at future appointments . Patient will take medications as prescribed . Patient will report any questions or concerns to provider   Please see past updates related to this goal by clicking on the "Past Updates" button in the selected goal         Patient verbalizes understanding of instructions provided today.   Plan:  - Scheduled f/u call in ~ 8  weeks  Catie Feliz Beam, PharmD, [place], CPP Clinical Pharmacist [facility] [employer] [phone]

## 2019-08-19 NOTE — Progress Notes (Signed)
Reviewed information.  Agree with plan.    Dr Channon Ambrosini 

## 2019-09-15 DIAGNOSIS — K8681 Exocrine pancreatic insufficiency: Secondary | ICD-10-CM | POA: Diagnosis not present

## 2019-09-15 DIAGNOSIS — K219 Gastro-esophageal reflux disease without esophagitis: Secondary | ICD-10-CM | POA: Diagnosis not present

## 2019-09-15 DIAGNOSIS — K580 Irritable bowel syndrome with diarrhea: Secondary | ICD-10-CM | POA: Diagnosis not present

## 2019-09-28 ENCOUNTER — Other Ambulatory Visit: Payer: Self-pay

## 2019-09-28 ENCOUNTER — Emergency Department
Admission: EM | Admit: 2019-09-28 | Discharge: 2019-09-28 | Disposition: A | Discharge status: Home / Self Care | Payer: Medicare HMO | Attending: Emergency Medicine | Admitting: Emergency Medicine

## 2019-09-28 ENCOUNTER — Encounter: Payer: Self-pay | Admitting: Emergency Medicine

## 2019-09-28 DIAGNOSIS — Z794 Long term (current) use of insulin: Secondary | ICD-10-CM | POA: Diagnosis not present

## 2019-09-28 DIAGNOSIS — E119 Type 2 diabetes mellitus without complications: Secondary | ICD-10-CM | POA: Insufficient documentation

## 2019-09-28 DIAGNOSIS — T383X1A Poisoning by insulin and oral hypoglycemic [antidiabetic] drugs, accidental (unintentional), initial encounter: Secondary | ICD-10-CM | POA: Diagnosis not present

## 2019-09-28 DIAGNOSIS — T50901A Poisoning by unspecified drugs, medicaments and biological substances, accidental (unintentional), initial encounter: Secondary | ICD-10-CM

## 2019-09-28 DIAGNOSIS — T887XXA Unspecified adverse effect of drug or medicament, initial encounter: Secondary | ICD-10-CM | POA: Diagnosis not present

## 2019-09-28 DIAGNOSIS — E1165 Type 2 diabetes mellitus with hyperglycemia: Secondary | ICD-10-CM | POA: Diagnosis not present

## 2019-09-28 DIAGNOSIS — T50904A Poisoning by unspecified drugs, medicaments and biological substances, undetermined, initial encounter: Secondary | ICD-10-CM | POA: Diagnosis not present

## 2019-09-28 DIAGNOSIS — I10 Essential (primary) hypertension: Secondary | ICD-10-CM | POA: Diagnosis not present

## 2019-09-28 LAB — BASIC METABOLIC PANEL
Anion gap: 11 (ref 5–15)
BUN: 25 mg/dL — ABNORMAL HIGH (ref 8–23)
CO2: 26 mmol/L (ref 22–32)
Calcium: 9.4 mg/dL (ref 8.9–10.3)
Chloride: 104 mmol/L (ref 98–111)
Creatinine, Ser: 0.94 mg/dL (ref 0.44–1.00)
GFR calc Af Amer: >60 mL/min (ref >60)
GFR calc non Af Amer: 59 mL/min — ABNORMAL LOW (ref >60)
Glucose, Bld: 223 mg/dL — ABNORMAL HIGH (ref 70–99)
Potassium: 3.4 mmol/L — ABNORMAL LOW (ref 3.5–5.1)
Sodium: 141 mmol/L (ref 135–145)

## 2019-09-28 LAB — CBC
HCT: 39.7 % (ref 36.0–46.0)
Hemoglobin: 13.2 g/dL (ref 12.0–15.0)
MCH: 29.7 pg (ref 26.0–34.0)
MCHC: 33.2 g/dL (ref 30.0–36.0)
MCV: 89.2 fL (ref 80.0–100.0)
Platelets: 276 10*3/uL (ref 150–400)
RBC: 4.45 MIL/uL (ref 3.87–5.11)
RDW: 12.6 % (ref 11.5–15.5)
WBC: 10.5 10*3/uL (ref 4.0–10.5)
nRBC: 0 % (ref 0.0–0.2)

## 2019-09-28 LAB — GLUCOSE, CAPILLARY
Glucose-Capillary: 212 mg/dL — ABNORMAL HIGH (ref 70–99)
Glucose-Capillary: 215 mg/dL — ABNORMAL HIGH (ref 70–99)
Glucose-Capillary: 207 mg/dL — ABNORMAL HIGH (ref 70–99)
Glucose-Capillary: 188 mg/dL — ABNORMAL HIGH (ref 70–99)
Glucose-Capillary: 204 mg/dL — ABNORMAL HIGH (ref 70–99)
Glucose-Capillary: 184 mg/dL — ABNORMAL HIGH (ref 70–99)

## 2019-09-28 MED ORDER — PANCRELIPASE (LIP-PROT-AMYL) 12000-38000 UNITS PO CPEP
36000.0000 [IU] | ORAL_CAPSULE | ORAL | Status: AC
  Administered 2019-09-28: 36000 [IU] via ORAL
  Filled 2019-09-28: qty 3

## 2019-09-28 NOTE — ED Triage Notes (Addendum)
Per EMS, they were called out after patient accidentally took 28 units of humalog prior to eating. Patient's normal dose is 8 units. Patient was eating icing upon EMS arrival. Patient states she takes 28 units of her long acting insulin at night and got the dose confused. Patient lives with family but they are out of town and she was uncomfortable being alone this evening. Patient denies any complaints at this time.   EMS had cbg 280, then 292  EMS contacted poison control who called to recommend q30 minute cbg x2 hours, then q1 hour cbg until 8 pm. At that time if patient is asymptomatic, they feel she should be safe to discharge home.

## 2019-09-28 NOTE — ED Notes (Signed)
Pt states earlier today she accidentally took too much of her wrong insuline. Pt denies pain and any other complications. Pt resting in bed.  Pt noted to have food and drink tray

## 2019-09-28 NOTE — ED Notes (Signed)
Pt accidentally took too much of her short acting insulin. Ate cake immediately upon realizing. Pt now eating at bedside.

## 2019-09-28 NOTE — ED Provider Notes (Addendum)
ER Provider Note       Time seen: 6:03 PM    I have reviewed the vital signs and the nursing notes.  HISTORY   Chief Complaint Drug Overdose ([name])    HPI Beth Tyler is a 74 y.o. female with a history of anemia, arthritis, diabetes, gastritis, GERD, hyperlipidemia, hypertension who presents today for accidental insulin overdose.  Patient states she accidentally overdosed on insulin.  She took 28 units of [name] prior to eating when her actual dose was post be 8 units.  She ate birthday cake to counteract this and otherwise denies complaints.  She denies any recent illness.  Past Medical History:  Diagnosis Date  . Anemia   . Arthritis   . B12 deficiency   . Carpal tunnel syndrome, bilateral   . Diabetes mellitus (HCC) 02/17/2012  . Diabetes mellitus, type II (HCC)   . Dyspnea   . Gastritis   . GERD (gastroesophageal reflux disease)   . Hypercholesterolemia   . Hypertension   . IBS (irritable bowel syndrome)   . Neuropathy    legs  . Pernicious anemia   . Vertigo    no episodes fo 9 or 10 yrs    Past Surgical History:  Procedure Laterality Date  . ANTERIOR VITRECTOMY Right 12/01/2017   Procedure: ANTERIOR VITRECTOMY;  Surgeon: Nevada Crane, MD;  Location: [facility];  Service: Ophthalmology;  Laterality: Right;  . BREAST BIOPSY Right 1980   neg  . BREAST SURGERY  1980   benign cyst removal  . CATARACT EXTRACTION W/PHACO Left 10/13/2017   Procedure: CATARACT EXTRACTION PHACO AND INTRAOCULAR LENS PLACEMENT (IOC)  LEFT  DIABETES;  Surgeon: Nevada Crane, MD;  Location: [facility];  Service: Ophthalmology;  Laterality: Left;  Diabetic - insulin and oral meds  . CATARACT EXTRACTION W/PHACO Right 12/01/2017   Procedure: CATARACT EXTRACTION PHACO AND INTRAOCULAR LENS PLACEMENT (IOC)  RIGHT DIABETIC;  Surgeon: Nevada Crane, MD;  Location: [facility];  Service: Ophthalmology;  Laterality: Right;  Diabetic - insulin and oral   . CHOLECYSTECTOMY  1983    Allergies Hydrocodone, Hydrocodone-acetaminophen, Oxycodone-acetaminophen, Percocet [oxycodone-acetaminophen], Demeclocycline, Other, Penicillins, and Tetracyclines & related  Review of Systems Constitutional: Negative for fever. Cardiovascular: Negative for chest pain. Respiratory: Negative for shortness of breath. Gastrointestinal: Negative for abdominal pain, vomiting and diarrhea. Musculoskeletal: Negative for back pain. Skin: Negative for rash. Neurological: Negative for headaches, focal weakness or numbness.  All systems negative/normal/unremarkable except as stated in the HPI  ____________________________________________   PHYSICAL EXAM:  VITAL SIGNS: Vitals:   09/28/19 1652 09/28/19 1701  BP: (!) 171/64   Pulse: 71   Resp:  18  Temp:  98.4 F (36.9 C)  SpO2: 98%     Constitutional: Alert and oriented. Well appearing and in no distress. Eyes: Conjunctivae are normal. Normal extraocular movements. ENT      Head: Normocephalic and atraumatic.      Nose: No congestion/rhinnorhea.      Mouth/Throat: Mucous membranes are moist.      Neck: No stridor. Cardiovascular: Normal rate, regular rhythm. No murmurs, rubs, or gallops. Respiratory: Normal respiratory effort without tachypnea nor retractions. Breath sounds are clear and equal bilaterally. No wheezes/rales/rhonchi. Gastrointestinal: Soft and nontender. Normal bowel sounds Musculoskeletal: Nontender with normal range of motion in extremities. No lower extremity tenderness nor edema. Neurologic:  Normal speech and language. No gross focal neurologic deficits are appreciated.  Skin:  Skin is warm, dry and intact. No rash noted.  Psychiatric: Speech and behavior are normal.  ____________________________________________   LABS (pertinent positives/negatives)  Labs Reviewed  GLUCOSE, CAPILLARY - Abnormal; Notable for the following components:      Result Value   Glucose-Capillary 212  (*)    All other components within normal limits  BASIC METABOLIC PANEL - Abnormal; Notable for the following components:   Potassium 3.4 (*)    Glucose, Bld 223 (*)    BUN 25 (*)    GFR calc non Af Amer 59 (*)    All other components within normal limits  GLUCOSE, CAPILLARY - Abnormal; Notable for the following components:   Glucose-Capillary 215 (*)    All other components within normal limits  GLUCOSE, CAPILLARY - Abnormal; Notable for the following components:   Glucose-Capillary 207 (*)    All other components within normal limits  GLUCOSE, CAPILLARY - Abnormal; Notable for the following components:   Glucose-Capillary 188 (*)    All other components within normal limits  GLUCOSE, CAPILLARY - Abnormal; Notable for the following components:   Glucose-Capillary 204 (*)    All other components within normal limits  CBC  CBG MONITORING, ED  CBG MONITORING, ED  CBG MONITORING, ED  CBG MONITORING, ED   DIFFERENTIAL DIAGNOSIS  Accidental overdose, hypoglycemia, electrolyte abnormality  ASSESSMENT AND PLAN  Accidental overdose of insulin   Plan: The patient had presented for an accidental overdose on her insulin. Patient's labs were overall reassuring.  Apparently she was able to counteract this with eating sugary foods and a full meal here.  She has been observed for at least 4 hours since her insulin injection without any worsening of her blood sugar or hypoglycemia.  She is cleared for outpatient follow-up.  Lenise Arena MD    Note: This note was generated in part or whole with voice recognition software. Voice recognition is usually quite accurate but there are transcription errors that can and very often do occur. I apologize for any typographical errors that were not detected and corrected.     Earleen Newport, MD 09/28/19 1806    Earleen Newport, MD 09/28/19 [phone]

## 2019-09-28 NOTE — ED Notes (Signed)
Pt had 20G to the Left AC. Catheter removed and intact. Bleeding controlled.

## 2019-09-28 NOTE — ED Notes (Signed)
Pt given food per dr Scotty Court to help maintain sugar

## 2019-09-28 NOTE — ED Triage Notes (Signed)
CBG has decreased to 214

## 2019-10-01 ENCOUNTER — Other Ambulatory Visit: Payer: Self-pay | Admitting: Internal Medicine

## 2019-10-04 ENCOUNTER — Other Ambulatory Visit: Payer: Self-pay | Admitting: Internal Medicine

## 2019-10-14 ENCOUNTER — Ambulatory Visit (INDEPENDENT_AMBULATORY_CARE_PROVIDER_SITE_OTHER): Payer: Medicare HMO | Admitting: Pharmacist

## 2019-10-14 DIAGNOSIS — I480 Paroxysmal atrial fibrillation: Secondary | ICD-10-CM

## 2019-10-14 DIAGNOSIS — Z794 Long term (current) use of insulin: Secondary | ICD-10-CM | POA: Diagnosis not present

## 2019-10-14 DIAGNOSIS — E1122 Type 2 diabetes mellitus with diabetic chronic kidney disease: Secondary | ICD-10-CM | POA: Diagnosis not present

## 2019-10-14 DIAGNOSIS — N183 Chronic kidney disease, stage 3 unspecified: Secondary | ICD-10-CM | POA: Diagnosis not present

## 2019-10-14 NOTE — Patient Instructions (Signed)
Visit Information  Goals Addressed              This Visit's Progress     Patient Stated   .  "I can't afford my medications" (pt-stated)        CARE PLAN ENTRY (see longtitudinal plan of care for additional care plan information)   Current Barriers:  . Diabetes: CONTROLLED though sugars appear more elevated lately, most recent A1c 6.9%, complicated by Afib, HTN, HLD o Notes that she had a fantastic birthday with her family. Is helping her granddaughter plan for her upcoming wedding o Due for f/u with PCP at end of the month . Current antihyperglycemic regimen: Basaglar 28 units daily, [name] 3-8 units PRN, metformin 1000 mg BID o Avoiding GLP1 d/t GI disease (IBS); however, patient is doing much better now that she has pancreatic enzyme replacement therapy  . Denies any issues with low blood sugars . Current glucose readings:  o Fasting: ~160-200s o 2 hours after meals ~180s-200s, sometimes lower . Current exercise o Does leg exercises/chair exercises; also uses a leg massager . Cardiovascular risk reduction:  o Current hypertensive regimen: amlodipine 10 mg QPM, metoprolol tartrate 25 mg BID o Current hyperlipidemia regimen: simvastatin 10 mg daily; LDL at goal <70 on last check. . Atrial Fibrillation: Eliquis 5 mg BID; notes that she mailed back patient assistance application information a while ago. It was not received by CPhT. . Pancreatic insufficiency: [name] 3 capsules w/ meals and 2 with snacks  Pharmacist Clinical Goal(s):  Marland Kitchen Over the next 90 days, patient with work with PharmD and primary care provider to address optimized glycemic management  Interventions: . Comprehensive medication review performed, medication list updated in electronic medical record . Inter-disciplinary care team collaboration (see longitudinal plan of care) . Reviewed that current reported sugars indicate an A1c <7%. Will collaborate w/ clinic staff to schedule appt w/ PCP and lab work prior  after 10/26/18.  Marland Kitchen Discussed SGLT2, pending A1c and renal function. Discussed renal and CV benefit, as well as benefit in reducing insulin burden.  . Will collaborate w/ CPhT to mail Eliquis application to patient. Patient will sign and bring back to the office w/ 2021 SSI statement and EOB showing total out of pocket spend on medications this year. Once received, will pass along to CPhT for follow up  Patient Self Care Activities:  . Patient will check blood glucose QID, document, and provide at future appointments . Patient will take medications as prescribed . Patient will report any questions or concerns to provider   Please see past updates related to this goal by clicking on the "Past Updates" button in the selected goal         The patient verbalized understanding of instructions provided today and declined a print copy of patient instruction materials.   Plan: - Scheduled f/u call in ~ 6 weeks  Catie Feliz Beam, PharmD, [place], CPP Clinical Pharmacist [facility] [employer] [phone]

## 2019-10-14 NOTE — Chronic Care Management (AMB) (Signed)
Chronic Care Management   Follow Up Note   10/14/2019 Name: Beth Tyler MRN: [id number] DOB: 01-08-1946  Referred by: Beth Glen Allen, MD Reason for referral : Chronic Care Management (Medication Management)   Beth Tyler is a 74 y.o. year old female who is a primary care patient of Beth Raysal, MD. The CCM team was consulted for assistance with chronic disease management and care coordination needs.    Contacted patient for medication management review.   Review of patient status, including review of consultants reports, relevant laboratory and other test results, and collaboration with appropriate care team members and the patient's provider was performed as part of comprehensive patient evaluation and provision of chronic care management services.    SDOH (Social Determinants of Health) assessments performed: Yes See Care Plan activities for detailed interventions related to SDOH)  SDOH Interventions     Most Recent Value  SDOH Interventions  Financial Strain Interventions Other (Comment)  [medication assistance evaluation]       Outpatient Encounter Medications as of 10/14/2019  Medication Sig Note  . amLODipine (NORVASC) 10 MG tablet Take 1 tablet by mouth once daily   . apixaban (ELIQUIS) 5 MG TABS tablet Take 1 tablet (5 mg total) by mouth 2 (two) times daily.   . fluticasone (CUTIVATE) 0.05 % cream Apply 1 application topically as needed. Use as directed.   . Insulin Glargine (BASAGLAR KWIKPEN) 100 UNIT/ML SOPN Inject 0.28 mLs (28 Units total) into the skin every morning.   . insulin lispro ([name]) 100 UNIT/ML injection Inject 0.04-0.08 mLs (4-8 Units total) into the skin 3 (three) times daily with meals. 10/14/2019: ~6-8 units TID with meals   . Insulin Pen Needle 32G X 4 MM MISC Use to inject insulin three times daily   . lipase/protease/amylase ([name]) 36000 UNITS CPEP capsule Take 36,000-72,000 Units by mouth See admin instructions. Take 3 capsules by mouth  three times daily with meals and take 2 capsule by mouth daily with snacks   . metFORMIN (GLUCOPHAGE-XR) 500 MG 24 hr tablet Take 2 tablets (1,000 mg total) by mouth 2 (two) times daily.   Beth Tyler test strip USE 1 STRIP TO CHECK GLUCOSE TWICE DAILY (Patient taking differently: 1 each by Other route 2 (two) times daily. )   . simvastatin (ZOCOR) 10 MG tablet TAKE 1 TABLET BY MOUTH AT BEDTIME   . triamcinolone cream (KENALOG) 0.1 % APPLY  CREAM TOPICALLY TWICE DAILY   . acetaminophen (TYLENOL) 500 MG tablet Take 500-1,000 mg by mouth every 6 (six) hours as needed for mild pain or moderate pain.    . magnesium oxide (MAG-OX) 400 (241.3 Mg) MG tablet Take 1 tablet by mouth once daily   . metoprolol tartrate (LOPRESSOR) 25 MG tablet Take 25 mg by mouth 2 (two) times daily.   . pantoprazole (PROTONIX) 40 MG tablet TAKE 1 TABLET BY MOUTH TWICE DAILY BEFORE MEAL(S)   . tizanidine (ZANAFLEX) 2 MG capsule Take 1 capsule (2 mg total) by mouth daily.    No facility-administered encounter medications on file as of 10/14/2019.     Objective:   Goals Addressed              This Visit's Progress     Patient Stated   .  "I can't afford my medications" (pt-stated)        CARE PLAN ENTRY (see longtitudinal plan of care for additional care plan information)   Current Barriers:  . Diabetes: CONTROLLED though sugars appear  more elevated lately, most recent A1c 6.9%, complicated by Afib, HTN, HLD o Notes that she had a fantastic birthday with her family. Is helping her granddaughter plan for her upcoming wedding o Due for f/u with PCP at end of the month . Current antihyperglycemic regimen: Basaglar 28 units daily, [name] 3-8 units PRN, metformin 1000 mg BID o Avoiding GLP1 d/t GI disease (IBS); however, patient is doing much better now that she has pancreatic enzyme replacement therapy  . Denies any issues with low blood sugars . Current glucose readings:  o Fasting: ~160-200s o 2 hours after  meals ~180s-200s, sometimes lower . Current exercise o Does leg exercises/chair exercises; also uses a leg massager . Cardiovascular risk reduction:  o Current hypertensive regimen: amlodipine 10 mg QPM, metoprolol tartrate 25 mg BID o Current hyperlipidemia regimen: simvastatin 10 mg daily; LDL at goal <70 on last check. . Atrial Fibrillation: Eliquis 5 mg BID; notes that she mailed back patient assistance application information a while ago. It was not received by CPhT. . Pancreatic insufficiency: [name] 3 capsules w/ meals and 2 with snacks  Pharmacist Clinical Goal(s):  Marland Kitchen Over the next 90 days, patient with work with PharmD and primary care provider to address optimized glycemic management  Interventions: . Comprehensive medication review performed, medication list updated in electronic medical record . Inter-disciplinary care team collaboration (see longitudinal plan of care) . Reviewed that current reported sugars indicate an A1c <7%. Will collaborate w/ clinic staff to schedule appt w/ PCP and lab work prior after 10/26/18.  Marland Kitchen Discussed SGLT2, pending A1c and renal function. Discussed renal and CV benefit, as well as benefit in reducing insulin burden.  . Will collaborate w/ CPhT to mail Eliquis application to patient. Patient will sign and bring back to the office w/ 2021 SSI statement and EOB showing total out of pocket spend on medications this year. Once received, will pass along to CPhT for follow up  Patient Self Care Activities:  . Patient will check blood glucose QID, document, and provide at future appointments . Patient will take medications as prescribed . Patient will report any questions or concerns to provider   Please see past updates related to this goal by clicking on the "Past Updates" button in the selected goal          Plan: - Scheduled f/u call in ~ 6 weeks  Catie Feliz Beam, PharmD, [place], CPP Clinical Pharmacist [facility] [employer] [phone]

## 2019-10-15 ENCOUNTER — Other Ambulatory Visit: Payer: Self-pay | Admitting: Pharmacy Technician

## 2019-10-15 NOTE — Patient Outreach (Signed)
[facility]) Care Management  10/15/2019  Beth Tyler July 19, 1945 [id number]   Received in basket message from embedded [facility] RPh Catie Feliz Beam informing that patient mailed back her portion of the BMS application for Eliquis in April. However, I never received it.  Prepared another application to be mailed to patient.  Will follow up with patient in 5-15 business days to inquire if she has received the application.  Emanuel Campos P. Caelin Rosen, CPhT [facility] [employer]  [phone]

## 2019-10-15 NOTE — Progress Notes (Signed)
I have reviewed the above note and agree. I was available to the pharmacist for consultation.  Freeda Spivey, MD 

## 2019-10-21 ENCOUNTER — Telehealth: Payer: Self-pay | Admitting: Internal Medicine

## 2019-10-21 NOTE — Telephone Encounter (Signed)
lvm to schedule appt with Dr Lorin Picket and labs after 10/26/19

## 2019-10-27 ENCOUNTER — Other Ambulatory Visit: Payer: Self-pay | Admitting: Pharmacy Technician

## 2019-10-27 NOTE — Patient Outreach (Signed)
[facility]) Care Management  10/27/2019  Beth Tyler 09-26-1945 [id number]    Unsuccessful call placed to patient regarding patient assistance application(s) for Eliquis with [employer] , HIPAA compliant voicemail left.   Was calling to inquire if patient received the application that was mailed to her on 10/15/2019. Unfortunately she did not answer the phone.   Follow up:  Will follow up with patient in 3-7 business days if call is not returned.  [name] P. Sargon Scouten, CPhT [employer] [employer]  [phone]

## 2019-11-01 ENCOUNTER — Other Ambulatory Visit: Payer: Self-pay | Admitting: Pharmacy Technician

## 2019-11-01 NOTE — Patient Outreach (Signed)
[facility]) Care Management  11/01/2019  Beth Tyler 05-Jul-1945 [id number]   Successful call placed to patient regarding patient assistance application(s) for Eliquis with [employer] , HIPAA identifiers verified.   Patient informs she received the application and her OOP spend report from her insurance. She informs she placed the completed application and supporting documents in the mail on Friday.   Follow up:  Will route note to embedded [facility] RPh Catie Feliz Beam for case closure if document(s) have not been received in the next 15 business days.  Beth Tyler, CPhT [employer]  [phone]

## 2019-11-02 ENCOUNTER — Other Ambulatory Visit: Payer: Self-pay | Admitting: Pharmacy Technician

## 2019-11-02 NOTE — Patient Outreach (Signed)
[facility]) Care Management  11/02/2019  EVERLENA MACKLEY 08-16-1945 [id number]   Received both patient and provider portion(s) of patient assistance application(s) for Eliquis. Faxed completed application and required documents into BMS.  Will follow up with company(ies) in 5-10 business days to check status of application(s).  Duran Ohern P. Zayonna Ayuso, CPhT [employer]  [phone]

## 2019-11-04 ENCOUNTER — Other Ambulatory Visit: Payer: Self-pay | Admitting: Internal Medicine

## 2019-11-04 DIAGNOSIS — Z794 Long term (current) use of insulin: Secondary | ICD-10-CM

## 2019-11-04 DIAGNOSIS — E1140 Type 2 diabetes mellitus with diabetic neuropathy, unspecified: Secondary | ICD-10-CM

## 2019-11-10 ENCOUNTER — Ambulatory Visit: Payer: Medicare HMO | Admitting: Pharmacist

## 2019-11-10 ENCOUNTER — Telehealth: Payer: Self-pay | Admitting: Pharmacist

## 2019-11-10 ENCOUNTER — Telehealth: Payer: Medicare HMO

## 2019-11-10 ENCOUNTER — Other Ambulatory Visit: Payer: Self-pay | Admitting: Pharmacy Technician

## 2019-11-10 DIAGNOSIS — I480 Paroxysmal atrial fibrillation: Secondary | ICD-10-CM

## 2019-11-10 NOTE — Telephone Encounter (Signed)
  Chronic Care Management   Note  11/10/2019 Name: Beth Tyler MRN: [id number] DOB: Jul 07, 1945   Attempted to contact patient to f/u on medication access needs per CPhT note dated today. Left HIPAA compliant message for patient to return my call at their convenience.    Plan: - If I do not hear back from the patient by end of business today, will attempt outreach again in the next 1-2 business days  Catie Feliz Beam, PharmD, [place], CPP Clinical Pharmacist [facility] [employer] [phone]

## 2019-11-10 NOTE — Patient Instructions (Signed)
Visit Information  Goals Addressed              This Visit's Progress     Patient Stated   .  "I can't afford my medications" (pt-stated)        CARE PLAN ENTRY (see longtitudinal plan of care for additional care plan information)   Current Barriers:  . Diabetes: CONTROLLED though sugars appear more elevated lately, most recent A1c 6.9%, complicated by Afib, HTN, HLD . Current antihyperglycemic regimen: Basaglar 28 units daily, [name] 3-8 units PRN, metformin 1000 mg BID o Avoiding GLP1 d/t GI disease (IBS) . Cardiovascular risk reduction:  o Current hypertensive regimen: amlodipine 10 mg QPM, metoprolol tartrate 25 mg BID o Current hyperlipidemia regimen: simvastatin 10 mg daily; LDL at goal <70 on last check. . Atrial Fibrillation: Eliquis 5 mg BID- received message from CPhT that there was a discrepancy between the income submitted by the patient and the income that BMS found via their automated soft check system. Because of this, she has not yet spent enough out of pocket to qualify for Eliquis assistance from the drug company . Pancreatic insufficiency: [name] 3 capsules w/ meals and 2 with snacks  Pharmacist Clinical Goal(s):  Marland Kitchen Over the next 90 days, patient with work with PharmD and primary care provider to address optimized glycemic management  Interventions: . Comprehensive medication review performed, medication list updated in electronic medical record . Inter-disciplinary care team collaboration (see longitudinal plan of care) . Discussed patient assistance w/ patient (see note from CPhT today). She notes that she did file taxes for 2020, but her sister would have her tax return. She will see if her sister can mail a copy of her tax return to [place] directly at [facility] office. She confirms that her only income is SSI every month, and we submitted her SSI awards letter previously. She notes that she still may not spend enough OOP on copays in 2021 to qualify for Eliquis  assistance.   Patient Self Care Activities:  . Patient will check blood glucose QID, document, and provide at future appointments . Patient will take medications as prescribed . Patient will report any questions or concerns to provider   Please see past updates related to this goal by clicking on the "Past Updates" button in the selected goal         The patient verbalized understanding of instructions provided today and declined a print copy of patient instruction materials.    Plan:  - Will outreach patient as previously scheduled  Catie Feliz Beam, PharmD, [place], CPP Clinical Pharmacist [facility] [employer] [phone]

## 2019-11-10 NOTE — Patient Outreach (Signed)
[facility]) Care Management  11/10/2019  Beth Tyler [birth year] [id number]   Care coordination call placed to BMS in regards to patient's Eliquis application.  Spoke to [name] who informs patient has temporarily been denied as she has not met the 3% OOP program requirement. Also, [name] that they need to verify if patient files taxes as the income reported on the application and submitted does not match the income that they received back from the software they use at the foundation to verify income.  Will route note to embedded [place] for assistance.  [name] P. Brice Potteiger, [place]  [phone]

## 2019-11-10 NOTE — Telephone Encounter (Signed)
Patient returned call. See CCM documentation 

## 2019-11-10 NOTE — Progress Notes (Signed)
I have reviewed the above note and agree. I was available to the pharmacist for consultation.  Cattie Tineo, MD 

## 2019-11-10 NOTE — Chronic Care Management (AMB) (Signed)
Chronic Care Management   Follow Up Note   11/10/2019 Name: Beth Tyler MRN: [id number] DOB: [birth year]  Referred by: Dale Wilton, MD Reason for referral : Chronic Care Management (Medication Management)   Beth Tyler is a 74 y.o. year old female who is a primary care patient of Dale [place], MD. The CCM team was consulted for assistance with chronic disease management and care coordination needs.    Collaboration w/ patient and CPhT on medication access issues today.   Review of patient status, including review of consultants reports, relevant laboratory and other test results, and collaboration with appropriate care team members and the patient's provider was performed as part of comprehensive patient evaluation and provision of chronic care management services.    SDOH (Social Determinants of Health) assessments performed: Yes See Care Plan activities for detailed interventions related to SDOH)  SDOH Interventions     Most Recent Value  SDOH Interventions  Financial Strain Interventions Other (Comment)  [patient assistance]       Outpatient Encounter Medications as of 11/10/2019  Medication Sig Note  . acetaminophen (TYLENOL) 500 MG tablet Take 500-1,000 mg by mouth every 6 (six) hours as needed for mild pain or moderate pain.    Marland Kitchen amLODipine (NORVASC) 10 MG tablet Take 1 tablet by mouth once daily   . apixaban (ELIQUIS) 5 MG TABS tablet Take 1 tablet (5 mg total) by mouth 2 (two) times daily.   . BD PEN NEEDLE NANO 2ND GEN 32G X 4 MM MISC USE TO INJECT INSULIN 3 TIMES DAILY   . fluticasone (CUTIVATE) 0.05 % cream Apply 1 application topically as needed. Use as directed.   . Insulin Glargine (BASAGLAR KWIKPEN) 100 UNIT/ML SOPN Inject 0.28 mLs (28 Units total) into the skin every morning.   . insulin lispro ([name]) 100 UNIT/ML injection Inject 0.04-0.08 mLs (4-8 Units total) into the skin 3 (three) times daily with meals. 10/14/2019: ~6-8 units TID with meals   .  lipase/protease/amylase ([name]) 36000 UNITS CPEP capsule Take 36,000-72,000 Units by mouth See admin instructions. Take 3 capsules by mouth three times daily with meals and take 2 capsule by mouth daily with snacks   . magnesium oxide (MAG-OX) 400 (241.3 Mg) MG tablet Take 1 tablet by mouth once daily   . metFORMIN (GLUCOPHAGE-XR) 500 MG 24 hr tablet Take 2 tablets (1,000 mg total) by mouth 2 (two) times daily.   . metoprolol tartrate (LOPRESSOR) 25 MG tablet Take 25 mg by mouth 2 (two) times daily.   Letta Pate VERIO test strip USE 1 STRIP TO CHECK GLUCOSE TWICE DAILY (Patient taking differently: 1 each by Other route 2 (two) times daily. )   . pantoprazole (PROTONIX) 40 MG tablet TAKE 1 TABLET BY MOUTH TWICE DAILY BEFORE MEAL(S)   . simvastatin (ZOCOR) 10 MG tablet TAKE 1 TABLET BY MOUTH AT BEDTIME   . tizanidine (ZANAFLEX) 2 MG capsule Take 1 capsule (2 mg total) by mouth daily.   Marland Kitchen triamcinolone cream (KENALOG) 0.1 % APPLY  CREAM TOPICALLY TWICE DAILY    No facility-administered encounter medications on file as of 11/10/2019.     Objective:   Goals Addressed              This Visit's Progress     Patient Stated   .  "I can't afford my medications" (pt-stated)        CARE PLAN ENTRY (see longtitudinal plan of care for additional care plan information)   Current Barriers:  .  Diabetes: CONTROLLED though sugars appear more elevated lately, most recent A1c 6.9%, complicated by Afib, HTN, HLD . Current antihyperglycemic regimen: Basaglar 28 units daily, [name] 3-8 units PRN, metformin 1000 mg BID o Avoiding GLP1 d/t GI disease (IBS) . Cardiovascular risk reduction:  o Current hypertensive regimen: amlodipine 10 mg QPM, metoprolol tartrate 25 mg BID o Current hyperlipidemia regimen: simvastatin 10 mg daily; LDL at goal <70 on last check. . Atrial Fibrillation: Eliquis 5 mg BID- received message from CPhT that there was a discrepancy between the income submitted by the patient and the  income that [employer] found via their automated soft check system. Because of this, she has not yet spent enough out of pocket to qualify for Eliquis assistance from the drug company . Pancreatic insufficiency: [name] 3 capsules w/ meals and 2 with snacks  Pharmacist Clinical Goal(s):  Marland Kitchen Over the next 90 days, patient with work with PharmD and primary care provider to address optimized glycemic management  Interventions: . Comprehensive medication review performed, medication list updated in electronic medical record . Inter-disciplinary care team collaboration (see longitudinal plan of care) . Discussed patient assistance w/ patient (see note from CPhT today). She notes that she did file taxes for 2020, but her sister would have her tax return. She will see if her sister can mail a copy of her tax return to [place] directly at [facility] office. She confirms that her only income is SSI every month, and we submitted her SSI awards letter previously. She notes that she still may not spend enough OOP on copays in 2021 to qualify for Eliquis assistance.   Patient Self Care Activities:  . Patient will check blood glucose QID, document, and provide at future appointments . Patient will take medications as prescribed . Patient will report any questions or concerns to provider   Please see past updates related to this goal by clicking on the "Past Updates" button in the selected goal          Plan:  - Will outreach patient as previously scheduled  Catie Feliz Beam, PharmD, [place], CPP Clinical Pharmacist [facility] [employer] [phone]

## 2019-11-16 ENCOUNTER — Ambulatory Visit (INDEPENDENT_AMBULATORY_CARE_PROVIDER_SITE_OTHER): Payer: Medicare HMO | Admitting: Pharmacist

## 2019-11-16 DIAGNOSIS — Z794 Long term (current) use of insulin: Secondary | ICD-10-CM

## 2019-11-16 DIAGNOSIS — I480 Paroxysmal atrial fibrillation: Secondary | ICD-10-CM

## 2019-11-16 NOTE — Patient Instructions (Signed)
Visit Information  Goals Addressed              This Visit's Progress     Patient Stated   .  "I can't afford my medications" (pt-stated)        CARE PLAN ENTRY (see longtitudinal plan of care for additional care plan information)   Current Barriers:  . Social, financial, and community barriers:  o Patient notes that she spoke with her sister and she did NOT file taxes for 2020, just 2019. She is absolutely confused as to how BMS found her income as 36,000, as she only receives income from her SSI every month.  . Diabetes: CONTROLLED though sugars appear more elevated lately, most recent A1c 6.9%, complicated by Afib, HTN, HLD . Current antihyperglycemic regimen: Basaglar 28 units daily, [name] 3-8 units PRN, metformin 1000 mg BID o Avoiding GLP1 d/t GI disease (IBS) . Cardiovascular risk reduction:  o Current hypertensive regimen: amlodipine 10 mg QPM, metoprolol tartrate 25 mg BID o Current hyperlipidemia regimen: simvastatin 10 mg daily; LDL at goal <70 on last check. . Atrial Fibrillation: Eliquis 5 mg BID . Pancreatic insufficiency: [name] 3 capsules w/ meals and 2 with snacks  Pharmacist Clinical Goal(s):  Marland Kitchen Over the next 90 days, patient with work with PharmD and primary care provider to address optimized glycemic management  Interventions: . Comprehensive medication review performed, medication list updated in electronic medical record . Inter-disciplinary care team collaboration (see longitudinal plan of care) . Will collaborate w/ CPhT to determine next steps for medication access  Patient Self Care Activities:  . Patient will check blood glucose QID, document, and provide at future appointments . Patient will take medications as prescribed . Patient will report any questions or concerns to provider   Please see past updates related to this goal by clicking on the "Past Updates" button in the selected goal         The patient verbalized understanding of  instructions provided today and declined a print copy of patient instruction materials.    Plan:  - Will continue to collaborate w/ patient and CPhT as above  Catie Feliz Beam, PharmD, [place], CPP Clinical Pharmacist [facility] [employer] [phone]

## 2019-11-16 NOTE — Chronic Care Management (AMB) (Signed)
Chronic Care Management   Follow Up Note   11/16/2019 Name: Beth Tyler MRN: [id number] DOB: Oct 27, 1945  Referred by: Dale Winkler, MD Reason for referral : Chronic Care Management (Medication Management)   Beth Tyler is a 74 y.o. year old female who is a primary care patient of Dale Balta, MD. The CCM team was consulted for assistance with chronic disease management and care coordination needs.    Patient called me today regarding medication access  Review of patient status, including review of consultants reports, relevant laboratory and other test results, and collaboration with appropriate care team members and the patient's provider was performed as part of comprehensive patient evaluation and provision of chronic care management services.    SDOH (Social Determinants of Health) assessments performed: Yes See Care Plan activities for detailed interventions related to [facility])     Outpatient Encounter Medications as of 11/16/2019  Medication Sig Note  . acetaminophen (TYLENOL) 500 MG tablet Take 500-1,000 mg by mouth every 6 (six) hours as needed for mild pain or moderate pain.    Marland Kitchen amLODipine (NORVASC) 10 MG tablet Take 1 tablet by mouth once daily   . apixaban (ELIQUIS) 5 MG TABS tablet Take 1 tablet (5 mg total) by mouth 2 (two) times daily.   . BD PEN NEEDLE NANO 2ND GEN 32G X 4 MM MISC USE TO INJECT INSULIN 3 TIMES DAILY   . fluticasone (CUTIVATE) 0.05 % cream Apply 1 application topically as needed. Use as directed.   . Insulin Glargine (BASAGLAR KWIKPEN) 100 UNIT/ML SOPN Inject 0.28 mLs (28 Units total) into the skin every morning.   . insulin lispro ([name]) 100 UNIT/ML injection Inject 0.04-0.08 mLs (4-8 Units total) into the skin 3 (three) times daily with meals. 10/14/2019: ~6-8 units TID with meals   . lipase/protease/amylase ([name]) 36000 UNITS CPEP capsule Take 36,000-72,000 Units by mouth See admin instructions. Take 3 capsules by mouth three times daily  with meals and take 2 capsule by mouth daily with snacks   . magnesium oxide (MAG-OX) 400 (241.3 Mg) MG tablet Take 1 tablet by mouth once daily   . metFORMIN (GLUCOPHAGE-XR) 500 MG 24 hr tablet Take 2 tablets (1,000 mg total) by mouth 2 (two) times daily.   . metoprolol tartrate (LOPRESSOR) 25 MG tablet Take 25 mg by mouth 2 (two) times daily.   Letta Pate VERIO test strip USE 1 STRIP TO CHECK GLUCOSE TWICE DAILY (Patient taking differently: 1 each by Other route 2 (two) times daily. )   . pantoprazole (PROTONIX) 40 MG tablet TAKE 1 TABLET BY MOUTH TWICE DAILY BEFORE MEAL(S)   . simvastatin (ZOCOR) 10 MG tablet TAKE 1 TABLET BY MOUTH AT BEDTIME   . tizanidine (ZANAFLEX) 2 MG capsule Take 1 capsule (2 mg total) by mouth daily.   Marland Kitchen triamcinolone cream (KENALOG) 0.1 % APPLY  CREAM TOPICALLY TWICE DAILY    No facility-administered encounter medications on file as of 11/16/2019.     Objective:   Goals Addressed              This Visit's Progress     Patient Stated   .  "I can't afford my medications" (pt-stated)        CARE PLAN ENTRY (see longtitudinal plan of care for additional care plan information)   Current Barriers:  . Social, financial, and community barriers:  o Patient notes that she spoke with her sister and she did NOT file taxes for 2020, just 2019. She is  absolutely confused as to how BMS found her income as 36,000, as she only receives income from her SSI every month.  . Diabetes: CONTROLLED though sugars appear more elevated lately, most recent A1c 6.9%, complicated by Afib, HTN, HLD . Current antihyperglycemic regimen: Basaglar 28 units daily, [name] 3-8 units PRN, metformin 1000 mg BID o Avoiding GLP1 d/t GI disease (IBS) . Cardiovascular risk reduction:  o Current hypertensive regimen: amlodipine 10 mg QPM, metoprolol tartrate 25 mg BID o Current hyperlipidemia regimen: simvastatin 10 mg daily; LDL at goal <70 on last check. . Atrial Fibrillation: Eliquis 5 mg  BID . Pancreatic insufficiency: [name] 3 capsules w/ meals and 2 with snacks  Pharmacist Clinical Goal(s):  Marland Kitchen Over the next 90 days, patient with work with PharmD and primary care provider to address optimized glycemic management  Interventions: . Comprehensive medication review performed, medication list updated in electronic medical record . Inter-disciplinary care team collaboration (see longitudinal plan of care) . Will collaborate w/ CPhT to determine next steps for medication access  Patient Self Care Activities:  . Patient will check blood glucose QID, document, and provide at future appointments . Patient will take medications as prescribed . Patient will report any questions or concerns to provider   Please see past updates related to this goal by clicking on the "Past Updates" button in the selected goal          Plan:  - Will continue to collaborate w/ patient and CPhT as above  Catie Feliz Beam, PharmD, [place], CPP Clinical Pharmacist [facility] [employer] [phone]

## 2019-11-17 NOTE — Progress Notes (Signed)
I have reviewed the above note and agree. I was available to the pharmacist for consultation.  Takila Kronberg, MD 

## 2019-11-18 DIAGNOSIS — R69 Illness, unspecified: Secondary | ICD-10-CM | POA: Diagnosis not present

## 2019-11-23 ENCOUNTER — Telehealth: Payer: Self-pay | Admitting: Pharmacist

## 2019-11-23 ENCOUNTER — Ambulatory Visit: Payer: Medicare HMO | Admitting: Pharmacist

## 2019-11-23 DIAGNOSIS — Z794 Long term (current) use of insulin: Secondary | ICD-10-CM

## 2019-11-23 DIAGNOSIS — N183 Chronic kidney disease, stage 3 unspecified: Secondary | ICD-10-CM

## 2019-11-23 DIAGNOSIS — E1122 Type 2 diabetes mellitus with diabetic chronic kidney disease: Secondary | ICD-10-CM | POA: Diagnosis not present

## 2019-11-23 DIAGNOSIS — I480 Paroxysmal atrial fibrillation: Secondary | ICD-10-CM

## 2019-11-23 MED ORDER — EMPAGLIFLOZIN 10 MG PO TABS: 10.0000 mg | ORAL_TABLET | Freq: Every day | ORAL | 2 refills | Status: DC

## 2019-11-23 NOTE — Patient Instructions (Addendum)
Beth Tyler,   It was GREAT talking to you today!  Start [name] 10 mg daily. Take this medication first thing in the morning, as it works like a diuretic to cause you to pee. Decrease Basaglar to 25 units daily to reduce your risk of hypoglycemia with starting [name].   Make sure you stay well hydrated with this medication.   Call me if you have any questions or concerns!  Catie Feliz Beam, PharmD [phone]  Visit Information  Goals Addressed              This Visit's Progress     Patient Stated   .  "I want to keep staying healthy" (pt-stated)        CARE PLAN ENTRY (see longtitudinal plan of care for additional care plan information)   Current Barriers:  . Social, financial, and community barriers:  o Reports 2 family members in the hospital right now, so a bit stressed.  . Diabetes: CONTROLLED though sugars appear more elevated lately, most recent A1c 6.9%, complicated by Afib, HTN, HLD, CKD . Current antihyperglycemic regimen: Basaglar 29 units daily, [name] 10 units PRN w/ biggest meal of the day, metformin 1000 mg BID o Avoiding GLP1 d/t GI disease (IBS) . Reports 2 episodes of hypoglycemia ~4-5 am in the past 2 months . Current glucose readings: o Fastings: 120-130s o Post prandials: occasional excursions to the 200s . Cardiovascular risk reduction:  o Current hypertensive regimen: amlodipine 10 mg QPM, metoprolol tartrate 25 mg BID;  o Current hyperlipidemia regimen: simvastatin 10 mg daily; LDL at goal <70 on last check. . Atrial Fibrillation: Eliquis 5 mg BID; working on patient assistance for Eliquis, but unsure if patient is going to meet out of pocket spend criteria . Pancreatic insufficiency: [name] 3 capsules w/ meals and 2 with snacks  Pharmacist Clinical Goal(s):  Marland Kitchen Over the next 90 days, patient with work with PharmD and primary care provider to address optimized glycemic management  Interventions: . Comprehensive medication review performed,  medication list updated in electronic medical record . Inter-disciplinary care team collaboration (see longitudinal plan of care) . Discussed addition of SGLT2 given ASCVD, CKD benefits. Patient amenable. Start [name] 10 mg daily. Providing samples. Counseled to remain hydrated and on side effects. If tolerated, will pursue patient assistance from the manufacturer . Decrease Basaglar to 25 units to prevent hypoglycemia. Hold on taking [name] until we see post prandial effect of SGLT2 . Provided empathetic listening as patient discussed her worries about her family's health.  Patient Self Care Activities:  . Patient will check blood glucose BID, document, and provide at future appointments . Patient will take medications as prescribed . Patient will report any questions or concerns to provider   Please see past updates related to this goal by clicking on the "Past Updates" button in the selected goal         Print copy of patient instructions provided.   Plan:  - Scheduled f/u call in ~ 4 weeks  Catie Feliz Beam, PharmD, [place], CPP Clinical Pharmacist [facility] [employer] [phone]

## 2019-11-23 NOTE — Telephone Encounter (Signed)
Patient returned call. See CCM documentation 

## 2019-11-23 NOTE — Telephone Encounter (Signed)
°  Chronic Care Management   Note  11/23/2019 Name: Beth Tyler MRN: [id number] DOB: 12/26/1945   Attempted to contact patient for scheduled appointment for medication management support. Left HIPAA compliant message for patient to return my call at their convenience.    Plan: - If I do not hear back from the patient by end of business today, will collaborate with Care Guide to outreach to schedule follow up with me   Catie Feliz Beam, PharmD, [place], CPP Clinical Pharmacist [facility] [employer] [phone]

## 2019-11-23 NOTE — Chronic Care Management (AMB) (Signed)
Chronic Care Management   Follow Up Note   11/23/2019 Name: Beth Tyler MRN: [id number] DOB: 09/16/1945  Referred by: Beth [place], MD Reason for referral : Chronic Care Management (Medication Management)   Beth Tyler is a 74 y.o. year old female who is a primary care patient of Beth [place], MD. The CCM team was consulted for assistance with chronic disease management and care coordination needs.    Contacted patient for medication management review.   Review of patient status, including review of consultants reports, relevant laboratory and other test results, and collaboration with appropriate care team members and the patient's provider was performed as part of comprehensive patient evaluation and provision of chronic care management services.    SDOH (Social Determinants of Health) assessments performed: Yes See Care Plan activities for detailed interventions related to SDOH)  SDOH Interventions     Most Recent Value  SDOH Interventions  Financial Strain Interventions Other (Comment)  [continued medication assistance]  Stress Interventions Other (Comment)       Outpatient Encounter Medications as of 11/23/2019  Medication Sig Note  . amLODipine (NORVASC) 10 MG tablet Take 1 tablet by mouth once daily   . apixaban (ELIQUIS) 5 MG TABS tablet Take 1 tablet (5 mg total) by mouth 2 (two) times daily.   . Insulin Glargine (BASAGLAR KWIKPEN) 100 UNIT/ML SOPN Inject 0.28 mLs (28 Units total) into the skin every morning.   . insulin lispro ([name]) 100 UNIT/ML injection Inject 0.04-0.08 mLs (4-8 Units total) into the skin 3 (three) times daily with meals. 10/14/2019: ~6-8 units TID with meals   . lipase/protease/amylase ([name]) 36000 UNITS CPEP capsule Take 36,000-72,000 Units by mouth See admin instructions. Take 3 capsules by mouth three times daily with meals and take 2 capsule by mouth daily with snacks   . magnesium oxide (MAG-OX) 400 (241.3 Mg) MG tablet Take 1  tablet by mouth once daily   . metFORMIN (GLUCOPHAGE-XR) 500 MG 24 hr tablet Take 2 tablets (1,000 mg total) by mouth 2 (two) times daily.   . metoprolol tartrate (LOPRESSOR) 25 MG tablet Take 25 mg by mouth 2 (two) times daily.   Letta Pate VERIO test strip USE 1 STRIP TO CHECK GLUCOSE TWICE DAILY (Patient taking differently: 1 each by Other route 2 (two) times daily. )   . pantoprazole (PROTONIX) 40 MG tablet TAKE 1 TABLET BY MOUTH TWICE DAILY BEFORE MEAL(S)   . simvastatin (ZOCOR) 10 MG tablet TAKE 1 TABLET BY MOUTH AT BEDTIME   . tizanidine (ZANAFLEX) 2 MG capsule Take 1 capsule (2 mg total) by mouth daily.   Beth Tyler triamcinolone cream (KENALOG) 0.1 % APPLY  CREAM TOPICALLY TWICE DAILY   . acetaminophen (TYLENOL) 500 MG tablet Take 500-1,000 mg by mouth every 6 (six) hours as needed for mild pain or moderate pain.    . BD PEN NEEDLE NANO 2ND GEN 32G X 4 MM MISC USE TO INJECT INSULIN 3 TIMES DAILY   . empagliflozin ([name]) 10 MG TABS tablet Take 1 tablet (10 mg total) by mouth daily before breakfast.   . fluticasone (CUTIVATE) 0.05 % cream Apply 1 application topically as needed. Use as directed.    No facility-administered encounter medications on file as of 11/23/2019.     Objective:   Goals Addressed              This Visit's Progress     Patient Stated   .  "I want to keep staying healthy" (pt-stated)  CARE PLAN ENTRY (see longtitudinal plan of care for additional care plan information)   Current Barriers:  . Social, financial, and community barriers:  o Reports 2 family members in the hospital right now, so a bit stressed.  . Diabetes: CONTROLLED though sugars appear more elevated lately, most recent A1c 6.9%, complicated by Afib, HTN, HLD, CKD . Current antihyperglycemic regimen: Basaglar 29 units daily, [name] 10 units PRN w/ biggest meal of the day, metformin 1000 mg BID o Avoiding GLP1 d/t GI disease (IBS) . Reports 2 episodes of hypoglycemia ~4-5 am in the past  2 months . Current glucose readings: o Fastings: 120-130s o Post prandials: occasional excursions to the 200s . Cardiovascular risk reduction:  o Current hypertensive regimen: amlodipine 10 mg QPM, metoprolol tartrate 25 mg BID;  o Current hyperlipidemia regimen: simvastatin 10 mg daily; LDL at goal <70 on last check. . Atrial Fibrillation: Eliquis 5 mg BID; working on patient assistance for Eliquis, but unsure if patient is going to meet out of pocket spend criteria . Pancreatic insufficiency: [name] 3 capsules w/ meals and 2 with snacks  Pharmacist Clinical Goal(s):  Beth Tyler Over the next 90 days, patient with work with PharmD and primary care provider to address optimized glycemic management  Interventions: . Comprehensive medication review performed, medication list updated in electronic medical record . Inter-disciplinary care team collaboration (see longitudinal plan of care) . Discussed addition of SGLT2 given ASCVD, CKD benefits. Patient amenable. Start [name] 10 mg daily. Providing samples. Counseled to remain hydrated and on side effects. If tolerated, will pursue patient assistance from the manufacturer . Decrease Basaglar to 25 units to prevent hypoglycemia. Hold on taking [name] until we see post prandial effect of SGLT2 . Provided empathetic listening as patient discussed her worries about her family's health.  Patient Self Care Activities:  . Patient will check blood glucose BID, document, and provide at future appointments . Patient will take medications as prescribed . Patient will report any questions or concerns to provider   Please see past updates related to this goal by clicking on the "Past Updates" button in the selected goal          Plan:  - Scheduled f/u call in ~ 4 weeks  Beth Tyler, PharmD, [place], CPP Clinical Pharmacist [facility] [employer] [phone]   Medication Samples have been provided to the  patient.  Drug name: [name]     Strength: 10 mg       Qty: 28 tab  LOT: [id number]  Exp.Date: 06/2020

## 2019-11-24 NOTE — Progress Notes (Signed)
I have reviewed the above note and agree. I was available to the pharmacist for consultation.  Wania Longstreth, MD 

## 2019-11-26 ENCOUNTER — Other Ambulatory Visit: Payer: Self-pay | Admitting: Pharmacy Technician

## 2019-11-26 NOTE — Patient Outreach (Addendum)
[facility]) Care Management  11/26/2019  Beth Tyler 1945/10/13 [id number]  Care coordination call placed to [employer] in regards to Eliquis application.  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 Landry Mellow was able to document that information. She informed based on the updated income and the 3% OOP that has to be met, patient is still short of the requirement.  She informed if patient meets the additional amount required then that information can be faxed into BMS and the application can be re processed.  Will route note to embedded pharmacist Catie Darnelle Maffucci for assistance.  Received routed note back from embedded RPh mentioned above that informs patient will not be meet the requirement, therefore patient assistance case is being closed.  Beth Tyler, [name]  [phone]

## 2019-12-01 ENCOUNTER — Encounter: Payer: Self-pay | Admitting: Emergency Medicine

## 2019-12-01 ENCOUNTER — Other Ambulatory Visit: Payer: Self-pay

## 2019-12-01 ENCOUNTER — Ambulatory Visit
Admission: EM | Admit: 2019-12-01 | Discharge: 2019-12-01 | Disposition: A | Discharge status: Home / Self Care | Payer: Medicare HMO | Attending: Family Medicine | Admitting: Family Medicine

## 2019-12-01 DIAGNOSIS — M1990 Unspecified osteoarthritis, unspecified site: Secondary | ICD-10-CM | POA: Diagnosis not present

## 2019-12-01 DIAGNOSIS — Z8249 Family history of ischemic heart disease and other diseases of the circulatory system: Secondary | ICD-10-CM | POA: Diagnosis not present

## 2019-12-01 DIAGNOSIS — Z794 Long term (current) use of insulin: Secondary | ICD-10-CM | POA: Diagnosis not present

## 2019-12-01 DIAGNOSIS — E7800 Pure hypercholesterolemia, unspecified: Secondary | ICD-10-CM | POA: Diagnosis not present

## 2019-12-01 DIAGNOSIS — Z885 Allergy status to narcotic agent status: Secondary | ICD-10-CM | POA: Diagnosis not present

## 2019-12-01 DIAGNOSIS — Z880 Allergy status to penicillin: Secondary | ICD-10-CM | POA: Diagnosis not present

## 2019-12-01 DIAGNOSIS — Z9049 Acquired absence of other specified parts of digestive tract: Secondary | ICD-10-CM | POA: Diagnosis not present

## 2019-12-01 DIAGNOSIS — I129 Hypertensive chronic kidney disease with stage 1 through stage 4 chronic kidney disease, or unspecified chronic kidney disease: Secondary | ICD-10-CM | POA: Insufficient documentation

## 2019-12-01 DIAGNOSIS — R059 Cough, unspecified: Secondary | ICD-10-CM

## 2019-12-01 DIAGNOSIS — K589 Irritable bowel syndrome without diarrhea: Secondary | ICD-10-CM | POA: Insufficient documentation

## 2019-12-01 DIAGNOSIS — K219 Gastro-esophageal reflux disease without esophagitis: Secondary | ICD-10-CM | POA: Diagnosis not present

## 2019-12-01 DIAGNOSIS — E1140 Type 2 diabetes mellitus with diabetic neuropathy, unspecified: Secondary | ICD-10-CM | POA: Insufficient documentation

## 2019-12-01 DIAGNOSIS — Z79899 Other long term (current) drug therapy: Secondary | ICD-10-CM | POA: Insufficient documentation

## 2019-12-01 DIAGNOSIS — I480 Paroxysmal atrial fibrillation: Secondary | ICD-10-CM | POA: Insufficient documentation

## 2019-12-01 DIAGNOSIS — Z833 Family history of diabetes mellitus: Secondary | ICD-10-CM | POA: Diagnosis not present

## 2019-12-01 DIAGNOSIS — N183 Chronic kidney disease, stage 3 unspecified: Secondary | ICD-10-CM | POA: Insufficient documentation

## 2019-12-01 DIAGNOSIS — R05 Cough: Secondary | ICD-10-CM

## 2019-12-01 DIAGNOSIS — Z7901 Long term (current) use of anticoagulants: Secondary | ICD-10-CM | POA: Diagnosis not present

## 2019-12-01 DIAGNOSIS — Z20822 Contact with and (suspected) exposure to covid-19: Secondary | ICD-10-CM | POA: Insufficient documentation

## 2019-12-01 DIAGNOSIS — E1122 Type 2 diabetes mellitus with diabetic chronic kidney disease: Secondary | ICD-10-CM | POA: Diagnosis not present

## 2019-12-01 DIAGNOSIS — Z8719 Personal history of other diseases of the digestive system: Secondary | ICD-10-CM | POA: Diagnosis not present

## 2019-12-01 LAB — SARS CORONAVIRUS 2 (TAT 6-24 HRS): SARS Coronavirus 2: NEGATIVE

## 2019-12-01 MED ORDER — BENZONATATE 200 MG PO CAPS: 200.0000 mg | ORAL_CAPSULE | Freq: Three times a day (TID) | ORAL | 0 refills | Status: DC | PRN

## 2019-12-01 NOTE — Discharge Instructions (Signed)
Rest.  Medication as directed.  COVID test result will be back tomorrow.  Take care  Dr. Adriana Simas

## 2019-12-01 NOTE — ED Triage Notes (Signed)
Patient c/o cough and headache that started 3 days ago.

## 2019-12-01 NOTE — ED Provider Notes (Signed)
[facility]    CSN: [id number] Arrival date & time: 12/01/19  4562      History   Chief Complaint Chief Complaint  Patient presents with  . Cough  . Headache   HPI  74 year old female presents with cough and headache.   Patient reports 3-day history of cough and headache.  Cough is nonproductive.  Her son is also been sick.  Pain 3/10 in severity.  No relieving factors.  Denies fever.  She has previously had COVID-19.  Patient desires testing today.  No other associated symptoms.  No other complaints.  Past Medical History:  Diagnosis Date  . Anemia   . Arthritis   . B12 deficiency   . Carpal tunnel syndrome, bilateral   . COVID-19 03/18/2019  . Diabetes mellitus (HCC) 02/17/2012  . Diabetes mellitus, type II (HCC)   . Dyspnea   . Gastritis   . GERD (gastroesophageal reflux disease)   . Hypercholesterolemia   . Hypertension   . IBS (irritable bowel syndrome)   . Neuropathy    legs  . Pernicious anemia   . Vertigo    no episodes fo 9 or 10 yrs    Patient Active Problem List   Diagnosis Date Noted  . Hypercholesterolemia 08/07/2019  . Change in bowel movement 08/07/2019  . Benign essential hypertension 04/06/2019  . Stage 3 chronic kidney disease 04/06/2019  . Type 2 diabetes mellitus with diabetic chronic kidney disease (HCC) 04/06/2019  . Paroxysmal A-fib (HCC) 04/06/2019  . Back pain 05/23/2015  . Cough 02/13/2015  . Colon cancer screening 01/19/2014    Past Surgical History:  Procedure Laterality Date  . ANTERIOR VITRECTOMY Right 12/01/2017   Procedure: ANTERIOR VITRECTOMY;  Surgeon: Nevada Crane, MD;  Location: [facility];  Service: Ophthalmology;  Laterality: Right;  . BREAST BIOPSY Right 1980   neg  . BREAST SURGERY  1980   benign cyst removal  . CATARACT EXTRACTION W/PHACO Left 10/13/2017   Procedure: CATARACT EXTRACTION PHACO AND INTRAOCULAR LENS PLACEMENT (IOC)  LEFT  DIABETES;  Surgeon: Nevada Crane, MD;   Location: [facility];  Service: Ophthalmology;  Laterality: Left;  Diabetic - insulin and oral meds  . CATARACT EXTRACTION W/PHACO Right 12/01/2017   Procedure: CATARACT EXTRACTION PHACO AND INTRAOCULAR LENS PLACEMENT (IOC)  RIGHT DIABETIC;  Surgeon: Nevada Crane, MD;  Location: [facility];  Service: Ophthalmology;  Laterality: Right;  Diabetic - insulin and oral  . CHOLECYSTECTOMY  1983    OB History   No obstetric history on file.      Home Medications    Prior to Admission medications   Medication Sig Start Date End Date Taking? Authorizing Provider  acetaminophen (TYLENOL) 500 MG tablet Take 500-1,000 mg by mouth every 6 (six) hours as needed for mild pain or moderate pain.    Yes [provider]  amLODipine (NORVASC) 10 MG tablet Take 1 tablet by mouth once daily 10/04/19  Yes Dale [place]aden, MD  apixaban (ELIQUIS) 5 MG TABS tablet Take 1 tablet (5 mg total) by mouth 2 (two) times daily. 03/22/19  Yes Arnetha Courser, MD  BD PEN NEEDLE NANO 2ND GEN 32G X 4 MM MISC USE TO INJECT INSULIN 3 TIMES DAILY 11/04/19  Yes Dale [place], MD  empagliflozin ([name]) 10 MG TABS tablet Take 1 tablet (10 mg total) by mouth daily before breakfast. 11/23/19  Yes Dale [place], MD  fluticasone (CUTIVATE) 0.05 % cream Apply 1 application topically as needed.  Use as directed. 04/05/19  Yes Darlin Priestly, MD  Insulin Glargine (BASAGLAR KWIKPEN) 100 UNIT/ML SOPN Inject 0.28 mLs (28 Units total) into the skin every morning. 04/05/19  Yes Darlin Priestly, MD  insulin lispro ([name]) 100 UNIT/ML injection Inject 0.04-0.08 mLs (4-8 Units total) into the skin 3 (three) times daily with meals. 04/05/19  Yes Darlin Priestly, MD  lipase/protease/amylase ([name]) 36000 UNITS CPEP capsule Take 36,000-72,000 Units by mouth See admin instructions. Take 3 capsules by mouth three times daily with meals and take 2 capsule by mouth daily with snacks 02/08/19 02/08/20 Yes [provider]  magnesium  oxide (MAG-OX) 400 (241.3 Mg) MG tablet Take 1 tablet by mouth once daily 10/04/19  Yes Dale [place], MD  metFORMIN (GLUCOPHAGE-XR) 500 MG 24 hr tablet Take 2 tablets (1,000 mg total) by mouth 2 (two) times daily. 05/31/19  Yes Dale [place], MD  metoprolol tartrate (LOPRESSOR) 25 MG tablet Take 25 mg by mouth 2 (two) times daily.   Yes [provider]  ONETOUCH VERIO test strip USE 1 STRIP TO CHECK GLUCOSE TWICE DAILY Patient taking differently: 1 each by Other route 2 (two) times daily.  01/18/19  Yes Dale [place], MD  pantoprazole (PROTONIX) 40 MG tablet TAKE 1 TABLET BY MOUTH TWICE DAILY BEFORE MEAL(S) 10/04/19  Yes Dale [place], MD  simvastatin (ZOCOR) 10 MG tablet TAKE 1 TABLET BY MOUTH AT BEDTIME 10/04/19  Yes Dale [place], MD  tizanidine (ZANAFLEX) 2 MG capsule Take 1 capsule (2 mg total) by mouth daily. 07/08/19  Yes Dale [place], MD  triamcinolone cream (KENALOG) 0.1 % APPLY  CREAM TOPICALLY TWICE DAILY 10/04/19  Yes Dale [place], MD  benzonatate (TESSALON) 200 MG capsule Take 1 capsule (200 mg total) by mouth 3 (three) times daily as needed for cough. 12/01/19   Tommie Sams, DO    Family History Family History  Problem Relation Age of Onset  . Diabetes Father   . Kidney disease Father   . Heart disease Father   . Alcohol abuse Mother   . Dementia Mother   . Diabetes Mother   . Diabetes Other   . Heart disease Son 13       CABG  . Hemophilia Brother   . Uterine cancer Sister   . Diabetes Sister   . COPD Sister   . Breast cancer Maternal Aunt 51  . Breast cancer Daughter 7  . Stroke Daughter     Social History Social History   Tobacco Use  . Smoking status: Never Smoker  . Smokeless tobacco: Never Used  Vaping Use  . Vaping Use: Never used  Substance Use Topics  . Alcohol use: No    Alcohol/week: 0.0 standard drinks  . Drug use: No     Allergies   Hydrocodone, Hydrocodone-acetaminophen, Oxycodone-acetaminophen, Percocet  [oxycodone-acetaminophen], Demeclocycline, Other, Penicillins, and Tetracyclines & related   Review of Systems Review of Systems  Constitutional: Negative for fever.  Respiratory: Positive for cough.   Neurological: Positive for headaches.   Physical Exam Triage Vital Signs ED Triage Vitals  Enc Vitals Group     BP 12/01/19 0904 136/66     Pulse Rate 12/01/19 0904 62     Resp 12/01/19 0904 18     Temp 12/01/19 0904 98.7 F (37.1 C)     Temp Source 12/01/19 0904 Oral     SpO2 12/01/19 0904 100 %     Weight 12/01/19 0858 191 lb (86.6 kg)     Height 12/01/19 0858 5'  2" (1.575 m)     Head Circumference --      Peak Flow --      Pain Score 12/01/19 0858 3     Pain Loc --      Pain Edu? --      Excl. in GC? --    Updated Vital Signs BP 136/66 (BP Location: Right Arm)   Pulse 62   Temp 98.7 F (37.1 C) (Oral)   Resp 18   Ht 5\' 2"  (1.575 m)   Wt 86.6 kg   SpO2 100%   BMI 34.93 kg/m   Visual Acuity Right Eye Distance:   Left Eye Distance:   Bilateral Distance:    Right Eye Near:   Left Eye Near:    Bilateral Near:     Physical Exam Vitals and nursing note reviewed.  Constitutional:      General: She is not in acute distress.    Appearance: Normal appearance. She is not ill-appearing.  HENT:     Head: Normocephalic and atraumatic.  Eyes:     General:        Right eye: No discharge.        Left eye: No discharge.     Conjunctiva/sclera: Conjunctivae normal.  Cardiovascular:     Rate and Rhythm: Normal rate and regular rhythm.  Pulmonary:     Effort: Pulmonary effort is normal.     Breath sounds: Normal breath sounds. No wheezing, rhonchi or rales.  Neurological:     Mental Status: She is alert.  Psychiatric:        Mood and Affect: Mood normal.        Behavior: Behavior normal.    UC Treatments / Results  Labs (all labs ordered are listed, but only abnormal results are displayed) Labs Reviewed  SARS CORONAVIRUS 2 (TAT 6-24 HRS)     EKG   Radiology No results found.  Procedures Procedures (including critical care time)  Medications Ordered in UC Medications - No data to display  Initial Impression / Assessment and Plan / UC Course  I have reviewed the triage vital signs and the nursing notes.  Pertinent labs & imaging results that were available during my care of the patient were reviewed by me and considered in my medical decision making (see chart for details).    74 year old female presents with cough and headache.  Exam unremarkable.  Awaiting Covid test results.  Tessalon Perles for cough.  Final Clinical Impressions(s) / UC Diagnoses   Final diagnoses:  Cough     Discharge Instructions     Rest.  Medication as directed.  COVID test result will be back tomorrow.  Take care  Dr. 61    ED Prescriptions    Medication Sig Dispense Auth. Provider   benzonatate (TESSALON) 200 MG capsule Take 1 capsule (200 mg total) by mouth 3 (three) times daily as needed for cough. 30 capsule Adriana Simas, DO     PDMP not reviewed this encounter.   Tommie Sams, Tommie Sams 12/01/19 [phone]

## 2019-12-05 ENCOUNTER — Other Ambulatory Visit: Payer: Self-pay | Admitting: Internal Medicine

## 2019-12-06 ENCOUNTER — Emergency Department: Payer: Medicare HMO

## 2019-12-06 ENCOUNTER — Other Ambulatory Visit: Payer: Self-pay

## 2019-12-06 ENCOUNTER — Telehealth: Payer: Self-pay | Admitting: Internal Medicine

## 2019-12-06 ENCOUNTER — Encounter: Payer: Self-pay | Admitting: Emergency Medicine

## 2019-12-06 DIAGNOSIS — Z79899 Other long term (current) drug therapy: Secondary | ICD-10-CM | POA: Insufficient documentation

## 2019-12-06 DIAGNOSIS — I129 Hypertensive chronic kidney disease with stage 1 through stage 4 chronic kidney disease, or unspecified chronic kidney disease: Secondary | ICD-10-CM | POA: Insufficient documentation

## 2019-12-06 DIAGNOSIS — E1140 Type 2 diabetes mellitus with diabetic neuropathy, unspecified: Secondary | ICD-10-CM | POA: Diagnosis not present

## 2019-12-06 DIAGNOSIS — Z7901 Long term (current) use of anticoagulants: Secondary | ICD-10-CM | POA: Diagnosis not present

## 2019-12-06 DIAGNOSIS — Z7984 Long term (current) use of oral hypoglycemic drugs: Secondary | ICD-10-CM | POA: Insufficient documentation

## 2019-12-06 DIAGNOSIS — N183 Chronic kidney disease, stage 3 unspecified: Secondary | ICD-10-CM | POA: Insufficient documentation

## 2019-12-06 DIAGNOSIS — R05 Cough: Secondary | ICD-10-CM | POA: Insufficient documentation

## 2019-12-06 DIAGNOSIS — Z8616 Personal history of COVID-19: Secondary | ICD-10-CM | POA: Diagnosis not present

## 2019-12-06 DIAGNOSIS — I10 Essential (primary) hypertension: Secondary | ICD-10-CM | POA: Diagnosis not present

## 2019-12-06 DIAGNOSIS — R0600 Dyspnea, unspecified: Secondary | ICD-10-CM | POA: Insufficient documentation

## 2019-12-06 DIAGNOSIS — E1122 Type 2 diabetes mellitus with diabetic chronic kidney disease: Secondary | ICD-10-CM | POA: Insufficient documentation

## 2019-12-06 DIAGNOSIS — R0602 Shortness of breath: Secondary | ICD-10-CM | POA: Diagnosis not present

## 2019-12-06 LAB — CBC
HCT: 40.9 % (ref 36.0–46.0)
Hemoglobin: 13.6 g/dL (ref 12.0–15.0)
MCH: 29.2 pg (ref 26.0–34.0)
MCHC: 33.3 g/dL (ref 30.0–36.0)
MCV: 88 fL (ref 80.0–100.0)
Platelets: 312 K/uL (ref 150–400)
RBC: 4.65 MIL/uL (ref 3.87–5.11)
RDW: 12.9 % (ref 11.5–15.5)
WBC: 11.2 K/uL — ABNORMAL HIGH (ref 4.0–10.5)
nRBC: 0 % (ref 0.0–0.2)

## 2019-12-06 LAB — BASIC METABOLIC PANEL WITH GFR
Anion gap: 12 (ref 5–15)
BUN: 20 mg/dL (ref 8–23)
CO2: 25 mmol/L (ref 22–32)
Calcium: 9.4 mg/dL (ref 8.9–10.3)
Chloride: 101 mmol/L (ref 98–111)
Creatinine, Ser: 1.07 mg/dL — ABNORMAL HIGH (ref 0.44–1.00)
GFR calc Af Amer: 59 mL/min — ABNORMAL LOW
GFR calc non Af Amer: 51 mL/min — ABNORMAL LOW
Glucose, Bld: 355 mg/dL — ABNORMAL HIGH (ref 70–99)
Potassium: 4.9 mmol/L (ref 3.5–5.1)
Sodium: 138 mmol/L (ref 135–145)

## 2019-12-06 LAB — TROPONIN I (HIGH SENSITIVITY): Troponin I (High Sensitivity): 24 ng/L — ABNORMAL HIGH (ref <18)

## 2019-12-06 NOTE — Telephone Encounter (Signed)
Pt said she already went to Urgent Care and but she is not better. She is still having trouble breathing, a deep cough, and her oxygen is at 73. I transferred patient to access nurse.

## 2019-12-06 NOTE — ED Triage Notes (Signed)
Here for [facility] for 10 days.  Feels tight feeling in back across shoulder blades.  Unlabored at this time. VSS currently.  Color WNL.  Was seen at urgent care and given something for cough but reports no xray was done.  covid + last December, negative 8/25 when seen.

## 2019-12-07 ENCOUNTER — Emergency Department
Admission: EM | Admit: 2019-12-07 | Discharge: 2019-12-07 | Disposition: A | Discharge status: Home / Self Care | Payer: Medicare HMO | Attending: Emergency Medicine | Admitting: Emergency Medicine

## 2019-12-07 DIAGNOSIS — R0600 Dyspnea, unspecified: Secondary | ICD-10-CM

## 2019-12-07 DIAGNOSIS — R059 Cough, unspecified: Secondary | ICD-10-CM

## 2019-12-07 LAB — TROPONIN I (HIGH SENSITIVITY): Troponin I (High Sensitivity): 21 ng/L — ABNORMAL HIGH (ref <18)

## 2019-12-07 NOTE — ED Provider Notes (Signed)
[facility] Emergency Department Provider Note  ____________________________________________   First MD Initiated Contact with Patient 12/07/19 [phone]     (approximate)  I have reviewed the triage vital signs and the nursing notes.   HISTORY  Chief Complaint Shortness of Breath    HPI Beth Tyler is a 74 y.o. female with medical history as listed below which notably includes both interstitial lung disease and a history of COVID-19 infection in December 2020.  She also has been diagnosed with paroxysmal atrial fibrillation and still takes Eliquis.  She presents for persistent episodes of shortness of breath over the last week.  She said that it is not a consistent shortness of breath but rather she sometimes feels like she has to take deeper breaths than usual.  She denies fever, chills, sore throat, chest pain, nausea, vomiting, abdominal pain, and dysuria.  She has had some nasal congestion as well as chest congestion and a persistent cough for about a week.  She was seen at the urgent care and a COVID-19 PCR test was performed about 5 days ago and the result was negative.  She formally had a pulmonologist who retired so she is trying to get established with a new pulmonologist.  When she talk to them on the phone to set up an appointment, they recommended she go to the emergency department since she did not have a chest x-ray during her urgent care visit.   She describes her symptoms as mild, intermittent, nothing in particular makes them better or worse.  She is not short of breath with exertion.  She had an episode of hypoxemia at home but she says that she has sleep apnea as well and when she checked her pulse oximeter and saw that the value was in the 70s, she had just awakened.  As soon as she got up and moved around her oxygen level came up.  She has not been hypoxemic during the day nor with activity.  She has no history of blood clots in the legs or the  lungs.        Past Medical History:  Diagnosis Date  . Anemia   . Arthritis   . B12 deficiency   . Carpal tunnel syndrome, bilateral   . COVID-19 03/18/2019  . Diabetes mellitus (HCC) 02/17/2012  . Diabetes mellitus, type II (HCC)   . Dyspnea   . Gastritis   . GERD (gastroesophageal reflux disease)   . Hypercholesterolemia   . Hypertension   . IBS (irritable bowel syndrome)   . Neuropathy    legs  . Pernicious anemia   . Vertigo    no episodes fo 9 or 10 yrs    Patient Active Problem List   Diagnosis Date Noted  . Hypercholesterolemia 08/07/2019  . Change in bowel movement 08/07/2019  . Benign essential hypertension 04/06/2019  . Stage 3 chronic kidney disease 04/06/2019  . Type 2 diabetes mellitus with diabetic chronic kidney disease (HCC) 04/06/2019  . Paroxysmal A-fib (HCC) 04/06/2019  . Back pain 05/23/2015  . Cough 02/13/2015  . Colon cancer screening 01/19/2014    Past Surgical History:  Procedure Laterality Date  . ANTERIOR VITRECTOMY Right 12/01/2017   Procedure: ANTERIOR VITRECTOMY;  Surgeon: Nevada Crane, MD;  Location: [facility];  Service: Ophthalmology;  Laterality: Right;  . BREAST BIOPSY Right 1980   neg  . BREAST SURGERY  1980   benign cyst removal  . CATARACT EXTRACTION W/PHACO Left 10/13/2017   Procedure:  CATARACT EXTRACTION PHACO AND INTRAOCULAR LENS PLACEMENT (IOC)  LEFT  DIABETES;  Surgeon: Nevada Crane, MD;  Location: [facility];  Service: Ophthalmology;  Laterality: Left;  Diabetic - insulin and oral meds  . CATARACT EXTRACTION W/PHACO Right 12/01/2017   Procedure: CATARACT EXTRACTION PHACO AND INTRAOCULAR LENS PLACEMENT (IOC)  RIGHT DIABETIC;  Surgeon: Nevada Crane, MD;  Location: [facility];  Service: Ophthalmology;  Laterality: Right;  Diabetic - insulin and oral  . CHOLECYSTECTOMY  1983    Prior to Admission medications   Medication Sig Start Date End Date Taking? Authorizing Provider   acetaminophen (TYLENOL) 500 MG tablet Take 500-1,000 mg by mouth every 6 (six) hours as needed for mild pain or moderate pain.     [provider]  amLODipine (NORVASC) 10 MG tablet Take 1 tablet by mouth once daily 10/04/19   Dale [place], MD  apixaban (ELIQUIS) 5 MG TABS tablet Take 1 tablet (5 mg total) by mouth 2 (two) times daily. 03/22/19   Arnetha Courser, MD  BD PEN NEEDLE NANO 2ND GEN 32G X 4 MM MISC USE TO INJECT INSULIN 3 TIMES DAILY 11/04/19   Dale Luis M. [place]int[place], MD  benzonatate (TESSALON) 200 MG capsule Take 1 capsule (200 mg total) by mouth 3 (three) times daily as needed for cough. 12/01/19   Tommie Sams, DO  empagliflozin ([name]) 10 MG TABS tablet Take 1 tablet (10 mg total) by mouth daily before breakfast. 11/23/19   Dale [place], MD  fluticasone (CUTIVATE) 0.05 % cream Apply 1 application topically as needed. Use as directed. 04/05/19   Darlin Priestly, MD  Insulin Glargine (BASAGLAR KWIKPEN) 100 UNIT/ML SOPN Inject 0.28 mLs (28 Units total) into the skin every morning. 04/05/19   Darlin Priestly, MD  insulin lispro ([name]) 100 UNIT/ML injection Inject 0.04-0.08 mLs (4-8 Units total) into the skin 3 (three) times daily with meals. 04/05/19   Darlin Priestly, MD  lipase/protease/amylase ([name]) 36000 UNITS CPEP capsule Take 36,000-72,000 Units by mouth See admin instructions. Take 3 capsules by mouth three times daily with meals and take 2 capsule by mouth daily with snacks 02/08/19 02/08/20  [provider]  magnesium oxide (MAG-OX) 400 (241.3 Mg) MG tablet Take 1 tablet by mouth once daily 10/04/19   Dale [place], MD  metFORMIN (GLUCOPHAGE-XR) 500 MG 24 hr tablet Take 2 tablets (1,000 mg total) by mouth 2 (two) times daily. 05/31/19   Dale [place], MD  metoprolol tartrate (LOPRESSOR) 25 MG tablet Take 25 mg by mouth 2 (two) times daily.    [provider]  ONETOUCH VERIO test strip USE 1 STRIP TO CHECK GLUCOSE TWICE DAILY Patient taking differently: 1 each by Other  route 2 (two) times daily.  01/18/19   Dale [place], MD  pantoprazole (PROTONIX) 40 MG tablet TAKE 1 TABLET BY MOUTH TWICE DAILY BEFORE MEAL(S) 10/04/19   Dale [place], MD  simvastatin (ZOCOR) 10 MG tablet TAKE 1 TABLET BY MOUTH AT BEDTIME 10/04/19   Dale [place], MD  tizanidine (ZANAFLEX) 2 MG capsule Take 1 capsule (2 mg total) by mouth daily. 07/08/19   Dale [place], MD  triamcinolone cream (KENALOG) 0.1 % APPLY CREAM TOPICALLY TWICE DAILY 12/06/19   Dale , MD    Allergies Hydrocodone, Hydrocodone-acetaminophen, Oxycodone-acetaminophen, Percocet [oxycodone-acetaminophen], Demeclocycline, Other, Penicillins, and Tetracyclines & related  Family History  Problem Relation Age of Onset  . Diabetes Father   . Kidney disease Father   . Heart disease Father   . Alcohol abuse Mother   .  Dementia Mother   . Diabetes Mother   . Diabetes Other   . Heart disease Son 45       CABG  . Hemophilia Brother   . Uterine cancer Sister   . Diabetes Sister   . COPD Sister   . Breast cancer Maternal Aunt 51  . Breast cancer Daughter 3  . Stroke Daughter     Social History Social History   Tobacco Use  . Smoking status: Never Smoker  . Smokeless tobacco: Never Used  Vaping Use  . Vaping Use: Never used  Substance Use Topics  . Alcohol use: No    Alcohol/week: 0.0 standard drinks  . Drug use: No    Review of Systems Constitutional: No fever/chills Eyes: No visual changes. ENT: No sore throat. Cardiovascular: Denies chest pain. Respiratory: Episodes of shortness of breath. Gastrointestinal: No abdominal pain.  No nausea, no vomiting.  No diarrhea.  No constipation. Genitourinary: Negative for dysuria. Musculoskeletal: Negative for neck pain.  Negative for back pain. Integumentary: Negative for rash. Neurological: Negative for headaches, focal weakness or numbness.   ____________________________________________   PHYSICAL EXAM:  VITAL SIGNS: ED Triage Vitals   Enc Vitals Group     BP 12/06/19 1843 (!) 187/73     Pulse Rate 12/06/19 1843 85     Resp 12/06/19 1843 (!) 22     Temp 12/06/19 1843 98.8 F (37.1 C)     Temp Source 12/06/19 1843 Oral     SpO2 12/06/19 1843 96 %     Weight 12/06/19 1844 86.2 kg (190 lb)     Height 12/06/19 1844 1.575 m (5\' 2" )     Head Circumference --      Peak Flow --      Pain Score 12/06/19 1843 2     Pain Loc --      Pain Edu? --      Excl. in GC? --     Constitutional: Alert and oriented.  Eyes: Conjunctivae are normal.  Head: Atraumatic. Nose: No congestion/rhinnorhea. Mouth/Throat: Patient is wearing a mask. Neck: No stridor.  No meningeal signs.   Cardiovascular: Normal rate, regular rhythm. Good peripheral circulation. Grossly normal heart sounds. Respiratory: Normal respiratory effort.  No coarse breath sounds or wheezing.  Deep inspiration for auscultation caused her to cough. Gastrointestinal: Soft and nontender. No distention.  Musculoskeletal: No lower extremity tenderness nor edema. No gross deformities of extremities. Neurologic:  Normal speech and language. No gross focal neurologic deficits are appreciated.  Skin:  Skin is warm, dry and intact. Psychiatric: Mood and affect are normal. Speech and behavior are normal.  ____________________________________________   LABS (all labs ordered are listed, but only abnormal results are displayed)  Labs Reviewed  BASIC METABOLIC PANEL - Abnormal; Notable for the following components:      Result Value   Glucose, Bld 355 (*)    Creatinine, Ser 1.07 (*)    GFR calc non Af Amer 51 (*)    GFR calc Af Amer 59 (*)    All other components within normal limits  CBC - Abnormal; Notable for the following components:   WBC 11.2 (*)    All other components within normal limits  TROPONIN I (HIGH SENSITIVITY) - Abnormal; Notable for the following components:   Troponin I (High Sensitivity) 24 (*)    All other components within normal limits  TROPONIN  I (HIGH SENSITIVITY) - Abnormal; Notable for the following components:   Troponin I (High Sensitivity) 21 (*)  All other components within normal limits   ____________________________________________  EKG  ED ECG REPORT I, Loleta Rose, the attending physician, personally viewed and interpreted this ECG.  Date: 12/06/2019 EKG Time: 18:44 Rate: 81 Rhythm: normal sinus rhythm QRS Axis: normal Intervals: normal ST/T Wave abnormalities: Non-specific ST segment / T-wave changes, but no clear evidence of acute ischemia. Narrative Interpretation: no definitive evidence of acute ischemia; does not meet STEMI criteria.   ____________________________________________  RADIOLOGY I, Loleta Rose, personally viewed and evaluated these images (plain radiographs) as part of my medical decision making, as well as reviewing the written report by the radiologist.  ED MD interpretation: No acute abnormality identified on chest x-ray  Official radiology report(s): DG Chest 2 View  Result Date: 12/06/2019 CLINICAL DATA:  Shortness of breath for 10 days, interscapular back tightness EXAM: CHEST - 2 VIEW COMPARISON:  05/24/2019 FINDINGS: Frontal and lateral views of the chest demonstrate an unremarkable cardiac silhouette. No airspace disease, effusion, or pneumothorax. No acute bony abnormalities. IMPRESSION: 1. No acute intrathoracic process. Electronically Signed   By: Sharlet Salina M.D.   On: 12/06/2019 19:43    ____________________________________________   PROCEDURES   Procedure(s) performed (including Critical Care):  Procedures   ____________________________________________   INITIAL IMPRESSION / MDM / ASSESSMENT AND PLAN / ED COURSE  As part of my medical decision making, I reviewed the following data within the electronic MEDICAL RECORD NUMBER Nursing notes reviewed and incorporated, Labs reviewed , EKG interpreted , Old chart reviewed, Radiograph reviewed  and Notes from prior ED  visits   Differential diagnosis includes, but is not limited to, interstitial lung disease, community-acquired pneumonia, COVID-19, other viral respiratory infection, allergies.  Patient is well-appearing and in no distress.  Vital signs have been stable through almost 12 hours in the emergency department waiting room due to overwhelming ED and hospital patient volume.  She has not been hypoxemic at all and is not tachycardic.  She has no persistent respiratory distress, just episodes where she feels like she needs to take a deep breath which causes her to cough.  That, combined with some nasal congestion/runny nose, suggest to me that she has a mild respiratory viral infection.  She had a negative Covid PCR test within the last week and she has not been having any other Covid signs or symptoms.  She has received 1 dose of the maternal vaccination has plans to get the second.  Her lab work is essentially normal other than hyperglycemia and she had 2 slightly elevated troponins but they were trending down from 26-21.  Additionally, she has had a very mildly and similarly elevated troponin in the past.  She has had no chest pain at any point and is low risk for ACS and there is no indication for chest pain obs.  When I went to see her, I was originally thinking I would obtain a CTA chest to rule out pulmonary embolism.  However, given the mild symptoms, more appropriate alternative diagnosis of mild respiratory infection, no tachycardia, no hypoxemia, no tachypnea, and the fact that she is anticoagulated on Eliquis, I do not think that it is necessary imaging.  I went over all of this with her including my reasoning and she understands and agrees with the plan for close outpatient follow-up.  I gave my usual and customary return precautions.           ____________________________________________  FINAL CLINICAL IMPRESSION(S) / ED DIAGNOSES  Final diagnoses:  Cough  Dyspnea, unspecified type  MEDICATIONS GIVEN DURING THIS VISIT:  Medications - No data to display   ED Discharge Orders    None      *Please note:  Louie BostonMickie M Thilges was evaluated in Emergency Department on 12/07/2019 for the symptoms described in the history of present illness. She was evaluated in the context of the global COVID-19 pandemic, which necessitated consideration that the patient might be at risk for infection with the SARS-CoV-2 virus that causes COVID-19. Institutional protocols and algorithms that pertain to the evaluation of patients at risk for COVID-19 are in a state of rapid change based on information released by regulatory bodies including the CDC and federal and state organizations. These policies and algorithms were followed during the patient's care in the ED.  Some ED evaluations and interventions may be delayed as a result of limited staffing during and after the pandemic.*  Note:  This document was prepared using Dragon voice recognition software and may include unintentional dictation errors.   Loleta RoseForbach, Josemaria Brining, MD 12/07/19 [phone]

## 2019-12-07 NOTE — Telephone Encounter (Signed)
Caller states that she has cough,congestion, and trouble taking a deep breath. States that her o2 sats are going up and down. States sick for 10 days. States went to [facility] on 8/25.      [facility] - [place] TELEPHONE ADVICE RECORD Access[employer]urse Patient Name: Beth Tyler Gender: Female DOB: April 30, 1945 Age: 74 Y 2 M 12 D Return Phone Number: [phone] (Primary) Address: [place] [address] Client [facility] - [place] Client Site [facility] - Day Physician Dale Ahmeek - MD Contact Type Call Who Is Calling Patient / Member / Family / Caregiver Call Type Triage / Clinical Relationship To Patient Self Return Phone Number [phone] (Primary) Chief Complaint BREATHING - fast, heavy or wheezing Reason for Call Symptomatic / Request for Health Information Initial Comment Office is transferring pt who has already been to [facility] on 8/25. She has congestion, deep cough and trouble getting a full breath. Office said her O2 level was 73 then caller clarified that it is 97 but was 77 the other night. Sxs for 10 days now. GOTO Facility Not Listed [facility] Translation No Nurse Assessment Nurse: Michell Heinrich, RN, Lanora Manis Date/Time (Eastern Time): 12/06/2019 5:04:35 PM Confirm and document reason for call. If symptomatic, describe symptoms. ---Caller states that she has cough,congestion, and trouble taking a deep breath. States that her o2 sats are going up and down. States sick for 10 days. States went to [facility] on 8/25. Has the patient had close contact with a person known or suspected to have the novel coronavirus illness OR traveled / lives in area with major community spread (including international travel) in the last 14 days from the onset of symptoms? * If Asymptomatic, screen for exposure and travel within the last 14 days. ---No Does the patient have any new or worsening  symptoms? ---Yes Will a triage be completed? ---Yes Related visit to physician within the last 2 weeks? ---Yes Does the PT have any chronic conditions? (i.e. diabetes, asthma, this includes High risk factors for pregnancy, etc.) ---Yes List chronic conditions. 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 ---No PLEASE NOTE: All timestamps contained within this report are represented as [place] Standard Time. CONFIDENTIALTY NOTICE: This fax transmission is intended only for the addressee. It contains information that is legally privileged, confidential or otherwise protected from use or disclosure. If you are not the intended recipient, you are strictly prohibited from reviewing, disclosing, copying using or disseminating any of this information or taking any action in reliance on or regarding this information. If you have received this fax in error, please notify us immediately by telephone so that we can arrange for its return to [place]. Phone: [phone], Toll-Free: [phone], Fax: [phone] Page: 2 of 2 Call Id: [id number] Guidelines Guideline Title Affirmed Question Affirmed Notes Nurse Date/Time Lamount Cohen Time) COVID-19 - Diagnosed or Suspected MODERATE difficulty breathing (e.g., speaks in phrases, SOB even at rest, pulse 100-120) Cantrell, RN, Lanora Manis 12/06/2019 5:07:32 PM Disp. Time Lamount Cohen Time) Disposition Final User 12/06/2019 5:02:32 PM Send to Urgent Ellin Goodie 12/06/2019 5:10:40 PM Go to ED Now Yes Cantrell, RN, Heloise Purpura Disagree/Comply Comply Caller Understands Yes PreDisposition InappropriateToAsk Care Advice Given Per Guideline GO TO ED NOW: * You need to be seen in the Emergency Department. * Leave now. Drive carefully. ANOTHER ADULT SHOULD DRIVE: * It is better and safer if another adult drives instead  of you. CALL EMS 911 IF: * Severe difficulty breathing  occurs * Lips or face turns blue * Confusion occurs. CARE ADVICE given per COVID-19 - DIAGNOSED OR SUSPECTED (Adult) guideline. WEAR A MASK - COVER YOUR MOUTH AND NOSE: * Wear a mask. YOU SHOULD TELL HEALTHCARE PERSONNEL THAT YOU MIGHT HAVE COVID-19: Referrals GO TO FACILITY OTHER - SPECIFY

## 2019-12-07 NOTE — Telephone Encounter (Signed)
Agree with need for evaluation.  

## 2019-12-07 NOTE — Discharge Instructions (Signed)
As we discussed, your work-up was reassuring tonight including your vital signs, chest x-ray, and lab work.  I recommend that you follow-up with your regular doctor and continue to work on getting into see a new pulmonologist.  Please return to the emergency department with any new or worsening symptoms that concern you.

## 2019-12-07 NOTE — Telephone Encounter (Signed)
Pt seen in ED 

## 2019-12-14 ENCOUNTER — Ambulatory Visit: Payer: Medicare HMO | Admitting: Pharmacist

## 2019-12-14 ENCOUNTER — Telehealth: Payer: Self-pay | Admitting: Pharmacist

## 2019-12-14 DIAGNOSIS — R059 Cough, unspecified: Secondary | ICD-10-CM

## 2019-12-14 DIAGNOSIS — Z794 Long term (current) use of insulin: Secondary | ICD-10-CM

## 2019-12-14 DIAGNOSIS — E1140 Type 2 diabetes mellitus with diabetic neuropathy, unspecified: Secondary | ICD-10-CM

## 2019-12-14 MED ORDER — BASAGLAR KWIKPEN 100 UNIT/ML ~~LOC~~ SOPN: 22.0000 [IU] | PEN_INJECTOR | Freq: Every morning | SUBCUTANEOUS | Status: DC

## 2019-12-14 MED ORDER — EMPAGLIFLOZIN 25 MG PO TABS: 25.0000 mg | ORAL_TABLET | Freq: Every day | ORAL | 2 refills | Status: DC

## 2019-12-14 NOTE — Chronic Care Management (AMB) (Signed)
Chronic Care Management   Follow Up Note   12/14/2019 Name: Beth Tyler MRN: [id number] DOB: 11-28-1945  Referred by: Dale [place], MD Reason for referral : Chronic Care Management (Medication Management)   Beth Tyler is a 74 y.o. year old female who is a primary care patient of Dale [place], MD. The CCM team was consulted for assistance with chronic disease management and care coordination needs.    Contacted patient for medication management review.   Review of patient status, including review of consultants reports, relevant laboratory and other test results, and collaboration with appropriate care team members and the patient's provider was performed as part of comprehensive patient evaluation and provision of chronic care management services.    SDOH (Social Determinants of Health) assessments performed: Yes See Care Plan activities for detailed interventions related to SDOH)  SDOH Interventions     Most Recent Value  SDOH Interventions  Financial Strain Interventions Other (Comment)  [manufacturer assistance]       Outpatient Encounter Medications as of 12/14/2019  Medication Sig Note   acetaminophen (TYLENOL) 500 MG tablet Take 500-1,000 mg by mouth every 6 (six) hours as needed for mild pain or moderate pain.     amLODipine (NORVASC) 10 MG tablet Take 1 tablet by mouth once daily    apixaban (ELIQUIS) 5 MG TABS tablet Take 1 tablet (5 mg total) by mouth 2 (two) times daily.    BD PEN NEEDLE NANO 2ND GEN 32G X 4 MM MISC USE TO INJECT INSULIN 3 TIMES DAILY    benzonatate (TESSALON) 200 MG capsule Take 1 capsule (200 mg total) by mouth 3 (three) times daily as needed for cough.    empagliflozin ([name]) 10 MG TABS tablet Take 1 tablet (10 mg total) by mouth daily before breakfast.    fluticasone (CUTIVATE) 0.05 % cream Apply 1 application topically as needed. Use as directed.    Insulin Glargine (BASAGLAR KWIKPEN) 100 UNIT/ML SOPN Inject 0.28 mLs (28  Units total) into the skin every morning.    insulin lispro ([name]) 100 UNIT/ML injection Inject 0.04-0.08 mLs (4-8 Units total) into the skin 3 (three) times daily with meals. 10/14/2019: ~6-8 units TID with meals    lipase/protease/amylase ([name]) 36000 UNITS CPEP capsule Take 36,000-72,000 Units by mouth See admin instructions. Take 3 capsules by mouth three times daily with meals and take 2 capsule by mouth daily with snacks    magnesium oxide (MAG-OX) 400 (241.3 Mg) MG tablet Take 1 tablet by mouth once daily    metFORMIN (GLUCOPHAGE-XR) 500 MG 24 hr tablet Take 2 tablets (1,000 mg total) by mouth 2 (two) times daily.    metoprolol tartrate (LOPRESSOR) 25 MG tablet Take 25 mg by mouth 2 (two) times daily.    ONETOUCH VERIO test strip USE 1 STRIP TO CHECK GLUCOSE TWICE DAILY (Patient taking differently: 1 each by Other route 2 (two) times daily. )    pantoprazole (PROTONIX) 40 MG tablet TAKE 1 TABLET BY MOUTH TWICE DAILY BEFORE MEAL(S)    simvastatin (ZOCOR) 10 MG tablet TAKE 1 TABLET BY MOUTH AT BEDTIME    tizanidine (ZANAFLEX) 2 MG capsule Take 1 capsule (2 mg total) by mouth daily.    triamcinolone cream (KENALOG) 0.1 % APPLY CREAM TOPICALLY TWICE DAILY    No facility-administered encounter medications on file as of 12/14/2019.     Objective:   Goals Addressed              This Visit's Progress  Patient Stated     "I want to keep staying healthy" (pt-stated)        CARE PLAN ENTRY (see longtitudinal plan of care for additional care plan information)   Current Barriers:   Social, financial, and community barriers:  o Reports 2 ED visits since our last call. Productive cough, SOB, but tested negative for COVID both times. Using Robitussin DM with benefit, no benefit w/ benzonatate.  o Is researching other pulmonologists to establish with. Wonders about any recommendations Dr. Lorin Picket has. Notes previous pulmonologist had diagnosed w/ interstitial lung disease, wonders  if this has flared up. o Notes that pulmonologist had prescribed PRN albuterol HFA years ago, but her prescription has expired.  Diabetes: CONTROLLED, most recent A1c 6.9%, complicated by Afib, HTN, HLD, CKD  Current antihyperglycemic regimen: Basaglar 25 units daily, metformin 1000 mg BID, [name] 10 mg daily;  [name] 10 units PRN w/ biggest meal of the day- holding since starting [name] o Avoiding GLP1 d/t GI disease (IBS)  Current glucose readings:  o Fastings: ~100s o Midday: not eating as much recently, so hasn't been checking sugars o Evening: ~180-200s   Cardiovascular risk reduction:  o Current hypertensive regimen: amlodipine 10 mg QPM, metoprolol tartrate 25 mg BID; BP at goal at last office visit o Current hyperlipidemia regimen: simvastatin 10 mg daily; LDL at goal <70 on last check.  Atrial Fibrillation: Eliquis 5 mg BID; patient will not meet out of pocket spend criteria for assistance this year  Pancreatic insufficiency: [name] 3 capsules w/ meals and 2 with snacks o APPROVED for [name] assistance through manufacturer.   Pharmacist Clinical Goal(s):   Over the next 90 days, patient with work with PharmD and primary care provider to address optimized glycemic management  Interventions:  Comprehensive medication review performed, medication list updated in electronic medical record  Inter-disciplinary care team collaboration (see longitudinal plan of care)  Discussed reports of SOB/cough. Consider PRN albuterol, especially as patient reports that cough is keeping her up at night, at least until full evaluation w/ pulmonologist. Will discuss w/ PCP  Will discuss patient's request for referral to  pulmonologist w/ Dr. Lorin Picket  Reviewed elevated post prandials. Increase [name] to 25 mg daily. Reduce Basaglar to 22 units daily to reduce risk of hypoglycemia. Continue metformin 1000 mg BID. Patient counseled to reduce Basaglar to 20 units if she develops  hypoglycemia  Reviewed income requirements for [name] assistance program; patient meets requirements. Will provide w/ samples in the interim. Patient will come by the office to sign patient assistance application and pick up samples. Will collaborate w/ Dr. Lorin Picket for provider signature  Patient Self Care Activities:   Patient will check blood glucose BID, document, and provide at future appointments  Patient will take medications as prescribed  Patient will report any questions or concerns to provider   Please see past updates related to this goal by clicking on the "Past Updates" button in the selected goal          Plan:  - Scheduled f/u call in ~ 6 weeks  Catie Feliz Beam, PharmD, [place], CPP Clinical Pharmacist [facility] [employer] [phone]

## 2019-12-14 NOTE — Telephone Encounter (Signed)
Medication Samples have been logged and labeled for the patient.  Drug name: [name]       Strength: 25 mg     Qty: 4 boxes (28 tabs)  LOT: [id number]  Exp.Date: 09/2021   Samples placed up front WITH copy of patient assistance application for [name] that requires patient's signature

## 2019-12-14 NOTE — Patient Instructions (Signed)
Visit Information  Goals Addressed              This Visit's Progress     Patient Stated   .  "I want to keep staying healthy" (pt-stated)        CARE PLAN ENTRY (see longtitudinal plan of care for additional care plan information)   Current Barriers:  . Social, financial, and community barriers:  o Reports 2 ED visits since our last call. Productive cough, SOB, but tested negative for COVID both times. Using Robitussin DM with benefit, no benefit w/ benzonatate.  o Is researching other pulmonologists to establish with. Wonders about any recommendations Dr. Lorin Picket has. Notes previous pulmonologist had diagnosed w/ interstitial lung disease, wonders if this has flared up. o Notes that pulmonologist had prescribed PRN albuterol HFA years ago, but her prescription has expired. . Diabetes: CONTROLLED, most recent A1c 6.9%, complicated by Afib, HTN, HLD, CKD . Current antihyperglycemic regimen: Basaglar 25 units daily, metformin 1000 mg BID, [name] 10 mg daily;  [name] 10 units PRN w/ biggest meal of the day- holding since starting [name] o Avoiding GLP1 d/t GI disease (IBS) . Current glucose readings:  o Fastings: ~100s o Midday: not eating as much recently, so hasn't been checking sugars o Evening: ~180-200s  . Cardiovascular risk reduction:  o Current hypertensive regimen: amlodipine 10 mg QPM, metoprolol tartrate 25 mg BID; BP at goal at last office visit o Current hyperlipidemia regimen: simvastatin 10 mg daily; LDL at goal <70 on last check. . Atrial Fibrillation: Eliquis 5 mg BID; patient will not meet out of pocket spend criteria for assistance this year . Pancreatic insufficiency: [name] 3 capsules w/ meals and 2 with snacks o APPROVED for [name] assistance through manufacturer.   Pharmacist Clinical Goal(s):  Marland Kitchen Over the next 90 days, patient with work with PharmD and primary care provider to address optimized glycemic management  Interventions: . Comprehensive  medication review performed, medication list updated in electronic medical record . Inter-disciplinary care team collaboration (see longitudinal plan of care) . Discussed reports of SOB/cough. Consider PRN albuterol, especially as patient reports that cough is keeping her up at night, at least until full evaluation w/ pulmonologist. Will discuss w/ PCP . Will discuss patient's request for referral to  pulmonologist w/ Dr. Lorin Picket . Reviewed elevated post prandials. Increase [name] to 25 mg daily. Reduce Basaglar to 22 units daily to reduce risk of hypoglycemia. Continue metformin 1000 mg BID. Patient counseled to reduce Basaglar to 20 units if she develops hypoglycemia . Reviewed income requirements for [name] assistance program; patient meets requirements. Will provide w/ samples in the interim. Patient will come by the office to sign patient assistance application and pick up samples. Will collaborate w/ Dr. Lorin Picket for provider signature  Patient Self Care Activities:  . Patient will check blood glucose BID, document, and provide at future appointments . Patient will take medications as prescribed . Patient will report any questions or concerns to provider   Please see past updates related to this goal by clicking on the "Past Updates" button in the selected goal         The patient verbalized understanding of instructions provided today and declined a print copy of patient instruction materials.   Plan:  - Scheduled f/u call in ~ 6 weeks  Catie Feliz Beam, PharmD, [place], CPP Clinical Pharmacist [facility] [employer] [phone]

## 2019-12-15 ENCOUNTER — Telehealth: Payer: Self-pay | Admitting: Internal Medicine

## 2019-12-15 DIAGNOSIS — R0602 Shortness of breath: Secondary | ICD-10-CM

## 2019-12-15 DIAGNOSIS — R059 Cough, unspecified: Secondary | ICD-10-CM

## 2019-12-15 NOTE — Progress Notes (Signed)
I have reviewed the above note and agree. I was available to the pharmacist for consultation.  Will have nurse call pt and triage - given persistent sob and cough.  Previously saw Dr Ardyth Man (pulmonary).  Will arrange f/u with another pulmonologist.    Dale Harvey, MD

## 2019-12-15 NOTE — Telephone Encounter (Signed)
Received note from Catie that pt was having some persistent cough and sob.  Please triage to confirm symptoms and if any acute change in symptoms.  Also, she requested pulmonary f/u.  Her pulmonologist is no longer in town.  Expressed desire for f/u with another pulmonologist.  Does she have a preference?

## 2019-12-17 NOTE — Telephone Encounter (Signed)
Patient confirmed no acute worsening symptoms. Having lingering cough and SOB. Would like to be referred to [facility] so they can see her in [facility]

## 2019-12-18 NOTE — Telephone Encounter (Signed)
I have placed the order for the referral. Someone should be contacting her with an appt date and time.  Does she feel she needs to see me or need anything earlier?

## 2019-12-18 NOTE — Addendum Note (Signed)
Addended by: Charm Barges on: 12/18/2019 06:07 AM   Modules accepted: Orders

## 2019-12-22 NOTE — Telephone Encounter (Signed)
Patient picked up samples. Will bring back patient assistance form with signature today.

## 2019-12-22 NOTE — Telephone Encounter (Signed)
Called and spoke [place]. Beth Tyler states that she does not need anything nor needs to move her appointment up any sooner. Beth Tyler spoke to Catie and states that her medicine was fixed and she is feeling much better.

## 2019-12-28 DIAGNOSIS — I10 Essential (primary) hypertension: Secondary | ICD-10-CM | POA: Diagnosis not present

## 2019-12-28 DIAGNOSIS — E7800 Pure hypercholesterolemia, unspecified: Secondary | ICD-10-CM | POA: Diagnosis not present

## 2019-12-28 DIAGNOSIS — I480 Paroxysmal atrial fibrillation: Secondary | ICD-10-CM | POA: Diagnosis not present

## 2019-12-30 DIAGNOSIS — U071 COVID-19: Secondary | ICD-10-CM | POA: Diagnosis not present

## 2019-12-30 DIAGNOSIS — J22 Unspecified acute lower respiratory infection: Secondary | ICD-10-CM | POA: Diagnosis not present

## 2020-01-05 ENCOUNTER — Other Ambulatory Visit: Payer: Self-pay | Admitting: Internal Medicine

## 2020-01-07 ENCOUNTER — Ambulatory Visit: Payer: Medicare HMO | Admitting: Pharmacist

## 2020-01-07 DIAGNOSIS — I480 Paroxysmal atrial fibrillation: Secondary | ICD-10-CM

## 2020-01-07 NOTE — Patient Instructions (Signed)
Visit Information  Goals Addressed              This Visit's Progress     Patient Stated   .  "I want to keep staying healthy" (pt-stated)        CARE PLAN ENTRY (see longtitudinal plan of care for additional care plan information)   Current Barriers:  . Social, financial, and community barriers:  o Received patient and provider portion for [employer] application for [employer] . Diabetes: CONTROLLED, most recent A1c 6.9%, complicated by Afib, HTN, HLD, CKD . Current antihyperglycemic regimen: Basaglar 25 units daily, metformin 1000 mg BID, [name] 25 mg daily;  [name] 10 units PRN w/ biggest meal of the day- holding since starting [name] o Working on PAP assistance for [employer] o Avoiding GLP1 d/t GI disease (IBS) . Cardiovascular risk reduction:  o Current hypertensive regimen: amlodipine 10 mg QPM, metoprolol tartrate 25 mg BID; BP at goal at last office visit o Current hyperlipidemia regimen: simvastatin 10 mg daily; LDL at goal <70 on last check. . Atrial Fibrillation: Eliquis 5 mg BID; patient will not meet out of pocket spend criteria for assistance this year . Pancreatic insufficiency: [name] 3 capsules w/ meals and 2 with snacks o APPROVED for [name] assistance through manufacturer.   Pharmacist Clinical Goal(s):  Marland Kitchen Over the next 90 days, patient with work with PharmD and primary care provider to address optimized glycemic management  Interventions: . Submitted application for [name] assistance to [employer] on behalf of patient. Will collaborate w/ CPhT for follow up.   Patient Self Care Activities:  . Patient will check blood glucose BID, document, and provide at future appointments . Patient will take medications as prescribed . Patient will report any questions or concerns to provider   Please see past updates related to this goal by clicking on the "Past Updates" button in the selected goal         The patient verbalized understanding of instructions provided today and  declined a print copy of patient instruction materials.   Plan:  - Will outreach as previously scheduled  Catie Feliz Beam, PharmD, [place], CPP Clinical Pharmacist [facility] [employer] [phone]

## 2020-01-07 NOTE — Chronic Care Management (AMB) (Signed)
Chronic Care Management   Follow Up Note   01/07/2020 Name: Beth Tyler MRN: [id number] DOB: Aug 30, 1945  Referred by: Dale Hilliard, MD Reason for referral : Chronic Care Management (Medication Management)   Beth Tyler is a 74 y.o. year old female who is a primary care patient of Dale Springmont, MD. The CCM team was consulted for assistance with chronic disease management and care coordination needs.    Care coordination completed today  Review of patient status, including review of consultants reports, relevant laboratory and other test results, and collaboration with appropriate care team members and the patient's provider was performed as part of comprehensive patient evaluation and provision of chronic care management services.    SDOH (Social Determinants of Health) assessments performed: Yes See Care Plan activities for detailed interventions related to [facility])     Outpatient Encounter Medications as of 01/07/2020  Medication Sig Note  . acetaminophen (TYLENOL) 500 MG tablet Take 500-1,000 mg by mouth every 6 (six) hours as needed for mild pain or moderate pain.    Marland Kitchen amLODipine (NORVASC) 10 MG tablet Take 1 tablet by mouth once daily   . apixaban (ELIQUIS) 5 MG TABS tablet Take 1 tablet (5 mg total) by mouth 2 (two) times daily.   . BD PEN NEEDLE NANO 2ND GEN 32G X 4 MM MISC USE TO INJECT INSULIN 3 TIMES DAILY   . benzonatate (TESSALON) 200 MG capsule Take 1 capsule (200 mg total) by mouth 3 (three) times daily as needed for cough.   . empagliflozin ([name]) 25 MG TABS tablet Take 1 tablet (25 mg total) by mouth daily before breakfast.   . fluticasone (CUTIVATE) 0.05 % cream Apply 1 application topically as needed. Use as directed.   . Insulin Glargine (BASAGLAR KWIKPEN) 100 UNIT/ML Inject 0.22 mLs (22 Units total) into the skin every morning.   . insulin lispro ([name]) 100 UNIT/ML injection Inject 0.04-0.08 mLs (4-8 Units total) into the skin 3 (three) times  daily with meals. 10/14/2019: ~6-8 units TID with meals   . lipase/protease/amylase ([name]) 36000 UNITS CPEP capsule Take 36,000-72,000 Units by mouth See admin instructions. Take 3 capsules by mouth three times daily with meals and take 2 capsule by mouth daily with snacks   . magnesium oxide (MAG-OX) 400 (241.3 Mg) MG tablet Take 1 tablet by mouth once daily   . metFORMIN (GLUCOPHAGE-XR) 500 MG 24 hr tablet Take 2 tablets (1,000 mg total) by mouth 2 (two) times daily.   . metoprolol tartrate (LOPRESSOR) 25 MG tablet Take 25 mg by mouth 2 (two) times daily.   Letta Pate VERIO test strip USE 1 STRIP TO CHECK GLUCOSE TWICE DAILY   . pantoprazole (PROTONIX) 40 MG tablet TAKE 1 TABLET BY MOUTH TWICE DAILY BEFORE MEAL(S)   . simvastatin (ZOCOR) 10 MG tablet TAKE 1 TABLET BY MOUTH AT BEDTIME   . tizanidine (ZANAFLEX) 2 MG capsule Take 1 capsule (2 mg total) by mouth daily.   Marland Kitchen triamcinolone cream (KENALOG) 0.1 % APPLY CREAM TOPICALLY TWICE DAILY    No facility-administered encounter medications on file as of 01/07/2020.     Objective:   Goals Addressed              This Visit's Progress     Patient Stated   .  "I want to keep staying healthy" (pt-stated)        CARE PLAN ENTRY (see longtitudinal plan of care for additional care plan information)   Current Barriers:  .  Social, financial, and community barriers:  o Received patient and provider portion for [employer] application for [employer] . Diabetes: CONTROLLED, most recent A1c 6.9%, complicated by Afib, HTN, HLD, CKD . Current antihyperglycemic regimen: Basaglar 25 units daily, metformin 1000 mg BID, [name] 25 mg daily;  [name] 10 units PRN w/ biggest meal of the day- holding since starting [name] o Working on PAP assistance for [employer] o Avoiding GLP1 d/t GI disease (IBS) . Cardiovascular risk reduction:  o Current hypertensive regimen: amlodipine 10 mg QPM, metoprolol tartrate 25 mg BID; BP at goal at last office visit o Current  hyperlipidemia regimen: simvastatin 10 mg daily; LDL at goal <70 on last check. . Atrial Fibrillation: Eliquis 5 mg BID; patient will not meet out of pocket spend criteria for assistance this year . Pancreatic insufficiency: [name] 3 capsules w/ meals and 2 with snacks o APPROVED for [name] assistance through manufacturer.   Pharmacist Clinical Goal(s):  Marland Kitchen Over the next 90 days, patient with work with PharmD and primary care provider to address optimized glycemic management  Interventions: . Submitted application for [name] assistance to [employer] on behalf of patient. Will collaborate w/ CPhT for follow up.   Patient Self Care Activities:  . Patient will check blood glucose BID, document, and provide at future appointments . Patient will take medications as prescribed . Patient will report any questions or concerns to provider   Please see past updates related to this goal by clicking on the "Past Updates" button in the selected goal          Plan:  - Will outreach as previously scheduled  Catie Feliz Beam, PharmD, [place], CPP Clinical Pharmacist [facility] [employer] [phone]

## 2020-01-28 ENCOUNTER — Ambulatory Visit (INDEPENDENT_AMBULATORY_CARE_PROVIDER_SITE_OTHER): Payer: Medicare HMO

## 2020-01-28 ENCOUNTER — Other Ambulatory Visit: Payer: Self-pay

## 2020-01-28 VITALS — Ht 62.0 in | Wt 187.0 lb

## 2020-01-28 DIAGNOSIS — Z23 Encounter for immunization: Secondary | ICD-10-CM | POA: Diagnosis not present

## 2020-01-28 DIAGNOSIS — Z0000 Encounter for general adult medical examination without abnormal findings: Secondary | ICD-10-CM | POA: Diagnosis not present

## 2020-01-28 NOTE — Patient Instructions (Addendum)
[name] , Thank you for taking time to come for your Medicare Wellness Visit. I appreciate your ongoing commitment to your health goals. Please review the following plan we discussed and let me know if I can assist you in the future.   These are the goals we discussed: Goals      Patient Stated   .  "I want to keep staying healthy" (pt-stated)      CARE PLAN ENTRY (see longtitudinal plan of care for additional care plan information)   Current Barriers:  . Social, financial, and community barriers:  o Received patient and provider portion for [employer] application for [employer] . Diabetes: CONTROLLED, most recent A1c 6.9%, complicated by Afib, HTN, HLD, CKD . Current antihyperglycemic regimen: Basaglar 25 units daily, metformin 1000 mg BID, [name] 25 mg daily;  [name] 10 units PRN w/ biggest meal of the day- holding since starting [name] o Working on PAP assistance for [employer] o Avoiding GLP1 d/t GI disease (IBS) . Cardiovascular risk reduction:  o Current hypertensive regimen: amlodipine 10 mg QPM, metoprolol tartrate 25 mg BID; BP at goal at last office visit o Current hyperlipidemia regimen: simvastatin 10 mg daily; LDL at goal <70 on last check. . Atrial Fibrillation: Eliquis 5 mg BID; patient will not meet out of pocket spend criteria for assistance this year . Pancreatic insufficiency: [name] 3 capsules w/ meals and 2 with snacks o APPROVED for [name] assistance through manufacturer.   Pharmacist Clinical Goal(s):  Marland Kitchen Over the next 90 days, patient with work with PharmD and primary care provider to address optimized glycemic management  Interventions: . Submitted application for [name] assistance to [employer] on behalf of patient. Will collaborate w/ CPhT for follow up.   Patient Self Care Activities:  . Patient will check blood glucose BID, document, and provide at future appointments . Patient will take medications as prescribed . Patient will report any questions or concerns to  provider   Please see past updates related to this goal by clicking on the "Past Updates" button in the selected goal         This is a list of the screening recommended for you and due dates:  Health Maintenance  Topic Date Due  . Complete foot exam   03/04/2018  . Flu Shot  11/07/2019  . Hemoglobin A1C  01/26/2020  . COVID-19 Vaccine (2 - Moderna 2-dose series) 02/13/2020*  . Tetanus Vaccine  01/27/2021*  . Eye exam for diabetics  02/02/2020  . Mammogram  06/17/2020  . Urine Protein Check  07/26/2020  . Colon Cancer Screening  11/28/2021  . DEXA scan (bone density measurement)  Completed  .  Hepatitis C: One time screening is recommended by Center for Disease Control  (CDC) for  adults born from [birth year] through 1965.   Completed  . Pneumonia vaccines  Completed  *Topic was postponed. The date shown is not the original due date.    Immunizations Immunization History  Administered Date(s) Administered  . Fluad Quad(high Dose 65+) 12/21/2018  . Influenza Split 02/04/2012  . Influenza, High Dose Seasonal PF 12/19/2015, 12/17/2016, 01/08/2018  . Influenza,inj,Quad PF,6+ Mos 01/22/2013, 01/19/2014, 12/29/2014  . Moderna SARS-COVID-2 Vaccination 07/07/2019  . Pneumococcal Conjugate-13 02/06/2010, 06/11/2013  . Pneumococcal Polysaccharide-23 02/11/2018   Keep all routine maintenance appointments.   Nurse Visit 01/28/20 @ 10:40  Follow up CCM 02/01/20   OV 02/10/20  Advanced directives:   Conditions/risks identified: none new  Follow up in one year for your annual wellness  visit.   Preventive Care 65 Years and Older, Female Preventive care refers to lifestyle choices and visits with your health care provider that can promote health and wellness. What does preventive care include?  A yearly physical exam. This is also called an annual well check.  Dental exams once or twice a year.  Routine eye exams. Ask your health care provider how often you should have your eyes  checked.  Personal lifestyle choices, including:  Daily care of your teeth and gums.  Regular physical activity.  Eating a healthy diet.  Avoiding tobacco and drug use.  Limiting alcohol use.  Practicing safe sex.  Taking low-dose aspirin every day.  Taking vitamin and mineral supplements as recommended by your health care provider. What happens during an annual well check? The services and screenings done by your health care provider during your annual well check will depend on your age, overall health, lifestyle risk factors, and family history of disease. Counseling  Your health care provider may ask you questions about your:  Alcohol use.  Tobacco use.  Drug use.  Emotional well-being.  Home and relationship well-being.  Sexual activity.  Eating habits.  History of falls.  Memory and ability to understand (cognition).  Work and work Astronome[employer]vironment.  Reproductive health. Screening  You may have the following tests or measurements:  Height, weight, and BMI.  Blood pressure.  Lipid and cholesterol levels. These may be checked every 5 years, or more frequently if you are over 75 years old.  Skin check.  Lung cancer screening. You may have this screening every year starting at age 75 if you have a 30-pack-year history of smoking and currently smoke or have quit within the past 15 years.  Fecal occult blood test (FOBT) of the stool. You may have this test every year starting at age 75.  Flexible sigmoidoscopy or colonoscopy. You may have a sigmoidoscopy every 5 years or a colonoscopy every 10 years starting at age 850.  Hepatitis C blood test.  Hepatitis B blood test.  Sexually transmitted disease (STD) testing.  Diabetes screening. This is done by checking your blood sugar (glucose) after you have not eaten for a while (fasting). You may have this done every 1-3 years.  Bone density scan. This is done to screen for osteoporosis. You may have this done  starting at age 75.  Mammogram. This may be done every 1-2 years. Talk to your health care provider about how often you should have regular mammograms. Talk with your health care provider about your test results, treatment options, and if necessary, the need for more tests. Vaccines  Your health care provider may recommend certain vaccines, such as:  Influenza vaccine. This is recommended every year.  Tetanus, diphtheria, and acellular pertussis (Tdap, Td) vaccine. You may need a Td booster every 10 years.  Zoster vaccine. You may need this after age 75.  Pneumococcal 13-valent conjugate (PCV13) vaccine. One dose is recommended after age 75.  Pneumococcal polysaccharide (PPSV23) vaccine. One dose is recommended after age 75. Talk to your health care provider about which screenings and vaccines you need and how often you need them. This information is not intended to replace advice given to you by your health care provider. Make sure you discuss any questions you have with your health care provider. Document Released: 04/21/2015 Document Revised: 12/13/2015 Document Reviewed: 01/24/2015 Elsevier Interactive Patient Education  2017 [employer]  Fall Prevention in the Home Falls can cause injuries. They can happen to  people of all ages. There are many things you can do to make your home safe and to help prevent falls. What can I do on the outside of my home?  Regularly fix the edges of walkways and driveways and fix any cracks.  Remove anything that might make you trip as you walk through a door, such as a raised step or threshold.  Trim any bushes or trees on the path to your home.  Use bright outdoor lighting.  Clear any walking paths of anything that might make someone trip, such as rocks or tools.  Regularly check to see if handrails are loose or broken. Make sure that both sides of any steps have handrails.  Any raised decks and porches should have guardrails on the  edges.  Have any leaves, snow, or ice cleared regularly.  Use sand or salt on walking paths during winter.  Clean up any spills in your garage right away. This includes oil or grease spills. What can I do in the bathroom?  Use night lights.  Install grab bars by the toilet and in the tub and shower. Do not use towel bars as grab bars.  Use non-skid mats or decals in the tub or shower.  If you need to sit down in the shower, use a plastic, non-slip stool.  Keep the floor dry. Clean up any water that spills on the floor as soon as it happens.  Remove soap buildup in the tub or shower regularly.  Attach bath mats securely with double-sided non-slip rug tape.  Do not have throw rugs and other things on the floor that can make you trip. What can I do in the bedroom?  Use night lights.  Make sure that you have a light by your bed that is easy to reach.  Do not use any sheets or blankets that are too big for your bed. They should not hang down onto the floor.  Have a firm chair that has side arms. You can use this for support while you get dressed.  Do not have throw rugs and other things on the floor that can make you trip. What can I do in the kitchen?  Clean up any spills right away.  Avoid walking on wet floors.  Keep items that you use a lot in easy-to-reach places.  If you need to reach something above you, use a strong step stool that has a grab bar.  Keep electrical cords out of the way.  Do not use floor polish or wax that makes floors slippery. If you must use wax, use non-skid floor wax.  Do not have throw rugs and other things on the floor that can make you trip. What can I do with my stairs?  Do not leave any items on the stairs.  Make sure that there are handrails on both sides of the stairs and use them. Fix handrails that are broken or loose. Make sure that handrails are as long as the stairways.  Check any carpeting to make sure that it is firmly  attached to the stairs. Fix any carpet that is loose or worn.  Avoid having throw rugs at the top or bottom of the stairs. If you do have throw rugs, attach them to the floor with carpet tape.  Make sure that you have a light switch at the top of the stairs and the bottom of the stairs. If you do not have them, ask someone to add them for you. What else  can I do to help prevent falls?  Wear shoes that:  Do not have high heels.  Have rubber bottoms.  Are comfortable and fit you well.  Are closed at the toe. Do not wear sandals.  If you use a stepladder:  Make sure that it is fully opened. Do not climb a closed stepladder.  Make sure that both sides of the stepladder are locked into place.  Ask someone to hold it for you, if possible.  Clearly mark and make sure that you can see:  Any grab bars or handrails.  First and last steps.  Where the edge of each step is.  Use tools that help you move around (mobility aids) if they are needed. These include:  Canes.  Walkers.  Scooters.  Crutches.  Turn on the lights when you go into a dark area. Replace any light bulbs as soon as they burn out.  Set up your furniture so you have a clear path. Avoid moving your furniture around.  If any of your floors are uneven, fix them.  If there are any pets around you, be aware of where they are.  Review your medicines with your doctor. Some medicines can make you feel dizzy. This can increase your chance of falling. Ask your doctor what other things that you can do to help prevent falls. This information is not intended to replace advice given to you by your health care provider. Make sure you discuss any questions you have with your health care provider. Document Released: 01/19/2009 Document Revised: 08/31/2015 Document Reviewed: 04/29/2014 Elsevier Interactive Patient Education  2017 [employer].

## 2020-01-28 NOTE — Progress Notes (Addendum)
Subjective:   Beth Tyler is a 74 y.o. female who presents for Medicare Annual (Subsequent) preventive examination.  Review of Systems    No ROS.  Medicare Wellness Virtual Visit.   Cardiac Risk Factors include: advanced age (>73men, >65 women);diabetes mellitus     Objective:    Today's Vitals   01/28/20 0836  Weight: 187 lb (84.8 kg)  Height: 5\' 2"  (1.575 m)   Body mass index is 34.2 kg/m.  Advanced Directives 01/28/2020 12/06/2019 09/28/2019 04/28/2019 04/27/2019 04/18/2019 04/16/2019  Does Patient Have a Medical Advance Directive? Yes No No No No No No  Type of Advance Directive Living will - - - - - -  Does patient want to make changes to medical advance directive? No - Patient declined - - - - - -  Copy of 06/14/2019 in Chart? - - - - - - -  Would patient like information on creating a medical advance directive? - - - No - Patient declined No - Patient declined No - Patient declined No - Patient declined    Current Medications (verified) Outpatient Encounter Medications as of 01/28/2020  Medication Sig  . acetaminophen (TYLENOL) 500 MG tablet Take 500-1,000 mg by mouth every 6 (six) hours as needed for mild pain or moderate pain.   01/30/2020 amLODipine (NORVASC) 10 MG tablet Take 1 tablet by mouth once daily  . apixaban (ELIQUIS) 5 MG TABS tablet Take 1 tablet (5 mg total) by mouth 2 (two) times daily.  . BD PEN NEEDLE NANO 2ND GEN 32G X 4 MM MISC USE TO INJECT INSULIN 3 TIMES DAILY  . benzonatate (TESSALON) 200 MG capsule Take 1 capsule (200 mg total) by mouth 3 (three) times daily as needed for cough.  . empagliflozin ([name]) 25 MG TABS tablet Take 1 tablet (25 mg total) by mouth daily before breakfast.  . fluticasone (CUTIVATE) 0.05 % cream Apply 1 application topically as needed. Use as directed.  . Insulin Glargine (BASAGLAR KWIKPEN) 100 UNIT/ML Inject 0.22 mLs (22 Units total) into the skin every morning.  . insulin lispro ([name]) 100 UNIT/ML  injection Inject 0.04-0.08 mLs (4-8 Units total) into the skin 3 (three) times daily with meals.  . lipase/protease/amylase ([name]) 36000 UNITS CPEP capsule Take 36,000-72,000 Units by mouth See admin instructions. Take 3 capsules by mouth three times daily with meals and take 2 capsule by mouth daily with snacks  . magnesium oxide (MAG-OX) 400 (241.3 Mg) MG tablet Take 1 tablet by mouth once daily  . metFORMIN (GLUCOPHAGE-XR) 500 MG 24 hr tablet Take 2 tablets (1,000 mg total) by mouth 2 (two) times daily.  . metoprolol tartrate (LOPRESSOR) 25 MG tablet Take 25 mg by mouth 2 (two) times daily.  05-07-2006 VERIO test strip USE 1 STRIP TO CHECK GLUCOSE TWICE DAILY  . pantoprazole (PROTONIX) 40 MG tablet TAKE 1 TABLET BY MOUTH TWICE DAILY BEFORE MEAL(S)  . simvastatin (ZOCOR) 10 MG tablet TAKE 1 TABLET BY MOUTH AT BEDTIME  . tizanidine (ZANAFLEX) 2 MG capsule Take 1 capsule (2 mg total) by mouth daily.  Letta Pate triamcinolone cream (KENALOG) 0.1 % APPLY CREAM TOPICALLY TWICE DAILY   No facility-administered encounter medications on file as of 01/28/2020.    Allergies (verified) Hydrocodone, Hydrocodone-acetaminophen, Oxycodone-acetaminophen, Percocet [oxycodone-acetaminophen], Demeclocycline, Other, Penicillins, and Tetracyclines & related   History: Past Medical History:  Diagnosis Date  . Anemia   . Arthritis   . B12 deficiency   . Carpal tunnel syndrome, bilateral   .  COVID-19 03/18/2019  . Diabetes mellitus (HCC) 02/17/2012  . Diabetes mellitus, type II (HCC)   . Dyspnea   . Gastritis   . GERD (gastroesophageal reflux disease)   . Hypercholesterolemia   . Hypertension   . IBS (irritable bowel syndrome)   . Neuropathy    legs  . Pernicious anemia   . Vertigo    no episodes fo 9 or 10 yrs   Past Surgical History:  Procedure Laterality Date  . ANTERIOR VITRECTOMY Right 12/01/2017   Procedure: ANTERIOR VITRECTOMY;  Surgeon: [place] CraneKing, Bradley Mark, MD;  [facility];   Service: Ophthalmology;  Laterality: Right;  . BREAST BIOPSY Right 1980   neg  . BREAST SURGERY  1980   benign cyst removal  . CATARACT EXTRACTION W/PHACO Left 10/13/2017   Procedure: CATARACT EXTRACTION PHACO AND INTRAOCULAR LENS PLACEMENT (IOC)  LEFT  DIABETES;  Surgeon: [place] CraneKing, Bradley Mark, MD;  [facility];  Service: Ophthalmology;  Laterality: Left;  Diabetic - insulin and oral meds  . CATARACT EXTRACTION W/PHACO Right 12/01/2017   Procedure: CATARACT EXTRACTION PHACO AND INTRAOCULAR LENS PLACEMENT (IOC)  RIGHT DIABETIC;  Surgeon: [place] CraneKing, Bradley Mark, MD;  [facility];  Service: Ophthalmology;  Laterality: Right;  Diabetic - insulin and oral  . CHOLECYSTECTOMY  1983   Family History  Problem Relation Age of Onset  . Diabetes Father   . Kidney disease Father   . Heart disease Father   . Alcohol abuse Mother   . Dementia Mother   . Diabetes Mother   . Diabetes Other   . Hemophilia Brother   . Uterine cancer Sister   . Diabetes Sister   . COPD Sister   . Breast cancer Maternal Aunt 51  . Breast cancer Daughter [id number]  . Stroke Daughter   . Diabetes Son    Social History   Socioeconomic History  . Marital status: Widowed    Spouse name: Not on file  . Number of children: 4  . Years of education: Not on file  . Highest education level: Not on file  Occupational History  . Not on file  Tobacco Use  . Smoking status: Never Smoker  . Smokeless tobacco: Never Used  Vaping Use  . Vaping Use: Never used  Substance and Sexual Activity  . Alcohol use: No    Alcohol/week: 0.0 standard drinks  . Drug use: No  . Sexual activity: Not Currently  Other Topics Concern  . Not on file  Social History Narrative   Widowed. Has four children. Manager of an apartment building. No tobacco, alcohol or other drug use   Social Determinants of Health   Financial Resource Strain: Medium Risk  . Difficulty of Paying Living Expenses: Somewhat hard  Food  Insecurity: No Food Insecurity  . Worried About Programme researcher, broadcasting/film/videounning Out of Food in the Last Year: Never true  . Ran Out of Food in the Last Year: Never true  Transportation Needs: No Transportation Needs  . Lack of Transportation (Medical): No  . Lack of Transportation (Non-Medical): No  Physical Activity:   . Days of Exercise per Week: Not on file  . Minutes of Exercise per Session: Not on file  Stress: Stress Concern Present  . Feeling of Stress : Very much  Social Connections:   . Frequency of Communication with Friends and Family: Not on file  . Frequency of Social Gatherings with Friends and Family: Not on file  . Attends Religious Services: Not on file  .  Active Member of Clubs or Organizations: Not on file  . Attends Banker Meetings: Not on file  . Marital Status: Not on file    Tobacco Counseling Counseling given: Not Answered   Clinical Intake:  Pre-visit preparation completed: Yes        Diabetes: Yes (Followed by pcp)  How often do you need to have someone help you when you read instructions, pamphlets, or other written materials from your doctor or pharmacy?: 1 - Never  Interpreter Needed?: No      Activities of Daily Living In your present state of health, do you have any difficulty performing the following activities: 01/28/2020 04/28/2019  Hearing? N N  Vision? N N  Difficulty concentrating or making decisions? N N  Walking or climbing stairs? N N  Dressing or bathing? N N  Doing errands, shopping? N N  Comment - -  Quarry manager and eating ? N -  Using the Toilet? N -  In the past six months, have you accidently leaked urine? N -  Do you have problems with loss of bowel control? N -  Managing your Medications? N -  Managing your Finances? N -  Housekeeping or managing your Housekeeping? N -  Comment - -  Some recent data might be hidden    Patient Care Team: Dale Langley, MD as PCP - General (Internal Medicine) [name],  RPH-CPP as Pharmacist (Pharmacist)  Indicate any recent Medical Services you may have received from other than [facility] providers in the past year (date may be approximate).     Assessment:   This is a routine wellness examination for Beth Tyler.  I connected with Charm today by telephone and verified that I am speaking with the correct person using two identifiers. Location patient: home Location provider: work Persons participating in the virtual visit: patient, Engineer, civil (consulting).    I discussed the limitations, risks, security and privacy concerns of performing an evaluation and management service by telephone and the availability of in person appointments. The patient expressed understanding and verbally consented to this telephonic visit.    Interactive audio and video telecommunications were attempted between this provider and patient, however failed, due to patient having technical difficulties OR patient did not have access to video capability.  We continued and completed visit with audio only.  Some vital signs may be absent or patient reported.   Hearing/Vision screen  Hearing Screening   125Hz  250Hz  500Hz  1000Hz  2000Hz  3000Hz  4000Hz  6000Hz  8000Hz   Right ear:           Left ear:           Comments: Patient is able to hear conversational tones without difficulty. No issues reported.  Vision Screening Comments: Visual acuity not assessed, virtual visit.  Followed by [facility] Annual visits Cataract extraction, bilateral   Dietary issues and exercise activities discussed: Current Exercise Habits: Home exercise routine, Type of exercise: stretching (Chair/standing exercises), Time (Minutes): 10, Frequency (Times/Week): 4, Weekly Exercise (Minutes/Week): 40, Intensity: MildRegular diet Good water intake  Goal:  Walk about 30 minutes for exercises  Depression Screen PHQ 2/9 Scores 01/28/2020 03/29/2019 01/27/2019 12/09/2017 12/17/2016 04/05/2016 02/23/2016  PHQ - 2 Score 0 0 0 0 0 0 0    PHQ- 9 Score - - - - 0 - -    Fall Risk Fall Risk  01/28/2020 04/16/2019 01/27/2019 12/09/2017 12/17/2016  Falls in the past year? 0 0 0 No Yes  Number falls in past yr: 0 -  0 - 2 or more  Injury with Fall? - - - - Yes  Follow up Falls evaluation completed - - - -   Handrails in use when climbing stairs? Yes Home free of loose throw rugs in walkways, pet beds, electrical cords, etc? Yes  Adequate lighting in your home to reduce risk of falls? Yes   ASSISTIVE DEVICES UTILIZED TO PREVENT FALLS: Life alert? No   Use of a cane, walker or w/c? Yes , cane as needed.  TIMED UP AND GO: Was the test performed? No . Virtual visit.   Cognitive Function: Patient is alert and oriented x3.  Denies difficulty focusing, making decisions.    MMSE - Mini Mental State Exam 12/09/2017 04/05/2016 04/06/2015  Orientation to time 5 5 5   Orientation to Place 5 5 5   Registration 3 3 3   Attention/ Calculation 5 5 5   Recall 3 3 3   Language- name 2 objects 2 2 2   Language- repeat 1 1 1   Language- follow 3 step command 3 3 3   Language- read & follow direction 1 1 1   Write a sentence 1 1 1   Copy design 1 1 1   Total score 30 30 30      6CIT Screen 01/28/2020 01/27/2019  What Year? 0 points 0 points  What month? 0 points 0 points  What time? - 0 points  Count back from 20 - 0 points  Months in reverse 0 points 0 points  Repeat phrase - 0 points  Total Score - 0    Immunizations Immunization History  Administered Date(s) Administered  . Fluad Quad(high Dose 65+) 12/21/2018  . Influenza Split 02/04/2012  . Influenza, High Dose Seasonal PF 12/19/2015, 12/17/2016, 01/08/2018  . Influenza,inj,Quad PF,6+ Mos 01/22/2013, 01/19/2014, 12/29/2014  . Moderna SARS-COVID-2 Vaccination 07/07/2019  . Pneumococcal Conjugate-13 02/06/2010, 06/11/2013  . Pneumococcal Polysaccharide-23 02/11/2018   TDAP status: Due, Education has been provided regarding the importance of this vaccine. Advised may receive this  vaccine at local pharmacy or Health Dept. Aware to provide a copy of the vaccination record if obtained from local pharmacy or Health Dept. Verbalized acceptance and understanding. Deferred.   Influenza- plans to receive later today.   Health Maintenance Health Maintenance  Topic Date Due  . FOOT EXAM  03/04/2018  . INFLUENZA VACCINE  11/07/2019  . HEMOGLOBIN A1C  01/26/2020  . COVID-19 Vaccine (2 - Moderna 2-dose series) 02/13/2020 (Originally 08/04/2019)  . TETANUS/TDAP  01/27/2021 (Originally 09/24/1964)  . OPHTHALMOLOGY EXAM  02/02/2020  . MAMMOGRAM  06/17/2020  . URINE MICROALBUMIN  07/26/2020  . COLONOSCOPY  11/28/2021  . DEXA SCAN  Completed  . Hepatitis C Screening  Completed  . PNA vac Low Risk Adult  Completed   Dental Screening: Recommended annual dental exams for proper oral hygiene. Visits every 6 months.   Community Resource Referral / Chronic Care Management: CRR required this visit?  No   CCM required this visit?  No     Plan:   Keep all routine maintenance appointments.   Nurse Visit 01/28/20 @ 10:40  Follow up CCM 02/01/20   OV 02/10/20  I have personally reviewed and noted the following in the patient's chart:   . Medical and social history . Use of alcohol, tobacco or illicit drugs  . Current medications and supplements . Functional ability and status . Nutritional status . Physical activity . Advanced directives . List of other physicians . Hospitalizations, surgeries, and ER visits in previous 12 months . Vitals . Screenings  to include cognitive, depression, and falls . Referrals and appointments  In addition, I have reviewed and discussed with patient certain preventive protocols, quality metrics, and best practice recommendations. A written personalized care plan for preventive services as well as general preventive health recommendations were provided to patient via mychart.     Ashok Pall, LPN   16/01/9603    Reviewed  above information.  Agree with assessment and plan.    Dr Lorin Picket

## 2020-01-31 DIAGNOSIS — J22 Unspecified acute lower respiratory infection: Secondary | ICD-10-CM | POA: Diagnosis not present

## 2020-02-01 ENCOUNTER — Ambulatory Visit: Payer: Medicare HMO | Admitting: Pharmacist

## 2020-02-01 DIAGNOSIS — Z794 Long term (current) use of insulin: Secondary | ICD-10-CM

## 2020-02-01 DIAGNOSIS — N183 Chronic kidney disease, stage 3 unspecified: Secondary | ICD-10-CM

## 2020-02-01 DIAGNOSIS — E7800 Pure hypercholesterolemia, unspecified: Secondary | ICD-10-CM

## 2020-02-01 DIAGNOSIS — I480 Paroxysmal atrial fibrillation: Secondary | ICD-10-CM

## 2020-02-01 DIAGNOSIS — E1140 Type 2 diabetes mellitus with diabetic neuropathy, unspecified: Secondary | ICD-10-CM

## 2020-02-01 DIAGNOSIS — I10 Essential (primary) hypertension: Secondary | ICD-10-CM

## 2020-02-01 MED ORDER — BASAGLAR KWIKPEN 100 UNIT/ML ~~LOC~~ SOPN: 28.0000 [IU] | PEN_INJECTOR | Freq: Every morning | SUBCUTANEOUS | Status: DC

## 2020-02-01 MED ORDER — INSULIN LISPRO 100 UNIT/ML ~~LOC~~ SOLN: 4.0000 [IU] | Freq: Three times a day (TID) | SUBCUTANEOUS | Status: DC | PRN

## 2020-02-01 NOTE — Chronic Care Management (AMB) (Signed)
Chronic Care Management   Follow Up Note   02/01/2020 Name: Beth Tyler MRN: [id number] DOB: [birth year]  Referred by: Dale [place], MD Reason for referral : Chronic Care Management (Medication Management)   Beth KIMBLEY is a 74 y.o. year old female who is a primary care patient of Dale [place], MD. The CCM team was consulted for assistance with chronic disease management and care coordination needs.    Contacted patient for medication management f/u  Review of patient status, including review of consultants reports, relevant laboratory and other test results, and collaboration with appropriate care team members and the patient's provider was performed as part of comprehensive patient evaluation and provision of chronic care management services.    SDOH (Social Determinants of Health) assessments performed: Yes See Care Plan activities for detailed interventions related to SDOH)  SDOH Interventions     Most Recent Value  SDOH Interventions  Financial Strain Interventions Other (Comment)  [manufacturer assistance]       Outpatient Encounter Medications as of 02/01/2020  Medication Sig Note  . acetaminophen (TYLENOL) 500 MG tablet Take 500-1,000 mg by mouth every 6 (six) hours as needed for mild pain or moderate pain.    Marland Kitchen amLODipine (NORVASC) 10 MG tablet Take 1 tablet by mouth once daily   . apixaban (ELIQUIS) 5 MG TABS tablet Take 1 tablet (5 mg total) by mouth 2 (two) times daily.   . BD PEN NEEDLE NANO 2ND GEN 32G X 4 MM MISC USE TO INJECT INSULIN 3 TIMES DAILY   . empagliflozin ([name]) 25 MG TABS tablet Take 1 tablet (25 mg total) by mouth daily before breakfast.   . fluticasone (CUTIVATE) 0.05 % cream Apply 1 application topically as needed. Use as directed.   . Insulin Glargine (BASAGLAR KWIKPEN) 100 UNIT/ML Inject 28 Units into the skin every morning.   . lipase/protease/amylase ([name]) 36000 UNITS CPEP capsule Take 36,000-72,000 Units by mouth See admin  instructions. Take 3 capsules by mouth three times daily with meals and take 2 capsule by mouth daily with snacks   . magnesium oxide (MAG-OX) 400 (241.3 Mg) MG tablet Take 1 tablet by mouth once daily   . metFORMIN (GLUCOPHAGE-XR) 500 MG 24 hr tablet Take 2 tablets (1,000 mg total) by mouth 2 (two) times daily.   . metoprolol tartrate (LOPRESSOR) 25 MG tablet Take 25 mg by mouth 2 (two) times daily.   Letta Pate VERIO test strip USE 1 STRIP TO CHECK GLUCOSE TWICE DAILY   . pantoprazole (PROTONIX) 40 MG tablet TAKE 1 TABLET BY MOUTH TWICE DAILY BEFORE MEAL(S)   . simvastatin (ZOCOR) 10 MG tablet TAKE 1 TABLET BY MOUTH AT BEDTIME   . tizanidine (ZANAFLEX) 2 MG capsule Take 1 capsule (2 mg total) by mouth daily.   Marland Kitchen triamcinolone cream (KENALOG) 0.1 % APPLY CREAM TOPICALLY TWICE DAILY   . [DISCONTINUED] Insulin Glargine (BASAGLAR KWIKPEN) 100 UNIT/ML Inject 0.22 mLs (22 Units total) into the skin every morning. 02/01/2020: 28 units   . insulin lispro ([name]) 100 UNIT/ML injection Inject 0.04-0.08 mLs (4-8 Units total) into the skin 3 (three) times daily as needed for high blood sugar.   . [DISCONTINUED] benzonatate (TESSALON) 200 MG capsule Take 1 capsule (200 mg total) by mouth 3 (three) times daily as needed for cough.   . [DISCONTINUED] insulin lispro ([name]) 100 UNIT/ML injection Inject 0.04-0.08 mLs (4-8 Units total) into the skin 3 (three) times daily with meals. (Patient not taking: Reported on 02/01/2020) 10/14/2019: ~6-8 units  TID with meals    No facility-administered encounter medications on file as of 02/01/2020.     Objective:   Goals Addressed              This Visit's Progress     Patient Stated   .  "I want to keep staying healthy" (pt-stated)        CARE PLAN ENTRY (see longtitudinal plan of care for additional care plan information)   Current Barriers:  . Social, financial, and community barriers:  o Reports her granddaughter got married. She got a dog named  Garlan Fair, she appreciates the companionship o Pleased w/ recent visits w/ pulmonology. Pertussis vs COVID cause of lingering cough, but she is doing much better now . Diabetes: CONTROLLED, most recent A1c 6.9%, complicated by Afib, HTN, HLD, CKD . Current antihyperglycemic regimen: Basaglar 28 units daily, metformin 1000 mg BID, [name] 25 mg daily o APPROVED for assistance for Basaglar/[name] through 04/07/20 o APPROVED for assistance for [name] through 04/07/20 o Avoiding GLP1 d/t GI disease (IBS) . Denies episodes of hypoglycemia since d/c [name] and starting [name] . Current glucose readings:  o Fastings: normally 120s, 2-3 episodes of fastings in 200s o Midday 125-180s . Cardiovascular risk reduction:  o Current hypertensive regimen: amlodipine 10 mg QPM, metoprolol tartrate 25 mg BID; BP at goal at last office visit o Current hyperlipidemia regimen: simvastatin 10 mg daily; LDL at goal <70 on last check. . Atrial Fibrillation: Eliquis 5 mg BID . Pancreatic insufficiency: [name] 3 capsules w/ meals and 2 with snacks o APPROVED for [name] assistance through manufacturer through 04/07/20  Pharmacist Clinical Goal(s):  Marland Kitchen Over the next 90 days, patient with work with PharmD and primary care provider to address optimized glycemic management  Interventions: . Comprehensive medication review performed, medication list updated in electronic medical record . Inter-disciplinary care team collaboration (see longitudinal plan of care) . Praised for maintenance of safer glucose control through addition of SGLT2 and insulin minimization. Continue current regimen. If patterns of post-meal (likely post-supper) hyperglycemia develop, we could consider adding back a scheduled dose of [name].  . Discussed reapplication for patient assistance for 2022. Will collaborate w/ patient, provider, and CPhT to reapply to [employer] (Basaglar/[name]), [employer] ([name]), and [employer]  ([name])  Patient Self Care Activities:  . Patient will check blood glucose BID, document, and provide at future appointments . Patient will take medications as prescribed . Patient will report any questions or concerns to provider   Please see past updates related to this goal by clicking on the "Past Updates" button in the selected goal          Plan:  - Scheduled f/u call in ~ 6 weeks  Catie Feliz Beam, PharmD, [place], CPP Clinical Pharmacist [facility] [employer] [phone]

## 2020-02-01 NOTE — Patient Instructions (Signed)
Visit Information  Goals Addressed              This Visit's Progress     Patient Stated   .  "I want to keep staying healthy" (pt-stated)        CARE PLAN ENTRY (see longtitudinal plan of care for additional care plan information)   Current Barriers:  . Social, financial, and community barriers:  o Reports her granddaughter got married. She got a dog named Garlan Fair, she appreciates the companionship o Pleased w/ recent visits w/ pulmonology. Pertussis vs COVID cause of lingering cough, but she is doing much better now . Diabetes: CONTROLLED, most recent A1c 6.9%, complicated by Afib, HTN, HLD, CKD . Current antihyperglycemic regimen: Basaglar 28 units daily, metformin 1000 mg BID, [name] 25 mg daily o APPROVED for assistance for Basaglar/[name] through 04/07/20 o APPROVED for assistance for [name] through 04/07/20 o Avoiding GLP1 d/t GI disease (IBS) . Denies episodes of hypoglycemia since d/c [name] and starting [name] . Current glucose readings:  o Fastings: normally 120s, 2-3 episodes of fastings in 200s o Midday 125-180s . Cardiovascular risk reduction:  o Current hypertensive regimen: amlodipine 10 mg QPM, metoprolol tartrate 25 mg BID; BP at goal at last office visit o Current hyperlipidemia regimen: simvastatin 10 mg daily; LDL at goal <70 on last check. . Atrial Fibrillation: Eliquis 5 mg BID . Pancreatic insufficiency: [name] 3 capsules w/ meals and 2 with snacks o APPROVED for [name] assistance through manufacturer through 04/07/20  Pharmacist Clinical Goal(s):  Marland Kitchen Over the next 90 days, patient with work with PharmD and primary care provider to address optimized glycemic management  Interventions: . Comprehensive medication review performed, medication list updated in electronic medical record . Inter-disciplinary care team collaboration (see longitudinal plan of care) . Praised for maintenance of safer glucose control through addition of SGLT2 and insulin  minimization. Continue current regimen. If patterns of post-meal (likely post-supper) hyperglycemia develop, we could consider adding back a scheduled dose of [name].  . Discussed reapplication for patient assistance for 2022. Will collaborate w/ patient, provider, and CPhT to reapply to [employer] (Basaglar/[name]), [employer] ([name]), and [employer] ([name])  Patient Self Care Activities:  . Patient will check blood glucose BID, document, and provide at future appointments . Patient will take medications as prescribed . Patient will report any questions or concerns to provider   Please see past updates related to this goal by clicking on the "Past Updates" button in the selected goal         The patient verbalized understanding of instructions provided today and declined a print copy of patient instruction materials.    Plan:  - Scheduled f/u call in ~ 6 weeks  Catie Feliz Beam, PharmD, [place], CPP Clinical Pharmacist [facility] [employer] [phone]

## 2020-02-04 DIAGNOSIS — E119 Type 2 diabetes mellitus without complications: Secondary | ICD-10-CM | POA: Diagnosis not present

## 2020-02-04 LAB — HM DIABETES EYE EXAM

## 2020-02-07 ENCOUNTER — Other Ambulatory Visit (INDEPENDENT_AMBULATORY_CARE_PROVIDER_SITE_OTHER): Payer: Medicare HMO

## 2020-02-07 ENCOUNTER — Other Ambulatory Visit: Payer: Self-pay

## 2020-02-07 DIAGNOSIS — I10 Essential (primary) hypertension: Secondary | ICD-10-CM | POA: Diagnosis not present

## 2020-02-07 DIAGNOSIS — Z794 Long term (current) use of insulin: Secondary | ICD-10-CM

## 2020-02-07 DIAGNOSIS — E1122 Type 2 diabetes mellitus with diabetic chronic kidney disease: Secondary | ICD-10-CM

## 2020-02-07 DIAGNOSIS — E7800 Pure hypercholesterolemia, unspecified: Secondary | ICD-10-CM | POA: Diagnosis not present

## 2020-02-07 DIAGNOSIS — N183 Chronic kidney disease, stage 3 unspecified: Secondary | ICD-10-CM

## 2020-02-07 LAB — CBC WITH DIFFERENTIAL/PLATELET
Basophils Absolute: 0.1 10*3/uL (ref 0.0–0.1)
Basophils Relative: 0.7 % (ref 0.0–3.0)
Eosinophils Absolute: 0.5 10*3/uL (ref 0.0–0.7)
Eosinophils Relative: 4.5 % (ref 0.0–5.0)
HCT: 41.7 % (ref 36.0–46.0)
Hemoglobin: 13.7 g/dL (ref 12.0–15.0)
Lymphocytes Relative: 18.6 % (ref 12.0–46.0)
Lymphs Abs: 1.9 10*3/uL (ref 0.7–4.0)
MCHC: 32.9 g/dL (ref 30.0–36.0)
MCV: 87.8 fl (ref 78.0–100.0)
Monocytes Absolute: 0.8 10*3/uL (ref 0.1–1.0)
Monocytes Relative: 7.7 % (ref 3.0–12.0)
Neutro Abs: 6.9 10*3/uL (ref 1.4–7.7)
Neutrophils Relative %: 68.5 % (ref 43.0–77.0)
Platelets: 264 10*3/uL (ref 150.0–400.0)
RBC: 4.75 Mil/uL (ref 3.87–5.11)
RDW: 13.9 % (ref 11.5–15.5)
WBC: 10 10*3/uL (ref 4.0–10.5)

## 2020-02-07 LAB — BASIC METABOLIC PANEL
BUN: 23 mg/dL (ref 6–23)
CO2: 30 mEq/L (ref 19–32)
Calcium: 9.7 mg/dL (ref 8.4–10.5)
Chloride: 103 mEq/L (ref 96–112)
Creatinine, Ser: 0.96 mg/dL (ref 0.40–1.20)
GFR: 57.93 mL/min — ABNORMAL LOW (ref >60.00)
Glucose, Bld: 163 mg/dL — ABNORMAL HIGH (ref 70–99)
Potassium: 4.1 mEq/L (ref 3.5–5.1)
Sodium: 143 mEq/L (ref 135–145)

## 2020-02-07 LAB — HEPATIC FUNCTION PANEL
ALT: 15 U/L (ref 0–35)
AST: 15 U/L (ref 0–37)
Albumin: 4.5 g/dL (ref 3.5–5.2)
Alkaline Phosphatase: 90 U/L (ref 39–117)
Bilirubin, Direct: 0.2 mg/dL (ref 0.0–0.3)
Total Bilirubin: 0.7 mg/dL (ref 0.2–1.2)
Total Protein: 7.2 g/dL (ref 6.0–8.3)

## 2020-02-07 LAB — HEMOGLOBIN A1C: Hgb A1c MFr Bld: 9.5 % — ABNORMAL HIGH (ref 4.6–6.5)

## 2020-02-07 LAB — LIPID PANEL
Cholesterol: 129 mg/dL (ref 0–200)
HDL: 52 mg/dL (ref >39.00)
LDL Cholesterol: 57 mg/dL (ref 0–99)
NonHDL: 77.41
Total CHOL/HDL Ratio: 2
Triglycerides: 102 mg/dL (ref 0.0–149.0)
VLDL: 20.4 mg/dL (ref 0.0–40.0)

## 2020-02-08 ENCOUNTER — Ambulatory Visit: Payer: Medicare HMO | Admitting: Pharmacist

## 2020-02-08 DIAGNOSIS — N183 Chronic kidney disease, stage 3 unspecified: Secondary | ICD-10-CM

## 2020-02-08 DIAGNOSIS — E1140 Type 2 diabetes mellitus with diabetic neuropathy, unspecified: Secondary | ICD-10-CM

## 2020-02-08 DIAGNOSIS — I480 Paroxysmal atrial fibrillation: Secondary | ICD-10-CM

## 2020-02-08 DIAGNOSIS — Z794 Long term (current) use of insulin: Secondary | ICD-10-CM

## 2020-02-08 MED ORDER — FREESTYLE LIBRE 2 SENSOR MISC: 11 refills | Status: DC

## 2020-02-08 NOTE — Patient Instructions (Signed)
Visit Information  Goals Addressed              This Visit's Progress     Patient Stated   .  "I want to keep staying healthy" (pt-stated)        CARE PLAN ENTRY (see longtitudinal plan of care for additional care plan information)   Current Barriers:  . Social, financial, and community barriers:  o Patient calls today extremely concerned about increase in A1c.  . Diabetes: uncontrolled, most recent A1c 9.5%, complicated by Afib, HTN, HLD, CKD . Current antihyperglycemic regimen: Basaglar 28 units daily, metformin 1000 mg BID, [name] 25 mg daily o APPROVED for assistance for Basaglar/[name] through 04/07/20 o APPROVED for assistance for [name] through 04/07/20 o Avoiding GLP1 d/t GI disease (IBS) . Cardiovascular risk reduction:  o Current hypertensive regimen: amlodipine 10 mg QPM, metoprolol tartrate 25 mg BID; BP at goal at last office visit o Current hyperlipidemia regimen: simvastatin 10 mg daily; LDL at goal <70 on last check. . Atrial Fibrillation: Eliquis 5 mg BID . Pancreatic insufficiency: [name] 3 capsules w/ meals and 2 with snacks o APPROVED for [name] assistance through manufacturer through 04/07/20  Pharmacist Clinical Goal(s):  Marland Kitchen Over the next 90 days, patient with work with PharmD and primary care provider to address optimized glycemic management  Interventions: . Discussed CGM. Patient interested in [employer] 2 for $75/month. Sending scripts to pharmacy. Set up with nurse visit for sample and teaching.   Patient Self Care Activities:  . Patient will check blood glucose BID, document, and provide at future appointments . Patient will take medications as prescribed . Patient will report any questions or concerns to provider   Please see past updates related to this goal by clicking on the "Past Updates" button in the selected goal         The patient verbalized understanding of instructions provided today and declined a print copy of patient  instruction materials.   Plan:  - Will outreach as previously scheduled  Catie Feliz Beam, PharmD, [place], CPP Clinical Pharmacist [facility] [place] [employer] [phone]

## 2020-02-08 NOTE — Chronic Care Management (AMB) (Signed)
Chronic Care Management   Follow Up Note   02/08/2020 Name: Beth Tyler MRN: [id number] DOB: [birth year]  Referred by: Dale Taft, MD Reason for referral : Chronic Care Management (Medication Management)   Beth Tyler is a 74 y.o. year old female who is a primary care patient of Dale Wright, MD. The CCM team was consulted for assistance with chronic disease management and care coordination needs.    Received call from patient today regarding medication access.   Review of patient status, including review of consultants reports, relevant laboratory and other test results, and collaboration with appropriate care team members and the patient's provider was performed as part of comprehensive patient evaluation and provision of chronic care management services.    SDOH (Social Determinants of Health) assessments performed: No See Care Plan activities for detailed interventions related to [facility])     Outpatient Encounter Medications as of 02/08/2020  Medication Sig  . acetaminophen (TYLENOL) 500 MG tablet Take 500-1,000 mg by mouth every 6 (six) hours as needed for mild pain or moderate pain.   Marland Kitchen amLODipine (NORVASC) 10 MG tablet Take 1 tablet by mouth once daily  . apixaban (ELIQUIS) 5 MG TABS tablet Take 1 tablet (5 mg total) by mouth 2 (two) times daily.  . BD PEN NEEDLE NANO 2ND GEN 32G X 4 MM MISC USE TO INJECT INSULIN 3 TIMES DAILY  . empagliflozin ([name]) 25 MG TABS tablet Take 1 tablet (25 mg total) by mouth daily before breakfast.  . fluticasone (CUTIVATE) 0.05 % cream Apply 1 application topically as needed. Use as directed.  . Insulin Glargine (BASAGLAR KWIKPEN) 100 UNIT/ML Inject 28 Units into the skin every morning.  . insulin lispro ([name]) 100 UNIT/ML injection Inject 0.04-0.08 mLs (4-8 Units total) into the skin 3 (three) times daily as needed for high blood sugar.  . lipase/protease/amylase ([name]) 36000 UNITS CPEP capsule Take 36,000-72,000 Units by  mouth See admin instructions. Take 3 capsules by mouth three times daily with meals and take 2 capsule by mouth daily with snacks  . magnesium oxide (MAG-OX) 400 (241.3 Mg) MG tablet Take 1 tablet by mouth once daily  . metFORMIN (GLUCOPHAGE-XR) 500 MG 24 hr tablet Take 2 tablets (1,000 mg total) by mouth 2 (two) times daily.  . metoprolol tartrate (LOPRESSOR) 25 MG tablet Take 25 mg by mouth 2 (two) times daily.  Letta Pate VERIO test strip USE 1 STRIP TO CHECK GLUCOSE TWICE DAILY  . pantoprazole (PROTONIX) 40 MG tablet TAKE 1 TABLET BY MOUTH TWICE DAILY BEFORE MEAL(S)  . simvastatin (ZOCOR) 10 MG tablet TAKE 1 TABLET BY MOUTH AT BEDTIME  . tizanidine (ZANAFLEX) 2 MG capsule Take 1 capsule (2 mg total) by mouth daily.  Marland Kitchen triamcinolone cream (KENALOG) 0.1 % APPLY CREAM TOPICALLY TWICE DAILY   No facility-administered encounter medications on file as of 02/08/2020.     Objective:   Goals Addressed              This Visit's Progress     Patient Stated   .  "I want to keep staying healthy" (pt-stated)        CARE PLAN ENTRY (see longtitudinal plan of care for additional care plan information)   Current Barriers:  . Social, financial, and community barriers:  o Patient calls today extremely concerned about increase in A1c.  . Diabetes: uncontrolled, most recent A1c 9.5%, complicated by Afib, HTN, HLD, CKD . Current antihyperglycemic regimen: Basaglar 28 units daily, metformin 1000 mg BID,  [name] 25 mg daily o APPROVED for assistance for Basaglar/[name] through 04/07/20 o APPROVED for assistance for [name] through 04/07/20 o Avoiding GLP1 d/t GI disease (IBS) . Cardiovascular risk reduction:  o Current hypertensive regimen: amlodipine 10 mg QPM, metoprolol tartrate 25 mg BID; BP at goal at last office visit o Current hyperlipidemia regimen: simvastatin 10 mg daily; LDL at goal <70 on last check. . Atrial Fibrillation: Eliquis 5 mg BID . Pancreatic insufficiency: [name] 3  capsules w/ meals and 2 with snacks o APPROVED for [name] assistance through manufacturer through 04/07/20  Pharmacist Clinical Goal(s):  Marland Kitchen Over the next 90 days, patient with work with PharmD and primary care provider to address optimized glycemic management  Interventions: . Discussed CGM. Patient interested in [employer] 2 for $75/month. Sending scripts to pharmacy. Set up with nurse visit for sample and teaching.   Patient Self Care Activities:  . Patient will check blood glucose BID, document, and provide at future appointments . Patient will take medications as prescribed . Patient will report any questions or concerns to provider   Please see past updates related to this goal by clicking on the "Past Updates" button in the selected goal          Plan:  - Will outreach as previously scheduled  Catie Feliz Beam, PharmD, [place], CPP Clinical Pharmacist [facility] [employer] [phone]

## 2020-02-10 ENCOUNTER — Ambulatory Visit: Payer: Medicare HMO | Admitting: Internal Medicine

## 2020-02-16 ENCOUNTER — Other Ambulatory Visit: Payer: Self-pay

## 2020-02-16 ENCOUNTER — Ambulatory Visit (INDEPENDENT_AMBULATORY_CARE_PROVIDER_SITE_OTHER): Payer: Medicare HMO

## 2020-02-16 DIAGNOSIS — E1122 Type 2 diabetes mellitus with diabetic chronic kidney disease: Secondary | ICD-10-CM

## 2020-02-16 DIAGNOSIS — Z794 Long term (current) use of insulin: Secondary | ICD-10-CM

## 2020-02-16 DIAGNOSIS — N183 Chronic kidney disease, stage 3 unspecified: Secondary | ICD-10-CM

## 2020-02-16 NOTE — Progress Notes (Addendum)
Patient presented for [facility] placement and instruction. Per Providers order from 02-08-20. Patient voiced no concerns nor showed signs of distress during placement. Also instructed patient on how to record so we can have accurate monitoring system. Instructed patient that if further questions, they can call clinic to be given further direction.  Reviewed.  Dr Lorin Picket

## 2020-02-19 ENCOUNTER — Ambulatory Visit
Admission: EM | Admit: 2020-02-19 | Discharge: 2020-02-19 | Disposition: A | Discharge status: Home / Self Care | Payer: Medicare HMO | Attending: Emergency Medicine | Admitting: Emergency Medicine

## 2020-02-19 ENCOUNTER — Encounter: Payer: Self-pay | Admitting: Emergency Medicine

## 2020-02-19 ENCOUNTER — Other Ambulatory Visit: Payer: Self-pay

## 2020-02-19 DIAGNOSIS — R0982 Postnasal drip: Secondary | ICD-10-CM | POA: Diagnosis not present

## 2020-02-19 DIAGNOSIS — J029 Acute pharyngitis, unspecified: Secondary | ICD-10-CM

## 2020-02-19 LAB — GROUP A STREP BY PCR: Group A Strep by PCR: NOT DETECTED

## 2020-02-19 NOTE — ED Triage Notes (Signed)
Patient c/o sore throat that started yesterday.  Patient denies fevers.  Patient denies any other cold symptoms.

## 2020-02-19 NOTE — ED Provider Notes (Signed)
[facility]    CSN: [id number] Arrival date & time: 02/19/20  0813      History   Chief Complaint Chief Complaint  Patient presents with  . Sore Throat    HPI Beth Tyler is a 74 y.o. female presenting for 1 day history of sore throat and body aches.  Patient states that her 75 year old grandson had strep throat last week and when she woke up with a sore throat she thought she should get checked for strep.  She denies any associated other symptoms including fever, cough, congestion, headaches, sinus pain, chest pain, breathing difficulty, wheezing, abdominal pain, nausea, vomiting or diarrhea.  She has not taken any over-the-counter medication for symptoms since she said they just charted last night.  Patient does have a history of insulin-dependent type 2 diabetes, hypertension, hyperlipidemia stage III kidney disease, and paroxysmal A. fib.  She states that she had Covid last year and had symptoms for about 5 months.  She states that she started feeling better after May 2021.  Patient states that she is fully vaccinated for Covid as well.  She has no other complaints or concerns today.  HPI  Past Medical History:  Diagnosis Date  . Anemia   . Arthritis   . B12 deficiency   . Carpal tunnel syndrome, bilateral   . COVID-19 03/18/2019  . Diabetes mellitus (HCC) 02/17/2012  . Diabetes mellitus, type II (HCC)   . Dyspnea   . Gastritis   . GERD (gastroesophageal reflux disease)   . Hypercholesterolemia   . Hypertension   . IBS (irritable bowel syndrome)   . Neuropathy    legs  . Pernicious anemia   . Vertigo    no episodes fo 9 or 10 yrs    Patient Active Problem List   Diagnosis Date Noted  . Hypercholesterolemia 08/07/2019  . Change in bowel movement 08/07/2019  . Benign essential hypertension 04/06/2019  . Stage 3 chronic kidney disease (HCC) 04/06/2019  . Type 2 diabetes mellitus with diabetic chronic kidney disease (HCC) 04/06/2019  . Paroxysmal  A-fib (HCC) 04/06/2019  . Back pain 05/23/2015  . Cough 02/13/2015  . Colon cancer screening 01/19/2014    Past Surgical History:  Procedure Laterality Date  . ANTERIOR VITRECTOMY Right 12/01/2017   Procedure: ANTERIOR VITRECTOMY;  Surgeon: [place] CraneKing, Bradley Mark, MD;  [facility];  Service: Ophthalmology;  Laterality: Right;  . BREAST BIOPSY Right 1980   neg  . BREAST SURGERY  1980   benign cyst removal  . CATARACT EXTRACTION W/PHACO Left 10/13/2017   Procedure: CATARACT EXTRACTION PHACO AND INTRAOCULAR LENS PLACEMENT (IOC)  LEFT  DIABETES;  Surgeon: [place] CraneKing, Bradley Mark, MD;  [facility];  Service: Ophthalmology;  Laterality: Left;  Diabetic - insulin and oral meds  . CATARACT EXTRACTION W/PHACO Right 12/01/2017   Procedure: CATARACT EXTRACTION PHACO AND INTRAOCULAR LENS PLACEMENT (IOC)  RIGHT DIABETIC;  Surgeon: [place] CraneKing, Bradley Mark, MD;  [facility];  Service: Ophthalmology;  Laterality: Right;  Diabetic - insulin and oral  . CHOLECYSTECTOMY  1983    OB History   No obstetric history on file.      Home Medications    Prior to Admission medications   Medication Sig Start Date End Date Taking? Authorizing Provider  amLODipine (NORVASC) 10 MG tablet Take 1 tablet by mouth once daily 01/06/20  Yes Dale DurhamScott, Charlene, MD  apixaban (ELIQUIS) 5 MG TABS tablet Take 1 tablet (5 mg total) by mouth 2 (two)  times daily. 03/22/19  Yes Arnetha Courser, MD  empagliflozin ([name]) 25 MG TABS tablet Take 1 tablet (25 mg total) by mouth daily before breakfast. 12/14/19  Yes Dale [place], MD  Insulin Glargine (BASAGLAR KWIKPEN) 100 UNIT/ML Inject 28 Units into the skin every morning. 02/01/20  Yes Dale [place], MD  insulin lispro ([name]) 100 UNIT/ML injection Inject 0.04-0.08 mLs (4-8 Units total) into the skin 3 (three) times daily as needed for high blood sugar. 02/01/20  Yes Dale [place], MD  magnesium oxide (MAG-OX) 400 (241.3 Mg) MG tablet  Take 1 tablet by mouth once daily 01/06/20  Yes Dale Leggett, MD  metFORMIN (GLUCOPHAGE-XR) 500 MG 24 hr tablet Take 2 tablets (1,000 mg total) by mouth 2 (two) times daily. 05/31/19  Yes Dale McCulloch, MD  metoprolol tartrate (LOPRESSOR) 25 MG tablet Take 25 mg by mouth 2 (two) times daily.   Yes [provider]  pantoprazole (PROTONIX) 40 MG tablet TAKE 1 TABLET BY MOUTH TWICE DAILY BEFORE MEAL(S) 01/06/20  Yes Dale Pingree, MD  simvastatin (ZOCOR) 10 MG tablet TAKE 1 TABLET BY MOUTH AT BEDTIME 01/06/20  Yes Dale Chandler, MD  acetaminophen (TYLENOL) 500 MG tablet Take 500-1,000 mg by mouth every 6 (six) hours as needed for mild pain or moderate pain.     [provider]  BD PEN NEEDLE NANO 2ND GEN 32G X 4 MM MISC USE TO INJECT INSULIN 3 TIMES DAILY 11/04/19   Dale [place], MD  Continuous Blood Gluc Sensor (FREESTYLE LIBRE 2 SENSOR) MISC Use to check sugar at least 4 times daily 02/08/20   Dale Sligo, MD  fluticasone (CUTIVATE) 0.05 % cream Apply 1 application topically as needed. Use as directed. 04/05/19   Darlin Priestly, MD  [facility] VERIO test strip USE 1 STRIP TO CHECK GLUCOSE TWICE DAILY 01/06/20   Dale Wilburton, MD  tizanidine (ZANAFLEX) 2 MG capsule Take 1 capsule (2 mg total) by mouth daily. 07/08/19   Dale Barry, MD  triamcinolone cream (KENALOG) 0.1 % APPLY CREAM TOPICALLY TWICE DAILY 01/06/20   Dale Langlade, MD    Family History Family History  Problem Relation Age of Onset  . Diabetes Father   . Kidney disease Father   . Heart disease Father   . Alcohol abuse Mother   . Dementia Mother   . Diabetes Mother   . Diabetes Other   . Hemophilia Brother   . Uterine cancer Sister   . Diabetes Sister   . COPD Sister   . Breast cancer Maternal Aunt 51  . Breast cancer Daughter 24  . Stroke Daughter   . Diabetes Son     Social History Social History   Tobacco Use  . Smoking status: Never Smoker  . Smokeless tobacco: Never Used  Vaping Use  .  Vaping Use: Never used  Substance Use Topics  . Alcohol use: No    Alcohol/week: 0.0 standard drinks  . Drug use: No     Allergies   Hydrocodone, Hydrocodone-acetaminophen, Oxycodone-acetaminophen, Percocet [oxycodone-acetaminophen], Demeclocycline, Other, Penicillins, and Tetracyclines & related   Review of Systems Review of Systems  Constitutional: Negative for chills, diaphoresis, fatigue and fever.  HENT: Positive for sore throat. Negative for congestion, ear pain, rhinorrhea, sinus pressure and sinus pain.   Respiratory: Negative for cough and shortness of breath.   Gastrointestinal: Negative for abdominal pain, nausea and vomiting.  Musculoskeletal: Positive for myalgias. Negative for arthralgias.  Skin: Negative for rash.  Neurological: Negative for weakness and headaches.  Hematological: Negative for  adenopathy.     Physical Exam Triage Vital Signs ED Triage Vitals  Enc Vitals Group     BP 02/19/20 0828 (!) 142/75     Pulse Rate 02/19/20 0828 70     Resp 02/19/20 0828 14     Temp 02/19/20 0828 98.5 F (36.9 C)     Temp Source 02/19/20 0828 Oral     SpO2 02/19/20 0828 97 %     Weight 02/19/20 0824 185 lb (83.9 kg)     Height 02/19/20 0824 5\' 2"  (1.575 m)     Head Circumference --      Peak Flow --      Pain Score 02/19/20 0824 6     Pain Loc --      Pain Edu? --      Excl. in GC? --    No data found.  Updated Vital Signs BP (!) 142/75 (BP Location: Right Arm)   Pulse 70   Temp 98.5 F (36.9 C) (Oral)   Resp 14   Ht 5\' 2"  (1.575 m)   Wt 185 lb (83.9 kg)   SpO2 97%   BMI 33.84 kg/m        Physical Exam Vitals and nursing note reviewed.  Constitutional:      General: She is not in acute distress.    Appearance: Normal appearance. She is not ill-appearing or toxic-appearing.  HENT:     Head: Normocephalic and atraumatic.     Right Ear: Tympanic membrane and ear canal normal.     Left Ear: Tympanic membrane and ear canal normal.     Nose: Nose  normal. No congestion or rhinorrhea.     Mouth/Throat:     Mouth: Mucous membranes are moist.     Pharynx: Oropharynx is clear. Posterior oropharyngeal erythema (with clear PND) present.     Tonsils: 0 on the right. 0 on the left.  Eyes:     General: No scleral icterus.       Right eye: No discharge.        Left eye: No discharge.     Conjunctiva/sclera: Conjunctivae normal.  Cardiovascular:     Rate and Rhythm: Normal rate and regular rhythm.     Heart sounds: Normal heart sounds.  Pulmonary:     Effort: Pulmonary effort is normal. No respiratory distress.     Breath sounds: Normal breath sounds.  Musculoskeletal:     Cervical back: Neck supple.  Skin:    General: Skin is dry.  Neurological:     General: No focal deficit present.     Mental Status: She is alert. Mental status is at baseline.     Motor: No weakness.     Gait: Gait normal.  Psychiatric:        Mood and Affect: Mood normal.        Behavior: Behavior normal.        Thought Content: Thought content normal.      UC Treatments / Results  Labs (all labs ordered are listed, but only abnormal results are displayed) Labs Reviewed  GROUP A STREP BY PCR    EKG   Radiology No results found.  Procedures Procedures (including critical care time)  Medications Ordered in UC Medications - No data to display  Initial Impression / Assessment and Plan / UC Course  I have reviewed the triage vital signs and the nursing notes.  Pertinent labs & imaging results that were available during my care of the patient  were reviewed by me and considered in my medical decision making (see chart for details).   Molecular strep test is negative today.  On exam she has mild pharyngeal erythema without tonsillar enlargement or exudates.  All vital signs are stable and she is afebrile.  Low concern for strep based on current presentation and negative test.  Since she has been exposed to strep, I did advise her to give [place] a call if  something changes such as worsening sore throat or she develops a fever or exudates.  Patient agrees.  Since she has no other symptoms related to Covid or flu and has had long-haul Covid symptoms until May of this year with vaccinations and flu vaccination, I feel she is low risk for Covid or flu so testing obtained today.  However, I did advise her to return for another exam and consideration of testing if she develops any new or worsening symptoms including cough, congestion, fatigue, weakness, breathing difficulty.  She is agreeable.  Did advise her to increase rest and fluids.  Try Claritin and Flonase to help with postnasal drainage which I believe is causing a sore throat.  Follow-up with [place] as needed.  Final Clinical Impressions(s) / UC Diagnoses   Final diagnoses:  Acute pharyngitis, unspecified etiology  Post-nasal drainage     Discharge Instructions     Your strep test was negative today.  This is a highly accurate test.  However, if your sore throat worsens or you develop a fever please call us or return for another exam since you have been exposed to strep throat.  Consider Covid test he developed other symptoms such as cough, congestion, chest discomfort, breathing difficulty or increased fatigue and achiness.  At this time, I would advise rest and increasing fluids.  Try Claritin to help dry up some of the postnasal drainage.  Can also consider use of Flonase.  Follow-up with [place] as needed.    ED Prescriptions    None     PDMP not reviewed this encounter.   Shirlee Latch, PA-C 02/19/20 [phone]

## 2020-02-19 NOTE — Discharge Instructions (Addendum)
Your strep test was negative today.  This is a highly accurate test.  However, if your sore throat worsens or you develop a fever please call us or return for another exam since you have been exposed to strep throat.  Consider Covid test he developed other symptoms such as cough, congestion, chest discomfort, breathing difficulty or increased fatigue and achiness.  At this time, I would advise rest and increasing fluids.  Try Claritin to help dry up some of the postnasal drainage.  Can also consider use of Flonase.  Follow-up with [place] as needed.

## 2020-02-22 ENCOUNTER — Telehealth (INDEPENDENT_AMBULATORY_CARE_PROVIDER_SITE_OTHER): Payer: Medicare HMO | Admitting: Internal Medicine

## 2020-02-22 ENCOUNTER — Encounter: Payer: Self-pay | Admitting: Internal Medicine

## 2020-02-22 DIAGNOSIS — I480 Paroxysmal atrial fibrillation: Secondary | ICD-10-CM | POA: Diagnosis not present

## 2020-02-22 DIAGNOSIS — E7800 Pure hypercholesterolemia, unspecified: Secondary | ICD-10-CM

## 2020-02-22 DIAGNOSIS — I10 Essential (primary) hypertension: Secondary | ICD-10-CM

## 2020-02-22 DIAGNOSIS — Z794 Long term (current) use of insulin: Secondary | ICD-10-CM

## 2020-02-22 DIAGNOSIS — R059 Cough, unspecified: Secondary | ICD-10-CM

## 2020-02-22 DIAGNOSIS — N183 Chronic kidney disease, stage 3 unspecified: Secondary | ICD-10-CM

## 2020-02-22 DIAGNOSIS — E1122 Type 2 diabetes mellitus with diabetic chronic kidney disease: Secondary | ICD-10-CM

## 2020-02-22 NOTE — Progress Notes (Signed)
Patient ID: Beth Tyler, female   DOB: 1945/08/22, 74 y.o.   MRN: [id number]   Virtual Visit via video Note  This visit type was conducted due to national recommendations for restrictions regarding the COVID-19 pandemic (e.g. social distancing).  This format is felt to be most appropriate for this patient at this time.  All issues noted in this document were discussed and addressed.  No physical exam was performed (except for noted visual exam findings with Video Visits).   I connected with Beth Tyler by a video enabled telemedicine application and verified that I am speaking with the correct person using two identifiers. Location patient: home Location provider: work Persons participating in the virtual visit: patient, provider  The limitations, risks, security and privacy concerns of performing an evaluation and management service by video and the availability of in person appointments have been discussed.  It has also been discussed with the patient that there may be a patient responsible charge related to this service. The patient expressed understanding and agreed to proceed.   Reason for visit: scheduled follow up.    HPI: Has a history of diabetes, hypertension and elevated cholesterol.  Recently evaluated 02/19/20 - for sore throat.  Strep test - negative.  Instructed to try flonase and claritin to help with post nasal drainage.  She is feeling better.  Diagnosed with whooping cough 11/2019.  Minimal cough persists.  She is trying to get control of her sugars.  Has been on metformin, jardiance and basaglar.  Sugars remain elevated.  She has recently started back on humalog 8 units tid.  Discussed checking and recording sugars and sending in readings.  No chest pain.  Breathing stable.  Bowels - same.  Balance is better.  Lower extremity swelling is better.     ROS: See pertinent positives and negatives per HPI.  Past Medical History:  Diagnosis Date  . Anemia   . Arthritis    . B12 deficiency   . Carpal tunnel syndrome, bilateral   . COVID-19 03/18/2019  . Diabetes mellitus (HCC) 02/17/2012  . Diabetes mellitus, type II (HCC)   . Dyspnea   . Gastritis   . GERD (gastroesophageal reflux disease)   . Hypercholesterolemia   . Hypertension   . IBS (irritable bowel syndrome)   . Neuropathy    legs  . Pernicious anemia   . Vertigo    no episodes fo 9 or 10 yrs    Past Surgical History:  Procedure Laterality Date  . ANTERIOR VITRECTOMY Right 12/01/2017   Procedure: ANTERIOR VITRECTOMY;  Surgeon: Nevada Crane, MD;  Location: [facility];  Service: Ophthalmology;  Laterality: Right;  . BREAST BIOPSY Right 1980   neg  . BREAST SURGERY  1980   benign cyst removal  . CATARACT EXTRACTION W/PHACO Left 10/13/2017   Procedure: CATARACT EXTRACTION PHACO AND INTRAOCULAR LENS PLACEMENT (IOC)  LEFT  DIABETES;  Surgeon: Nevada Crane, MD;  Location: [facility];  Service: Ophthalmology;  Laterality: Left;  Diabetic - insulin and oral meds  . CATARACT EXTRACTION W/PHACO Right 12/01/2017   Procedure: CATARACT EXTRACTION PHACO AND INTRAOCULAR LENS PLACEMENT (IOC)  RIGHT DIABETIC;  Surgeon: Nevada Crane, MD;  Location: [facility];  Service: Ophthalmology;  Laterality: Right;  Diabetic - insulin and oral  . CHOLECYSTECTOMY  1983    Family History  Problem Relation Age of Onset  . Diabetes Father   . Kidney disease Father   . Heart disease Father   .  Alcohol abuse Mother   . Dementia Mother   . Diabetes Mother   . Diabetes Other   . Hemophilia Brother   . Uterine cancer Sister   . Diabetes Sister   . COPD Sister   . Breast cancer Maternal Aunt 51  . Breast cancer Daughter 45  . Stroke Daughter   . Diabetes Son     SOCIAL HX: reviewed.    Current Outpatient Medications:  .  acetaminophen (TYLENOL) 500 MG tablet, Take 500-1,000 mg by mouth every 6 (six) hours as needed for mild pain or moderate pain. , Disp: , Rfl:  .   amLODipine (NORVASC) 10 MG tablet, Take 1 tablet by mouth once daily, Disp: 90 tablet, Rfl: 0 .  apixaban (ELIQUIS) 5 MG TABS tablet, Take 1 tablet (5 mg total) by mouth 2 (two) times daily., Disp: 60 tablet, Rfl: 1 .  BD PEN NEEDLE NANO 2ND GEN 32G X 4 MM MISC, USE TO INJECT INSULIN 3 TIMES DAILY, Disp: 200 each, Rfl: 0 .  Continuous Blood Gluc Sensor (FREESTYLE LIBRE 2 SENSOR) MISC, Use to check sugar at least 4 times daily, Disp: 2 each, Rfl: 11 .  empagliflozin ([name]) 25 MG TABS tablet, Take 1 tablet (25 mg total) by mouth daily before breakfast., Disp: 30 tablet, Rfl: 2 .  fluticasone (CUTIVATE) 0.05 % cream, Apply 1 application topically as needed. Use as directed., Disp: , Rfl:  .  Insulin Glargine (BASAGLAR KWIKPEN) 100 UNIT/ML, Inject 28 Units into the skin every morning., Disp: , Rfl:  .  insulin lispro ([name]) 100 UNIT/ML injection, Inject 0.04-0.08 mLs (4-8 Units total) into the skin 3 (three) times daily as needed for high blood sugar., Disp: 10 mL, Rfl:  .  magnesium oxide (MAG-OX) 400 (241.3 Mg) MG tablet, Take 1 tablet by mouth once daily, Disp: 90 tablet, Rfl: 0 .  metFORMIN (GLUCOPHAGE-XR) 500 MG 24 hr tablet, Take 2 tablets (1,000 mg total) by mouth 2 (two) times daily., Disp: 360 tablet, Rfl: 3 .  metoprolol tartrate (LOPRESSOR) 25 MG tablet, Take 25 mg by mouth 2 (two) times daily., Disp: , Rfl:  .  ONETOUCH VERIO test strip, USE 1 STRIP TO CHECK GLUCOSE TWICE DAILY, Disp: 200 each, Rfl: 0 .  pantoprazole (PROTONIX) 40 MG tablet, TAKE 1 TABLET BY MOUTH TWICE DAILY BEFORE MEAL(S), Disp: 180 tablet, Rfl: 0 .  simvastatin (ZOCOR) 10 MG tablet, TAKE 1 TABLET BY MOUTH AT BEDTIME, Disp: 90 tablet, Rfl: 0 .  tizanidine (ZANAFLEX) 2 MG capsule, Take 1 capsule (2 mg total) by mouth daily., Disp: 15 capsule, Rfl: 0 .  triamcinolone cream (KENALOG) 0.1 %, APPLY CREAM TOPICALLY TWICE DAILY, Disp: 15 g, Rfl: 0  EXAM:  VITALS per patient if applicable: 127/68  GENERAL: alert,  oriented, appears well and in no acute distress  HEENT: atraumatic, conjunttiva clear, no obvious abnormalities on inspection of external nose and ears  NECK: normal movements of the head and neck  LUNGS: on inspection no signs of respiratory distress, breathing rate appears normal, no obvious gross SOB, gasping or wheezing  CV: no obvious cyanosis  PSYCH/NEURO: pleasant and cooperative, no obvious depression or anxiety, speech and thought processing grossly intact  ASSESSMENT AND PLAN:  Discussed the following assessment and plan:  Problem List Items Addressed This Visit    Type 2 diabetes mellitus with diabetic chronic kidney disease (HCC)    Discussed diet and exercise.  Sugars still elevated.  She has been on metformin, basaglar and jardiance.  Has recently started humalog tid - 8 units.  Discussed checking sugars and send in readings.  Adjust medication pending review of sugar readings.        Stage 3 chronic kidney disease (HCC)    Stay hydrated.  Avoid antiinflammatories.  Follow metabolic panel.        Paroxysmal A-fib (HCC)    Overall appears to be stable.  Continue eliquis and metoprolol.  Follow.        Hypercholesterolemia    On simvastatin.  Low cholesterol diet and exercise.  Follow lipid panel and liver function tests.        Cough    Diagnosed with whooping cough.  Treated with zpak and tessalon perles.  Cough is better now.  Seeing Dr Tim Lair.  Follow.       Benign essential hypertension    Blood pressure doing well.  Continue metoprolol and amlodipine.  Follow pressures.  Follow metabolic panel.           I discussed the assessment and treatment plan with the patient. The patient was provided an opportunity to ask questions and all were answered. The patient agreed with the plan and demonstrated an understanding of the instructions.   The patient was advised to call back or seek an in-person evaluation if the symptoms worsen or if the condition fails  to improve as anticipated.    Dale Concow, MD

## 2020-02-27 ENCOUNTER — Encounter: Payer: Self-pay | Admitting: Internal Medicine

## 2020-02-27 NOTE — Assessment & Plan Note (Signed)
Stay hydrated.  Avoid antiinflammatories.  Follow metabolic panel.   

## 2020-02-27 NOTE — Assessment & Plan Note (Signed)
On simvastatin.  Low cholesterol diet and exercise.  Follow lipid panel and liver function tests.   

## 2020-02-27 NOTE — Assessment & Plan Note (Signed)
Overall appears to be stable.  Continue eliquis and metoprolol.  Follow.

## 2020-02-27 NOTE — Assessment & Plan Note (Signed)
Diagnosed with whooping cough.  Treated with zpak and tessalon perles.  Cough is better now.  Seeing Dr Tim Lair.  Follow.

## 2020-02-27 NOTE — Assessment & Plan Note (Signed)
Blood pressure doing well.  Continue metoprolol and amlodipine.  Follow pressures.  Follow metabolic panel.

## 2020-02-27 NOTE — Assessment & Plan Note (Signed)
Discussed diet and exercise.  Sugars still elevated.  She has been on metformin, basaglar and jardiance.  Has recently started humalog tid - 8 units.  Discussed checking sugars and send in readings.  Adjust medication pending review of sugar readings.

## 2020-03-05 ENCOUNTER — Other Ambulatory Visit: Payer: Self-pay | Admitting: Internal Medicine

## 2020-03-20 ENCOUNTER — Ambulatory Visit: Payer: Medicare HMO | Admitting: Pharmacist

## 2020-03-20 DIAGNOSIS — Z794 Long term (current) use of insulin: Secondary | ICD-10-CM

## 2020-03-20 DIAGNOSIS — N183 Chronic kidney disease, stage 3 unspecified: Secondary | ICD-10-CM

## 2020-03-20 DIAGNOSIS — E7800 Pure hypercholesterolemia, unspecified: Secondary | ICD-10-CM

## 2020-03-20 DIAGNOSIS — I10 Essential (primary) hypertension: Secondary | ICD-10-CM

## 2020-03-20 DIAGNOSIS — I480 Paroxysmal atrial fibrillation: Secondary | ICD-10-CM

## 2020-03-20 NOTE — Chronic Care Management (AMB) (Signed)
Chronic Care Management   Pharmacy Note  03/20/2020 Name: Beth Tyler MRN: [id number] DOB: [birth year]   Subjective:  Beth Tyler is a 74 y.o. year old female who is a primary care patient of Beth Wabasso, MD. The CCM team was consulted for assistance with chronic disease management and care coordination needs.    Engaged with patient by telephone for follow up visit in response to provider referral for pharmacy case management and/or care coordination services.   Consent to Services:  Beth Tyler was given information about Chronic Care Management services, agreed to services, and gave verbal consent prior to initiation of services on 8.6.2020. Please see initial visit note for detailed documentation.   SDOH (Social Determinants of Health) assessments and interventions performed:  SDOH Interventions   Flowsheet Row Most Recent Value  SDOH Interventions   Financial Strain Interventions Other (Comment)  [manufacturer assistance]       Objective:  Lab Results  Component Value Date   CREATININE 0.96 02/07/2020   CREATININE 1.07 (H) 12/06/2019   CREATININE 0.94 09/28/2019    Lab Results  Component Value Date   HGBA1C 9.5 (H) 02/07/2020       Component Value Date/Time   CHOL 129 02/07/2020 0942   TRIG 102.0 02/07/2020 0942   HDL 52.00 02/07/2020 0942   CHOLHDL 2 02/07/2020 0942   VLDL 20.4 02/07/2020 0942   LDLCALC 57 02/07/2020 0942   LDLDIRECT 74.4 02/21/2012 1006   BP Readings from Last 3 Encounters:  02/22/20 127/68  02/19/20 (!) 142/75  12/07/19 (!) 145/77    Assessment/Interventions: Review of patient past medical history, allergies, medications, health status, including review of consultants reports, laboratory and other test data, was performed as part of comprehensive evaluation and provision of chronic care management services.   CCM Care Plan  Allergies  Allergen Reactions  . Hydrocodone Anaphylaxis  . Hydrocodone-Acetaminophen  Anaphylaxis  . Oxycodone-Acetaminophen Anaphylaxis and Other (See Comments)  . Percocet [Oxycodone-Acetaminophen] Anaphylaxis  . Demeclocycline Nausea Only  . Other Nausea Only and Other (See Comments)  . Penicillins Nausea Only    Did it involve swelling of the face/tongue/throat, SOB, or low BP? No Did it involve sudden or severe rash/hives, skin peeling, or any reaction on the inside of your mouth or nose? No Did you need to seek medical attention at a hospital or doctor's office? No When did it last happen? If all above answers are "NO", may proceed with cephalosporin use.  . Tetracyclines & Related Nausea Only    Medications Reviewed Today    Reviewed by [name], RPH-CPP (Pharmacist) on 03/20/20 at 1517  Med List Status: <None>  Medication Order Taking? Sig Documenting Provider Last Dose Status Informant  acetaminophen (TYLENOL) 500 MG tablet [id number]  Take 500-1,000 mg by mouth every 6 (six) hours as needed for mild pain or moderate pain.  [provider]  Active Self  amLODipine (NORVASC) 10 MG tablet [id number] Yes Take 1 tablet by mouth once daily Beth Prosperity, MD Taking Active   apixaban (ELIQUIS) 5 MG TABS tablet [id number] Yes Take 1 tablet (5 mg total) by mouth 2 (two) times daily. Arnetha Courser, MD Taking Active Self  BD PEN NEEDLE NANO 2ND GEN 32G X 4 MM MISC [id number] Yes USE TO INJECT INSULIN 3 TIMES DAILY Beth [place], MD Taking Active   Continuous Blood Gluc Sensor (FREESTYLE LIBRE 2 SENSOR) [place] [id number] Yes Use to check sugar at least 4 times daily Beth Killbuck, MD Taking Active  empagliflozin ([name]) 25 MG TABS tablet [id number] Yes Take 1 tablet (25 mg total) by mouth daily before breakfast. Beth Spivey, MD Taking Active   fluticasone (CUTIVATE) 0.05 % cream [id number]  Apply 1 application topically as needed. Use as directed. Darlin Priestly, MD  Active Self  Insulin Glargine [facility]) 100 UNIT/ML [id number] Yes Inject 28  Units into the skin every morning. Beth [place], MD Taking Active            Med Note Ara Kussmaul Mar 20, 2020  3:14 PM) 25 units  insulin lispro ([name]) 100 UNIT/ML injection [id number] Yes Inject 0.04-0.08 mLs (4-8 Units total) into the skin 3 (three) times daily as needed for high blood sugar. Beth [place], MD Taking Active            Med Note Ara Kussmaul Mar 20, 2020  3:14 PM) 4-5 breakfast, 8 lunch, 5 supper   magnesium oxide (MAG-OX) 400 (241.3 Mg) MG tablet [id number] Yes Take 1 tablet by mouth once daily Beth [place], MD Taking Active   metFORMIN (GLUCOPHAGE-XR) 500 MG 24 hr tablet [id number] Yes Take 2 tablets (1,000 mg total) by mouth 2 (two) times daily. Beth [place], MD Taking Active   metoprolol tartrate (LOPRESSOR) 25 MG tablet [id number] Yes Take 25 mg by mouth 2 (two) times daily. [provider] Taking Active   [facility] VERIO test strip [id number] Yes USE 1 STRIP TO CHECK GLUCOSE TWICE DAILY Beth [place], MD Taking Active   pantoprazole (PROTONIX) 40 MG tablet [id number] Yes TAKE 1 TABLET BY MOUTH TWICE DAILY BEFORE MEAL(S) Beth [place], MD Taking Active   simvastatin (ZOCOR) 10 MG tablet [id number] Yes TAKE 1 TABLET BY MOUTH AT BEDTIME Beth [place], MD Taking Active   tizanidine (ZANAFLEX) 2 MG capsule [id number] Yes Take 1 capsule (2 mg total) by mouth daily. Beth [place], MD Taking Active   triamcinolone (KENALOG) 0.1 % [id number] Yes APPLY  CREAM EXTERNALLY TWICE DAILY Beth , MD Taking Active           Patient Active Problem List   Diagnosis Date Noted  . Hypercholesterolemia 08/07/2019  . Change in bowel movement 08/07/2019  . Benign essential hypertension 04/06/2019  . Stage 3 chronic kidney disease (HCC) 04/06/2019  . Type 2 diabetes mellitus with diabetic chronic kidney disease (HCC) 04/06/2019  . Paroxysmal A-fib (HCC) 04/06/2019  . Back pain 05/23/2015  . Cough 02/13/2015  . Colon cancer  screening 01/19/2014     Conditions to be addressed/monitored per PCP order: diabetes, hypertension, hyperlipidemia, atrial fibrillation  Patient Care Plan: Medication Management    Problem Identified: Diabetes, CKD, IBS     Long-Range Goal: Disease Progression Prevention   This Visit's Progress: On track  Priority: High  Note:   Current Barriers:  . Unable to independently afford treatment regimen . Unable to achieve control of diabetes   Pharmacist Clinical Goal(s):  Marland Kitchen Over the next 90 days, patient will verbalize ability to afford treatment regimen. . Over the next 90 days, patient will achieve control of diabetes as evidenced by improvement in A1c through collaboration with PharmD and provider.   Interventions: . Inter-disciplinary care team collaboration (see longitudinal plan of care) . Comprehensive medication review performed; medication list updated in electronic medical record  Diabetes: . Uncontrolled/controlled; current treatment: metformin XR 1000 mg BID, [name] 25 mg daily, Basaglar 25 units daily, [name] 5-6 units breakfast, 8 units lunch, 5 units supper o Avoiding GLP1 d/t concurrent IBS-D .  Current glucose readings: using Libre 2 CGM, paying cash price [employer] coverage was more expensive) Date of Download: 03/20/20 14 day Average Glucose: 159 mg/dL - Midnight to 6 am: 631 - 6 am to 12 pm: 129 - 12 pm to 6 pm: 176 - 6 pm to midnight: 197 Time in Goal:  - Time in range 70-180: 70% - Time above range: 30% - Time below range: 0% . Meal patterns: breakfast around 10-11 am; lunch around 2-3 pm; supper around 7-8 pm . Glucose elevations highest throughout the afternoon/into the evening. Will increase prandial coverage. Increase breakfast insulin 6-7 units, lunch insulin to 10 units. Continue supper dose for now to prevent overnight hypoglycemia. Continue metformin XR 1000 mg BID, [name] 25 mg daily, Basaglar 25 units daily . Submitted 2022 reapplication for  [name] and [name]. Continue to collaborate w/ CPhT on follow up  Hypertension: . Controlled; current treatment: amlodipine 10 mg QPM, metoprolol tartrate 25 mg BID . Recommended to continue current regimen at this time  Hyperlipidemia: . Controlled; current treatment: simvastatin 10 mg daily (though notes that sometimes takes every other day due to "how well controlled" her last LDL was) . Educated on pleotropic benefits of statins and benefit of daily dosing. Reviewed that there is no known harm associated with low LDL, so no danger of daily simvastatin dose. Continue daily dosing at this time. Patient verbalized understanding  Atrial Fibrillation: . Appropriately managed; current rate control: metoprolol tartrate 25 mg BID; anticoagulant treatment: Eliquis 5 mg BID . Recommended to continue current regimen.  Eliquis application for BMS submitted by CPhT on 03/17/20, will continue to follow  IBS w/ pancreatic enzyme insufficiency: . Not well managed per patient report, but improved. Notes frequent stool this morning before eating. Current regimen: [name] 3 capsules with meals and 2 with snacks. Follows w/ [facility] . Continue current regimen at this time. Collaborated w/ CPhT to submit patient assistance for 2022.    Patient Goals/Self-Care Activities . Over the next 90 days, patient will:  - take medications as prescribed check blood glucose TID using CGM, document, and provide at future appointments collaborate with provider on medication access solutions  Follow Up Plan: Telephone follow up appointment with care management team member scheduled for: ~ 4 weeks      Medication Assistance: Application for [name], [name]), BI ([name]), [name] ([name]), BMS (Eliquis) medication assistance program in process. Anticipated assistance start date TBD. See plan of care below for additional detail.   Plan: Telephone follow up appointment with care management team member  scheduled for: ~ 5 weeks  Catie Feliz Beam, PharmD, [place], CPP Clinical Pharmacist [facility] [employer] [phone]

## 2020-03-20 NOTE — Patient Instructions (Signed)
Visit Information  Patient Care Plan: Medication Management    Problem Identified: Diabetes, CKD, IBS     Long-Range Goal: Disease Progression Prevention   This Visit's Progress: On track  Priority: High  Note:   Current Barriers:  . Unable to independently afford treatment regimen . Unable to achieve control of diabetes   Pharmacist Clinical Goal(s):  Marland Kitchen Over the next 90 days, patient will verbalize ability to afford treatment regimen. . Over the next 90 days, patient will achieve control of diabetes as evidenced by improvement in A1c through collaboration with PharmD and provider.   Interventions: . Inter-disciplinary care team collaboration (see longitudinal plan of care) . Comprehensive medication review performed; medication list updated in electronic medical record  Diabetes: . Uncontrolled/controlled; current treatment: metformin XR 1000 mg BID, [name] 25 mg daily, Basaglar 25 units daily, [name] 5-6 units breakfast, 8 units lunch, 5 units supper o Avoiding GLP1 d/t concurrent IBS-D . Current glucose readings: using Libre 2 CGM, paying cash price [employer] coverage was more expensive) Date of Download: 03/20/20 14 day Average Glucose: 159 mg/dL - Midnight to 6 am: 256 - 6 am to 12 pm: 129 - 12 pm to 6 pm: 176 - 6 pm to midnight: 197 Time in Goal:  - Time in range 70-180: 70% - Time above range: 30% - Time below range: 0% . Meal patterns: breakfast around 10-11 am; lunch around 2-3 pm; supper around 7-8 pm . Glucose elevations highest throughout the afternoon/into the evening. Will increase prandial coverage. Increase breakfast insulin 6-7 units, lunch insulin to 10 units. Continue supper dose for now to prevent overnight hypoglycemia. Continue metformin XR 1000 mg BID, [name] 25 mg daily, Basaglar 25 units daily . Submitted 2022 reapplication for [name] and [name]. Continue to collaborate w/ CPhT on follow up  Hypertension: . Controlled; current treatment:  amlodipine 10 mg QPM, metoprolol tartrate 25 mg BID . Recommended to continue current regimen at this time  Hyperlipidemia: . Controlled; current treatment: simvastatin 10 mg daily (though notes that sometimes takes every other day due to "how well controlled" her last LDL was) . Educated on pleotropic benefits of statins and benefit of daily dosing. Reviewed that there is no known harm associated with low LDL, so no danger of daily simvastatin dose. Continue daily dosing at this time. Patient verbalized understanding  Atrial Fibrillation: . Appropriately managed; current rate control: metoprolol tartrate 25 mg BID; anticoagulant treatment: Eliquis 5 mg BID . Recommended to continue current regimen.  Eliquis application for BMS submitted by CPhT on 03/17/20, will continue to follow  IBS w/ pancreatic enzyme insufficiency: . Not well managed per patient report, but improved. Notes frequent stool this morning before eating. Current regimen: [name] 3 capsules with meals and 2 with snacks. [facility]s w/ [facility] . Continue current regimen at this time. Collaborated w/ CPhT to submit patient assistance for 2022.    Patient Goals/Self-Care Activities . Over the next 90 days, patient will:  - take medications as prescribed check blood glucose TID using CGM, document, and provide at future appointments collaborate with provider on medication access solutions  Follow Up Plan: Telephone follow up appointment with care management team member scheduled for: ~ 4 weeks      The patient verbalized understanding of instructions, educational materials, and care plan provided today and declined offer to receive copy of patient instructions, educational materials, and care plan.   Medication Assistance: Application for [employer], [name]), BI ([name]), [name] ([name]), BMS (Eliquis) medication assistance program  in process. Anticipated assistance start date TBD. See plan of care below for additional  detail.   Plan: Telephone follow up appointment with care management team member scheduled for: ~ 5 weeks  Catie Feliz Beam, PharmD, [place], CPP Clinical Pharmacist [facility] [employer] [phone]

## 2020-04-12 ENCOUNTER — Telehealth: Payer: Self-pay | Admitting: Pharmacist

## 2020-04-12 ENCOUNTER — Other Ambulatory Visit: Payer: Self-pay | Admitting: Internal Medicine

## 2020-04-12 NOTE — Telephone Encounter (Signed)
Patient noted to CPhT that she is going to run out of her home supply of [name] before she receives next shipment from [employer].   Medication Samples have been provided to the patient.  Drug name: [name]       Strength: 25 mg        Qty: 3 boxes LOT: [id number] Exp.Date: 12/2021     

## 2020-04-13 ENCOUNTER — Other Ambulatory Visit: Payer: Self-pay | Admitting: Internal Medicine

## 2020-04-14 ENCOUNTER — Other Ambulatory Visit: Payer: Self-pay | Admitting: Internal Medicine

## 2020-04-14 DIAGNOSIS — E1140 Type 2 diabetes mellitus with diabetic neuropathy, unspecified: Secondary | ICD-10-CM

## 2020-04-20 NOTE — Telephone Encounter (Signed)
Confirmed patient doing ok and has appt on 1/25 with [place]. She will let us know if she needs something before then

## 2020-04-20 NOTE — Telephone Encounter (Signed)
Please f/u and confirm pt doing ok and does not need anything.

## 2020-04-20 NOTE — Telephone Encounter (Signed)
Called patient to follow up on this. She notes she tested positive for COVID last Thursday 04/13/20. Notes she is feeling "loads better" today than over the weekend. Declines any needs at this time. Routing to PCP for FYI.   Notes that she received [name] and [name] from patient assistance last night. Will update CPhT. No longer needs [name] sample, so I will return to stock.

## 2020-04-20 NOTE — Telephone Encounter (Signed)
See phone message.   Dr Lorin Picket

## 2020-05-01 ENCOUNTER — Telehealth: Payer: Medicare HMO

## 2020-05-02 ENCOUNTER — Telehealth: Payer: Self-pay | Admitting: Internal Medicine

## 2020-05-02 ENCOUNTER — Encounter: Payer: Medicare HMO | Admitting: Internal Medicine

## 2020-05-02 DIAGNOSIS — Z794 Long term (current) use of insulin: Secondary | ICD-10-CM

## 2020-05-02 DIAGNOSIS — E538 Deficiency of other specified B group vitamins: Secondary | ICD-10-CM

## 2020-05-02 DIAGNOSIS — N183 Chronic kidney disease, stage 3 unspecified: Secondary | ICD-10-CM

## 2020-05-02 DIAGNOSIS — E7800 Pure hypercholesterolemia, unspecified: Secondary | ICD-10-CM

## 2020-05-02 DIAGNOSIS — I10 Essential (primary) hypertension: Secondary | ICD-10-CM

## 2020-05-02 NOTE — Telephone Encounter (Signed)
Patient has a cpe appointment 06/07/20 and would like to have labs and also check her B12 level. No orders are in chart.

## 2020-05-03 ENCOUNTER — Encounter: Payer: Self-pay | Admitting: Internal Medicine

## 2020-05-03 ENCOUNTER — Other Ambulatory Visit: Payer: Self-pay | Admitting: Internal Medicine

## 2020-05-03 MED ORDER — ONETOUCH VERIO VI STRP
ORAL_STRIP | 0 refills | Status: DC
Start: 2020-05-03 — End: 2021-01-18

## 2020-05-03 NOTE — Telephone Encounter (Signed)
Lm on vm to call office to schedule lab appt before 06/07/20 appointment.

## 2020-05-03 NOTE — Telephone Encounter (Signed)
I have ordered her labs and a b12 level. Please call and schedule her for a fasting lab appt prior to her appt on March 2.

## 2020-05-26 IMAGING — CR DG CHEST 2V
2 series · 2 of 2 positions shown · non-contrast
Comparison: Chest radiograph October 10, 2017

CLINICAL DATA: Fever, urinary tract infection. Eye surgery 2 days
ago. History of interstitial lung disease.

EXAM:
CHEST - 2 VIEW

[chest pa]
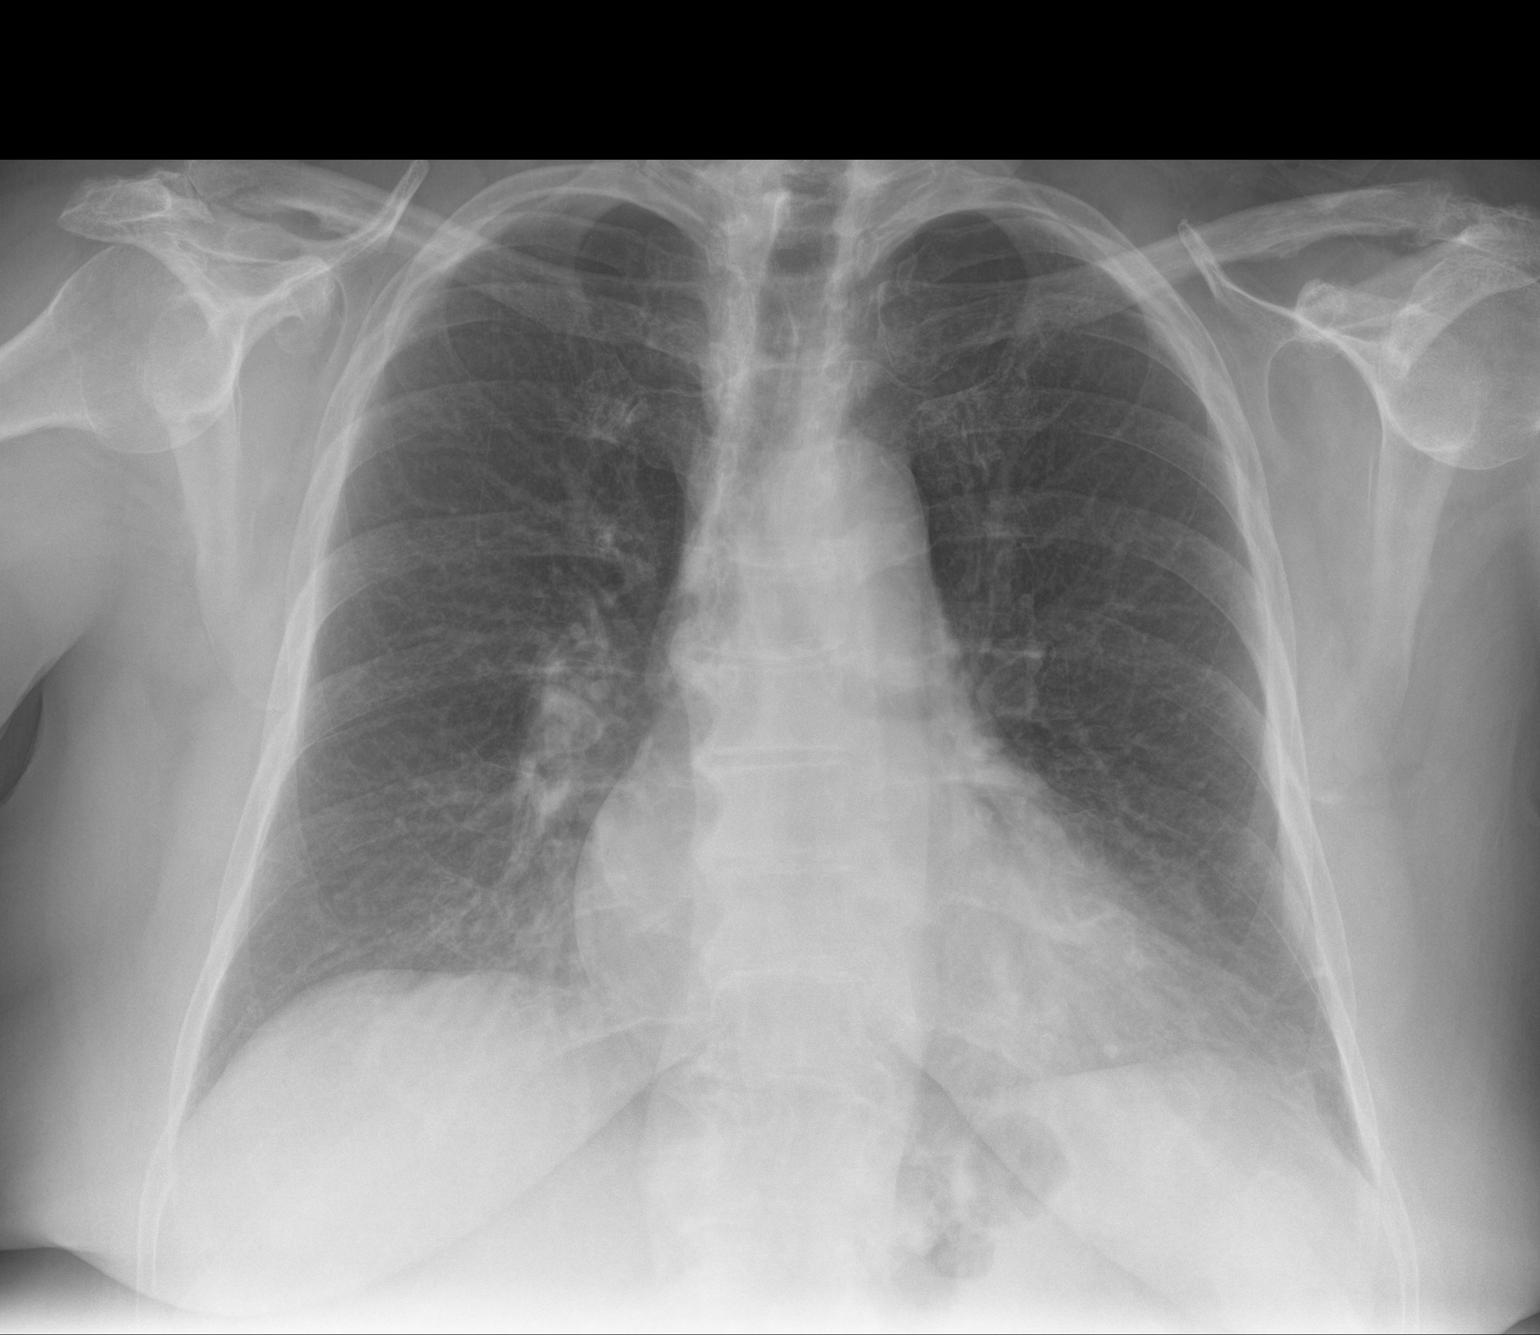

[chest lat]
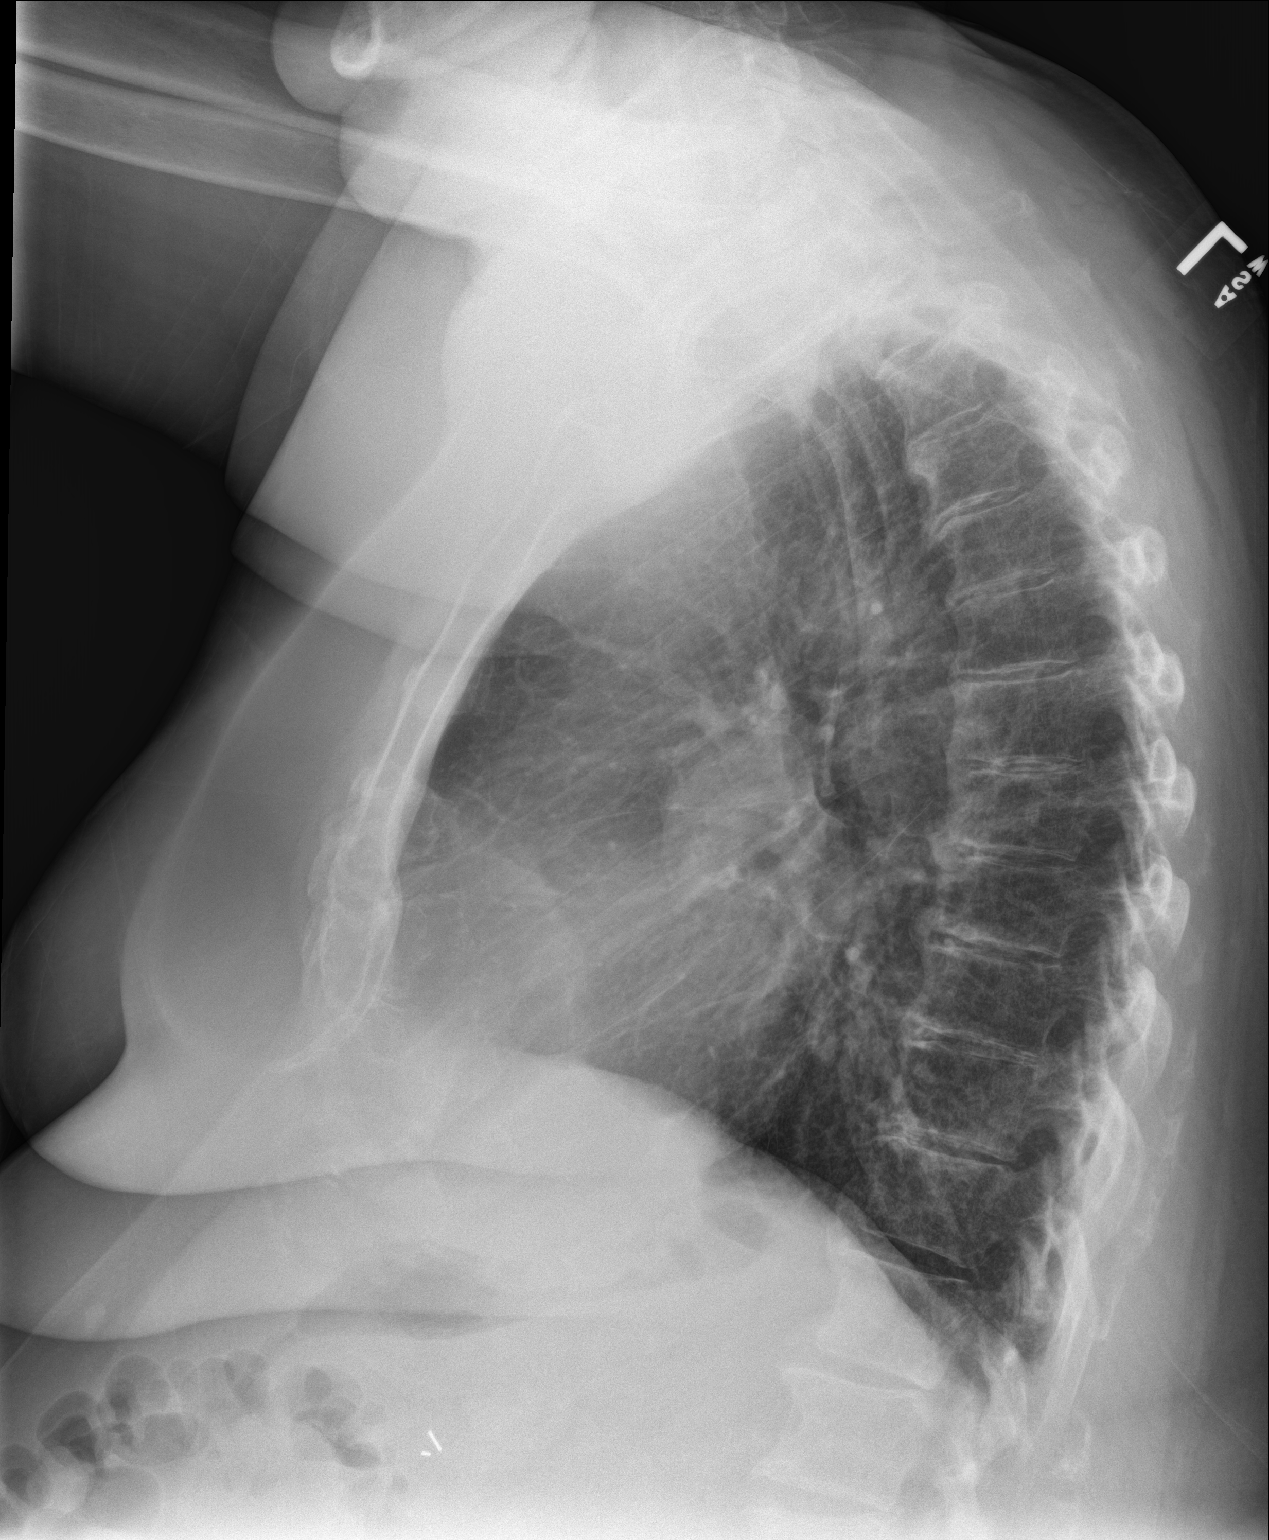

[2 of 2 positions shown; findings below may reference images not displayed]

FINDINGS: Cardiac silhouette is mildly enlarged unchanged. Mildly calcified
aortic arch. Chronic interstitial changes without pleural effusion.
Similar LEFT lung base scarring. No pneumothorax. Soft tissue planes
and included osseous structures are non suspicious. Stable
degenerative changes thoracic spine. Surgical clips in the included
right abdomen compatible with cholecystectomy.
IMPRESSION: 1. Stable cardiomegaly.
2. Stable chronic interstitial changes.

## 2020-06-02 MED ORDER — FLUTICASONE PROPIONATE 0.05 % EX CREA
1.0000 | TOPICAL_CREAM | CUTANEOUS | 0 refills | Status: DC | PRN
Start: 2020-06-02 — End: 2021-09-21

## 2020-06-02 NOTE — Telephone Encounter (Signed)
Called patient to find out exactly why this medication is needed. She uses prn on face and behind her ears during the colder months on her dry patches that she has had on and off for years. Last fill was 03/2019. Sent in refill for her to have on hand.

## 2020-06-05 ENCOUNTER — Other Ambulatory Visit: Payer: Medicare HMO

## 2020-06-07 ENCOUNTER — Encounter: Payer: Medicare HMO | Admitting: Internal Medicine

## 2020-06-27 DIAGNOSIS — I480 Paroxysmal atrial fibrillation: Secondary | ICD-10-CM | POA: Diagnosis not present

## 2020-06-27 DIAGNOSIS — R0602 Shortness of breath: Secondary | ICD-10-CM | POA: Diagnosis not present

## 2020-06-27 DIAGNOSIS — I10 Essential (primary) hypertension: Secondary | ICD-10-CM | POA: Diagnosis not present

## 2020-06-27 DIAGNOSIS — E1169 Type 2 diabetes mellitus with other specified complication: Secondary | ICD-10-CM | POA: Diagnosis not present

## 2020-06-27 DIAGNOSIS — I890 Lymphedema, not elsewhere classified: Secondary | ICD-10-CM | POA: Diagnosis not present

## 2020-06-28 ENCOUNTER — Telehealth: Payer: Self-pay | Admitting: Internal Medicine

## 2020-06-28 NOTE — Telephone Encounter (Signed)
Patient is taking elequis and metoprolol BID at 8am and 8pm. Patient stated she informed them yesterday and today she was told to call PCP. Current BP is 158/85 BPM 78. Patient was informed of recommendation.

## 2020-06-28 NOTE — Telephone Encounter (Signed)
Patient appointments has been scheduled.

## 2020-06-28 NOTE — Telephone Encounter (Signed)
Spoken to patient, she has high BP, she would like to add more or change up her BP medication, She is not having any sx of blurry vision, headaches, jaw px, arm px, chest px, SOB, and heart palpitations. She feels normal. Patient prefers not to go to an UC, she really wants her meds changed. Please advise.

## 2020-06-28 NOTE — Telephone Encounter (Signed)
Pt called she had to call EMS earlier her BP was  220/101 she took her metoprolol tartrate (LOPRESSOR) 25 MG tablet it went down to 151/78 Her BP is 192/87 right now she is not having any other symptoms

## 2020-06-28 NOTE — Telephone Encounter (Signed)
Spoke to Ms Beth Tyler.  She is feeling fine.  Called EMS this am when pressure was >200.  They rechecked - improved. EKG ok per her report.  She is feeling fine.  No headache. No dizziness.  No chest pain or sob.  Did notice this am - some increased heart rate with elevated blood pressure (PR 103).  78 now.  Feels she may have had a nightmare, etc.  Discussed increasing metoprolol.  She is agreeable.  Needs f/u appt with me.  I can work her in 07/07/20 at 12:00.  Also needs fasting labs prior to appt thanks.

## 2020-06-28 NOTE — Telephone Encounter (Signed)
She saw cardiology yesterday.  Blood pressure per their note 118/68.  Need to confirm how she is taking her blood pressure medication.  What is her pulse rate?  If she is running systolics over 200 or more, she needs to be evaluated.  Also, needs to make cardiology aware of the acute change in blood pressure.

## 2020-07-01 ENCOUNTER — Other Ambulatory Visit: Payer: Self-pay | Admitting: Internal Medicine

## 2020-07-01 DIAGNOSIS — E1140 Type 2 diabetes mellitus with diabetic neuropathy, unspecified: Secondary | ICD-10-CM

## 2020-07-01 DIAGNOSIS — Z794 Long term (current) use of insulin: Secondary | ICD-10-CM

## 2020-07-03 ENCOUNTER — Other Ambulatory Visit (INDEPENDENT_AMBULATORY_CARE_PROVIDER_SITE_OTHER): Payer: Medicare HMO

## 2020-07-03 ENCOUNTER — Other Ambulatory Visit: Payer: Self-pay

## 2020-07-03 DIAGNOSIS — E1122 Type 2 diabetes mellitus with diabetic chronic kidney disease: Secondary | ICD-10-CM

## 2020-07-03 DIAGNOSIS — Z794 Long term (current) use of insulin: Secondary | ICD-10-CM

## 2020-07-03 DIAGNOSIS — E7800 Pure hypercholesterolemia, unspecified: Secondary | ICD-10-CM | POA: Diagnosis not present

## 2020-07-03 DIAGNOSIS — E538 Deficiency of other specified B group vitamins: Secondary | ICD-10-CM

## 2020-07-03 DIAGNOSIS — N183 Chronic kidney disease, stage 3 unspecified: Secondary | ICD-10-CM | POA: Diagnosis not present

## 2020-07-03 DIAGNOSIS — I10 Essential (primary) hypertension: Secondary | ICD-10-CM

## 2020-07-03 LAB — BASIC METABOLIC PANEL
BUN: 18 mg/dL (ref 6–23)
CO2: 29 mEq/L (ref 19–32)
Calcium: 9.5 mg/dL (ref 8.4–10.5)
Chloride: 107 mEq/L (ref 96–112)
Creatinine, Ser: 0.83 mg/dL (ref 0.40–1.20)
GFR: 68.79 mL/min (ref >60.00)
Glucose, Bld: 89 mg/dL (ref 70–99)
Potassium: 3.9 mEq/L (ref 3.5–5.1)
Sodium: 145 mEq/L (ref 135–145)

## 2020-07-03 LAB — LIPID PANEL
Cholesterol: 111 mg/dL (ref 0–200)
HDL: 54.9 mg/dL (ref >39.00)
LDL Cholesterol: 40 mg/dL (ref 0–99)
NonHDL: 56.11
Total CHOL/HDL Ratio: 2
Triglycerides: 82 mg/dL (ref 0.0–149.0)
VLDL: 16.4 mg/dL (ref 0.0–40.0)

## 2020-07-03 LAB — CBC WITH DIFFERENTIAL/PLATELET
Basophils Absolute: 0.1 10*3/uL (ref 0.0–0.1)
Basophils Relative: 1 % (ref 0.0–3.0)
Eosinophils Absolute: 0.4 10*3/uL (ref 0.0–0.7)
Eosinophils Relative: 4.5 % (ref 0.0–5.0)
HCT: 39.5 % (ref 36.0–46.0)
Hemoglobin: 13.2 g/dL (ref 12.0–15.0)
Lymphocytes Relative: 31.6 % (ref 12.0–46.0)
Lymphs Abs: 2.8 10*3/uL (ref 0.7–4.0)
MCHC: 33.4 g/dL (ref 30.0–36.0)
MCV: 88.3 fl (ref 78.0–100.0)
Monocytes Absolute: 0.8 10*3/uL (ref 0.1–1.0)
Monocytes Relative: 9.1 % (ref 3.0–12.0)
Neutro Abs: 4.8 10*3/uL (ref 1.4–7.7)
Neutrophils Relative %: 53.8 % (ref 43.0–77.0)
Platelets: 275 10*3/uL (ref 150.0–400.0)
RBC: 4.47 Mil/uL (ref 3.87–5.11)
RDW: 14.5 % (ref 11.5–15.5)
WBC: 8.9 10*3/uL (ref 4.0–10.5)

## 2020-07-03 LAB — HEPATIC FUNCTION PANEL
ALT: 17 U/L (ref 0–35)
AST: 14 U/L (ref 0–37)
Albumin: 4.6 g/dL (ref 3.5–5.2)
Alkaline Phosphatase: 84 U/L (ref 39–117)
Bilirubin, Direct: 0.2 mg/dL (ref 0.0–0.3)
Total Bilirubin: 0.5 mg/dL (ref 0.2–1.2)
Total Protein: 6.8 g/dL (ref 6.0–8.3)

## 2020-07-03 LAB — TSH: TSH: 1.53 u[IU]/mL (ref 0.35–4.50)

## 2020-07-03 LAB — HEMOGLOBIN A1C: Hgb A1c MFr Bld: 6.9 % — ABNORMAL HIGH (ref 4.6–6.5)

## 2020-07-03 LAB — VITAMIN B12: Vitamin B-12: 800 pg/mL (ref 211–911)

## 2020-07-04 ENCOUNTER — Other Ambulatory Visit: Payer: Self-pay | Admitting: Internal Medicine

## 2020-07-05 ENCOUNTER — Ambulatory Visit (INDEPENDENT_AMBULATORY_CARE_PROVIDER_SITE_OTHER): Payer: Medicare HMO | Admitting: Pharmacist

## 2020-07-05 DIAGNOSIS — N183 Chronic kidney disease, stage 3 unspecified: Secondary | ICD-10-CM

## 2020-07-05 DIAGNOSIS — I10 Essential (primary) hypertension: Secondary | ICD-10-CM

## 2020-07-05 DIAGNOSIS — E1122 Type 2 diabetes mellitus with diabetic chronic kidney disease: Secondary | ICD-10-CM | POA: Diagnosis not present

## 2020-07-05 DIAGNOSIS — Z794 Long term (current) use of insulin: Secondary | ICD-10-CM | POA: Diagnosis not present

## 2020-07-05 DIAGNOSIS — I480 Paroxysmal atrial fibrillation: Secondary | ICD-10-CM | POA: Diagnosis not present

## 2020-07-05 DIAGNOSIS — E7800 Pure hypercholesterolemia, unspecified: Secondary | ICD-10-CM | POA: Diagnosis not present

## 2020-07-05 MED ORDER — APIXABAN 5 MG PO TABS: 5.0000 mg | ORAL_TABLET | Freq: Two times a day (BID) | ORAL | 1 refills | Status: DC

## 2020-07-05 NOTE — Chronic Care Management (AMB) (Signed)
Chronic Care Management Pharmacy Note  07/05/2020 Name:  Beth Tyler MRN:  [id number] DOB:  18-Aug-1945  Subjective: Beth Tyler is an 75 y.o. year old female who is a primary patient of Einar Pheasant, MD.  The CCM team was consulted for assistance with disease management and care coordination needs.    Engaged with patient by telephone for follow up visit in response to provider referral for pharmacy case management and/or care coordination services.   Consent to Services:  The patient was given information about Chronic Care Management services, agreed to services, and gave verbal consent prior to initiation of services.  Please see initial visit note for detailed documentation.   Patient Care Team: Einar Pheasant, MD as PCP - General (Internal Medicine) De Hollingshead, RPH-CPP as Pharmacist (Pharmacist)  Recent office visits: None since our last call  Recent consult visits:  3/22 - Cardiology Dr. Saralyn Pilar, no changes made  Hospital visits: None in previous 6 months  Objective:  Lab Results  Component Value Date   CREATININE 0.83 07/03/2020   CREATININE 0.96 02/07/2020   CREATININE 1.07 (H) 12/06/2019    Lab Results  Component Value Date   HGBA1C 6.9 (H) 07/03/2020   Last diabetic Eye exam:  Lab Results  Component Value Date/Time   HMDIABEYEEXA No Retinopathy 02/04/2020 12:00 AM    Last diabetic Foot exam:  Lab Results  Component Value Date/Time   HMDIABFOOTEX on my exam 03/04/2017 12:00 AM        Component Value Date/Time   CHOL 111 07/03/2020 1026   TRIG 82.0 07/03/2020 1026   HDL 54.90 07/03/2020 1026   CHOLHDL 2 07/03/2020 1026   VLDL 16.4 07/03/2020 1026   LDLCALC 40 07/03/2020 1026   LDLDIRECT 74.4 02/21/2012 1006    Hepatic Function Latest Ref Rng & Units 07/03/2020 02/07/2020 07/27/2019  Total Protein 6.0 - 8.3 g/dL 6.8 7.2 7.1  Albumin 3.5 - 5.2 g/dL 4.6 4.5 4.5  AST 0 - 37 U/L 14 15 15   ALT 0 - 35 U/L 17 15 12   Alk  Phosphatase 39 - 117 U/L 84 90 73  Total Bilirubin 0.2 - 1.2 mg/dL 0.5 0.7 0.5  Bilirubin, Direct 0.0 - 0.3 mg/dL 0.2 0.2 0.1    Lab Results  Component Value Date/Time   TSH 1.53 07/03/2020 10:26 AM   TSH 1.514 04/27/2019 04:03 PM   TSH 0.914 04/05/2019 06:36 AM   TSH 2.70 09/10/2018 10:23 AM   FREET4 1.53 (H) 04/04/2019 04:03 PM    CBC Latest Ref Rng & Units 07/03/2020 02/07/2020 12/06/2019  WBC 4.0 - 10.5 K/uL 8.9 10.0 11.2(H)  Hemoglobin 12.0 - 15.0 g/dL 13.2 13.7 13.6  Hematocrit 36.0 - 46.0 % 39.5 41.7 40.9  Platelets 150.0 - 400.0 K/uL 275.0 264.0 312     Clinical ASCVD: No   CHA2DS2-VASc Score = 5  This indicates a 7.2% annual risk of stroke. The patient's score is based upon: CHF History: No HTN History: Yes Diabetes History: Yes Stroke History: No Vascular Disease History: No Age Score: 2 Gender Score: 1  Social History   Tobacco Use  Smoking Status Never Smoker  Smokeless Tobacco Never Used   BP Readings from Last 3 Encounters:  02/22/20 127/68  02/19/20 (!) 142/75  12/07/19 (!) 145/77   Pulse Readings from Last 3 Encounters:  02/22/20 68  02/19/20 70  12/07/19 91   Wt Readings from Last 3 Encounters:  02/22/20 183 lb (83 kg)  02/19/20 185 lb (  83.9 kg)  01/28/20 187 lb (84.8 kg)    Assessment: Review of patient past medical history, allergies, medications, health status, including review of consultants reports, laboratory and other test data, was performed as part of comprehensive evaluation and provision of chronic care management services.   SDOH:  (Social Determinants of Health) assessments and interventions performed:  SDOH Interventions   Flowsheet Row Most Recent Value  SDOH Interventions   Financial Strain Interventions Other (Comment)  [manufacturer assistance]      CCM Care Plan  Allergies  Allergen Reactions  . Hydrocodone Anaphylaxis  . Hydrocodone-Acetaminophen Anaphylaxis  . Oxycodone-Acetaminophen Anaphylaxis and Other (See  Comments)  . Percocet [Oxycodone-Acetaminophen] Anaphylaxis  . Demeclocycline Nausea Only  . Other Nausea Only and Other (See Comments)  . Penicillins Nausea Only    Did it involve swelling of the face/tongue/throat, SOB, or low BP? No Did it involve sudden or severe rash/hives, skin peeling, or any reaction on the inside of your mouth or nose? No Did you need to seek medical attention at a hospital or doctor's office? No When did it last happen? If all above answers are "NO", may proceed with cephalosporin use.  . Tetracyclines & Related Nausea Only    Medications Reviewed Today    Reviewed by De Hollingshead, RPH-CPP (Pharmacist) on 03/20/20 at [place] List Status: <None>  Medication Order Taking? Sig Documenting Provider Last Dose Status Informant  acetaminophen (TYLENOL) 500 MG tablet [id number]  Take 500-1,000 mg by mouth every 6 (six) hours as needed for mild pain or moderate pain.  [provider]  Active Self  amLODipine (NORVASC) 10 MG tablet [id number] Yes Take 1 tablet by mouth once daily Einar Pheasant, MD Taking Active   apixaban (ELIQUIS) 5 MG TABS tablet [id number] Yes Take 1 tablet (5 mg total) by mouth 2 (two) times daily. Lorella Nimrod, MD Taking Active Self  BD PEN NEEDLE NANO 2ND GEN 32G X 4 MM MISC [id number] Yes USE TO INJECT INSULIN 3 TIMES DAILY Einar Pheasant, MD Taking Active   Continuous Blood Gluc Sensor (FREESTYLE LIBRE 2 SENSOR) [place] [id number] Yes Use to check sugar at least 4 times daily Einar Pheasant, MD Taking Active   empagliflozin ([name]) 25 MG TABS tablet [id number] Yes Take 1 tablet (25 mg total) by mouth daily before breakfast. Einar Pheasant, MD Taking Active   fluticasone (CUTIVATE) 0.05 % cream [id number]  Apply 1 application topically as needed. Use as directed. Enzo Bi, MD  Active Self  Insulin Glargine [facility]) 100 UNIT/ML [id number] Yes Inject 28 Units into the skin every morning. Einar Pheasant, MD Taking Active             Med Note Mayo Ao Mar 20, 2020  3:14 PM) 25 units  insulin lispro ([name]) 100 UNIT/ML injection [id number] Yes Inject 0.04-0.08 mLs (4-8 Units total) into the skin 3 (three) times daily as needed for high blood sugar. Einar Pheasant, MD Taking Active            Med Note Mayo Ao Mar 20, 2020  3:14 PM) 4-5 breakfast, 8 lunch, 5 supper   magnesium oxide (MAG-OX) 400 (241.3 Mg) MG tablet [id number] Yes Take 1 tablet by mouth once daily Einar Pheasant, MD Taking Active   metFORMIN (GLUCOPHAGE-XR) 500 MG 24 hr tablet [id number] Yes Take 2 tablets (1,000 mg total) by mouth 2 (two) times daily. Einar Pheasant, MD Taking Active   metoprolol tartrate (LOPRESSOR) 25 MG  tablet [id number] Yes Take 25 mg by mouth 2 (two) times daily. [provider] Taking Active   [facility] VERIO test strip [id number] Yes USE 1 STRIP TO CHECK GLUCOSE TWICE DAILY Einar Pheasant, MD Taking Active   pantoprazole (PROTONIX) 40 MG tablet [id number] Yes TAKE 1 TABLET BY MOUTH TWICE DAILY BEFORE MEAL(S) Einar Pheasant, MD Taking Active   simvastatin (ZOCOR) 10 MG tablet [id number] Yes TAKE 1 TABLET BY MOUTH AT BEDTIME Einar Pheasant, MD Taking Active   tizanidine (ZANAFLEX) 2 MG capsule [id number] Yes Take 1 capsule (2 mg total) by mouth daily. Einar Pheasant, MD Taking Active   triamcinolone (KENALOG) 0.1 % [id number] Yes APPLY  CREAM EXTERNALLY TWICE DAILY Einar Pheasant, MD Taking Active           Patient Active Problem List   Diagnosis Date Noted  . Hypercholesterolemia 08/07/2019  . Change in bowel movement 08/07/2019  . Benign essential hypertension 04/06/2019  . Stage 3 chronic kidney disease ([place]) 04/06/2019  . 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 Paroxysmal A-fib ([place]) 04/06/2019  . Back pain 05/23/2015  . Cough 02/13/2015  . Colon cancer screening 01/19/2014    Immunization History  Administered Date(s)  Administered  . Fluad Quad(high Dose 65+) 12/21/2018, 01/28/2020  . Influenza Split 02/04/2012  . Influenza, High Dose Seasonal PF 12/19/2015, 12/17/2016, 01/08/2018  . Influenza,inj,Quad PF,6+ Mos 01/22/2013, 01/19/2014, 12/29/2014  . Moderna Sars-Covid-2 Vaccination 07/07/2019  . Pneumococcal Conjugate-13 02/06/2010, 06/11/2013  . Pneumococcal Polysaccharide-23 02/11/2018    Conditions to be addressed/monitored: Atrial Fibrillation, HTN, HLD, DMII and CKD  Care Plan : Medication Management  Updates made by De Hollingshead, RPH-CPP since 07/05/2020 12:00 AM    Problem: Diabetes, CKD, IBS     Long-Range Goal: Disease Progression Prevention   This Visit's Progress: On track  Recent Progress: On track  Priority: High  Note:   Current Barriers:  . Unable to independently afford treatment regimen . Unable to achieve control of diabetes   Pharmacist Clinical Goal(s):  Marland Kitchen Over the next 90 days, patient will verbalize ability to afford treatment regimen. . Over the next 90 days, patient will achieve control of diabetes as evidenced by improvement in A1c through collaboration with PharmD and provider.   Interventions: . 1:1 collaboration with Einar Pheasant, MD regarding development and update of comprehensive plan of care as evidenced by provider attestation and co-signature . Inter-disciplinary care team collaboration (see longitudinal plan of care) . Comprehensive medication review performed; medication list updated in electronic medical record  Health Maintenance: . COVID in 03/2019. Moderna dose 1 in 06/2019. Never received second dose. Had palpitations after first dose of Moderna. Wonders if she had a second bout of COVID in 04/2020. Recommend to pursue Moderna second dose or Pfizer booster. Would defer until at least 90 days after possible episode of COVID in January. Patient verbalized understanding  Diabetes: . Uncontrolled; current treatment: metformin XR 1000 mg BID,  [name] 25 mg daily, Basaglar 25 units daily, [name] 5-10 units up to three times daily with meals o Avoiding GLP1 d/t concurrent IBS-D . Approved for assistance for Basaglar, [name], and [name] through 2022. Will send Basaglar and [name] refills today per patient request . Current glucose readings: using Libre 2 CGM, paying cash price [employer] coverage was more expensive) Date of Download: 03/20/20 14 day Average Glucose: 128 mg/dL - Midnight to 6 am: 101 - 6 am to 12 pm: 116 - 12 pm to 6 pm: 151 -  6 pm to midnight: 144 Time in Goal:  - Time in range 70-180: 89% - Time above range: 9% - Time below range: 2% . Meal patterns: breakfast around 10-11 am; lunch around 2-3 pm; supper around 7-8 pm . Notes some occasional hypoglycemia, but usually if meal/insulin mismatch. Continue current regimen at this time. Bring CGM reader to upcoming PCP appointment for download . Reviewed Time in Range goals.  Hypertension: . Controlled; current treatment: amlodipine 10 mg QPM, metoprolol tartrate 25 mg BID . Occasional episodes of hypertension w/ palpitations. She notes this will occur in the ~hour before she is due for her next metoprolol tartrate dose. Notes this will wake her up in the middle of the night sometimes. Prompted a call to EMS a few weeks ago.  . Consider change to metoprolol succinate 50 mg daily to provide longer coverage throughout the day. Discuss w/ PCP at upcoming visit.   Hyperlipidemia: . Controlled; current treatment: simvastatin 10 mg daily  . Continue current regimen at this time  Atrial Fibrillation: . Appropriately managed; current rate control: metoprolol tartrate 25 mg BID (see above); anticoagulant treatment: Eliquis 5 mg BID . Calls today as she is having issues completing a refill with patient assistance for Eliquis. She forgot that she needs to meet out of pocket spend criteria to be able to reenroll for 2022.  Marland Kitchen Notes she is out of Eliquis. Sent script today to  [facility].  . Recommended to continue current regimen.  Will follow for meeting out of pocket spend requirement for Eliquis assistance.   IBS w/ pancreatic enzyme insufficiency: . Appropriately managed; Current regimen: [name] 3 capsules with meals and 2 with snacks. Follows w/ [facility] . Re-enrolled for [name] assistance for 2022. Marland Kitchen Continue current regimen at this time. Continue collaboration w/ GI.   Patient Goals/Self-Care Activities . Over the next 90 days, patient will:  - take medications as prescribed check blood glucose at least three times daily using CGM, document, and provide at future appointments collaborate with provider on medication access solutions  Follow Up Plan: Telephone follow up appointment with care management team member scheduled for: ~ 10 weeks      Medication Assistance: [place], [name] obtained through [employer] medication assistance program.  Enrollment ends 04/07/21. [name] obtained through [employer] medication assistance program.  Enrollment ends 04/07/21. [name] obtained through [name] through 03/28/21  Patient's preferred pharmacy is:  [facility] [address], [place] - [place] [address] Phone: [phone] Fax: [phone]  Uses pill box? Yes Pt endorses 100% compliance  Follow Up:  Patient agrees to Care Plan and Follow-up.  Plan: Telephone follow up appointment with care management team member scheduled for:  ~ 12 weeks  Catie Darnelle Maffucci, PharmD, [place], [place] Clinical Pharmacist [employer] at [employer] [phone]

## 2020-07-05 NOTE — Patient Instructions (Signed)
Visit Information  PATIENT GOALS: Goals Addressed              This Visit's Progress     Patient Stated   .  Medication Monitoring (pt-stated)        Patient Goals/Self-Care Activities . Over the next 90 days, patient will:  - take medications as prescribed check blood glucose three times daily using CGM, document, and provide at future appointments collaborate with provider on medication access solutions        Patient verbalizes understanding of instructions provided today and agrees to view in MyChart.   Plan: Telephone follow up appointment with care management team member scheduled for:  ~ 12 weeks  Catie Edgard Debord, PharmD, BCACP, CPP Clinical Pharmacist [facility] at [place] [phone]  

## 2020-07-07 ENCOUNTER — Other Ambulatory Visit: Payer: Self-pay

## 2020-07-07 ENCOUNTER — Ambulatory Visit (INDEPENDENT_AMBULATORY_CARE_PROVIDER_SITE_OTHER): Payer: Medicare HMO | Admitting: Internal Medicine

## 2020-07-07 VITALS — BP 138/74 | HR 73 | Temp 97.6°F | Resp 16 | Ht 62.0 in | Wt 188.0 lb

## 2020-07-07 DIAGNOSIS — Z1231 Encounter for screening mammogram for malignant neoplasm of breast: Secondary | ICD-10-CM | POA: Diagnosis not present

## 2020-07-07 DIAGNOSIS — I700 Atherosclerosis of aorta: Secondary | ICD-10-CM | POA: Diagnosis not present

## 2020-07-07 DIAGNOSIS — Z794 Long term (current) use of insulin: Secondary | ICD-10-CM | POA: Diagnosis not present

## 2020-07-07 DIAGNOSIS — I480 Paroxysmal atrial fibrillation: Secondary | ICD-10-CM

## 2020-07-07 DIAGNOSIS — E7800 Pure hypercholesterolemia, unspecified: Secondary | ICD-10-CM

## 2020-07-07 DIAGNOSIS — E1122 Type 2 diabetes mellitus with diabetic chronic kidney disease: Secondary | ICD-10-CM

## 2020-07-07 DIAGNOSIS — N183 Chronic kidney disease, stage 3 unspecified: Secondary | ICD-10-CM

## 2020-07-07 LAB — HM DIABETES FOOT EXAM

## 2020-07-07 NOTE — Progress Notes (Signed)
Patient ID: Beth Tyler, female   DOB: [birth year], 75 y.o.   MRN: [id number]   Subjective:    Patient ID: Beth Tyler, female    DOB: Jun 27, 1945, 75 y.o.   MRN: [id number]  HPI This visit occurred during the SARS-CoV-2 public health emergency.  Safety protocols were in place, including screening questions prior to the visit, additional usage of staff PPE, and extensive cleaning of exam room while observing appropriate contact time as indicated for disinfecting solutions.  Patient here for a work in appt.  Work in to discuss her blood pressure.  Also f/u on her sugar.  She reports she is doing relatively well. Had an episode recently - elevated blood pressure.  EMS called.  EKG ok per report.  No acute abnormality or findings noted.  Blood pressure has leveled back off now.  Mostly averaging 120-130s systolic readings now.  No chest pain.  Breathing stable.  Taking metformin, jardiance.  Also basaglar daily and humalog with meals.  Sugars - download date 07/07/20 - 14 day average 131.  Time in goal:  Time in target range 86%. Time above 11% high and 1% very high.  Low: 2%. Most of the low sugars occurring in the evening.  States when tries to adjust pm humalog  - 3 units - notices higher at other times.  Eating.  GI symptoms - relatively stable.  Pain better with pancreas enzymes. Still has the bowel issues.    Past Medical History:  Diagnosis Date  . Anemia   . Arthritis   . B12 deficiency   . Carpal tunnel syndrome, bilateral   . COVID-19 03/18/2019  . Diabetes mellitus (HCC) 02/17/2012  . Diabetes mellitus, type II (HCC)   . Dyspnea   . Gastritis   . GERD (gastroesophageal reflux disease)   . Hypercholesterolemia   . Hypertension   . IBS (irritable bowel syndrome)   . Neuropathy    legs  . Pernicious anemia   . Vertigo    no episodes fo 9 or 10 yrs   Past Surgical History:  Procedure Laterality Date  . ANTERIOR VITRECTOMY Right 12/01/2017   Procedure: ANTERIOR VITRECTOMY;   Surgeon: Nevada Crane, MD;  Location: [facility];  Service: Ophthalmology;  Laterality: Right;  . BREAST BIOPSY Right 1980   neg  . BREAST SURGERY  1980   benign cyst removal  . CATARACT EXTRACTION W/PHACO Left 10/13/2017   Procedure: CATARACT EXTRACTION PHACO AND INTRAOCULAR LENS PLACEMENT (IOC)  LEFT  DIABETES;  Surgeon: Nevada Crane, MD;  Location: [facility];  Service: Ophthalmology;  Laterality: Left;  Diabetic - insulin and oral meds  . CATARACT EXTRACTION W/PHACO Right 12/01/2017   Procedure: CATARACT EXTRACTION PHACO AND INTRAOCULAR LENS PLACEMENT (IOC)  RIGHT DIABETIC;  Surgeon: Nevada Crane, MD;  Location: [facility];  Service: Ophthalmology;  Laterality: Right;  Diabetic - insulin and oral  . CHOLECYSTECTOMY  1983   Family History  Problem Relation Age of Onset  . Diabetes Father   . Kidney disease Father   . Heart disease Father   . Alcohol abuse Mother   . Dementia Mother   . Diabetes Mother   . Diabetes Other   . Hemophilia Brother   . Uterine cancer Sister   . Diabetes Sister   . COPD Sister   . Breast cancer Maternal Aunt 51  . Breast cancer Daughter 69  . Stroke Daughter   . Diabetes Son    Social History  Socioeconomic History  . Marital status: Widowed    Spouse name: Not on file  . Number of children: 4  . Years of education: Not on file  . Highest education level: Not on file  Occupational History  . Not on file  Tobacco Use  . Smoking status: Never Smoker  . Smokeless tobacco: Never Used  Vaping Use  . Vaping Use: Never used  Substance and Sexual Activity  . Alcohol use: No    Alcohol/week: 0.0 standard drinks  . Drug use: No  . Sexual activity: Not Currently  Other Topics Concern  . Not on file  Social History Narrative   Widowed. Has four children. Manager of an apartment building. No tobacco, alcohol or other drug use   Social Determinants of Health   Financial Resource Strain: Medium Risk  .  Difficulty of Paying Living Expenses: Somewhat hard  Food Insecurity: No Food Insecurity  . Worried About Programme researcher, broadcasting/film/video in the Last Year: Never true  . Ran Out of Food in the Last Year: Never true  Transportation Needs: No Transportation Needs  . Lack of Transportation (Medical): No  . Lack of Transportation (Non-Medical): No  Physical Activity: Not on file  Stress: Stress Concern Present  . Feeling of Stress : Very much  Social Connections: Not on file    Outpatient Encounter Medications as of 07/07/2020  Medication Sig  . acetaminophen (TYLENOL) 500 MG tablet Take 500-1,000 mg by mouth every 6 (six) hours as needed for mild pain or moderate pain.   Marland Kitchen amLODipine (NORVASC) 10 MG tablet Take 1 tablet by mouth once daily  . apixaban (ELIQUIS) 5 MG TABS tablet Take 1 tablet (5 mg total) by mouth 2 (two) times daily.  . BD PEN NEEDLE NANO 2ND GEN 32G X 4 MM MISC USE TO INJECT INSULIN 3 TIMES DAILY  . Continuous Blood Gluc Sensor (FREESTYLE LIBRE 2 SENSOR) MISC Use to check sugar at least 4 times daily  . empagliflozin ([name]) 25 MG TABS tablet Take 1 tablet (25 mg total) by mouth daily before breakfast.  . ferrous sulfate 325 (65 FE) MG tablet Take 325 mg by mouth daily with breakfast.  . fluticasone (CUTIVATE) 0.05 % cream Apply 1 application topically as needed. Use as directed.  Marland Kitchen glucose blood (ONETOUCH VERIO) test strip Use as instructed  . Insulin Glargine (BASAGLAR KWIKPEN) 100 UNIT/ML Inject 28 Units into the skin every morning.  . insulin lispro ([name]) 100 UNIT/ML injection Inject 0.04-0.08 mLs (4-8 Units total) into the skin 3 (three) times daily as needed for high blood sugar.  . magnesium oxide (MAG-OX) 400 (241.3 Mg) MG tablet Take 1 tablet by mouth once daily  . metFORMIN (GLUCOPHAGE-XR) 500 MG 24 hr tablet Take 2 tablets by mouth twice daily  . metoprolol tartrate (LOPRESSOR) 25 MG tablet Take 25 mg by mouth 2 (two) times daily.  . Pancrelipase, Lip-Prot-Amyl,  ([name] PO) Take by mouth. Take 36,000-72,000 Units by mouth See admin instructions. Take 3 capsules by mouth three times daily with meals and take 2 capsule by mouth daily with snacks  . pantoprazole (PROTONIX) 40 MG tablet TAKE 1 TABLET BY MOUTH TWICE DAILY BEFORE MEAL(S)  . simvastatin (ZOCOR) 10 MG tablet TAKE 1 TABLET BY MOUTH AT BEDTIME  . tizanidine (ZANAFLEX) 2 MG capsule Take 1 capsule (2 mg total) by mouth daily.  Marland Kitchen triamcinolone (KENALOG) 0.1 % APPLY  CREAM EXTERNALLY TWICE DAILY   No facility-administered encounter medications on file as of  07/07/2020.    Review of Systems  Constitutional: Negative for appetite change and unexpected weight change.  HENT: Negative for congestion and sinus pressure.   Respiratory: Negative for cough and chest tightness.        Breathing stable.   Cardiovascular: Negative for chest pain and palpitations.  Gastrointestinal: Negative for nausea and vomiting.       Abdominal pain better with pancreas enzymes.  Bowel issues persistent.   Genitourinary: Negative for difficulty urinating and dysuria.  Musculoskeletal: Negative for joint swelling and myalgias.  Skin: Negative for color change and rash.  Neurological: Negative for dizziness, light-headedness and headaches.  Psychiatric/Behavioral: Negative for agitation and dysphoric mood.       Objective:    Physical Exam Vitals reviewed.  Constitutional:      General: She is not in acute distress. HENT:     Head: Normocephalic and atraumatic.     Right Ear: External ear normal.     Left Ear: External ear normal.  Eyes:     General: No scleral icterus.       Right eye: No discharge.        Left eye: No discharge.  Neck:     Thyroid: No thyromegaly.  Cardiovascular:     Rate and Rhythm: Normal rate and regular rhythm.  Pulmonary:     Effort: No respiratory distress.     Breath sounds: Normal breath sounds. No wheezing.  Abdominal:     General: Bowel sounds are normal.     Palpations:  Abdomen is soft.     Tenderness: There is no abdominal tenderness.  Musculoskeletal:        General: No tenderness.     Cervical back: Neck supple. No tenderness.     Comments: No increased swelling.   Lymphadenopathy:     Cervical: No cervical adenopathy.  Skin:    Findings: No erythema or rash.  Neurological:     Mental Status: She is alert.  Psychiatric:        Mood and Affect: Mood normal.        Behavior: Behavior normal.     BP 138/74   Pulse 73   Temp 97.6 F (36.4 C) (Oral)   Resp 16   Ht 5\' 2"  (1.575 m)   Wt 188 lb (85.3 kg)   SpO2 98%   BMI 34.39 kg/m  Wt Readings from Last 3 Encounters:  07/07/20 188 lb (85.3 kg)  02/22/20 183 lb (83 kg)  02/19/20 185 lb (83.9 kg)     Lab Results  Component Value Date   WBC 8.9 07/03/2020   HGB 13.2 07/03/2020   HCT 39.5 07/03/2020   PLT 275.0 07/03/2020   GLUCOSE 89 07/03/2020   CHOL 111 07/03/2020   TRIG 82.0 07/03/2020   HDL 54.90 07/03/2020   LDLDIRECT 74.4 02/21/2012   LDLCALC 40 07/03/2020   ALT 17 07/03/2020   AST 14 07/03/2020   NA 145 07/03/2020   K 3.9 07/03/2020   CL 107 07/03/2020   CREATININE 0.83 07/03/2020   BUN 18 07/03/2020   CO2 29 07/03/2020   TSH 1.53 07/03/2020   INR 1.0 03/18/2019   HGBA1C 6.9 (H) 07/03/2020   MICROALBUR 9.0 (H) 07/27/2019       Assessment & Plan:   Problem List Items Addressed This Visit    Aortic atherosclerosis (HCC) - Primary    Continue simvastatin.       Breast cancer screening    She will schedule.  Relevant Orders   MM 3D SCREEN BREAST BILATERAL   Hypercholesterolemia    Continue simvastatin.  Low cholesterol diet and exercise.  Follow lipid panel and liver function tests.       Paroxysmal A-fib [facility](HCC)    Doing well currently. She has noticed occasional increased heart rate - rates approximately - 80s range. On metoprolol - metoprolol tartrate.  Discussed possible change to metoprolol succinate.  Concern regarding previous bradycardia.   Pulse  rate mostly 60s range.  Hold on changing. Blood pressure doing better.  Can use metoprolol tartrate prn.  Follow closely.  Consider change if persistent problem.       Type 2 diabetes mellitus with diabetic chronic kidney disease (HCC)    a1c 6.9.  Much improved.  Discussed current readings and improvement in overall sugar. Having some low sugars at night.  Discussed decreasing humalog in the evening.  Follow.         Other Visit Diagnoses    AF (paroxysmal atrial fibrillation) (HCC)   (Chronic)         Dale Durhamharlene Ravonda Brecheen, MD

## 2020-07-09 ENCOUNTER — Encounter: Payer: Self-pay | Admitting: Internal Medicine

## 2020-07-09 DIAGNOSIS — Z1239 Encounter for other screening for malignant neoplasm of breast: Secondary | ICD-10-CM | POA: Insufficient documentation

## 2020-07-09 NOTE — Assessment & Plan Note (Signed)
Continue simvastatin. 

## 2020-07-09 NOTE — Assessment & Plan Note (Signed)
Continue simvastatin.  Low cholesterol diet and exercise.  Follow lipid panel and liver function tests.   

## 2020-07-09 NOTE — Assessment & Plan Note (Signed)
She will schedule.

## 2020-07-09 NOTE — Assessment & Plan Note (Signed)
Doing well currently. She has noticed occasional increased heart rate - rates approximately - 80s range. On metoprolol - metoprolol tartrate.  Discussed possible change to metoprolol succinate.  Concern regarding previous bradycardia.   Pulse rate mostly 60s range.  Hold on changing. Blood pressure doing better.  Can use metoprolol tartrate prn.  Follow closely.  Consider change if persistent problem.

## 2020-07-09 NOTE — Assessment & Plan Note (Signed)
a1c 6.9.  Much improved.  Discussed current readings and improvement in overall sugar. Having some low sugars at night.  Discussed decreasing humalog in the evening.  Follow.

## 2020-07-12 ENCOUNTER — Other Ambulatory Visit: Payer: Self-pay | Admitting: Internal Medicine

## 2020-08-02 ENCOUNTER — Other Ambulatory Visit: Payer: Self-pay | Admitting: Internal Medicine

## 2020-08-16 ENCOUNTER — Other Ambulatory Visit: Payer: Medicare HMO

## 2020-08-17 ENCOUNTER — Other Ambulatory Visit: Payer: Self-pay | Admitting: Internal Medicine

## 2020-08-17 DIAGNOSIS — E1140 Type 2 diabetes mellitus with diabetic neuropathy, unspecified: Secondary | ICD-10-CM

## 2020-08-17 DIAGNOSIS — Z794 Long term (current) use of insulin: Secondary | ICD-10-CM

## 2020-08-18 ENCOUNTER — Encounter: Payer: Self-pay | Admitting: Internal Medicine

## 2020-08-18 ENCOUNTER — Ambulatory Visit (INDEPENDENT_AMBULATORY_CARE_PROVIDER_SITE_OTHER): Payer: Medicare HMO | Admitting: Internal Medicine

## 2020-08-18 ENCOUNTER — Other Ambulatory Visit: Payer: Self-pay

## 2020-08-18 VITALS — BP 128/68 | HR 67 | Temp 97.2°F | Resp 16 | Ht 62.0 in | Wt 193.0 lb

## 2020-08-18 DIAGNOSIS — I480 Paroxysmal atrial fibrillation: Secondary | ICD-10-CM | POA: Diagnosis not present

## 2020-08-18 DIAGNOSIS — E1122 Type 2 diabetes mellitus with diabetic chronic kidney disease: Secondary | ICD-10-CM | POA: Diagnosis not present

## 2020-08-18 DIAGNOSIS — Z794 Long term (current) use of insulin: Secondary | ICD-10-CM

## 2020-08-18 DIAGNOSIS — E7800 Pure hypercholesterolemia, unspecified: Secondary | ICD-10-CM

## 2020-08-18 DIAGNOSIS — Z0000 Encounter for general adult medical examination without abnormal findings: Secondary | ICD-10-CM | POA: Diagnosis not present

## 2020-08-18 DIAGNOSIS — D6859 Other primary thrombophilia: Secondary | ICD-10-CM

## 2020-08-18 DIAGNOSIS — I10 Essential (primary) hypertension: Secondary | ICD-10-CM | POA: Diagnosis not present

## 2020-08-18 DIAGNOSIS — I700 Atherosclerosis of aorta: Secondary | ICD-10-CM

## 2020-08-18 DIAGNOSIS — N183 Chronic kidney disease, stage 3 unspecified: Secondary | ICD-10-CM | POA: Diagnosis not present

## 2020-08-18 NOTE — Progress Notes (Addendum)
Patient ID: Beth Tyler, female   DOB: 06/21/1945, 75 y.o.   MRN: [id number]   Subjective:    Patient ID: Beth Tyler, female    DOB: 08-06-1945, 75 y.o.   MRN: [id number]  HPI This visit occurred during the SARS-CoV-2 public health emergency.  Safety protocols were in place, including screening questions prior to the visit, additional usage of staff PPE, and extensive cleaning of exam room while observing appropriate contact time as indicated for disinfecting solutions.  Patient here for her physical exam.  She is doing better.  Feels better.  Sugars better.  Last a1c 6.9.  No chest pain.  Breathing stable.  No increased cough or congestion.  No increased acid reflux reported.  No increased abdominal pain.  Bowels stable.  Due next year for colonoscopy.  Mammogram scheduled.  Lower extremity swelling better.  Handling stress.    Past Medical History:  Diagnosis Date  . Anemia   . Arthritis   . B12 deficiency   . Carpal tunnel syndrome, bilateral   . COVID-19 03/18/2019  . Diabetes mellitus ([place]) 02/17/2012  . Diabetes mellitus, type II ([place])   . Dyspnea   . Gastritis   . GERD (gastroesophageal reflux disease)   . Hypercholesterolemia   . Hypertension   . IBS (irritable bowel syndrome)   . Neuropathy    legs  . Pernicious anemia   . Vertigo    no episodes fo 9 or 10 yrs   Past Surgical History:  Procedure Laterality Date  . ANTERIOR VITRECTOMY Right 12/01/2017   Procedure: ANTERIOR VITRECTOMY;  Surgeon: Eulogio Bear, MD;  Location: [place];  Service: Ophthalmology;  Laterality: Right;  . BREAST BIOPSY Right 1980   neg  . BREAST SURGERY  1980   benign cyst removal  . CATARACT EXTRACTION W/PHACO Left 10/13/2017   Procedure: CATARACT EXTRACTION PHACO AND INTRAOCULAR LENS PLACEMENT ([place])  LEFT  DIABETES;  Surgeon: Eulogio Bear, MD;  Location: [place];  Service: Ophthalmology;  Laterality: Left;  Diabetic - insulin and oral meds  .  CATARACT EXTRACTION W/PHACO Right 12/01/2017   Procedure: CATARACT EXTRACTION PHACO AND INTRAOCULAR LENS PLACEMENT ([place])  RIGHT DIABETIC;  Surgeon: Eulogio Bear, MD;  Location: [place];  Service: Ophthalmology;  Laterality: Right;  Diabetic - insulin and oral  . CHOLECYSTECTOMY  1983   Family History  Problem Relation Age of Onset  . Diabetes Father   . Kidney disease Father   . Heart disease Father   . Alcohol abuse Mother   . Dementia Mother   . Diabetes Mother   . Diabetes Other   . Hemophilia Brother   . Uterine cancer Sister   . Diabetes Sister   . COPD Sister   . Breast cancer Maternal Aunt 51  . Breast cancer Daughter 13  . Stroke Daughter   . Diabetes Son    Social History   Socioeconomic History  . Marital status: Widowed    Spouse name: Not on file  . Number of children: 4  . Years of education: Not on file  . Highest education level: Not on file  Occupational History  . Not on file  Tobacco Use  . Smoking status: Never Smoker  . Smokeless tobacco: Never Used  Vaping Use  . Vaping Use: Never used  Substance and Sexual Activity  . Alcohol use: No    Alcohol/week: 0.0 standard drinks  . Drug use: No  . Sexual activity: Not Currently  Other Topics Concern  . Not on file  Social History Narrative   Widowed. Has four children. Manager of an apartment building. No tobacco, alcohol or other drug use   Social Determinants of Health   Financial Resource Strain: Medium Risk  . Difficulty of Paying Living Expenses: Somewhat hard  Food Insecurity: No Food Insecurity  . Worried About Charity fundraiser in the Last Year: Never true  . Ran Out of Food in the Last Year: Never true  Transportation Needs: No Transportation Needs  . Lack of Transportation (Medical): No  . Lack of Transportation (Non-Medical): No  Physical Activity: Not on file  Stress: Stress Concern Present  . Feeling of Stress : Very much  Social Connections: Not on file     . Outpatient Encounter Medications as of 08/18/2020  Medication Sig  . acetaminophen (TYLENOL) 500 MG tablet Take 500-1,000 mg by mouth every 6 (six) hours as needed for mild pain or moderate pain.   Marland Kitchen amLODipine (NORVASC) 10 MG tablet Take 1 tablet by mouth once daily  . apixaban (ELIQUIS) 5 MG TABS tablet Take 1 tablet (5 mg total) by mouth 2 (two) times daily.  . BD PEN NEEDLE NANO 2ND GEN 32G X 4 MM MISC USE TO INJECT INSULIN 3 TIMES DAILY  . Continuous Blood Gluc Sensor (FREESTYLE LIBRE 2 SENSOR) MISC Use to check sugar at least 4 times daily  . empagliflozin ([name]) 25 MG TABS tablet Take 1 tablet (25 mg total) by mouth daily before breakfast.  . ferrous sulfate 325 (65 FE) MG tablet Take 325 mg by mouth daily with breakfast.  . fluticasone (CUTIVATE) 0.05 % cream Apply 1 application topically as needed. Use as directed.  Marland Kitchen glucose blood (ONETOUCH VERIO) test strip Use as instructed  . Insulin Glargine (BASAGLAR KWIKPEN) 100 UNIT/ML Inject 28 Units into the skin every morning.  . insulin lispro ([name]) 100 UNIT/ML injection Inject 0.04-0.08 mLs (4-8 Units total) into the skin 3 (three) times daily as needed for high blood sugar.  . magnesium oxide (MAG-OX) 400 (241.3 Mg) MG tablet Take 1 tablet by mouth once daily  . metFORMIN (GLUCOPHAGE-XR) 500 MG 24 hr tablet Take 2 tablets by mouth twice daily  . metoprolol tartrate (LOPRESSOR) 25 MG tablet Take 25 mg by mouth 2 (two) times daily.  . Pancrelipase, Lip-Prot-Amyl, ([name] PO) Take by mouth. Take 36,000-72,000 Units by mouth See admin instructions. Take 3 capsules by mouth three times daily with meals and take 2 capsule by mouth daily with snacks  . pantoprazole (PROTONIX) 40 MG tablet TAKE 1 TABLET BY MOUTH TWICE DAILY BEFORE MEAL(S)  . simvastatin (ZOCOR) 10 MG tablet TAKE 1 TABLET BY MOUTH AT BEDTIME  . tizanidine (ZANAFLEX) 2 MG capsule Take 1 capsule (2 mg total) by mouth daily.  Marland Kitchen triamcinolone (KENALOG) 0.1 % APPLY  CREAM  EXTERNALLY TWICE DAILY   No facility-administered encounter medications on file as of 08/18/2020.    Review of Systems  Constitutional: Negative for appetite change and unexpected weight change.  HENT: Negative for congestion, sinus pressure and sore throat.   Eyes: Negative for pain and visual disturbance.  Respiratory: Negative for cough, chest tightness and shortness of breath.   Cardiovascular: Negative for chest pain and palpitations.       Leg swelling improved.    Gastrointestinal: Negative for abdominal pain, diarrhea and vomiting.  Genitourinary: Negative for difficulty urinating and dysuria.  Musculoskeletal: Negative for joint swelling and myalgias.  Skin: Negative for color  change and rash.  Neurological: Negative for dizziness, light-headedness and headaches.  Hematological: Negative for adenopathy. Does not bruise/bleed easily.  Psychiatric/Behavioral: Negative for agitation and dysphoric mood.       Objective:    Physical Exam Vitals reviewed.  Constitutional:      General: She is not in acute distress.    Appearance: Normal appearance. She is well-developed.  HENT:     Head: Normocephalic and atraumatic.     Right Ear: External ear normal.     Left Ear: External ear normal.  Eyes:     General: No scleral icterus.       Right eye: No discharge.        Left eye: No discharge.     Conjunctiva/sclera: Conjunctivae normal.  Neck:     Thyroid: No thyromegaly.  Cardiovascular:     Rate and Rhythm: Normal rate and regular rhythm.  Pulmonary:     Effort: No tachypnea, accessory muscle usage or respiratory distress.     Breath sounds: Normal breath sounds. No decreased breath sounds or wheezing.  Chest:  Breasts:     Right: No inverted nipple, mass, nipple discharge or tenderness (no axillary adenopathy).     Left: No inverted nipple, mass, nipple discharge or tenderness (no axilarry adenopathy).    Abdominal:     General: Bowel sounds are normal.      Palpations: Abdomen is soft.     Tenderness: There is no abdominal tenderness.  Musculoskeletal:        General: No tenderness.     Cervical back: Neck supple. No tenderness.     Comments: Lower extremity swelling - improved.    Lymphadenopathy:     Cervical: No cervical adenopathy.  Skin:    Findings: No erythema or rash.  Neurological:     Mental Status: She is alert and oriented to person, place, and time.  Psychiatric:        Mood and Affect: Mood normal.        Behavior: Behavior normal.     BP 128/68   Pulse 67   Temp (!) 97.2 F (36.2 C) (Temporal)   Resp 16   Ht 5' 2"  (1.575 m)   Wt 193 lb (87.5 kg)   SpO2 97%   BMI 35.30 kg/m  Wt Readings from Last 3 Encounters:  08/18/20 193 lb (87.5 kg)  07/07/20 188 lb (85.3 kg)  02/22/20 183 lb (83 kg)     Lab Results  Component Value Date   WBC 8.9 07/03/2020   HGB 13.2 07/03/2020   HCT 39.5 07/03/2020   PLT 275.0 07/03/2020   GLUCOSE 89 07/03/2020   CHOL 111 07/03/2020   TRIG 82.0 07/03/2020   HDL 54.90 07/03/2020   LDLDIRECT 74.4 02/21/2012   LDLCALC 40 07/03/2020   ALT 17 07/03/2020   AST 14 07/03/2020   NA 145 07/03/2020   K 3.9 07/03/2020   CL 107 07/03/2020   CREATININE 0.83 07/03/2020   BUN 18 07/03/2020   CO2 29 07/03/2020   TSH 1.53 07/03/2020   INR 1.0 03/18/2019   HGBA1C 6.9 (H) 07/03/2020   MICROALBUR 9.0 (H) 07/27/2019       Assessment & Plan:   Problem List Items Addressed This Visit    Aortic atherosclerosis ([place])    Continue simvastatin.       Benign essential hypertension    Continue metoprolol and amlodipine.  Blood pressure doing well.  Follow pressures.  No changes made.  Follow metabolic  panel.       Healthcare maintenance    Physical today 08/18/20.  Mammogram scheduled for 08/22/20.  Colonoscopy 2013 - <46m polyp in rectum and diverticulosis.  Per report, due next year f/u.       Hypercholesterolemia    Continue simvastatin.  Low cholesterol diet and exercise.  Follow  lipid panel and liver function tests.       Relevant Orders   Hepatic function panel   Lipid panel   Paroxysmal A-fib (HCC)    No increased heart rate or palpitations reported.  On metoprolol.  Overall stable.  Follow.       Thrombophilia ([place]    afib on eliquis.  Continue eliquis.  Heart rate doing well.  Follow.       Type 2 diabetes mellitus with diabetic chronic kidney disease ([place]    a1c on recent check - 6.9.  Continues on jardiance, metformin and humalog.  Doing well.  No significant problems currently with low sugars.  Follow met b and a1c.  Low carb diet and exercise.        Relevant Orders   Hemoglobin AQ0H  Basic metabolic panel   Microalbumin / creatinine urine ratio    Other Visit Diagnoses    Routine general medical examination at a health care facility    -  Primary       CEinar Pheasant MD

## 2020-08-22 ENCOUNTER — Other Ambulatory Visit: Payer: Self-pay

## 2020-08-22 ENCOUNTER — Ambulatory Visit
Admission: RE | Admit: 2020-08-22 | Discharge: 2020-08-22 | Disposition: A | Discharge status: Home / Self Care | Payer: Medicare HMO | Source: Ambulatory Visit | Attending: Internal Medicine | Admitting: Internal Medicine

## 2020-08-22 DIAGNOSIS — Z1231 Encounter for screening mammogram for malignant neoplasm of breast: Secondary | ICD-10-CM | POA: Diagnosis not present

## 2020-08-23 ENCOUNTER — Encounter: Payer: Self-pay | Admitting: Internal Medicine

## 2020-08-23 DIAGNOSIS — Z0000 Encounter for general adult medical examination without abnormal findings: Secondary | ICD-10-CM | POA: Insufficient documentation

## 2020-08-27 ENCOUNTER — Encounter: Payer: Self-pay | Admitting: Internal Medicine

## 2020-08-27 DIAGNOSIS — D6859 Other primary thrombophilia: Secondary | ICD-10-CM | POA: Insufficient documentation

## 2020-08-27 NOTE — Assessment & Plan Note (Signed)
Continue simvastatin.  Low cholesterol diet and exercise.  Follow lipid panel and liver function tests.   

## 2020-08-27 NOTE — Assessment & Plan Note (Signed)
Continue simvastatin. 

## 2020-08-27 NOTE — Assessment & Plan Note (Signed)
Physical today 08/18/20.  Mammogram scheduled for 08/22/20.  Colonoscopy 2013 - <45mm polyp in rectum and diverticulosis.  Per report, due next year f/u.

## 2020-08-27 NOTE — Assessment & Plan Note (Signed)
Continue metoprolol and amlodipine.  Blood pressure doing well.  Follow pressures.  No changes made.  Follow metabolic panel.

## 2020-08-27 NOTE — Assessment & Plan Note (Signed)
a1c on recent check - 6.9.  Continues on jardiance, metformin and humalog.  Doing well.  No significant problems currently with low sugars.  Follow met b and a1c.  Low carb diet and exercise.   

## 2020-08-27 NOTE — Assessment & Plan Note (Signed)
No increased heart rate or palpitations reported.  On metoprolol.  Overall stable.  Follow.

## 2020-08-27 NOTE — Assessment & Plan Note (Signed)
afib on eliquis.  Continue eliquis.  Heart rate doing well.  Follow.

## 2020-09-07 ENCOUNTER — Telehealth: Payer: Self-pay

## 2020-09-07 NOTE — Chronic Care Management (AMB) (Signed)
  Care Management   Note  09/07/2020 Name: [name] MRN: [id number] DOB: 07-02-1945  Beth Tyler is a 75 y.o. year old female who is a primary care patient of Dale [place], MD and is actively engaged with the care management team. I reached out to Beth Tyler by phone today to assist with re-scheduling a follow up visit with the Pharmacist  Follow up plan: Unsuccessful telephone outreach attempt made. A HIPAA compliant phone message was left for the patient providing contact information and requesting a return call.  The care management team will reach out to the patient again over the next 5 days.  If patient returns call to provider office, please advise to call Embedded Care Management Care Guide Penne Lash  at [phone]  Penne Lash, RMA Care Guide, Embedded Care Coordination [facility]  [place], [place] [address] Direct Dial: [phone] [email or web address]: [email or web address]

## 2020-09-08 NOTE — Chronic Care Management (AMB) (Signed)
  Care Management   Note  09/08/2020 Name: CAYCEE WANAT MRN: [id number] DOB: [birth year]  Beth Tyler is a 75 y.o. year old female who is a primary care patient of Dale Arnold, MD and is actively engaged with the care management team. I reached out to Beth Tyler by phone today to assist with re-scheduling a follow up visit with the Pharmacist  Follow up plan: Telephone appointment with care management team member scheduled for:09/19/2020 @02 :30pm  , RMA Care Guide, Embedded Care Coordination [facility]  [place], [place] Direct Dial: [phone] [email or web address]

## 2020-09-19 ENCOUNTER — Ambulatory Visit (INDEPENDENT_AMBULATORY_CARE_PROVIDER_SITE_OTHER): Payer: Medicare HMO | Admitting: Pharmacist

## 2020-09-19 DIAGNOSIS — E7800 Pure hypercholesterolemia, unspecified: Secondary | ICD-10-CM

## 2020-09-19 DIAGNOSIS — I700 Atherosclerosis of aorta: Secondary | ICD-10-CM

## 2020-09-19 DIAGNOSIS — Z794 Long term (current) use of insulin: Secondary | ICD-10-CM | POA: Diagnosis not present

## 2020-09-19 DIAGNOSIS — N183 Chronic kidney disease, stage 3 unspecified: Secondary | ICD-10-CM

## 2020-09-19 DIAGNOSIS — I480 Paroxysmal atrial fibrillation: Secondary | ICD-10-CM

## 2020-09-19 DIAGNOSIS — I10 Essential (primary) hypertension: Secondary | ICD-10-CM

## 2020-09-19 DIAGNOSIS — E1140 Type 2 diabetes mellitus with diabetic neuropathy, unspecified: Secondary | ICD-10-CM

## 2020-09-19 MED ORDER — BASAGLAR KWIKPEN 100 UNIT/ML ~~LOC~~ SOPN: 28.0000 [IU] | PEN_INJECTOR | Freq: Every morning | SUBCUTANEOUS | Status: DC

## 2020-09-19 MED ORDER — HUMALOG 100 UNIT/ML ~~LOC~~ SOLN: 4.0000 [IU] | Freq: Three times a day (TID) | SUBCUTANEOUS | Status: DC | PRN

## 2020-09-19 NOTE — Chronic Care Management (AMB) (Signed)
Chronic Care Management Pharmacy Note  09/19/2020 Name:  Beth Tyler MRN:  [id number] DOB:  1946-03-21  Subjective: Beth Tyler is an 75 y.o. year old female who is a primary patient of Einar Pheasant, MD.  The CCM team was consulted for assistance with disease management and care coordination needs.    Engaged with patient by telephone for follow up visit in response to provider referral for pharmacy case management and/or care coordination services.   Consent to Services:  The patient was given information about Chronic Care Management services, agreed to services, and gave verbal consent prior to initiation of services.  Please see initial visit note for detailed documentation.   Patient Care Team: Einar Pheasant, MD as PCP - General (Internal Medicine) De Hollingshead, RPH-CPP as Pharmacist (Pharmacist)  Recent office visits: 4/1 -PCP  - f/u on BP, continue current regimen 5/13 - PCP - A1c 6.9%   Objective:  Lab Results  Component Value Date   CREATININE 0.83 07/03/2020   CREATININE 0.96 02/07/2020   CREATININE 1.07 (H) 12/06/2019    Lab Results  Component Value Date   HGBA1C 6.9 (H) 07/03/2020   Last diabetic Eye exam:  Lab Results  Component Value Date/Time   HMDIABEYEEXA No Retinopathy 02/04/2020 12:00 AM    Last diabetic Foot exam:  Lab Results  Component Value Date/Time   HMDIABFOOTEX on my exam 07/07/2020 12:00 AM        Component Value Date/Time   CHOL 111 07/03/2020 1026   TRIG 82.0 07/03/2020 1026   HDL 54.90 07/03/2020 1026   CHOLHDL 2 07/03/2020 1026   VLDL 16.4 07/03/2020 1026   LDLCALC 40 07/03/2020 1026   LDLDIRECT 74.4 02/21/2012 1006    Hepatic Function Latest Ref Rng & Units 07/03/2020 02/07/2020 07/27/2019  Total Protein 6.0 - 8.3 g/dL 6.8 7.2 7.1  Albumin 3.5 - 5.2 g/dL 4.6 4.5 4.5  AST 0 - 37 U/L 14 15 15   ALT 0 - 35 U/L 17 15 12   Alk Phosphatase 39 - 117 U/L 84 90 73  Total Bilirubin 0.2 - 1.2 mg/dL 0.5 0.7 0.5   Bilirubin, Direct 0.0 - 0.3 mg/dL 0.2 0.2 0.1    Lab Results  Component Value Date/Time   TSH 1.53 07/03/2020 10:26 AM   TSH 1.514 04/27/2019 04:03 PM   TSH 0.914 04/05/2019 06:36 AM   TSH 2.70 09/10/2018 10:23 AM   FREET4 1.53 (H) 04/04/2019 04:03 PM    CBC Latest Ref Rng & Units 07/03/2020 02/07/2020 12/06/2019  WBC 4.0 - 10.5 K/uL 8.9 10.0 11.2(H)  Hemoglobin 12.0 - 15.0 g/dL 13.2 13.7 13.6  Hematocrit 36.0 - 46.0 % 39.5 41.7 40.9  Platelets 150.0 - 400.0 K/uL 275.0 264.0 312     Clinical ASCVD: No    CHA2DS2-VASc Score = 5  This indicates a 7.2% annual risk of stroke. The patient's score is based upon: CHF History: No HTN History: Yes Diabetes History: Yes Stroke History: No Vascular Disease History: No Age Score: 2 Gender Score: 1   Social History   Tobacco Use  Smoking Status Never  Smokeless Tobacco Never   BP Readings from Last 3 Encounters:  08/27/20 128/68  07/07/20 138/74  02/22/20 127/68   Pulse Readings from Last 3 Encounters:  08/18/20 67  07/07/20 73  02/22/20 68   Wt Readings from Last 3 Encounters:  08/18/20 193 lb (87.5 kg)  07/07/20 188 lb (85.3 kg)  02/22/20 183 lb (83 kg)  Assessment: Review of patient past medical history, allergies, medications, health status, including review of consultants reports, laboratory and other test data, was performed as part of comprehensive evaluation and provision of chronic care management services.   SDOH:  (Social Determinants of Health) assessments and interventions performed:  SDOH Interventions    Flowsheet Row Most Recent Value  SDOH Interventions   Financial Strain Interventions Other (Comment)  [manufacturer assistance]       CCM Care Plan  Allergies  Allergen Reactions   Hydrocodone Anaphylaxis   Hydrocodone-Acetaminophen Anaphylaxis   Oxycodone-Acetaminophen Anaphylaxis and Other (See Comments)   Percocet [Oxycodone-Acetaminophen] Anaphylaxis   Demeclocycline Nausea Only    Other Nausea Only and Other (See Comments)   Penicillins Nausea Only    Did it involve swelling of the face/tongue/throat, SOB, or low BP? No Did it involve sudden or severe rash/hives, skin peeling, or any reaction on the inside of your mouth or nose? No Did you need to seek medical attention at a hospital or doctor's office? No When did it last happen?       If all above answers are "NO", may proceed with cephalosporin use.   Tetracyclines & Related Nausea Only    Medications Reviewed Today     Reviewed by Einar Pheasant, MD (Physician) on 08/27/20 at 1054  Med List Status: <None>   Medication Order Taking? Sig Documenting Provider Last Dose Status Informant  acetaminophen (TYLENOL) 500 MG tablet [id number] No Take 500-1,000 mg by mouth every 6 (six) hours as needed for mild pain or moderate pain.  [provider] Taking Active Self  amLODipine (NORVASC) 10 MG tablet [id number]  Take 1 tablet by mouth once daily Einar Pheasant, MD  Active   apixaban (ELIQUIS) 5 MG TABS tablet [id number] No Take 1 tablet (5 mg total) by mouth 2 (two) times daily. Einar Pheasant, MD Taking Active   BD PEN NEEDLE NANO 2ND GEN 32G X 4 MM MISC [id number]  USE TO INJECT INSULIN 3 TIMES DAILY Einar Pheasant, MD  Active   Continuous Blood Gluc Sensor (FREESTYLE LIBRE 2 SENSOR) [place] [id number] No Use to check sugar at least 4 times daily Einar Pheasant, MD Taking Active   empagliflozin ([name]) 25 MG TABS tablet [id number] No Take 1 tablet (25 mg total) by mouth daily before breakfast. Einar Pheasant, MD Taking Active   ferrous sulfate 325 (65 FE) MG tablet [id number] No Take 325 mg by mouth daily with breakfast. [provider] Taking Active   fluticasone (CUTIVATE) 0.05 % cream [id number]  Apply 1 application topically as needed. Use as directed. Einar Pheasant, MD  Active   glucose blood [facility] VERIO) test strip [id number]  Use as instructed Einar Pheasant, MD  Active   Insulin Glargine  [facility] KWIKPEN) 100 UNIT/ML [id number] No Inject 28 Units into the skin every morning. Einar Pheasant, MD Taking Active            Med Note Mayo Ao Mar 20, 2020  3:14 PM) 25 units  insulin lispro ([name]) 100 UNIT/ML injection [id number] No Inject 0.04-0.08 mLs (4-8 Units total) into the skin 3 (three) times daily as needed for high blood sugar. Einar Pheasant, MD Taking Active            Med Note Darnelle Maffucci, Arville Lime   Wed Jul 05, 2020 11:27 AM) 6-8 units three times daily with meals  magnesium oxide (MAG-OX) 400 (241.3 Mg) MG tablet [id number]  Take 1 tablet by mouth once daily Scott,  Randell Patient, MD  Active   metFORMIN (GLUCOPHAGE-XR) 500 MG 24 hr tablet [id number] No Take 2 tablets by mouth twice daily Einar Pheasant, MD Taking Active   metoprolol tartrate (LOPRESSOR) 25 MG tablet [id number] No Take 25 mg by mouth 2 (two) times daily. [provider] Taking Active   Pancrelipase, Lip-Prot-Amyl, ([name] PO) [id number] No Take by mouth. Take 36,000-72,000 Units by mouth See admin instructions. Take 3 capsules by mouth three times daily with meals and take 2 capsule by mouth daily with snacks [provider] Taking Active   pantoprazole (PROTONIX) 40 MG tablet [id number]  TAKE 1 TABLET BY MOUTH TWICE DAILY BEFORE MEAL(S) Einar Pheasant, MD  Active   simvastatin (ZOCOR) 10 MG tablet [id number]  TAKE 1 TABLET BY MOUTH AT BEDTIME Einar Pheasant, MD  Active   tizanidine (ZANAFLEX) 2 MG capsule [id number] No Take 1 capsule (2 mg total) by mouth daily. Einar Pheasant, MD Taking Active   triamcinolone (KENALOG) 0.1 % [id number] No APPLY  CREAM EXTERNALLY TWICE DAILY Einar Pheasant, MD Taking Active             Patient Active Problem List   Diagnosis Date Noted   Thrombophilia ([place]) 08/27/2020   Healthcare maintenance 08/23/2020   Breast cancer screening 07/09/2020   Hypercholesterolemia 08/07/2019   Change in bowel movement 08/07/2019   Benign essential  hypertension 04/06/2019   Type 2 diabetes mellitus with diabetic chronic kidney disease ([place]) 04/06/2019   Paroxysmal A-fib ([place]) 04/06/2019   Aortic atherosclerosis ([place]) 10/22/2016   Back pain 05/23/2015   Cough 02/13/2015   Colon cancer screening 01/19/2014    Immunization History  Administered Date(s) Administered   Fluad Quad(high Dose 65+) 12/21/2018, 01/28/2020   Influenza Split 02/04/2012   Influenza, High Dose Seasonal PF 12/19/2015, 12/17/2016, 01/08/2018   Influenza,inj,Quad PF,6+ Mos 01/22/2013, 01/19/2014, 12/29/2014   Moderna Sars-Covid-2 Vaccination 07/07/2019   Pneumococcal Conjugate-13 02/06/2010, 06/11/2013   Pneumococcal Polysaccharide-23 02/11/2018    Conditions to be addressed/monitored: Atrial Fibrillation, HTN, HLD, and DMII  Care Plan : Medication Management  Updates made by De Hollingshead, RPH-CPP since 09/19/2020 12:00 AM     Problem: Diabetes, CKD, IBS      Long-Range Goal: Disease Progression Prevention   This Visit's Progress: On track  Recent Progress: On track  Priority: High  Note:   Current Barriers:  Unable to independently afford treatment regimen Unable to achieve control of diabetes   Pharmacist Clinical Goal(s):  Over the next 90 days, patient will verbalize ability to afford treatment regimen. Over the next 90 days, patient will achieve control of diabetes as evidenced by improvement in A1c through collaboration with PharmD and provider.   Interventions: 1:1 collaboration with Einar Pheasant, MD regarding development and update of comprehensive plan of care as evidenced by provider attestation and co-signature Inter-disciplinary care team collaboration (see longitudinal plan of care) Comprehensive medication review performed; medication list updated in electronic medical record  Health Maintenance: Discussed COVID booster vaccine. Patient notes she is concerned about receiving another dose, but knows that she should.  Encouraged to schedule. Discussed Shingrix vaccine. Encouraged to schedule at her local pharmacy.  Diabetes: CONTROLLED; current treatment: metformin XR 1000 mg BID, [name] 25 mg daily, Basaglar 25 units daily, [name] 6-8 units up to three times daily with meals Avoiding GLP1 d/t concurrent IBS-D, pancreatic enzyme insufficiency and baseline GI concerns Most recent weight: 189 lbs Approved for assistance for Basaglar, [name], and [name] through 2022 Current glucose readings: using Libre 2 CGM, paying  cash price Scientist, clinical (histocompatibility and immunogenetics) coverage was more expensive) Date of Download: past 14 days 14 day Average Glucose: 143 mg/dL - Midnight to 6 am: 128 - 6 am to 12 pm: 111 - 12 pm to 6 pm: 175 - 6 pm to midnight: 165 Time in Goal:  - Time in range 70-180: 78%  - Time above range: 22% - Time below range: 0% Current meal patterns: breakfast (9-10 am): eggs, occasional leftovers; lunch: sometimes skips, especially since warmer outside; supper: variable, broccoli casserole, avoids fried foods, vegetables; shrimp/fish; drink: splenda tea; occasional coffee if she isn't going anywhere.  Reviewed Time In Target goals. Praised for attainment of goal A1c.  Continue current regimen at this time. Continue use of CGM.  Patient interested in [place] Diet. Discussed long term evidence. Refills for Basaglar and [name] sent today to [name]. Patient will call them later this week to process refill and will let me know if any issues.  Hypertension/Atrial Fibrillation: Appropriately managed; current rate control: metoprolol tartrate 25 mg BID; anticoagulant treatment: Eliquis 5 mg BID; additional antihypertensive regimen: amlodipine 10 mg daily; follows w/ cardiology Dr. Saralyn Pilar Home BP readings: generally ~126/76; occasional evening elevations to 140s/80 HR 55 Denies any recent issues with palpitations in the past 2-3 months Recommended to continue current regimen.  Patient thinks she may  be close to meeting out of pocket spend for Eliquis. Will collaborate w/ CPhT to mail patient her portion of [employer]qius application. Patient will get her out of pocket spend total from the pharmacy.   Hyperlipidemia: Controlled per last lipid panel; current treatment: simvastatin 10 mg daily  Continue current regimen at this time  IBS w/ pancreatic enzyme insufficiency: Appropriately managed; Current regimen: [name] 3 capsules with meals and 2 with snacks. Follows w/ [facility] Re-enrolled for [name] assistance for 2022. Reports symptoms are generally well controlled at this time Continue current regimen at this time. Continue collaboration w/ GI. Due for GI f/u. Encouraged to schedule  GERD: Managed; current regimen: pantoprazole 40 mg twice daily; follows w/ [place] Per last visit, stable on current regimen. Recommend evaluating if BID PPI therapy is required moving forward.  Due for GI f/u. Encouraged to schedule  Supplement: Ferrous sulfate 325 mg daily  Patient Goals/Self-Care Activities Over the next 90 days, patient will:  - take medications as prescribed check blood glucose at least three times daily using CGM, document, and provide at future appointments collaborate with provider on medication access solutions  Follow Up Plan: Telephone follow up appointment with care management team member scheduled for: ~ 12 weeks      Medication Assistance:  [place], [name] obtained through [employer] medication assistance program.  Enrollment ends 04/07/21.  [name] obtained through [name] medication assistance program.  Enrollment ends 04/07/21. Eliquis application from [place] in process  Patient's preferred pharmacy is:  [facility] [place], [place] - [address] [place] [address] Phone: [phone] Fax: [phone]  [facility] [place], [place] - [address] [place] [address] Phone: [phone] Fax: [phone]  Follow Up:  Patient agrees to Care Plan and Follow-up.   Plan: Telephone follow up appointment with care management team member scheduled for:  12 weeks  Catie Darnelle Maffucci, PharmD, [place], [place] Clinical Pharmacist [employer] at [employer] [phone]

## 2020-09-19 NOTE — Patient Instructions (Addendum)
Beth Tyler,   It was great to talk to you today!  1) We recommend you get your second dose of the COVID vaccine, either Pfizer or Moderna, at your local pharmacy 2) We recommend you get the Shingrix vaccination series at your local pharmacy.  3) I've asked Noreene Larsson to mail you your portions of the Eliquis patient assistance application.   4) It looks like you are due for follow up with [facility]  Take care! Happy early birthday.   Catie Feliz Beam, PharmD [phone]  Visit Information  PATIENT GOALS:  Goals Addressed               This Visit's Progress     Patient Stated     Medication Monitoring (pt-stated)        Patient Goals/Self-Care Activities Over the next 90 days, patient will:  - take medications as prescribed check blood glucose three times daily using CGM, document, and provide at future appointments collaborate with provider on medication access solutions          Patient verbalizes understanding of instructions provided today and agrees to view in MyChart.   Plan: Telephone follow up appointment with care management team member scheduled for:  12 weeks  Catie Feliz Beam, PharmD, [place], CPP Clinical Pharmacist [employer] at [employer] [phone]

## 2020-09-26 ENCOUNTER — Telehealth: Payer: Self-pay | Admitting: Pharmacy Technician

## 2020-09-26 DIAGNOSIS — Z596 Low income: Secondary | ICD-10-CM

## 2020-09-26 NOTE — Progress Notes (Signed)
[facility])                                            [facility] Quality Pharmacy Team    09/26/2020  Beth Tyler 08-May-1945 [id number]                                      Medication Assistance Referral  Referral From: [facility] Embedded RPh Catie T.   Medication/Company: Eliquis / [employer] Patient application portion:  Mailed Provider application portion:  N/A Embedded PharmD had signed in clinic  to Dr. Dale Angola Provider address/fax verified via: Office website    .  Beth Tyler P. Beth Tyler, CPhT [facility] [employer]  [phone]

## 2020-10-02 ENCOUNTER — Other Ambulatory Visit: Payer: Self-pay | Admitting: Internal Medicine

## 2020-10-02 DIAGNOSIS — Z794 Long term (current) use of insulin: Secondary | ICD-10-CM

## 2020-10-02 DIAGNOSIS — E1140 Type 2 diabetes mellitus with diabetic neuropathy, unspecified: Secondary | ICD-10-CM

## 2020-10-20 ENCOUNTER — Other Ambulatory Visit: Payer: Medicare HMO

## 2020-10-24 ENCOUNTER — Telehealth: Payer: Self-pay | Admitting: Pharmacy Technician

## 2020-10-24 DIAGNOSIS — Z596 Low income: Secondary | ICD-10-CM

## 2020-10-24 DIAGNOSIS — I890 Lymphedema, not elsewhere classified: Secondary | ICD-10-CM | POA: Diagnosis not present

## 2020-10-24 DIAGNOSIS — I480 Paroxysmal atrial fibrillation: Secondary | ICD-10-CM | POA: Diagnosis not present

## 2020-10-24 DIAGNOSIS — E7800 Pure hypercholesterolemia, unspecified: Secondary | ICD-10-CM | POA: Diagnosis not present

## 2020-10-24 DIAGNOSIS — I10 Essential (primary) hypertension: Secondary | ICD-10-CM | POA: Diagnosis not present

## 2020-10-24 NOTE — Progress Notes (Signed)
[facility])                                            [facility] Quality Pharmacy Team    10/24/2020  Beth Tyler 1945/05/27 [id number]   Received both patient and provider portion(s) of patient assistance application(s) for Eliquis. Faxed completed application and required documents into BMS.    Rueben Kassim P. Anabelen Kaminsky, CPhT [facility] [employer]  [phone]

## 2020-10-25 ENCOUNTER — Other Ambulatory Visit: Payer: Self-pay | Admitting: Internal Medicine

## 2020-10-28 ENCOUNTER — Other Ambulatory Visit: Payer: Self-pay | Admitting: Internal Medicine

## 2020-10-28 DIAGNOSIS — Z794 Long term (current) use of insulin: Secondary | ICD-10-CM

## 2020-10-28 DIAGNOSIS — E1140 Type 2 diabetes mellitus with diabetic neuropathy, unspecified: Secondary | ICD-10-CM

## 2020-10-31 ENCOUNTER — Other Ambulatory Visit: Payer: Self-pay | Admitting: Internal Medicine

## 2020-11-01 ENCOUNTER — Telehealth: Payer: Self-pay | Admitting: Pharmacy Technician

## 2020-11-01 DIAGNOSIS — Z596 Low income: Secondary | ICD-10-CM

## 2020-11-01 NOTE — Progress Notes (Signed)
[facility])                                            [facility] Quality Pharmacy Team    11/01/2020  Beth Tyler [birth year] [id number]  Care coordination call placed to [employer] in regards to Eliquis application.  Spoke to [place] who informed patient was APPROVED 10/25/20-04/07/21. She informed a 90 days supply of medication was delivered to patient's home on 10/27/20. She informed medication would ship out automatically every 90 days. Confirmed with patient medication was received.  Kannen Moxey P. Maicy Filip, CPhT [facility] [employer]  [phone]

## 2020-11-03 ENCOUNTER — Other Ambulatory Visit: Payer: Medicare HMO

## 2020-11-07 ENCOUNTER — Other Ambulatory Visit: Payer: Self-pay

## 2020-11-07 ENCOUNTER — Other Ambulatory Visit (INDEPENDENT_AMBULATORY_CARE_PROVIDER_SITE_OTHER): Payer: Medicare HMO

## 2020-11-07 DIAGNOSIS — Z794 Long term (current) use of insulin: Secondary | ICD-10-CM | POA: Diagnosis not present

## 2020-11-07 DIAGNOSIS — E1122 Type 2 diabetes mellitus with diabetic chronic kidney disease: Secondary | ICD-10-CM | POA: Diagnosis not present

## 2020-11-07 DIAGNOSIS — N183 Chronic kidney disease, stage 3 unspecified: Secondary | ICD-10-CM | POA: Diagnosis not present

## 2020-11-07 DIAGNOSIS — E7800 Pure hypercholesterolemia, unspecified: Secondary | ICD-10-CM

## 2020-11-07 LAB — LIPID PANEL
Cholesterol: 112 mg/dL (ref 0–200)
HDL: 54.7 mg/dL (ref >39.00)
LDL Cholesterol: 41 mg/dL (ref 0–99)
NonHDL: 56.99
Total CHOL/HDL Ratio: 2
Triglycerides: 79 mg/dL (ref 0.0–149.0)
VLDL: 15.8 mg/dL (ref 0.0–40.0)

## 2020-11-07 LAB — HEPATIC FUNCTION PANEL
ALT: 19 U/L (ref 0–35)
AST: 19 U/L (ref 0–37)
Albumin: 4.4 g/dL (ref 3.5–5.2)
Alkaline Phosphatase: 79 U/L (ref 39–117)
Bilirubin, Direct: 0.2 mg/dL (ref 0.0–0.3)
Total Bilirubin: 0.6 mg/dL (ref 0.2–1.2)
Total Protein: 6.9 g/dL (ref 6.0–8.3)

## 2020-11-07 LAB — MICROALBUMIN / CREATININE URINE RATIO
Creatinine,U: 42.7 mg/dL
Microalb Creat Ratio: 7.2 mg/g (ref 0.0–30.0)
Microalb, Ur: 3.1 mg/dL — ABNORMAL HIGH (ref 0.0–1.9)

## 2020-11-07 LAB — BASIC METABOLIC PANEL
BUN: 24 mg/dL — ABNORMAL HIGH (ref 6–23)
CO2: 28 mEq/L (ref 19–32)
Calcium: 9.4 mg/dL (ref 8.4–10.5)
Chloride: 104 mEq/L (ref 96–112)
Creatinine, Ser: 1.03 mg/dL (ref 0.40–1.20)
GFR: 52.96 mL/min — ABNORMAL LOW (ref >60.00)
Glucose, Bld: 101 mg/dL — ABNORMAL HIGH (ref 70–99)
Potassium: 4.3 mEq/L (ref 3.5–5.1)
Sodium: 143 mEq/L (ref 135–145)

## 2020-11-07 LAB — HEMOGLOBIN A1C: Hgb A1c MFr Bld: 6.9 % — ABNORMAL HIGH (ref 4.6–6.5)

## 2020-11-20 DIAGNOSIS — K580 Irritable bowel syndrome with diarrhea: Secondary | ICD-10-CM | POA: Diagnosis not present

## 2020-11-20 DIAGNOSIS — K8681 Exocrine pancreatic insufficiency: Secondary | ICD-10-CM | POA: Diagnosis not present

## 2020-11-24 ENCOUNTER — Ambulatory Visit: Payer: Medicare HMO | Admitting: Internal Medicine

## 2020-12-19 ENCOUNTER — Telehealth: Payer: Medicare HMO

## 2020-12-26 ENCOUNTER — Ambulatory Visit (INDEPENDENT_AMBULATORY_CARE_PROVIDER_SITE_OTHER): Payer: Medicare HMO | Admitting: Pharmacist

## 2020-12-26 DIAGNOSIS — I10 Essential (primary) hypertension: Secondary | ICD-10-CM

## 2020-12-26 DIAGNOSIS — I480 Paroxysmal atrial fibrillation: Secondary | ICD-10-CM

## 2020-12-26 DIAGNOSIS — E7800 Pure hypercholesterolemia, unspecified: Secondary | ICD-10-CM

## 2020-12-26 DIAGNOSIS — N183 Chronic kidney disease, stage 3 unspecified: Secondary | ICD-10-CM

## 2020-12-26 DIAGNOSIS — I700 Atherosclerosis of aorta: Secondary | ICD-10-CM

## 2020-12-26 DIAGNOSIS — E1140 Type 2 diabetes mellitus with diabetic neuropathy, unspecified: Secondary | ICD-10-CM

## 2020-12-26 MED ORDER — METFORMIN HCL ER 500 MG PO TB24: 1000.0000 mg | ORAL_TABLET | Freq: Two times a day (BID) | ORAL | 3 refills | Status: DC

## 2020-12-26 MED ORDER — HUMALOG 100 UNIT/ML ~~LOC~~ SOLN: SUBCUTANEOUS | Status: DC

## 2020-12-26 MED ORDER — BASAGLAR KWIKPEN 100 UNIT/ML ~~LOC~~ SOPN: 24.0000 [IU] | PEN_INJECTOR | Freq: Every morning | SUBCUTANEOUS | Status: DC

## 2020-12-26 NOTE — Chronic Care Management (AMB) (Signed)
Chronic Care Management Pharmacy Note  12/26/2020 Name:  Beth Tyler MRN:  [id number] DOB:  1945/09/05  Subjective: Beth Tyler is an 75 y.o. year old female who is a primary patient of Einar Pheasant, MD.  The CCM team was consulted for assistance with disease management and care coordination needs.    Engaged with patient by telephone for follow up visit in response to provider referral for pharmacy case management and/or care coordination services.   Consent to Services:  The patient was given information about Chronic Care Management services, agreed to services, and gave verbal consent prior to initiation of services.  Please see initial visit note for detailed documentation.   Patient Care Team: Einar Pheasant, MD as PCP - General (Internal Medicine) De Hollingshead, RPH-CPP as Pharmacist (Pharmacist)   Objective:  Lab Results  Component Value Date   CREATININE 1.03 11/07/2020   CREATININE 0.83 07/03/2020   CREATININE 0.96 02/07/2020    Lab Results  Component Value Date   HGBA1C 6.9 (H) 11/07/2020   Last diabetic Eye exam:  Lab Results  Component Value Date/Time   HMDIABEYEEXA No Retinopathy 02/04/2020 12:00 AM    Last diabetic Foot exam:  Lab Results  Component Value Date/Time   HMDIABFOOTEX on my exam 07/07/2020 12:00 AM        Component Value Date/Time   CHOL 112 11/07/2020 1010   TRIG 79.0 11/07/2020 1010   HDL 54.70 11/07/2020 1010   CHOLHDL 2 11/07/2020 1010   VLDL 15.8 11/07/2020 1010   LDLCALC 41 11/07/2020 1010   LDLDIRECT 74.4 02/21/2012 1006    Hepatic Function Latest Ref Rng & Units 11/07/2020 07/03/2020 02/07/2020  Total Protein 6.0 - 8.3 g/dL 6.9 6.8 7.2  Albumin 3.5 - 5.2 g/dL 4.4 4.6 4.5  AST 0 - 37 U/L 19 14 15   ALT 0 - 35 U/L 19 17 15   Alk Phosphatase 39 - 117 U/L 79 84 90  Total Bilirubin 0.2 - 1.2 mg/dL 0.6 0.5 0.7  Bilirubin, Direct 0.0 - 0.3 mg/dL 0.2 0.2 0.2    Lab Results  Component Value Date/Time   TSH  1.53 07/03/2020 10:26 AM   TSH 1.514 04/27/2019 04:03 PM   TSH 0.914 04/05/2019 06:36 AM   TSH 2.70 09/10/2018 10:23 AM   FREET4 1.53 (H) 04/04/2019 04:03 PM    CBC Latest Ref Rng & Units 07/03/2020 02/07/2020 12/06/2019  WBC 4.0 - 10.5 K/uL 8.9 10.0 11.2(H)  Hemoglobin 12.0 - 15.0 g/dL 13.2 13.7 13.6  Hematocrit 36.0 - 46.0 % 39.5 41.7 40.9  Platelets 150.0 - 400.0 K/uL 275.0 264.0 312     CHA2DS2-VASc Score = 5  This indicates a 7.2% annual risk of stroke. The patient's score is based upon: CHF History: 0 HTN History: 1 Diabetes History: 1 Stroke History: 0 Vascular Disease History: 0 Age Score: 2 Gender Score: 1   Social History   Tobacco Use  Smoking Status Never  Smokeless Tobacco Never   BP Readings from Last 3 Encounters:  08/27/20 128/68  07/07/20 138/74  02/22/20 127/68   Pulse Readings from Last 3 Encounters:  08/18/20 67  07/07/20 73  02/22/20 68   Wt Readings from Last 3 Encounters:  08/18/20 193 lb (87.5 kg)  07/07/20 188 lb (85.3 kg)  02/22/20 183 lb (83 kg)    Assessment: Review of patient past medical history, allergies, medications, health status, including review of consultants reports, laboratory and other test data, was performed as part of comprehensive  evaluation and provision of chronic care management services.   SDOH:  (Social Determinants of Health) assessments and interventions performed:    CCM Care Plan  Allergies  Allergen Reactions   Hydrocodone Anaphylaxis   Hydrocodone-Acetaminophen Anaphylaxis   Oxycodone-Acetaminophen Anaphylaxis and Other (See Comments)   Percocet [Oxycodone-Acetaminophen] Anaphylaxis   Demeclocycline Nausea Only   Other Nausea Only and Other (See Comments)   Penicillins Nausea Only    Did it involve swelling of the face/tongue/throat, SOB, or low BP? No Did it involve sudden or severe rash/hives, skin peeling, or any reaction on the inside of your mouth or nose? No Did you need to seek medical  attention at a hospital or doctor's office? No When did it last happen?       If all above answers are "NO", may proceed with cephalosporin use.   Tetracyclines & Related Nausea Only    Medications Reviewed Today     Reviewed by De Hollingshead, RPH-CPP (Pharmacist) on 12/26/20 at 0950  Med List Status: <None>   Medication Order Taking? Sig Documenting Provider Last Dose Status Informant  acetaminophen (TYLENOL) 500 MG tablet [id number] Yes Take 500-1,000 mg by mouth every 6 (six) hours as needed for mild pain or moderate pain.  [provider] Taking Active Self           Med Note ([place] Sep 19, 2020  2:36 PM) Taking 1-2 times daily  amLODipine (NORVASC) 10 MG tablet [id number] Yes Take 1 tablet by mouth once daily Einar Pheasant, MD Taking Active   apixaban (ELIQUIS) 5 MG TABS tablet [id number] Yes Take 1 tablet (5 mg total) by mouth 2 (two) times daily. Einar Pheasant, MD Taking Active   BD PEN NEEDLE NANO 2ND GEN 32G X 4 MM MISC [id number] Yes USE TO INJECT INSULIN 3 TIMES DAILY Einar Pheasant, MD Taking Active   Continuous Blood Gluc Sensor (FREESTYLE LIBRE 2 SENSOR) [place] [id number] Yes Use to check sugar at least 4 times daily Einar Pheasant, MD Taking Active   empagliflozin ([name]) 25 MG TABS tablet [id number] Yes Take 1 tablet (25 mg total) by mouth daily before breakfast. Einar Pheasant, MD Taking Active   ferrous sulfate 325 (65 FE) MG tablet [id number] Yes Take 325 mg by mouth daily with breakfast. [provider] Taking Active   fluticasone (CUTIVATE) 0.05 % cream [id number] Yes Apply 1 application topically as needed. Use as directed. Einar Pheasant, MD Taking Active   glucose blood [facility] VERIO) test strip [id number] Yes Use as instructed Einar Pheasant, MD Taking Active   Insulin Glargine [facility] KWIKPEN) 100 UNIT/ML [id number] Yes Inject 28 Units into the skin every morning. Einar Pheasant, MD Taking Active            Med Note  ([place] Dec 26, 2020  9:47 AM) 25 units  insulin lispro ([name]) 100 UNIT/ML injection [id number] Yes Inject 0.04-0.08 mLs (4-8 Units total) into the skin 3 (three) times daily as needed for high blood sugar. Einar Pheasant, MD Taking Active            Med Note ([place] Dec 26, 2020  9:47 AM) 6-10 units TID with meals  magnesium oxide (MAG-OX) 400 (240 Mg) MG tablet [id number] Yes Take 1 tablet by mouth once daily Einar Pheasant, MD Taking Active   metFORMIN (GLUCOPHAGE-XR) 500 MG 24 hr tablet [id number] Yes Take 2 tablets by mouth twice daily Einar Pheasant, MD Taking  Active   metoprolol tartrate (LOPRESSOR) 25 MG tablet [id number] Yes Take 25 mg by mouth 2 (two) times daily. [provider] Taking Active   Multiple Vitamin (MULTIVITAMIN) tablet [id number] Yes Take 1 tablet by mouth daily. [provider] Taking Active   Pancrelipase, Lip-Prot-Amyl, ([name] PO) [id number] Yes Take by mouth. Take 36,000-72,000 Units by mouth See admin instructions. Take 3 capsules by mouth three times daily with meals and take 2 capsule by mouth daily with snacks [provider] Taking Active   pantoprazole (PROTONIX) 40 MG tablet [id number] Yes TAKE 1 TABLET BY MOUTH TWICE DAILY BEFORE MEAL(S) Einar Pheasant, MD Taking Active   simvastatin (ZOCOR) 10 MG tablet [id number] Yes TAKE 1 TABLET BY MOUTH AT BEDTIME Einar Pheasant, MD Taking Active   triamcinolone (KENALOG) 0.1 % [id number] Yes APPLY  CREAM EXTERNALLY TWICE DAILY Einar Pheasant, MD Taking Active   vitamin B-12 (CYANOCOBALAMIN) 100 MCG tablet [id number] Yes Take 100 mcg by mouth daily. 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/06/2019   Paroxysmal A-fib ([place]) 04/06/2019   Aortic atherosclerosis ([place]) 10/22/2016   Back pain 05/23/2015   Cough 02/13/2015   Colon cancer screening 01/19/2014    Immunization History  Administered Date(s) Administered   Fluad Quad(high Dose 65+) 12/21/2018, 01/28/2020   Influenza Split 02/04/2012   Influenza, High Dose Seasonal PF 12/19/2015, 12/17/2016, 01/08/2018   Influenza,inj,Quad PF,6+ Mos 01/22/2013, 01/19/2014, 12/29/2014   Moderna Sars-Covid-2 Vaccination 07/07/2019   Pneumococcal Conjugate-13 02/06/2010, 06/11/2013   Pneumococcal Polysaccharide-23 02/11/2018    Conditions to be addressed/monitored: Atrial Fibrillation and DMII  Care Plan : Medication Management  Updates made by De Hollingshead, RPH-CPP since 12/26/2020 12:00 AM     Problem: Diabetes, CKD, IBS      Long-Range Goal: Disease Progression Prevention   This Visit's Progress: On track  Recent Progress: On track  Priority: High  Note:   Current Barriers:  Unable to independently afford treatment regimen Unable to achieve control of diabetes   Pharmacist Clinical Goal(s):  Over the next 90 days, patient will verbalize ability to afford treatment regimen. Over the next 90 days, patient will achieve control of diabetes as evidenced by improvement in A1c through collaboration with PharmD and provider.   Interventions: 1:1 collaboration with Einar Pheasant, MD regarding development and update of comprehensive plan of care as evidenced by provider attestation and co-signature Inter-disciplinary care team collaboration (see longitudinal plan of care) Comprehensive medication review performed; medication list updated in electronic medical record  Health Maintenance Yearly diabetic eye exam: up to date Yearly diabetic foot exam: up to date Urine microalbumin: up to date Yearly influenza vaccination:  due - recommended to pursue  Td/Tdap vaccination: due - previously postponed Pneumonia vaccination: up to date COVID vaccinations: due - recommended to complete primary series with monovalent vaccine, and then continue with booster recommendations Shingrix vaccinations: due - recommended to pursue  Colonoscopy: up to date  Diabetes: Controlled; current treatment: metformin XR 1000 mg BID, [name] 25 mg daily, Basaglar 25 units daily, [name] 6-10 units up to three times daily with meals Avoiding GLP1 d/t concurrent IBS-D, pancreatic enzyme insufficiency  and baseline GI concerns Most recent weight: 189 lbs Approved for assistance for Basaglar, [name], and [name] through 2022 Current glucose readings: using Libre 2 CGM, paying cash price [employer] coverage was more expensive) Date of Download: past 14 days 14 day Average Glucose: 159 mg/dL - Midnight to 6 am: 132 - 6 am to 12 pm: 115 - 12 pm to 6 pm: 183 - 6 pm to midnight: 203 Time in Goal:  - Time in range 70-180: 65%  - Time above range: 33% - Time below range: 2% - reports these occur over night, around midnight.  Current meal patterns: breakfast (9-10 am): eggs, sometimes bacon; lunch (2 pm): variable as she is running a lot of errands, taking grandchildren places; salad, sandwich, sometimes leftovers from the night before; supper (6-7:30 pm): vegetables, drink: splenda tea; occasional coffee if she isn't going anywhere.  Extensive dietary discussion. Reviewed to incorporate proteins and fibers (as tolerated based on IBS).  Given low blood sugars overnight, reduce Basaglar to 24 units daily. Plan to give 1-2 units more of [name] with supper than she has been doing, based upon glucose readings. Patient verbalizes understanding.  Continue metformin XR 1000 mg BID, [name] 25 mg daily.   Hypertension/Atrial Fibrillation: Appropriately managed; current rate control: metoprolol tartrate 25 mg BID; anticoagulant treatment: Eliquis 5  mg BID; additional antihypertensive regimen: amlodipine 10 mg daily ~ 4 pm; follows w/ cardiology Dr. Saralyn Pilar Approved for Eliquis assistance through 04/07/21 Home BP readings: 115/55 when she goes to bed (~ 11 pm); reports ~5-6 overnight episodes in the past few weeks with 5-6 w/ systolics in 354-656, HR in 90s, sweats and chills. Reports resolved with deep breathing, chair exercises. 1-2 times she has taken a tiny bit of metoprolol. Reports feeling of panic associated with it. Reports that she did discuss this with cardiology at her appointment a few weeks ago. Given low HR/BP at bedtime, they elected not to adjust metoprolol dose at that time.  Pre-medicine was 140/70 this morning.  Recommended that she contact cardiology for sooner follow up if her episodes of higher HR/BP start to occur more frequently or do not resolve with her current strategy of deep breathing. Reports cardiology recommended that she seek emergent medical attention if HR ever over 120. Advised to also seek emergent medical attention if development of chest pain, pressure, headaches. Recommended to consider sooner f/u with PCP if stress/panic becomes a more significant component of episodes. She verbalized understanding Recommended to continue current regimen at this time.   Hyperlipidemia: Controlled per last lipid panel; current treatment: simvastatin 10 mg daily  Continue current regimen at this time  IBS w/ pancreatic enzyme insufficiency: Appropriately managed; Current regimen: [name] 3 capsules with meals and 2 with snacks. Follows w/ [facility] Re-enrolled for [name] assistance for 2022. Reports it is difficult to manage. Feels out of control. Feels IBS is worse than it had been in previous years. Worse episodes in the morning, becomes better controlled at night. Avoids eating in the morning if she has to go out and run errands.  Recommended to continue current regimen at this time. Continue collaboration w/  GI.  GERD: Managed appropriate; current regimen: pantoprazole 40 mg twice daily; follows w/ [facility] Recommend to continue current regimen at this time along with collaboration with GI.  Supplement: Ferrous sulfate 325 mg daily, Vitamin B12, multivitamin   Patient Goals/Self-Care Activities Over the next 90 days, patient will:  - take medications as prescribed check blood glucose at  least three times daily using CGM, document, and provide at future appointments collaborate with provider on medication access solutions  Follow Up Plan: Telephone follow up appointment with care management team member scheduled for: ~ 12 weeks      Medication Assistance: [place], [name] obtained through [employer] medication assistance program.  Enrollment ends 04/07/21.  [name] obtained through [name] medication assistance program.  Enrollment ends 04/07/21. Eliquis obtained through [employer] medication assistance program. Enrollment ends 04/07/21.   Patient's preferred pharmacy is:  [facility] [address], [place] - [place] [address] Phone: [phone] Fax: [phone]  [facility] [place], [place] - [address] [address] Phone: [phone] Fax: [phone]    Follow Up:  Patient agrees to Care Plan and Follow-up.  Plan: Telephone follow up appointment with care management team member scheduled for:  ~12 weeks  Catie Darnelle Maffucci, PharmD, [place], [place] Clinical Pharmacist [employer] at [employer] [phone]

## 2020-12-26 NOTE — Patient Instructions (Signed)
Visit Information  PATIENT GOALS: Goals Addressed              This Visit's Progress     Patient Stated   .  Medication Monitoring (pt-stated)        Patient Goals/Self-Care Activities . Over the next 90 days, patient will:  - take medications as prescribed check blood glucose three times daily using CGM, document, and provide at future appointments collaborate with provider on medication access solutions        Patient verbalizes understanding of instructions provided today and agrees to view in MyChart.   Plan: Telephone follow up appointment with care management team member scheduled for:  ~ 12 weeks  Catie Charon Smedberg, PharmD, BCACP, CPP Clinical Pharmacist [facility] [place] [phone]  

## 2021-01-05 DIAGNOSIS — N183 Chronic kidney disease, stage 3 unspecified: Secondary | ICD-10-CM

## 2021-01-05 DIAGNOSIS — Z794 Long term (current) use of insulin: Secondary | ICD-10-CM | POA: Diagnosis not present

## 2021-01-05 DIAGNOSIS — E1140 Type 2 diabetes mellitus with diabetic neuropathy, unspecified: Secondary | ICD-10-CM

## 2021-01-05 DIAGNOSIS — I10 Essential (primary) hypertension: Secondary | ICD-10-CM

## 2021-01-05 DIAGNOSIS — I480 Paroxysmal atrial fibrillation: Secondary | ICD-10-CM

## 2021-01-05 DIAGNOSIS — E7800 Pure hypercholesterolemia, unspecified: Secondary | ICD-10-CM

## 2021-01-12 ENCOUNTER — Other Ambulatory Visit: Payer: Self-pay | Admitting: Internal Medicine

## 2021-01-16 ENCOUNTER — Other Ambulatory Visit: Payer: Self-pay

## 2021-01-16 ENCOUNTER — Ambulatory Visit (INDEPENDENT_AMBULATORY_CARE_PROVIDER_SITE_OTHER): Payer: Medicare HMO

## 2021-01-16 DIAGNOSIS — Z23 Encounter for immunization: Secondary | ICD-10-CM

## 2021-01-18 ENCOUNTER — Other Ambulatory Visit: Payer: Self-pay | Admitting: Internal Medicine

## 2021-01-22 ENCOUNTER — Telehealth: Payer: Self-pay | Admitting: Internal Medicine

## 2021-01-22 NOTE — Telephone Encounter (Signed)
Copied from CRM [id number]. Topic: Medicare AWV >> Jan 22, 2021  9:34 AM [place], [place] wrote: Reason for CRM: LVM 01/22/21 to r/s AWV appt [facility]. Please reschedule appt [facility]

## 2021-01-30 ENCOUNTER — Ambulatory Visit: Payer: Self-pay

## 2021-02-02 ENCOUNTER — Other Ambulatory Visit: Payer: Self-pay | Admitting: Internal Medicine

## 2021-02-02 DIAGNOSIS — E1140 Type 2 diabetes mellitus with diabetic neuropathy, unspecified: Secondary | ICD-10-CM

## 2021-02-04 ENCOUNTER — Other Ambulatory Visit: Payer: Self-pay | Admitting: Internal Medicine

## 2021-02-04 DIAGNOSIS — E1140 Type 2 diabetes mellitus with diabetic neuropathy, unspecified: Secondary | ICD-10-CM

## 2021-02-04 DIAGNOSIS — Z794 Long term (current) use of insulin: Secondary | ICD-10-CM

## 2021-02-09 ENCOUNTER — Other Ambulatory Visit: Payer: Self-pay | Admitting: Internal Medicine

## 2021-02-09 DIAGNOSIS — E119 Type 2 diabetes mellitus without complications: Secondary | ICD-10-CM | POA: Diagnosis not present

## 2021-02-09 LAB — HM DIABETES EYE EXAM

## 2021-02-16 ENCOUNTER — Encounter: Payer: Self-pay | Admitting: Internal Medicine

## 2021-02-16 ENCOUNTER — Ambulatory Visit (INDEPENDENT_AMBULATORY_CARE_PROVIDER_SITE_OTHER): Payer: Medicare HMO | Admitting: Internal Medicine

## 2021-02-16 ENCOUNTER — Other Ambulatory Visit: Payer: Self-pay

## 2021-02-16 VITALS — BP 132/74 | HR 62 | Temp 96.8°F | Ht 62.01 in | Wt 197.6 lb

## 2021-02-16 DIAGNOSIS — I480 Paroxysmal atrial fibrillation: Secondary | ICD-10-CM

## 2021-02-16 DIAGNOSIS — D6859 Other primary thrombophilia: Secondary | ICD-10-CM | POA: Diagnosis not present

## 2021-02-16 DIAGNOSIS — E1140 Type 2 diabetes mellitus with diabetic neuropathy, unspecified: Secondary | ICD-10-CM | POA: Diagnosis not present

## 2021-02-16 DIAGNOSIS — R198 Other specified symptoms and signs involving the digestive system and abdomen: Secondary | ICD-10-CM | POA: Diagnosis not present

## 2021-02-16 DIAGNOSIS — Z794 Long term (current) use of insulin: Secondary | ICD-10-CM

## 2021-02-16 DIAGNOSIS — E7800 Pure hypercholesterolemia, unspecified: Secondary | ICD-10-CM

## 2021-02-16 DIAGNOSIS — I700 Atherosclerosis of aorta: Secondary | ICD-10-CM | POA: Diagnosis not present

## 2021-02-16 DIAGNOSIS — E1122 Type 2 diabetes mellitus with diabetic chronic kidney disease: Secondary | ICD-10-CM | POA: Diagnosis not present

## 2021-02-16 DIAGNOSIS — N183 Chronic kidney disease, stage 3 unspecified: Secondary | ICD-10-CM

## 2021-02-16 DIAGNOSIS — I10 Essential (primary) hypertension: Secondary | ICD-10-CM | POA: Diagnosis not present

## 2021-02-16 LAB — HEPATIC FUNCTION PANEL
ALT: 16 U/L (ref 0–35)
AST: 16 U/L (ref 0–37)
Albumin: 4.4 g/dL (ref 3.5–5.2)
Alkaline Phosphatase: 76 U/L (ref 39–117)
Bilirubin, Direct: 0.2 mg/dL (ref 0.0–0.3)
Total Bilirubin: 0.7 mg/dL (ref 0.2–1.2)
Total Protein: 6.6 g/dL (ref 6.0–8.3)

## 2021-02-16 LAB — BASIC METABOLIC PANEL
BUN: 22 mg/dL (ref 6–23)
CO2: 29 mEq/L (ref 19–32)
Calcium: 9.3 mg/dL (ref 8.4–10.5)
Chloride: 105 mEq/L (ref 96–112)
Creatinine, Ser: 0.93 mg/dL (ref 0.40–1.20)
GFR: 59.75 mL/min — ABNORMAL LOW (ref >60.00)
Glucose, Bld: 88 mg/dL (ref 70–99)
Potassium: 4.3 mEq/L (ref 3.5–5.1)
Sodium: 142 mEq/L (ref 135–145)

## 2021-02-16 LAB — LIPID PANEL
Cholesterol: 113 mg/dL (ref 0–200)
HDL: 52.2 mg/dL (ref >39.00)
LDL Cholesterol: 42 mg/dL (ref 0–99)
NonHDL: 60.61
Total CHOL/HDL Ratio: 2
Triglycerides: 93 mg/dL (ref 0.0–149.0)
VLDL: 18.6 mg/dL (ref 0.0–40.0)

## 2021-02-16 LAB — HEMOGLOBIN A1C: Hgb A1c MFr Bld: 7.7 % — ABNORMAL HIGH (ref 4.6–6.5)

## 2021-02-16 NOTE — Progress Notes (Signed)
Patient ID: SIANI UTKE, female   DOB: 20-Jul-1945, 75 y.o.   MRN: [id number]   Subjective:    Patient ID: [name], female    DOB: 06/10/1945, 75 y.o.   MRN: [id number]  This visit occurred during the SARS-CoV-2 public health emergency.  Safety protocols were in place, including screening questions prior to the visit, additional usage of staff PPE, and extensive cleaning of exam room while observing appropriate contact time as indicated for disinfecting solutions.   Patient here for a scheduled follow up.   HPI Here to follow up regarding her blood sugar, blood pressure and cholesterol.  Reviewed outside readings - % time CGM active - 64%.  Target range 73%, high 16%%, very high 8%, low 3%.  Currently on basaglar and humalog.  Also taking metformin and jardiance.  Discussed low carb diet and exercise.  Breathing overall stable.  Bowel pain may be some better.  Bowels overall stable - no improvement.  Still with joint pain - hip, neck and arms.  Tylenol helps.     Past Medical History:  Diagnosis Date   Anemia    Arthritis    B12 deficiency    Carpal tunnel syndrome, bilateral    COVID-19 03/18/2019   Diabetes mellitus ([place]) 02/17/2012   Diabetes mellitus, type II (HCC)    Dyspnea    Gastritis    GERD (gastroesophageal reflux disease)    Hypercholesterolemia    Hypertension    IBS (irritable bowel syndrome)    Neuropathy    legs   Pernicious anemia    Vertigo    no episodes fo 9 or 10 yrs   Past Surgical History:  Procedure Laterality Date   ANTERIOR VITRECTOMY Right 12/01/2017   Procedure: ANTERIOR VITRECTOMY;  Surgeon: Eulogio Bear, MD;  Location: [facility];  Service: Ophthalmology;  Laterality: Right;   BREAST BIOPSY Right 1980   neg   BREAST SURGERY  1980   benign cyst removal   CATARACT EXTRACTION W/PHACO Left 10/13/2017   Procedure: CATARACT EXTRACTION PHACO AND INTRAOCULAR LENS PLACEMENT ([place])  LEFT  DIABETES;  Surgeon: Eulogio Bear, MD;   Location: [facility];  Service: Ophthalmology;  Laterality: Left;  Diabetic - insulin and oral meds   CATARACT EXTRACTION W/PHACO Right 12/01/2017   Procedure: CATARACT EXTRACTION PHACO AND INTRAOCULAR LENS PLACEMENT ([place])  RIGHT DIABETIC;  Surgeon: Eulogio Bear, MD;  Location: [facility];  Service: Ophthalmology;  Laterality: Right;  Diabetic - insulin and oral   CHOLECYSTECTOMY  1983   Family History  Problem Relation Age of Onset   Diabetes Father    Kidney disease Father    Heart disease Father    Alcohol abuse Mother    Dementia Mother    Diabetes Mother    Diabetes Other    Hemophilia Brother    Uterine cancer Sister    Diabetes Sister    COPD Sister    Breast cancer Maternal Aunt 25   Breast cancer Daughter 46   Stroke Daughter    Diabetes Son    Social History   Socioeconomic History   Marital status: Widowed    Spouse name: Not on file   Number of children: 4   Years of education: Not on file   Highest education level: Not on file  Occupational History   Not on file  Tobacco Use   Smoking status: Never   Smokeless tobacco: Never  Vaping Use   Vaping Use: Never used  Substance and Sexual Activity   Alcohol use: No    Alcohol/week: 0.0 standard drinks   Drug use: No   Sexual activity: Not Currently  Other Topics Concern   Not on file  Social History Narrative   Widowed. Has four children. Manager of an apartment building. No tobacco, alcohol or other drug use   Social Determinants of Health   Financial Resource Strain: High Risk   Difficulty of Paying Living Expenses: Hard  Food Insecurity: Not on file  Transportation Needs: Not on file  Physical Activity: Not on file  Stress: Not on file  Social Connections: Not on file     Review of Systems  Constitutional:  Negative for appetite change and unexpected weight change.  HENT:  Negative for congestion and sinus pressure.   Respiratory:  Negative for cough and chest tightness.         Breathing overall stable.   Cardiovascular:  Negative for chest pain and palpitations.       No increased swelling.   Gastrointestinal:  Negative for abdominal pain, diarrhea, nausea and vomiting.  Genitourinary:  Negative for difficulty urinating and dysuria.  Musculoskeletal:  Negative for myalgias.       Neck, hip and arm pain as outlined.   Skin:  Negative for color change and rash.  Neurological:  Negative for dizziness, light-headedness and headaches.  Psychiatric/Behavioral:  Negative for agitation and dysphoric mood.       Objective:     BP 132/74   Pulse 62   Temp (!) 96.8 F (36 C)   Ht 5' 2.01" (1.575 m)   Wt 197 lb 9.6 oz (89.6 kg)   SpO2 97%   BMI 36.13 kg/m  Wt Readings from Last 3 Encounters:  02/16/21 197 lb 9.6 oz (89.6 kg)  08/18/20 193 lb (87.5 kg)  07/07/20 188 lb (85.3 kg)    Physical Exam Vitals reviewed.  Constitutional:      General: She is not in acute distress.    Appearance: Normal appearance.  HENT:     Head: Normocephalic and atraumatic.     Right Ear: External ear normal.     Left Ear: External ear normal.  Eyes:     General: No scleral icterus.       Right eye: No discharge.        Left eye: No discharge.     Conjunctiva/sclera: Conjunctivae normal.  Neck:     Thyroid: No thyromegaly.  Cardiovascular:     Rate and Rhythm: Normal rate and regular rhythm.  Pulmonary:     Effort: No respiratory distress.     Breath sounds: Normal breath sounds. No wheezing.  Abdominal:     General: Bowel sounds are normal.     Palpations: Abdomen is soft.     Tenderness: There is no abdominal tenderness.  Musculoskeletal:        General: No swelling or tenderness.     Cervical back: Neck supple. No tenderness.  Lymphadenopathy:     Cervical: No cervical adenopathy.  Skin:    Findings: No erythema or rash.  Neurological:     Mental Status: She is alert.  Psychiatric:        Mood and Affect: Mood normal.        Behavior: Behavior  normal.     Outpatient Encounter Medications as of 02/16/2021  Medication Sig   acetaminophen (TYLENOL) 500 MG tablet Take 500-1,000 mg by mouth every 6 (six) hours as needed for mild pain  or moderate pain.    amLODipine (NORVASC) 10 MG tablet Take 1 tablet by mouth once daily   apixaban (ELIQUIS) 5 MG TABS tablet Take 1 tablet (5 mg total) by mouth 2 (two) times daily.   BD PEN NEEDLE NANO 2ND GEN 32G X 4 MM MISC USE TO INJECT INSULIN 3 TIMES DAILY   Continuous Blood Gluc Sensor (FREESTYLE LIBRE 2 SENSOR) MISC USE TO CHECK BLOOD SUGAR AT LEAST 4 TIMES DAILY   empagliflozin ([name]) 25 MG TABS tablet Take 1 tablet (25 mg total) by mouth daily before breakfast.   ferrous sulfate 325 (65 FE) MG tablet Take 325 mg by mouth daily with breakfast.   fluticasone (CUTIVATE) 0.05 % cream Apply 1 application topically as needed. Use as directed.   glucose blood (ONETOUCH VERIO) test strip USE AS DIRECTED   Insulin Glargine (BASAGLAR KWIKPEN) 100 UNIT/ML Inject 24 Units into the skin every morning.   insulin lispro ([name]) 100 UNIT/ML injection Inject 6-12 units up to three times daily with meals   magnesium oxide (MAG-OX) 400 (240 Mg) MG tablet Take 1 tablet by mouth once daily   metFORMIN (GLUCOPHAGE-XR) 500 MG 24 hr tablet Take 2 tablets (1,000 mg total) by mouth 2 (two) times daily.   metoprolol tartrate (LOPRESSOR) 25 MG tablet Take 25 mg by mouth 2 (two) times daily.   Multiple Vitamin (MULTIVITAMIN) tablet Take 1 tablet by mouth daily.   Pancrelipase, Lip-Prot-Amyl, ([name] PO) Take by mouth. Take 36,000-72,000 Units by mouth See admin instructions. Take 3 capsules by mouth three times daily with meals and take 2 capsule by mouth daily with snacks   pantoprazole (PROTONIX) 40 MG tablet TAKE 1 TABLET BY MOUTH TWICE DAILY BEFORE MEAL(S)   simvastatin (ZOCOR) 10 MG tablet TAKE 1 TABLET BY MOUTH AT BEDTIME   triamcinolone (KENALOG) 0.1 % APPLY  CREAM EXTERNALLY TWICE DAILY   vitamin B-12  (CYANOCOBALAMIN) 100 MCG tablet Take 100 mcg by mouth daily.   No facility-administered encounter medications on file as of 02/16/2021.     Lab Results  Component Value Date   WBC 8.9 07/03/2020   HGB 13.2 07/03/2020   HCT 39.5 07/03/2020   PLT 275.0 07/03/2020   GLUCOSE 88 02/16/2021   CHOL 113 02/16/2021   TRIG 93.0 02/16/2021   HDL 52.20 02/16/2021   LDLDIRECT 74.4 02/21/2012   LDLCALC 42 02/16/2021   ALT 16 02/16/2021   AST 16 02/16/2021   NA 142 02/16/2021   K 4.3 02/16/2021   CL 105 02/16/2021   CREATININE 0.93 02/16/2021   BUN 22 02/16/2021   CO2 29 02/16/2021   TSH 1.53 07/03/2020   INR 1.0 03/18/2019   HGBA1C 7.7 (H) 02/16/2021   MICROALBUR 3.1 (H) 11/07/2020    MM 3D SCREEN BREAST BILATERAL  Result Date: 08/22/2020 CLINICAL DATA:  Screening. EXAM: DIGITAL SCREENING BILATERAL MAMMOGRAM WITH TOMOSYNTHESIS AND CAD TECHNIQUE: Bilateral screening digital craniocaudal and mediolateral oblique mammograms were obtained. Bilateral screening digital breast tomosynthesis was performed. The images were evaluated with computer-aided detection. COMPARISON:  Previous exam(s). ACR Breast Density Category b: There are scattered areas of fibroglandular density. FINDINGS: There are no findings suspicious for malignancy. The images were evaluated with computer-aided detection. IMPRESSION: No mammographic evidence of malignancy. A result letter of this screening mammogram will be mailed directly to the patient. RECOMMENDATION: Screening mammogram in one year. (Code:SM-B-01Y) BI-RADS CATEGORY  1: Negative. Electronically Signed   By: Ammie Ferrier M.D.   On: 08/22/2020 14:24  Assessment & Plan:   Problem List Items Addressed This Visit     Aortic atherosclerosis ([place])    Continue simvastatin.       Benign essential hypertension    Continue metoprolol and amlodipine.  Blood pressure doing well.  Follow pressures.  No changes made.  Follow metabolic panel.       Relevant  Orders   Basic metabolic panel (Completed)   Change in bowel movement    On creon.  Pain some better. Follow.        Hypercholesterolemia    Continue simvastatin.  Low cholesterol diet and exercise.  Follow lipid panel and liver function tests.       Relevant Orders   Lipid panel (Completed)   Hepatic function panel (Completed)   Paroxysmal A-fib (HCC)     On metoprolol.  Overall stable.  Follow.       Thrombophilia ([place])    afib on eliquis.  Continue eliquis.  Heart rate doing well.  Follow.       Type 2 diabetes mellitus with diabetic chronic kidney disease ([place])    Sugars as outlined.  Discussed low carb diet and exercise.  Check met b and a1c.  May need to adjust insuline - for better sugar control.  Need to avoid lows.  Follow.       Other Visit Diagnoses     Type 2 diabetes mellitus with diabetic neuropathy, with long-term current use of insulin ([place])    -  Primary   Relevant Orders   Hemoglobin A1c (Completed)        Einar Pheasant, MD

## 2021-02-25 ENCOUNTER — Encounter: Payer: Self-pay | Admitting: Internal Medicine

## 2021-02-25 NOTE — Assessment & Plan Note (Signed)
afib on eliquis.  Continue eliquis.  Heart rate doing well.  Follow.

## 2021-02-25 NOTE — Assessment & Plan Note (Signed)
Continue simvastatin.  Low cholesterol diet and exercise.  Follow lipid panel and liver function tests.   

## 2021-02-25 NOTE — Assessment & Plan Note (Signed)
Continue metoprolol and amlodipine.  Blood pressure doing well.  Follow pressures.  No changes made.  Follow metabolic panel.

## 2021-02-25 NOTE — Assessment & Plan Note (Signed)
Sugars as outlined.  Discussed low carb diet and exercise.  Check met b and a1c.  May need to adjust insuline - for better sugar control.  Need to avoid lows.  Follow.

## 2021-02-25 NOTE — Assessment & Plan Note (Signed)
On creon.  Pain some better. Follow.

## 2021-02-25 NOTE — Assessment & Plan Note (Signed)
On metoprolol.  Overall stable.  Follow.

## 2021-02-25 NOTE — Assessment & Plan Note (Signed)
Continue simvastatin. 

## 2021-03-06 ENCOUNTER — Telehealth: Payer: Self-pay | Admitting: Internal Medicine

## 2021-03-06 DIAGNOSIS — E1140 Type 2 diabetes mellitus with diabetic neuropathy, unspecified: Secondary | ICD-10-CM

## 2021-03-06 DIAGNOSIS — Z794 Long term (current) use of insulin: Secondary | ICD-10-CM

## 2021-03-06 MED ORDER — INSULIN LISPRO (1 UNIT DIAL) 100 UNIT/ML (KWIKPEN): 12.0000 [IU] | PEN_INJECTOR | Freq: Three times a day (TID) | SUBCUTANEOUS | 2 refills | Status: DC

## 2021-03-06 NOTE — Telephone Encounter (Signed)
Pt is requesting to speak to Catie regarding pts medication. Pt is requesting a refill for insulin lispro ([name]) 100 UNIT/ML injection Pt uses the [employer]. Pt also is giving [place] a fax number for the foundation  [phone]

## 2021-03-06 NOTE — Telephone Encounter (Signed)
Order sent to [facility] pharmacy [employer] cares)

## 2021-03-07 ENCOUNTER — Other Ambulatory Visit: Payer: Self-pay | Admitting: Internal Medicine

## 2021-03-07 DIAGNOSIS — E1140 Type 2 diabetes mellitus with diabetic neuropathy, unspecified: Secondary | ICD-10-CM

## 2021-03-14 ENCOUNTER — Telehealth: Payer: Medicare HMO

## 2021-03-14 ENCOUNTER — Ambulatory Visit: Payer: Medicare HMO | Admitting: Pharmacist

## 2021-03-14 DIAGNOSIS — I480 Paroxysmal atrial fibrillation: Secondary | ICD-10-CM

## 2021-03-14 DIAGNOSIS — I10 Essential (primary) hypertension: Secondary | ICD-10-CM

## 2021-03-14 DIAGNOSIS — I700 Atherosclerosis of aorta: Secondary | ICD-10-CM

## 2021-03-14 DIAGNOSIS — E7800 Pure hypercholesterolemia, unspecified: Secondary | ICD-10-CM

## 2021-03-14 DIAGNOSIS — E1140 Type 2 diabetes mellitus with diabetic neuropathy, unspecified: Secondary | ICD-10-CM

## 2021-03-14 DIAGNOSIS — Z794 Long term (current) use of insulin: Secondary | ICD-10-CM

## 2021-03-14 MED ORDER — BASAGLAR KWIKPEN 100 UNIT/ML ~~LOC~~ SOPN: 22.0000 [IU] | PEN_INJECTOR | Freq: Every morning | SUBCUTANEOUS | Status: DC

## 2021-03-14 MED ORDER — FREESTYLE LIBRE 2 SENSOR MISC: 11 refills | Status: DC

## 2021-03-14 MED ORDER — INSULIN LISPRO (1 UNIT DIAL) 100 UNIT/ML (KWIKPEN): 8.0000 [IU] | PEN_INJECTOR | Freq: Three times a day (TID) | SUBCUTANEOUS | 2 refills | Status: DC

## 2021-03-14 NOTE — Chronic Care Management (AMB) (Signed)
Chronic Care Management CCM Pharmacy Note  03/14/2021 Name:  Beth Tyler MRN:  [id number] DOB:  08/05/1945  Summary: - Due to reapply for patient assistance - Elevated post prandial readings with lunch and supper  Recommendations/Changes made from today's visit: - Increase [name] to 12-14 units with lunch and supper.  - Assisted with reapplication for patient assistance  Subjective: Beth Tyler is an 75 y.o. year old female who is a primary patient of Einar Pheasant, MD.  The CCM team was consulted for assistance with disease management and care coordination needs.    Engaged with patient by telephone for follow up visit for pharmacy case management and/or care coordination services.   Objective:  Medications Reviewed Today     Reviewed by De Hollingshead, RPH-CPP (Pharmacist) on 03/14/21 at [place] List Status: <None>   Medication Order Taking? Sig Documenting Provider Last Dose Status Informant  acetaminophen (TYLENOL) 500 MG tablet [id number] Yes Take 500-1,000 mg by mouth every 6 (six) hours as needed for mild pain or moderate pain.  [provider] Taking Active Self           Med Note De Hollingshead   Wed Mar 14, 2021 12:58 PM) Taking 1000 mg Q6H  amLODipine (NORVASC) 10 MG tablet [id number] Yes Take 1 tablet by mouth once daily Einar Pheasant, MD Taking Active   apixaban (ELIQUIS) 5 MG TABS tablet [id number] Yes Take 1 tablet (5 mg total) by mouth 2 (two) times daily. Einar Pheasant, MD Taking Active   BD PEN NEEDLE NANO 2ND GEN 32G X 4 MM MISC [id number] Yes USE TO INJECT INSULIN 3 TIMES DAILY Einar Pheasant, MD Taking Active   Continuous Blood Gluc Sensor (FREESTYLE LIBRE 2 SENSOR) MISC [id number] Yes USE TO CHECK BLOOD SUGARS AT LEAST 4 TIMES DAILY Einar Pheasant, MD Taking Active   empagliflozin ([name]) 25 MG TABS tablet [id number] Yes Take 1 tablet (25 mg total) by mouth daily before breakfast. Einar Pheasant, MD Taking Active    ferrous sulfate 325 (65 FE) MG tablet [id number] Yes Take 325 mg by mouth daily with breakfast. [provider] Taking Active   fluticasone (CUTIVATE) 0.05 % cream [id number] Yes Apply 1 application topically as needed. Use as directed. Einar Pheasant, MD Taking Active   glucose blood [facility] VERIO) test strip [id number] Yes USE AS DIRECTED Einar Pheasant, MD Taking Active   Insulin Glargine [facility] KWIKPEN) 100 UNIT/ML [id number] Yes Inject 24 Units into the skin every morning. Einar Pheasant, MD Taking Active   insulin lispro [facility]) 100 UNIT/ML KwikPen [id number] Yes Inject 12 Units into the skin 3 (three) times daily. Einar Pheasant, MD Taking Active            Med Note Darnelle Maffucci, Arville Lime   Wed Mar 14, 2021  1:01 PM) 8-10 units with meals   magnesium oxide (MAG-OX) 400 (240 Mg) MG tablet [id number] Yes Take 1 tablet by mouth once daily Einar Pheasant, MD Taking Active   metFORMIN (GLUCOPHAGE-XR) 500 MG 24 hr tablet [id number] Yes Take 2 tablets (1,000 mg total) by mouth 2 (two) times daily. Einar Pheasant, MD Taking Active   metoprolol tartrate (LOPRESSOR) 25 MG tablet [id number] Yes Take 25 mg by mouth 2 (two) times daily. [provider] Taking Active   Multiple Vitamin (MULTIVITAMIN) tablet [id number] Yes Take 1 tablet by mouth daily. [provider] Taking Active   Pancrelipase, Lip-Prot-Amyl, ([name] PO) [id number] Yes Take by mouth. Take 36,000-72,000 Units by mouth See  admin instructions. Take 3 capsules by mouth three times daily with meals and take 2 capsule by mouth daily with snacks [provider] Taking Active   pantoprazole (PROTONIX) 40 MG tablet [id number] Yes TAKE 1 TABLET BY MOUTH TWICE DAILY BEFORE MEAL(S) Dale [place], MD Taking Active   simvastatin (ZOCOR) 10 MG tablet [id number] Yes TAKE 1 TABLET BY MOUTH AT BEDTIME Dale Langdon, MD Taking Active   triamcinolone (KENALOG) 0.1 % [id number] Yes APPLY  CREAM EXTERNALLY TWICE  DAILY Dale [place], MD Taking Active   vitamin B-12 (CYANOCOBALAMIN) 100 MCG tablet [id number] Yes Take 100 mcg by mouth daily. [provider] Taking Active             Pertinent Labs:   Lab Results  Component Value Date   HGBA1C 7.7 (H) 02/16/2021   Lab Results  Component Value Date   CHOL 113 02/16/2021   HDL 52.20 02/16/2021   LDLCALC 42 02/16/2021   LDLDIRECT 74.4 02/21/2012   TRIG 93.0 02/16/2021   CHOLHDL 2 02/16/2021   Lab Results  Component Value Date   CREATININE 0.93 02/16/2021   BUN 22 02/16/2021   NA 142 02/16/2021   K 4.3 02/16/2021   CL 105 02/16/2021   CO2 29 02/16/2021    SDOH:  (Social Determinants of Health) assessments and interventions performed:    CCM Care Plan  Review of patient past medical history, allergies, medications, health status, including review of consultants reports, laboratory and other test data, was performed as part of comprehensive evaluation and provision of chronic care management services.   Care Plan : Medication Management  Updates made by [name], RPH-CPP since 03/14/2021 12:00 AM     Problem: Diabetes, CKD, IBS      Long-Range Goal: Disease Progression Prevention   Recent Progress: On track  Priority: High  Note:   Current Barriers:  Unable to independently afford treatment regimen Unable to achieve control of diabetes   Pharmacist Clinical Goal(s):  Over the next 90 days, patient will verbalize ability to afford treatment regimen. Over the next 90 days, patient will achieve control of diabetes as evidenced by improvement in A1c through collaboration with PharmD and provider.   Interventions: 1:1 collaboration with Dale [place]. [place], MD regarding development and update of comprehensive plan of care as evidenced by provider attestation and co-signature Inter-disciplinary care team collaboration (see longitudinal plan of care) Comprehensive medication review performed; medication list  updated in electronic medical record  Health Maintenance Yearly diabetic eye exam: up to date Yearly diabetic foot exam: up to date Urine microalbumin: up to date Yearly influenza vaccination: up to date   Td/Tdap vaccination: due - previously postponed Pneumonia vaccination: up to date COVID vaccinations: due - declined Shingrix vaccinations: due - recommended to pursue  Colonoscopy: up to date  Diabetes: Uncontrolled; current treatment: metformin XR 1000 mg BID, [name] 25 mg daily, Basaglar 22 units daily, [name] if >150, she does 10; 8-10 units up to three times daily with meals Avoiding GLP1 d/t concurrent IBS-D, pancreatic enzyme insufficiency and baseline GI concerns Most recent weight: 189 lbs Approved for assistance for Basaglar, [name], and [name] through 2022 Current glucose readings: using Libre 2 CGM, paying cash price [employer] coverage was more expensive) Date of Download: past 14 days 14 day Average Glucose: 174 mg/dL - Midnight to 6 am: 174 - 6 am to 12 pm: 152 - 12 pm to 6 pm: 200 - 6 pm to midnight: 219 Time in Goal:  - Time in  range 70-180: 58%  - Time above range: 41% - Time below range: 1% Given fastings being at goal, continue Basaglar 22 units daily. Continue [name] 8-10 units with breakfast, but increase lunch and supper [name] doses to 12-14 (12 units if premeal <150 and 14 if premeal >150). Continue metformin XR 1000 mg BID, [name] 25 mg daily.  Will collaborate w/ CPhT, patient, and providers to reapply for patient assistance for Basaglar, [name],  for 2023.   Hypertension/Atrial Fibrillation: Appropriately managed; current rate control: metoprolol tartrate 25 mg BID; anticoagulant treatment: Eliquis 5 mg BID; additional antihypertensive regimen: amlodipine 10 mg daily; follows w/ cardiology Dr. Saralyn Pilar Approved for Eliquis assistance through 04/07/21. Has to meet out  Previously recommended to continue current regimen at this time.    Hyperlipidemia: Controlled per last lipid panel; current treatment: simvastatin 10 mg daily  Previously recommended to continue current regimen at this time  IBS w/ pancreatic enzyme insufficiency: Appropriately managed; Current regimen: [name] 3 capsules with meals and 2 with snacks. Follows w/ [facility] Re-enrolled for [name] assistance for 2022. Will collaborate w/ CPhT, patient, and providers to reapply for patient assistance for [name] for 2023  Recommended to continue current regimen at this time. Continue collaboration w/ GI.  GERD: Managed appropriate; current regimen: pantoprazole 40 mg twice daily; follows w/ [facility] Recommend to continue current regimen at this time along with collaboration with GI.  Supplement: Ferrous sulfate 325 mg daily, Vitamin B12, multivitamin   Patient Goals/Self-Care Activities Over the next 90 days, patient will:  - take medications as prescribed check blood glucose at least three times daily using CGM, document, and provide at future appointments collaborate with provider on medication access solutions      Plan: Telephone follow up appointment with care management team member scheduled for:  8 weeks  Catie Darnelle Maffucci, PharmD, [place], [place] Pharmacist [employer] at [employer] [phone]

## 2021-03-14 NOTE — Patient Instructions (Signed)
Increase [name] to 12-14 units with lunch and supper (12 units if pre-meal reading <150 and 14 if pre-meal reading >150). Continue 8-10 units with breakfast.   Take care!  Visit Information  Following are the goals we discussed today:  Patient Goals/Self-Care Activities Over the next 90 days, patient will:  - take medications as prescribed check blood glucose at least three times daily using CGM, document, and provide at future appointments collaborate with provider on medication access solutions        Plan: Telephone follow up appointment with care management team member scheduled for:  8 weeks   Catie Feliz Beam, PharmD, [place], CPP Clinical Pharmacist [facility] at [facility] [phone]   Please call the care guide team at [phone] if you need to cancel or reschedule your appointment.   Patient verbalizes understanding of instructions provided today and agrees to view in MyChart.

## 2021-03-15 ENCOUNTER — Other Ambulatory Visit: Payer: Self-pay | Admitting: Internal Medicine

## 2021-03-15 DIAGNOSIS — Z794 Long term (current) use of insulin: Secondary | ICD-10-CM

## 2021-03-15 DIAGNOSIS — E1140 Type 2 diabetes mellitus with diabetic neuropathy, unspecified: Secondary | ICD-10-CM

## 2021-03-16 ENCOUNTER — Telehealth: Payer: Self-pay | Admitting: Pharmacy Technician

## 2021-03-16 DIAGNOSIS — Z596 Low income: Secondary | ICD-10-CM

## 2021-03-16 NOTE — Progress Notes (Signed)
[facility] Customer service manager [facility])                                            [facility] Quality Pharmacy Team    03/16/2021  Beth Tyler July 14, 1945 [id number]  FOR 2023 RE ENROLLMENT                                      Medication Assistance Referral  Referral From: [facility] Embedded RPh Catie T.   Medication/Company: [name] / [name] Patient application portion:  Mailed Provider application portion: Faxed  to Dr. Kathryne Hitch Provider address/fax verified via: Office website  Medication/Company: [employer] / BI Patient application portion:  Mailed Provider application portion: Interoffice Mailed to Dr. Lorin Picket Provider address/fax verified via: Office website  Medication/Company: Mariella Saa / Julious Oka Patient application portion:  Mailed Provider application portion: Interoffice Mailed to Dr. Lorin Picket Provider address/fax verified via: Office website  Medication/Company: [name] / [name] Patient application portion:  Mailed Provider application portion: Interoffice Mailed to Dr. Lorin Picket Provider address/fax verified via: Office website .  Zyrell Carmean P. Azaria Bartell, CPhT [facility] [employer]  [phone]

## 2021-03-23 ENCOUNTER — Telehealth: Payer: Self-pay | Admitting: Internal Medicine

## 2021-03-23 NOTE — Telephone Encounter (Signed)
Reports she needs a refill on [name] per [name]. Called [name] to confirm and determine best fax for refills, unfortunately, wait time was over an hour.  [facility].  They will fax a refill to [name] at [phone] today.   Notified patient.

## 2021-03-23 NOTE — Telephone Encounter (Signed)
Pt called in requesting for refill on medication ([name] PO Pancrelipase, Lip-Prot-Amyl, ([name] PO). Pt is requesting callback.

## 2021-04-02 ENCOUNTER — Other Ambulatory Visit: Payer: Self-pay | Admitting: Internal Medicine

## 2021-04-03 ENCOUNTER — Telehealth: Payer: Self-pay | Admitting: Pharmacist

## 2021-04-03 NOTE — Telephone Encounter (Signed)
Patient calls, reports that she called [name] to follow up on the status of the [name] refill. She notes they told her they did not receive a new prescription from the provider.   It appears that on 12/16, CMA at GI provider [place]'s office faxed a refill to [name].   I called [facility] to see if they had any [name] samples. [facility] does not allow samples in any practices. They will re-fax a prescription to [facility] patient assistance. I noted I would call back if I get any different contact information.   CPhT called [name] to confirm the correct contact information for their pharmacy. They received the refill faxed in 12/16 but did not process. They will do so today (order ID is [id number]), they will request to expedite shipment (would arrive in 5-7 business days) but if they cannot expedite, will arrive in 7-10 business days.   Patient notified. She notes she received PAP application information in the mail and will mail back later this week

## 2021-04-06 ENCOUNTER — Other Ambulatory Visit: Payer: Self-pay | Admitting: Internal Medicine

## 2021-04-11 ENCOUNTER — Telehealth: Payer: Self-pay | Admitting: Pharmacy Technician

## 2021-04-11 DIAGNOSIS — Z596 Low income: Secondary | ICD-10-CM

## 2021-04-11 NOTE — Progress Notes (Signed)
[facility])                                            [facility] Quality Pharmacy Team    04/11/2021  Beth Tyler 01/23/1946 [id number]  Received both patient and provider portion(s) of patient assistance application(s) for [name] and [employer] [name]. Faxed completed application and required documents into [employer] for [name] and [name] for [name] and [name].  Also received back patient's portion of [name] application for [name]. Faxed completed patient application and required documents into [name]. Have not received the provider's portion back but did refax Dr. Tyrone Nine office for their portion on 04/10/21.   Beth Tyler P. Beth Tyler, CPhT [facility] [employer]  [phone]

## 2021-04-12 ENCOUNTER — Other Ambulatory Visit: Payer: Self-pay

## 2021-04-12 ENCOUNTER — Ambulatory Visit
Admission: EM | Admit: 2021-04-12 | Discharge: 2021-04-12 | Disposition: A | Discharge status: Home / Self Care | Payer: Medicare HMO | Attending: Internal Medicine | Admitting: Internal Medicine

## 2021-04-12 ENCOUNTER — Encounter: Payer: Self-pay | Admitting: Emergency Medicine

## 2021-04-12 DIAGNOSIS — J029 Acute pharyngitis, unspecified: Secondary | ICD-10-CM | POA: Insufficient documentation

## 2021-04-12 DIAGNOSIS — Z20822 Contact with and (suspected) exposure to covid-19: Secondary | ICD-10-CM | POA: Insufficient documentation

## 2021-04-12 DIAGNOSIS — R059 Cough, unspecified: Secondary | ICD-10-CM | POA: Insufficient documentation

## 2021-04-12 DIAGNOSIS — Z8616 Personal history of COVID-19: Secondary | ICD-10-CM | POA: Insufficient documentation

## 2021-04-12 DIAGNOSIS — J069 Acute upper respiratory infection, unspecified: Secondary | ICD-10-CM

## 2021-04-12 LAB — RESP PANEL BY RT-PCR (FLU A&B, COVID) ARPGX2
Influenza A by PCR: NEGATIVE
Influenza B by PCR: NEGATIVE
SARS Coronavirus 2 by RT PCR: NEGATIVE

## 2021-04-12 MED ORDER — IPRATROPIUM BROMIDE 0.06 % NA SOLN: 2.0000 | Freq: Four times a day (QID) | NASAL | 12 refills | Status: AC

## 2021-04-12 MED ORDER — BENZONATATE 100 MG PO CAPS: 200.0000 mg | ORAL_CAPSULE | Freq: Three times a day (TID) | ORAL | 0 refills | Status: DC

## 2021-04-12 NOTE — Discharge Instructions (Signed)
Use the Atrovent nasal spray, 2 squirts in each nostril every 6 hours, as needed for runny nose and postnasal drip.  Use the Tessalon Perles every 8 hours during the day.  Take them with a small sip of water.  They may give you some numbness to the base of your tongue or a metallic taste in your mouth, this is normal.  Return for reevaluation or see your primary care provider for any new or worsening symptoms.  

## 2021-04-12 NOTE — ED Provider Notes (Signed)
[facility]    CSN: [id number] Arrival date & time: 04/12/21  1015      History   Chief Complaint Chief Complaint  Patient presents with   Sore Throat   Cough   Nasal Congestion    HPI Beth Tyler is a 77 y.o. female.   HPI  71 old female here for evaluation of respiratory complaints.  Patient reports that she has had a runny nose for clear nasal discharge, sore throat, and nonproductive cough for last 2 days.  She also endorses a mild headache.  She states her cough is more and associated to clear her throat versus a deeper need to expectorate sputum.  She denies fever, ear pain, shortness with wheezing, GI complaints, or body aches.  Past Medical History:  Diagnosis Date   Anemia    Arthritis    B12 deficiency    Carpal tunnel syndrome, bilateral    COVID-19 03/18/2019   Diabetes mellitus (HCC) 02/17/2012   Diabetes mellitus, type II (HCC)    Dyspnea    Gastritis    GERD (gastroesophageal reflux disease)    Hypercholesterolemia    Hypertension    IBS (irritable bowel syndrome)    Neuropathy    legs   Pernicious anemia    Vertigo    no episodes fo 9 or 10 yrs    Patient Active Problem List   Diagnosis Date Noted   Thrombophilia (HCC) 08/27/2020   Healthcare maintenance 08/23/2020   Breast cancer screening 07/09/2020   Hypercholesterolemia 08/07/2019   Change in bowel movement 08/07/2019   Benign essential hypertension 04/06/2019   Type 2 diabetes mellitus with diabetic chronic kidney disease (HCC) 04/06/2019   Paroxysmal A-fib (HCC) 04/06/2019   Aortic atherosclerosis (HCC) 10/22/2016   Back pain 05/23/2015   Cough 02/13/2015   Colon cancer screening 01/19/2014    Past Surgical History:  Procedure Laterality Date   ANTERIOR VITRECTOMY Right 12/01/2017   Procedure: ANTERIOR VITRECTOMY;  Surgeon: Nevada Crane, MD;  Location: [facility];  Service: Ophthalmology;  Laterality: Right;   BREAST BIOPSY Right 1980   neg    BREAST SURGERY  1980   benign cyst removal   CATARACT EXTRACTION W/PHACO Left 10/13/2017   Procedure: CATARACT EXTRACTION PHACO AND INTRAOCULAR LENS PLACEMENT (IOC)  LEFT  DIABETES;  Surgeon: Nevada Crane, MD;  Location: [facility];  Service: Ophthalmology;  Laterality: Left;  Diabetic - insulin and oral meds   CATARACT EXTRACTION W/PHACO Right 12/01/2017   Procedure: CATARACT EXTRACTION PHACO AND INTRAOCULAR LENS PLACEMENT (IOC)  RIGHT DIABETIC;  Surgeon: Nevada Crane, MD;  Location: [facility];  Service: Ophthalmology;  Laterality: Right;  Diabetic - insulin and oral   CHOLECYSTECTOMY  1983    OB History   No obstetric history on file.      Home Medications    Prior to Admission medications   Medication Sig Start Date End Date Taking? Authorizing Provider  benzonatate (TESSALON) 100 MG capsule Take 2 capsules (200 mg total) by mouth every 8 (eight) hours. 04/12/21  Yes Becky Augusta, NP  ipratropium (ATROVENT) 0.06 % nasal spray Place 2 sprays into both nostrils 4 (four) times daily. 04/12/21  Yes Becky Augusta, NP  acetaminophen (TYLENOL) 500 MG tablet Take 500-1,000 mg by mouth every 6 (six) hours as needed for mild pain or moderate pain.     [provider]  amLODipine (NORVASC) 10 MG tablet Take 1 tablet by mouth once daily 04/03/21  Einar Pheasant, MD  apixaban (ELIQUIS) 5 MG TABS tablet Take 1 tablet (5 mg total) by mouth 2 (two) times daily. 07/05/20   Einar Pheasant, MD  BD PEN NEEDLE NANO 2ND GEN 32G X 4 MM MISC USE TO INJECT INSULIN THREE TIMES DAILY 03/15/21   Einar Pheasant, MD  Continuous Blood Gluc Sensor (FREESTYLE LIBRE 2 SENSOR) MISC USE TO CHECK BLOOD SUGARS AT LEAST 4 TIMES DAILY 03/14/21   Einar Pheasant, MD  empagliflozin ([name]) 25 MG TABS tablet Take 1 tablet (25 mg total) by mouth daily before breakfast. 12/14/19   Einar Pheasant, MD  ferrous sulfate 325 (65 FE) MG tablet Take 325 mg by mouth daily with breakfast.    [provider]  fluticasone (CUTIVATE) 0.05 % cream Apply 1 application topically as needed. Use as directed. 06/02/20   Einar Pheasant, MD  glucose blood [facility] VERIO) test strip USE AS DIRECTED 01/18/21   Einar Pheasant, MD  Insulin Glargine [facility]) 100 UNIT/ML Inject 22 Units into the skin every morning. 03/14/21   Einar Pheasant, MD  insulin lispro ([name] KWIKPEN) 100 UNIT/ML KwikPen Inject 8-14 Units into the skin 3 (three) times daily. 03/14/21   Einar Pheasant, MD  magnesium oxide (MAG-OX) 400 (240 Mg) MG tablet Take 1 tablet by mouth once daily 02/12/21   Einar Pheasant, MD  metFORMIN (GLUCOPHAGE-XR) 500 MG 24 hr tablet Take 2 tablets (1,000 mg total) by mouth 2 (two) times daily. 12/26/20   Einar Pheasant, MD  metoprolol tartrate (LOPRESSOR) 25 MG tablet Take 25 mg by mouth 2 (two) times daily.    [provider]  Multiple Vitamin (MULTIVITAMIN) tablet Take 1 tablet by mouth daily.    [provider]  Pancrelipase, Lip-Prot-Amyl, ([name] PO) Take by mouth. Take 36,000-72,000 Units by mouth See admin instructions. Take 3 capsules by mouth three times daily with meals and take 2 capsule by mouth daily with snacks    [provider]  pantoprazole (PROTONIX) 40 MG tablet TAKE 1 TABLET BY MOUTH TWICE DAILY BEFORE MEAL(S) 04/06/21   Einar Pheasant, MD  simvastatin (ZOCOR) 10 MG tablet TAKE 1 TABLET BY MOUTH AT BEDTIME 04/06/21   Einar Pheasant, MD  triamcinolone (KENALOG) 0.1 % APPLY  CREAM EXTERNALLY TWICE DAILY 03/06/20   Einar Pheasant, MD  vitamin B-12 (CYANOCOBALAMIN) 100 MCG tablet Take 100 mcg by mouth daily.    [provider]    Family History Family History  Problem Relation Age of Onset   Diabetes Father    Kidney disease Father    Heart disease Father    Alcohol abuse Mother    Dementia Mother    Diabetes Mother    Diabetes Other    Hemophilia Brother    Uterine cancer Sister    Diabetes Sister    COPD Sister     Breast cancer Maternal Aunt 66   Breast cancer Daughter 77   Stroke Daughter    Diabetes Son     Social History Social History   Tobacco Use   Smoking status: Never   Smokeless tobacco: Never  Vaping Use   Vaping Use: Never used  Substance Use Topics   Alcohol use: No    Alcohol/week: 0.0 standard drinks   Drug use: No     Allergies   Hydrocodone, Hydrocodone-acetaminophen, Oxycodone-acetaminophen, Percocet [oxycodone-acetaminophen], Demeclocycline, Other, Penicillins, and Tetracyclines & related   Review of Systems Review of Systems  Constitutional:  Negative for activity change, appetite change and fever.  HENT:  Positive for congestion, postnasal drip, rhinorrhea and sore throat. Negative for ear pain.   Respiratory:  Positive for cough. Negative for shortness of breath and wheezing.   Gastrointestinal:  Negative for diarrhea, nausea and vomiting.  Skin:  Negative for rash.  Hematological: Negative.   Psychiatric/Behavioral: Negative.      Physical Exam Triage Vital Signs ED Triage Vitals  Enc Vitals Group     BP 04/12/21 1128 (!) 147/64     Pulse Rate 04/12/21 1128 64     Resp 04/12/21 1128 16     Temp 04/12/21 1128 98.1 F (36.7 C)     Temp Source 04/12/21 1128 Oral     SpO2 04/12/21 1128 97 %     Weight 04/12/21 1127 197 lb 8.5 oz (89.6 kg)     Height 04/12/21 1127 5\' 2"  (1.575 m)     Head Circumference --      Peak Flow --      Pain Score 04/12/21 1126 6     Pain Loc --      Pain Edu? --      Excl. in [place]? --    No data found.  Updated Vital Signs BP (!) 147/64 (BP Location: Left Arm)    Pulse 64    Temp 98.1 F (36.7 C) (Oral)    Resp 16    Ht 5\' 2"  (1.575 m)    Wt 197 lb 8.5 oz (89.6 kg)    SpO2 97%    BMI 36.13 kg/m   Visual Acuity Right Eye Distance:   Left Eye Distance:   Bilateral Distance:    Right Eye Near:   Left Eye Near:    Bilateral Near:     Physical Exam Vitals and nursing note reviewed.  Constitutional:      General: She  is not in acute distress.    Appearance: Normal appearance. She is not ill-appearing.  HENT:     Head: Normocephalic and atraumatic.     Right Ear: Tympanic membrane, ear canal and external ear normal. There is no impacted cerumen.     Left Ear: Tympanic membrane, ear canal and external ear normal. There is no impacted cerumen.     Nose: Congestion and rhinorrhea present.     Comments: Nasal mucosa is edematous and pale with scant clear discharge in both nares.    Mouth/Throat:     Mouth: Mucous membranes are moist.     Pharynx: Oropharynx is clear. Posterior oropharyngeal erythema present.     Comments: Posterior oropharynx is mildly erythematous with clear postnasal drip. Cardiovascular:     Rate and Rhythm: Normal rate and regular rhythm.     Pulses: Normal pulses.     Heart sounds: Normal heart sounds. No murmur heard.   No friction rub. No gallop.  Pulmonary:     Effort: Pulmonary effort is normal.     Breath sounds: Normal breath sounds. No wheezing, rhonchi or rales.  Musculoskeletal:     Cervical back: Normal range of motion and neck supple.  Lymphadenopathy:     Cervical: No cervical adenopathy.  Skin:    General: Skin is warm and dry.     Capillary Refill: Capillary refill takes less than 2 seconds.     Findings: No erythema or rash.  Neurological:     General: No focal deficit present.     Mental Status: She is alert and oriented to person, place, and time.  Psychiatric:  Mood and Affect: Mood normal.        Behavior: Behavior normal.        Thought Content: Thought content normal.        Judgment: Judgment normal.     UC Treatments / Results  Labs (all labs ordered are listed, but only abnormal results are displayed) Labs Reviewed  RESP PANEL BY RT-PCR (FLU A&B, COVID) [id number]    EKG   Radiology No results found.  Procedures Procedures (including critical care time)  Medications Ordered in UC Medications - No data to display  Initial  Impression / Assessment and Plan / UC Course  I have reviewed the triage vital signs and the nursing notes.  Pertinent labs & imaging results that were available during my care of the patient were reviewed by me and considered in my medical decision making (see chart for details).  Toxic appearing 76 year old female here for evaluation of respiratory complaints as outlined HPI above.  Her physical exam reveals pearly gray tympanic membranes bilaterally with normal light reflex.  Both external auditory canals are moderately ceruminous.  Nasal mucosa is pale and edematous with scant clear discharge in both nares.  Oropharyngeal exam reveals mild posterior oropharyngeal erythema with clear postnasal drip.  No cervical lymphadenopathy appreciable exam.  Cardiopulmonary exam feels clear lung sounds in all fields.  Respiratory triplex panel was collected at triage and she is negative for COVID or influenza.  Patient exam is consistent with viral URI versus allergic rhinitis.  Patient indicates that she is never had allergy symptoms in the past but her pale, bluish hue nasal mucosa has more of an allergic rhinitis presentation.  We will treat with Atrovent nasal spray to see if improves her symptoms and will give Tessalon Perles to help with cough as needed.  Patient advised to return for new or worsening symptoms.   Final Clinical Impressions(s) / UC Diagnoses   Final diagnoses:  Upper respiratory tract infection, unspecified type     Discharge Instructions      Use the Atrovent nasal spray, 2 squirts in each nostril every 6 hours, as needed for runny nose and postnasal drip.  Use the Tessalon Perles every 8 hours during the day.  Take them with a small sip of water.  They may give you some numbness to the base of your tongue or a metallic taste in your mouth, this is normal.  Return for reevaluation or see your primary care provider for any new or worsening symptoms.      ED Prescriptions      Medication Sig Dispense Auth. Provider   ipratropium (ATROVENT) 0.06 % nasal spray Place 2 sprays into both nostrils 4 (four) times daily. 15 mL Margarette Canada, NP   benzonatate (TESSALON) 100 MG capsule Take 2 capsules (200 mg total) by mouth every 8 (eight) hours. 21 capsule Margarette Canada, NP      PDMP not reviewed this encounter.   Margarette Canada, NP 04/12/21 1242

## 2021-04-12 NOTE — ED Triage Notes (Signed)
Pt c/o cough, runny nose, sore throat x 2 days.

## 2021-04-13 ENCOUNTER — Ambulatory Visit: Payer: Medicare HMO

## 2021-04-14 ENCOUNTER — Ambulatory Visit: Payer: Medicare HMO

## 2021-04-16 ENCOUNTER — Ambulatory Visit (INDEPENDENT_AMBULATORY_CARE_PROVIDER_SITE_OTHER): Payer: Medicare HMO

## 2021-04-16 ENCOUNTER — Ambulatory Visit
Admission: EM | Admit: 2021-04-16 | Discharge: 2021-04-16 | Disposition: A | Discharge status: Home / Self Care | Payer: Medicare HMO | Attending: Emergency Medicine | Admitting: Emergency Medicine

## 2021-04-16 ENCOUNTER — Other Ambulatory Visit: Payer: Self-pay

## 2021-04-16 DIAGNOSIS — R059 Cough, unspecified: Secondary | ICD-10-CM

## 2021-04-16 DIAGNOSIS — J069 Acute upper respiratory infection, unspecified: Secondary | ICD-10-CM | POA: Diagnosis not present

## 2021-04-16 MED ORDER — ALBUTEROL SULFATE HFA 108 (90 BASE) MCG/ACT IN AERS: 2.0000 | INHALATION_SPRAY | RESPIRATORY_TRACT | 0 refills | Status: DC | PRN

## 2021-04-16 NOTE — Discharge Instructions (Signed)
Your chest x-ray was negative for infection, structural changes or fluid  Begin use of over-the-counter Mucinex DM to help thin secretions and also calm your coughing  You may continue use of Tessalon pill in addition  You may also try use of a humidifier overnight, sitting in a steamed bathroom to help moisten airways, please avoid use of direct heat as this will dry you out and make symptoms worse  You may use albuterol inhaler taking 2 puffs every 4-6 hours as needed when having sensation of difficulty getting a deep breath  You may follow-up with urgent care as needed

## 2021-04-16 NOTE — ED Provider Notes (Signed)
[facility]    CSN: [id number] Arrival date & time: 04/16/21  1615      History   Chief Complaint Chief Complaint  Patient presents with   Cough    HPI Beth Tyler is a 76 y.o. female.   Patient presents with nasal congestion and cough for 6 days.  Has new sensation of feeling as if she cannot take a good deep breath.  Initially had accompanying sore throat which has now resolved.  Has not attempted treatment of new symptoms.  Has been using Tessalon pill which has not been effective for cough.  Denies chest pain or tightness, shortness of breath with exertion, wheezing, fever, chills.  Tested negative for COVID and flu urgent care visit on 04/12/2021.  History of arthritis, GERD, hypertension, IBS, neuropathy, vertigo.  Past Medical History:  Diagnosis Date   Anemia    Arthritis    B12 deficiency    Carpal tunnel syndrome, bilateral    COVID-19 03/18/2019   Diabetes mellitus ([place]) 02/17/2012   Diabetes mellitus, type II ([place])    Dyspnea    Gastritis    GERD (gastroesophageal reflux disease)    Hypercholesterolemia    Hypertension    IBS (irritable bowel syndrome)    Neuropathy    legs   Pernicious anemia    Vertigo    no episodes fo 9 or 10 yrs    Patient Active Problem List   Diagnosis Date Noted   Thrombophilia ([place]) 08/27/2020   Healthcare maintenance 08/23/2020   Breast cancer screening 07/09/2020   Hypercholesterolemia 08/07/2019   Change in bowel movement 08/07/2019   Benign essential hypertension 04/06/2019   Type 2 diabetes mellitus with diabetic chronic kidney disease ([place]) 04/06/2019   Paroxysmal A-fib ([place]) 04/06/2019   Aortic atherosclerosis ([place]) 10/22/2016   Back pain 05/23/2015   Cough 02/13/2015   Colon cancer screening 01/19/2014    Past Surgical History:  Procedure Laterality Date   ANTERIOR VITRECTOMY Right 12/01/2017   Procedure: ANTERIOR VITRECTOMY;  Surgeon: Eulogio Bear, MD;  Location: [place];   Service: Ophthalmology;  Laterality: Right;   BREAST BIOPSY Right 1980   neg   BREAST SURGERY  1980   benign cyst removal   CATARACT EXTRACTION W/PHACO Left 10/13/2017   Procedure: CATARACT EXTRACTION PHACO AND INTRAOCULAR LENS PLACEMENT ([place])  LEFT  DIABETES;  Surgeon: Eulogio Bear, MD;  Location: [place];  Service: Ophthalmology;  Laterality: Left;  Diabetic - insulin and oral meds   CATARACT EXTRACTION W/PHACO Right 12/01/2017   Procedure: CATARACT EXTRACTION PHACO AND INTRAOCULAR LENS PLACEMENT ([place])  RIGHT DIABETIC;  Surgeon: Eulogio Bear, MD;  Location: [place];  Service: Ophthalmology;  Laterality: Right;  Diabetic - insulin and oral   CHOLECYSTECTOMY  1983    OB History   No obstetric history on file.      Home Medications    Prior to Admission medications   Medication Sig Start Date End Date Taking? Authorizing Provider  acetaminophen (TYLENOL) 500 MG tablet Take 500-1,000 mg by mouth every 6 (six) hours as needed for mild pain or moderate pain.    Yes [provider]  amLODipine (NORVASC) 10 MG tablet Take 1 tablet by mouth once daily 04/03/21  Yes Einar Pheasant, MD  apixaban (ELIQUIS) 5 MG TABS tablet Take 1 tablet (5 mg total) by mouth 2 (two) times daily. 07/05/20  Yes Einar Pheasant, MD  BD PEN NEEDLE NANO 2ND GEN 32G X 4 MM  MISC USE TO INJECT INSULIN THREE TIMES DAILY 03/15/21  Yes Einar Pheasant, MD  benzonatate (TESSALON) 100 MG capsule Take 2 capsules (200 mg total) by mouth every 8 (eight) hours. 04/12/21  Yes Margarette Canada, NP  Continuous Blood Gluc Sensor (FREESTYLE LIBRE 2 SENSOR) MISC USE TO CHECK BLOOD SUGARS AT LEAST 4 TIMES DAILY 03/14/21  Yes Einar Pheasant, MD  empagliflozin ([name]) 25 MG TABS tablet Take 1 tablet (25 mg total) by mouth daily before breakfast. 12/14/19  Yes Einar Pheasant, MD  ferrous sulfate 325 (65 FE) MG tablet Take 325 mg by mouth daily with breakfast.   Yes [provider]  fluticasone  (CUTIVATE) 0.05 % cream Apply 1 application topically as needed. Use as directed. 06/02/20  Yes Einar Pheasant, MD  glucose blood [facility] VERIO) test strip USE AS DIRECTED 01/18/21  Yes Einar Pheasant, MD  Insulin Glargine (BASAGLAR KWIKPEN) 100 UNIT/ML Inject 22 Units into the skin every morning. 03/14/21  Yes Einar Pheasant, MD  insulin lispro ([name] KWIKPEN) 100 UNIT/ML KwikPen Inject 8-14 Units into the skin 3 (three) times daily. 03/14/21  Yes Einar Pheasant, MD  ipratropium (ATROVENT) 0.06 % nasal spray Place 2 sprays into both nostrils 4 (four) times daily. 04/12/21  Yes Margarette Canada, NP  magnesium oxide (MAG-OX) 400 (240 Mg) MG tablet Take 1 tablet by mouth once daily 02/12/21  Yes Einar Pheasant, MD  metFORMIN (GLUCOPHAGE-XR) 500 MG 24 hr tablet Take 2 tablets (1,000 mg total) by mouth 2 (two) times daily. 12/26/20  Yes Einar Pheasant, MD  metoprolol tartrate (LOPRESSOR) 25 MG tablet Take 25 mg by mouth 2 (two) times daily.   Yes [provider]  Multiple Vitamin (MULTIVITAMIN) tablet Take 1 tablet by mouth daily.   Yes [provider]  Pancrelipase, Lip-Prot-Amyl, ([name] PO) Take by mouth. Take 36,000-72,000 Units by mouth See admin instructions. Take 3 capsules by mouth three times daily with meals and take 2 capsule by mouth daily with snacks   Yes [provider]  pantoprazole (PROTONIX) 40 MG tablet TAKE 1 TABLET BY MOUTH TWICE DAILY BEFORE MEAL(S) 04/06/21  Yes Einar Pheasant, MD  simvastatin (ZOCOR) 10 MG tablet TAKE 1 TABLET BY MOUTH AT BEDTIME 04/06/21  Yes Einar Pheasant, MD  triamcinolone (KENALOG) 0.1 % APPLY  CREAM EXTERNALLY TWICE DAILY 03/06/20  Yes Einar Pheasant, MD  vitamin B-12 (CYANOCOBALAMIN) 100 MCG tablet Take 100 mcg by mouth daily.   Yes [provider]    Family History Family History  Problem Relation Age of Onset   Diabetes Father    Kidney disease Father    Heart disease Father    Alcohol abuse Mother    Dementia  Mother    Diabetes Mother    Diabetes Other    Hemophilia Brother    Uterine cancer Sister    Diabetes Sister    COPD Sister    Breast cancer Maternal Aunt 24   Breast cancer Daughter 66   Stroke Daughter    Diabetes Son     Social History Social History   Tobacco Use   Smoking status: Never   Smokeless tobacco: Never  Vaping Use   Vaping Use: Never used  Substance Use Topics   Alcohol use: No    Alcohol/week: 0.0 standard drinks   Drug use: No     Allergies   Hydrocodone, Hydrocodone-acetaminophen, Oxycodone-acetaminophen, Percocet [oxycodone-acetaminophen], Demeclocycline, Other, Penicillins, and Tetracyclines & related   Review of Systems Review of Systems  Constitutional: Negative.   HENT:  Negative.    Respiratory:  Positive for cough. Negative for apnea, choking, chest tightness, shortness of breath, wheezing and stridor.   Cardiovascular: Negative.   Skin: Negative.   Neurological: Negative.     Physical Exam Triage Vital Signs ED Triage Vitals  Enc Vitals Group     BP 04/16/21 1638 (!) 181/83     Pulse Rate 04/16/21 1638 74     Resp 04/16/21 1638 16     Temp 04/16/21 1638 99.1 F (37.3 C)     Temp Source 04/16/21 1638 Oral     SpO2 04/16/21 1638 98 %     Weight 04/16/21 1633 197 lb 8.5 oz (89.6 kg)     Height 04/16/21 1633 5\' 2"  (1.575 m)     Head Circumference --      Peak Flow --      Pain Score 04/16/21 1638 2     Pain Loc --      Pain Edu? --      Excl. in [place]? --    No data found.  Updated Vital Signs BP (!) 181/83 (BP Location: Right Arm)    Pulse 74    Temp 99.1 F (37.3 C) (Oral)    Resp 16    Ht 5\' 2"  (1.575 m)    Wt 197 lb 8.5 oz (89.6 kg)    SpO2 98%    BMI 36.13 kg/m   Visual Acuity Right Eye Distance:   Left Eye Distance:   Bilateral Distance:    Right Eye Near:   Left Eye Near:    Bilateral Near:     Physical Exam Constitutional:      Appearance: Normal appearance.  HENT:     Head: Normocephalic.  Eyes:      Extraocular Movements: Extraocular movements intact.  Cardiovascular:     Rate and Rhythm: Normal rate and regular rhythm.     Pulses: Normal pulses.     Heart sounds: Normal heart sounds.  Pulmonary:     Effort: Pulmonary effort is normal.     Breath sounds: Normal breath sounds.  Musculoskeletal:     Cervical back: Normal range of motion and neck supple.  Skin:    General: Skin is warm and dry.  Neurological:     Mental Status: She is alert and oriented to person, place, and time. Mental status is at baseline.  Psychiatric:        Mood and Affect: Mood normal.        Behavior: Behavior normal.     UC Treatments / Results  Labs (all labs ordered are listed, but only abnormal results are displayed) Labs Reviewed - No data to display  EKG   Radiology No results found.  Procedures Procedures (including critical care time)  Medications Ordered in UC Medications - No data to display  Initial Impression / Assessment and Plan / UC Course  I have reviewed the triage vital signs and the nursing notes.  Pertinent labs & imaging results that were available during my care of the patient were reviewed by me and considered in my medical decision making (see chart for details).  Viral URI with cough  Chest x-ray negative, O2 saturation 98% on room air, patient has oximeter to monitor at home and will do so as needed, as vital signs are stable and patient is in no signs of distress, will continue to treat outpatient, prescribed albuterol inhaler, recommended use of Mucinex DM and continuation of Tessalon pill for management of  symptoms, may follow-up with urgent care as needed Final Clinical Impressions(s) / UC Diagnoses   Final diagnoses:  None   Discharge Instructions   None    ED Prescriptions   None    PDMP not reviewed this encounter.   Hans Eden, [place] 04/16/21 1806

## 2021-04-16 NOTE — ED Triage Notes (Signed)
Pt here with C/O cough, was seen here 4 days ago and DX with URI. States she has been having upper back pain.

## 2021-04-17 ENCOUNTER — Ambulatory Visit: Payer: Medicare HMO

## 2021-04-20 ENCOUNTER — Telehealth: Payer: Self-pay | Admitting: Pharmacy Technician

## 2021-04-20 ENCOUNTER — Telehealth: Payer: Self-pay | Admitting: Internal Medicine

## 2021-04-20 ENCOUNTER — Ambulatory Visit: Payer: Medicare HMO | Admitting: Pharmacist

## 2021-04-20 DIAGNOSIS — E139 Other specified diabetes mellitus without complications: Secondary | ICD-10-CM

## 2021-04-20 DIAGNOSIS — E1140 Type 2 diabetes mellitus with diabetic neuropathy, unspecified: Secondary | ICD-10-CM

## 2021-04-20 DIAGNOSIS — Z596 Low income: Secondary | ICD-10-CM

## 2021-04-20 DIAGNOSIS — E7800 Pure hypercholesterolemia, unspecified: Secondary | ICD-10-CM

## 2021-04-20 NOTE — Telephone Encounter (Signed)
Pt is calling about medication pancrelipase ([name]). Pt stated she did not get the medication in December or January

## 2021-04-20 NOTE — Chronic Care Management (AMB) (Signed)
Chronic Care Management CCM Pharmacy Note  04/20/2021 Name:  Beth Tyler MRN:  [id number] DOB:  [birth year]  Summary: -Provider portion for [name] application has not been received by Dina Rich  Recommendations/Changes made from today's visit: - Collaborating with PCP to send today  Subjective: Beth Tyler is an 76 y.o. year old female who is a primary patient of Einar Pheasant, MD.  The CCM team was consulted for assistance with disease management and care coordination needs.    Engaged with patient by telephone for follow up visit for pharmacy case management and/or care coordination services.   Objective:  Medications Reviewed Today     Reviewed by Hiott, Tanda Rockers, CMA (Certified Medical Assistant) on 04/16/21 at 1633  Med List Status: <None>   Medication Order Taking? Sig Documenting Provider Last Dose Status Informant  acetaminophen (TYLENOL) 500 MG tablet [id number] Yes Take 500-1,000 mg by mouth every 6 (six) hours as needed for mild pain or moderate pain.  [provider] 04/16/2021 Active Self           Med Note Darnelle Maffucci, Kiyla Ringler E   Wed Mar 14, 2021 12:58 PM) Taking 1000 mg Q6H  amLODipine (NORVASC) 10 MG tablet [id number] Yes Take 1 tablet by mouth once daily Einar Pheasant, MD 04/16/2021 Active   apixaban (ELIQUIS) 5 MG TABS tablet [id number] Yes Take 1 tablet (5 mg total) by mouth 2 (two) times daily. Einar Pheasant, MD 04/16/2021 Active   BD PEN NEEDLE NANO 2ND GEN 32G X 4 MM MISC [id number] Yes USE TO [facility] INSULIN THREE TIMES DAILY Einar Pheasant, MD 04/16/2021 Active   benzonatate (TESSALON) 100 MG capsule [id number] Yes Take 2 capsules (200 mg total) by mouth every 8 (eight) hours. Margarette Canada, NP 04/16/2021 Active   Continuous Blood Gluc Sensor (FREESTYLE LIBRE 2 SENSOR) MISC [id number] Yes USE TO CHECK BLOOD SUGARS AT LEAST 4 TIMES DAILY Einar Pheasant, MD 04/16/2021 Active   empagliflozin ([name]) 25 MG TABS tablet [id number] Yes Take 1 tablet (25 mg total)  by mouth daily before breakfast. Einar Pheasant, MD 04/16/2021 Active   ferrous sulfate 325 (65 FE) MG tablet [id number] Yes Take 325 mg by mouth daily with breakfast. [provider] 04/16/2021 Active   fluticasone (CUTIVATE) 0.05 % cream [id number] Yes Apply 1 application topically as needed. Use as directed. Einar Pheasant, MD 04/16/2021 Active   glucose blood [facility] VERIO) test strip [id number] Yes USE AS DIRECTED Einar Pheasant, MD 04/16/2021 Active   Insulin Glargine (BASAGLAR KWIKPEN) 100 UNIT/ML [id number] Yes Inject 22 Units into the skin every morning. Einar Pheasant, MD 04/16/2021 Active   insulin lispro ([name] KWIKPEN) 100 UNIT/ML KwikPen [id number] Yes Inject 8-14 Units into the skin 3 (three) times daily. Einar Pheasant, MD 04/16/2021 Active   ipratropium (ATROVENT) 0.06 % nasal spray [id number] Yes Place 2 sprays into both nostrils 4 (four) times daily. Margarette Canada, NP 04/16/2021 Active   magnesium oxide (MAG-OX) 400 (240 Mg) MG tablet [id number] Yes Take 1 tablet by mouth once daily Einar Pheasant, MD 04/16/2021 Active   metFORMIN (GLUCOPHAGE-XR) 500 MG 24 hr tablet [id number] Yes Take 2 tablets (1,000 mg total) by mouth 2 (two) times daily. Einar Pheasant, MD 04/16/2021 Active   metoprolol tartrate (LOPRESSOR) 25 MG tablet [id number] Yes Take 25 mg by mouth 2 (two) times daily. [provider] 04/16/2021 Active   Multiple Vitamin (MULTIVITAMIN) tablet [id number] Yes Take 1 tablet by mouth daily. [provider] 04/16/2021 Active   Pancrelipase, Lip-Prot-Amyl, ([name] PO) [id number] Yes Take  by mouth. Take 36,000-72,000 Units by mouth See admin instructions. Take 3 capsules by mouth three times daily with meals and take 2 capsule by mouth daily with snacks [provider] 04/16/2021 Active   pantoprazole (PROTONIX) 40 MG tablet [id number] Yes TAKE 1 TABLET BY MOUTH TWICE DAILY BEFORE MEAL(S) Einar Pheasant, MD 04/16/2021 Active   simvastatin (ZOCOR) 10 MG tablet [id number] Yes  TAKE 1 TABLET BY MOUTH AT BEDTIME Einar Pheasant, MD 04/16/2021 Active   triamcinolone (KENALOG) 0.1 % [id number] Yes APPLY  CREAM EXTERNALLY TWICE DAILY Einar Pheasant, MD 04/16/2021 Active   vitamin B-12 (CYANOCOBALAMIN) 100 MCG tablet [id number] Yes Take 100 mcg by mouth daily. [provider] 04/16/2021 Active             Pertinent Labs:   Lab Results  Component Value Date   HGBA1C 7.7 (H) 02/16/2021   Lab Results  Component Value Date   CHOL 113 02/16/2021   HDL 52.20 02/16/2021   LDLCALC 42 02/16/2021   LDLDIRECT 74.4 02/21/2012   TRIG 93.0 02/16/2021   CHOLHDL 2 02/16/2021   Lab Results  Component Value Date   CREATININE 0.93 02/16/2021   BUN 22 02/16/2021   NA 142 02/16/2021   K 4.3 02/16/2021   CL 105 02/16/2021   CO2 29 02/16/2021    SDOH:  (Social Determinants of Health) assessments and interventions performed:  SDOH Interventions    Flowsheet Row Most Recent Value  SDOH Interventions   Financial Strain Interventions Other (Comment)  [manufacturer assistance]       CCM Care Plan  Review of patient past medical history, allergies, medications, health status, including review of consultants reports, laboratory and other test data, was performed as part of comprehensive evaluation and provision of chronic care management services.   Care Plan : Medication Management  Updates made by De Hollingshead, RPH-CPP since 04/20/2021 12:00 AM     Problem: Diabetes, CKD, IBS      Long-Range Goal: Disease Progression Prevention   Recent Progress: On track  Priority: High  Note:   Current Barriers:  Unable to independently afford treatment regimen Unable to achieve control of diabetes   Pharmacist Clinical Goal(s):  Over the next 90 days, patient will verbalize ability to afford treatment regimen. Over the next 90 days, patient will achieve control of diabetes as evidenced by improvement in A1c through collaboration with PharmD and provider.    Interventions: 1:1 collaboration with Einar Pheasant, MD regarding development and update of comprehensive plan of care as evidenced by provider attestation and co-signature Inter-disciplinary care team collaboration (see longitudinal plan of care) Comprehensive medication review performed; medication list updated in electronic medical record  Health Maintenance Yearly diabetic eye exam: up to date Yearly diabetic foot exam: up to date Urine microalbumin: up to date Yearly influenza vaccination: up to date   Td/Tdap vaccination: due - previously postponed Pneumonia vaccination: up to date COVID vaccinations: due - declined Shingrix vaccinations: due - recommended to pursue  Colonoscopy: up to date  Diabetes: Uncontrolled; current treatment: metformin XR 1000 mg BID, [name] 25 mg daily, Basaglar 22 units daily, [name] if >150, she does 10; 8-10 units up to three times daily with meals Avoiding GLP1 d/t concurrent IBS-D, pancreatic enzyme insufficiency and baseline GI concerns Most recent weight: 189 lbs Approved for assistance for Basaglar, [name], and [name] through 2022 Previously recommended to continue current regimen at this time  Hypertension/Atrial Fibrillation: Appropriately managed; current rate control: metoprolol tartrate 25 mg BID; anticoagulant treatment: Eliquis  5 mg BID; additional antihypertensive regimen: amlodipine 10 mg daily; follows w/ cardiology Dr. Saralyn Pilar Approved for Eliquis assistance through 04/07/21. Has to meet out  Previously recommended to continue current regimen at this time.   Hyperlipidemia: Controlled per last lipid panel; current treatment: simvastatin 10 mg daily  Previously recommended to continue current regimen at this time  IBS w/ pancreatic enzyme insufficiency: Appropriately managed; Current regimen: [name] 3 capsules with meals and 2 with snacks. Follows w/ [facility] Re-enrolled for [name] assistance for 2022. Per Dina Rich,  provider portion of [name] application was not received. Will collaborate w/ PCP to send provider portion today.   GERD: Managed appropriate; current regimen: pantoprazole 40 mg twice daily; follows w/ [facility] Recommend to continue current regimen at this time along with collaboration with GI.  Supplement: Ferrous sulfate 325 mg daily, Vitamin B12, multivitamin   Patient Goals/Self-Care Activities Over the next 90 days, patient will:  - take medications as prescribed check blood glucose at least three times daily using CGM, document, and provide at future appointments collaborate with provider on medication access solutions      Plan: Telephone follow up appointment with care management team member scheduled for:  2 weeks as previously scheduled  Catie Darnelle Maffucci, PharmD, [place], [place] Pharmacist [employer] at [employer] [phone]

## 2021-04-20 NOTE — Telephone Encounter (Signed)
See CCM note. CPhT called patient

## 2021-04-20 NOTE — Patient Instructions (Signed)
Visit Information  Following are the goals we discussed today:    Patient Goals/Self-Care Activities Over the next 90 days, patient will:  - take medications as prescribed check blood glucose at least three times daily using CGM, document, and provide at future appointments collaborate with provider on medication access solutions        Plan: Telephone follow up appointment with care management team member scheduled for:  2 weeks as previously scheduled   Catie Feliz Beam, PharmD, [place], CPP Clinical Pharmacist [facility] at [facility] [phone]   Please call the care guide team at [phone] if you need to cancel or reschedule your appointment.   Patient verbalizes understanding of instructions and care plan provided today and agrees to view in MyChart. Active MyChart status confirmed with patient.

## 2021-04-20 NOTE — Progress Notes (Addendum)
ADDENDUM  Received provider's portion of [name] application from Dr.Scott and submitted to [name] today 04/24/21.  Camelia Stelzner P. Graylyn Bunney, CPhT [facility]  [phone]                                            [facility])                                            [facility] Quality Pharmacy Team    04/20/2021  PANZY BUBECK [birth year] [id number]  Received request from embedded PharmD to check on patient's [name] with [name] as patient had called into the clinic.  Care coordination call placed to [name]. Spoke to [place] who informs  they are in need of the healthcare provider's portion of the application in order for processing to continue for 2023. She informs as of right now patient is not approved for 2023 and will not be able receive any medication until the provider's part of the application is received.  Successful outreach call placed to patient, HIPAA verified. Updated patient on this information and informed that embedded PharmD would be working on gathering the provider's part of the application. Patient verbalized understanding.  Nyomi Howser P. Dat Derksen, CPhT [facility] [employer] [facility]  [phone]

## 2021-05-02 ENCOUNTER — Telehealth: Payer: Self-pay | Admitting: Pharmacist

## 2021-05-02 ENCOUNTER — Telehealth: Payer: Self-pay | Admitting: Pharmacy Technician

## 2021-05-02 ENCOUNTER — Ambulatory Visit (INDEPENDENT_AMBULATORY_CARE_PROVIDER_SITE_OTHER): Payer: Medicare HMO | Admitting: Pharmacist

## 2021-05-02 DIAGNOSIS — I700 Atherosclerosis of aorta: Secondary | ICD-10-CM

## 2021-05-02 DIAGNOSIS — Z596 Low income: Secondary | ICD-10-CM

## 2021-05-02 DIAGNOSIS — E139 Other specified diabetes mellitus without complications: Secondary | ICD-10-CM

## 2021-05-02 DIAGNOSIS — I480 Paroxysmal atrial fibrillation: Secondary | ICD-10-CM

## 2021-05-02 DIAGNOSIS — Z794 Long term (current) use of insulin: Secondary | ICD-10-CM

## 2021-05-02 NOTE — Patient Instructions (Signed)
Beth Tyler,   Keep up the great work!  The [employer] Patient Assistance Program has an auto-refill program where you do not need to call and request a refill each time. You should receive your medication shipment before you run out of your medication. However, if you have any issues or need assistance, you can call them at [phone]. They are available Monday though Friday 8:00 AM - 5:00 PM.   For prescription refills through the [facility] Patient [facility], call [phone]. Once you have been enrolled in the program, your prescriptions can easily be refilled by contacting the phone number above Monday though Friday 8:30 AM - 6:00 PM.    For prescription refills through the [facility], call [phone]. Once you have been enrolled in the program, your prescriptions can easily be refilled by contacting the phone number above Monday though Friday 8:00 AM - 8:00 PM.   Take care!  Catie Feliz Beam, PharmD  Visit Information  Following are the goals we discussed today:  Patient Goals/Self-Care Activities Over the next 90 days, patient will:  - take medications as prescribed check blood glucose at least three times daily using CGM, document, and provide at future appointments collaborate with provider on medication access solutions        Plan: Telephone follow up appointment with care management team member scheduled for:  4 weeks   Catie Feliz Beam, PharmD, [place], CPP Clinical Pharmacist [facility] at [facility] [phone]     Please call the care guide team at [phone] if you need to cancel or reschedule your appointment.   Patient verbalizes understanding of instructions and care plan provided today and agrees to view in MyChart. Active MyChart status confirmed with patient.

## 2021-05-02 NOTE — Telephone Encounter (Signed)
Medication Samples have been labeled and logged for the patient.  Drug name: [name]       Strength: 25 mg        Qty: 2 boxes  LOT: [id number]  Exp.Date: 12/2022  Dosing instructions: Take 1 tablet by mouth daily   The patient has been instructed regarding the correct time, dose, and frequency of taking this medication, including desired effects and most common side effects.   [name] 3:57 PM 05/02/2021

## 2021-05-02 NOTE — Progress Notes (Signed)
[facility])                                            [facility] Quality Pharmacy Team    05/02/2021  Beth Tyler 20-Feb-1946 [id number]  3 care coordination calls placed today in regards to Basaglar and [name] applications with [name], [name] application with [employer] and [name] application with [name].  Spoke to [name] at [name] who informs patient is APPROVED 05/02/21-04/07/22 for [name] and [name]. She informs medication will ship based on last fill dates in 2022 and going forward with delivery to the patient's home.  Spoke to [name] at [name] who informs patient is APPROVED 04/21/21-04/07/22 for [name]. She informs patient will need to call [employer] for refills as she did in 2022 giving [employer] approximately 2 weeks to process and ship out the medication.  Spoke to [name] at [name] who informs patient is APPROVED 05/01/21-04/07/22 for [name]. She informs patient will need to call [name] for refills as she did in 2022 giving [name] approximately 2 weeks to process and ship out the medication.  [name] P. [name], CPhT [facility]  [phone]

## 2021-05-02 NOTE — Chronic Care Management (AMB) (Signed)
Chronic Care Management CCM Pharmacy Note  05/02/2021 Name:  Beth Tyler MRN:  [id number] DOB:  [birth year]  Summary: - Patient approved for Basaglar, [name], [name], [name] assistance for 2023.   Recommendations/Changes made from today's visit: - Reviewed refill procedures. Preparing sample of [name]  Subjective: Beth Tyler is an 76 y.o. year old female who is a primary patient of Einar Pheasant, MD.  The CCM team was consulted for assistance with disease management and care coordination needs.    Engaged with patient by telephone for follow up visit for pharmacy case management and/or care coordination services.   Objective:  Medications Reviewed Today     Reviewed by [place], Tanda Rockers, CMA (Certified Medical Assistant) on 04/16/21 at 1633  Med List Status: <None>   Medication Order Taking? Sig Documenting Provider Last Dose Status Informant  acetaminophen (TYLENOL) 500 MG tablet [id number] Yes Take 500-1,000 mg by mouth every 6 (six) hours as needed for mild pain or moderate pain.  [provider] 04/16/2021 Active Self           Med Note Darnelle Maffucci, Zelena Bushong E   Wed Mar 14, 2021 12:58 PM) Taking 1000 mg Q6H  amLODipine (NORVASC) 10 MG tablet [id number] Yes Take 1 tablet by mouth once daily Einar Pheasant, MD 04/16/2021 Active   apixaban (ELIQUIS) 5 MG TABS tablet [id number] Yes Take 1 tablet (5 mg total) by mouth 2 (two) times daily. Einar Pheasant, MD 04/16/2021 Active   BD PEN NEEDLE NANO 2ND GEN 32G X 4 MM MISC [id number] Yes USE TO [facility] INSULIN THREE TIMES DAILY Einar Pheasant, MD 04/16/2021 Active   benzonatate (TESSALON) 100 MG capsule [id number] Yes Take 2 capsules (200 mg total) by mouth every 8 (eight) hours. Margarette Canada, NP 04/16/2021 Active   Continuous Blood Gluc Sensor (FREESTYLE LIBRE 2 SENSOR) MISC [id number] Yes USE TO CHECK BLOOD SUGARS AT LEAST 4 TIMES DAILY Einar Pheasant, MD 04/16/2021 Active   empagliflozin ([name]) 25 MG TABS tablet [id number]  Yes Take 1 tablet (25 mg total) by mouth daily before breakfast. Einar Pheasant, MD 04/16/2021 Active   ferrous sulfate 325 (65 FE) MG tablet [id number] Yes Take 325 mg by mouth daily with breakfast. [provider] 04/16/2021 Active   fluticasone (CUTIVATE) 0.05 % cream [id number] Yes Apply 1 application topically as needed. Use as directed. Einar Pheasant, MD 04/16/2021 Active   glucose blood [facility] VERIO) test strip [id number] Yes USE AS DIRECTED Einar Pheasant, MD 04/16/2021 Active   Insulin Glargine (BASAGLAR KWIKPEN) 100 UNIT/ML [id number] Yes Inject 22 Units into the skin every morning. Einar Pheasant, MD 04/16/2021 Active   insulin lispro ([name] KWIKPEN) 100 UNIT/ML KwikPen [id number] Yes Inject 8-14 Units into the skin 3 (three) times daily. Einar Pheasant, MD 04/16/2021 Active   ipratropium (ATROVENT) 0.06 % nasal spray [id number] Yes Place 2 sprays into both nostrils 4 (four) times daily. Margarette Canada, NP 04/16/2021 Active   magnesium oxide (MAG-OX) 400 (240 Mg) MG tablet [id number] Yes Take 1 tablet by mouth once daily Einar Pheasant, MD 04/16/2021 Active   metFORMIN (GLUCOPHAGE-XR) 500 MG 24 hr tablet [id number] Yes Take 2 tablets (1,000 mg total) by mouth 2 (two) times daily. Einar Pheasant, MD 04/16/2021 Active   metoprolol tartrate (LOPRESSOR) 25 MG tablet [id number] Yes Take 25 mg by mouth 2 (two) times daily. [provider] 04/16/2021 Active   Multiple Vitamin (MULTIVITAMIN) tablet [id number] Yes Take 1 tablet by mouth daily. [provider] 04/16/2021 Active   Pancrelipase, Lip-Prot-Amyl, ([name] PO) [id number]  Yes Take by mouth. Take 36,000-72,000 Units by mouth See admin instructions. Take 3 capsules by mouth three times daily with meals and take 2 capsule by mouth daily with snacks [provider] 04/16/2021 Active   pantoprazole (PROTONIX) 40 MG tablet [id number] Yes TAKE 1 TABLET BY MOUTH TWICE DAILY BEFORE MEAL(S) Einar Pheasant, MD 04/16/2021 Active   simvastatin  (ZOCOR) 10 MG tablet [id number] Yes TAKE 1 TABLET BY MOUTH AT BEDTIME Einar Pheasant, MD 04/16/2021 Active   triamcinolone (KENALOG) 0.1 % [id number] Yes APPLY  CREAM EXTERNALLY TWICE DAILY Einar Pheasant, MD 04/16/2021 Active   vitamin B-12 (CYANOCOBALAMIN) 100 MCG tablet [id number] Yes Take 100 mcg by mouth daily. [provider] 04/16/2021 Active             Pertinent Labs:   Lab Results  Component Value Date   HGBA1C 7.7 (H) 02/16/2021   Lab Results  Component Value Date   CHOL 113 02/16/2021   HDL 52.20 02/16/2021   LDLCALC 42 02/16/2021   LDLDIRECT 74.4 02/21/2012   TRIG 93.0 02/16/2021   CHOLHDL 2 02/16/2021   Lab Results  Component Value Date   CREATININE 0.93 02/16/2021   BUN 22 02/16/2021   NA 142 02/16/2021   K 4.3 02/16/2021   CL 105 02/16/2021   CO2 29 02/16/2021    SDOH:  (Social Determinants of Health) assessments and interventions performed:  SDOH Interventions    Flowsheet Row Most Recent Value  SDOH Interventions   Financial Strain Interventions Other (Comment)  [manufacturer assistance]       CCM Care Plan  Review of patient past medical history, allergies, medications, health status, including review of consultants reports, laboratory and other test data, was performed as part of comprehensive evaluation and provision of chronic care management services.   Care Plan : Medication Management  Updates made by De Hollingshead, RPH-CPP since 05/02/2021 12:00 AM     Problem: Diabetes, CKD, IBS      Long-Range Goal: Disease Progression Prevention   Recent Progress: On track  Priority: High  Note:   Current Barriers:  Unable to independently afford treatment regimen Unable to achieve control of diabetes   Pharmacist Clinical Goal(s):  Over the next 90 days, patient will verbalize ability to afford treatment regimen. Over the next 90 days, patient will achieve control of diabetes as evidenced by improvement in A1c through collaboration  with PharmD and provider.   Interventions: 1:1 collaboration with Einar Pheasant, MD regarding development and update of comprehensive plan of care as evidenced by provider attestation and co-signature Inter-disciplinary care team collaboration (see longitudinal plan of care) Comprehensive medication review performed; medication list updated in electronic medical record  Health Maintenance Yearly diabetic eye exam: up to date Yearly diabetic foot exam: up to date Urine microalbumin: up to date Yearly influenza vaccination: up to date   Td/Tdap vaccination: due - previously postponed Pneumonia vaccination: up to date COVID vaccinations: due - declined Shingrix vaccinations: due - recommended to pursue  Colonoscopy: up to date  Diabetes: Uncontrolled; current treatment: metformin XR 1000 mg BID, [name] 25 mg daily, Basaglar 22 units daily, [name] if >150, she does 10; 8-14 units up to three times daily with meals Avoiding GLP1 d/t concurrent IBS-D, pancreatic enzyme insufficiency and baseline GI concerns Approved for Basaglar, [name], and [name] through 04/07/22. Reviewed refill procedures today Patient reports she has been off [name] for about 2 weeks. Preparing sample today. Will defer glucose review until patient is back on [name] therapy.  Continue current insulin regimen and metformin at this time   Hypertension/Atrial Fibrillation: Appropriately managed; current rate control: metoprolol tartrate 25 mg BID; anticoagulant treatment: Eliquis 5 mg BID; additional antihypertensive regimen: amlodipine 10 mg daily; follows w/ cardiology Dr. Saralyn Pilar Approved for Eliquis assistance through 04/07/21. Has to meet out of pocket spend to reapply for 2023.  Previously recommended to continue current regimen at this time.   Hyperlipidemia: Controlled per last lipid panel; current treatment: simvastatin 10 mg daily  Previously recommended to continue current regimen at this  time  IBS w/ pancreatic enzyme insufficiency: Appropriately managed; Current regimen: [name] 3 capsules with meals and 2 with snacks. Follows w/ [facility] Approved [name] assistance for 2023 Per CPhT, medication shipment will arrive in 7-10 business days. Patient informed.   GERD: Managed appropriate; current regimen: pantoprazole 40 mg twice daily; follows w/ [facility] Previously recommended to continue current regimen at this time along with collaboration with GI.  Supplement: Ferrous sulfate 325 mg daily, Vitamin B12, multivitamin   Patient Goals/Self-Care Activities Over the next 90 days, patient will:  - take medications as prescribed check blood glucose at least three times daily using CGM, document, and provide at future appointments collaborate with provider on medication access solutions      Plan: Telephone follow up appointment with care management team member scheduled for:  4 weeks  Catie Darnelle Maffucci, PharmD, [place], [employer] Pharmacist [employer] at [employer] [phone]

## 2021-05-08 DIAGNOSIS — Z794 Long term (current) use of insulin: Secondary | ICD-10-CM | POA: Diagnosis not present

## 2021-05-08 DIAGNOSIS — E785 Hyperlipidemia, unspecified: Secondary | ICD-10-CM | POA: Diagnosis not present

## 2021-05-08 DIAGNOSIS — E1169 Type 2 diabetes mellitus with other specified complication: Secondary | ICD-10-CM

## 2021-05-08 DIAGNOSIS — I4891 Unspecified atrial fibrillation: Secondary | ICD-10-CM

## 2021-05-11 DIAGNOSIS — I480 Paroxysmal atrial fibrillation: Secondary | ICD-10-CM | POA: Diagnosis not present

## 2021-05-11 DIAGNOSIS — I10 Essential (primary) hypertension: Secondary | ICD-10-CM | POA: Diagnosis not present

## 2021-05-11 DIAGNOSIS — R0602 Shortness of breath: Secondary | ICD-10-CM | POA: Diagnosis not present

## 2021-05-11 DIAGNOSIS — E1169 Type 2 diabetes mellitus with other specified complication: Secondary | ICD-10-CM | POA: Diagnosis not present

## 2021-05-11 DIAGNOSIS — I890 Lymphedema, not elsewhere classified: Secondary | ICD-10-CM | POA: Diagnosis not present

## 2021-05-30 ENCOUNTER — Telehealth: Payer: Medicare HMO

## 2021-06-01 ENCOUNTER — Telehealth: Payer: Self-pay | Admitting: Pharmacist

## 2021-06-01 NOTE — Telephone Encounter (Signed)
Called patient to determine if [name] samples from 1/25 were still needed. Left voicemail and will send MyChart.

## 2021-06-08 ENCOUNTER — Telehealth: Payer: Medicare HMO

## 2021-06-12 ENCOUNTER — Ambulatory Visit: Payer: Self-pay | Admitting: Pharmacist

## 2021-06-12 NOTE — Chronic Care Management (AMB) (Signed)
?  Chronic Care Management  ? ?Note ? ?06/12/2021 ?Name: RAKIA FRAYNE MRN: [id number] DOB: [birth year] ? ? ? ?Closing pharmacy CCM case at this time. Will collaborate with Care Guide to outreach to schedule follow up with RN CM. Patient has clinic contact information for future questions or concerns.  ? ?Catie Feliz Beam, PharmD, [place], CPP ?Clinical Pharmacist ?Nature conservation officer at [employer] ?[phone] ? ?

## 2021-06-15 ENCOUNTER — Telehealth: Payer: Self-pay | Admitting: *Deleted

## 2021-06-15 DIAGNOSIS — I10 Essential (primary) hypertension: Secondary | ICD-10-CM

## 2021-06-15 DIAGNOSIS — Z794 Long term (current) use of insulin: Secondary | ICD-10-CM

## 2021-06-15 DIAGNOSIS — E7800 Pure hypercholesterolemia, unspecified: Secondary | ICD-10-CM

## 2021-06-15 NOTE — Telephone Encounter (Signed)
Please place future orders for lab appt.  

## 2021-06-16 NOTE — Telephone Encounter (Signed)
Orders placed for f/u labs.  

## 2021-06-19 ENCOUNTER — Other Ambulatory Visit: Payer: Self-pay

## 2021-06-19 ENCOUNTER — Other Ambulatory Visit (INDEPENDENT_AMBULATORY_CARE_PROVIDER_SITE_OTHER): Payer: Medicare HMO

## 2021-06-19 DIAGNOSIS — E1122 Type 2 diabetes mellitus with diabetic chronic kidney disease: Secondary | ICD-10-CM

## 2021-06-19 DIAGNOSIS — I10 Essential (primary) hypertension: Secondary | ICD-10-CM | POA: Diagnosis not present

## 2021-06-19 DIAGNOSIS — E7800 Pure hypercholesterolemia, unspecified: Secondary | ICD-10-CM

## 2021-06-19 DIAGNOSIS — N183 Chronic kidney disease, stage 3 unspecified: Secondary | ICD-10-CM

## 2021-06-19 DIAGNOSIS — Z794 Long term (current) use of insulin: Secondary | ICD-10-CM

## 2021-06-19 LAB — CBC WITH DIFFERENTIAL/PLATELET
Basophils Absolute: 0.1 10*3/uL (ref 0.0–0.1)
Basophils Relative: 0.8 % (ref 0.0–3.0)
Eosinophils Absolute: 0.4 10*3/uL (ref 0.0–0.7)
Eosinophils Relative: 4.2 % (ref 0.0–5.0)
HCT: 42 % (ref 36.0–46.0)
Hemoglobin: 13.9 g/dL (ref 12.0–15.0)
Lymphocytes Relative: 28 % (ref 12.0–46.0)
Lymphs Abs: 2.4 10*3/uL (ref 0.7–4.0)
MCHC: 33 g/dL (ref 30.0–36.0)
MCV: 90.5 fl (ref 78.0–100.0)
Monocytes Absolute: 0.7 10*3/uL (ref 0.1–1.0)
Monocytes Relative: 8.7 % (ref 3.0–12.0)
Neutro Abs: 4.9 10*3/uL (ref 1.4–7.7)
Neutrophils Relative %: 58.3 % (ref 43.0–77.0)
Platelets: 260 10*3/uL (ref 150.0–400.0)
RBC: 4.64 Mil/uL (ref 3.87–5.11)
RDW: 13.5 % (ref 11.5–15.5)
WBC: 8.4 10*3/uL (ref 4.0–10.5)

## 2021-06-19 LAB — HEPATIC FUNCTION PANEL
ALT: 20 U/L (ref 0–35)
AST: 18 U/L (ref 0–37)
Albumin: 4.6 g/dL (ref 3.5–5.2)
Alkaline Phosphatase: 86 U/L (ref 39–117)
Bilirubin, Direct: 0.2 mg/dL (ref 0.0–0.3)
Total Bilirubin: 0.9 mg/dL (ref 0.2–1.2)
Total Protein: 6.9 g/dL (ref 6.0–8.3)

## 2021-06-19 LAB — HEMOGLOBIN A1C: Hgb A1c MFr Bld: 7.6 % — ABNORMAL HIGH (ref 4.6–6.5)

## 2021-06-19 LAB — BASIC METABOLIC PANEL
BUN: 28 mg/dL — ABNORMAL HIGH (ref 6–23)
CO2: 30 mEq/L (ref 19–32)
Calcium: 9.8 mg/dL (ref 8.4–10.5)
Chloride: 102 mEq/L (ref 96–112)
Creatinine, Ser: 1.23 mg/dL — ABNORMAL HIGH (ref 0.40–1.20)
GFR: 42.62 mL/min — ABNORMAL LOW (ref >60.00)
Glucose, Bld: 93 mg/dL (ref 70–99)
Potassium: 4.1 mEq/L (ref 3.5–5.1)
Sodium: 140 mEq/L (ref 135–145)

## 2021-06-19 LAB — LIPID PANEL
Cholesterol: 109 mg/dL (ref 0–200)
HDL: 57.5 mg/dL (ref >39.00)
LDL Cholesterol: 25 mg/dL (ref 0–99)
NonHDL: 51.97
Total CHOL/HDL Ratio: 2
Triglycerides: 133 mg/dL (ref 0.0–149.0)
VLDL: 26.6 mg/dL (ref 0.0–40.0)

## 2021-06-19 LAB — TSH: TSH: 1.88 u[IU]/mL (ref 0.35–5.50)

## 2021-06-21 ENCOUNTER — Other Ambulatory Visit: Payer: Self-pay

## 2021-06-21 ENCOUNTER — Ambulatory Visit (INDEPENDENT_AMBULATORY_CARE_PROVIDER_SITE_OTHER): Payer: Medicare HMO | Admitting: Internal Medicine

## 2021-06-21 ENCOUNTER — Encounter: Payer: Self-pay | Admitting: Internal Medicine

## 2021-06-21 VITALS — BP 128/72 | HR 66 | Temp 97.9°F | Resp 16 | Ht 62.0 in | Wt 198.0 lb

## 2021-06-21 DIAGNOSIS — I700 Atherosclerosis of aorta: Secondary | ICD-10-CM | POA: Diagnosis not present

## 2021-06-21 DIAGNOSIS — R0989 Other specified symptoms and signs involving the circulatory and respiratory systems: Secondary | ICD-10-CM | POA: Diagnosis not present

## 2021-06-21 DIAGNOSIS — R944 Abnormal results of kidney function studies: Secondary | ICD-10-CM | POA: Insufficient documentation

## 2021-06-21 DIAGNOSIS — E1122 Type 2 diabetes mellitus with diabetic chronic kidney disease: Secondary | ICD-10-CM | POA: Diagnosis not present

## 2021-06-21 DIAGNOSIS — Z794 Long term (current) use of insulin: Secondary | ICD-10-CM

## 2021-06-21 DIAGNOSIS — I480 Paroxysmal atrial fibrillation: Secondary | ICD-10-CM

## 2021-06-21 DIAGNOSIS — E7800 Pure hypercholesterolemia, unspecified: Secondary | ICD-10-CM

## 2021-06-21 DIAGNOSIS — Z1231 Encounter for screening mammogram for malignant neoplasm of breast: Secondary | ICD-10-CM

## 2021-06-21 DIAGNOSIS — N183 Chronic kidney disease, stage 3 unspecified: Secondary | ICD-10-CM | POA: Diagnosis not present

## 2021-06-21 DIAGNOSIS — I10 Essential (primary) hypertension: Secondary | ICD-10-CM

## 2021-06-21 DIAGNOSIS — D6859 Other primary thrombophilia: Secondary | ICD-10-CM | POA: Diagnosis not present

## 2021-06-21 MED ORDER — MUPIROCIN 2 % EX OINT: 1.0000 "application " | TOPICAL_OINTMENT | Freq: Two times a day (BID) | CUTANEOUS | 0 refills | Status: AC

## 2021-06-21 NOTE — Progress Notes (Signed)
Patient ID: Beth Tyler, female   DOB: [birth year], 76 y.o.   MRN: [id number] ? ? ?Subjective:  ? ? Patient ID: Beth Tyler, female    DOB: 1945-05-01, 76 y.o.   MRN: [id number] ? ?This visit occurred during the SARS-CoV-2 public health emergency.  Safety protocols were in place, including screening questions prior to the visit, additional usage of staff PPE, and extensive cleaning of exam room while observing appropriate contact time as indicated for disinfecting solutions.  ? ?Patient here for a scheduled follow up.  ? ?Chief Complaint  ?Patient presents with  ? Diabetes  ? Hyperlipidemia  ? .  ? ?HPI ?Follow up: a1c remains elevated 7.6.  on basaglar and humalog.  Had been off jardiance for several weeks.  Back on now.  Watching her diet.  Discussed diet and exercise.  No chest pain or sob reported.  No increased chest congestion or cough.  GI symptoms stable.   ? ? ?Past Medical History:  ?Diagnosis Date  ? Anemia   ? Arthritis   ? B12 deficiency   ? Carpal tunnel syndrome, bilateral   ? COVID-19 03/18/2019  ? Diabetes mellitus ([place]) 02/17/2012  ? Diabetes mellitus, type II ([place])   ? Dyspnea   ? Gastritis   ? GERD (gastroesophageal reflux disease)   ? Hypercholesterolemia   ? Hypertension   ? IBS (irritable bowel syndrome)   ? Neuropathy   ? legs  ? Pernicious anemia   ? Vertigo   ? no episodes fo 9 or 10 yrs  ? ?Past Surgical History:  ?Procedure Laterality Date  ? ANTERIOR VITRECTOMY Right 12/01/2017  ? Procedure: ANTERIOR VITRECTOMY;  Surgeon: Eulogio Bear, MD;  Location: [place];  Service: Ophthalmology;  Laterality: Right;  ? BREAST BIOPSY Right 1980  ? neg  ? BREAST SURGERY  1980  ? benign cyst removal  ? CATARACT EXTRACTION W/PHACO Left 10/13/2017  ? Procedure: CATARACT EXTRACTION PHACO AND INTRAOCULAR LENS PLACEMENT ([place])  LEFT  DIABETES;  Surgeon: Eulogio Bear, MD;  Location: [place];  Service: Ophthalmology;  Laterality: Left;  Diabetic - insulin and oral meds  ?  CATARACT EXTRACTION W/PHACO Right 12/01/2017  ? Procedure: CATARACT EXTRACTION PHACO AND INTRAOCULAR LENS PLACEMENT ([place])  RIGHT DIABETIC;  Surgeon: Eulogio Bear, MD;  Location: [place];  Service: Ophthalmology;  Laterality: Right;  Diabetic - insulin and oral  ? CHOLECYSTECTOMY  1983  ? ?Family History  ?Problem Relation Age of Onset  ? Diabetes Father   ? Kidney disease Father   ? Heart disease Father   ? Alcohol abuse Mother   ? Dementia Mother   ? Diabetes Mother   ? Diabetes Other   ? Hemophilia Brother   ? Uterine cancer Sister   ? Diabetes Sister   ? COPD Sister   ? Breast cancer Maternal Aunt 51  ? Breast cancer Daughter 49  ? Stroke Daughter   ? Diabetes Son   ? ?Social History  ? ?Socioeconomic History  ? Marital status: Widowed  ?  Spouse name: Not on file  ? Number of children: 4  ? Years of education: Not on file  ? Highest education level: Not on file  ?Occupational History  ? Not on file  ?Tobacco Use  ? Smoking status: Never  ? Smokeless tobacco: Never  ?Vaping Use  ? Vaping Use: Never used  ?Substance and Sexual Activity  ? Alcohol use: No  ?  Alcohol/week: 0.0 standard drinks  ?  Drug use: No  ? Sexual activity: Not Currently  ?Other Topics Concern  ? Not on file  ?Social History Narrative  ? Widowed. Has four children. Manager of an apartment building. No tobacco, alcohol or other drug use  ? ?Social Determinants of Health  ? ?Financial Resource Strain: Medium Risk  ? Difficulty of Paying Living Expenses: Somewhat hard  ?Food Insecurity: Not on file  ?Transportation Needs: Not on file  ?Physical Activity: Not on file  ?Stress: Not on file  ?Social Connections: Not on file  ? ? ? ?Review of Systems  ?Constitutional:  Negative for appetite change and unexpected weight change.  ?HENT:  Negative for congestion and sinus pressure.   ?Respiratory:  Negative for cough, chest tightness and shortness of breath.   ?Cardiovascular:  Negative for chest pain, palpitations and leg swelling.   ?Gastrointestinal:  Negative for abdominal pain, nausea and vomiting.  ?Genitourinary:  Negative for difficulty urinating and dysuria.  ?Musculoskeletal:  Negative for joint swelling and myalgias.  ?Skin:  Negative for color change and rash.  ?Neurological:  Negative for dizziness, light-headedness and headaches.  ?Psychiatric/Behavioral:  Negative for agitation and dysphoric mood.   ? ?   ?Objective:  ?  ? ?BP 128/72   Pulse 66   Temp 97.9 ?F (36.6 ?C)   Resp 16   Ht 5\' 2"  (1.575 m)   Wt 198 lb (89.8 kg)   SpO2 97%   BMI 36.21 kg/m?  ?Wt Readings from Last 3 Encounters:  ?06/21/21 198 lb (89.8 kg)  ?04/16/21 197 lb 8.5 oz (89.6 kg)  ?04/12/21 197 lb 8.5 oz (89.6 kg)  ? ? ?Physical Exam ?Vitals reviewed.  ?Constitutional:   ?   General: She is not in acute distress. ?   Appearance: Normal appearance.  ?HENT:  ?   Head: Normocephalic and atraumatic.  ?   Right Ear: External ear normal.  ?   Left Ear: External ear normal.  ?Eyes:  ?   General: No scleral icterus.    ?   Right eye: No discharge.     ?   Left eye: No discharge.  ?   Conjunctiva/sclera: Conjunctivae normal.  ?Neck:  ?   Thyroid: No thyromegaly.  ?Cardiovascular:  ?   Rate and Rhythm: Normal rate and regular rhythm.  ?Pulmonary:  ?   Effort: No respiratory distress.  ?   Breath sounds: Normal breath sounds. No wheezing.  ?Abdominal:  ?   General: Bowel sounds are normal.  ?   Palpations: Abdomen is soft.  ?   Tenderness: There is no abdominal tenderness.  ?Musculoskeletal:     ?   General: No tenderness.  ?   Cervical back: Neck supple. No tenderness.  ?   Comments: No increased swelling.   ?Lymphadenopathy:  ?   Cervical: No cervical adenopathy.  ?Skin: ?   Findings: No erythema or rash.  ?Neurological:  ?   Mental Status: She is alert.  ?Psychiatric:     ?   Mood and Affect: Mood normal.     ?   Behavior: Behavior normal.  ? ? ? ?Outpatient Encounter Medications as of 06/21/2021  ?Medication Sig  ? mupirocin ointment (BACTROBAN) 2 % Apply 1  application. topically 2 (two) times daily.  ? acetaminophen (TYLENOL) 500 MG tablet Take 500-1,000 mg by mouth every 6 (six) hours as needed for mild pain or moderate pain.   ? albuterol (VENTOLIN HFA) 108 (90 Base) MCG/ACT inhaler Inhale 2 puffs into the  lungs every 4 (four) hours as needed for wheezing or shortness of breath.  ? amLODipine (NORVASC) 10 MG tablet Take 1 tablet by mouth once daily  ? apixaban (ELIQUIS) 5 MG TABS tablet Take 1 tablet (5 mg total) by mouth 2 (two) times daily.  ? BD PEN NEEDLE NANO 2ND GEN 32G X 4 MM MISC USE TO INJECT INSULIN THREE TIMES DAILY  ? benzonatate (TESSALON) 100 MG capsule Take 2 capsules (200 mg total) by mouth every 8 (eight) hours.  ? Continuous Blood Gluc Sensor (FREESTYLE LIBRE 2 SENSOR) MISC USE TO CHECK BLOOD SUGARS AT LEAST 4 TIMES DAILY  ? empagliflozin ([name]) 25 MG TABS tablet Take 1 tablet (25 mg total) by mouth daily before breakfast.  ? ferrous sulfate 325 (65 FE) MG tablet Take 325 mg by mouth daily with breakfast.  ? fluticasone (CUTIVATE) 0.05 % cream Apply 1 application topically as needed. Use as directed.  ? glucose blood (ONETOUCH VERIO) test strip USE AS DIRECTED  ? Insulin Glargine (BASAGLAR KWIKPEN) 100 UNIT/ML Inject 22 Units into the skin every morning.  ? insulin lispro ([name] KWIKPEN) 100 UNIT/ML KwikPen Inject 8-14 Units into the skin 3 (three) times daily.  ? ipratropium (ATROVENT) 0.06 % nasal spray Place 2 sprays into both nostrils 4 (four) times daily.  ? magnesium oxide (MAG-OX) 400 (240 Mg) MG tablet Take 1 tablet by mouth once daily  ? metFORMIN (GLUCOPHAGE-XR) 500 MG 24 hr tablet Take 2 tablets (1,000 mg total) by mouth 2 (two) times daily.  ? metoprolol tartrate (LOPRESSOR) 25 MG tablet Take 25 mg by mouth 2 (two) times daily.  ? Multiple Vitamin (MULTIVITAMIN) tablet Take 1 tablet by mouth daily.  ? Pancrelipase, Lip-Prot-Amyl, ([name] PO) Take by mouth. Take 36,000-72,000 Units by mouth See admin instructions. Take 3 capsules  by mouth three times daily with meals and take 2 capsule by mouth daily with snacks  ? pantoprazole (PROTONIX) 40 MG tablet TAKE 1 TABLET BY MOUTH TWICE DAILY BEFORE MEAL(S)  ? simvastatin (ZOCOR) 10 MG tablet TAKE 1 T

## 2021-06-21 NOTE — Patient Instructions (Signed)
Allegra (fexofenidine) - 60mg  - one per day.  ?

## 2021-06-24 ENCOUNTER — Encounter: Payer: Self-pay | Admitting: Internal Medicine

## 2021-06-24 DIAGNOSIS — R0989 Other specified symptoms and signs involving the circulatory and respiratory systems: Secondary | ICD-10-CM | POA: Insufficient documentation

## 2021-06-24 NOTE — Assessment & Plan Note (Signed)
Continue metoprolol and amlodipine.  Blood pressure doing well.  Follow pressures.  No changes made.  Follow metabolic panel.  

## 2021-06-24 NOTE — Assessment & Plan Note (Signed)
Recent lab - decreased gfr.  Stay hydrated.  Recheck met b on the next 1-2 weeks.   ?

## 2021-06-24 NOTE — Assessment & Plan Note (Addendum)
Sugars as outlined.  Discussed low carb diet and exercise.  Check met b and a1c.  a1c 7.6.  Continue insulin as she is doing.  Back on jardiance now.  Hold on making changes.  Low carb diet and exercise.  Follow met b and a1c.  ?

## 2021-06-24 NOTE — Assessment & Plan Note (Signed)
afib on eliquis.  

## 2021-06-24 NOTE — Assessment & Plan Note (Signed)
Continue simvastatin. 

## 2021-06-24 NOTE — Assessment & Plan Note (Signed)
Trial of allegra.  Follow.  No acid reflux.   ?

## 2021-06-24 NOTE — Assessment & Plan Note (Signed)
Continue simvastatin.  Low cholesterol diet and exercise.  Follow lipid panel and liver function tests.   

## 2021-06-24 NOTE — Assessment & Plan Note (Signed)
Continue metoprolol and eliquis.  Follow.  ?

## 2021-06-28 ENCOUNTER — Other Ambulatory Visit: Payer: Self-pay

## 2021-06-28 ENCOUNTER — Other Ambulatory Visit (INDEPENDENT_AMBULATORY_CARE_PROVIDER_SITE_OTHER): Payer: Medicare HMO

## 2021-06-28 DIAGNOSIS — R944 Abnormal results of kidney function studies: Secondary | ICD-10-CM

## 2021-06-28 LAB — BASIC METABOLIC PANEL
BUN: 27 mg/dL — ABNORMAL HIGH (ref 6–23)
CO2: 27 mEq/L (ref 19–32)
Calcium: 9.4 mg/dL (ref 8.4–10.5)
Chloride: 104 mEq/L (ref 96–112)
Creatinine, Ser: 1.12 mg/dL (ref 0.40–1.20)
GFR: 47.68 mL/min — ABNORMAL LOW (ref >60.00)
Glucose, Bld: 191 mg/dL — ABNORMAL HIGH (ref 70–99)
Potassium: 4.5 mEq/L (ref 3.5–5.1)
Sodium: 140 mEq/L (ref 135–145)

## 2021-06-29 ENCOUNTER — Other Ambulatory Visit: Payer: Self-pay | Admitting: Internal Medicine

## 2021-06-29 DIAGNOSIS — R944 Abnormal results of kidney function studies: Secondary | ICD-10-CM

## 2021-06-29 NOTE — Progress Notes (Signed)
Order placed for renal ultrasound.  

## 2021-07-02 ENCOUNTER — Other Ambulatory Visit: Payer: Self-pay | Admitting: Internal Medicine

## 2021-07-02 ENCOUNTER — Telehealth: Payer: Self-pay | Admitting: Internal Medicine

## 2021-07-02 NOTE — Telephone Encounter (Signed)
I spoke with pt and she ask med to send you a message to call her. Please and Thank you! ? ?Call pt @ [phone]. ?

## 2021-07-03 NOTE — Telephone Encounter (Signed)
Please call and see what she needs 

## 2021-07-03 NOTE — Telephone Encounter (Signed)
Beth Tyler, can you please outreach the patient to help her with her needs, as I no longer work in this role? ? ?

## 2021-07-04 ENCOUNTER — Emergency Department
Admission: EM | Admit: 2021-07-04 | Discharge: 2021-07-04 | Disposition: A | Discharge status: Home / Self Care | Payer: Medicare HMO | Attending: Emergency Medicine | Admitting: Emergency Medicine

## 2021-07-04 ENCOUNTER — Emergency Department: Payer: Medicare HMO

## 2021-07-04 ENCOUNTER — Other Ambulatory Visit: Payer: Self-pay

## 2021-07-04 ENCOUNTER — Encounter: Payer: Self-pay | Admitting: Intensive Care

## 2021-07-04 DIAGNOSIS — J189 Pneumonia, unspecified organism: Secondary | ICD-10-CM | POA: Diagnosis not present

## 2021-07-04 DIAGNOSIS — D72829 Elevated white blood cell count, unspecified: Secondary | ICD-10-CM | POA: Diagnosis not present

## 2021-07-04 DIAGNOSIS — R072 Precordial pain: Secondary | ICD-10-CM | POA: Diagnosis present

## 2021-07-04 DIAGNOSIS — Z7901 Long term (current) use of anticoagulants: Secondary | ICD-10-CM | POA: Diagnosis not present

## 2021-07-04 DIAGNOSIS — R6510 Systemic inflammatory response syndrome (SIRS) of non-infectious origin without acute organ dysfunction: Secondary | ICD-10-CM | POA: Diagnosis not present

## 2021-07-04 DIAGNOSIS — J181 Lobar pneumonia, unspecified organism: Secondary | ICD-10-CM | POA: Insufficient documentation

## 2021-07-04 DIAGNOSIS — R0789 Other chest pain: Secondary | ICD-10-CM | POA: Diagnosis not present

## 2021-07-04 DIAGNOSIS — R1084 Generalized abdominal pain: Secondary | ICD-10-CM | POA: Diagnosis not present

## 2021-07-04 DIAGNOSIS — D649 Anemia, unspecified: Secondary | ICD-10-CM | POA: Insufficient documentation

## 2021-07-04 DIAGNOSIS — Z8616 Personal history of COVID-19: Secondary | ICD-10-CM | POA: Diagnosis not present

## 2021-07-04 DIAGNOSIS — Z20822 Contact with and (suspected) exposure to covid-19: Secondary | ICD-10-CM | POA: Insufficient documentation

## 2021-07-04 DIAGNOSIS — I10 Essential (primary) hypertension: Secondary | ICD-10-CM | POA: Insufficient documentation

## 2021-07-04 DIAGNOSIS — R079 Chest pain, unspecified: Secondary | ICD-10-CM | POA: Diagnosis not present

## 2021-07-04 DIAGNOSIS — E119 Type 2 diabetes mellitus without complications: Secondary | ICD-10-CM | POA: Insufficient documentation

## 2021-07-04 LAB — BASIC METABOLIC PANEL
Anion gap: 9 (ref 5–15)
BUN: 21 mg/dL (ref 8–23)
CO2: 26 mmol/L (ref 22–32)
Calcium: 9 mg/dL (ref 8.9–10.3)
Chloride: 103 mmol/L (ref 98–111)
Creatinine, Ser: 0.9 mg/dL (ref 0.44–1.00)
GFR, Estimated: >60 mL/min (ref >60)
Glucose, Bld: 149 mg/dL — ABNORMAL HIGH (ref 70–99)
Potassium: 3.9 mmol/L (ref 3.5–5.1)
Sodium: 138 mmol/L (ref 135–145)

## 2021-07-04 LAB — CBC
HCT: 43.2 % (ref 36.0–46.0)
Hemoglobin: 13.8 g/dL (ref 12.0–15.0)
MCH: 29.2 pg (ref 26.0–34.0)
MCHC: 31.9 g/dL (ref 30.0–36.0)
MCV: 91.3 fL (ref 80.0–100.0)
Platelets: 243 10*3/uL (ref 150–400)
RBC: 4.73 MIL/uL (ref 3.87–5.11)
RDW: 12.8 % (ref 11.5–15.5)
WBC: 18 10*3/uL — ABNORMAL HIGH (ref 4.0–10.5)
nRBC: 0 % (ref 0.0–0.2)

## 2021-07-04 LAB — URINALYSIS, COMPLETE (UACMP) WITH MICROSCOPIC
Bacteria, UA: NONE SEEN
Bilirubin Urine: NEGATIVE
Glucose, UA: >=500 mg/dL — AB
Ketones, ur: NEGATIVE mg/dL
Leukocytes,Ua: NEGATIVE
Nitrite: NEGATIVE
Protein, ur: 100 mg/dL — AB
Specific Gravity, Urine: 1.009 (ref 1.005–1.030)
pH: 7 (ref 5.0–8.0)

## 2021-07-04 LAB — PROTIME-INR
INR: 1.2 (ref 0.8–1.2)
Prothrombin Time: 14.9 seconds (ref 11.4–15.2)

## 2021-07-04 LAB — RESP PANEL BY RT-PCR (FLU A&B, COVID) ARPGX2
Influenza A by PCR: NEGATIVE
Influenza B by PCR: NEGATIVE
SARS Coronavirus 2 by RT PCR: NEGATIVE

## 2021-07-04 LAB — PROCALCITONIN: Procalcitonin: <0.1 ng/mL

## 2021-07-04 LAB — TROPONIN I (HIGH SENSITIVITY)
Troponin I (High Sensitivity): 16 ng/L (ref <18)
Troponin I (High Sensitivity): 18 ng/L — ABNORMAL HIGH (ref <18)

## 2021-07-04 LAB — LACTIC ACID, PLASMA: Lactic Acid, Venous: 1 mmol/L (ref 0.5–1.9)

## 2021-07-04 MED ORDER — ONDANSETRON 4 MG PO TBDP: 4.0000 mg | ORAL_TABLET | Freq: Three times a day (TID) | ORAL | 0 refills | Status: DC | PRN

## 2021-07-04 MED ORDER — LACTATED RINGERS IV BOLUS
1000.0000 mL | Freq: Once | INTRAVENOUS | Status: AC
  Administered 2021-07-04: 1000 mL via INTRAVENOUS

## 2021-07-04 MED ORDER — ONDANSETRON 4 MG PO TBDP: 4.0000 mg | ORAL_TABLET | Freq: Three times a day (TID) | ORAL | 0 refills | Status: AC | PRN

## 2021-07-04 MED ORDER — AZITHROMYCIN 250 MG PO TABS: ORAL_TABLET | ORAL | 0 refills | Status: AC

## 2021-07-04 MED ORDER — ACETAMINOPHEN 325 MG PO TABS
650.0000 mg | ORAL_TABLET | Freq: Once | ORAL | Status: AC | PRN
  Administered 2021-07-04: 650 mg via ORAL
  Filled 2021-07-04: qty 2

## 2021-07-04 MED ORDER — SODIUM CHLORIDE 0.9 % IV SOLN
1.0000 g | Freq: Once | INTRAVENOUS | Status: AC
  Administered 2021-07-04: 1 g via INTRAVENOUS
  Filled 2021-07-04: qty 10

## 2021-07-04 MED ORDER — SODIUM CHLORIDE 0.9 % IV SOLN
500.0000 mg | Freq: Once | INTRAVENOUS | Status: AC
  Administered 2021-07-04: 500 mg via INTRAVENOUS
  Filled 2021-07-04: qty 5

## 2021-07-04 MED ORDER — ACETAMINOPHEN 325 MG PO TABS
325.0000 mg | ORAL_TABLET | Freq: Once | ORAL | Status: DC
  Filled 2021-07-04: qty 1

## 2021-07-04 MED ORDER — AMOXICILLIN-POT CLAVULANATE 875-125 MG PO TABS: 1.0000 | ORAL_TABLET | Freq: Two times a day (BID) | ORAL | 0 refills | Status: AC

## 2021-07-04 NOTE — ED Provider Notes (Signed)
? ?[facility] ?Provider Note ? ? ? Event Date/Time  ? First MD Initiated Contact with Patient 07/04/21 1317   ?  (approximate) ? ? ?History  ? ?Chest Pain ? ? ?HPI ? ?Beth Tyler is a 76 y.o. female with a past medical history of DM, HTN, HDL, GERD, arthritis, anemia, peripheral neuropathy, IBS and previous COVID infection as well as A-fib on Eliquis without any recent missed doses who presents via EMS for evaluation of some substernal chest discomfort associate with nausea body aches and fatigue.  He states most of her symptoms all started this morning and she was only feeling a little fatigued yesterday.  No vomiting, diarrhea, abdominal pain, back pain, headache, earache, sore throat, cough, shortness of breath or any other clear associated sick symptoms.  She received Zofran and 324 mg of ASA by EMS.  She states she is no longer nauseous and has no chest pain.  No other acute concerns at this time. ? ?  ?Past Medical History:  ?Diagnosis Date  ? Anemia   ? Arthritis   ? B12 deficiency   ? Carpal tunnel syndrome, bilateral   ? COVID-19 03/18/2019  ? Diabetes mellitus ([place]) 02/17/2012  ? Diabetes mellitus, type II ([place])   ? Dyspnea   ? Gastritis   ? GERD (gastroesophageal reflux disease)   ? Hypercholesterolemia   ? Hypertension   ? IBS (irritable bowel syndrome)   ? Neuropathy   ? legs  ? Pernicious anemia   ? Vertigo   ? no episodes fo 9 or 10 yrs  ? ? ? ?Physical Exam  ?Triage Vital Signs: ?ED Triage Vitals  ?Enc Vitals Group  ?   BP 07/04/21 1122 (!) 144/60  ?   Pulse Rate 07/04/21 1122 90  ?   Resp 07/04/21 1122 18  ?   Temp 07/04/21 1122 (!) 103.1 ?F (39.5 ?C)  ?   Temp Source 07/04/21 1122 Oral  ?   SpO2 07/04/21 1122 93 %  ?   Weight 07/04/21 1113 198 lb (89.8 kg)  ?   Height 07/04/21 1113 5' 2"  (1.575 m)  ?   Head Circumference --   ?   Peak Flow --   ?   Pain Score 07/04/21 1113 1  ?   Pain Loc --   ?   Pain Edu? --   ?   Excl. in [place]? --   ? ? ?Most recent vital  signs: ?Vitals:  ? 07/04/21 1400 07/04/21 1407  ?BP: (!) 111/42   ?Pulse: 74   ?Resp: 16   ?Temp:  98.8 ?F (37.1 ?C)  ?SpO2: 92%   ? ? ?General: Awake, no distress.  ?CV:  Good peripheral perfusion.  2+ radial pulse. ?Resp:  Normal effort.  Clear bilaterally. ?Abd:  No distention.  Soft. ?Other:  Slightly dry mucous membranes. ? ? ?ED Results / Procedures / Treatments  ?Labs ?(all labs ordered are listed, but only abnormal results are displayed) ?Labs Reviewed  ?BASIC METABOLIC PANEL - Abnormal; Notable for the following components:  ?    Result Value  ? Glucose, Bld 149 (*)   ? All other components within normal limits  ?CBC - Abnormal; Notable for the following components:  ? WBC 18.0 (*)   ? All other components within normal limits  ?URINALYSIS, COMPLETE (UACMP) WITH MICROSCOPIC - Abnormal; Notable for the following components:  ? Color, Urine STRAW (*)   ? APPearance CLEAR (*)   ? Glucose, UA >=  500 (*)   ? Hgb urine dipstick SMALL (*)   ? Protein, ur 100 (*)   ? All other components within normal limits  ?TROPONIN I (HIGH SENSITIVITY) - Abnormal; Notable for the following components:  ? Troponin I (High Sensitivity) 18 (*)   ? All other components within normal limits  ?RESP PANEL BY RT-PCR (FLU A&B, COVID) [id number]  ?CULTURE, BLOOD (ROUTINE X 2)  ?CULTURE, BLOOD (ROUTINE X 2)  ?PROTIME-INR  ?LACTIC ACID, PLASMA  ?PROCALCITONIN  ?LACTIC ACID, PLASMA  ?TROPONIN I (HIGH SENSITIVITY)  ? ? ? ?EKG ? ?EKG is remarkable for sinus rhythm with a ventricular rate of 89, normal axis, unremarkable intervals without evidence of acute ischemia or significant arrhythmia. ? ? ?RADIOLOGY ? ?Chest x-ray my interpretation shows a right-sided upper lobe consolidation.  There is no pneumothorax, effusion, overt edema or other clear acute thoracic process. ? ? ?PROCEDURES: ? ?Critical Care performed: No ? ?.1-3 Lead EKG Interpretation ?Performed by: Lucrezia Starch, MD ?Authorized by: Lucrezia Starch, MD  ? ?  Interpretation: normal    ?  ECG rate assessment: normal   ?  Rhythm: sinus rhythm   ?  Ectopy: none   ?  Conduction: normal   ? ?The patient is on the cardiac monitor to evaluate for evidence of arrhythmia and/or significant heart rate changes. ? ? ?MEDICATIONS ORDERED IN ED: ?Medications  ?azithromycin (ZITHROMAX) 500 mg in sodium chloride 0.9 % 250 mL IVPB (500 mg Intravenous New Bag/Given 07/04/21 1410)  ?acetaminophen (TYLENOL) tablet 325 mg (325 mg Oral Patient Refused/Not Given 07/04/21 1412)  ?acetaminophen (TYLENOL) tablet 650 mg (650 mg Oral Given 07/04/21 1133)  ?cefTRIAXone (ROCEPHIN) 1 g in sodium chloride 0.9 % 100 mL IVPB (1 g Intravenous New Bag/Given 07/04/21 1406)  ?lactated ringers bolus 1,000 mL (1,000 mLs Intravenous New Bag/Given 07/04/21 1402)  ? ? ? ?IMPRESSION / MDM / ASSESSMENT AND PLAN / ED COURSE  ?I reviewed the triage vital signs and the nursing notes. ?             ?               ? ?Differential diagnosis includes, but is not limited to ACS, bronchitis, pneumonia, symptomatic pleural effusion, myocarditis, anemia, electrolyte derangements with a lower suspicion at this time based on history and exam for PE given compliance with Eliquis or dissection. ? ?EKG is remarkable for sinus rhythm with a ventricular rate of 89, normal axis, unremarkable intervals without evidence of acute ischemia or significant arrhythmia.  Troponin is 16 and overall of low suspicion for ACS at this time. ? ?Chest x-ray my interpretation shows a right-sided upper lobe consolidation.  There is no pneumothorax, effusion, overt edema or other clear acute thoracic process. ? ?CBC is remarkable for WBC count of 18 without evidence of acute anemia and normal platelets.  BMP shows no significant electrolyte or metabolic derangements.  INR is within normal limits.  COVID influenza PCR is negative.  UA has some protein small hemoglobin but otherwise does not appear infectious. ? ?Suspect symptoms are likely from acute right-sided  community-acquired pneumonia. ? ?While patient initially met SIRS criteria with fever and leukocytosis she states she has complete resolution of her symptoms and overall I do not think she is septic at this time.  Abdomen abundance of caution I did obtain blood cultures.  She was ambulated in place for 1 minute and had no shortness of breath or chest pain was able to maintain SPO2  greater than 95%.  She had 1 low blood pressure recorded but I think was erroneous is on recheck with repositioning her blood pressure cuff she is normotensive.  She thinks she feels comfortable going home.  I think this is reasonable given I have low suspicion for sepsis I do not think she is evidence of any respiratory failure.  I considered observation admission although given patient that she was comfortable going home with otherwise reassuring ambulatory SPO2, stable vitals and low suspicion for sepsis I think discharge is reasonable.  Will write Rx for Zofran antibiotics.  Advise close outpatient PCP follow-up.  Discharged in stable condition.  Strict return precautions advised and discussed. ? ? ? ?  ? ? ?FINAL CLINICAL IMPRESSION(S) / ED DIAGNOSES  ? ?Final diagnoses:  ?Community acquired pneumonia of right upper lobe of lung  ?SIRS (systemic inflammatory response syndrome) (HCC)  ? ? ? ?Rx / DC Orders  ? ?ED Discharge Orders   ? ?      Ordered  ?  ondansetron (ZOFRAN-ODT) 4 MG disintegrating tablet  Every 8 hours PRN       ? 07/04/21 1457  ?  amoxicillin-clavulanate (AUGMENTIN) 875-125 MG tablet  2 times daily       ? 07/04/21 1457  ?  azithromycin (ZITHROMAX Z-PAK) 250 MG tablet       ? 07/04/21 1457  ? ?  ?  ? ?  ? ? ? ?Note:  This document was prepared using Dragon voice recognition software and may include unintentional dictation errors. ?  ?Lucrezia Starch, MD ?07/04/21 1500 ? ?

## 2021-07-04 NOTE — Telephone Encounter (Signed)
LMTCB

## 2021-07-04 NOTE — ED Notes (Signed)
Pt to ED with daughter, pt was feeling weak and nauseous this morning with dull pressure-type CP around 9am. See triage note, pt received 325mg  aspirin by EMS. Temp was 103 in triage and received tylenol. ?

## 2021-07-04 NOTE — ED Triage Notes (Addendum)
Patient arrived by EMS from home with intermittent chest pain, nausea and generalized pain all over. EMS administered 4mg  zofran, 324 aspirin, and sodium chloride . Takes blood thinner daily ?

## 2021-07-06 ENCOUNTER — Other Ambulatory Visit: Payer: Self-pay

## 2021-07-06 DIAGNOSIS — E1140 Type 2 diabetes mellitus with diabetic neuropathy, unspecified: Secondary | ICD-10-CM

## 2021-07-06 MED ORDER — FREESTYLE LIBRE 2 SENSOR MISC
11 refills | Status: DC
  Filled 2021-07-06 – 2021-07-10 (×2): qty 2, 28d supply, fill #0

## 2021-07-06 NOTE — Telephone Encounter (Signed)
Correction ED f/u ?

## 2021-07-06 NOTE — Telephone Encounter (Signed)
Pt needed refill on her CGM sensors. Sent to [facility] so they can help with this. Patient also needed her appt moved up with Dr Lorin Picket for HFU. Appt changed to 4/4 ?

## 2021-07-09 LAB — CULTURE, BLOOD (ROUTINE X 2)
Culture: NO GROWTH
Culture: NO GROWTH
Special Requests: ADEQUATE

## 2021-07-10 ENCOUNTER — Ambulatory Visit (INDEPENDENT_AMBULATORY_CARE_PROVIDER_SITE_OTHER): Payer: Medicare HMO | Admitting: Internal Medicine

## 2021-07-10 ENCOUNTER — Other Ambulatory Visit: Payer: Self-pay

## 2021-07-10 VITALS — BP 140/76 | HR 70 | Temp 97.9°F | Resp 16 | Ht 62.0 in | Wt 196.6 lb

## 2021-07-10 DIAGNOSIS — N183 Chronic kidney disease, stage 3 unspecified: Secondary | ICD-10-CM

## 2021-07-10 DIAGNOSIS — E1122 Type 2 diabetes mellitus with diabetic chronic kidney disease: Secondary | ICD-10-CM

## 2021-07-10 DIAGNOSIS — J189 Pneumonia, unspecified organism: Secondary | ICD-10-CM

## 2021-07-10 DIAGNOSIS — I480 Paroxysmal atrial fibrillation: Secondary | ICD-10-CM

## 2021-07-10 DIAGNOSIS — I700 Atherosclerosis of aorta: Secondary | ICD-10-CM

## 2021-07-10 DIAGNOSIS — D6859 Other primary thrombophilia: Secondary | ICD-10-CM | POA: Diagnosis not present

## 2021-07-10 DIAGNOSIS — I10 Essential (primary) hypertension: Secondary | ICD-10-CM

## 2021-07-10 DIAGNOSIS — Z794 Long term (current) use of insulin: Secondary | ICD-10-CM

## 2021-07-10 DIAGNOSIS — E7800 Pure hypercholesterolemia, unspecified: Secondary | ICD-10-CM

## 2021-07-10 DIAGNOSIS — L299 Pruritus, unspecified: Secondary | ICD-10-CM

## 2021-07-10 LAB — CBC WITH DIFFERENTIAL/PLATELET
Basophils Absolute: 0.1 10*3/uL (ref 0.0–0.1)
Basophils Relative: 0.5 % (ref 0.0–3.0)
Eosinophils Absolute: 0.6 10*3/uL (ref 0.0–0.7)
Eosinophils Relative: 6.8 % — ABNORMAL HIGH (ref 0.0–5.0)
HCT: 40.9 % (ref 36.0–46.0)
Hemoglobin: 13.4 g/dL (ref 12.0–15.0)
Lymphocytes Relative: 17 % (ref 12.0–46.0)
Lymphs Abs: 1.6 10*3/uL (ref 0.7–4.0)
MCHC: 32.7 g/dL (ref 30.0–36.0)
MCV: 90.4 fl (ref 78.0–100.0)
Monocytes Absolute: 0.7 10*3/uL (ref 0.1–1.0)
Monocytes Relative: 7.9 % (ref 3.0–12.0)
Neutro Abs: 6.3 10*3/uL (ref 1.4–7.7)
Neutrophils Relative %: 67.8 % (ref 43.0–77.0)
Platelets: 285 10*3/uL (ref 150.0–400.0)
RBC: 4.52 Mil/uL (ref 3.87–5.11)
RDW: 13 % (ref 11.5–15.5)
WBC: 9.3 10*3/uL (ref 4.0–10.5)

## 2021-07-10 LAB — HEPATIC FUNCTION PANEL
ALT: 20 U/L (ref 0–35)
AST: 21 U/L (ref 0–37)
Albumin: 4.4 g/dL (ref 3.5–5.2)
Alkaline Phosphatase: 98 U/L (ref 39–117)
Bilirubin, Direct: 0.1 mg/dL (ref 0.0–0.3)
Total Bilirubin: 0.6 mg/dL (ref 0.2–1.2)
Total Protein: 7.5 g/dL (ref 6.0–8.3)

## 2021-07-10 LAB — BASIC METABOLIC PANEL
BUN: 20 mg/dL (ref 6–23)
CO2: 27 mEq/L (ref 19–32)
Calcium: 9.6 mg/dL (ref 8.4–10.5)
Chloride: 102 mEq/L (ref 96–112)
Creatinine, Ser: 1.13 mg/dL (ref 0.40–1.20)
GFR: 47.16 mL/min — ABNORMAL LOW (ref >60.00)
Glucose, Bld: 151 mg/dL — ABNORMAL HIGH (ref 70–99)
Potassium: 3.9 mEq/L (ref 3.5–5.1)
Sodium: 140 mEq/L (ref 135–145)

## 2021-07-10 NOTE — Progress Notes (Signed)
Patient ID: Beth Tyler, female   DOB: 06-21-1945, 76 y.o.   MRN: [id number] ? ? ?Subjective:  ? ? Patient ID: Beth Tyler, female    DOB: 06-21-1945, 76 y.o.   MRN: [id number] ? ?This visit occurred during the SARS-CoV-2 public health emergency.  Safety protocols were in place, including screening questions prior to the visit, additional usage of staff PPE, and extensive cleaning of exam room while observing appropriate contact time as indicated for disinfecting solutions.  ? ?Patient here for ED follow up.  ? ?Chief Complaint  ?Patient presents with  ? Follow-up  ?  ED follow up- PNA  ? .  ? ?HPI ?Presented to ER 07/04/21 - chest discomfort, body aches and fatigue.  Treated with zofran and aspirin.  CXR - New patchy alveolar infiltrate is seen in the right upper lobe ?suggesting pneumonia. White blood cell count 18.  COVID and influenza PCR negative.  Treated with augmentin and zpak.  Discharged.  She is feeling better.  No chest pain.  No increased sob.  Eating.  No vomiting.  Has been taking abx.  Rash noted. On questioning - she is allergic to pcn.  Allergies state -nausea only.  States has had rash previously.  Completed zpak.  Has one more day of augmentin.  Bowels moving more often.  ? ? ?Past Medical History:  ?Diagnosis Date  ? Anemia   ? Arthritis   ? B12 deficiency   ? Carpal tunnel syndrome, bilateral   ? COVID-19 03/18/2019  ? Diabetes mellitus ([facility]) 02/17/2012  ? Diabetes mellitus, type II ([facility])   ? Dyspnea   ? Gastritis   ? GERD (gastroesophageal reflux disease)   ? Hypercholesterolemia   ? Hypertension   ? IBS (irritable bowel syndrome)   ? Neuropathy   ? legs  ? Pernicious anemia   ? Vertigo   ? no episodes fo 9 or 10 yrs  ? ?Past Surgical History:  ?Procedure Laterality Date  ? ANTERIOR VITRECTOMY Right 12/01/2017  ? Procedure: ANTERIOR VITRECTOMY;  Surgeon: [place] CraneKing, Bradley Mark, MD;  [facility];  Service: Ophthalmology;  Laterality: Right;  ? BREAST BIOPSY Right 1980  ? neg   ? BREAST SURGERY  1980  ? benign cyst removal  ? CATARACT EXTRACTION W/PHACO Left 10/13/2017  ? Procedure: CATARACT EXTRACTION PHACO AND INTRAOCULAR LENS PLACEMENT (IOC)  LEFT  DIABETES;  Surgeon: [place] CraneKing, Bradley Mark, MD;  [facility];  Service: Ophthalmology;  Laterality: Left;  Diabetic - insulin and oral meds  ? CATARACT EXTRACTION W/PHACO Right 12/01/2017  ? Procedure: CATARACT EXTRACTION PHACO AND INTRAOCULAR LENS PLACEMENT (IOC)  RIGHT DIABETIC;  Surgeon: [place] CraneKing, Bradley Mark, MD;  [facility];  Service: Ophthalmology;  Laterality: Right;  Diabetic - insulin and oral  ? CHOLECYSTECTOMY  1983  ? ?Family History  ?Problem Relation Age of Onset  ? Diabetes Father   ? Kidney disease Father   ? Heart disease Father   ? Alcohol abuse Mother   ? Dementia Mother   ? Diabetes Mother   ? Diabetes Other   ? Hemophilia Brother   ? Uterine cancer Sister   ? Diabetes Sister   ? COPD Sister   ? Breast cancer Maternal Aunt 51  ? Breast cancer Daughter [phone]  ? Stroke Daughter   ? Diabetes Son   ? ?Social History  ? ?Socioeconomic History  ? Marital status: Widowed  ?  Spouse name: Not on file  ? Number of children:  4  ? Years of education: Not on file  ? Highest education level: Not on file  ?Occupational History  ? Not on file  ?Tobacco Use  ? Smoking status: Never  ? Smokeless tobacco: Never  ?Vaping Use  ? Vaping Use: Never used  ?Substance and Sexual Activity  ? Alcohol use: No  ?  Alcohol/week: 0.0 standard drinks  ? Drug use: No  ? Sexual activity: Not Currently  ?Other Topics Concern  ? Not on file  ?Social History Narrative  ? Widowed. Has four children. Manager of an apartment building. No tobacco, alcohol or other drug use  ? ?Social Determinants of Health  ? ?Financial Resource Strain: Medium Risk  ? Difficulty of Paying Living Expenses: Somewhat hard  ?Food Insecurity: Not on file  ?Transportation Needs: Not on file  ?Physical Activity: Not on file  ?Stress: Not on file  ?Social  Connections: Not on file  ? ? ? ?Review of Systems  ?Constitutional:  Negative for appetite change, fever and unexpected weight change.  ?HENT:  Negative for congestion and sinus pressure.   ?Respiratory:  Negative for cough and chest tightness.   ?     No increased sob.   ?Cardiovascular:  Negative for chest pain, palpitations and leg swelling.  ?Gastrointestinal:  Negative for abdominal pain, nausea and vomiting.  ?     Loose stools.   ?Genitourinary:  Negative for difficulty urinating and dysuria.  ?Musculoskeletal:  Negative for joint swelling and myalgias.  ?Skin:  Positive for rash. Negative for color change.  ?Neurological:  Negative for dizziness, light-headedness and headaches.  ?Psychiatric/Behavioral:  Negative for agitation and dysphoric mood.   ? ?   ?Objective:  ?  ? ?BP 140/76   Pulse 70   Temp 97.9 ?F (36.6 ?C)   Resp 16   Ht 5\' 2"  (1.575 m)   Wt 196 lb 9.6 oz (89.2 kg)   SpO2 98%   BMI 35.96 kg/m?  ?Wt Readings from Last 3 Encounters:  ?07/10/21 196 lb 9.6 oz (89.2 kg)  ?07/04/21 198 lb (89.8 kg)  ?06/21/21 198 lb (89.8 kg)  ? ? ?Physical Exam ?Vitals reviewed.  ?Constitutional:   ?   General: She is not in acute distress. ?   Appearance: Normal appearance.  ?HENT:  ?   Head: Normocephalic and atraumatic.  ?   Right Ear: External ear normal.  ?   Left Ear: External ear normal.  ?Eyes:  ?   General: No scleral icterus.    ?   Right eye: No discharge.     ?   Left eye: No discharge.  ?   Conjunctiva/sclera: Conjunctivae normal.  ?Neck:  ?   Thyroid: No thyromegaly.  ?Cardiovascular:  ?   Rate and Rhythm: Normal rate and regular rhythm.  ?Pulmonary:  ?   Effort: No respiratory distress.  ?   Breath sounds: Normal breath sounds. No wheezing.  ?Abdominal:  ?   General: Bowel sounds are normal.  ?   Palpations: Abdomen is soft.  ?   Tenderness: There is no abdominal tenderness.  ?Musculoskeletal:     ?   General: No swelling or tenderness.  ?   Cervical back: Neck supple. No tenderness.   ?Lymphadenopathy:  ?   Cervical: No cervical adenopathy.  ?Skin: ?   Findings: No erythema or rash.  ?Neurological:  ?   Mental Status: She is alert.  ?Psychiatric:     ?   Mood and Affect: Mood normal.     ?  Behavior: Behavior normal.  ? ? ? ?Outpatient Encounter Medications as of 07/10/2021  ?Medication Sig  ? acetaminophen (TYLENOL) 500 MG tablet Take 500-1,000 mg by mouth every 6 (six) hours as needed for mild pain or moderate pain.   ? albuterol (VENTOLIN HFA) 108 (90 Base) MCG/ACT inhaler Inhale 2 puffs into the lungs every 4 (four) hours as needed for wheezing or shortness of breath.  ? amLODipine (NORVASC) 10 MG tablet Take 1 tablet by mouth once daily  ? [EXPIRED] amoxicillin-clavulanate (AUGMENTIN) 875-125 MG tablet Take 1 tablet by mouth 2 (two) times daily for 7 days.  ? apixaban (ELIQUIS) 5 MG TABS tablet Take 1 tablet (5 mg total) by mouth 2 (two) times daily.  ? BD PEN NEEDLE NANO 2ND GEN 32G X 4 MM MISC USE TO INJECT INSULIN THREE TIMES DAILY  ? benzonatate (TESSALON) 100 MG capsule Take 2 capsules (200 mg total) by mouth every 8 (eight) hours.  ? Continuous Blood Gluc Sensor (FREESTYLE LIBRE 2 SENSOR) MISC USE TO CHECK BLOOD SUGARS AT LEAST 4 TIMES DAILY  ? empagliflozin ([name]) 25 MG TABS tablet Take 1 tablet (25 mg total) by mouth daily before breakfast.  ? ferrous sulfate 325 (65 FE) MG tablet Take 325 mg by mouth daily with breakfast.  ? fluticasone (CUTIVATE) 0.05 % cream Apply 1 application topically as needed. Use as directed.  ? glucose blood (ONETOUCH VERIO) test strip USE AS DIRECTED  ? Insulin Glargine (BASAGLAR KWIKPEN) 100 UNIT/ML Inject 22 Units into the skin every morning.  ? insulin lispro ([name] KWIKPEN) 100 UNIT/ML KwikPen Inject 8-14 Units into the skin 3 (three) times daily.  ? ipratropium (ATROVENT) 0.06 % nasal spray Place 2 sprays into both nostrils 4 (four) times daily.  ? magnesium oxide (MAG-OX) 400 (240 Mg) MG tablet Take 1 tablet by mouth once daily  ? metFORMIN  (GLUCOPHAGE-XR) 500 MG 24 hr tablet Take 2 tablets (1,000 mg total) by mouth 2 (two) times daily.  ? metoprolol tartrate (LOPRESSOR) 25 MG tablet Take 25 mg by mouth 2 (two) times daily.  ? Multiple Vitamin (MULTIVITAMIN) tablet

## 2021-07-14 ENCOUNTER — Encounter: Payer: Self-pay | Admitting: Internal Medicine

## 2021-07-14 DIAGNOSIS — J189 Pneumonia, unspecified organism: Secondary | ICD-10-CM | POA: Insufficient documentation

## 2021-07-14 NOTE — Assessment & Plan Note (Signed)
Continue insulin as she is doing.  Back on jardiance now.  Hold on making changes.  Low carb diet and exercise.  Follow met b and a1c.  ?

## 2021-07-14 NOTE — Assessment & Plan Note (Signed)
afib on eliquis.  

## 2021-07-14 NOTE — Assessment & Plan Note (Signed)
Continue metoprolol and eliquis.  Follow.  ?

## 2021-07-14 NOTE — Assessment & Plan Note (Signed)
Continue simvastatin. 

## 2021-07-14 NOTE — Assessment & Plan Note (Signed)
Recent evaluation - ED.  Found to have RUL pneumonia.  Treated with augmentin and zpak.  Completed zpak.  One more day augmentin.  Rash.  Will stop augmentin.  Symptoms improved.  No increased cough or congestion. Will need f/u cxr to confirm clearance.   ?

## 2021-07-14 NOTE — Assessment & Plan Note (Signed)
Continue simvastatin.  Low cholesterol diet and exercise.  Follow lipid panel and liver function tests.   

## 2021-07-14 NOTE — Assessment & Plan Note (Signed)
Continue metoprolol and amlodipine.  Blood pressure as outlined. Hold on making changes.  Has been sick. Follow pressures.  No changes made.  Follow metabolic panel.  ?

## 2021-07-17 ENCOUNTER — Ambulatory Visit
Admission: RE | Admit: 2021-07-17 | Discharge: 2021-07-17 | Disposition: A | Discharge status: Home / Self Care | Payer: Medicare HMO | Source: Ambulatory Visit | Attending: Internal Medicine | Admitting: Internal Medicine

## 2021-07-17 DIAGNOSIS — R944 Abnormal results of kidney function studies: Secondary | ICD-10-CM | POA: Diagnosis not present

## 2021-07-17 DIAGNOSIS — N281 Cyst of kidney, acquired: Secondary | ICD-10-CM | POA: Diagnosis not present

## 2021-07-18 ENCOUNTER — Ambulatory Visit: Payer: Medicare HMO | Admitting: Internal Medicine

## 2021-07-19 ENCOUNTER — Telehealth: Payer: Self-pay

## 2021-07-19 ENCOUNTER — Other Ambulatory Visit: Payer: Self-pay | Admitting: Internal Medicine

## 2021-07-19 DIAGNOSIS — I480 Paroxysmal atrial fibrillation: Secondary | ICD-10-CM

## 2021-07-19 NOTE — Telephone Encounter (Signed)
-----   Message from Dale Harvel, MD sent at 07/18/2021  4:08 AM EDT ----- ?Please call Beth Tyler and let her know that her renal ultrasound is ok.  No acute abnormality.  There was a simple cyst noted on right kidney.  No further f/u or imaging recommended.  ?

## 2021-07-19 NOTE — Telephone Encounter (Signed)
LMTCB in regards to results.  

## 2021-07-25 DIAGNOSIS — K580 Irritable bowel syndrome with diarrhea: Secondary | ICD-10-CM | POA: Diagnosis not present

## 2021-07-25 DIAGNOSIS — K8681 Exocrine pancreatic insufficiency: Secondary | ICD-10-CM | POA: Diagnosis not present

## 2021-07-25 DIAGNOSIS — Z1211 Encounter for screening for malignant neoplasm of colon: Secondary | ICD-10-CM | POA: Diagnosis not present

## 2021-07-30 ENCOUNTER — Ambulatory Visit (INDEPENDENT_AMBULATORY_CARE_PROVIDER_SITE_OTHER): Payer: Medicare HMO

## 2021-07-30 VITALS — BP 128/68 | HR 58 | Ht 62.0 in | Wt 196.0 lb

## 2021-07-30 DIAGNOSIS — Z0000 Encounter for general adult medical examination without abnormal findings: Secondary | ICD-10-CM | POA: Diagnosis not present

## 2021-07-30 NOTE — Progress Notes (Signed)
Subjective:   Beth Tyler is a 76 y.o. female who presents for Medicare Annual (Subsequent) preventive examination.  Review of Systems    No ROS.  Medicare Wellness Virtual Visit.  Visual/audio telehealth visit, UTA vital signs.   See social history for additional risk factors.   Cardiac Risk Factors include: advanced age (>69men, >70 women);diabetes mellitus     Objective:    Today's Vitals   07/30/21 0851  BP: 128/68  Pulse: (!) 58  SpO2: 97%  Weight: 196 lb (88.9 kg)  Height: 5\' 2"  (1.575 m)   Body mass index is 35.85 kg/m.     07/04/2021   11:16 AM 04/16/2021    4:32 PM 02/19/2020    8:27 AM 01/28/2020    9:00 AM 12/06/2019    6:45 PM 09/28/2019    4:58 PM 04/28/2019    3:36 PM  Advanced Directives  Does Patient Have a Medical Advance Directive? No No No Yes No No No  Type of Advance Directive    Living will     Does patient want to make changes to medical advance directive?    No - Patient declined     Would patient like information on creating a medical advance directive? No - Patient declined      No - Patient declined    Current Medications (verified) Outpatient Encounter Medications as of 07/30/2021  Medication Sig   acetaminophen (TYLENOL) 500 MG tablet Take 500-1,000 mg by mouth every 6 (six) hours as needed for mild pain or moderate pain.    albuterol (VENTOLIN HFA) 108 (90 Base) MCG/ACT inhaler Inhale 2 puffs into the lungs every 4 (four) hours as needed for wheezing or shortness of breath.   amLODipine (NORVASC) 10 MG tablet Take 1 tablet by mouth once daily   BD PEN NEEDLE NANO 2ND GEN 32G X 4 MM MISC USE TO INJECT INSULIN THREE TIMES DAILY   benzonatate (TESSALON) 100 MG capsule Take 2 capsules (200 mg total) by mouth every 8 (eight) hours.   Continuous Blood Gluc Sensor (FREESTYLE LIBRE 2 SENSOR) MISC USE TO CHECK BLOOD SUGARS AT LEAST 4 TIMES DAILY   ELIQUIS 5 MG TABS tablet Take 1 tablet by mouth twice daily   empagliflozin ([name]) 25 MG TABS  tablet Take 1 tablet (25 mg total) by mouth daily before breakfast.   ferrous sulfate 325 (65 FE) MG tablet Take 325 mg by mouth daily with breakfast.   fluticasone (CUTIVATE) 0.05 % cream Apply 1 application topically as needed. Use as directed.   glucose blood (ONETOUCH VERIO) test strip USE AS DIRECTED   Insulin Glargine (BASAGLAR KWIKPEN) 100 UNIT/ML Inject 22 Units into the skin every morning.   insulin lispro ([name] KWIKPEN) 100 UNIT/ML KwikPen Inject 8-14 Units into the skin 3 (three) times daily.   ipratropium (ATROVENT) 0.06 % nasal spray Place 2 sprays into both nostrils 4 (four) times daily.   magnesium oxide (MAG-OX) 400 (240 Mg) MG tablet Take 1 tablet by mouth once daily   metFORMIN (GLUCOPHAGE-XR) 500 MG 24 hr tablet Take 2 tablets (1,000 mg total) by mouth 2 (two) times daily.   metoprolol tartrate (LOPRESSOR) 25 MG tablet Take 25 mg by mouth 2 (two) times daily.   Multiple Vitamin (MULTIVITAMIN) tablet Take 1 tablet by mouth daily.   mupirocin ointment (BACTROBAN) 2 % Apply 1 application. topically 2 (two) times daily.   Pancrelipase, Lip-Prot-Amyl, ([name] PO) Take by mouth. Take 36,000-72,000 Units by mouth See admin instructions. Take  3 capsules by mouth three times daily with meals and take 2 capsule by mouth daily with snacks   pantoprazole (PROTONIX) 40 MG tablet TAKE 1 TABLET BY MOUTH TWICE DAILY BEFORE MEAL(S)   simvastatin (ZOCOR) 10 MG tablet TAKE 1 TABLET BY MOUTH AT BEDTIME   triamcinolone (KENALOG) 0.1 % APPLY  CREAM EXTERNALLY TWICE DAILY   vitamin B-12 (CYANOCOBALAMIN) 100 MCG tablet Take 100 mcg by mouth daily.   No facility-administered encounter medications on file as of 07/30/2021.    Allergies (verified) Hydrocodone, Hydrocodone-acetaminophen, Oxycodone-acetaminophen, Percocet [oxycodone-acetaminophen], Demeclocycline, Other, Penicillins, and Tetracyclines & related   History: Past Medical History:  Diagnosis Date   Anemia    Arthritis    B12  deficiency    Carpal tunnel syndrome, bilateral    COVID-19 03/18/2019   Diabetes mellitus (HCC) 02/17/2012   Diabetes mellitus, type II (HCC)    Dyspnea    Gastritis    GERD (gastroesophageal reflux disease)    Hypercholesterolemia    Hypertension    IBS (irritable bowel syndrome)    Neuropathy    legs   Pernicious anemia    Vertigo    no episodes fo 9 or 10 yrs   Past Surgical History:  Procedure Laterality Date   ANTERIOR VITRECTOMY Right 12/01/2017   Procedure: ANTERIOR VITRECTOMY;  Surgeon: Nevada Crane, MD;  Location: [facility];  Service: Ophthalmology;  Laterality: Right;   BREAST BIOPSY Right 1980   neg   BREAST SURGERY  1980   benign cyst removal   CATARACT EXTRACTION W/PHACO Left 10/13/2017   Procedure: CATARACT EXTRACTION PHACO AND INTRAOCULAR LENS PLACEMENT (IOC)  LEFT  DIABETES;  Surgeon: Nevada Crane, MD;  Location: [facility];  Service: Ophthalmology;  Laterality: Left;  Diabetic - insulin and oral meds   CATARACT EXTRACTION W/PHACO Right 12/01/2017   Procedure: CATARACT EXTRACTION PHACO AND INTRAOCULAR LENS PLACEMENT (IOC)  RIGHT DIABETIC;  Surgeon: Nevada Crane, MD;  Location: [facility];  Service: Ophthalmology;  Laterality: Right;  Diabetic - insulin and oral   CHOLECYSTECTOMY  1983   Family History  Problem Relation Age of Onset   Diabetes Father    Kidney disease Father    Heart disease Father    Alcohol abuse Mother    Dementia Mother    Diabetes Mother    Diabetes Other    Hemophilia Brother    Uterine cancer Sister    Diabetes Sister    COPD Sister    Breast cancer Maternal Aunt 71   Breast cancer Daughter 30   Stroke Daughter    Diabetes Son    Social History   Socioeconomic History   Marital status: Widowed    Spouse name: Not on file   Number of children: 4   Years of education: Not on file   Highest education level: Not on file  Occupational History   Not on file  Tobacco Use   Smoking  status: Never   Smokeless tobacco: Never  Vaping Use   Vaping Use: Never used  Substance and Sexual Activity   Alcohol use: No    Alcohol/week: 0.0 standard drinks   Drug use: No   Sexual activity: Not Currently  Other Topics Concern   Not on file  Social History Narrative   Widowed. Has four children. Manager of an apartment building. No tobacco, alcohol or other drug use   Social Determinants of Health   Financial Resource Strain: Medium Risk   Difficulty of Paying  Living Expenses: Somewhat hard  Food Insecurity: No Food Insecurity   Worried About Running Out of Food in the Last Year: Never true   Ran Out of Food in the Last Year: Never true  Transportation Needs: No Transportation Needs   Lack of Transportation (Medical): No   Lack of Transportation (Non-Medical): No  Physical Activity: Not on file  Stress: No Stress Concern Present   Feeling of Stress : Not at all  Social Connections: Moderately Integrated   Frequency of Communication with Friends and Family: More than three times a week   Frequency of Social Gatherings with Friends and Family: More than three times a week   Attends Religious Services: 1 to 4 times per year   Active Member of [employer] or Organizations: Yes   Attends Banker Meetings: 1 to 4 times per year   Marital Status: Widowed    Tobacco Counseling Counseling given: Not Answered   Clinical Intake:  Pre-visit preparation completed: Yes        Diabetes: Yes (Followed by PCP)  How often do you need to have someone help you when you read instructions, pamphlets, or other written materials from your doctor or pharmacy?: 1 - Never   Interpreter Needed?: No    Activities of Daily Living    07/30/2021    9:05 AM  In your present state of health, do you have any difficulty performing the following activities:  Hearing? 0  Vision? 0  Difficulty concentrating or making decisions? 0  Walking or climbing stairs? 1  Comment  Cane/walker in use as needed  Dressing or bathing? 0  Doing errands, shopping? 0  Preparing Food and eating ? N  Using the Toilet? N  In the past six months, have you accidently leaked urine? N  Do you have problems with loss of bowel control? N  Managing your Medications? N  Managing your Finances? N  Housekeeping or managing your Housekeeping? N    Patient Care Team: Dale Silver Peak, MD as PCP - General (Internal Medicine)  Indicate any recent Medical Services you may have received from other than [facility] providers in the past year (date may be approximate).     Assessment:   This is a routine wellness examination for Ledia.  Virtual Visit via Telephone Note  I connected with  [name] on 07/30/21 at  8:45 AM EDT by telephone and verified that I am speaking with the correct person using two identifiers. Persons participating in the virtual visit: patient/Nurse Health Advisor   I discussed the limitations of performing an evaluation and management service by telehealth. The patient expressed understanding and agreed to proceed. We continued and completed visit with audio only. Some vital signs may be absent or patient reported.   Hearing/Vision screen Hearing Screening - Comments:: Patient is able to hear conversational tones without difficulty. No issues reported. Vision Screening - Comments:: Cataract extraction, bilateral No retinopathy reported They have seen their ophthalmologist in the last 12 months.    Dietary issues and exercise activities discussed: Current Exercise Habits: Home exercise routine, Intensity: Mild Low carb diet Good water intake   Goals Addressed               This Visit's Progress     Patient Stated     Portion control (pt-stated)        Maintain weight Stay active       Depression Screen    07/30/2021    9:02 AM 02/16/2021  10:28 AM 01/28/2020    8:41 AM 03/29/2019    3:24 PM 01/27/2019   10:16 AM 12/09/2017   12:35 PM  12/17/2016   10:04 AM  PHQ 2/9 Scores  PHQ - 2 Score 0 0 0 0 0 0 0  PHQ- 9 Score       0    Fall Risk    07/30/2021    9:05 AM 02/16/2021   10:27 AM 01/28/2020    8:47 AM 04/16/2019   12:42 PM 01/27/2019   10:16 AM  Fall Risk   Falls in the past year? 0 1 0 0 0  Number falls in past yr: 0 1 0  0  Injury with Fall?  0     Follow up Falls evaluation completed Falls evaluation completed Falls evaluation completed     FALL RISK PREVENTION PERTAINING TO THE HOME: Home free of loose throw rugs in walkways, pet beds, electrical cords, etc? Yes  Adequate lighting in your home to reduce risk of falls? Yes   ASSISTIVE DEVICES UTILIZED TO PREVENT FALLS: Life alert? No  Use of a cane, walker or w/c? Yes , as needed.  TIMED UP AND GO: Was the test performed? No .   Cognitive Function: Patient is alert and oriented x3. Enjoys doing crafts and other brain health stimulating activities.      12/09/2017   12:42 PM 04/05/2016    9:49 AM 04/06/2015    9:46 AM  MMSE - Mini Mental State Exam  Orientation to time 5 5 5   Orientation to Place 5 5 5   Registration 3 3 3   Attention/ Calculation 5 5 5   Recall 3 3 3   Language- name 2 objects 2 2 2   Language- repeat 1 1 1   Language- follow 3 step command 3 3 3   Language- read & follow direction 1 1 1   Write a sentence 1 1 1   Copy design 1 1 1   Total score 30 30 30         01/28/2020    9:00 AM 01/27/2019   10:23 AM  6CIT Screen  What Year? 0 points 0 points  What month? 0 points 0 points  What time?  0 points  Count back from 20  0 points  Months in reverse 0 points 0 points  Repeat phrase  0 points  Total Score  0 points   Immunizations Immunization History  Administered Date(s) Administered   Fluad Quad(high Dose 65+) 12/21/2018, 01/28/2020, 01/16/2021   Influenza Split 02/04/2012   Influenza, High Dose Seasonal PF 12/19/2015, 12/17/2016, 01/08/2018   Influenza,inj,Quad PF,6+ Mos 01/22/2013, 01/19/2014, 12/29/2014   Moderna  Sars-Covid-2 Vaccination 07/07/2019   Pneumococcal Conjugate-13 02/06/2010, 06/11/2013   Pneumococcal Polysaccharide-23 02/11/2018   Screening Tests Health Maintenance  Topic Date Due   FOOT EXAM  07/07/2021   Zoster Vaccines- Shingrix (1 of 2) 09/21/2021 (Originally 09/24/1994)   TETANUS/TDAP  02/16/2022 (Originally 09/24/1963)   MAMMOGRAM  08/22/2021   INFLUENZA VACCINE  11/06/2021   URINE MICROALBUMIN  11/07/2021   HEMOGLOBIN A1C  12/20/2021   OPHTHALMOLOGY EXAM  02/09/2022   Pneumonia Vaccine 52+ Years old  Completed   DEXA SCAN  Completed   Hepatitis C Screening  Completed   HPV VACCINES  Aged Out   COLONOSCOPY (Pts 45-81yrs Insurance coverage will need to be confirmed)  Discontinued   COVID-19 Vaccine  Discontinued   Health Maintenance Health Maintenance Due  Topic Date Due   FOOT EXAM  07/07/2021   Mammogram- scheduled  08/23/21.  Lung Cancer Screening: (Low Dose CT Chest recommended if Age 74-80 years, 30 pack-year currently smoking OR have quit w/in 15years.) does not qualify.   Vision Screening: Recommended annual ophthalmology exams for early detection of glaucoma and other disorders of the eye.  Dental Screening: Recommended annual dental exams for proper oral hygiene  Community Resource Referral / Chronic Care Management: CRR required this visit?  No   CCM required this visit?  No      Plan:   Keep all routine maintenance appointments.   I have personally reviewed and noted the following in the patient's chart:   Medical and social history Use of alcohol, tobacco or illicit drugs  Current medications and supplements including opioid prescriptions.  Functional ability and status Nutritional status Physical activity Advanced directives List of other physicians Hospitalizations, surgeries, and ER visits in previous 12 months Vitals Screenings to include cognitive, depression, and falls Referrals and appointments  In addition, I have reviewed and  discussed with patient certain preventive protocols, quality metrics, and best practice recommendations. A written personalized care plan for preventive services as well as general preventive health recommendations were provided to patient.     Ashok Pall, LPN   1/61/0960

## 2021-07-30 NOTE — Patient Instructions (Addendum)
?  Beth Tyler , ?Thank you for taking time to come for your Medicare Wellness Visit. I appreciate your ongoing commitment to your health goals. Please review the following plan we discussed and let me know if I can assist you in the future.  ? ?These are the goals we discussed: ? Goals   ? ?  ? Patient Stated  ?   Portion control (pt-stated)   ?   Maintain weight ?Stay active ?  ? ?  ?  ?This is a list of the screening recommended for you and due dates:  ?Health Maintenance  ?Topic Date Due  ? Complete foot exam   07/07/2021  ? Zoster (Shingles) Vaccine (1 of 2) 09/21/2021*  ? Tetanus Vaccine  02/16/2022*  ? Mammogram  08/22/2021  ? Flu Shot  11/06/2021  ? Urine Protein Check  11/07/2021  ? Hemoglobin A1C  12/20/2021  ? Eye exam for diabetics  02/09/2022  ? Pneumonia Vaccine  Completed  ? DEXA scan (bone density measurement)  Completed  ? Hepatitis C Screening: USPSTF Recommendation to screen - Ages 38-79 yo.  Completed  ? HPV Vaccine  Aged Out  ? Colon Cancer Screening  Discontinued  ? COVID-19 Vaccine  Discontinued  ?*Topic was postponed. The date shown is not the original due date.  ?  ?

## 2021-07-31 ENCOUNTER — Telehealth: Payer: Medicare HMO

## 2021-07-31 ENCOUNTER — Encounter: Payer: Self-pay | Admitting: *Deleted

## 2021-08-14 ENCOUNTER — Ambulatory Visit (INDEPENDENT_AMBULATORY_CARE_PROVIDER_SITE_OTHER): Payer: Medicare HMO

## 2021-08-14 ENCOUNTER — Ambulatory Visit (INDEPENDENT_AMBULATORY_CARE_PROVIDER_SITE_OTHER): Payer: Medicare HMO | Admitting: Internal Medicine

## 2021-08-14 ENCOUNTER — Encounter: Payer: Self-pay | Admitting: Internal Medicine

## 2021-08-14 VITALS — BP 138/70 | HR 61 | Temp 98.3°F | Resp 16 | Ht 62.0 in | Wt 197.0 lb

## 2021-08-14 DIAGNOSIS — Z794 Long term (current) use of insulin: Secondary | ICD-10-CM

## 2021-08-14 DIAGNOSIS — E7800 Pure hypercholesterolemia, unspecified: Secondary | ICD-10-CM | POA: Diagnosis not present

## 2021-08-14 DIAGNOSIS — I700 Atherosclerosis of aorta: Secondary | ICD-10-CM | POA: Diagnosis not present

## 2021-08-14 DIAGNOSIS — I480 Paroxysmal atrial fibrillation: Secondary | ICD-10-CM

## 2021-08-14 DIAGNOSIS — Z8701 Personal history of pneumonia (recurrent): Secondary | ICD-10-CM | POA: Diagnosis not present

## 2021-08-14 DIAGNOSIS — Z9049 Acquired absence of other specified parts of digestive tract: Secondary | ICD-10-CM | POA: Diagnosis not present

## 2021-08-14 DIAGNOSIS — D6859 Other primary thrombophilia: Secondary | ICD-10-CM | POA: Diagnosis not present

## 2021-08-14 DIAGNOSIS — I10 Essential (primary) hypertension: Secondary | ICD-10-CM | POA: Diagnosis not present

## 2021-08-14 DIAGNOSIS — J189 Pneumonia, unspecified organism: Secondary | ICD-10-CM

## 2021-08-14 DIAGNOSIS — E1122 Type 2 diabetes mellitus with diabetic chronic kidney disease: Secondary | ICD-10-CM | POA: Diagnosis not present

## 2021-08-14 DIAGNOSIS — N183 Chronic kidney disease, stage 3 unspecified: Secondary | ICD-10-CM | POA: Diagnosis not present

## 2021-08-14 LAB — HM DIABETES FOOT EXAM

## 2021-08-14 MED ORDER — LISINOPRIL 20 MG PO TABS: 20.0000 mg | ORAL_TABLET | Freq: Every day | ORAL | 2 refills | Status: DC

## 2021-08-14 NOTE — Progress Notes (Signed)
Patient ID: Beth Tyler, female   DOB: 1945/10/03, 76 y.o.   MRN: [id number] ? ? ?Subjective:  ? ? Patient ID: Beth Tyler, female    DOB: 01/05/1946, 76 y.o.   MRN: [id number] ? ?This visit occurred during the SARS-CoV-2 public health emergency.  Safety protocols were in place, including screening questions prior to the visit, additional usage of staff PPE, and extensive cleaning of exam room while observing appropriate contact time as indicated for disinfecting solutions.  ? ?Patient here for a scheduled follow up.  ? ?Chief Complaint  ?Patient presents with  ? Follow-up  ?  Follow up for HTN  ? Hypertension  ? Hyperlipidemia  ? .  ? ?HPI ?Saw GI 07/23/21 - f/u IBS and EPI.  On creon.  Adjusting diet.  Stable bowel issues, but pain improved. Eating, but not as hungry. Planning cologuard.  Is on insulin - basaglar 24 units in am and humalog - averages 6-12 units with meals.  Also taking jardiance.  Unable to download her sugars.  Reviewed meter.  Sugars are varying.  Has had issues with low sugars in past.  A1c elevated.  Given GI issues, avoid GLP 1 agonist.  Discussed referral to endocrinology. Was seen ER 07/04/21 - diagnosed with pneumonia.  Treated with augmentin and zpak.  Better.  No cough or congestion.  Breathing stable.  Discussed need for f/u cxr.  No chest pain.   ? ? ?Past Medical History:  ?Diagnosis Date  ? Anemia   ? Arthritis   ? B12 deficiency   ? Carpal tunnel syndrome, bilateral   ? COVID-19 03/18/2019  ? Diabetes mellitus (HCC) 02/17/2012  ? Diabetes mellitus, type II (HCC)   ? Dyspnea   ? Gastritis   ? GERD (gastroesophageal reflux disease)   ? Hypercholesterolemia   ? Hypertension   ? IBS (irritable bowel syndrome)   ? Neuropathy   ? legs  ? Pernicious anemia   ? Vertigo   ? no episodes fo 9 or 10 yrs  ? ?Past Surgical History:  ?Procedure Laterality Date  ? ANTERIOR VITRECTOMY Right 12/01/2017  ? Procedure: ANTERIOR VITRECTOMY;  Surgeon: Nevada Crane, MD;  Location: [facility];  Service: Ophthalmology;  Laterality: Right;  ? BREAST BIOPSY Right 1980  ? neg  ? BREAST SURGERY  1980  ? benign cyst removal  ? CATARACT EXTRACTION W/PHACO Left 10/13/2017  ? Procedure: CATARACT EXTRACTION PHACO AND INTRAOCULAR LENS PLACEMENT (IOC)  LEFT  DIABETES;  Surgeon: Nevada Crane, MD;  Location: [facility];  Service: Ophthalmology;  Laterality: Left;  Diabetic - insulin and oral meds  ? CATARACT EXTRACTION W/PHACO Right 12/01/2017  ? Procedure: CATARACT EXTRACTION PHACO AND INTRAOCULAR LENS PLACEMENT (IOC)  RIGHT DIABETIC;  Surgeon: Nevada Crane, MD;  Location: [facility];  Service: Ophthalmology;  Laterality: Right;  Diabetic - insulin and oral  ? CHOLECYSTECTOMY  1983  ? ?Family History  ?Problem Relation Age of Onset  ? Diabetes Father   ? Kidney disease Father   ? Heart disease Father   ? Alcohol abuse Mother   ? Dementia Mother   ? Diabetes Mother   ? Diabetes Other   ? Hemophilia Brother   ? Uterine cancer Sister   ? Diabetes Sister   ? COPD Sister   ? Breast cancer Maternal Aunt 51  ? Breast cancer Daughter 43  ? Stroke Daughter   ? Diabetes Son   ? ?Social History  ? ?Socioeconomic History  ?  Marital status: Widowed  ?  Spouse name: Not on file  ? Number of children: 4  ? Years of education: Not on file  ? Highest education level: Not on file  ?Occupational History  ? Not on file  ?Tobacco Use  ? Smoking status: Never  ? Smokeless tobacco: Never  ?Vaping Use  ? Vaping Use: Never used  ?Substance and Sexual Activity  ? Alcohol use: No  ?  Alcohol/week: 0.0 standard drinks  ? Drug use: No  ? Sexual activity: Not Currently  ?Other Topics Concern  ? Not on file  ?Social History Narrative  ? Widowed. Has four children. Manager of an apartment building. No tobacco, alcohol or other drug use  ? ?Social Determinants of Health  ? ?Financial Resource Strain: Medium Risk  ? Difficulty of Paying Living Expenses: Somewhat hard  ?Food Insecurity: No Food Insecurity  ? Worried  About Programme researcher, broadcasting/film/videounning Out of Food in the Last Year: Never true  ? Ran Out of Food in the Last Year: Never true  ?Transportation Needs: No Transportation Needs  ? Lack of Transportation (Medical): No  ? Lack of Transportation (Non-Medical): No  ?Physical Activity: Not on file  ?Stress: No Stress Concern Present  ? Feeling of Stress : Not at all  ?Social Connections: Moderately Integrated  ? Frequency of Communication with Friends and Family: More than three times a week  ? Frequency of Social Gatherings with Friends and Family: More than three times a week  ? Attends Religious Services: 1 to 4 times per year  ? Active Member of Clubs or Organizations: Yes  ? Attends BankerClub or Organization Meetings: 1 to 4 times per year  ? Marital Status: Widowed  ? ? ? ?Review of Systems  ?Constitutional:  Negative for fever and unexpected weight change.  ?HENT:  Negative for congestion and sinus pressure.   ?Respiratory:  Negative for cough and chest tightness.   ?     Breathing stable.   ?Cardiovascular:  Negative for chest pain and palpitations.  ?     No increased swelling.   ?Gastrointestinal:  Negative for abdominal pain, diarrhea, nausea and vomiting.  ?Genitourinary:  Negative for difficulty urinating and dysuria.  ?Musculoskeletal:  Negative for joint swelling and myalgias.  ?Skin:  Negative for color change and rash.  ?Neurological:  Negative for dizziness, light-headedness and headaches.  ?Psychiatric/Behavioral:  Negative for agitation and dysphoric mood.   ? ?   ?Objective:  ?  ? ?BP 138/70 (BP Location: Left Arm, Patient Position: Sitting, Cuff Size: Small)   Pulse 61   Temp 98.3 ?F (36.8 ?C) (Temporal)   Resp 16   Ht 5\' 2"  (1.575 m)   Wt 197 lb (89.4 kg)   SpO2 97%   BMI 36.03 kg/m?  ?Wt Readings from Last 3 Encounters:  ?08/14/21 197 lb (89.4 kg)  ?07/30/21 196 lb (88.9 kg)  ?07/10/21 196 lb 9.6 oz (89.2 kg)  ? ? ?Physical Exam ?Vitals reviewed.  ?Constitutional:   ?   General: She is not in acute distress. ?    Appearance: Normal appearance.  ?HENT:  ?   Head: Normocephalic and atraumatic.  ?   Right Ear: External ear normal.  ?   Left Ear: External ear normal.  ?Eyes:  ?   General: No scleral icterus.    ?   Right eye: No discharge.     ?   Left eye: No discharge.  ?   Conjunctiva/sclera: Conjunctivae normal.  ?Neck:  ?  Thyroid: No thyromegaly.  ?Cardiovascular:  ?   Rate and Rhythm: Normal rate.  ?   Comments: Rate controlled.  ?Pulmonary:  ?   Effort: No respiratory distress.  ?   Breath sounds: Normal breath sounds. No wheezing.  ?Abdominal:  ?   General: Bowel sounds are normal.  ?   Palpations: Abdomen is soft.  ?   Tenderness: There is no abdominal tenderness.  ?Musculoskeletal:     ?   General: No tenderness.  ?   Cervical back: Neck supple. No tenderness.  ?   Comments: No increased swelling.   ?Lymphadenopathy:  ?   Cervical: No cervical adenopathy.  ?Skin: ?   Findings: No erythema or rash.  ?Neurological:  ?   Mental Status: She is alert.  ?Psychiatric:     ?   Mood and Affect: Mood normal.     ?   Behavior: Behavior normal.  ? ? ? ?Outpatient Encounter Medications as of 08/14/2021  ?Medication Sig  ? acetaminophen (TYLENOL) 500 MG tablet Take 500-1,000 mg by mouth every 6 (six) hours as needed for mild pain or moderate pain.   ? albuterol (VENTOLIN HFA) 108 (90 Base) MCG/ACT inhaler Inhale 2 puffs into the lungs every 4 (four) hours as needed for wheezing or shortness of breath.  ? amLODipine (NORVASC) 10 MG tablet Take 1 tablet by mouth once daily  ? BD PEN NEEDLE NANO 2ND GEN 32G X 4 MM MISC USE TO INJECT INSULIN THREE TIMES DAILY  ? benzonatate (TESSALON) 100 MG capsule Take 2 capsules (200 mg total) by mouth every 8 (eight) hours.  ? ELIQUIS 5 MG TABS tablet Take 1 tablet by mouth twice daily  ? empagliflozin ([name]) 25 MG TABS tablet Take 1 tablet (25 mg total) by mouth daily before breakfast.  ? ferrous sulfate 325 (65 FE) MG tablet Take 325 mg by mouth daily with breakfast.  ? fluticasone (CUTIVATE)  0.05 % cream Apply 1 application topically as needed. Use as directed.  ? glucose blood (ONETOUCH VERIO) test strip USE AS DIRECTED  ? Insulin Glargine (BASAGLAR KWIKPEN) 100 UNIT/ML Inject 22 Units into the skin every

## 2021-08-16 ENCOUNTER — Other Ambulatory Visit: Payer: Self-pay | Admitting: Internal Medicine

## 2021-08-16 ENCOUNTER — Encounter: Payer: Self-pay | Admitting: Internal Medicine

## 2021-08-16 DIAGNOSIS — Z794 Long term (current) use of insulin: Secondary | ICD-10-CM

## 2021-08-16 NOTE — Assessment & Plan Note (Signed)
On jardiance.  Also basaglar and humalog as outlined.  Discussed diet and exercise.  Discussed persistent elevation and varying sugars.  Has had issues with low sugars previously.  Avoid GLP 1 agonist given persistent GI issues.  Discussed referral to endocrinology for further treatment recommendations.  Question if insulin pump, etc beneficial.  Follow met b and a1c.  ?

## 2021-08-16 NOTE — Assessment & Plan Note (Signed)
Continue simvastatin. 

## 2021-08-16 NOTE — Assessment & Plan Note (Signed)
Continue metoprolol and eliquis.  Follow.  ?

## 2021-08-16 NOTE — Assessment & Plan Note (Signed)
Continue simvastatin.  Low cholesterol diet and exercise.  Follow lipid panel and liver function tests.   

## 2021-08-16 NOTE — Assessment & Plan Note (Signed)
Continue metoprolol and amlodipine.  Blood pressure as outlined. Elevated.  Remaining elevated.  Reviewed medication.  Discussed restarting ace inhibitor.  Has never had allergic reaction to any blood pressure medication.  Was on lisinopril for years.  Was stopped in hospital when blood pressure low.  Will restart lisinopril 20mg  q day.  Follow pressures.  Follow metabolic panel.  ?

## 2021-08-16 NOTE — Assessment & Plan Note (Signed)
afib on eliquis.  

## 2021-08-16 NOTE — Assessment & Plan Note (Signed)
Recent diagnosis of pneumonia.  Treated with augmentin and zpak.  Symptoms resolved.  Recheck cxr today to confirm clearance.  ?

## 2021-08-21 ENCOUNTER — Telehealth: Payer: Self-pay

## 2021-08-21 NOTE — Telephone Encounter (Signed)
Lm for pt to cb re: chest xray results ? ?----- Message from Dale Adair, MD sent at 08/16/2021  5:08 AM EDT ----- ?Please call and notify Beth Tyler that her cxr reveal that previous pneumonia has cleared.  Lungs clear.  CXR ok.  ?

## 2021-08-21 NOTE — Telephone Encounter (Signed)
-----   Message from Dale Rosenhayn, MD sent at 08/16/2021  5:08 AM EDT ----- ?Please call and notify Ms Skarda that her cxr reveal that previous pneumonia has cleared.  Lungs clear.  CXR ok.  ?

## 2021-08-23 ENCOUNTER — Ambulatory Visit
Admission: RE | Admit: 2021-08-23 | Discharge: 2021-08-23 | Disposition: A | Discharge status: Home / Self Care | Payer: Medicare HMO | Source: Ambulatory Visit | Attending: Internal Medicine | Admitting: Internal Medicine

## 2021-08-23 DIAGNOSIS — Z1231 Encounter for screening mammogram for malignant neoplasm of breast: Secondary | ICD-10-CM | POA: Diagnosis not present

## 2021-08-29 DIAGNOSIS — Z1211 Encounter for screening for malignant neoplasm of colon: Secondary | ICD-10-CM | POA: Diagnosis not present

## 2021-09-07 LAB — COLOGUARD: COLOGUARD: NEGATIVE

## 2021-09-21 ENCOUNTER — Ambulatory Visit (INDEPENDENT_AMBULATORY_CARE_PROVIDER_SITE_OTHER): Payer: Medicare HMO | Admitting: Internal Medicine

## 2021-09-21 ENCOUNTER — Encounter: Payer: Self-pay | Admitting: Internal Medicine

## 2021-09-21 VITALS — BP 138/82 | HR 64 | Temp 98.3°F | Resp 15 | Ht 62.0 in | Wt 200.0 lb

## 2021-09-21 DIAGNOSIS — E7800 Pure hypercholesterolemia, unspecified: Secondary | ICD-10-CM

## 2021-09-21 DIAGNOSIS — I700 Atherosclerosis of aorta: Secondary | ICD-10-CM

## 2021-09-21 DIAGNOSIS — I480 Paroxysmal atrial fibrillation: Secondary | ICD-10-CM

## 2021-09-21 DIAGNOSIS — E1140 Type 2 diabetes mellitus with diabetic neuropathy, unspecified: Secondary | ICD-10-CM | POA: Diagnosis not present

## 2021-09-21 DIAGNOSIS — D6859 Other primary thrombophilia: Secondary | ICD-10-CM

## 2021-09-21 DIAGNOSIS — I10 Essential (primary) hypertension: Secondary | ICD-10-CM | POA: Diagnosis not present

## 2021-09-21 DIAGNOSIS — E1122 Type 2 diabetes mellitus with diabetic chronic kidney disease: Secondary | ICD-10-CM

## 2021-09-21 DIAGNOSIS — E162 Hypoglycemia, unspecified: Secondary | ICD-10-CM | POA: Diagnosis not present

## 2021-09-21 DIAGNOSIS — Z794 Long term (current) use of insulin: Secondary | ICD-10-CM | POA: Diagnosis not present

## 2021-09-21 DIAGNOSIS — N183 Chronic kidney disease, stage 3 unspecified: Secondary | ICD-10-CM

## 2021-09-21 DIAGNOSIS — Z0000 Encounter for general adult medical examination without abnormal findings: Secondary | ICD-10-CM | POA: Diagnosis not present

## 2021-09-21 DIAGNOSIS — J189 Pneumonia, unspecified organism: Secondary | ICD-10-CM

## 2021-09-21 MED ORDER — FLUTICASONE PROPIONATE 0.05 % EX CREA
1.0000 | TOPICAL_CREAM | CUTANEOUS | 0 refills | Status: DC | PRN
Start: 2021-09-21 — End: 2021-12-28

## 2021-09-21 MED ORDER — TRIAMCINOLONE ACETONIDE 0.1 % EX CREA: TOPICAL_CREAM | CUTANEOUS | 0 refills | Status: DC

## 2021-09-21 MED ORDER — BD PEN NEEDLE NANO 2ND GEN 32G X 4 MM MISC: 0 refills | Status: DC

## 2021-09-21 NOTE — Progress Notes (Unsigned)
Patient ID: Beth Tyler, female   DOB: 1945-11-26, 76 y.o.   MRN: [id number]   Subjective:    Patient ID: Beth Tyler, female    DOB: [birth year], 76 y.o.   MRN: [id number]  This visit occurred during the SARS-CoV-2 public health emergency.  Safety protocols were in place, including screening questions prior to the visit, additional usage of staff PPE, and extensive cleaning of exam room while observing appropriate contact time as indicated for disinfecting solutions.   Patient here for  Chief Complaint  Patient presents with   Follow-up    Yearly CPE   Diabetes   Hyperlipidemia   Hypertension   .   HPI    Past Medical History:  Diagnosis Date   Anemia    Arthritis    B12 deficiency    Carpal tunnel syndrome, bilateral    COVID-19 03/18/2019   Diabetes mellitus (HCC) 02/17/2012   Diabetes mellitus, type II (HCC)    Dyspnea    Gastritis    GERD (gastroesophageal reflux disease)    Hypercholesterolemia    Hypertension    IBS (irritable bowel syndrome)    Neuropathy    legs   Pernicious anemia    Vertigo    no episodes fo 9 or 10 yrs   Past Surgical History:  Procedure Laterality Date   ANTERIOR VITRECTOMY Right 12/01/2017   Procedure: ANTERIOR VITRECTOMY;  Surgeon: Nevada Crane, MD;  Location: [facility];  Service: Ophthalmology;  Laterality: Right;   BREAST BIOPSY Right 1980   neg   BREAST EXCISIONAL BIOPSY Right 1980   CATARACT EXTRACTION W/PHACO Left 10/13/2017   Procedure: CATARACT EXTRACTION PHACO AND INTRAOCULAR LENS PLACEMENT (IOC)  LEFT  DIABETES;  Surgeon: Nevada Crane, MD;  Location: [facility];  Service: Ophthalmology;  Laterality: Left;  Diabetic - insulin and oral meds   CATARACT EXTRACTION W/PHACO Right 12/01/2017   Procedure: CATARACT EXTRACTION PHACO AND INTRAOCULAR LENS PLACEMENT (IOC)  RIGHT DIABETIC;  Surgeon: Nevada Crane, MD;  Location: [facility];  Service: Ophthalmology;  Laterality: Right;   Diabetic - insulin and oral   CHOLECYSTECTOMY  1983   Family History  Problem Relation Age of Onset   Diabetes Father    Kidney disease Father    Heart disease Father    Alcohol abuse Mother    Dementia Mother    Diabetes Mother    Diabetes Other    Hemophilia Brother    Uterine cancer Sister    Diabetes Sister    COPD Sister    Breast cancer Maternal Aunt 52   Breast cancer Daughter 4   Stroke Daughter    Diabetes Son    Social History   Socioeconomic History   Marital status: Widowed    Spouse name: Not on file   Number of children: 4   Years of education: Not on file   Highest education level: Not on file  Occupational History   Not on file  Tobacco Use   Smoking status: Never   Smokeless tobacco: Never  Vaping Use   Vaping Use: Never used  Substance and Sexual Activity   Alcohol use: No    Alcohol/week: 0.0 standard drinks of alcohol   Drug use: No   Sexual activity: Not Currently  Other Topics Concern   Not on file  Social History Narrative   Widowed. Has four children. Manager of an apartment building. No tobacco, alcohol or other drug use   Social Determinants of  Health   Financial Resource Strain: Medium Risk (05/02/2021)   Overall Financial Resource Strain (CARDIA)    Difficulty of Paying Living Expenses: Somewhat hard  Food Insecurity: No Food Insecurity (07/30/2021)   Hunger Vital Sign    Worried About Running Out of Food in the Last Year: Never true    Ran Out of Food in the Last Year: Never true  Transportation Needs: No Transportation Needs (07/30/2021)   PRAPARE - Administrator, Civil Service (Medical): No    Lack of Transportation (Non-Medical): No  Physical Activity: Not on file  Stress: No Stress Concern Present (07/30/2021)   [employer] of Occupational Health - Occupational Stress Questionnaire    Feeling of Stress : Not at all  Social Connections: Moderately Integrated (07/30/2021)   Social Connection and Isolation  Panel [NHANES]    Frequency of Communication with Friends and Family: More than three times a week    Frequency of Social Gatherings with Friends and Family: More than three times a week    Attends Religious Services: 1 to 4 times per year    Active Member of [employer] or Organizations: Yes    Attends Banker Meetings: 1 to 4 times per year    Marital Status: Widowed     Review of Systems     Objective:     BP 138/82 (BP Location: Left Arm, Patient Position: Sitting, Cuff Size: Small)   Pulse 64   Temp 98.3 F (36.8 C) (Temporal)   Resp 15   Ht 5\' 2"  (1.575 m)   Wt 200 lb (90.7 kg)   SpO2 98%   BMI 36.58 kg/m  Wt Readings from Last 3 Encounters:  09/21/21 200 lb (90.7 kg)  08/14/21 197 lb (89.4 kg)  07/30/21 196 lb (88.9 kg)    Physical Exam   Outpatient Encounter Medications as of 09/21/2021  Medication Sig   acetaminophen (TYLENOL) 500 MG tablet Take 500-1,000 mg by mouth every 6 (six) hours as needed for mild pain or moderate pain.    albuterol (VENTOLIN HFA) 108 (90 Base) MCG/ACT inhaler Inhale 2 puffs into the lungs every 4 (four) hours as needed for wheezing or shortness of breath.   amLODipine (NORVASC) 10 MG tablet Take 1 tablet by mouth once daily   BD PEN NEEDLE NANO 2ND GEN 32G X 4 MM MISC USE TO INJECT INSULIN 3 TIMES DAILY   benzonatate (TESSALON) 100 MG capsule Take 2 capsules (200 mg total) by mouth every 8 (eight) hours.   Continuous Blood Gluc Sensor (FREESTYLE LIBRE 2 SENSOR) MISC USE TO CHECK BLOOD SUGARS AT LEAST 4 TIMES DAILY   ELIQUIS 5 MG TABS tablet Take 1 tablet by mouth twice daily   empagliflozin ([name]) 25 MG TABS tablet Take 1 tablet (25 mg total) by mouth daily before breakfast.   ferrous sulfate 325 (65 FE) MG tablet Take 325 mg by mouth daily with breakfast.   fluticasone (CUTIVATE) 0.05 % cream Apply 1 application topically as needed. Use as directed.   glucose blood (ONETOUCH VERIO) test strip USE AS DIRECTED   Insulin  Glargine (BASAGLAR KWIKPEN) 100 UNIT/ML Inject 22 Units into the skin every morning.   insulin lispro ([name] KWIKPEN) 100 UNIT/ML KwikPen Inject 8-14 Units into the skin 3 (three) times daily.   ipratropium (ATROVENT) 0.06 % nasal spray Place 2 sprays into both nostrils 4 (four) times daily.   lisinopril (ZESTRIL) 20 MG tablet Take 1 tablet (20 mg total) by mouth daily.  magnesium oxide (MAG-OX) 400 (240 Mg) MG tablet Take 1 tablet by mouth once daily   metFORMIN (GLUCOPHAGE-XR) 500 MG 24 hr tablet Take 2 tablets (1,000 mg total) by mouth 2 (two) times daily.   metoprolol tartrate (LOPRESSOR) 25 MG tablet Take 25 mg by mouth 2 (two) times daily.   Multiple Vitamin (MULTIVITAMIN) tablet Take 1 tablet by mouth daily.   mupirocin ointment (BACTROBAN) 2 % Apply 1 application. topically 2 (two) times daily.   Pancrelipase, Lip-Prot-Amyl, ([name] PO) Take by mouth. Take 36,000-72,000 Units by mouth See admin instructions. Take 3 capsules by mouth three times daily with meals and take 2 capsule by mouth daily with snacks   pantoprazole (PROTONIX) 40 MG tablet TAKE 1 TABLET BY MOUTH TWICE DAILY BEFORE MEAL(S)   simvastatin (ZOCOR) 10 MG tablet TAKE 1 TABLET BY MOUTH AT BEDTIME   triamcinolone (KENALOG) 0.1 % APPLY  CREAM EXTERNALLY TWICE DAILY   vitamin B-12 (CYANOCOBALAMIN) 100 MCG tablet Take 100 mcg by mouth daily.   No facility-administered encounter medications on file as of 09/21/2021.     Lab Results  Component Value Date   WBC 9.3 07/10/2021   HGB 13.4 07/10/2021   HCT 40.9 07/10/2021   PLT 285.0 07/10/2021   GLUCOSE 151 (H) 07/10/2021   CHOL 109 06/19/2021   TRIG 133.0 06/19/2021   HDL 57.50 06/19/2021   LDLDIRECT 74.4 02/21/2012   LDLCALC 25 06/19/2021   ALT 20 07/10/2021   AST 21 07/10/2021   NA 140 07/10/2021   K 3.9 07/10/2021   CL 102 07/10/2021   CREATININE 1.13 07/10/2021   BUN 20 07/10/2021   CO2 27 07/10/2021   TSH 1.88 06/19/2021   INR 1.2 07/04/2021   HGBA1C 7.6  (H) 06/19/2021   MICROALBUR 3.1 (H) 11/07/2020    MM 3D SCREEN BREAST BILATERAL  Result Date: 08/23/2021 CLINICAL DATA:  Screening. EXAM: DIGITAL SCREENING BILATERAL MAMMOGRAM WITH TOMOSYNTHESIS AND CAD TECHNIQUE: Bilateral screening digital craniocaudal and mediolateral oblique mammograms were obtained. Bilateral screening digital breast tomosynthesis was performed. The images were evaluated with computer-aided detection. COMPARISON:  Previous exam(s). ACR Breast Density Category b: There are scattered areas of fibroglandular density. FINDINGS: There are no findings suspicious for malignancy. IMPRESSION: No mammographic evidence of malignancy. A result letter of this screening mammogram will be mailed directly to the patient. RECOMMENDATION: Screening mammogram in one year. (Code:SM-B-01Y) BI-RADS CATEGORY  1: Negative. Electronically Signed   By: Amie Portland M.D.   On: 08/23/2021 15:21       Assessment & Plan:   Problem List Items Addressed This Visit     Benign essential hypertension   Hypercholesterolemia - Primary   Other Visit Diagnoses     Type 2 diabetes mellitus with diabetic neuropathy, with long-term current use of insulin (HCC)            Dale Innsbrook, MD

## 2021-09-22 ENCOUNTER — Telehealth: Payer: Self-pay | Admitting: Internal Medicine

## 2021-09-22 ENCOUNTER — Encounter: Payer: Self-pay | Admitting: Internal Medicine

## 2021-09-22 DIAGNOSIS — E162 Hypoglycemia, unspecified: Secondary | ICD-10-CM | POA: Insufficient documentation

## 2021-09-22 LAB — BASIC METABOLIC PANEL WITH GFR
BUN/Creatinine Ratio: 27 (calc) — ABNORMAL HIGH (ref 6–22)
BUN: 27 mg/dL — ABNORMAL HIGH (ref 7–25)
CO2: 26 mmol/L (ref 20–32)
Calcium: 9.7 mg/dL (ref 8.6–10.4)
Chloride: 106 mmol/L (ref 98–110)
Creat: 1 mg/dL (ref 0.60–1.00)
Glucose, Bld: 104 mg/dL — ABNORMAL HIGH (ref 65–99)
Potassium: 4.2 mmol/L (ref 3.5–5.3)
Sodium: 142 mmol/L (ref 135–146)
eGFR: 58 mL/min/{1.73_m2} — ABNORMAL LOW (ref >=60)

## 2021-09-22 NOTE — Assessment & Plan Note (Signed)
Continue metoprolol and eliquis.  Follow.  See if can help with pt assistance for eliquis.

## 2021-09-22 NOTE — Assessment & Plan Note (Signed)
Continue simvastatin.  Low cholesterol diet and exercise.  Follow lipid panel and liver function tests.   

## 2021-09-22 NOTE — Assessment & Plan Note (Signed)
Did not eat prior to coming in - secondary to bowel issues.  Sugar 74.  Given crackers and juice.  110 prior to leaving.  Follow for low sugars.  Do not go long periods without eating.  Has sensor.

## 2021-09-22 NOTE — Assessment & Plan Note (Signed)
afib on eliquis.  

## 2021-09-22 NOTE — Assessment & Plan Note (Addendum)
On jardiance.  Also basaglar and humalog as outlined.  Discussed diet and exercise.  She has adjusted and has been watching diet.  Sugars trending down.  Has had issues with low sugars previously.  Avoid GLP 1 agonist given persistent GI issues.  Discussed referral to endocrinology for further treatment recommendations.  Question if insulin pump, etc beneficial.  Follow met b and a1c.

## 2021-09-22 NOTE — Assessment & Plan Note (Signed)
Continue simvastatin. 

## 2021-09-22 NOTE — Assessment & Plan Note (Signed)
Follow up cxr 08/2021 - Resolution of previously noted right upper lobe pneumonia. No new acute findings.

## 2021-09-22 NOTE — Assessment & Plan Note (Signed)
Physical today 09/21/21.  Mammogram 08/23/21 - Birads I.  Colonoscopy 2013 - < 2mm polyp in rectum and diverticulosis.  cologuard 08/29/21 - negative.

## 2021-09-22 NOTE — Telephone Encounter (Signed)
Saw her for physical appt 09/21/21.  She has not heard from endocrinology regarding an appt.  Referral placed last visit 08/14/21.  Can you help get this scheduled.  Thanks.

## 2021-09-22 NOTE — Assessment & Plan Note (Addendum)
Continue metoprolol and amlodipine.  Blood pressure as outlined. On lisinopril 20mg  q day.  Restarted last visit.  Previously had a couple of episodes of low pressures, but has not noticed this past week.  Blood pressure here as outlined.  128/64 on my check.  Hold on changing medication. Follow pressures.  Follow metabolic panel. Check today, given restarted lisinopril last visit.

## 2021-09-22 NOTE — Telephone Encounter (Signed)
She needs pt assistance for eliquis.

## 2021-09-25 NOTE — Telephone Encounter (Signed)
[facility]-CM referral faxed to Jill Simcox 

## 2021-09-27 ENCOUNTER — Other Ambulatory Visit: Payer: Self-pay | Admitting: Family

## 2021-09-27 ENCOUNTER — Other Ambulatory Visit: Payer: Self-pay | Admitting: Internal Medicine

## 2021-10-06 ENCOUNTER — Other Ambulatory Visit: Payer: Self-pay | Admitting: Internal Medicine

## 2021-10-06 DIAGNOSIS — I480 Paroxysmal atrial fibrillation: Secondary | ICD-10-CM

## 2021-10-08 MED ORDER — APIXABAN 5 MG PO TABS: 5.0000 mg | ORAL_TABLET | Freq: Two times a day (BID) | ORAL | 0 refills | Status: DC

## 2021-10-10 ENCOUNTER — Telehealth: Payer: Self-pay | Admitting: Pharmacy Technician

## 2021-10-10 ENCOUNTER — Other Ambulatory Visit: Payer: Self-pay

## 2021-10-10 DIAGNOSIS — Z596 Low income: Secondary | ICD-10-CM

## 2021-10-10 MED ORDER — AMLODIPINE BESYLATE 10 MG PO TABS: 10.0000 mg | ORAL_TABLET | Freq: Every day | ORAL | 0 refills | Status: DC

## 2021-10-10 NOTE — Progress Notes (Signed)
[facility])                                            [facility] Quality Pharmacy Team    10/10/2021  Beth Tyler [birth year] [id number]                                      Medication Assistance Referral  Referral From: [name]   Medication/Company: Eliquis / [employer] Patient application portion:  Mailed Provider application portion: Faxed  to Dr. Dale [place] Provider address/fax verified via: Office website    [name] P. Bertice Risse, CPhT [facility] [employer]  [phone]

## 2021-10-15 ENCOUNTER — Other Ambulatory Visit: Payer: Self-pay | Admitting: Internal Medicine

## 2021-10-15 DIAGNOSIS — I480 Paroxysmal atrial fibrillation: Secondary | ICD-10-CM

## 2021-10-18 ENCOUNTER — Other Ambulatory Visit: Payer: Self-pay | Admitting: Internal Medicine

## 2021-10-30 ENCOUNTER — Telehealth: Payer: Self-pay | Admitting: Pharmacy Technician

## 2021-10-30 DIAGNOSIS — Z596 Low income: Secondary | ICD-10-CM

## 2021-10-30 NOTE — Progress Notes (Signed)
[facility])                                            [facility] Quality Pharmacy Team    10/30/2021  Beth Tyler 1945/05/17 [id number]  Received both patient and provider portion(s) of patient assistance application(s) for Eliquis. Faxed completed application and required documents into [employer].  Gabor Lusk P. Alaric Gladwin, CPhT [facility] [employer]  [phone]

## 2021-11-02 ENCOUNTER — Telehealth: Payer: Self-pay | Admitting: Pharmacy Technician

## 2021-11-02 DIAGNOSIS — Z596 Low income: Secondary | ICD-10-CM

## 2021-11-02 NOTE — Progress Notes (Signed)
[facility])                                            [facility] Quality Pharmacy Team    11/02/2021  Beth Tyler Nov 27, 1945 [id number]  Care coordination call placed to [employer] in regard to Eliquis application.  Spoke to [employer] who informs patient is APPROVED 11/01/21-04/07/22. She informs patient's medication will deliver to her home address.  [name] P. Pharell Rolfson, CPhT [facility] [employer]  [phone]

## 2021-11-15 ENCOUNTER — Other Ambulatory Visit: Payer: Self-pay | Admitting: Internal Medicine

## 2021-11-15 ENCOUNTER — Other Ambulatory Visit: Payer: Self-pay | Admitting: Family

## 2021-12-11 DIAGNOSIS — E7800 Pure hypercholesterolemia, unspecified: Secondary | ICD-10-CM | POA: Diagnosis not present

## 2021-12-11 DIAGNOSIS — R0602 Shortness of breath: Secondary | ICD-10-CM | POA: Diagnosis not present

## 2021-12-11 DIAGNOSIS — I890 Lymphedema, not elsewhere classified: Secondary | ICD-10-CM | POA: Diagnosis not present

## 2021-12-11 DIAGNOSIS — I480 Paroxysmal atrial fibrillation: Secondary | ICD-10-CM | POA: Diagnosis not present

## 2021-12-11 DIAGNOSIS — Z23 Encounter for immunization: Secondary | ICD-10-CM | POA: Diagnosis not present

## 2021-12-11 DIAGNOSIS — I10 Essential (primary) hypertension: Secondary | ICD-10-CM | POA: Diagnosis not present

## 2021-12-20 ENCOUNTER — Other Ambulatory Visit: Payer: Self-pay

## 2021-12-20 MED ORDER — MAGNESIUM OXIDE -MG SUPPLEMENT 400 (240 MG) MG PO TABS: 1.0000 | ORAL_TABLET | Freq: Every day | ORAL | 0 refills | Status: DC

## 2021-12-21 ENCOUNTER — Other Ambulatory Visit: Payer: Self-pay | Admitting: Internal Medicine

## 2021-12-25 ENCOUNTER — Other Ambulatory Visit: Payer: Self-pay | Admitting: Family

## 2021-12-25 ENCOUNTER — Other Ambulatory Visit: Payer: Self-pay | Admitting: Internal Medicine

## 2021-12-25 DIAGNOSIS — E1140 Type 2 diabetes mellitus with diabetic neuropathy, unspecified: Secondary | ICD-10-CM

## 2021-12-26 ENCOUNTER — Other Ambulatory Visit (INDEPENDENT_AMBULATORY_CARE_PROVIDER_SITE_OTHER): Payer: Medicare HMO

## 2021-12-26 DIAGNOSIS — E1140 Type 2 diabetes mellitus with diabetic neuropathy, unspecified: Secondary | ICD-10-CM | POA: Diagnosis not present

## 2021-12-26 DIAGNOSIS — E7800 Pure hypercholesterolemia, unspecified: Secondary | ICD-10-CM | POA: Diagnosis not present

## 2021-12-26 DIAGNOSIS — Z794 Long term (current) use of insulin: Secondary | ICD-10-CM | POA: Diagnosis not present

## 2021-12-26 DIAGNOSIS — E1122 Type 2 diabetes mellitus with diabetic chronic kidney disease: Secondary | ICD-10-CM | POA: Diagnosis not present

## 2021-12-26 DIAGNOSIS — N183 Chronic kidney disease, stage 3 unspecified: Secondary | ICD-10-CM | POA: Diagnosis not present

## 2021-12-26 LAB — HEPATIC FUNCTION PANEL
ALT: 20 U/L (ref 0–35)
AST: 17 U/L (ref 0–37)
Albumin: 4.3 g/dL (ref 3.5–5.2)
Alkaline Phosphatase: 85 U/L (ref 39–117)
Bilirubin, Direct: 0.1 mg/dL (ref 0.0–0.3)
Total Bilirubin: 0.6 mg/dL (ref 0.2–1.2)
Total Protein: 6.8 g/dL (ref 6.0–8.3)

## 2021-12-26 LAB — BASIC METABOLIC PANEL
BUN: 33 mg/dL — ABNORMAL HIGH (ref 6–23)
CO2: 27 mEq/L (ref 19–32)
Calcium: 9.4 mg/dL (ref 8.4–10.5)
Chloride: 105 mEq/L (ref 96–112)
Creatinine, Ser: 1.1 mg/dL (ref 0.40–1.20)
GFR: 48.55 mL/min — ABNORMAL LOW (ref >60.00)
Glucose, Bld: 126 mg/dL — ABNORMAL HIGH (ref 70–99)
Potassium: 4.8 mEq/L (ref 3.5–5.1)
Sodium: 141 mEq/L (ref 135–145)

## 2021-12-26 LAB — LIPID PANEL
Cholesterol: 125 mg/dL (ref 0–200)
HDL: 54 mg/dL (ref >39.00)
LDL Cholesterol: 52 mg/dL (ref 0–99)
NonHDL: 70.99
Total CHOL/HDL Ratio: 2
Triglycerides: 97 mg/dL (ref 0.0–149.0)
VLDL: 19.4 mg/dL (ref 0.0–40.0)

## 2021-12-26 LAB — HEMOGLOBIN A1C: Hgb A1c MFr Bld: 7.7 % — ABNORMAL HIGH (ref 4.6–6.5)

## 2021-12-27 ENCOUNTER — Other Ambulatory Visit: Payer: Self-pay | Admitting: Internal Medicine

## 2021-12-27 DIAGNOSIS — E1140 Type 2 diabetes mellitus with diabetic neuropathy, unspecified: Secondary | ICD-10-CM

## 2021-12-27 NOTE — Telephone Encounter (Signed)
Refilled by Judson Roch CMA on 12/26/21

## 2021-12-28 ENCOUNTER — Ambulatory Visit (INDEPENDENT_AMBULATORY_CARE_PROVIDER_SITE_OTHER): Payer: Medicare HMO | Admitting: Internal Medicine

## 2021-12-28 ENCOUNTER — Encounter: Payer: Self-pay | Admitting: Internal Medicine

## 2021-12-28 VITALS — BP 138/76 | HR 60 | Temp 98.0°F | Ht 62.0 in | Wt 195.6 lb

## 2021-12-28 DIAGNOSIS — I10 Essential (primary) hypertension: Secondary | ICD-10-CM

## 2021-12-28 DIAGNOSIS — E7800 Pure hypercholesterolemia, unspecified: Secondary | ICD-10-CM | POA: Diagnosis not present

## 2021-12-28 DIAGNOSIS — D6859 Other primary thrombophilia: Secondary | ICD-10-CM | POA: Diagnosis not present

## 2021-12-28 DIAGNOSIS — E1122 Type 2 diabetes mellitus with diabetic chronic kidney disease: Secondary | ICD-10-CM

## 2021-12-28 DIAGNOSIS — Z794 Long term (current) use of insulin: Secondary | ICD-10-CM

## 2021-12-28 DIAGNOSIS — E1140 Type 2 diabetes mellitus with diabetic neuropathy, unspecified: Secondary | ICD-10-CM | POA: Diagnosis not present

## 2021-12-28 DIAGNOSIS — N183 Chronic kidney disease, stage 3 unspecified: Secondary | ICD-10-CM | POA: Diagnosis not present

## 2021-12-28 DIAGNOSIS — I480 Paroxysmal atrial fibrillation: Secondary | ICD-10-CM | POA: Diagnosis not present

## 2021-12-28 DIAGNOSIS — Z23 Encounter for immunization: Secondary | ICD-10-CM

## 2021-12-28 DIAGNOSIS — I700 Atherosclerosis of aorta: Secondary | ICD-10-CM | POA: Diagnosis not present

## 2021-12-28 LAB — MICROALBUMIN / CREATININE URINE RATIO
Creatinine,U: 72.2 mg/dL
Microalb Creat Ratio: 4 mg/g (ref 0.0–30.0)
Microalb, Ur: 2.9 mg/dL — ABNORMAL HIGH (ref 0.0–1.9)

## 2021-12-28 MED ORDER — BD PEN NEEDLE NANO 2ND GEN 32G X 4 MM MISC: 12 refills | Status: DC

## 2021-12-28 MED ORDER — TRIAMCINOLONE ACETONIDE 0.1 % EX CREA: TOPICAL_CREAM | CUTANEOUS | 0 refills | Status: DC

## 2021-12-28 MED ORDER — FLUTICASONE PROPIONATE 0.05 % EX CREA: 1.0000 | TOPICAL_CREAM | CUTANEOUS | 0 refills | Status: AC | PRN

## 2021-12-28 NOTE — Progress Notes (Unsigned)
Patient ID: Beth Tyler, female   DOB: 1945-04-20, 76 y.o.   MRN: [id number]   Subjective:    Patient ID: Beth Tyler, female    DOB: April 13, 1945, 76 y.o.   MRN: [id number]   Patient here for  Chief Complaint  Patient presents with   Follow-up    Follow up   .   HPI Here to follow up regarding hypertension, diabetes and hypercholesterolemia.  She is doing well.  Feels better.  Feels her heart is doing better.  Just saw cardiology.  No changes made.  Still will notice occasional "flip flopping", but not as intense and not as frequent of episodes.  No acid reflux reported.  No abdominal pain.  Bowels stable. No change.     Past Medical History:  Diagnosis Date   Anemia    Arthritis    B12 deficiency    Carpal tunnel syndrome, bilateral    COVID-19 03/18/2019   Diabetes mellitus ([place]) 02/17/2012   Diabetes mellitus, type II (HCC)    Dyspnea    Gastritis    GERD (gastroesophageal reflux disease)    Hypercholesterolemia    Hypertension    IBS (irritable bowel syndrome)    Neuropathy    legs   Pernicious anemia    Vertigo    no episodes fo 9 or 10 yrs   Past Surgical History:  Procedure Laterality Date   ANTERIOR VITRECTOMY Right 12/01/2017   Procedure: ANTERIOR VITRECTOMY;  Surgeon: Eulogio Bear, MD;  Location: [facility];  Service: Ophthalmology;  Laterality: Right;   BREAST BIOPSY Right 1980   neg   BREAST EXCISIONAL BIOPSY Right 1980   CATARACT EXTRACTION W/PHACO Left 10/13/2017   Procedure: CATARACT EXTRACTION PHACO AND INTRAOCULAR LENS PLACEMENT ([place])  LEFT  DIABETES;  Surgeon: Eulogio Bear, MD;  Location: [facility];  Service: Ophthalmology;  Laterality: Left;  Diabetic - insulin and oral meds   CATARACT EXTRACTION W/PHACO Right 12/01/2017   Procedure: CATARACT EXTRACTION PHACO AND INTRAOCULAR LENS PLACEMENT ([place])  RIGHT DIABETIC;  Surgeon: Eulogio Bear, MD;  Location: [facility];  Service: Ophthalmology;   Laterality: Right;  Diabetic - insulin and oral   CHOLECYSTECTOMY  1983   Family History  Problem Relation Age of Onset   Diabetes Father    Kidney disease Father    Heart disease Father    Alcohol abuse Mother    Dementia Mother    Diabetes Mother    Diabetes Other    Hemophilia Brother    Uterine cancer Sister    Diabetes Sister    COPD Sister    Breast cancer Maternal Aunt 32   Breast cancer Daughter 55   Stroke Daughter    Diabetes Son    Social History   Socioeconomic History   Marital status: Widowed    Spouse name: Not on file   Number of children: 4   Years of education: Not on file   Highest education level: Not on file  Occupational History   Not on file  Tobacco Use   Smoking status: Never   Smokeless tobacco: Never  Vaping Use   Vaping Use: Never used  Substance and Sexual Activity   Alcohol use: No    Alcohol/week: 0.0 standard drinks of alcohol   Drug use: No   Sexual activity: Not Currently  Other Topics Concern   Not on file  Social History Narrative   Widowed. Has four children. Manager of an apartment building. No  tobacco, alcohol or other drug use   Social Determinants of Health   Financial Resource Strain: Medium Risk (05/02/2021)   Overall Financial Resource Strain (CARDIA)    Difficulty of Paying Living Expenses: Somewhat hard  Food Insecurity: No Food Insecurity (07/30/2021)   Hunger Vital Sign    Worried About Running Out of Food in the Last Year: Never true    Ran Out of Food in the Last Year: Never true  Transportation Needs: No Transportation Needs (07/30/2021)   PRAPARE - Hydrologist (Medical): No    Lack of Transportation (Non-Medical): No  Physical Activity: Not on file  Stress: No Stress Concern Present (07/30/2021)   [place]    Feeling of Stress : Not at all  Social Connections: Moderately Integrated (07/30/2021)   Social  Connection and Isolation Panel [NHANES]    Frequency of Communication with Friends and Family: More than three times a week    Frequency of Social Gatherings with Friends and Family: More than three times a week    Attends Religious Services: 1 to 4 times per year    Active Member of [employer] or Organizations: Yes    Attends Archivist Meetings: 1 to 4 times per year    Marital Status: Widowed     Review of Systems  Constitutional:  Negative for appetite change and unexpected weight change.  HENT:  Negative for congestion and sinus pressure.   Respiratory:  Negative for cough, chest tightness and shortness of breath.   Cardiovascular:  Negative for chest pain and palpitations.       Lower extremity swelling - improved.    Gastrointestinal:  Negative for abdominal pain, nausea and vomiting.  Genitourinary:  Negative for difficulty urinating and dysuria.  Musculoskeletal:  Negative for joint swelling and myalgias.  Skin:  Negative for color change and rash.  Neurological:  Negative for dizziness, light-headedness and headaches.  Psychiatric/Behavioral:  Negative for agitation and dysphoric mood.        Objective:     BP 138/76 (BP Location: Left Arm, Patient Position: Sitting, Cuff Size: Normal)   Pulse 60   Temp 98 F (36.7 C) (Oral)   Ht 5' 2" (1.575 m)   Wt 195 lb 9.6 oz (88.7 kg)   SpO2 93%   BMI 35.78 kg/m  Wt Readings from Last 3 Encounters:  12/28/21 195 lb 9.6 oz (88.7 kg)  09/21/21 200 lb (90.7 kg)  08/14/21 197 lb (89.4 kg)    Physical Exam Vitals reviewed.  Constitutional:      General: She is not in acute distress.    Appearance: Normal appearance.  HENT:     Head: Normocephalic and atraumatic.     Right Ear: External ear normal.     Left Ear: External ear normal.  Eyes:     General: No scleral icterus.       Right eye: No discharge.        Left eye: No discharge.     Conjunctiva/sclera: Conjunctivae normal.  Neck:     Thyroid: No  thyromegaly.  Cardiovascular:     Rate and Rhythm: Normal rate.     Comments: Rated controlled.  Pulmonary:     Effort: No respiratory distress.     Breath sounds: Normal breath sounds. No wheezing.  Abdominal:     General: Bowel sounds are normal.     Palpations: Abdomen is soft.  Tenderness: There is no abdominal tenderness.  Musculoskeletal:        General: No tenderness.     Cervical back: Neck supple. No tenderness.     Comments: No increased swelling.  (Lower extremity swelling improved).   Lymphadenopathy:     Cervical: No cervical adenopathy.  Skin:    Findings: No erythema or rash.  Neurological:     Mental Status: She is alert.  Psychiatric:        Mood and Affect: Mood normal.        Behavior: Behavior normal.      Outpatient Encounter Medications as of 12/28/2021  Medication Sig   acetaminophen (TYLENOL) 500 MG tablet Take 500-1,000 mg by mouth every 6 (six) hours as needed for mild pain or moderate pain.    albuterol (VENTOLIN HFA) 108 (90 Base) MCG/ACT inhaler Inhale 2 puffs into the lungs every 4 (four) hours as needed for wheezing or shortness of breath.   amLODipine (NORVASC) 10 MG tablet Take 1 tablet (10 mg total) by mouth daily.   benzonatate (TESSALON) 100 MG capsule Take 2 capsules (200 mg total) by mouth every 8 (eight) hours.   Continuous Blood Gluc Sensor (FREESTYLE LIBRE 2 SENSOR) MISC USE TO CHECK BLOOD SUGARS AT LEAST 4 TIMES DAILY   ELIQUIS 5 MG TABS tablet Take 1 tablet by mouth twice daily   empagliflozin ([name]) 25 MG TABS tablet Take 1 tablet (25 mg total) by mouth daily before breakfast.   ferrous sulfate 325 (65 FE) MG tablet Take 325 mg by mouth daily with breakfast.   glucose blood (ONETOUCH VERIO) test strip USE AS DIRECTED   Insulin Glargine (BASAGLAR KWIKPEN) 100 UNIT/ML Inject 22 Units into the skin every morning.   insulin lispro ([name] KWIKPEN) 100 UNIT/ML KwikPen Inject 8-14 Units into the skin 3 (three) times daily.    ipratropium (ATROVENT) 0.06 % nasal spray Place 2 sprays into both nostrils 4 (four) times daily.   lisinopril (ZESTRIL) 20 MG tablet Take 1 tablet by mouth once daily   magnesium oxide (MAG-OX) 400 (240 Mg) MG tablet Take 1 tablet (400 mg total) by mouth daily.   metFORMIN (GLUCOPHAGE-XR) 500 MG 24 hr tablet Take 2 tablets by mouth twice daily   metoprolol tartrate (LOPRESSOR) 25 MG tablet Take 25 mg by mouth 2 (two) times daily.   Multiple Vitamin (MULTIVITAMIN) tablet Take 1 tablet by mouth daily.   mupirocin ointment (BACTROBAN) 2 % Apply 1 application. topically 2 (two) times daily.   Pancrelipase, Lip-Prot-Amyl, ([name] PO) Take by mouth. Take 36,000-72,000 Units by mouth See admin instructions. Take 3 capsules by mouth three times daily with meals and take 2 capsule by mouth daily with snacks   pantoprazole (PROTONIX) 40 MG tablet TAKE 1 TABLET BY MOUTH TWICE DAILY BEFORE MEAL(S)   simvastatin (ZOCOR) 10 MG tablet TAKE 1 TABLET BY MOUTH AT BEDTIME   vitamin B-12 (CYANOCOBALAMIN) 100 MCG tablet Take 100 mcg by mouth daily.   [DISCONTINUED] fluticasone (CUTIVATE) 0.05 % cream Apply 1 Application topically as needed. Use as directed.   [DISCONTINUED] Insulin Pen Needle (BD PEN NEEDLE NANO 2ND GEN) 32G X 4 MM MISC USE TO INJECT INSULIN 4 TIMES DAILY   [DISCONTINUED] triamcinolone cream (KENALOG) 0.1 % APPLY  CREAM EXTERNALLY TWICE DAILY   fluticasone (CUTIVATE) 0.05 % cream Apply 1 Application topically as needed. Use as directed.   Insulin Pen Needle (BD PEN NEEDLE NANO 2ND GEN) 32G X 4 MM MISC USE TO INJECT INSULIN 4  TIMES DAILY   triamcinolone cream (KENALOG) 0.1 % APPLY  CREAM EXTERNALLY TWICE DAILY   No facility-administered encounter medications on file as of 12/28/2021.     Lab Results  Component Value Date   WBC 9.3 07/10/2021   HGB 13.4 07/10/2021   HCT 40.9 07/10/2021   PLT 285.0 07/10/2021   GLUCOSE 126 (H) 12/26/2021   CHOL 125 12/26/2021   TRIG 97.0 12/26/2021   HDL 54.00  12/26/2021   LDLDIRECT 74.4 02/21/2012   LDLCALC 52 12/26/2021   ALT 20 12/26/2021   AST 17 12/26/2021   NA 141 12/26/2021   K 4.8 12/26/2021   CL 105 12/26/2021   CREATININE 1.10 12/26/2021   BUN 33 (H) 12/26/2021   CO2 27 12/26/2021   TSH 1.88 06/19/2021   INR 1.2 07/04/2021   HGBA1C 7.7 (H) 12/26/2021   MICROALBUR 2.9 (H) 12/28/2021    MM 3D SCREEN BREAST BILATERAL  Result Date: 08/23/2021 CLINICAL DATA:  Screening. EXAM: DIGITAL SCREENING BILATERAL MAMMOGRAM WITH TOMOSYNTHESIS AND CAD TECHNIQUE: Bilateral screening digital craniocaudal and mediolateral oblique mammograms were obtained. Bilateral screening digital breast tomosynthesis was performed. The images were evaluated with computer-aided detection. COMPARISON:  Previous exam(s). ACR Breast Density Category b: There are scattered areas of fibroglandular density. FINDINGS: There are no findings suspicious for malignancy. IMPRESSION: No mammographic evidence of malignancy. A result letter of this screening mammogram will be mailed directly to the patient. RECOMMENDATION: Screening mammogram in one year. (Code:SM-B-01Y) BI-RADS CATEGORY  1: Negative. Electronically Signed   By: Lajean Manes M.D.   On: 08/23/2021 15:21       Assessment & Plan:   Problem List Items Addressed This Visit     Aortic atherosclerosis ([place])    Continue simvastatin.       Benign essential hypertension    Continue metoprolol and amlodipine.  Blood pressure as outlined. On lisinopril 30m q day.  Blood pressure here as outlined.  136/68 on my check.  Hold on changing medication. Follow pressures.  Follow metabolic panel.       Hypercholesterolemia    Continue simvastatin.  Low cholesterol diet and exercise.  Follow lipid panel and liver function tests.       Relevant Orders   Hepatic function panel   Lipid panel   Paroxysmal A-fib (HCC)    Continue metoprolol and eliquis.  Follow.  Feels better.  Feels her heart is doing better.  Just saw  cardiology.  No changes made.  Still will notice occasional "flip flopping", but not as intense and not as frequent of episodes.        [place]    afib on eliquis.       Type 2 diabetes mellitus with diabetic chronic kidney disease [place]    On jardiance.  Also basaglar and humalog as outlined.  Discussed diet and exercise.  She has adjusted and has been watching diet.  Sugars trending down.  Has had issues with low sugars previously.  Avoid GLP 1 agonist given persistent GI issues.  Follow met b and a1c.       Other Visit Diagnoses     Type 2 diabetes mellitus with diabetic neuropathy, with long-term current use of insulin ([place]    -  Primary   Relevant Orders   Microalbumin/Creatinine Ratio, Urine (Completed)   Basic metabolic panel   Hemoglobin A1c   Need for immunization against influenza       Relevant Orders   Flu Vaccine QUAD High Dose(Fluad) (Completed)  Charlene Scott, MD  

## 2021-12-31 ENCOUNTER — Encounter: Payer: Self-pay | Admitting: Internal Medicine

## 2021-12-31 NOTE — Assessment & Plan Note (Signed)
afib on eliquis.  

## 2021-12-31 NOTE — Assessment & Plan Note (Signed)
Continue metoprolol and amlodipine.  Blood pressure as outlined. On lisinopril 20mg  q day.  Blood pressure here as outlined.  136/68 on my check.  Hold on changing medication. Follow pressures.  Follow metabolic panel.

## 2021-12-31 NOTE — Assessment & Plan Note (Signed)
On jardiance.  Also basaglar and humalog as outlined.  Discussed diet and exercise.  She has adjusted and has been watching diet.  Sugars trending down.  Has had issues with low sugars previously.  Avoid GLP 1 agonist given persistent GI issues.  Follow met b and a1c.

## 2021-12-31 NOTE — Assessment & Plan Note (Signed)
Continue simvastatin.  Low cholesterol diet and exercise.  Follow lipid panel and liver function tests.   

## 2021-12-31 NOTE — Assessment & Plan Note (Signed)
Continue simvastatin. 

## 2021-12-31 NOTE — Assessment & Plan Note (Signed)
Continue metoprolol and eliquis.  Follow.  Feels better.  Feels her heart is doing better.  Just saw cardiology.  No changes made.  Still will notice occasional "flip flopping", but not as intense and not as frequent of episodes.

## 2022-01-07 ENCOUNTER — Other Ambulatory Visit: Payer: Self-pay | Admitting: Internal Medicine

## 2022-01-08 MED ORDER — SIMVASTATIN 10 MG PO TABS: 10.0000 mg | ORAL_TABLET | Freq: Every day | ORAL | 0 refills | Status: DC

## 2022-01-13 ENCOUNTER — Other Ambulatory Visit: Payer: Self-pay | Admitting: Internal Medicine

## 2022-01-14 ENCOUNTER — Other Ambulatory Visit: Payer: Self-pay | Admitting: Internal Medicine

## 2022-01-14 MED ORDER — LISINOPRIL 20 MG PO TABS: 20.0000 mg | ORAL_TABLET | Freq: Every day | ORAL | 1 refills | Status: DC

## 2022-02-05 ENCOUNTER — Telehealth: Payer: Self-pay | Admitting: Pharmacy Technician

## 2022-02-05 DIAGNOSIS — Z596 Low income: Secondary | ICD-10-CM

## 2022-02-05 NOTE — Progress Notes (Signed)
[facility])                                            [place] Team    02/05/2022  Beth Tyler 07-Jun-1945 [id number]                                      Medication Assistance Referral-FOR 2024 RE ENROLLMENT  Referral From: [place]   Medication/Company: [name] / [name] Patient application portion:  Mailed Provider application portion: Faxed  to Dr. Einar Pheasant Provider address/fax verified via: Office website  Medication/Company: Vania Rea / [place] Patient application portion:  Mailed Provider application portion: Faxed  to Dr. Einar Pheasant Provider address/fax verified via: Office website  Medication/Company: [name] / [name] Patient application portion:  Mailed Provider application portion: Faxed  to Dr. Einar Pheasant Provider address/fax verified via: Office website  Medication/Company: [employer] Patient application portion:  Mailed Provider application portion: Faxed  to Dr. Einar Pheasant Provider address/fax verified via: Office website     Beth Tyler P. Beth Tyler, [place]  [phone]

## 2022-02-15 DIAGNOSIS — Z961 Presence of intraocular lens: Secondary | ICD-10-CM | POA: Diagnosis not present

## 2022-02-15 LAB — HM DIABETES EYE EXAM

## 2022-02-25 ENCOUNTER — Ambulatory Visit (INDEPENDENT_AMBULATORY_CARE_PROVIDER_SITE_OTHER): Payer: Medicare HMO

## 2022-02-25 ENCOUNTER — Encounter: Payer: Self-pay | Admitting: Emergency Medicine

## 2022-02-25 ENCOUNTER — Ambulatory Visit
Admission: EM | Admit: 2022-02-25 | Discharge: 2022-02-25 | Disposition: A | Discharge status: Home / Self Care | Payer: Medicare HMO | Attending: Physician Assistant | Admitting: Physician Assistant

## 2022-02-25 DIAGNOSIS — R0602 Shortness of breath: Secondary | ICD-10-CM | POA: Diagnosis not present

## 2022-02-25 DIAGNOSIS — J22 Unspecified acute lower respiratory infection: Secondary | ICD-10-CM

## 2022-02-25 DIAGNOSIS — R051 Acute cough: Secondary | ICD-10-CM | POA: Diagnosis not present

## 2022-02-25 DIAGNOSIS — R059 Cough, unspecified: Secondary | ICD-10-CM | POA: Diagnosis not present

## 2022-02-25 MED ORDER — ALBUTEROL SULFATE HFA 108 (90 BASE) MCG/ACT IN AERS: 1.0000 | INHALATION_SPRAY | Freq: Four times a day (QID) | RESPIRATORY_TRACT | 0 refills | Status: AC | PRN

## 2022-02-25 MED ORDER — AZITHROMYCIN 250 MG PO TABS: 250.0000 mg | ORAL_TABLET | Freq: Every day | ORAL | 0 refills | Status: DC

## 2022-02-25 MED ORDER — BENZONATATE 200 MG PO CAPS: 200.0000 mg | ORAL_CAPSULE | Freq: Three times a day (TID) | ORAL | 0 refills | Status: DC | PRN

## 2022-02-25 NOTE — ED Provider Notes (Signed)
[facility]    CSN: [id number] Arrival date & time: 02/25/22  1607      History   Chief Complaint Chief Complaint  Patient presents with   Cough    HPI Beth Tyler is a 76 y.o. female presenting for 1.5 week history of cough, congestion.  Patient reports that today she started to experience pain between her shoulder blades and have shortness of breath as well as a low-grade temperature of 99.9 degrees.  She says she became concerned for possible pneumonia at that time.  She denies any sinus pain or pressure, postnasal drainage.  No chest pain, abdominal pain, vomiting or diarrhea.  Has not been taking any cough medication over-the-counter.  Reports a lot of medical problems and says she tries to avoid taking any over-the-counter medications.  Medical history significant for type 2 diabetes, GERD, hypertension, hyperlipidemia, anemia.  Also reports a history of A-fib and states she takes anticoagulants.  No story of asthma, COPD or any respiratory illnesses.  Reports a significant case of COVID-19 in 2020 that left her ill for quite a long time.  No other complaints.  HPI  Past Medical History:  Diagnosis Date   Anemia    Arthritis    B12 deficiency    Carpal tunnel syndrome, bilateral    COVID-19 03/18/2019   Diabetes mellitus (HCC) 02/17/2012   Diabetes mellitus, type II (HCC)    Dyspnea    Gastritis    GERD (gastroesophageal reflux disease)    Hypercholesterolemia    Hypertension    IBS (irritable bowel syndrome)    Neuropathy    legs   Pernicious anemia    Vertigo    no episodes fo 9 or 10 yrs    Patient Active Problem List   Diagnosis Date Noted   Hypoglycemia 09/22/2021   Right upper lobe pneumonia 07/14/2021   Itching 07/10/2021   Throat clearing 06/24/2021   Decreased GFR 06/21/2021   Thrombophilia (HCC) 08/27/2020   Healthcare maintenance 08/23/2020   Breast cancer screening 07/09/2020   Hypercholesterolemia 08/07/2019   Change in bowel  movement 08/07/2019   Benign essential hypertension 04/06/2019   Type 2 diabetes mellitus with diabetic chronic kidney disease (HCC) 04/06/2019   Paroxysmal A-fib (HCC) 04/06/2019   Aortic atherosclerosis (HCC) 10/22/2016   Back pain 05/23/2015   Cough 02/13/2015   Colon cancer screening 01/19/2014    Past Surgical History:  Procedure Laterality Date   ANTERIOR VITRECTOMY Right 12/01/2017   Procedure: ANTERIOR VITRECTOMY;  Surgeon: Nevada Crane, MD;  Location: [facility];  Service: Ophthalmology;  Laterality: Right;   BREAST BIOPSY Right 1980   neg   BREAST EXCISIONAL BIOPSY Right 1980   CATARACT EXTRACTION W/PHACO Left 10/13/2017   Procedure: CATARACT EXTRACTION PHACO AND INTRAOCULAR LENS PLACEMENT (IOC)  LEFT  DIABETES;  Surgeon: Nevada Crane, MD;  Location: [facility];  Service: Ophthalmology;  Laterality: Left;  Diabetic - insulin and oral meds   CATARACT EXTRACTION W/PHACO Right 12/01/2017   Procedure: CATARACT EXTRACTION PHACO AND INTRAOCULAR LENS PLACEMENT (IOC)  RIGHT DIABETIC;  Surgeon: Nevada Crane, MD;  Location: [facility];  Service: Ophthalmology;  Laterality: Right;  Diabetic - insulin and oral   CHOLECYSTECTOMY  1983    OB History   No obstetric history on file.      Home Medications    Prior to Admission medications   Medication Sig Start Date End Date Taking? Authorizing Provider  albuterol (VENTOLIN HFA) 108 (90  Base) MCG/ACT inhaler Inhale 2 puffs into the lungs every 4 (four) hours as needed for wheezing or shortness of breath. 04/16/21  Yes Valinda HoarWhite, Adrienne R, NP  amLODipine (NORVASC) 10 MG tablet Take 1 tablet by mouth once daily 01/14/22  Yes Dale DurhamScott, Charlene, MD  azithromycin (ZITHROMAX) 250 MG tablet Take 1 tablet (250 mg total) by mouth daily. Take first 2 tablets together, then 1 every day until finished. 02/25/22  Yes Shirlee LatchEaves, Jeorgia Helming B, PA-C  empagliflozin ([name]) 25 MG TABS tablet Take 1 tablet (25 mg total)  by mouth daily before breakfast. 12/14/19  Yes Dale DurhamScott, Charlene, MD  ferrous sulfate 325 (65 FE) MG tablet Take 325 mg by mouth daily with breakfast.   Yes [provider]  fluticasone (CUTIVATE) 0.05 % cream Apply 1 Application topically as needed. Use as directed. 12/28/21  Yes Dale DurhamScott, Charlene, MD  Insulin Glargine (BASAGLAR KWIKPEN) 100 UNIT/ML Inject 22 Units into the skin every morning. 03/14/21  Yes Dale DurhamScott, Charlene, MD  insulin lispro ([name] KWIKPEN) 100 UNIT/ML KwikPen Inject 8-14 Units into the skin 3 (three) times daily. 03/14/21  Yes Dale DurhamScott, Charlene, MD  lisinopril (ZESTRIL) 20 MG tablet Take 1 tablet (20 mg total) by mouth daily. 01/14/22  Yes Dale DurhamScott, Charlene, MD  magnesium oxide (MAG-OX) 400 (240 Mg) MG tablet Take 1 tablet (400 mg total) by mouth daily. 12/20/21  Yes Dale DurhamScott, Charlene, MD  metFORMIN (GLUCOPHAGE-XR) 500 MG 24 hr tablet Take 2 tablets by mouth twice daily 12/26/21  Yes Dale DurhamScott, Charlene, MD  metoprolol tartrate (LOPRESSOR) 25 MG tablet Take 25 mg by mouth 2 (two) times daily.   Yes [provider]  Multiple Vitamin (MULTIVITAMIN) tablet Take 1 tablet by mouth daily.   Yes [provider]  Pancrelipase, Lip-Prot-Amyl, ([name] PO) Take by mouth. Take 36,000-72,000 Units by mouth See admin instructions. Take 3 capsules by mouth three times daily with meals and take 2 capsule by mouth daily with snacks   Yes [provider]  pantoprazole (PROTONIX) 40 MG tablet TAKE 1 TABLET BY MOUTH TWICE DAILY BEFORE MEAL(S) 10/19/21  Yes Dale DurhamScott, Charlene, MD  simvastatin (ZOCOR) 10 MG tablet Take 1 tablet (10 mg total) by mouth at bedtime. 01/08/22  Yes Dale DurhamScott, Charlene, MD  vitamin B-12 (CYANOCOBALAMIN) 100 MCG tablet Take 100 mcg by mouth daily.   Yes [provider]  acetaminophen (TYLENOL) 500 MG tablet Take 500-1,000 mg by mouth every 6 (six) hours as needed for mild pain or moderate pain.     [provider]  benzonatate (TESSALON) 200 MG capsule  Take 1 capsule (200 mg total) by mouth 3 (three) times daily as needed for cough. 02/25/22   Eusebio FriendlyEaves, Oral Remache B, PA-C  Continuous Blood Gluc Sensor (FREESTYLE LIBRE 2 SENSOR) MISC USE TO CHECK BLOOD SUGARS AT LEAST 4 TIMES DAILY 09/10/21   [provider]  ELIQUIS 5 MG TABS tablet Take 1 tablet by mouth twice daily 10/16/21   Dale DurhamScott, Charlene, MD  Insulin Pen Needle (BD PEN NEEDLE NANO 2ND GEN) 32G X 4 MM MISC USE TO INJECT INSULIN 4 TIMES DAILY 12/28/21   Dale DurhamScott, Charlene, MD  ipratropium (ATROVENT) 0.06 % nasal spray Place 2 sprays into both nostrils 4 (four) times daily. 04/12/21   Becky Augustayan, Jeremy, NP  mupirocin ointment (BACTROBAN) 2 % Apply 1 application. topically 2 (two) times daily. 06/21/21   Dale DurhamScott, Charlene, MD  [facility] Inc[facility]CH VERIO test strip USE AS DIRECTED 01/08/22   Dale DurhamScott, Charlene, MD  triamcinolone cream (KENALOG) 0.1 % APPLY  CREAM  EXTERNALLY TWICE DAILY 12/28/21   Dale Norwalk, MD    Family History Family History  Problem Relation Age of Onset   Diabetes Father    Kidney disease Father    Heart disease Father    Alcohol abuse Mother    Dementia Mother    Diabetes Mother    Diabetes Other    Hemophilia Brother    Uterine cancer Sister    Diabetes Sister    COPD Sister    Breast cancer Maternal Aunt 19   Breast cancer Daughter 1   Stroke Daughter    Diabetes Son     Social History Social History   Tobacco Use   Smoking status: Never   Smokeless tobacco: Never  Vaping Use   Vaping Use: Never used  Substance Use Topics   Alcohol use: No    Alcohol/week: 0.0 standard drinks of alcohol   Drug use: No     Allergies   Hydrocodone, Hydrocodone-acetaminophen, Oxycodone-acetaminophen, Percocet [oxycodone-acetaminophen], Demeclocycline, Other, Penicillins, and Tetracyclines & related   Review of Systems Review of Systems  Constitutional:  Positive for fatigue and fever. Negative for chills and diaphoresis.  HENT:  Positive for congestion and rhinorrhea. Negative for  ear pain, sinus pressure, sinus pain and sore throat.   Respiratory:  Positive for cough and shortness of breath. Negative for wheezing.   Cardiovascular:  Negative for chest pain.  Gastrointestinal:  Negative for abdominal pain, nausea and vomiting.  Musculoskeletal:  Positive for back pain. Negative for arthralgias and myalgias.  Skin:  Negative for rash.  Neurological:  Negative for weakness and headaches.  Hematological:  Negative for adenopathy.     Physical Exam Triage Vital Signs ED Triage Vitals  Enc Vitals Group     BP 02/25/22 1808 (!) 171/55     Pulse Rate 02/25/22 1808 65     Resp 02/25/22 1808 16     Temp 02/25/22 1808 98.2 F (36.8 C)     Temp Source 02/25/22 1808 Oral     SpO2 02/25/22 1808 96 %     Weight 02/25/22 1805 195 lb 8.8 oz (88.7 kg)     Height 02/25/22 1805 5\' 2"  (1.575 m)     Head Circumference --      Peak Flow --      Pain Score 02/25/22 1805 2     Pain Loc --      Pain Edu? --      Excl. in GC? --    No data found.  Updated Vital Signs BP (!) 171/55 (BP Location: Right Arm)   Pulse 65   Temp 98.2 F (36.8 C) (Oral)   Resp 16   Ht 5\' 2"  (1.575 m)   Wt 195 lb 8.8 oz (88.7 kg)   SpO2 96%   BMI 35.77 kg/m      Physical Exam Vitals and nursing note reviewed.  Constitutional:      General: She is not in acute distress.    Appearance: Normal appearance. She is ill-appearing. She is not toxic-appearing.  HENT:     Head: Normocephalic and atraumatic.     Right Ear: Tympanic membrane, ear canal and external ear normal.     Left Ear: Tympanic membrane, ear canal and external ear normal.     Nose: Congestion (minimal) present.     Mouth/Throat:     Mouth: Mucous membranes are moist.     Pharynx: Oropharynx is clear.  Eyes:     General: No scleral icterus.  Right eye: No discharge.        Left eye: No discharge.     Conjunctiva/sclera: Conjunctivae normal.  Cardiovascular:     Rate and Rhythm: Normal rate and regular rhythm.      Heart sounds: Normal heart sounds.  Pulmonary:     Effort: Pulmonary effort is normal. No respiratory distress.     Breath sounds: Rales (RLL) present.  Musculoskeletal:     Cervical back: Neck supple.  Skin:    General: Skin is dry.  Neurological:     General: No focal deficit present.     Mental Status: She is alert. Mental status is at baseline.     Motor: No weakness.     Gait: Gait normal.  Psychiatric:        Mood and Affect: Mood normal.        Behavior: Behavior normal.        Thought Content: Thought content normal.      UC Treatments / Results  Labs (all labs ordered are listed, but only abnormal results are displayed) Labs Reviewed - No data to display  EKG   Radiology DG Chest 2 View  Result Date: 02/25/2022 CLINICAL DATA:  Cough, congestion, and shortness of breath. EXAM: CHEST - 2 VIEW COMPARISON:  Chest two views 08/14/2021 FINDINGS: Cardiac silhouette and mediastinal contours are within normal limits. The lungs are clear. No pleural effusion or pneumothorax. Moderate multilevel disc space narrowing and anterior bridging osteophytes of the thoracic spine. Cholecystectomy clips. IMPRESSION: No active cardiopulmonary disease. Electronically Signed   By: Neita Garnet M.D.   On: 02/25/2022 18:43    Procedures Procedures (including critical care time)  Medications Ordered in UC Medications - No data to display  Initial Impression / Assessment and Plan / UC Course  I have reviewed the triage vital signs and the nursing notes.  Pertinent labs & imaging results that were available during my care of the patient were reviewed by me and considered in my medical decision making (see chart for details).   76 year old female presents for cough and congestion x1 and half weeks.  Reports cough is productive of discolored sputum.  She says she was feeling better until today when she started to have upper back pain, shortness of breath and increased fatigue.  Low-grade  temperature of 99.9 degrees.  No sinus pain or pressure.  She is presently afebrile.  States she took Tylenol earlier.  She is mildly ill-appearing but nontoxic.  On exam she has minimal nasal congestion.  No respiratory distress.  Rales of the right lower lobe.  Chest x-ray performed today is read by the radiologist as within normal limits.  Given her clinical presentation, I am still concerned for the possibility of community-acquired pneumonia.  She has allergy to penicillin and doxycycline.  We will treat her with azithromycin at this time especially given her significant medical history of diabetes, hypertension, atrial fibrillation, etc.  Also sent benzonatate encourage plenty of rest and fluids.  She has an albuterol inhaler at home but believes it is expired and asked for refill.  Sent ProAir to pharmacy.  Advise following up with PCP if she does not start to feel better in the next week.  Advise going to ER if she develops higher fever or worsening shortness of breath or weakness.   Final Clinical Impressions(s) / UC Diagnoses   Final diagnoses:  Lower respiratory infection  Acute cough  Shortness of breath     Discharge Instructions      -  An x-ray today did not show pneumonia per the radiologist but when I listen to your lungs you had a lot of crackling on the right lower side and your symptoms are suspicious for pneumonia Going to cover you for that possibility.  Sent azithromycin antibiotic to pharmacy and cough medication.  Increase rest and fluids. - If you develop a higher fever greater than 100.4 degrees or have chest pain or increased shortness of breath you need to be seen again right away.  If you have not noticed any improvement in the next week, follow-up with your PCP.     ED Prescriptions     Medication Sig Dispense Auth. Provider   benzonatate (TESSALON) 200 MG capsule Take 1 capsule (200 mg total) by mouth 3 (three) times daily as needed for cough. 20 capsule  Eusebio Friendly B, PA-C   azithromycin (ZITHROMAX) 250 MG tablet Take 1 tablet (250 mg total) by mouth daily. Take first 2 tablets together, then 1 every day until finished. 6 tablet Gareth Morgan      PDMP not reviewed this encounter.   Shirlee Latch, PA-C 02/25/22 [phone]

## 2022-02-25 NOTE — ED Triage Notes (Signed)
Pt c/o cough ( started over a week ago), low grade fever, shortness of breath, and upper back pain. Started today. Pt is concerned for pneumonia.

## 2022-02-25 NOTE — Discharge Instructions (Addendum)
-  An x-ray today did not show pneumonia per the radiologist but when I listen to your lungs you had a lot of crackling on the right lower side and your symptoms are suspicious for pneumonia Going to cover you for that possibility.  Sent azithromycin antibiotic to pharmacy and cough medication.  Increase rest and fluids. - If you develop a higher fever greater than 100.4 degrees or have chest pain or increased shortness of breath you need to be seen again right away.  If you have not noticed any improvement in the next week, follow-up with your PCP.

## 2022-03-07 DIAGNOSIS — I10 Essential (primary) hypertension: Secondary | ICD-10-CM | POA: Diagnosis not present

## 2022-03-07 DIAGNOSIS — E1140 Type 2 diabetes mellitus with diabetic neuropathy, unspecified: Secondary | ICD-10-CM | POA: Diagnosis not present

## 2022-03-13 ENCOUNTER — Other Ambulatory Visit: Payer: Self-pay | Admitting: Internal Medicine

## 2022-03-14 ENCOUNTER — Telehealth: Payer: Self-pay | Admitting: Internal Medicine

## 2022-03-14 NOTE — Telephone Encounter (Signed)
Pt is returning call to cma

## 2022-03-14 NOTE — Telephone Encounter (Signed)
S/w pt to confirm dosing of creon and that she is still taking in order to send pt assistance forms

## 2022-03-18 DIAGNOSIS — E1140 Type 2 diabetes mellitus with diabetic neuropathy, unspecified: Secondary | ICD-10-CM | POA: Diagnosis not present

## 2022-03-18 DIAGNOSIS — I10 Essential (primary) hypertension: Secondary | ICD-10-CM | POA: Diagnosis not present

## 2022-03-25 ENCOUNTER — Other Ambulatory Visit: Payer: Self-pay | Admitting: Internal Medicine

## 2022-03-25 ENCOUNTER — Telehealth: Payer: Self-pay | Admitting: Pharmacy Technician

## 2022-03-25 DIAGNOSIS — Z596 Low income: Secondary | ICD-10-CM

## 2022-03-25 NOTE — Progress Notes (Signed)
[facility])                                            [place] [facility] Team    03/25/2022  Beth Tyler 01-11-1946 [id number]  Received both patient and provider portion(s) of patient assistance application(s) for [name]. Faxed completed application and required documents into [employer].  Refaxed provider portion of [name] application for [employer] and [name]. Have patient's portion just need provider's portion to put with it to fax into [name]  Successful call to patient. [name] application is requiring [employer] number. Patient will have to find card and call me back  Kerrin Markman P. Jasper Ruminski, CPhT [facility]  [phone]

## 2022-04-03 ENCOUNTER — Other Ambulatory Visit: Payer: Self-pay

## 2022-04-12 ENCOUNTER — Telehealth: Payer: Self-pay | Admitting: Pharmacy Technician

## 2022-04-12 DIAGNOSIS — Z596 Low income: Secondary | ICD-10-CM

## 2022-04-12 NOTE — Progress Notes (Signed)
[facility])                                            [place] Team    04/12/2022  Beth Tyler 1945/11/19 [id number]  Received both patient and provider portion(s) of patient assistance application(s) for [name] and Basaglar. Faxed completed application and required documents into [name].    [name] P. [name], [place]  [phone]

## 2022-04-18 ENCOUNTER — Other Ambulatory Visit: Payer: Self-pay | Admitting: Internal Medicine

## 2022-04-20 ENCOUNTER — Other Ambulatory Visit: Payer: Self-pay | Admitting: Internal Medicine

## 2022-04-20 DIAGNOSIS — E1140 Type 2 diabetes mellitus with diabetic neuropathy, unspecified: Secondary | ICD-10-CM

## 2022-04-22 ENCOUNTER — Telehealth: Payer: Self-pay | Admitting: Pharmacy Technician

## 2022-04-22 DIAGNOSIS — Z596 Low income: Secondary | ICD-10-CM

## 2022-04-22 NOTE — Progress Notes (Signed)
[facility])                                            [place] Team    04/22/2022  Beth Tyler [birth year] [id number]  Two care coordination calls placed today to [place] for Basaglar and [name] and to [facility] for [name].  Care coordination call placed to [place] in regard [place] and [name] application.  Spoke to [place] who informs patient in 04/09/22-04/08/23. Patient is on auto fill and medications will ship automatically based on last fill dates in 2023.    Care coordination call placed to [employer] in regard to [facility] application.  Spoke to Kathlee [place] who informs patient is APPROVED 04/08/22-04/08/23. Patient will need to phone [employer] when refills are needed allowing 10-14 business days for processing and then the medication will be delivered to the patient's home.   Beth Tyler P. Beth Tyler, [place]  [phone]

## 2022-04-26 ENCOUNTER — Telehealth: Payer: Self-pay | Admitting: Pharmacy Technician

## 2022-04-26 DIAGNOSIS — Z596 Low income: Secondary | ICD-10-CM

## 2022-04-26 NOTE — Progress Notes (Signed)
[facility])                                            [place] Team    04/26/2022  VIKKI GAINS Oct 03, 1945 [id number]  Received both patient and provider portion(s) of patient assistance application(s) for [name]. Faxed completed application and required documents into [name].    Kamee Bobst P. Rickesha Veracruz, [place]  [phone]

## 2022-04-29 ENCOUNTER — Other Ambulatory Visit (INDEPENDENT_AMBULATORY_CARE_PROVIDER_SITE_OTHER): Payer: Medicare HMO

## 2022-04-29 DIAGNOSIS — E7800 Pure hypercholesterolemia, unspecified: Secondary | ICD-10-CM | POA: Diagnosis not present

## 2022-04-29 DIAGNOSIS — Z794 Long term (current) use of insulin: Secondary | ICD-10-CM | POA: Diagnosis not present

## 2022-04-29 DIAGNOSIS — E1140 Type 2 diabetes mellitus with diabetic neuropathy, unspecified: Secondary | ICD-10-CM | POA: Diagnosis not present

## 2022-04-29 LAB — LIPID PANEL
Cholesterol: 102 mg/dL (ref 0–200)
HDL: 47.2 mg/dL
LDL Cholesterol: 35 mg/dL (ref 0–99)
NonHDL: 55.22
Total CHOL/HDL Ratio: 2
Triglycerides: 99 mg/dL (ref 0.0–149.0)
VLDL: 19.8 mg/dL (ref 0.0–40.0)

## 2022-04-29 LAB — HEPATIC FUNCTION PANEL
ALT: 22 U/L (ref 0–35)
AST: 18 U/L (ref 0–37)
Albumin: 4.4 g/dL (ref 3.5–5.2)
Alkaline Phosphatase: 78 U/L (ref 39–117)
Bilirubin, Direct: 0.2 mg/dL (ref 0.0–0.3)
Total Bilirubin: 0.6 mg/dL (ref 0.2–1.2)
Total Protein: 6.8 g/dL (ref 6.0–8.3)

## 2022-04-29 LAB — BASIC METABOLIC PANEL WITH GFR
BUN: 25 mg/dL — ABNORMAL HIGH (ref 6–23)
CO2: 27 meq/L (ref 19–32)
Calcium: 9.2 mg/dL (ref 8.4–10.5)
Chloride: 103 meq/L (ref 96–112)
Creatinine, Ser: 1.19 mg/dL (ref 0.40–1.20)
GFR: 44.08 mL/min — ABNORMAL LOW
Glucose, Bld: 133 mg/dL — ABNORMAL HIGH (ref 70–99)
Potassium: 5.2 meq/L — ABNORMAL HIGH (ref 3.5–5.1)
Sodium: 138 meq/L (ref 135–145)

## 2022-04-29 LAB — HEMOGLOBIN A1C: Hgb A1c MFr Bld: 8 % — ABNORMAL HIGH (ref 4.6–6.5)

## 2022-04-30 ENCOUNTER — Ambulatory Visit (INDEPENDENT_AMBULATORY_CARE_PROVIDER_SITE_OTHER): Payer: Medicare HMO | Admitting: Internal Medicine

## 2022-04-30 ENCOUNTER — Encounter: Payer: Self-pay | Admitting: Internal Medicine

## 2022-04-30 VITALS — BP 120/68 | HR 64 | Temp 98.0°F | Resp 16 | Ht 62.0 in | Wt 194.0 lb

## 2022-04-30 DIAGNOSIS — I10 Essential (primary) hypertension: Secondary | ICD-10-CM

## 2022-04-30 DIAGNOSIS — E7800 Pure hypercholesterolemia, unspecified: Secondary | ICD-10-CM | POA: Diagnosis not present

## 2022-04-30 DIAGNOSIS — N183 Chronic kidney disease, stage 3 unspecified: Secondary | ICD-10-CM

## 2022-04-30 DIAGNOSIS — R0981 Nasal congestion: Secondary | ICD-10-CM | POA: Diagnosis not present

## 2022-04-30 DIAGNOSIS — R051 Acute cough: Secondary | ICD-10-CM

## 2022-04-30 DIAGNOSIS — I480 Paroxysmal atrial fibrillation: Secondary | ICD-10-CM | POA: Diagnosis not present

## 2022-04-30 DIAGNOSIS — D6859 Other primary thrombophilia: Secondary | ICD-10-CM

## 2022-04-30 DIAGNOSIS — E875 Hyperkalemia: Secondary | ICD-10-CM | POA: Diagnosis not present

## 2022-04-30 DIAGNOSIS — E1122 Type 2 diabetes mellitus with diabetic chronic kidney disease: Secondary | ICD-10-CM

## 2022-04-30 DIAGNOSIS — Z794 Long term (current) use of insulin: Secondary | ICD-10-CM | POA: Diagnosis not present

## 2022-04-30 DIAGNOSIS — I700 Atherosclerosis of aorta: Secondary | ICD-10-CM

## 2022-04-30 LAB — BASIC METABOLIC PANEL
BUN: 23 mg/dL (ref 6–23)
CO2: 25 mEq/L (ref 19–32)
Calcium: 9.2 mg/dL (ref 8.4–10.5)
Chloride: 103 mEq/L (ref 96–112)
Creatinine, Ser: 1.19 mg/dL (ref 0.40–1.20)
GFR: 44.08 mL/min — ABNORMAL LOW (ref >60.00)
Glucose, Bld: 183 mg/dL — ABNORMAL HIGH (ref 70–99)
Potassium: 5 mEq/L (ref 3.5–5.1)
Sodium: 135 mEq/L (ref 135–145)

## 2022-04-30 NOTE — Progress Notes (Signed)
Patient ID: Beth Tyler, female   DOB: 1946/02/11, 77 y.o.   MRN: [id number]   Subjective:    Patient ID: Beth Tyler, female    DOB: [birth year], 77 y.o.   MRN: [id number]   Patient here for  Chief Complaint  Patient presents with   Medical Management of Chronic Issues   .   HPI Here to follow up regarding hypertension, diabetes and hypercholesterolemia.  She is doing well.  No chest pain reported.  Breathing stable.  Some increased drainage.  Occasional tickle in her throat.  Clears through. No increased cough or congestion.  No fever.  No nasal stuffiness.  No sore throat.  Clears throat.  No acid reflux reported.  Does report bowels may flare more.  Has seen GI.  Minimal nausea - worse if goes long periods without eating.  No blood sugar readings today.  Plans to pick up [facility]  Discussed diet and exercise.    Past Medical History:  Diagnosis Date   Anemia    Arthritis    B12 deficiency    Carpal tunnel syndrome, bilateral    COVID-19 03/18/2019   Diabetes mellitus ([place]) 02/17/2012   Diabetes mellitus, type II (HCC)    Dyspnea    Gastritis    GERD (gastroesophageal reflux disease)    Hypercholesterolemia    Hypertension    IBS (irritable bowel syndrome)    Neuropathy    legs   Pernicious anemia    Vertigo    no episodes fo 9 or 10 yrs   Past Surgical History:  Procedure Laterality Date   ANTERIOR VITRECTOMY Right 12/01/2017   Procedure: ANTERIOR VITRECTOMY;  Surgeon: Eulogio Bear, MD;  Location: [place];  Service: Ophthalmology;  Laterality: Right;   BREAST BIOPSY Right 1980   neg   BREAST EXCISIONAL BIOPSY Right 1980   CATARACT EXTRACTION W/PHACO Left 10/13/2017   Procedure: CATARACT EXTRACTION PHACO AND INTRAOCULAR LENS PLACEMENT ([place])  LEFT  DIABETES;  Surgeon: Eulogio Bear, MD;  Location: [facility];  Service: Ophthalmology;  Laterality: Left;  Diabetic - insulin and oral meds   CATARACT EXTRACTION W/PHACO Right 12/01/2017    Procedure: CATARACT EXTRACTION PHACO AND INTRAOCULAR LENS PLACEMENT ([place])  RIGHT DIABETIC;  Surgeon: Eulogio Bear, MD;  Location: [facility];  Service: Ophthalmology;  Laterality: Right;  Diabetic - insulin and oral   CHOLECYSTECTOMY  1983   Family History  Problem Relation Age of Onset   Diabetes Father    Kidney disease Father    Heart disease Father    Alcohol abuse Mother    Dementia Mother    Diabetes Mother    Diabetes Other    Hemophilia Brother    Uterine cancer Sister    Diabetes Sister    COPD Sister    Breast cancer Maternal Aunt 46   Breast cancer Daughter 61   Stroke Daughter    Diabetes Son    Social History   Socioeconomic History   Marital status: Widowed    Spouse name: Not on file   Number of children: 4   Years of education: Not on file   Highest education level: Not on file  Occupational History   Not on file  Tobacco Use   Smoking status: Never   Smokeless tobacco: Never  Vaping Use   Vaping Use: Never used  Substance and Sexual Activity   Alcohol use: No    Alcohol/week: 0.0 standard drinks of alcohol   Drug  use: No   Sexual activity: Not Currently  Other Topics Concern   Not on file  Social History Narrative   Widowed. Has four children. Manager of an apartment building. No tobacco, alcohol or other drug use   Social Determinants of Health   Financial Resource Strain: Medium Risk (05/02/2021)   Overall Financial Resource Strain (CARDIA)    Difficulty of Paying Living Expenses: Somewhat hard  Food Insecurity: No Food Insecurity (07/30/2021)   Hunger Vital Sign    Worried About Running Out of Food in the Last Year: Never true    Ran Out of Food in the Last Year: Never true  Transportation Needs: No Transportation Needs (07/30/2021)   PRAPARE - Hydrologist (Medical): No    Lack of Transportation (Non-Medical): No  Physical Activity: Not on file  Stress: No Stress Concern Present (07/30/2021)    [place]    Feeling of Stress : Not at all  Social Connections: Moderately Integrated (07/30/2021)   Social Connection and Isolation Panel [NHANES]    Frequency of Communication with Friends and Family: More than three times a week    Frequency of Social Gatherings with Friends and Family: More than three times a week    Attends Religious Services: 1 to 4 times per year    Active Member of [employer] or Organizations: Yes    Attends Archivist Meetings: 1 to 4 times per year    Marital Status: Widowed     Review of Systems  Constitutional:  Negative for appetite change and unexpected weight change.  HENT:  Negative for congestion and sinus pressure.   Respiratory:  Negative for cough, chest tightness and shortness of breath.   Cardiovascular:  Negative for chest pain and palpitations.       Lower extremity swelling - improved.    Gastrointestinal:  Negative for abdominal pain, nausea and vomiting.  Genitourinary:  Negative for difficulty urinating and dysuria.  Musculoskeletal:  Negative for joint swelling and myalgias.  Skin:  Negative for color change and rash.  Neurological:  Negative for dizziness, light-headedness and headaches.  Psychiatric/Behavioral:  Negative for agitation and dysphoric mood.        Objective:     BP 120/68   Pulse 64   Temp 98 F (36.7 C)   Resp 16   Ht 5\' 2"  (1.575 m)   Wt 194 lb (88 kg)   BMI 35.48 kg/m  Wt Readings from Last 3 Encounters:  04/30/22 194 lb (88 kg)  02/25/22 195 lb 8.8 oz (88.7 kg)  12/28/21 195 lb 9.6 oz (88.7 kg)    Physical Exam Vitals reviewed.  Constitutional:      General: She is not in acute distress.    Appearance: Normal appearance.  HENT:     Head: Normocephalic and atraumatic.     Right Ear: External ear normal.     Left Ear: External ear normal.  Eyes:     General: No scleral icterus.       Right eye: No discharge.        Left eye:  No discharge.     Conjunctiva/sclera: Conjunctivae normal.  Neck:     Thyroid: No thyromegaly.  Cardiovascular:     Rate and Rhythm: Normal rate.     Comments: Rated controlled.  Pulmonary:     Effort: No respiratory distress.     Breath sounds: Normal breath sounds. No wheezing.  Abdominal:     General: Bowel sounds are normal.     Palpations: Abdomen is soft.     Tenderness: There is no abdominal tenderness.  Musculoskeletal:        General: No tenderness.     Cervical back: Neck supple. No tenderness.     Comments: No increased swelling.  (Lower extremity swelling improved).   Lymphadenopathy:     Cervical: No cervical adenopathy.  Skin:    Findings: No erythema or rash.  Neurological:     Mental Status: She is alert.  Psychiatric:        Mood and Affect: Mood normal.        Behavior: Behavior normal.      Outpatient Encounter Medications as of 04/30/2022  Medication Sig   acetaminophen (TYLENOL) 500 MG tablet Take 500-1,000 mg by mouth every 6 (six) hours as needed for mild pain or moderate pain.    albuterol (VENTOLIN HFA) 108 (90 Base) MCG/ACT inhaler Inhale 2 puffs into the lungs every 4 (four) hours as needed for wheezing or shortness of breath.   albuterol (VENTOLIN HFA) 108 (90 Base) MCG/ACT inhaler Inhale 1-2 puffs into the lungs every 6 (six) hours as needed for wheezing or shortness of breath.   amLODipine (NORVASC) 10 MG tablet Take 1 tablet by mouth once daily   Continuous Blood Gluc Sensor (FREESTYLE LIBRE 2 SENSOR) MISC USE TO CHECK BLOOD SUGARS AT LEAST 4 TIMES DAILY   ELIQUIS 5 MG TABS tablet Take 1 tablet by mouth twice daily   empagliflozin ([name]) 25 MG TABS tablet Take 1 tablet (25 mg total) by mouth daily before breakfast.   ferrous sulfate 325 (65 FE) MG tablet Take 325 mg by mouth daily with breakfast.   fluticasone (CUTIVATE) 0.05 % cream Apply 1 Application topically as needed. Use as directed.   Insulin Glargine (BASAGLAR KWIKPEN) 100 UNIT/ML  Inject 22 Units into the skin every morning.   insulin lispro ([name] KWIKPEN) 100 UNIT/ML KwikPen Inject 8-14 Units into the skin 3 (three) times daily.   Insulin Pen Needle (BD PEN NEEDLE NANO 2ND GEN) 32G X 4 MM MISC USE TO INJECT INSULIN 4 TIMES DAILY   ipratropium (ATROVENT) 0.06 % nasal spray Place 2 sprays into both nostrils 4 (four) times daily.   lisinopril (ZESTRIL) 20 MG tablet Take 1 tablet (20 mg total) by mouth daily.   MAGnesium-Oxide 400 (240 Mg) MG tablet Take 1 tablet by mouth once daily   metFORMIN (GLUCOPHAGE-XR) 500 MG 24 hr tablet Take 2 tablets by mouth twice daily   metoprolol tartrate (LOPRESSOR) 25 MG tablet Take 25 mg by mouth 2 (two) times daily.   Multiple Vitamin (MULTIVITAMIN) tablet Take 1 tablet by mouth daily.   mupirocin ointment (BACTROBAN) 2 % Apply 1 application. topically 2 (two) times daily.   ONETOUCH VERIO test strip USE AS DIRECTED   Pancrelipase, Lip-Prot-Amyl, ([name] PO) Take by mouth. Take 36,000-72,000 Units by mouth See admin instructions. Take 3 capsules by mouth three times daily with meals and take 2 capsule by mouth daily with snacks   pantoprazole (PROTONIX) 40 MG tablet TAKE 1 TABLET BY MOUTH TWICE DAILY BEFORE MEAL(S)   simvastatin (ZOCOR) 10 MG tablet TAKE 1 TABLET BY MOUTH AT BEDTIME   triamcinolone cream (KENALOG) 0.1 % APPLY  CREAM EXTERNALLY TWICE DAILY   vitamin B-12 (CYANOCOBALAMIN) 100 MCG tablet Take 100 mcg by mouth daily.   [DISCONTINUED] azithromycin (ZITHROMAX) 250 MG tablet Take 1 tablet (250 mg total) by  mouth daily. Take first 2 tablets together, then 1 every day until finished.   [DISCONTINUED] benzonatate (TESSALON) 200 MG capsule Take 1 capsule (200 mg total) by mouth 3 (three) times daily as needed for cough.   No facility-administered encounter medications on file as of 04/30/2022.     Lab Results  Component Value Date   WBC 9.3 07/10/2021   HGB 13.4 07/10/2021   HCT 40.9 07/10/2021   PLT 285.0 07/10/2021    GLUCOSE 183 (H) 04/30/2022   CHOL 102 04/29/2022   TRIG 99.0 04/29/2022   HDL 47.20 04/29/2022   LDLDIRECT 74.4 02/21/2012   LDLCALC 35 04/29/2022   ALT 22 04/29/2022   AST 18 04/29/2022   NA 135 04/30/2022   K 5.0 04/30/2022   CL 103 04/30/2022   CREATININE 1.19 04/30/2022   BUN 23 04/30/2022   CO2 25 04/30/2022   TSH 1.88 06/19/2021   INR 1.2 07/04/2021   HGBA1C 8.0 (H) 04/29/2022   MICROALBUR 2.9 (H) 12/28/2021    MM 3D SCREEN BREAST BILATERAL  Result Date: 08/23/2021 CLINICAL DATA:  Screening. EXAM: DIGITAL SCREENING BILATERAL MAMMOGRAM WITH TOMOSYNTHESIS AND CAD TECHNIQUE: Bilateral screening digital craniocaudal and mediolateral oblique mammograms were obtained. Bilateral screening digital breast tomosynthesis was performed. The images were evaluated with computer-aided detection. COMPARISON:  Previous exam(s). ACR Breast Density Category b: There are scattered areas of fibroglandular density. FINDINGS: There are no findings suspicious for malignancy. IMPRESSION: No mammographic evidence of malignancy. A result letter of this screening mammogram will be mailed directly to the patient. RECOMMENDATION: Screening mammogram in one year. (Code:SM-B-01Y) BI-RADS CATEGORY  1: Negative. Electronically Signed   By: Amie Portland M.D.   On: 08/23/2021 15:21       Assessment & Plan:   Problem List Items Addressed This Visit     Type 2 diabetes mellitus with diabetic chronic kidney disease (HCC)    On jardiance.  Also basaglar and humalog as outlined.  Discussed diet and exercise.  She has adjusted her diet. Has had issues with low sugars previously.  Avoid GLP 1 agonist given persistent GI issues.  Discussed CGM.  She plans to pick up [facility] sensor.  Discussed checking blood sugars and send in readings.  Also refer to Catie for diabetes management and medication management.  Follow met b and a1c.       Relevant Orders   AMB Referral to Pharmacy Medication Management   Thrombophilia  (HCC)    afib on eliquis.       Paroxysmal A-fib (HCC)    Continue metoprolol and eliquis.  Follow.  Stable.       Hypercholesterolemia    Continue simvastatin.  Low cholesterol diet and exercise.  Follow lipid panel and liver function tests.       Cough   Relevant Orders   COVID-19, Flu A+B and RSV (Completed)   Congestion of nasal sinus    Clearing throat and drainage as outlined.  No chest congestion.  Treat with robitussin DM and nasacort as directed.  Follow.  Call with update.       Benign essential hypertension    Continue metoprolol and amlodipine.  Blood pressure as outlined. On lisinopril 20mg  q day.  Blood pressure here as outlined.  120/68 on my check.  Hold on changing medication. Follow pressures.  Follow metabolic panel.       Aortic atherosclerosis (HCC)    Continue simvastatin.       Other Visit Diagnoses  Hyperkalemia    -  Primary   Relevant Orders   Basic metabolic panel (Completed)       Dale Wallace, MD

## 2022-04-30 NOTE — Patient Instructions (Addendum)
Robitussin DM - twice a day as needed for cough and congestion.   Saline nasal spray - flush nose 2x/day  Nasacort nasal spray - 2 sprays each nostril one time per day.  Do this in the evening.  

## 2022-05-01 ENCOUNTER — Other Ambulatory Visit: Payer: Self-pay

## 2022-05-01 DIAGNOSIS — E875 Hyperkalemia: Secondary | ICD-10-CM

## 2022-05-01 LAB — COVID-19, FLU A+B AND RSV
Influenza A, NAA: NOT DETECTED
Influenza B, NAA: NOT DETECTED
RSV, NAA: NOT DETECTED
SARS-CoV-2, NAA: NOT DETECTED

## 2022-05-04 ENCOUNTER — Encounter: Payer: Self-pay | Admitting: Internal Medicine

## 2022-05-04 DIAGNOSIS — R0981 Nasal congestion: Secondary | ICD-10-CM | POA: Insufficient documentation

## 2022-05-04 NOTE — Assessment & Plan Note (Signed)
Clearing throat and drainage as outlined.  No chest congestion.  Treat with robitussin DM and nasacort as directed.  Follow.  Call with update.

## 2022-05-04 NOTE — Assessment & Plan Note (Signed)
afib on eliquis.  

## 2022-05-04 NOTE — Assessment & Plan Note (Signed)
Continue simvastatin. 

## 2022-05-04 NOTE — Assessment & Plan Note (Signed)
Continue metoprolol and eliquis.  Follow.  Stable.

## 2022-05-04 NOTE — Assessment & Plan Note (Signed)
On jardiance.  Also basaglar and humalog as outlined.  Discussed diet and exercise.  She has adjusted her diet. Has had issues with low sugars previously.  Avoid GLP 1 agonist given persistent GI issues.  Discussed CGM.  She plans to pick up [facility] sensor.  Discussed checking blood sugars and send in readings.  Also refer to Catie for diabetes management and medication management.  Follow met b and a1c.

## 2022-05-04 NOTE — Assessment & Plan Note (Signed)
Continue metoprolol and amlodipine.  Blood pressure as outlined. On lisinopril 20mg  q day.  Blood pressure here as outlined.  120/68 on my check.  Hold on changing medication. Follow pressures.  Follow metabolic panel.

## 2022-05-04 NOTE — Assessment & Plan Note (Signed)
Continue simvastatin.  Low cholesterol diet and exercise.  Follow lipid panel and liver function tests.   

## 2022-05-06 ENCOUNTER — Telehealth: Payer: Self-pay

## 2022-05-06 NOTE — Progress Notes (Signed)
   Care Guide Note  05/06/2022 Name: KRYSTYL CANNELL MRN: [id number] DOB: Jun 04, 1945  Referred by: Einar Pheasant, MD Reason for referral : Care Coordination (Outreach to schedule with Pharm D )   Clotile KATERI BALCH is a 77 y.o. year old female who is a primary care patient of Einar Pheasant, MD. [name] was referred to the pharmacist for assistance related to DM.    Successful contact was made with the patient to discuss pharmacy services including being ready for the pharmacist to call at least 5 minutes before the scheduled appointment time, to have medication bottles and any blood sugar or blood pressure readings ready for review. The patient agreed to meet with the pharmacist via with the pharmacist via telephone visit on (date/time).  06/03/2022  Noreene Larsson, [place], [place] [address] Direct Dial: [phone] [email or web address]

## 2022-05-17 ENCOUNTER — Other Ambulatory Visit: Payer: Self-pay | Admitting: Internal Medicine

## 2022-05-17 MED ORDER — FREESTYLE LIBRE 2 SENSOR MISC: 0 refills | Status: DC

## 2022-05-21 ENCOUNTER — Telehealth: Payer: Self-pay | Admitting: Pharmacy Technician

## 2022-05-21 DIAGNOSIS — Z596 Low income: Secondary | ICD-10-CM

## 2022-05-21 NOTE — Progress Notes (Signed)
[facility])                                            [place] Team    05/21/2022  Beth Tyler [birth year] [id number]  Care coordination call placed to [place] in regard to [name] application.  Spoke to [place] who informs patient is APPROVED 05/20/22-04/08/23. Patient will have to call [name] at [phone] to reorder the medication allowing 10-14 business days for processing and delivery.  [name] P. [name], [place]  [phone]

## 2022-05-29 ENCOUNTER — Other Ambulatory Visit (INDEPENDENT_AMBULATORY_CARE_PROVIDER_SITE_OTHER): Payer: Medicare HMO

## 2022-05-29 ENCOUNTER — Other Ambulatory Visit: Payer: Medicare HMO

## 2022-05-29 DIAGNOSIS — I10 Essential (primary) hypertension: Secondary | ICD-10-CM

## 2022-05-29 DIAGNOSIS — E875 Hyperkalemia: Secondary | ICD-10-CM

## 2022-05-29 LAB — BASIC METABOLIC PANEL
BUN/Creatinine Ratio: 30 (calc) — ABNORMAL HIGH (ref 6–22)
BUN: 31 mg/dL — ABNORMAL HIGH (ref 7–25)
CO2: 23 mmol/L (ref 20–32)
Calcium: 9.2 mg/dL (ref 8.6–10.4)
Chloride: 105 mmol/L (ref 98–110)
Creat: 1.03 mg/dL — ABNORMAL HIGH (ref 0.60–1.00)
Glucose, Bld: 158 mg/dL — ABNORMAL HIGH (ref 65–99)
Potassium: 4.5 mmol/L (ref 3.5–5.3)
Sodium: 139 mmol/L (ref 135–146)

## 2022-05-30 ENCOUNTER — Telehealth: Payer: Self-pay

## 2022-05-30 NOTE — Telephone Encounter (Signed)
LMTCB regarding results.  

## 2022-05-30 NOTE — Telephone Encounter (Signed)
Noted  

## 2022-05-30 NOTE — Telephone Encounter (Signed)
-----   Message from Einar Pheasant, MD sent at 05/30/2022  4:26 AM EST ----- Please call and notify potassium wnl.  Kidney function improved from last check. Will continue to follow.

## 2022-05-30 NOTE — Telephone Encounter (Signed)
Pt called and I read the message to her and she verbalized understanding

## 2022-06-03 ENCOUNTER — Other Ambulatory Visit: Payer: Medicare HMO | Admitting: Pharmacist

## 2022-06-05 ENCOUNTER — Other Ambulatory Visit: Payer: Medicare HMO | Admitting: Pharmacist

## 2022-06-05 MED ORDER — ROSUVASTATIN CALCIUM 5 MG PO TABS: 5.0000 mg | ORAL_TABLET | Freq: Every day | ORAL | 3 refills | Status: DC

## 2022-06-05 NOTE — Progress Notes (Signed)
06/05/2022 Name: Beth Tyler MRN: [id number] DOB: Aug 17, 1945  Chief Complaint  Patient presents with   Medication Management   Diabetes   Hypertension   Hyperlipidemia    Beth Tyler is a 77 y.o. year old female who presented for a telephone visit.   They were referred to the pharmacist by their PCP for assistance in managing diabetes.    Subjective:  Care Team: Primary Care Provider: Einar Pheasant, MD ; Next Scheduled Visit: 07/2022  Medication Access/Adherence  Current Pharmacy:  [facility] [address] [place] [address] Phone: [phone] Fax: [phone]  [facility] [place], [place] - [address] [place] [address] Phone: [phone] Fax: [place] [address] Phone: [phone] Fax: [phone]   Patient reports affordability concerns with their medications: No  Patient reports access/transportation concerns to their pharmacy: No  Patient reports adherence concerns with their medications:  No     Diabetes:  Current medications: Basaglar 22 units daily, [name] 10-14 units with meals, metformin XR 1000 mg twice daily, [name] 25 mg daily  Date of Download: past 14 days Average Glucose: 176 mg/dL - 146/153/191/223 Time in Goal:  - Time in range 70-180: 60% - Time above range: 40% - Time below range: 0%  Patient denies hypoglycemic s/sx including dizziness, shakiness, sweating. Patient denies hyperglycemic symptoms including polyuria, polydipsia, polyphagia, nocturia, neuropathy, blurred vision.  Current meal patterns:  - Breakfast: Biscuit - Lunch: 3 pm: trying to avoid carbohydrates (rice, bread, potatoes);  - Supper: 7-8 pm:    Hyperlipidemia/ASCVD Risk Reduction  Current lipid lowering medications: simvastatin 10  mg daily  Antiplatelet regimen: none due to DOAC  Patient does note muscle aches, bilateral, has always attributed more to other conditions than simvastatin  Atrial Fibrillation and Hypertension:  Current medications: Rate Control: metoprolol tartrate 25 mg twice daily Anticoagulation Regimen: Eliquis 5 mg twice daily  Additional antihypertensives: lisinopril 20 mg daily, amlodipine 10 mg daily   Patient denies hypotensive s/sx including dizziness, lightheadedness.     Objective:  Lab Results  Component Value Date   HGBA1C 8.0 (H) 04/29/2022    Lab Results  Component Value Date   CREATININE 1.03 (H) 05/29/2022   BUN 31 (H) 05/29/2022   NA 139 05/29/2022   K 4.5 05/29/2022   CL 105 05/29/2022   CO2 23 05/29/2022    Lab Results  Component Value Date   CHOL 102 04/29/2022   HDL 47.20 04/29/2022   LDLCALC 35 04/29/2022   LDLDIRECT 74.4 02/21/2012   TRIG 99.0 04/29/2022   CHOLHDL 2 04/29/2022    Medications Reviewed Today     Reviewed by Einar Pheasant, MD (Physician) on 05/04/22 at 2039  Med List Status: <None>   Medication Order Taking? Sig Documenting Provider Last Dose Status Informant  acetaminophen (TYLENOL) 500 MG tablet [id number] No Take 500-1,000 mg by mouth every 6 (six) hours as needed for mild pain or moderate pain.  [provider] Taking Active Self           Med Note De Hollingshead   Wed Mar 14, 2021 12:58 PM) Taking 1000 mg Q6H  albuterol (VENTOLIN HFA) 108 (90 Base) MCG/ACT inhaler [id number] No Inhale 2 puffs into the lungs every 4 (four) hours as needed for wheezing or shortness of breath. Lowella Petties  R, NP Taking Active   albuterol (VENTOLIN HFA) 108 (90 Base) MCG/ACT inhaler [id number]  Inhale 1-2 puffs into the lungs every 6 (six) hours as needed for wheezing or shortness of breath. Danton Clap, PA-C  Active   amLODipine (NORVASC) 10 MG tablet [id number]  Take 1 tablet by mouth once daily Einar Pheasant, MD  Active    Continuous Blood Gluc Sensor (FREESTYLE LIBRE 2 SENSOR) MISC [id number]  USE TO CHECK BLOOD SUGARS AT LEAST 4 TIMES DAILY Einar Pheasant, MD  Active   ELIQUIS 5 MG TABS tablet [id number] No Take 1 tablet by mouth twice daily Einar Pheasant, MD Taking Active   empagliflozin ([name]) 25 MG TABS tablet [id number] No Take 1 tablet (25 mg total) by mouth daily before breakfast. Einar Pheasant, MD Taking Active   ferrous sulfate 325 (65 FE) MG tablet [id number] No Take 325 mg by mouth daily with breakfast. [provider] Taking Active   fluticasone (CUTIVATE) 0.05 % cream [id number]  Apply 1 Application topically as needed. Use as directed. Einar Pheasant, MD  Active   Insulin Glargine [facility]) 100 UNIT/ML [id number] No Inject 22 Units into the skin every morning. Einar Pheasant, MD Taking Active   insulin lispro ([name] KWIKPEN) 100 UNIT/ML KwikPen [id number] No Inject 8-14 Units into the skin 3 (three) times daily. Einar Pheasant, MD Taking Active   Insulin Pen Needle (BD PEN NEEDLE NANO 2ND GEN) 32G X 4 MM MISC [id number]  USE TO INJECT INSULIN 4 TIMES DAILY Einar Pheasant, MD  Active   ipratropium (ATROVENT) 0.06 % nasal spray [id number] No Place 2 sprays into both nostrils 4 (four) times daily. Margarette Canada, NP Taking Active   lisinopril (ZESTRIL) 20 MG tablet [id number]  Take 1 tablet (20 mg total) by mouth daily. Einar Pheasant, MD  Active   MAGnesium-Oxide 400 (240 Mg) MG tablet [id number]  Take 1 tablet by mouth once daily Einar Pheasant, MD  Active   metFORMIN (GLUCOPHAGE-XR) 500 MG 24 hr tablet [id number]  Take 2 tablets by mouth twice daily Einar Pheasant, MD  Active   metoprolol tartrate (LOPRESSOR) 25 MG tablet [id number] No Take 25 mg by mouth 2 (two) times daily. [provider] Taking Active   Multiple Vitamin (MULTIVITAMIN) tablet [id number] No Take 1 tablet by mouth daily. [provider] Taking Active   mupirocin ointment (BACTROBAN) 2 % [id number]  No Apply 1 application. topically 2 (two) times daily. Einar Pheasant, MD Taking Active   [facility] VERIO test strip [id number]  USE AS DIRECTED Einar Pheasant, MD  Active   Pancrelipase, Lip-Prot-Amyl, ([name] PO) [id number] No Take by mouth. Take 36,000-72,000 Units by mouth See admin instructions. Take 3 capsules by mouth three times daily with meals and take 2 capsule by mouth daily with snacks [provider] Taking Active   pantoprazole (PROTONIX) 40 MG tablet [id number]  TAKE 1 TABLET BY MOUTH TWICE DAILY BEFORE MEAL(S) Einar Pheasant, MD  Active   simvastatin (ZOCOR) 10 MG tablet [id number]  TAKE 1 TABLET BY MOUTH AT BEDTIME Einar Pheasant, MD  Active   triamcinolone cream (KENALOG) 0.1 % [id number]  APPLY  CREAM EXTERNALLY TWICE DAILY Einar Pheasant, MD  Active   vitamin B-12 (CYANOCOBALAMIN) 100 MCG tablet [id number] No Take 100 mcg by mouth daily. [provider] Taking Active               Assessment/Plan:   Diabetes: - Currently uncontrolled - Reviewed long term cardiovascular and renal outcomes of uncontrolled blood sugar -  Reviewed goal A1c, goal fasting, and goal 2 hour post prandial glucose - Previously avoided GLP1 due to concurrent gastrointestinal upset, however, patient has more issues with diarrhea; GLP1 related constipation may be tolerated. Would consider low dose, lower potency Rybelsus and evaluate clinical impact. Could also consider checking c-peptide to determine if pancreatitic insufficiency is also impacting beta cell function. Will discuss with PCP.  - Recommend to continue current regimen at this time  - Recommend to check glucose continuously using CGM  Hyperlipidemia/ASCVD Risk Reduction: - Currently controlled but with drug interaction between simvastatin and amlodipine - Reviewed long term complications of uncontrolled cholesterol - Discussed drug interaction between simvastatin and amlodipine that could be increasing simvastatin  concentrations and causing muscle aches. Discontinue simvastatin, start rosuvastatin 5 mg daily, lower risk of myalgias as well as no drug interactions. Lipids to be checked as scheduled in April  Atrial Fibrillation: - Currently controlled - Reviewed importance of adherence to anticoagulant for stroke prevention. - Reviewed appropriate blood pressure monitoring technique and reviewed goal blood pressure. Recommended to check home blood pressure and heart rate periodically - Recommend to continue current regimen at this time   Follow Up Plan: phone call in 6 weeks  Catie Hedwig Morton, PharmD, [place], [facility] [phone]

## 2022-06-06 DIAGNOSIS — I10 Essential (primary) hypertension: Secondary | ICD-10-CM | POA: Diagnosis not present

## 2022-06-06 DIAGNOSIS — E1140 Type 2 diabetes mellitus with diabetic neuropathy, unspecified: Secondary | ICD-10-CM | POA: Diagnosis not present

## 2022-06-12 ENCOUNTER — Telehealth: Payer: Self-pay | Admitting: Pharmacist

## 2022-06-12 ENCOUNTER — Other Ambulatory Visit: Payer: Medicare HMO | Admitting: Pharmacist

## 2022-06-12 MED ORDER — RYBELSUS 3 MG PO TABS: 3.0000 mg | ORAL_TABLET | Freq: Every day | ORAL | 0 refills | Status: DC

## 2022-06-12 NOTE — Progress Notes (Addendum)
Please prepare a sample of Rybelsus 3 mg daily, #60 (2 boxes) for patient. Thanks!

## 2022-06-12 NOTE — Progress Notes (Signed)
Care Coordination  Contacted patient. Counseled on Rybelsus. Take 1 tablet first thing in the morning on an empty stomach, wait 30 minutes before food or other medications. Stop the medication if development of persistent nausea, stomach upset, or constipation. Patient verbalizes understanding.   Catie Hedwig Morton, PharmD, [place], [facility] [phone]

## 2022-06-13 NOTE — Telephone Encounter (Signed)
Medication Samples have been provided to the patient.  Drug name: Rybelsus     Strength: '3mg'$         Qty: #60  [id number]  Exp.Date: 03/2023  Dosing instructions: Take 1 tablet (3 mg total) by mouth daily.   The patient has been instructed regarding the correct time, dose, and frequency of taking this medication, including desired effects and most common side effects.   Cannon Kettle 10:43 AM 06/13/2022

## 2022-06-20 ENCOUNTER — Other Ambulatory Visit: Payer: Self-pay | Admitting: Internal Medicine

## 2022-06-21 MED ORDER — FREESTYLE LIBRE 2 SENSOR MISC: 0 refills | Status: DC

## 2022-06-21 MED ORDER — TRIAMCINOLONE ACETONIDE 0.1 % EX CREA: TOPICAL_CREAM | CUTANEOUS | 0 refills | Status: DC

## 2022-07-08 ENCOUNTER — Other Ambulatory Visit: Payer: Medicare HMO | Admitting: Pharmacist

## 2022-07-08 ENCOUNTER — Telehealth: Payer: Self-pay | Admitting: Pharmacist

## 2022-07-08 NOTE — Progress Notes (Unsigned)
Attempted to contact patient for scheduled appointment for medication management. Left HIPAA compliant message for patient to return my call at their convenience.    Catie T. Abijah Roussel, PharmD, BCACP, CPP [facility] [phone]  

## 2022-07-09 ENCOUNTER — Other Ambulatory Visit: Payer: Medicare HMO | Admitting: Pharmacist

## 2022-07-09 MED ORDER — RYBELSUS 7 MG PO TABS: 7.0000 mg | ORAL_TABLET | Freq: Every day | ORAL | 1 refills | Status: DC

## 2022-07-09 NOTE — Progress Notes (Signed)
07/09/2022 Name: Beth Tyler MRN: [id number] DOB: 1945/05/28  Chief Complaint  Patient presents with   Medication Management   Diabetes    Beth Tyler is a 77 y.o. year old female who presented for a telephone visit.   They were referred to the pharmacist by their PCP for assistance in managing diabetes.    Subjective:  Care Team: Primary Care Provider: Einar Pheasant, MD ; Next Scheduled Visit: 4/23  Medication Access/Adherence  Current Pharmacy:  [facility] [address], [place] - [place] [address] Phone: [phone] Fax: [phone]  [facility] [place], [place] - [address] [place] [address] Phone: [phone] Fax: [place] [address] Phone: [phone] Fax: [phone]   Patient reports affordability concerns with their medications: No  Patient reports access/transportation concerns to their pharmacy: No  Patient reports adherence concerns with their medications:  No     Diabetes:  Current medications: Basaglar 22 units daily, [name] 10-14 units with meals, metformin XR 1000 mg twice daily, [name] 25 mg daily; Rybelsus 3 mg once daily x 2 weeks    Current glucose readings: reports fastings generally better controlled <130; continues to have elevations after supper.   Denies worsening stomach upset, constipation. Notes stools have been more liquid and urgent lately, but not as related to Rybelsus addition.   Hyperlipidemia/ASCVD Risk Reduction  Current lipid lowering medications: rosuvastatin 5 mg daily  Does note some wrist aches since starting this medication - she started ~ 2 weeks ago, around the same time as Rybelsus    Objective:  Lab Results  Component Value Date   HGBA1C 8.0 (H) 04/29/2022    Lab Results  Component Value  Date   CREATININE 1.03 (H) 05/29/2022   BUN 31 (H) 05/29/2022   NA 139 05/29/2022   K 4.5 05/29/2022   CL 105 05/29/2022   CO2 23 05/29/2022    Lab Results  Component Value Date   CHOL 102 04/29/2022   HDL 47.20 04/29/2022   LDLCALC 35 04/29/2022   LDLDIRECT 74.4 02/21/2012   TRIG 99.0 04/29/2022   CHOLHDL 2 04/29/2022    Medications Reviewed Today     Reviewed by Osker Mason, RPH-CPP (Pharmacist) on 06/05/22 at 1444  Med List Status: <None>   Medication Order Taking? Sig Documenting Provider Last Dose Status Informant  acetaminophen (TYLENOL) 500 MG tablet [id number] Yes Take 500-1,000 mg by mouth every 6 (six) hours as needed for mild pain or moderate pain.  [provider] Taking Active Self           Med Note De Hollingshead   Wed Mar 14, 2021 12:58 PM) Taking 1000 mg Q6H  albuterol (VENTOLIN HFA) 108 (90 Base) MCG/ACT inhaler [id number] Yes Inhale 2 puffs into the lungs every 4 (four) hours as needed for wheezing or shortness of breath. Hans Eden, NP Taking Active   albuterol (VENTOLIN HFA) 108 (90 Base) MCG/ACT inhaler [id number] Yes Inhale 1-2 puffs into the lungs every 6 (six) hours as needed for wheezing or shortness of breath. Danton Clap, PA-C Taking Active   amLODipine (NORVASC) 10 MG tablet [id number] Yes Take 1 tablet by mouth once daily Einar Pheasant, MD Taking Active   Continuous Blood Gluc Sensor (FREESTYLE LIBRE 2 SENSOR) [place] [id number] Yes USE TO CHECK BLOOD SUGARS AT  LEAST 4 TIMES DAILY Einar Pheasant, MD Taking Active   ELIQUIS 5 MG TABS tablet [id number] Yes Take 1 tablet by mouth twice daily Einar Pheasant, MD Taking Active   empagliflozin ([name]) 25 MG TABS tablet [id number] Yes Take 1 tablet (25 mg total) by mouth daily before breakfast. Einar Pheasant, MD Taking Active   ferrous sulfate 325 (65 FE) MG tablet [id number] Yes Take 325 mg by mouth daily with breakfast. [provider] Taking Active   fluticasone  (CUTIVATE) 0.05 % cream [id number]  Apply 1 Application topically as needed. Use as directed. Einar Pheasant, MD  Active   Insulin Glargine [facility]) 100 UNIT/ML [id number] Yes Inject 22 Units into the skin every morning. Einar Pheasant, MD Taking Active   insulin lispro ([name] KWIKPEN) 100 UNIT/ML KwikPen [id number] Yes Inject 8-14 Units into the skin 3 (three) times daily. Einar Pheasant, MD Taking Active   Insulin Pen Needle (BD PEN NEEDLE NANO 2ND GEN) 32G X 4 MM MISC [id number] Yes USE TO INJECT INSULIN 4 TIMES DAILY Einar Pheasant, MD Taking Active   ipratropium (ATROVENT) 0.06 % nasal spray [id number] Yes Place 2 sprays into both nostrils 4 (four) times daily. Margarette Canada, NP Taking Active   lisinopril (ZESTRIL) 20 MG tablet [id number] Yes Take 1 tablet (20 mg total) by mouth daily. Einar Pheasant, MD Taking Active   MAGnesium-Oxide 400 (240 Mg) MG tablet [id number] Yes Take 1 tablet by mouth once daily Einar Pheasant, MD Taking Active   metFORMIN (GLUCOPHAGE-XR) 500 MG 24 hr tablet [id number] Yes Take 2 tablets by mouth twice daily Einar Pheasant, MD Taking Active   metoprolol tartrate (LOPRESSOR) 25 MG tablet [id number] Yes Take 25 mg by mouth 2 (two) times daily. [provider] Taking Active   Multiple Vitamin (MULTIVITAMIN) tablet [id number] Yes Take 1 tablet by mouth daily. [provider] Taking Active   mupirocin ointment (BACTROBAN) 2 % [id number]  Apply 1 application. topically 2 (two) times daily. Einar Pheasant, MD  Active   [facility] VERIO test strip [id number]  USE AS DIRECTED Einar Pheasant, MD  Active   Pancrelipase, Lip-Prot-Amyl, ([name] PO) [id number] Yes Take by mouth. Take 36,000-72,000 Units by mouth See admin instructions. Take 3 capsules by mouth three times daily with meals and take 2 capsule by mouth daily with snacks [provider] Taking Active   pantoprazole (PROTONIX) 40 MG tablet [id number] Yes TAKE 1 TABLET BY MOUTH TWICE DAILY  BEFORE MEAL(S) Einar Pheasant, MD Taking Active   rosuvastatin (CRESTOR) 5 MG tablet [id number] Yes Take 1 tablet (5 mg total) by mouth daily. Einar Pheasant, MD Taking Active   triamcinolone cream (KENALOG) 0.1 % [id number]  APPLY  CREAM EXTERNALLY TWICE DAILY Einar Pheasant, MD  Active   vitamin B-12 (CYANOCOBALAMIN) 100 MCG tablet [id number] Yes Take 100 mcg by mouth daily. [provider] Taking Active               Assessment/Plan:   Diabetes: - Currently uncontrolled - Recommend to pursue assistance for Rybelsus 7 mg daily. Will collaborate with CPhT for this. - Recommend to continue current regimen at this time   Hyperlipidemia/ASCVD Risk Reduction: - Currently uncontrolled.  - Recommend to hold rosuvastatin for 2 weeks. If no improvement in symptoms, unlikely to be rosuvastatin. If improvement in symptoms, still recommend rechallenge. Patient verbalizes understanding   Follow Up Plan: phone call in 4 weeks  Catie TJodi Mourning, PharmD, [place], [facility] [phone]

## 2022-07-11 ENCOUNTER — Telehealth: Payer: Self-pay | Admitting: Pharmacy Technician

## 2022-07-11 DIAGNOSIS — Z5986 Financial insecurity: Secondary | ICD-10-CM

## 2022-07-11 NOTE — Progress Notes (Signed)
[facility])                                            [place] Team    07/11/2022  [name] 07/16/1945 [id number]                                      Medication Assistance Referral  Referral From: [facility] Embedded PharmD Catie Beth Tyler  Medication/Company: Rybelsus / [employer] Patient application portion:  Education officer, museum portion: Faxed  to Dr. Einar Pheasant Provider address/fax verified via: Office website  .  Beth Tyler P. Beth Tyler, [place]  [phone]

## 2022-07-17 ENCOUNTER — Other Ambulatory Visit: Payer: Self-pay | Admitting: Internal Medicine

## 2022-07-20 ENCOUNTER — Other Ambulatory Visit: Payer: Self-pay | Admitting: Internal Medicine

## 2022-07-25 ENCOUNTER — Other Ambulatory Visit: Payer: Medicare HMO

## 2022-07-28 ENCOUNTER — Other Ambulatory Visit: Payer: Self-pay | Admitting: Internal Medicine

## 2022-07-28 NOTE — Telephone Encounter (Signed)
Will confirm at lab appt on 4/22 if this is needed.

## 2022-07-29 ENCOUNTER — Other Ambulatory Visit: Payer: Self-pay | Admitting: Internal Medicine

## 2022-07-29 ENCOUNTER — Other Ambulatory Visit (INDEPENDENT_AMBULATORY_CARE_PROVIDER_SITE_OTHER): Payer: Medicare HMO

## 2022-07-29 DIAGNOSIS — E7800 Pure hypercholesterolemia, unspecified: Secondary | ICD-10-CM

## 2022-07-29 DIAGNOSIS — D6859 Other primary thrombophilia: Secondary | ICD-10-CM

## 2022-07-29 DIAGNOSIS — I10 Essential (primary) hypertension: Secondary | ICD-10-CM

## 2022-07-29 DIAGNOSIS — Z794 Long term (current) use of insulin: Secondary | ICD-10-CM

## 2022-07-29 LAB — BASIC METABOLIC PANEL
BUN: 27 mg/dL — ABNORMAL HIGH (ref 6–23)
CO2: 27 mEq/L (ref 19–32)
Calcium: 9.5 mg/dL (ref 8.4–10.5)
Chloride: 104 mEq/L (ref 96–112)
Creatinine, Ser: 1.01 mg/dL (ref 0.40–1.20)
GFR: 53.57 mL/min — ABNORMAL LOW (ref >60.00)
Glucose, Bld: 112 mg/dL — ABNORMAL HIGH (ref 70–99)
Potassium: 4.6 mEq/L (ref 3.5–5.1)
Sodium: 140 mEq/L (ref 135–145)

## 2022-07-29 LAB — LIPID PANEL
Cholesterol: 106 mg/dL (ref 0–200)
HDL: 51.1 mg/dL (ref >39.00)
LDL Cholesterol: 34 mg/dL (ref 0–99)
NonHDL: 54.55
Total CHOL/HDL Ratio: 2
Triglycerides: 105 mg/dL (ref 0.0–149.0)
VLDL: 21 mg/dL (ref 0.0–40.0)

## 2022-07-29 LAB — HEPATIC FUNCTION PANEL
ALT: 18 U/L (ref 0–35)
AST: 18 U/L (ref 0–37)
Albumin: 4.6 g/dL (ref 3.5–5.2)
Alkaline Phosphatase: 82 U/L (ref 39–117)
Bilirubin, Direct: 0.2 mg/dL (ref 0.0–0.3)
Total Bilirubin: 0.7 mg/dL (ref 0.2–1.2)
Total Protein: 7.1 g/dL (ref 6.0–8.3)

## 2022-07-29 LAB — TSH: TSH: 1.11 u[IU]/mL (ref 0.35–5.50)

## 2022-07-29 NOTE — Progress Notes (Signed)
Orders placed for add on labs.  

## 2022-07-29 NOTE — Telephone Encounter (Signed)
Per patient, she still uses them from time to time to compare with [facility].  Refill sent

## 2022-07-30 ENCOUNTER — Ambulatory Visit (INDEPENDENT_AMBULATORY_CARE_PROVIDER_SITE_OTHER): Payer: Medicare HMO | Admitting: Internal Medicine

## 2022-07-30 ENCOUNTER — Encounter: Payer: Self-pay | Admitting: Internal Medicine

## 2022-07-30 VITALS — BP 128/70 | HR 61 | Temp 97.9°F | Resp 16 | Ht 62.0 in | Wt 191.0 lb

## 2022-07-30 DIAGNOSIS — I480 Paroxysmal atrial fibrillation: Secondary | ICD-10-CM

## 2022-07-30 DIAGNOSIS — E7800 Pure hypercholesterolemia, unspecified: Secondary | ICD-10-CM | POA: Diagnosis not present

## 2022-07-30 DIAGNOSIS — E1140 Type 2 diabetes mellitus with diabetic neuropathy, unspecified: Secondary | ICD-10-CM | POA: Diagnosis not present

## 2022-07-30 DIAGNOSIS — Z794 Long term (current) use of insulin: Secondary | ICD-10-CM

## 2022-07-30 DIAGNOSIS — D6859 Other primary thrombophilia: Secondary | ICD-10-CM | POA: Diagnosis not present

## 2022-07-30 DIAGNOSIS — N183 Chronic kidney disease, stage 3 unspecified: Secondary | ICD-10-CM

## 2022-07-30 DIAGNOSIS — E1122 Type 2 diabetes mellitus with diabetic chronic kidney disease: Secondary | ICD-10-CM | POA: Diagnosis not present

## 2022-07-30 DIAGNOSIS — I700 Atherosclerosis of aorta: Secondary | ICD-10-CM | POA: Diagnosis not present

## 2022-07-30 DIAGNOSIS — I10 Essential (primary) hypertension: Secondary | ICD-10-CM

## 2022-07-30 LAB — POCT GLYCOSYLATED HEMOGLOBIN (HGB A1C): Hemoglobin A1C: 7.4 % — AB (ref 4.0–5.6)

## 2022-07-30 MED ORDER — METFORMIN HCL ER 500 MG PO TB24: 1000.0000 mg | ORAL_TABLET | Freq: Two times a day (BID) | ORAL | 1 refills | Status: DC

## 2022-07-30 MED ORDER — MAGNESIUM OXIDE -MG SUPPLEMENT 400 (240 MG) MG PO TABS: 1.0000 | ORAL_TABLET | Freq: Every day | ORAL | 1 refills | Status: DC

## 2022-07-30 MED ORDER — AMLODIPINE BESYLATE 10 MG PO TABS: 10.0000 mg | ORAL_TABLET | Freq: Every day | ORAL | 1 refills | Status: DC

## 2022-07-30 MED ORDER — LISINOPRIL 20 MG PO TABS: 20.0000 mg | ORAL_TABLET | Freq: Every day | ORAL | 0 refills | Status: DC

## 2022-07-30 NOTE — Progress Notes (Signed)
Subjective:    Patient ID: Beth Tyler, female    DOB: 03-03-1946, 77 y.o.   MRN: [id number]  Patient here for  Chief Complaint  Patient presents with   Medical Management of Chronic Issues    HPI Here to follow up regarding hypertension, diabetes and hypercholesterolemia.  Feels overall things are stable.  Tries to stay as active as possible.  Breathing doing better.  No increased cough or congestion.  No chest pain reported.  No cough or congestion reported.  Persistent bowel issues.  Feels stable.  No increased leg swelling.  Reviewed blood sugar download.  POC A1c 7.4.  just recently added rybelsus. Time CGM active 77% with target range 61%, high 28% and very high (mostly night time readings) - 11%.  Has cut out red meats and fried foods.    Past Medical History:  Diagnosis Date   Anemia    Arthritis    B12 deficiency    Carpal tunnel syndrome, bilateral    COVID-19 03/18/2019   Diabetes mellitus (HCC) 02/17/2012   Diabetes mellitus, type II (HCC)    Dyspnea    Gastritis    GERD (gastroesophageal reflux disease)    Hypercholesterolemia    Hypertension    IBS (irritable bowel syndrome)    Neuropathy    legs   Pernicious anemia    Vertigo    no episodes fo 9 or 10 yrs   Past Surgical History:  Procedure Laterality Date   ANTERIOR VITRECTOMY Right 12/01/2017   Procedure: ANTERIOR VITRECTOMY;  Surgeon: Nevada Crane, MD;  Location: [facility];  Service: Ophthalmology;  Laterality: Right;   BREAST BIOPSY Right 1980   neg   BREAST EXCISIONAL BIOPSY Right 1980   CATARACT EXTRACTION W/PHACO Left 10/13/2017   Procedure: CATARACT EXTRACTION PHACO AND INTRAOCULAR LENS PLACEMENT (IOC)  LEFT  DIABETES;  Surgeon: Nevada Crane, MD;  Location: [facility];  Service: Ophthalmology;  Laterality: Left;  Diabetic - insulin and oral meds   CATARACT EXTRACTION W/PHACO Right 12/01/2017   Procedure: CATARACT EXTRACTION PHACO AND INTRAOCULAR LENS PLACEMENT  (IOC)  RIGHT DIABETIC;  Surgeon: Nevada Crane, MD;  Location: [facility];  Service: Ophthalmology;  Laterality: Right;  Diabetic - insulin and oral   CHOLECYSTECTOMY  1983   Family History  Problem Relation Age of Onset   Diabetes Father    Kidney disease Father    Heart disease Father    Alcohol abuse Mother    Dementia Mother    Diabetes Mother    Diabetes Other    Hemophilia Brother    Uterine cancer Sister    Diabetes Sister    COPD Sister    Breast cancer Maternal Aunt 34   Breast cancer Daughter 32   Stroke Daughter    Diabetes Son    Social History   Socioeconomic History   Marital status: Widowed    Spouse name: Not on file   Number of children: 4   Years of education: Not on file   Highest education level: Not on file  Occupational History   Not on file  Tobacco Use   Smoking status: Never   Smokeless tobacco: Never  Vaping Use   Vaping Use: Never used  Substance and Sexual Activity   Alcohol use: No    Alcohol/week: 0.0 standard drinks of alcohol   Drug use: No   Sexual activity: Not Currently  Other Topics Concern   Not on file  Social History  Narrative   Widowed. Has four children. Manager of an apartment building. No tobacco, alcohol or other drug use   Social Determinants of Health   Financial Resource Strain: Medium Risk (05/02/2021)   Overall Financial Resource Strain (CARDIA)    Difficulty of Paying Living Expenses: Somewhat hard  Food Insecurity: No Food Insecurity (07/30/2021)   Hunger Vital Sign    Worried About Running Out of Food in the Last Year: Never true    Ran Out of Food in the Last Year: Never true  Transportation Needs: No Transportation Needs (07/30/2021)   PRAPARE - Administrator, Civil Service (Medical): No    Lack of Transportation (Non-Medical): No  Physical Activity: Not on file  Stress: No Stress Concern Present (07/30/2021)   [employer] of Occupational Health - Occupational Stress  Questionnaire    Feeling of Stress : Not at all  Social Connections: Moderately Integrated (07/30/2021)   Social Connection and Isolation Panel [NHANES]    Frequency of Communication with Friends and Family: More than three times a week    Frequency of Social Gatherings with Friends and Family: More than three times a week    Attends Religious Services: 1 to 4 times per year    Active Member of [employer] or Organizations: Yes    Attends Banker Meetings: 1 to 4 times per year    Marital Status: Widowed     Review of Systems  Constitutional:  Negative for appetite change and unexpected weight change.  HENT:  Negative for congestion and sinus pressure.   Respiratory:  Negative for cough, chest tightness and shortness of breath.   Cardiovascular:  Negative for chest pain and palpitations.  Gastrointestinal:  Negative for nausea and vomiting.       Persistent loose/frequent stools as outlined.   Genitourinary:  Negative for difficulty urinating and dysuria.  Musculoskeletal:  Negative for joint swelling and myalgias.  Skin:  Negative for color change and rash.  Neurological:  Negative for dizziness and headaches.  Psychiatric/Behavioral:  Negative for agitation and dysphoric mood.        Objective:     BP 128/70   Pulse 61   Temp 97.9 F (36.6 C)   Resp 16   Ht  (1.575 m)   Wt 191 lb (86.6 kg)   SpO2 98%   BMI 34.93 kg/m  Wt Readings from Last 3 Encounters:  07/30/22 191 lb (86.6 kg)  04/30/22 194 lb (88 kg)  02/25/22 195 lb 8.8 oz (88.7 kg)    Physical Exam Vitals reviewed.  Constitutional:      General: She is not in acute distress.    Appearance: Normal appearance.  HENT:     Head: Normocephalic and atraumatic.     Right Ear: External ear normal.     Left Ear: External ear normal.  Eyes:     General: No scleral icterus.       Right eye: No discharge.        Left eye: No discharge.     Conjunctiva/sclera: Conjunctivae normal.  Neck:      Thyroid: No thyromegaly.  Cardiovascular:     Rate and Rhythm: Normal rate and regular rhythm.  Pulmonary:     Effort: No respiratory distress.     Breath sounds: Normal breath sounds. No wheezing.  Abdominal:     General: Bowel sounds are normal.     Palpations: Abdomen is soft.     Tenderness: There is no  abdominal tenderness.  Musculoskeletal:        General: No swelling or tenderness.     Cervical back: Neck supple. No tenderness.  Lymphadenopathy:     Cervical: No cervical adenopathy.  Skin:    Findings: No erythema or rash.  Neurological:     Mental Status: She is alert.  Psychiatric:        Mood and Affect: Mood normal.        Behavior: Behavior normal.      Outpatient Encounter Medications as of 07/30/2022  Medication Sig   acetaminophen (TYLENOL) 500 MG tablet Take 500-1,000 mg by mouth every 6 (six) hours as needed for mild pain or moderate pain.    albuterol (VENTOLIN HFA) 108 (90 Base) MCG/ACT inhaler Inhale 2 puffs into the lungs every 4 (four) hours as needed for wheezing or shortness of breath.   albuterol (VENTOLIN HFA) 108 (90 Base) MCG/ACT inhaler Inhale 1-2 puffs into the lungs every 6 (six) hours as needed for wheezing or shortness of breath.   amLODipine (NORVASC) 10 MG tablet Take 1 tablet (10 mg total) by mouth daily.   Continuous Glucose Sensor (FREESTYLE LIBRE 2 SENSOR) MISC USE TO CHECK BLOOD SUGARS AT LEAST 4 TIMES A DAY   ELIQUIS 5 MG TABS tablet Take 1 tablet by mouth twice daily   empagliflozin ([name]) 25 MG TABS tablet Take 1 tablet (25 mg total) by mouth daily before breakfast.   ferrous sulfate 325 (65 FE) MG tablet Take 325 mg by mouth daily with breakfast.   fluticasone (CUTIVATE) 0.05 % cream Apply 1 Application topically as needed. Use as directed.   glucose blood (ONETOUCH VERIO) test strip USE AS DIRECTED   Insulin Glargine (BASAGLAR KWIKPEN) 100 UNIT/ML Inject 22 Units into the skin every morning.   insulin lispro ([name] KWIKPEN) 100  UNIT/ML KwikPen Inject 8-14 Units into the skin 3 (three) times daily.   Insulin Pen Needle (BD PEN NEEDLE NANO 2ND GEN) 32G X 4 MM MISC USE TO INJECT INSULIN 4 TIMES DAILY   ipratropium (ATROVENT) 0.06 % nasal spray Place 2 sprays into both nostrils 4 (four) times daily.   lisinopril (ZESTRIL) 20 MG tablet Take 1 tablet (20 mg total) by mouth daily.   MAGnesium-Oxide 400 (240 Mg) MG tablet Take 1 tablet (400 mg total) by mouth daily.   metFORMIN (GLUCOPHAGE-XR) 500 MG 24 hr tablet Take 2 tablets (1,000 mg total) by mouth 2 (two) times daily.   metoprolol tartrate (LOPRESSOR) 25 MG tablet Take 25 mg by mouth 2 (two) times daily.   Multiple Vitamin (MULTIVITAMIN) tablet Take 1 tablet by mouth daily.   mupirocin ointment (BACTROBAN) 2 % Apply 1 application. topically 2 (two) times daily.   Pancrelipase, Lip-Prot-Amyl, ([name] PO) Take by mouth. Take 36,000-72,000 Units by mouth See admin instructions. Take 3 capsules by mouth three times daily with meals and take 2 capsule by mouth daily with snacks   pantoprazole (PROTONIX) 40 MG tablet TAKE 1 TABLET BY MOUTH TWICE DAILY BEFORE MEAL(S)   rosuvastatin (CRESTOR) 5 MG tablet Take 1 tablet (5 mg total) by mouth daily.   Semaglutide (RYBELSUS) 3 MG TABS Take 1 tablet (3 mg total) by mouth daily.   Semaglutide (RYBELSUS) 7 MG TABS Take 1 tablet (7 mg total) by mouth daily.   triamcinolone cream (KENALOG) 0.1 % APPLY  CREAM EXTERNALLY TWICE DAILY Strength: 0.1 %   vitamin B-12 (CYANOCOBALAMIN) 100 MCG tablet Take 100 mcg by mouth daily.   [DISCONTINUED] amLODipine (NORVASC)  10 MG tablet Take 1 tablet by mouth once daily   [DISCONTINUED] lisinopril (ZESTRIL) 20 MG tablet Take 1 tablet by mouth once daily   [DISCONTINUED] MAGnesium-Oxide 400 (240 Mg) MG tablet Take 1 tablet by mouth once daily   [DISCONTINUED] metFORMIN (GLUCOPHAGE-XR) 500 MG 24 hr tablet Take 2 tablets by mouth twice daily   No facility-administered encounter medications on file as of  07/30/2022.     Lab Results  Component Value Date   WBC 9.3 07/10/2021   HGB 13.4 07/10/2021   HCT 40.9 07/10/2021   PLT 285.0 07/10/2021   GLUCOSE 112 (H) 07/29/2022   CHOL 106 07/29/2022   TRIG 105.0 07/29/2022   HDL 51.10 07/29/2022   LDLDIRECT 74.4 02/21/2012   LDLCALC 34 07/29/2022   ALT 18 07/29/2022   AST 18 07/29/2022   NA 140 07/29/2022   K 4.6 07/29/2022   CL 104 07/29/2022   CREATININE 1.01 07/29/2022   BUN 27 (H) 07/29/2022   CO2 27 07/29/2022   TSH 1.11 07/29/2022   INR 1.2 07/04/2021   HGBA1C 7.4 (A) 07/30/2022   MICROALBUR 2.9 (H) 12/28/2021    DG Chest 2 View  Result Date: 02/25/2022 CLINICAL DATA:  Cough, congestion, and shortness of breath. EXAM: CHEST - 2 VIEW COMPARISON:  Chest two views 08/14/2021 FINDINGS: Cardiac silhouette and mediastinal contours are within normal limits. The lungs are clear. No pleural effusion or pneumothorax. Moderate multilevel disc space narrowing and anterior bridging osteophytes of the thoracic spine. Cholecystectomy clips. IMPRESSION: No active cardiopulmonary disease. Electronically Signed   By: Neita Garnet M.D.   On: 02/25/2022 18:43       Assessment & Plan:  Type 2 diabetes mellitus with stage 3 chronic kidney disease, with long-term current use of insulin, unspecified whether stage 3a or 3b CKD (HCC) Assessment & Plan: On jardiance.  Also basaglar and humalog as outlined.  Discussed diet and exercise.  She has adjusted her diet. Has had issues with low sugars previously.  POC A1c 7.4.  just recently added rybelsus. Time CGM active 77% with target range 61%, high 28% and very high (mostly night time readings) - 11%.  Has cut out red meats and fried foods.  Follow met b and a1c.   Orders: -     POCT glycosylated hemoglobin (Hb A1C) -     Hemoglobin A1c; Future  Hypercholesterolemia Assessment & Plan: Continue simvastatin.  Low cholesterol diet and exercise.  Follow lipid panel and liver function tests.   Orders: -      CBC with Differential/Platelet; Future -     Lipid panel; Future -     Hepatic function panel; Future -     Basic metabolic panel; Future  Type 2 diabetes mellitus with diabetic neuropathy, with long-term current use of insulin (HCC) -     metFORMIN HCl ER; Take 2 tablets (1,000 mg total) by mouth 2 (two) times daily.  Dispense: 360 tablet; Refill: 1  Aortic atherosclerosis (HCC) Assessment & Plan: Continue simvastatin.    Benign essential hypertension Assessment & Plan: Continue metoprolol and amlodipine. On lisinopril 20mg  q day.  Blood pressure here as outlined.   Hold on changing medication. Follow pressures.  Follow metabolic panel.    Paroxysmal A-fib (HCC) Assessment & Plan: Continue metoprolol and eliquis.  Follow.  Stable.    Thrombophilia (HCC) Assessment & Plan: afib on eliquis.    Other orders -     amLODIPine Besylate; Take 1 tablet (10 mg total) by mouth  daily.  Dispense: 90 tablet; Refill: 1 -     Lisinopril; Take 1 tablet (20 mg total) by mouth daily.  Dispense: 90 tablet; Refill: 0 -     Magnesium Oxide -Mg Supplement; Take 1 tablet (400 mg total) by mouth daily.  Dispense: 90 tablet; Refill: 1     Dale [facility]. Vincent, MD

## 2022-08-06 ENCOUNTER — Telehealth: Payer: Self-pay | Admitting: Internal Medicine

## 2022-08-06 ENCOUNTER — Encounter: Payer: Self-pay | Admitting: Internal Medicine

## 2022-08-06 NOTE — Assessment & Plan Note (Signed)
afib on eliquis.  

## 2022-08-06 NOTE — Assessment & Plan Note (Signed)
Continue metoprolol and amlodipine. On lisinopril 20mg  q day.  Blood pressure here as outlined.   Hold on changing medication. Follow pressures.  Follow metabolic panel.

## 2022-08-06 NOTE — Assessment & Plan Note (Signed)
Continue simvastatin.  Low cholesterol diet and exercise.  Follow lipid panel and liver function tests.   

## 2022-08-06 NOTE — Assessment & Plan Note (Signed)
Continue simvastatin. 

## 2022-08-06 NOTE — Assessment & Plan Note (Signed)
Continue metoprolol and eliquis.  Follow.  Stable.  

## 2022-08-06 NOTE — Assessment & Plan Note (Signed)
On jardiance.  Also basaglar and humalog as outlined.  Discussed diet and exercise.  She has adjusted her diet. Has had issues with low sugars previously.  POC A1c 7.4.  just recently added rybelsus. Time CGM active 77% with target range 61%, high 28% and very high (mostly night time readings) - 11%.  Has cut out red meats and fried foods.  Follow met b and a1c.

## 2022-08-06 NOTE — Telephone Encounter (Signed)
Contacted Beth Tyler to schedule their annual wellness visit. Appointment made for 08/14/2022.  Thank you,  [facility] Support [facility] Direct dial  [phone]

## 2022-08-14 ENCOUNTER — Ambulatory Visit (INDEPENDENT_AMBULATORY_CARE_PROVIDER_SITE_OTHER): Payer: Medicare HMO

## 2022-08-14 VITALS — Wt 191.0 lb

## 2022-08-14 DIAGNOSIS — Z0000 Encounter for general adult medical examination without abnormal findings: Secondary | ICD-10-CM

## 2022-08-14 NOTE — Progress Notes (Signed)
Subjective:   Beth Tyler is a 77 y.o. female who presents for Medicare Annual (Subsequent) preventive examination.  Review of Systems    I connected with  [name] on 08/14/22 by a audio enabled telemedicine application and verified that I am speaking with the correct person using two identifiers.  Patient Location: Home  Provider Location: Home Office  I discussed the limitations of evaluation and management by telemedicine. The patient expressed understanding and agreed to proceed.  Cardiac Risk Factors include: advanced age (>28men, >54 women);obesity (BMI >30kg/m2)     Objective:    Today's Vitals   08/14/22 0812  Weight: 191 lb (86.6 kg)   Body mass index is 34.93 kg/m.     08/14/2022    8:21 AM 02/25/2022    6:08 PM 07/04/2021   11:16 AM 04/16/2021    4:32 PM 02/19/2020    8:27 AM 01/28/2020    9:00 AM 12/06/2019    6:45 PM  Advanced Directives  Does Patient Have a Medical Advance Directive? Yes No No No No Yes No  Type of Estate agent of [place];Living will     Living will   Does patient want to make changes to medical advance directive?      No - Patient declined   Copy of Healthcare Power of Attorney in Chart? No - copy requested        Would patient like information on creating a medical advance directive?   No - Patient declined        Current Medications (verified) Outpatient Encounter Medications as of 08/14/2022  Medication Sig   acetaminophen (TYLENOL) 500 MG tablet Take 500-1,000 mg by mouth every 6 (six) hours as needed for mild pain or moderate pain.    albuterol (VENTOLIN HFA) 108 (90 Base) MCG/ACT inhaler Inhale 2 puffs into the lungs every 4 (four) hours as needed for wheezing or shortness of breath.   albuterol (VENTOLIN HFA) 108 (90 Base) MCG/ACT inhaler Inhale 1-2 puffs into the lungs every 6 (six) hours as needed for wheezing or shortness of breath.   amLODipine (NORVASC) 10 MG tablet Take 1 tablet (10 mg total)  by mouth daily.   Continuous Glucose Sensor (FREESTYLE LIBRE 2 SENSOR) MISC USE TO CHECK BLOOD SUGARS AT LEAST 4 TIMES A DAY   ELIQUIS 5 MG TABS tablet Take 1 tablet by mouth twice daily   empagliflozin ([name]) 25 MG TABS tablet Take 1 tablet (25 mg total) by mouth daily before breakfast.   ferrous sulfate 325 (65 FE) MG tablet Take 325 mg by mouth daily with breakfast.   fluticasone (CUTIVATE) 0.05 % cream Apply 1 Application topically as needed. Use as directed.   glucose blood (ONETOUCH VERIO) test strip USE AS DIRECTED   Insulin Glargine (BASAGLAR KWIKPEN) 100 UNIT/ML Inject 22 Units into the skin every morning.   insulin lispro ([name] KWIKPEN) 100 UNIT/ML KwikPen Inject 8-14 Units into the skin 3 (three) times daily.   Insulin Pen Needle (BD PEN NEEDLE NANO 2ND GEN) 32G X 4 MM MISC USE TO INJECT INSULIN 4 TIMES DAILY   ipratropium (ATROVENT) 0.06 % nasal spray Place 2 sprays into both nostrils 4 (four) times daily.   lisinopril (ZESTRIL) 20 MG tablet Take 1 tablet (20 mg total) by mouth daily.   MAGnesium-Oxide 400 (240 Mg) MG tablet Take 1 tablet (400 mg total) by mouth daily.   metFORMIN (GLUCOPHAGE-XR) 500 MG 24 hr tablet Take 2 tablets (1,000 mg total) by  mouth 2 (two) times daily.   metoprolol tartrate (LOPRESSOR) 25 MG tablet Take 25 mg by mouth 2 (two) times daily.   Multiple Vitamin (MULTIVITAMIN) tablet Take 1 tablet by mouth daily.   mupirocin ointment (BACTROBAN) 2 % Apply 1 application. topically 2 (two) times daily.   Pancrelipase, Lip-Prot-Amyl, ([name] PO) Take by mouth. Take 36,000-72,000 Units by mouth See admin instructions. Take 3 capsules by mouth three times daily with meals and take 2 capsule by mouth daily with snacks   pantoprazole (PROTONIX) 40 MG tablet TAKE 1 TABLET BY MOUTH TWICE DAILY BEFORE MEAL(S)   rosuvastatin (CRESTOR) 5 MG tablet Take 1 tablet (5 mg total) by mouth daily.   Semaglutide (RYBELSUS) 3 MG TABS Take 1 tablet (3 mg total) by mouth daily.    Semaglutide (RYBELSUS) 7 MG TABS Take 1 tablet (7 mg total) by mouth daily.   triamcinolone cream (KENALOG) 0.1 % APPLY  CREAM EXTERNALLY TWICE DAILY Strength: 0.1 %   vitamin B-12 (CYANOCOBALAMIN) 100 MCG tablet Take 100 mcg by mouth daily.   No facility-administered encounter medications on file as of 08/14/2022.    Allergies (verified) Hydrocodone, Hydrocodone-acetaminophen, Oxycodone-acetaminophen, Percocet [oxycodone-acetaminophen], Demeclocycline, Other, Penicillins, and Tetracyclines & related   History: Past Medical History:  Diagnosis Date   Anemia    Arthritis    B12 deficiency    Carpal tunnel syndrome, bilateral    COVID-19 03/18/2019   Diabetes mellitus (HCC) 02/17/2012   Diabetes mellitus, type II (HCC)    Dyspnea    Gastritis    GERD (gastroesophageal reflux disease)    Hypercholesterolemia    Hypertension    IBS (irritable bowel syndrome)    Neuropathy    legs   Pernicious anemia    Vertigo    no episodes fo 9 or 10 yrs   Past Surgical History:  Procedure Laterality Date   ANTERIOR VITRECTOMY Right 12/01/2017   Procedure: ANTERIOR VITRECTOMY;  Surgeon: Nevada Crane, MD;  Location: [facility];  Service: Ophthalmology;  Laterality: Right;   BREAST BIOPSY Right 1980   neg   BREAST EXCISIONAL BIOPSY Right 1980   CATARACT EXTRACTION W/PHACO Left 10/13/2017   Procedure: CATARACT EXTRACTION PHACO AND INTRAOCULAR LENS PLACEMENT (IOC)  LEFT  DIABETES;  Surgeon: Nevada Crane, MD;  Location: [facility];  Service: Ophthalmology;  Laterality: Left;  Diabetic - insulin and oral meds   CATARACT EXTRACTION W/PHACO Right 12/01/2017   Procedure: CATARACT EXTRACTION PHACO AND INTRAOCULAR LENS PLACEMENT (IOC)  RIGHT DIABETIC;  Surgeon: Nevada Crane, MD;  Location: [facility];  Service: Ophthalmology;  Laterality: Right;  Diabetic - insulin and oral   CHOLECYSTECTOMY  1983   Family History  Problem Relation Age of Onset   Diabetes  Father    Kidney disease Father    Heart disease Father    Alcohol abuse Mother    Dementia Mother    Diabetes Mother    Diabetes Other    Hemophilia Brother    Uterine cancer Sister    Diabetes Sister    COPD Sister    Breast cancer Maternal Aunt 69   Breast cancer Daughter 19   Stroke Daughter    Diabetes Son    Social History   Socioeconomic History   Marital status: Widowed    Spouse name: Not on file   Number of children: 4   Years of education: Not on file   Highest education level: Not on file  Occupational History   Not  on file  Tobacco Use   Smoking status: Never   Smokeless tobacco: Never  Vaping Use   Vaping Use: Never used  Substance and Sexual Activity   Alcohol use: No    Alcohol/week: 0.0 standard drinks of alcohol   Drug use: No   Sexual activity: Not Currently  Other Topics Concern   Not on file  Social History Narrative   Widowed. Has four children. Manager of an apartment building. No tobacco, alcohol or other drug use   Social Determinants of Health   Financial Resource Strain: Low Risk  (08/14/2022)   Overall Financial Resource Strain (CARDIA)    Difficulty of Paying Living Expenses: Not hard at all  Food Insecurity: No Food Insecurity (08/14/2022)   Hunger Vital Sign    Worried About Running Out of Food in the Last Year: Never true    Ran Out of Food in the Last Year: Never true  Transportation Needs: No Transportation Needs (08/14/2022)   PRAPARE - Administrator, Civil Service (Medical): No    Lack of Transportation (Non-Medical): No  Physical Activity: Insufficiently Active (08/14/2022)   Exercise Vital Sign    Days of Exercise per Week: 7 days    Minutes of Exercise per Session: 20 min  Stress: No Stress Concern Present (08/14/2022)   [employer] of Occupational Health - Occupational Stress Questionnaire    Feeling of Stress : Not at all  Social Connections: Socially Integrated (08/14/2022)   Social Connection and Isolation  Panel [NHANES]    Frequency of Communication with Friends and Family: More than three times a week    Frequency of Social Gatherings with Friends and Family: More than three times a week    Attends Religious Services: More than 4 times per year    Active Member of [employer] or Organizations: Yes    Attends Engineer, structural: More than 4 times per year    Marital Status: Married    Tobacco Counseling Counseling given: Yes   Clinical Intake:  Pre-visit preparation completed: Yes  Pain : No/denies pain     BMI - recorded: 34.93 Nutritional Status: BMI > 30  Obese Nutritional Risks: None Diabetes: Yes CBG done?: No Did pt. bring in CBG monitor from home?: No  How often do you need to have someone help you when you read instructions, pamphlets, or other written materials from your doctor or pharmacy?: 1 - Never  Diabetic?yes  Interpreter Needed?: No  Information entered by :: Fredirick Maudlin   Activities of Daily Living    08/14/2022    8:23 AM  In your present state of health, do you have any difficulty performing the following activities:  Hearing? 0  Vision? 0  Difficulty concentrating or making decisions? 0  Walking or climbing stairs? 1  Comment use cane and walker ,shortness of breath  Dressing or bathing? 0  Doing errands, shopping? 0  Preparing Food and eating ? N  Using the Toilet? N  In the past six months, have you accidently leaked urine? N  Do you have problems with loss of bowel control? N  Managing your Medications? N  Managing your Finances? N  Housekeeping or managing your Housekeeping? N    Patient Care Team: Dale Caledonia, MD as PCP - General (Internal Medicine) Louellen Molder, NP as Nurse Practitioner (Gastroenterology) Leanora Ivanoff, PA-[facility] (Nephrology)  Indicate any recent Medical Services you may have received from other than [facility] providers in the  past year (date may be  approximate).     Assessment:   This is a routine wellness examination for Beth Tyler.  Hearing/Vision screen Hearing Screening - Comments:: Denies hearing difficulties   Vision Screening - Comments:: up to date with routine eye exams with  Dr Willey Blade    Dietary issues and exercise activities discussed: Current Exercise Habits: Home exercise routine, Type of exercise: stretching;walking, Time (Minutes): 30, Frequency (Times/Week): 4, Weekly Exercise (Minutes/Week): 120   Goals Addressed               This Visit's Progress     Patient Stated     Portion control (pt-stated)   On track     Maintain weight Stay active      Other     Patient Stated        Stay heathly      Depression Screen    08/14/2022    8:18 AM 04/30/2022   10:09 AM 12/28/2021    9:36 AM 09/21/2021    2:47 PM 08/14/2021    9:31 AM 07/30/2021    9:02 AM 02/16/2021   10:28 AM  PHQ 2/9 Scores  PHQ - 2 Score 0 0 0 0 0 0 0  PHQ- 9 Score  2         Fall Risk    08/14/2022    8:23 AM 04/30/2022   10:07 AM 12/28/2021    9:36 AM 09/21/2021    2:47 PM 08/14/2021    9:31 AM  Fall Risk   Falls in the past year? 0 0 0 0 0  Number falls in past yr: 0 0     Injury with Fall? 0 0     Risk for fall due to : No Fall Risks No Fall Risks No Fall Risks No Fall Risks No Fall Risks  Follow up Falls prevention discussed;Falls evaluation completed Falls evaluation completed Falls evaluation completed Falls evaluation completed Falls evaluation completed    FALL RISK PREVENTION PERTAINING TO THE HOME:  Any stairs in or around the home? No  If so, are there any without handrails? No  Home free of loose throw rugs in walkways, pet beds, electrical cords, etc? No  Adequate lighting in your home to reduce risk of falls? No   ASSISTIVE DEVICES UTILIZED TO PREVENT FALLS:  Life alert? No  Use of a cane, walker or w/c? Yes  Grab bars in the bathroom? Yes  Shower chair or bench in shower? Yes  Elevated toilet seat or a  handicapped toilet? Yes   TIMED UP AND GO:  Was the test performed? No . Televisit    Cognitive Function:    12/09/2017   12:42 PM 04/05/2016    9:49 AM 04/06/2015    9:46 AM  MMSE - Mini Mental State Exam  Orientation to time 5 5 5   Orientation to Place 5 5 5   Registration 3 3 3   Attention/ Calculation 5 5 5   Recall 3 3 3   Language- name 2 objects 2 2 2   Language- repeat 1 1 1   Language- follow 3 step command 3 3 3   Language- read & follow direction 1 1 1   Write a sentence 1 1 1   Copy design 1 1 1   Total score 30 30 30         08/14/2022    8:19 AM 01/28/2020    9:00 AM 01/27/2019   10:23 AM  6CIT Screen  What Year? 0 points 0 points 0  points  What month? 0 points 0 points 0 points  What time? 0 points  0 points  Count back from 20 0 points  0 points  Months in reverse 0 points 0 points 0 points  Repeat phrase 0 points  0 points  Total Score 0 points  0 points    Immunizations Immunization History  Administered Date(s) Administered   Fluad Quad(high Dose 65+) 12/21/2018, 01/28/2020, 01/16/2021, 12/28/2021   Influenza Split 02/04/2012   Influenza, High Dose Seasonal PF 12/19/2015, 12/17/2016, 01/08/2018   Influenza,inj,Quad PF,6+ Mos 01/22/2013, 01/19/2014, 12/29/2014   Moderna Sars-Covid-2 Vaccination 07/07/2019   Pneumococcal Conjugate-13 02/06/2010, 06/11/2013   Pneumococcal Polysaccharide-23 02/11/2018    TDAP status: Due, Education has been provided regarding the importance of this vaccine. Advised may receive this vaccine at local pharmacy or Health Dept. Aware to provide a copy of the vaccination record if obtained from local pharmacy or Health Dept. Verbalized acceptance and understanding.  Flu Vaccine status: Up to date  Pneumococcal vaccine status: Up to date  Covid-19 vaccine status: Completed vaccines  Qualifies for Shingles Vaccine? Yes   Zostavax completed No   Shingrix Completed?: No.    Education has been provided regarding the importance of  this vaccine. Patient has been advised to call insurance company to determine out of pocket expense if they have not yet received this vaccine. Advised may also receive vaccine at local pharmacy or Health Dept. Verbalized acceptance and understanding.  Screening Tests Health Maintenance  Topic Date Due   DTaP/Tdap/Td (1 - Tdap) Never done   Zoster Vaccines- Shingrix (1 of 2) 12/30/2022 (Originally [birth year]1994)   FOOT EXAM  08/15/2022   MAMMOGRAM  08/24/2022   INFLUENZA VACCINE  11/07/2022   Diabetic kidney evaluation - Urine ACR  12/29/2022   HEMOGLOBIN A1C  01/29/2023   OPHTHALMOLOGY EXAM  02/16/2023   Diabetic kidney evaluation - eGFR measurement  07/29/2023   Medicare Annual Wellness (AWV)  08/14/2023   Pneumonia Vaccine 21+ Years old  Completed   DEXA SCAN  Completed   Hepatitis C Screening  Completed   HPV VACCINES  Aged Out   COLONOSCOPY (Pts 45-90yrs Insurance coverage will need to be confirmed)  Discontinued   COVID-19 Vaccine  Discontinued    Health Maintenance  Health Maintenance Due  Topic Date Due   DTaP/Tdap/Td (1 - Tdap) Never done    Colorectal cancer screening: Type of screening: Colonoscopy. Completed 11/19/11. Repeat every 10 years  Mammogram status: Completed 08/23/21. Repeat every year  Bone Density status: Completed 06/01/15. Results reflect: Bone density results: NORMAL. Repeat every 10 years.  Lung Cancer Screening: (Low Dose CT Chest recommended if Age 1-80 years, 30 pack-year currently smoking OR have quit w/in 15years.) does not qualify.      Additional Screening:  Hepatitis C Screening: does qualify; Completed 04/11/2015  Vision Screening: Recommended annual ophthalmology exams for early detection of glaucoma and other disorders of the eye. Is the patient up to date with their annual eye exam?  Yes  Who is the provider or what is the name of the office in which the patient attends annual eye exams?[facility] If pt is not established  with a provider, would they like to be referred to a provider to establish care? No .   Dental Screening: Recommended annual dental exams for proper oral hygiene  Community Resource Referral / Chronic Care Management: CRR required this visit?  No   CCM required this visit?  No  Plan:     I have personally reviewed and noted the following in the patient's chart:   Medical and social history Use of alcohol, tobacco or illicit drugs  Current medications and supplements including opioid prescriptions. Patient is not currently taking opioid prescriptions. Functional ability and status Nutritional status Physical activity Advanced directives List of other physicians Hospitalizations, surgeries, and ER visits in previous 12 months Vitals Screenings to include cognitive, depression, and falls Referrals and appointments  In addition, I have reviewed and discussed with patient certain preventive protocols, quality metrics, and best practice recommendations. A written personalized care plan for preventive services as well as general preventive health recommendations were provided to patient.     Annabell Sabal, CMA   08/14/2022   Nurse Notes: None

## 2022-08-14 NOTE — Patient Instructions (Signed)
Beth Tyler , Thank you for taking time to come for your Medicare Wellness Visit. I appreciate your ongoing commitment to your health goals. Please review the following plan we discussed and let me know if I can assist you in the future.   These are the goals we discussed:  Goals       Patient Stated     Portion control (pt-stated)      Maintain weight Stay active      Other     Patient Stated      Stay heathly        This is a list of the screening recommended for you and due dates:  Health Maintenance  Topic Date Due   DTaP/Tdap/Td vaccine (1 - Tdap) Never done   Zoster (Shingles) Vaccine (1 of 2) 12/30/2022*   Complete foot exam   08/15/2022   Mammogram  08/24/2022   Flu Shot  11/07/2022   Yearly kidney health urinalysis for diabetes  12/29/2022   Hemoglobin A1C  01/29/2023   Eye exam for diabetics  02/16/2023   Yearly kidney function blood test for diabetes  07/29/2023   Medicare Annual Wellness Visit  08/14/2023   Pneumonia Vaccine  Completed   DEXA scan (bone density measurement)  Completed   Hepatitis C Screening: USPSTF Recommendation to screen - Ages 86-79 yo.  Completed   HPV Vaccine  Aged Out   Colon Cancer Screening  Discontinued   COVID-19 Vaccine  Discontinued  *Topic was postponed. The date shown is not the original due date.    Advanced directives: Please bring a copy of your health care power of attorney and living will to the office to be added to your chart at your convenience.   Conditions/risks identified: Aim for 30 minutes of exercise or brisk walking, 6-8 glasses of water, and 5 servings of fruits and vegetables each day.   Next appointment: Follow up in one year for your annual wellness visit   Preventive Care 65 Years and Older, Female Preventive care refers to lifestyle choices and visits with your health care provider that can promote health and wellness. What does preventive care include? A yearly physical exam. This is also called an  annual well check. Dental exams once or twice a year. Routine eye exams. Ask your health care provider how often you should have your eyes checked. Personal lifestyle choices, including: Daily care of your teeth and gums. Regular physical activity. Eating a healthy diet. Avoiding tobacco and drug use. Limiting alcohol use. Practicing safe sex. Taking low-dose aspirin every day. Taking vitamin and mineral supplements as recommended by your health care provider. What happens during an annual well check? The services and screenings done by your health care provider during your annual well check will depend on your age, overall health, lifestyle risk factors, and family history of disease. Counseling  Your health care provider may ask you questions about your: Alcohol use. Tobacco use. Drug use. Emotional well-being. Home and relationship well-being. Sexual activity. Eating habits. History of falls. Memory and ability to understand (cognition). Work and work Astronomer. Reproductive health. Screening  You may have the following tests or measurements: Height, weight, and BMI. Blood pressure. Lipid and cholesterol levels. These may be checked every 5 years, or more frequently if you are over 63 years old. Skin check. Lung cancer screening. You may have this screening every year starting at age 50 if you have a 30-pack-year history of smoking and currently smoke or have quit within  the past 15 years. Fecal occult blood test (FOBT) of the stool. You may have this test every year starting at age 57. Flexible sigmoidoscopy or colonoscopy. You may have a sigmoidoscopy every 5 years or a colonoscopy every 10 years starting at age 33. Hepatitis C blood test. Hepatitis B blood test. Sexually transmitted disease (STD) testing. Diabetes screening. This is done by checking your blood sugar (glucose) after you have not eaten for a while (fasting). You may have this done every 1-3 years. Bone  density scan. This is done to screen for osteoporosis. You may have this done starting at age 106. Mammogram. This may be done every 1-2 years. Talk to your health care provider about how often you should have regular mammograms. Talk with your health care provider about your test results, treatment options, and if necessary, the need for more tests. Vaccines  Your health care provider may recommend certain vaccines, such as: Influenza vaccine. This is recommended every year. Tetanus, diphtheria, and acellular pertussis (Tdap, Td) vaccine. You may need a Td booster every 10 years. Zoster vaccine. You may need this after age 3. Pneumococcal 13-valent conjugate (PCV13) vaccine. One dose is recommended after age 19. Pneumococcal polysaccharide (PPSV23) vaccine. One dose is recommended after age 37. Talk to your health care provider about which screenings and vaccines you need and how often you need them. This information is not intended to replace advice given to you by your health care provider. Make sure you discuss any questions you have with your health care provider. Document Released: 04/21/2015 Document Revised: 12/13/2015 Document Reviewed: 01/24/2015 Elsevier Interactive Patient Education  2017 [employer].  Fall Prevention in the Home Falls can cause injuries. They can happen to people of all ages. There are many things you can do to make your home safe and to help prevent falls. What can I do on the outside of my home? Regularly fix the edges of walkways and driveways and fix any cracks. Remove anything that might make you trip as you walk through a door, such as a raised step or threshold. Trim any bushes or trees on the path to your home. Use bright outdoor lighting. Clear any walking paths of anything that might make someone trip, such as rocks or tools. Regularly check to see if handrails are loose or broken. Make sure that both sides of any steps have handrails. Any raised  decks and porches should have guardrails on the edges. Have any leaves, snow, or ice cleared regularly. Use sand or salt on walking paths during winter. Clean up any spills in your garage right away. This includes oil or grease spills. What can I do in the bathroom? Use night lights. Install grab bars by the toilet and in the tub and shower. Do not use towel bars as grab bars. Use non-skid mats or decals in the tub or shower. If you need to sit down in the shower, use a plastic, non-slip stool. Keep the floor dry. Clean up any water that spills on the floor as soon as it happens. Remove soap buildup in the tub or shower regularly. Attach bath mats securely with double-sided non-slip rug tape. Do not have throw rugs and other things on the floor that can make you trip. What can I do in the bedroom? Use night lights. Make sure that you have a light by your bed that is easy to reach. Do not use any sheets or blankets that are too big for your bed. They should  not hang down onto the floor. Have a firm chair that has side arms. You can use this for support while you get dressed. Do not have throw rugs and other things on the floor that can make you trip. What can I do in the kitchen? Clean up any spills right away. Avoid walking on wet floors. Keep items that you use a lot in easy-to-reach places. If you need to reach something above you, use a strong step stool that has a grab bar. Keep electrical cords out of the way. Do not use floor polish or wax that makes floors slippery. If you must use wax, use non-skid floor wax. Do not have throw rugs and other things on the floor that can make you trip. What can I do with my stairs? Do not leave any items on the stairs. Make sure that there are handrails on both sides of the stairs and use them. Fix handrails that are broken or loose. Make sure that handrails are as long as the stairways. Check any carpeting to make sure that it is firmly attached  to the stairs. Fix any carpet that is loose or worn. Avoid having throw rugs at the top or bottom of the stairs. If you do have throw rugs, attach them to the floor with carpet tape. Make sure that you have a light switch at the top of the stairs and the bottom of the stairs. If you do not have them, ask someone to add them for you. What else can I do to help prevent falls? Wear shoes that: Do not have high heels. Have rubber bottoms. Are comfortable and fit you well. Are closed at the toe. Do not wear sandals. If you use a stepladder: Make sure that it is fully opened. Do not climb a closed stepladder. Make sure that both sides of the stepladder are locked into place. Ask someone to hold it for you, if possible. Clearly mark and make sure that you can see: Any grab bars or handrails. First and last steps. Where the edge of each step is. Use tools that help you move around (mobility aids) if they are needed. These include: Canes. Walkers. Scooters. Crutches. Turn on the lights when you go into a dark area. Replace any light bulbs as soon as they burn out. Set up your furniture so you have a clear path. Avoid moving your furniture around. If any of your floors are uneven, fix them. If there are any pets around you, be aware of where they are. Review your medicines with your doctor. Some medicines can make you feel dizzy. This can increase your chance of falling. Ask your doctor what other things that you can do to help prevent falls. This information is not intended to replace advice given to you by your health care provider. Make sure you discuss any questions you have with your health care provider. Document Released: 01/19/2009 Document Revised: 08/31/2015 Document Reviewed: 04/29/2014 Elsevier Interactive Patient Education  2017 [employer].

## 2022-08-20 ENCOUNTER — Telehealth: Payer: Self-pay

## 2022-08-20 NOTE — Telephone Encounter (Signed)
Faxed to Jill

## 2022-08-20 NOTE — Telephone Encounter (Signed)
Patient called office back, she is on Semaglutide (RYBELSUS) 7 MG TABS .

## 2022-08-20 NOTE — Telephone Encounter (Signed)
LM for patient to return call. Just need to know if she is on Rybelsus 3 mg or 7 mg.

## 2022-08-20 NOTE — Telephone Encounter (Signed)
Signed and placed in box.   

## 2022-08-20 NOTE — Telephone Encounter (Signed)
Noted. Form in folder for your signature for her patient assistance paper work

## 2022-08-22 ENCOUNTER — Telehealth: Payer: Self-pay | Admitting: Pharmacy Technician

## 2022-08-22 DIAGNOSIS — Z5986 Financial insecurity: Secondary | ICD-10-CM

## 2022-08-22 NOTE — Progress Notes (Signed)
[facility])                                            [facility] Quality Pharmacy Team    08/22/2022  Beth Tyler Sep 04, 1945 [id number]  Received both patient and provider portion(s) of patient assistance application(s) for Rybelsus. Faxed completed application and required documents into [employer].  Chrisha Vogel P. [name], CPhT [facility] [employer]  [phone]

## 2022-08-28 ENCOUNTER — Other Ambulatory Visit: Payer: Self-pay | Admitting: Internal Medicine

## 2022-08-28 DIAGNOSIS — Z1231 Encounter for screening mammogram for malignant neoplasm of breast: Secondary | ICD-10-CM

## 2022-09-03 ENCOUNTER — Other Ambulatory Visit: Payer: Medicare HMO | Admitting: Pharmacist

## 2022-09-03 ENCOUNTER — Telehealth: Payer: Self-pay | Admitting: Pharmacy Technician

## 2022-09-03 ENCOUNTER — Telehealth: Payer: Self-pay

## 2022-09-03 DIAGNOSIS — I700 Atherosclerosis of aorta: Secondary | ICD-10-CM

## 2022-09-03 DIAGNOSIS — Z5986 Financial insecurity: Secondary | ICD-10-CM

## 2022-09-03 MED ORDER — RYBELSUS 14 MG PO TABS: 14.0000 mg | ORAL_TABLET | Freq: Every day | ORAL | 1 refills | Status: DC

## 2022-09-03 NOTE — Patient Instructions (Signed)
Latonia,   It was great talking to you today!  We'll work on Scientist, clinical (histocompatibility and immunogenetics) the Rybelsus dose to 14 mg with the patient assistance program.   Continue your current regimen. Our goal is for you to remain within that target range at least 65-70% of the time.   Please reach out with any questions.   Thanks!  Catie Clearance Coots, PharmD

## 2022-09-03 NOTE — Progress Notes (Signed)
09/03/2022 Name: Beth Tyler MRN: [id number] DOB: 10/31/1945  Chief Complaint  Patient presents with   Medication Management   Diabetes    Beth Tyler is a 77 y.o. year old female who presented for a telephone visit.   They were referred to the pharmacist by their PCP for assistance in managing diabetes.   Subjective:  Care Team: Primary Care Provider: Dale , MD ; Next Scheduled Visit: 11/29/22  Medication Access/Adherence  Current Pharmacy:  [facility] [address] [place] - [address] [place] [address] Phone: [phone] Fax: [phone]  [facility] [place], [place] - [address] [place] [address] Phone: [phone] Fax: [phone]  [facility] [address] [place] [address] Phone: [phone] Fax: [phone]   Patient reports affordability concerns with their medications: No  Patient reports access/transportation concerns to their pharmacy: No  Patient reports adherence concerns with their medications:  No    Does note concern with more fatigue and shortness of breath with activity, especially when running errands. This is not an acute change, this has been a gradual change. Notes she cannot go to the store without getting out of breath. Notes O2 sat has been normal. Occasionally feeling symptoms of Afib, but not frequently. Suspects deconditioning (her words are "I'm getting just getting old") but wonders if she should follow up with PCP or cardiology.   Diabetes:  Current medications: Basaglar 22 units, [name] 10-14 units with meals; metformin XR 1000 mg twice daily, [name] 25 mg daily, Rybelsus 7 mg daily   PAP for Rybelsus submitted; has not heard on application yet.   Using [employer]  Date of Download: last 144 Average Glucose: 178 mg/dL - midnight to 6 am: 063 - 6 am to noon:  160 - noon - 6 pm: 185 - 6 pm - midnight: 207 Time in Goal:  - Time in range 70-180: 67% - Time above range: 33% - Time below range: 0%  Patient denies hypoglycemic s/sx including dizziness, shakiness, sweating. Patient denies hyperglycemic symptoms including polyuria, polydipsia, polyphagia, nocturia, neuropathy, blurred vision.  Current meal patterns: 2 meals daily, sometimes large snack  Hyperlipidemia/ASCVD Risk Reduction  Current lipid lowering medications: rosuvastatin 5 mg    Objective:  Lab Results  Component Value Date   HGBA1C 7.4 (A) 07/30/2022    Lab Results  Component Value Date   CREATININE 1.01 07/29/2022   BUN 27 (H) 07/29/2022   NA 140 07/29/2022   K 4.6 07/29/2022   CL 104 07/29/2022   CO2 27 07/29/2022    Lab Results  Component Value Date   CHOL 106 07/29/2022   HDL 51.10 07/29/2022   LDLCALC 34 07/29/2022   LDLDIRECT 74.4 02/21/2012   TRIG 105.0 07/29/2022   CHOLHDL 2 07/29/2022    Medications Reviewed Today     Reviewed by Alden Hipp, RPH-CPP (Pharmacist) on 09/03/22 at 0945  Med List Status: <None>   Medication Order Taking? Sig Documenting Provider Last Dose Status Informant  acetaminophen (TYLENOL) 500 MG tablet [id number]  Take 500-1,000 mg by mouth every 6 (six) hours as needed for mild pain or moderate pain.  [provider]  Active Self           Med Note [name]   Wed Mar 14, 2021 12:58 PM) Taking 1000 mg Q6H  albuterol (VENTOLIN HFA) 108 (90 Base)  MCG/ACT inhaler [id number]  Inhale 2 puffs into the lungs every 4 (four) hours as needed for wheezing or shortness of breath. Valinda Hoar, NP  Active   albuterol (VENTOLIN HFA) 108 (90 Base) MCG/ACT inhaler [id number]  Inhale 1-2 puffs into the lungs every 6 (six) hours as needed for wheezing or shortness of breath. Shirlee Latch, PA-C  Active   amLODipine (NORVASC) 10 MG tablet [id number] Yes Take 1 tablet (10 mg total) by mouth daily. Dale Yuba, MD  Taking Active   Continuous Glucose Sensor (FREESTYLE LIBRE 2 SENSOR) [place] [id number]  USE TO CHECK BLOOD SUGARS AT LEAST 4 TIMES A Donne Anon, MD  Active   ELIQUIS 5 MG TABS tablet [id number] Yes Take 1 tablet by mouth twice daily Dale Loyola, MD Taking Active   empagliflozin ([name]) 25 MG TABS tablet [id number] Yes Take 1 tablet (25 mg total) by mouth daily before breakfast. Dale [place], MD Taking Active   ferrous sulfate 325 (65 FE) MG tablet [id number] Yes Take 325 mg by mouth daily with breakfast. [provider] Taking Active   fluticasone (CUTIVATE) 0.05 % cream [id number]  Apply 1 Application topically as needed. Use as directed. Dale Toco, MD  Active   glucose blood [facility] VERIO) test strip [id number]  USE AS DIRECTED Dale [place], MD  Active   Insulin Glargine [facility] KWIKPEN) 100 UNIT/ML [id number] Yes Inject 22 Units into the skin every morning. Dale [place], MD Taking Active   insulin lispro ([name] KWIKPEN) 100 UNIT/ML KwikPen [id number] Yes Inject 8-14 Units into the skin 3 (three) times daily. Dale [place], MD Taking Active   Insulin Pen Needle (BD PEN NEEDLE NANO 2ND GEN) 32G X 4 MM MISC [id number] Yes USE TO INJECT INSULIN 4 TIMES DAILY Dale [place], MD Taking Active   ipratropium (ATROVENT) 0.06 % nasal spray [id number]  Place 2 sprays into both nostrils 4 (four) times daily. Becky [place], NP  Active   lisinopril (ZESTRIL) 20 MG tablet [id number] Yes Take 1 tablet (20 mg total) by mouth daily. Dale [place], MD Taking Active   MAGnesium-Oxide 400 (240 Mg) MG tablet [id number] Yes Take 1 tablet (400 mg total) by mouth daily. Dale [place], MD Taking Active   metFORMIN (GLUCOPHAGE-XR) 500 MG 24 hr tablet [id number] Yes Take 2 tablets (1,000 mg total) by mouth 2 (two) times daily. Dale [place], MD Taking Active   metoprolol tartrate (LOPRESSOR) 25 MG tablet [id number] Yes Take 25 mg by mouth 2 (two) times daily. [provider]  Taking Active   Multiple Vitamin (MULTIVITAMIN) tablet [id number] Yes Take 1 tablet by mouth daily. [provider] Taking Active   mupirocin ointment (BACTROBAN) 2 % [id number]  Apply 1 application. topically 2 (two) times daily. Dale [place], MD  Active   Pancrelipase, Lip-Prot-Amyl, ([name] PO) [id number] Yes Take by mouth. Take 36,000-72,000 Units by mouth See admin instructions. Take 3 capsules by mouth three times daily with meals and take 2 capsule by mouth daily with snacks [provider] Taking Active   pantoprazole (PROTONIX) 40 MG tablet [id number] Yes TAKE 1 TABLET BY MOUTH TWICE DAILY BEFORE MEAL(S) Dale , MD Taking Active   rosuvastatin (CRESTOR) 5 MG tablet [id number] Yes Take 1 tablet (5 mg total) by mouth daily. Dale , MD Taking Active   Semaglutide [facility]) 7 MG TABS [id number] Yes Take 1 tablet (7 mg total) by mouth daily. Dale , MD Taking Active   triamcinolone cream (KENALOG) 0.1 % [id number]  APPLY  CREAM EXTERNALLY TWICE DAILY  Strength: 0.1 % Dale [place], MD  Active   vitamin B-12 (CYANOCOBALAMIN) 100 MCG tablet [id number] Yes Take 100 mcg by mouth daily. [provider] Taking Active               Assessment/Plan:   Diabetes: - Currently controlled at goal <7.5%. However, patient would appreciate continued benefit from dose increase of Rybelsus to reduce insulin burden.  - Will increase Rybelsus to 14 mg daily, will work with team to send dose increase script to [employer]. Discussed transition to Ozempic given results of FLOW trial published last week, but patient elects to continue Rybelsus.  - Recommend to continue current regimen otherwise. Discussed to monitor post prandial glucoses with increasing Rybelsus dose and decrease [name] as able.    Hyperlipidemia/ASCVD Risk Reduction: - Currently controlled.  - Recommend to continue current regimen at this time   Follow Up Plan: phone call in 6  weeks  Catie TClearance Coots, PharmD, BCACP, CPP [facility] [phone]

## 2022-09-03 NOTE — Telephone Encounter (Signed)
Patient cannot come in at 7:30 in the morning. She says that these issues are not anything new for her and that you are aware of them. She confirmed nothing acute going on and said that she did not feel like appt sooner than her scheduled follow up is necessary

## 2022-09-03 NOTE — Telephone Encounter (Signed)
Patient returned call from Rita Ohara, LPN.  I read Trisha's message to patient.  Patient states she has to take her grandchild to school, so the earliest she can be here is 8 or 8:15am.

## 2022-09-03 NOTE — Assessment & Plan Note (Signed)
Tolerating rosuvastatin. Continue.

## 2022-09-03 NOTE — Telephone Encounter (Signed)
LM to schedule patient with Dr Lorin Picket. Can schedule in the 7:30 09/04/2022

## 2022-09-03 NOTE — Progress Notes (Signed)
[facility])                                            [facility] Quality Pharmacy Team    09/03/2022  Beth Tyler 1945-09-25 [id number]  Care coordination call placed to [employer] in regard to Rybelsus application.   Spoke to Daysha who informs patient is APPROVED 08/23/22-04/08/23. Medication will automatically fill and ship to provider's address on file.  Subsequent refills will auto fill as well. Patient may call [employer] at [phone] if shipment has not arrived and patient does not have sufficient supply.  Shown [place] P. Beth Tyler, CPhT [facility] [employer]  [phone]

## 2022-09-04 NOTE — Progress Notes (Signed)
Thank you.  Beth Tyler contacting her about appt.

## 2022-09-12 ENCOUNTER — Ambulatory Visit
Admission: EM | Admit: 2022-09-12 | Discharge: 2022-09-12 | Disposition: A | Discharge status: Home / Self Care | Payer: Medicare HMO | Attending: Emergency Medicine | Admitting: Emergency Medicine

## 2022-09-12 DIAGNOSIS — L723 Sebaceous cyst: Secondary | ICD-10-CM

## 2022-09-12 DIAGNOSIS — L089 Local infection of the skin and subcutaneous tissue, unspecified: Secondary | ICD-10-CM | POA: Diagnosis not present

## 2022-09-12 MED ORDER — CEPHALEXIN 500 MG PO CAPS: 500.0000 mg | ORAL_CAPSULE | Freq: Three times a day (TID) | ORAL | 0 refills | Status: AC

## 2022-09-12 NOTE — Discharge Instructions (Addendum)
Take the Keflex 3 times a day with food for 7 days for your infected cyst.  Continue to apply warm compresses to help promote drainage.  I have referred you to [facility] for further evaluation and removal of the cyst.  Return for new or worsening symptoms.

## 2022-09-12 NOTE — ED Triage Notes (Signed)
Pt presents for possible abscess on chest. Pt states she has been doing warm compresses and keeping it cover and has started draining but now has another one that popped up beside original one.

## 2022-09-12 NOTE — ED Provider Notes (Signed)
[facility]    CSN: [id number] Arrival date & time: 09/12/22  1048      History   Chief Complaint No chief complaint on file.   HPI Beth Tyler is a 77 y.o. female.   HPI  77 year old female with a past medical history of type 2 diabetes, anemia, GERD, hypertension, and hypercholesterolemia presents for evaluation of questionable abscess on her anterior chest.  She states that it started out as a small bump 1 week ago and then over the last 3 days it is started to drain bloody pus.  It also burns slightly.  Patient denies fevers.  Past Medical History:  Diagnosis Date   Anemia    Arthritis    B12 deficiency    Carpal tunnel syndrome, bilateral    COVID-19 03/18/2019   Diabetes mellitus (HCC) 02/17/2012   Diabetes mellitus, type II (HCC)    Dyspnea    Gastritis    GERD (gastroesophageal reflux disease)    Hypercholesterolemia    Hypertension    IBS (irritable bowel syndrome)    Neuropathy    legs   Pernicious anemia    Vertigo    no episodes fo 9 or 10 yrs    Patient Active Problem List   Diagnosis Date Noted   Congestion of nasal sinus 05/04/2022   Hypoglycemia 09/22/2021   Itching 07/10/2021   Throat clearing 06/24/2021   Decreased GFR 06/21/2021   Thrombophilia (HCC) 08/27/2020   Healthcare maintenance 08/23/2020   Breast cancer screening 07/09/2020   Hypercholesterolemia 08/07/2019   Change in bowel movement 08/07/2019   Benign essential hypertension 04/06/2019   Type 2 diabetes mellitus with diabetic chronic kidney disease (HCC) 04/06/2019   Paroxysmal A-fib (HCC) 04/06/2019   Aortic atherosclerosis (HCC) 10/22/2016   Back pain 05/23/2015   Cough 02/13/2015   Colon cancer screening 01/19/2014    Past Surgical History:  Procedure Laterality Date   ANTERIOR VITRECTOMY Right 12/01/2017   Procedure: ANTERIOR VITRECTOMY;  Surgeon: Nevada Crane, MD;  Location: [facility];  Service: Ophthalmology;  Laterality: Right;    BREAST BIOPSY Right 1980   neg   BREAST EXCISIONAL BIOPSY Right 1980   CATARACT EXTRACTION W/PHACO Left 10/13/2017   Procedure: CATARACT EXTRACTION PHACO AND INTRAOCULAR LENS PLACEMENT (IOC)  LEFT  DIABETES;  Surgeon: Nevada Crane, MD;  Location: [facility];  Service: Ophthalmology;  Laterality: Left;  Diabetic - insulin and oral meds   CATARACT EXTRACTION W/PHACO Right 12/01/2017   Procedure: CATARACT EXTRACTION PHACO AND INTRAOCULAR LENS PLACEMENT (IOC)  RIGHT DIABETIC;  Surgeon: Nevada Crane, MD;  Location: [facility];  Service: Ophthalmology;  Laterality: Right;  Diabetic - insulin and oral   CHOLECYSTECTOMY  1983    OB History   No obstetric history on file.      Home Medications    Prior to Admission medications   Medication Sig Start Date End Date Taking? Authorizing Provider  cephALEXin (KEFLEX) 500 MG capsule Take 1 capsule (500 mg total) by mouth 3 (three) times daily for 7 days. 09/12/22 09/19/22 Yes Becky Augusta, NP  acetaminophen (TYLENOL) 500 MG tablet Take 500-1,000 mg by mouth every 6 (six) hours as needed for mild pain or moderate pain.     [provider]  albuterol (VENTOLIN HFA) 108 (90 Base) MCG/ACT inhaler Inhale 2 puffs into the lungs every 4 (four) hours as needed for wheezing or shortness of breath. 04/16/21   Valinda Hoar, NP  albuterol (VENTOLIN HFA)  108 (90 Base) MCG/ACT inhaler Inhale 1-2 puffs into the lungs every 6 (six) hours as needed for wheezing or shortness of breath. 02/25/22   Eusebio Friendly B, PA-C  amLODipine (NORVASC) 10 MG tablet Take 1 tablet (10 mg total) by mouth daily. 07/30/22   Dale [place], MD  Continuous Glucose Sensor (FREESTYLE LIBRE 2 SENSOR) MISC USE TO CHECK BLOOD SUGARS AT LEAST 4 TIMES A DAY 08/29/22   Dale Tompkins, MD  ELIQUIS 5 MG TABS tablet Take 1 tablet by mouth twice daily 10/16/21   Dale Dowling, MD  empagliflozin ([name]) 25 MG TABS tablet Take 1 tablet (25 mg total) by mouth  daily before breakfast. 12/14/19   Dale Electra, MD  ferrous sulfate 325 (65 FE) MG tablet Take 325 mg by mouth daily with breakfast.    [provider]  fluticasone (CUTIVATE) 0.05 % cream Apply 1 Application topically as needed. Use as directed. 12/28/21   Dale [place], MD  glucose blood [facility] VERIO) test strip USE AS DIRECTED 07/29/22   Dale Geary, MD  Insulin Glargine [facility]) 100 UNIT/ML Inject 22 Units into the skin every morning. 03/14/21   Dale [place], MD  insulin lispro ([name] KWIKPEN) 100 UNIT/ML KwikPen Inject 8-14 Units into the skin 3 (three) times daily. 03/14/21   Dale [place], MD  Insulin Pen Needle (BD PEN NEEDLE NANO 2ND GEN) 32G X 4 MM MISC USE TO INJECT INSULIN 4 TIMES DAILY 12/28/21   Dale [place], MD  ipratropium (ATROVENT) 0.06 % nasal spray Place 2 sprays into both nostrils 4 (four) times daily. 04/12/21   Becky Augusta, NP  lisinopril (ZESTRIL) 20 MG tablet Take 1 tablet (20 mg total) by mouth daily. 07/30/22   Dale [place], MD  MAGnesium-Oxide 400 (240 Mg) MG tablet Take 1 tablet (400 mg total) by mouth daily. 07/30/22   Dale [place], MD  metFORMIN (GLUCOPHAGE-XR) 500 MG 24 hr tablet Take 2 tablets (1,000 mg total) by mouth 2 (two) times daily. 07/30/22   Dale [place], MD  metoprolol tartrate (LOPRESSOR) 25 MG tablet Take 25 mg by mouth 2 (two) times daily.    [provider]  Multiple Vitamin (MULTIVITAMIN) tablet Take 1 tablet by mouth daily.    [provider]  mupirocin ointment (BACTROBAN) 2 % Apply 1 application. topically 2 (two) times daily. 06/21/21   Dale [place], MD  Pancrelipase, Lip-Prot-Amyl, ([name] PO) Take by mouth. Take 36,000-72,000 Units by mouth See admin instructions. Take 3 capsules by mouth three times daily with meals and take 2 capsule by mouth daily with snacks    [provider]  pantoprazole (PROTONIX) 40 MG tablet TAKE 1 TABLET BY MOUTH TWICE DAILY BEFORE MEAL(S) 07/22/22    Dale Oto, MD  rosuvastatin (CRESTOR) 5 MG tablet Take 1 tablet (5 mg total) by mouth daily. 06/05/22   Dale [place], MD  Semaglutide (RYBELSUS) 14 MG TABS Take 1 tablet (14 mg total) by mouth daily. 09/03/22   Dale [place], MD  triamcinolone cream (KENALOG) 0.1 % APPLY  CREAM EXTERNALLY TWICE DAILY Strength: 0.1 % 06/21/22   Dale [place], MD  vitamin B-12 (CYANOCOBALAMIN) 100 MCG tablet Take 100 mcg by mouth daily.    [provider]    Family History Family History  Problem Relation Age of Onset   Diabetes Father    Kidney disease Father    Heart disease Father    Alcohol abuse Mother    Dementia Mother    Diabetes Mother    Diabetes Other  Hemophilia Brother    Uterine cancer Sister    Diabetes Sister    COPD Sister    Breast cancer Maternal Aunt 89   Breast cancer Daughter 69   Stroke Daughter    Diabetes Son     Social History Social History   Tobacco Use   Smoking status: Never   Smokeless tobacco: Never  Vaping Use   Vaping Use: Never used  Substance Use Topics   Alcohol use: No    Alcohol/week: 0.0 standard drinks of alcohol   Drug use: No     Allergies   Hydrocodone, Hydrocodone-acetaminophen, Oxycodone-acetaminophen, Percocet [oxycodone-acetaminophen], Demeclocycline, Other, Penicillins, Tetracyclines & related, and Ampicillin   Review of Systems Review of Systems  Constitutional:  Negative for fever.  Skin:  Positive for color change and wound.     Physical Exam Triage Vital Signs ED Triage Vitals [09/12/22 1059]  Enc Vitals Group     BP (!) 154/64     Pulse Rate 65     Resp      Temp 98.1 F (36.7 C)     Temp Source Oral     SpO2 100 %     Weight      Height      Head Circumference      Peak Flow      Pain Score 2     Pain Loc      Pain Edu?      Excl. in GC?    No data found.  Updated Vital Signs BP (!) 154/64 (BP Location: Left Arm)   Pulse 65   Temp 98.1 F (36.7 C) (Oral)   SpO2 100%   Visual  Acuity Right Eye Distance:   Left Eye Distance:   Bilateral Distance:    Right Eye Near:   Left Eye Near:    Bilateral Near:     Physical Exam Vitals and nursing note reviewed.  Constitutional:      Appearance: Normal appearance. She is not ill-appearing.  HENT:     Head: Normocephalic and atraumatic.  Skin:    General: Skin is warm and dry.     Capillary Refill: Capillary refill takes less than 2 seconds.     Findings: Erythema present.  Neurological:     General: No focal deficit present.     Mental Status: She is alert and oriented to person, place, and time.      UC Treatments / Results  Labs (all labs ordered are listed, but only abnormal results are displayed) Labs Reviewed - No data to display  EKG   Radiology No results found.  Procedures Procedures (including critical care time)  Medications Ordered in UC Medications - No data to display  Initial Impression / Assessment and Plan / UC Course  I have reviewed the triage vital signs and the nursing notes.  Pertinent labs & imaging results that were available during my care of the patient were reviewed by me and considered in my medical decision making (see chart for details).   Patient is a nontoxic-appearing 77 year old female presenting for evaluation of possible abscess on her anterior chest at the apex of her sternum near her sternoclavicular notch.  There is some mild erythema surrounding the spherical lesion under the skin.  With gentle pressure application pus is easily expressed from the lesion.  It is nontender and not hot to touch.  I suspect that this is a sebaceous cyst and given the pus discharge that it is infected.  The patient has multiple medication and antibiotic allergies but states she has taken Keflex in the past without issue.  I will start her on Keflex 500 mg 3 times a day for 7 days for treatment of her infected sebaceous cyst.  I have encouraged her to continue applying warm compresses  to facilitate and promote drainage.  I will also refer her to [facility] for further evaluation and excision of her sebaceous cyst.  Final Clinical Impressions(s) / UC Diagnoses   Final diagnoses:  Infected sebaceous cyst of skin     Discharge Instructions      Take the Keflex 3 times a day with food for 7 days for your infected cyst.  Continue to apply warm compresses to help promote drainage.  I have referred you to [facility] for further evaluation and removal of the cyst.  Return for new or worsening symptoms.     ED Prescriptions     Medication Sig Dispense Auth. Provider   cephALEXin (KEFLEX) 500 MG capsule Take 1 capsule (500 mg total) by mouth 3 (three) times daily for 7 days. 21 capsule Becky Augusta, NP      PDMP not reviewed this encounter.   Becky Augusta, NP 09/12/22 [phone]

## 2022-09-18 ENCOUNTER — Ambulatory Visit: Payer: Medicare HMO

## 2022-09-23 ENCOUNTER — Telehealth: Payer: Self-pay

## 2022-09-23 NOTE — Telephone Encounter (Signed)
LMTCB. Received patient assistance rybelsus. Received 7 mg. Per Catie, patient is to take 2 tablets daily to equal her 14 mg q day dose until her 14 mg dose arrives. Labeled medication and placed up front for pick up

## 2022-10-01 ENCOUNTER — Other Ambulatory Visit: Payer: Self-pay | Admitting: Internal Medicine

## 2022-10-02 ENCOUNTER — Telehealth: Payer: Self-pay

## 2022-10-02 NOTE — Telephone Encounter (Signed)
Patient states she is returning a call from Rita Ohara, LPN.  I read message from Bethann Berkshire to patient.  Patient states she has already picked up the medication.

## 2022-10-02 NOTE — Telephone Encounter (Signed)
4 boxes of rybelsus 14 mg received from patient assistance. LM for patient. Placed up front for pick up

## 2022-10-02 NOTE — Telephone Encounter (Signed)
Pt picked up 7 mg. These are 14 mg. She will come pick up some time this week.

## 2022-10-14 NOTE — Telephone Encounter (Signed)
Patient is going to send her Beth Tyler, Beth Tyler to pick up medication. Planning on picking up on 10/15/2022

## 2022-10-15 ENCOUNTER — Other Ambulatory Visit: Payer: Medicare HMO | Admitting: Pharmacist

## 2022-10-15 DIAGNOSIS — E1140 Type 2 diabetes mellitus with diabetic neuropathy, unspecified: Secondary | ICD-10-CM

## 2022-10-15 MED ORDER — INSULIN LISPRO (1 UNIT DIAL) 100 UNIT/ML (KWIKPEN)
12.0000 [IU] | PEN_INJECTOR | Freq: Three times a day (TID) | SUBCUTANEOUS | 2 refills | Status: AC
Start: 2022-10-15 — End: ?

## 2022-10-15 MED ORDER — RYBELSUS 7 MG PO TABS: 7.0000 mg | ORAL_TABLET | Freq: Every day | ORAL | 1 refills | Status: DC

## 2022-10-15 MED ORDER — BASAGLAR KWIKPEN 100 UNIT/ML ~~LOC~~ SOPN
18.0000 [IU] | PEN_INJECTOR | Freq: Every morning | SUBCUTANEOUS | Status: DC
Start: 2022-10-15 — End: 2022-11-26

## 2022-10-15 NOTE — Telephone Encounter (Signed)
Signed and placed in box.   

## 2022-10-15 NOTE — Telephone Encounter (Signed)
Completed and placed out for you to sign

## 2022-10-15 NOTE — Telephone Encounter (Signed)
Faxed back to [facility].

## 2022-10-15 NOTE — Telephone Encounter (Signed)
Patient will be staying on Rybelsus 7 mg, was unable to tolerate 14 mg dose.   Bethann Berkshire, can you complete this form for 7 mg, #120 (4 month supply) and fax to [employer]?  [email or web address]

## 2022-10-15 NOTE — Patient Instructions (Signed)
Miryah,   Reduce Basaglar to 18 units daily. Increase [name] to 12-16 units with meals, based on carbohydrate content. Continue Rybelsus at 7 mg daily, metformin 1000 mg twice daily, and [name] 25 mg daily.   We'll place an order for the 7 mg tablet.   Take care!  Catie Eppie Gibson, PharmD, BCACP, CPP Clinical Pharmacist [facility] [phone]

## 2022-10-15 NOTE — Progress Notes (Signed)
10/15/2022 Name: Beth Tyler MRN: [id number] DOB: Mar 21, 1946  Chief Complaint  Patient presents with   Medication Management   Diabetes   Hypertension   Hyperlipidemia    Beth Tyler is a 77 y.o. year old female who presented for a telephone visit.   They were referred to the pharmacist by their PCP for assistance in managing diabetes.    Subjective:  Care Team: Primary Care Provider: Dale [place], MD ; Next Scheduled Visit: 12/2022  Medication Access/Adherence  Current Pharmacy:  [facility] [address], [place] - [address] [facility] [address] [place] [address] Phone: [phone] Fax: [phone]  [facility] [place], [place] - [address] [place] [address] Phone: [phone] Fax: [phone]  [facility] [address] [place] [address] Phone: [phone] Fax: [phone]   Patient reports affordability concerns with their medications: No  Patient reports access/transportation concerns to their pharmacy: No  Patient reports adherence concerns with their medications:  No     Diabetes:  Current medications: Rybelsus 7 mg daily, [name] 25 mg daily, metformin 1000 mg twice daily, Basaglar 20 units daily, [name] 8-14 units three times daily (two meals daily, 12-14 units)  Reports diarrhea, abdominal discomfort with Rybelsus 14 mg dose. Currnently using 7 mg dose and believes she is tolerating ok.   Average Glucose: 187 mg/dL - 063/016/010/932 Time in Goal:  - Time in range 70-180: 53% - Time above range: 47% - Time below range: 0%  Patient reports periodic hypoglycemic s/sx including dizziness, shakiness, sweating- periods of 70-80s. Reports this occurs overnight.  Patient denies hyperglycemic symptoms including polyuria, polydipsia, polyphagia, nocturia, neuropathy, blurred vision..  Hypertension:  Current  medications: amlodipine 10 mg daily, lisinopril 20 mg daily, metoprolol tartrate 25 mg twice daily  Patient has a validated, automated, upper arm home BP cuff Current blood pressure readings readings: 120-130s/70-80  Patient denies hypotensive s/sx including dizziness, lightheadedness.  Patient denies hypertensive symptoms including headache, chest pain, shortness of breath  Hyperlipidemia/ASCVD Risk Reduction  Current lipid lowering medications: rosuvastatin 5 mg daily    Objective:  Lab Results  Component Value Date   HGBA1C 7.4 (A) 07/30/2022    Lab Results  Component Value Date   CREATININE 1.01 07/29/2022   BUN 27 (H) 07/29/2022   NA 140 07/29/2022   K 4.6 07/29/2022   CL 104 07/29/2022   CO2 27 07/29/2022    Lab Results  Component Value Date   CHOL 106 07/29/2022   HDL 51.10 07/29/2022   LDLCALC 34 07/29/2022   LDLDIRECT 74.4 02/21/2012   TRIG 105.0 07/29/2022   CHOLHDL 2 07/29/2022    Medications Reviewed Today     Reviewed by Alden Hipp, RPH-CPP (Pharmacist) on 10/15/22 at [phone]  Med List Status: <None>   Medication Order Taking? Sig Documenting Provider Last Dose Status Informant  acetaminophen (TYLENOL) 500 MG tablet [id number]  Take 500-1,000 mg by mouth every 6 (six) hours as needed for mild pain or moderate pain.  [provider]  Active Self           Med Note [name]   Wed Mar 14, 2021 12:58 PM) Taking 1000 mg Q6H  albuterol (VENTOLIN HFA) 108 (90 Base) MCG/ACT inhaler [id number]  Inhale 2 puffs into the lungs every 4 (four) hours as needed for wheezing or shortness of breath. Valinda Hoar, NP  Active  albuterol (VENTOLIN HFA) 108 (90 Base) MCG/ACT inhaler [id number]  Inhale 1-2 puffs into the lungs every 6 (six) hours as needed for wheezing or shortness of breath. Shirlee Latch, PA-C  Active   amLODipine (NORVASC) 10 MG tablet [id number]  Take 1 tablet (10 mg total) by mouth daily. Dale Cumings, MD  Active    Continuous Glucose Sensor (FREESTYLE LIBRE 2 SENSOR) [place] [id number]  USE TO CHECK BLOOD SUGARS AT LEAST 4 TIMES A Donne Anon, MD  Active   ELIQUIS 5 MG TABS tablet [id number]  Take 1 tablet by mouth twice daily Dale Antlers, MD  Active   empagliflozin ([name]) 25 MG TABS tablet [id number] Yes Take 1 tablet (25 mg total) by mouth daily before breakfast. Dale [place], MD Taking Active   ferrous sulfate 325 (65 FE) MG tablet [id number]  Take 325 mg by mouth daily with breakfast. [provider]  Active   fluticasone (CUTIVATE) 0.05 % cream [id number]  Apply 1 Application topically as needed. Use as directed. Dale [place], MD  Active   glucose blood [facility] VERIO) test strip [id number]  USE AS DIRECTED Dale [place], MD  Active   Insulin Glargine [facility] KWIKPEN) 100 UNIT/ML [id number]  Inject 22 Units into the skin every morning. Dale [place], MD  Active   insulin lispro ([name] KWIKPEN) 100 UNIT/ML KwikPen [id number]  Inject 8-14 Units into the skin 3 (three) times daily. Dale Camas, MD  Active   Insulin Pen Needle (BD PEN NEEDLE NANO 2ND GEN) 32G X 4 MM MISC [id number]  USE TO INJECT INSULIN 4 TIMES DAILY Dale Tril[place], MD  Active   ipratropium (ATROVENT) 0.06 % nasal spray [id number]  Place 2 sprays into both nostrils 4 (four) times daily. Becky Augusta, NP  Active   lisinopril (ZESTRIL) 20 MG tablet [id number]  Take 1 tablet (20 mg total) by mouth daily. Dale [place], MD  Active   MAGnesium-Oxide 400 (240 Mg) MG tablet [id number]  Take 1 tablet (400 mg total) by mouth daily. Dale [place], MD  Active   metFORMIN (GLUCOPHAGE-XR) 500 MG 24 hr tablet [id number]  Take 2 tablets (1,000 mg total) by mouth 2 (two) times daily. Dale [place], MD  Active   metoprolol tartrate (LOPRESSOR) 25 MG tablet [id number]  Take 25 mg by mouth 2 (two) times daily. [provider]  Active   Multiple Vitamin (MULTIVITAMIN) tablet [id number]  Take 1 tablet by mouth daily.  [provider]  Active   mupirocin ointment (BACTROBAN) 2 % [id number]  Apply 1 application. topically 2 (two) times daily. Dale Mason, MD  Active   Pancrelipase, Lip-Prot-Amyl, ([name] PO) [id number]  Take by mouth. Take 36,000-72,000 Units by mouth See admin instructions. Take 3 capsules by mouth three times daily with meals and take 2 capsule by mouth daily with snacks [provider]  Active   pantoprazole (PROTONIX) 40 MG tablet [id number]  TAKE 1 TABLET BY MOUTH TWICE DAILY BEFORE MEAL(S) Dale [place], MD  Active   rosuvastatin (CRESTOR) 5 MG tablet [id number]  Take 1 tablet (5 mg total) by mouth daily. Dale Kirkpatrick, MD  Active   Semaglutide [facility]) 7 MG TABS [id number] Yes Take 1 tablet (7 mg total) by mouth daily. Dale [place], MD  Active   triamcinolone cream (KENALOG) 0.1 % [id number]  APPLY TOPICALLY TWICE DAILY Dale , MD  Active   vitamin B-12 (CYANOCOBALAMIN) 100 MCG tablet [id number]  Take 100 mcg by mouth daily. [provider]  Active  Assessment/Plan:   Diabetes: - Currently uncontrolled per home readings; though recommend goal of A1c <8% given medication intolerances  - Continue Rybelsus 7 mg dose, max tolerated dose. Continue metformin XR 1000 mg twice daily, [name] 25 mg daily. Reduce Basaglar to 18 units daily due to periodic overnight hypoglycemia. Given elevated post prandials later in the day, increase [name] to 12-16 units before meals, pending anticipated carbohydrate content. Will collaborate with clinic staff to send reorder to [facility] for 7 mg dose - Recommend to check glucose continuously using CGM   Hypertension: - Currently controlled - Recommend to continue current regimen  Hyperlipidemia/ASCVD Risk Reduction: - Currently controlled.  - Recommend to continue current regimen    Follow Up Plan: phone call in 6 weeks  Catie TClearance Coots, PharmD, BCACP, CPP Clinical Pharmacist [facility] [phone]

## 2022-10-24 ENCOUNTER — Ambulatory Visit
Admission: RE | Admit: 2022-10-24 | Discharge: 2022-10-24 | Disposition: A | Discharge status: Home / Self Care | Payer: Medicare HMO | Source: Ambulatory Visit | Attending: Internal Medicine | Admitting: Internal Medicine

## 2022-10-24 DIAGNOSIS — Z1231 Encounter for screening mammogram for malignant neoplasm of breast: Secondary | ICD-10-CM | POA: Insufficient documentation

## 2022-11-21 ENCOUNTER — Encounter (INDEPENDENT_AMBULATORY_CARE_PROVIDER_SITE_OTHER): Payer: Self-pay

## 2022-11-22 ENCOUNTER — Other Ambulatory Visit: Payer: Self-pay | Admitting: Internal Medicine

## 2022-11-26 ENCOUNTER — Other Ambulatory Visit: Payer: Medicare HMO

## 2022-11-26 ENCOUNTER — Other Ambulatory Visit: Payer: Medicare HMO | Admitting: Pharmacist

## 2022-11-26 DIAGNOSIS — E1140 Type 2 diabetes mellitus with diabetic neuropathy, unspecified: Secondary | ICD-10-CM

## 2022-11-26 MED ORDER — BASAGLAR KWIKPEN 100 UNIT/ML ~~LOC~~ SOPN
22.0000 [IU] | PEN_INJECTOR | Freq: Every morning | SUBCUTANEOUS | Status: DC
Start: 2022-11-26 — End: 2023-10-30

## 2022-11-26 NOTE — Patient Instructions (Signed)
Avry,   It was great talking to you!  We will definitely discontinue Rybelsus. Increase Basaglar back to 22 units daily, continue [name] and [name].   Please reach out with any questions or concerns!  Catie Eppie Gibson, PharmD, BCACP, CPP Clinical Pharmacist [facility] [phone]

## 2022-11-26 NOTE — Progress Notes (Signed)
11/26/2022 Name: Beth Tyler MRN: [id number] DOB: [birth year]  Chief Complaint  Patient presents with   Medication Management   Diabetes    Beth Tyler is a 77 y.o. year old female who presented for a telephone visit.   They were referred to the pharmacist by their PCP for assistance in managing diabetes, hypertension, and hyperlipidemia.    Subjective:  Care Team: Primary Care Provider: Dale Osceola, MD ; Next Scheduled Visit: 12/17/22  Medication Access/Adherence  Current Pharmacy:  [facility] [address] [place] - [address] [place] [address] Phone: [phone] Fax: [phone]  [facility] [place], [place] - [address] [place] [address] Phone: [phone] Fax: [phone]  [facility] [address] [place] [address] Phone: [phone] Fax: [phone]   Patient reports affordability concerns with their medications: No  Patient reports access/transportation concerns to their pharmacy: No  Patient reports adherence concerns with their medications:  No     Diabetes:  Current medications: metformin 1000 mg twice daily, Basaglar 18 units daily, [name] 12-16 units 2-3 times daily, [name] 25 mg daily  Medications tried in the past: stopped Rybelsus due to severe abdominal upset/pain. Lost ~ 7-8 lbs. Describes as "like pancreatitis pain, and I thought I would need to go to the hospital". Reports significant improvement since stopping GLP1.   Current glucose readings: fastings 150-160s; 2 hours post prandials 220-240s since stopping Rybelsus  Patient denies hypoglycemic s/sx including dizziness, shakiness, sweating.   Current medication access support: patient assistance for [name], Basaglar, [name]   Hypertension:  Current medications: amlodipine 10 mg daily, lisinopril 20 mg daily,  metoprolol tartrate 25 mg twice daily    Hyperlipidemia/ASCVD Risk Reduction  Current lipid lowering medications: rosuvastatin 5 mg daily   Objective:  Lab Results  Component Value Date   HGBA1C 7.4 (A) 07/30/2022    Lab Results  Component Value Date   CREATININE 1.01 07/29/2022   BUN 27 (H) 07/29/2022   NA 140 07/29/2022   K 4.6 07/29/2022   CL 104 07/29/2022   CO2 27 07/29/2022    Lab Results  Component Value Date   CHOL 106 07/29/2022   HDL 51.10 07/29/2022   LDLCALC 34 07/29/2022   LDLDIRECT 74.4 02/21/2012   TRIG 105.0 07/29/2022   CHOLHDL 2 07/29/2022    Medications Reviewed Today     Reviewed by Alden Hipp, RPH-CPP (Pharmacist) on 11/26/22 at [phone]  Med List Status: <None>   Medication Order Taking? Sig Documenting Provider Last Dose Status Informant  acetaminophen (TYLENOL) 500 MG tablet [id number] No Take 500-1,000 mg by mouth every 6 (six) hours as needed for mild pain or moderate pain.  [provider] Taking Active Self           Med Note [name]   Wed Mar 14, 2021 12:58 PM) Taking 1000 mg Q6H  albuterol (VENTOLIN HFA) 108 (90 Base) MCG/ACT inhaler [id number] No Inhale 2 puffs into the lungs every 4 (four) hours as needed for wheezing or shortness of breath. Valinda Hoar, NP Taking Active   albuterol (VENTOLIN HFA) 108 (90 Base) MCG/ACT inhaler [id number] No Inhale 1-2 puffs into the lungs every 6 (six) hours as needed for wheezing or shortness of breath. Shirlee Latch, PA-C Taking Active   amLODipine (NORVASC) 10 MG tablet [id number] No Take 1 tablet (10 mg total)  by mouth daily. Dale Chilo, MD Taking Active   Continuous Glucose Sensor (FREESTYLE LIBRE 2 SENSOR) [place] [id number]  USE TO CHECK BLOOD SUGARS AT LEAST 4 TIMES A Donne Anon, MD  Active   ELIQUIS 5 MG TABS tablet [id number] No Take 1 tablet by mouth twice daily Dale [place], MD Taking Active   empagliflozin ([name]) 25 MG TABS tablet [id number] No  Take 1 tablet (25 mg total) by mouth daily before breakfast. Dale [place], MD Taking Active   ferrous sulfate 325 (65 FE) MG tablet [id number] No Take 325 mg by mouth daily with breakfast. [provider] Taking Active   fluticasone (CUTIVATE) 0.05 % cream [id number] No Apply 1 Application topically as needed. Use as directed. Dale [place], MD Taking Active   glucose blood [facility] VERIO) test strip [id number] No USE AS DIRECTED Dale [place], MD Taking Active   Insulin Glargine [facility] KWIKPEN) 100 UNIT/ML [id number]  Inject 18 Units into the skin every morning. Patient assistance Dale [place], MD  Active   insulin lispro ([name] KWIKPEN) 100 UNIT/ML KwikPen [id number]  Inject 12-16 Units into the skin 3 (three) times daily. Patient assistance Dale [place]ley, MD  Active   Insulin Pen Needle (BD PEN NEEDLE NANO 2ND GEN) 32G X 4 MM MISC [id number] No USE TO INJECT INSULIN 4 TIMES DAILY Dale [place], MD Taking Active   ipratropium (ATROVENT) 0.06 % nasal spray [id number] No Place 2 sprays into both nostrils 4 (four) times daily. Becky [place], NP Taking Active   lisinopril (ZESTRIL) 20 MG tablet [id number] No Take 1 tablet (20 mg total) by mouth daily. Dale [place], MD Taking Active   MAGnesium-Oxide 400 (240 Mg) MG tablet [id number] No Take 1 tablet (400 mg total) by mouth daily. Dale [place], MD Taking Active   metFORMIN (GLUCOPHAGE-XR) 500 MG 24 hr tablet [id number] No Take 2 tablets (1,000 mg total) by mouth 2 (two) times daily. Dale [place], MD Taking Active   metoprolol tartrate (LOPRESSOR) 25 MG tablet [id number] No Take 25 mg by mouth 2 (two) times daily. [provider] Taking Active   Multiple Vitamin (MULTIVITAMIN) tablet [id number] No Take 1 tablet by mouth daily. [provider] Taking Active   mupirocin ointment (BACTROBAN) 2 % [id number] No Apply 1 application. topically 2 (two) times daily. Dale [place], MD Taking Active   Pancrelipase,  Lip-Prot-Amyl, ([name] PO) [id number] No Take by mouth. Take 36,000-72,000 Units by mouth See admin instructions. Take 3 capsules by mouth three times daily with meals and take 2 capsule by mouth daily with snacks [provider] Taking Active   pantoprazole (PROTONIX) 40 MG tablet [id number] No TAKE 1 TABLET BY MOUTH TWICE DAILY BEFORE MEAL(S) Dale , MD Taking Active   rosuvastatin (CRESTOR) 5 MG tablet [id number] No Take 1 tablet (5 mg total) by mouth daily. Dale , MD Taking Active   triamcinolone cream (KENALOG) 0.1 % [id number]  APPLY TOPICALLY TWICE DAILY Dale , MD  Active   vitamin B-12 (CYANOCOBALAMIN) 100 MCG tablet [id number] No Take 100 mcg by mouth daily. [provider] Taking Active               Assessment/Plan:   Diabetes: - Currently controlled at more relaxed goal of A1c<8 - Agree to discontinue Rybelsus, would not rechallenge with other GLP1.  - Recommend to increase Basaglar back to 22 units as she was on before addition of Rybelsus. Continue [name], [name].   - Recommend to check glucose using CGM. Follow up with PCP as scheduled.  Hypertension: - Currently controlled - Recommend to continue current regimen   Hyperlipidemia/ASCVD Risk Reduction: - Currently controlled.  - Recommend to continue regimen   Follow Up Plan: PCP in ~ 3 weeks, PharmD 6 weeks after that  Catie Eppie Gibson, PharmD, BCACP, CPP Clinical Pharmacist [facility] [phone]

## 2022-11-27 ENCOUNTER — Telehealth: Payer: Self-pay

## 2022-11-27 NOTE — Telephone Encounter (Signed)
Lvm for pt to call back to see if refill is needed

## 2022-11-27 NOTE — Telephone Encounter (Signed)
Lvm for pt to call back in regards to refill request for kenalog cream

## 2022-11-29 ENCOUNTER — Encounter: Payer: Medicare HMO | Admitting: Internal Medicine

## 2022-12-11 ENCOUNTER — Other Ambulatory Visit: Payer: Medicare HMO

## 2022-12-12 ENCOUNTER — Other Ambulatory Visit (INDEPENDENT_AMBULATORY_CARE_PROVIDER_SITE_OTHER): Payer: Medicare HMO

## 2022-12-12 DIAGNOSIS — Z794 Long term (current) use of insulin: Secondary | ICD-10-CM | POA: Diagnosis not present

## 2022-12-12 DIAGNOSIS — E1122 Type 2 diabetes mellitus with diabetic chronic kidney disease: Secondary | ICD-10-CM

## 2022-12-12 DIAGNOSIS — N183 Chronic kidney disease, stage 3 unspecified: Secondary | ICD-10-CM

## 2022-12-12 DIAGNOSIS — E7800 Pure hypercholesterolemia, unspecified: Secondary | ICD-10-CM | POA: Diagnosis not present

## 2022-12-12 LAB — BASIC METABOLIC PANEL
BUN: 27 mg/dL — ABNORMAL HIGH (ref 6–23)
CO2: 27 meq/L (ref 19–32)
Calcium: 9.6 mg/dL (ref 8.4–10.5)
Chloride: 106 meq/L (ref 96–112)
Creatinine, Ser: 1.24 mg/dL — ABNORMAL HIGH (ref 0.40–1.20)
GFR: 41.77 mL/min — ABNORMAL LOW (ref >60.00)
Glucose, Bld: 89 mg/dL (ref 70–99)
Potassium: 4.7 meq/L (ref 3.5–5.1)
Sodium: 142 meq/L (ref 135–145)

## 2022-12-12 LAB — LIPID PANEL
Cholesterol: 109 mg/dL (ref 0–200)
HDL: 55.7 mg/dL (ref >39.00)
LDL Cholesterol: 14 mg/dL (ref 0–99)
NonHDL: 53.42
Total CHOL/HDL Ratio: 2
Triglycerides: 199 mg/dL — ABNORMAL HIGH (ref 0.0–149.0)
VLDL: 39.8 mg/dL (ref 0.0–40.0)

## 2022-12-12 LAB — CBC WITH DIFFERENTIAL/PLATELET
Basophils Absolute: 0.1 10*3/uL (ref 0.0–0.1)
Basophils Relative: 0.9 % (ref 0.0–3.0)
Eosinophils Absolute: 0.4 10*3/uL (ref 0.0–0.7)
Eosinophils Relative: 5.3 % — ABNORMAL HIGH (ref 0.0–5.0)
HCT: 41.5 % (ref 36.0–46.0)
Hemoglobin: 13.3 g/dL (ref 12.0–15.0)
Lymphocytes Relative: 28.6 % (ref 12.0–46.0)
Lymphs Abs: 2.4 10*3/uL (ref 0.7–4.0)
MCHC: 32 g/dL (ref 30.0–36.0)
MCV: 92.5 fl (ref 78.0–100.0)
Monocytes Absolute: 0.8 10*3/uL (ref 0.1–1.0)
Monocytes Relative: 9.9 % (ref 3.0–12.0)
Neutro Abs: 4.6 10*3/uL (ref 1.4–7.7)
Neutrophils Relative %: 55.3 % (ref 43.0–77.0)
Platelets: 246 10*3/uL (ref 150.0–400.0)
RBC: 4.48 Mil/uL (ref 3.87–5.11)
RDW: 13.7 % (ref 11.5–15.5)
WBC: 8.3 10*3/uL (ref 4.0–10.5)

## 2022-12-12 LAB — HEPATIC FUNCTION PANEL
ALT: 21 U/L (ref 0–35)
AST: 17 U/L (ref 0–37)
Albumin: 4.4 g/dL (ref 3.5–5.2)
Alkaline Phosphatase: 87 U/L (ref 39–117)
Bilirubin, Direct: 0.1 mg/dL (ref 0.0–0.3)
Total Bilirubin: 0.6 mg/dL (ref 0.2–1.2)
Total Protein: 7.4 g/dL (ref 6.0–8.3)

## 2022-12-12 LAB — HEMOGLOBIN A1C: Hgb A1c MFr Bld: 8.5 % — ABNORMAL HIGH (ref 4.6–6.5)

## 2022-12-17 ENCOUNTER — Ambulatory Visit (INDEPENDENT_AMBULATORY_CARE_PROVIDER_SITE_OTHER): Payer: Medicare HMO | Admitting: Internal Medicine

## 2022-12-17 ENCOUNTER — Encounter: Payer: Self-pay | Admitting: Internal Medicine

## 2022-12-17 VITALS — BP 132/72 | HR 57 | Temp 98.2°F | Ht 62.0 in | Wt 189.2 lb

## 2022-12-17 DIAGNOSIS — N183 Chronic kidney disease, stage 3 unspecified: Secondary | ICD-10-CM | POA: Diagnosis not present

## 2022-12-17 DIAGNOSIS — E7800 Pure hypercholesterolemia, unspecified: Secondary | ICD-10-CM | POA: Diagnosis not present

## 2022-12-17 DIAGNOSIS — I480 Paroxysmal atrial fibrillation: Secondary | ICD-10-CM | POA: Diagnosis not present

## 2022-12-17 DIAGNOSIS — Z23 Encounter for immunization: Secondary | ICD-10-CM | POA: Diagnosis not present

## 2022-12-17 DIAGNOSIS — I700 Atherosclerosis of aorta: Secondary | ICD-10-CM

## 2022-12-17 DIAGNOSIS — E1140 Type 2 diabetes mellitus with diabetic neuropathy, unspecified: Secondary | ICD-10-CM | POA: Diagnosis not present

## 2022-12-17 DIAGNOSIS — E1122 Type 2 diabetes mellitus with diabetic chronic kidney disease: Secondary | ICD-10-CM

## 2022-12-17 DIAGNOSIS — Z794 Long term (current) use of insulin: Secondary | ICD-10-CM | POA: Diagnosis not present

## 2022-12-17 DIAGNOSIS — R944 Abnormal results of kidney function studies: Secondary | ICD-10-CM

## 2022-12-17 DIAGNOSIS — I10 Essential (primary) hypertension: Secondary | ICD-10-CM

## 2022-12-17 DIAGNOSIS — Z0000 Encounter for general adult medical examination without abnormal findings: Secondary | ICD-10-CM

## 2022-12-17 DIAGNOSIS — D6859 Other primary thrombophilia: Secondary | ICD-10-CM

## 2022-12-17 MED ORDER — LISINOPRIL 20 MG PO TABS: 20.0000 mg | ORAL_TABLET | Freq: Every day | ORAL | 1 refills | Status: DC

## 2022-12-17 MED ORDER — FREESTYLE LIBRE 2 SENSOR MISC: 5 refills | Status: DC

## 2022-12-17 MED ORDER — AMLODIPINE BESYLATE 10 MG PO TABS: 10.0000 mg | ORAL_TABLET | Freq: Every day | ORAL | 1 refills | Status: DC

## 2022-12-17 MED ORDER — METFORMIN HCL ER 500 MG PO TB24
1000.0000 mg | ORAL_TABLET | Freq: Two times a day (BID) | ORAL | 1 refills | Status: DC
Start: 2022-12-17 — End: 2023-06-27

## 2022-12-17 MED ORDER — APIXABAN 5 MG PO TABS
5.0000 mg | ORAL_TABLET | Freq: Two times a day (BID) | ORAL | 1 refills | Status: DC
Start: 2022-12-17 — End: 2023-06-27

## 2022-12-17 MED ORDER — MAGNESIUM OXIDE -MG SUPPLEMENT 400 (240 MG) MG PO TABS: 1.0000 | ORAL_TABLET | Freq: Every day | ORAL | 1 refills | Status: DC

## 2022-12-17 NOTE — Assessment & Plan Note (Signed)
Physical today 12/17/22.  Mammogram 10/24/22- Birads I.  Colonoscopy 2013 - < 1mm polyp in rectum and diverticulosis.  cologuard 08/29/21 - negative.

## 2022-12-17 NOTE — Progress Notes (Signed)
Subjective:    Patient ID: Beth Tyler, female    DOB: 1946/01/28, 77 y.o.   MRN: [id number]  Patient here for  Chief Complaint  Patient presents with   Annual Exam    HPI Here for her physical exam. Report she is doing relatively well.  Not watching her diet as well.  Discussed diet and exercise. On jardiance, basaglar, humalog, metformin for her diabetes. Had GI issues with 14mg  dose of rybelsus.  Following with Catie.  10/25/22 visit- basaglar decreased to 18 units due to periodic overnight hypoglycemia. [name] was increased before meals. Per review of 11/26/22 f/u note, she stopped her rybelsus due to severe abdominal pain/upset. Post prandial sugars - 220-240s since stopping rybelsus.  No hypoglycemic signs/symptoms. Basaglar increased back to 22 units.  She was eating more fast food.  Was not checking sugars for a brief period due to meter malfunction.  No chest pain or sob reported.  No increased cough or congestion.  Bowels stable.   Past Medical History:  Diagnosis Date   Anemia    Arthritis    B12 deficiency    Carpal tunnel syndrome, bilateral    COVID-19 03/18/2019   Diabetes mellitus (HCC) 02/17/2012   Diabetes mellitus, type II (HCC)    Dyspnea    Gastritis    GERD (gastroesophageal reflux disease)    Hypercholesterolemia    Hypertension    IBS (irritable bowel syndrome)    Neuropathy    legs   Pernicious anemia    Vertigo    no episodes fo 9 or 10 yrs   Past Surgical History:  Procedure Laterality Date   ANTERIOR VITRECTOMY Right 12/01/2017   Procedure: ANTERIOR VITRECTOMY;  Surgeon: Nevada Crane, MD;  [facility];  Service: Ophthalmology;  Laterality: Right;   BREAST BIOPSY Right 1980   neg   BREAST EXCISIONAL BIOPSY Right 1980   CATARACT EXTRACTION W/PHACO Left 10/13/2017   Procedure: CATARACT EXTRACTION PHACO AND INTRAOCULAR LENS PLACEMENT (IOC)  LEFT  DIABETES;  Surgeon: Nevada Crane, MD;  Location: [facility];   Service: Ophthalmology;  Laterality: Left;  Diabetic - insulin and oral meds   CATARACT EXTRACTION W/PHACO Right 12/01/2017   Procedure: CATARACT EXTRACTION PHACO AND INTRAOCULAR LENS PLACEMENT (IOC)  RIGHT DIABETIC;  Surgeon: Nevada Crane, MD;  Location: [facility];  Service: Ophthalmology;  Laterality: Right;  Diabetic - insulin and oral   CHOLECYSTECTOMY  1983   Family History  Problem Relation Age of Onset   Diabetes Father    Kidney disease Father    Heart disease Father    Alcohol abuse Mother    Dementia Mother    Diabetes Mother    Diabetes Other    Hemophilia Brother    Uterine cancer Sister    Diabetes Sister    COPD Sister    Breast cancer Maternal Aunt 37   Breast cancer Daughter 40   Stroke Daughter    Diabetes Son    Social History   Socioeconomic History   Marital status: Widowed    Spouse name: Not on file   Number of children: 4   Years of education: Not on file   Highest education level: Not on file  Occupational History   Not on file  Tobacco Use   Smoking status: Never   Smokeless tobacco: Never  Vaping Use   Vaping status: Never Used  Substance and Sexual Activity   Alcohol use: No    Alcohol/week:  0.0 standard drinks of alcohol   Drug use: No   Sexual activity: Not Currently  Other Topics Concern   Not on file  Social History Narrative   Widowed. Has four children. Manager of an apartment building. No tobacco, alcohol or other drug use   Social Determinants of Health   Financial Resource Strain: Low Risk  (08/14/2022)   Overall Financial Resource Strain (CARDIA)    Difficulty of Paying Living Expenses: Not hard at all  Food Insecurity: No Food Insecurity (08/14/2022)   Hunger Vital Sign    Worried About Running Out of Food in the Last Year: Never true    Ran Out of Food in the Last Year: Never true  Transportation Needs: No Transportation Needs (08/14/2022)   PRAPARE - Administrator, Civil Service (Medical): No     Lack of Transportation (Non-Medical): No  Physical Activity: Insufficiently Active (08/14/2022)   Exercise Vital Sign    Days of Exercise per Week: 7 days    Minutes of Exercise per Session: 20 min  Stress: No Stress Concern Present (08/14/2022)   [employer] of Occupational Health - Occupational Stress Questionnaire    Feeling of Stress : Not at all  Social Connections: Socially Integrated (08/14/2022)   Social Connection and Isolation Panel [NHANES]    Frequency of Communication with Friends and Family: More than three times a week    Frequency of Social Gatherings with Friends and Family: More than three times a week    Attends Religious Services: More than 4 times per year    Active Member of [employer] or Organizations: Yes    Attends Engineer, structural: More than 4 times per year    Marital Status: Married     Review of Systems  Constitutional:  Negative for appetite change and unexpected weight change.  HENT:  Negative for congestion, sinus pressure and sore throat.   Eyes:  Negative for pain and visual disturbance.  Respiratory:  Negative for cough, chest tightness and shortness of breath.   Cardiovascular:  Negative for chest pain and palpitations.  Gastrointestinal:  Negative for abdominal pain, diarrhea, nausea and vomiting.  Genitourinary:  Negative for difficulty urinating and dysuria.  Musculoskeletal:  Negative for joint swelling and myalgias.  Skin:  Negative for color change and rash.  Neurological:  Negative for dizziness and headaches.  Hematological:  Negative for adenopathy. Does not bruise/bleed easily.  Psychiatric/Behavioral:  Negative for agitation and dysphoric mood.        Objective:     BP 132/72   Pulse (!) 57   Temp 98.2 F (36.8 C) (Oral)   Ht 5\' 2"  (1.575 m)   Wt 189 lb 3.2 oz (85.8 kg)   SpO2 98%   BMI 34.61 kg/m  Wt Readings from Last 3 Encounters:  12/17/22 189 lb 3.2 oz (85.8 kg)  08/14/22 191 lb (86.6 kg)  07/30/22 191 lb  (86.6 kg)    Physical Exam Vitals reviewed.  Constitutional:      General: She is not in acute distress.    Appearance: Normal appearance. She is well-developed.  HENT:     Head: Normocephalic and atraumatic.     Right Ear: External ear normal.     Left Ear: External ear normal.  Eyes:     General: No scleral icterus.       Right eye: No discharge.        Left eye: No discharge.     Conjunctiva/sclera: Conjunctivae normal.  Neck:     Thyroid: No thyromegaly.  Cardiovascular:     Rate and Rhythm: Normal rate and regular rhythm.  Pulmonary:     Effort: No tachypnea, accessory muscle usage or respiratory distress.     Breath sounds: Normal breath sounds. No decreased breath sounds or wheezing.  Chest:  Breasts:    Right: No inverted nipple, mass, nipple discharge or tenderness (no axillary adenopathy).     Left: No inverted nipple, mass, nipple discharge or tenderness (no axilarry adenopathy).  Abdominal:     General: Bowel sounds are normal.     Palpations: Abdomen is soft.     Tenderness: There is no abdominal tenderness.  Musculoskeletal:        General: No swelling or tenderness.     Cervical back: Neck supple.  Lymphadenopathy:     Cervical: No cervical adenopathy.  Skin:    Findings: No erythema or rash.  Neurological:     Mental Status: She is alert and oriented to person, place, and time.  Psychiatric:        Mood and Affect: Mood normal.        Behavior: Behavior normal.      Outpatient Encounter Medications as of 12/17/2022  Medication Sig   acetaminophen (TYLENOL) 500 MG tablet Take 500-1,000 mg by mouth every 6 (six) hours as needed for mild pain or moderate pain.    albuterol (VENTOLIN HFA) 108 (90 Base) MCG/ACT inhaler Inhale 2 puffs into the lungs every 4 (four) hours as needed for wheezing or shortness of breath.   albuterol (VENTOLIN HFA) 108 (90 Base) MCG/ACT inhaler Inhale 1-2 puffs into the lungs every 6 (six) hours as needed for wheezing or  shortness of breath.   amLODipine (NORVASC) 10 MG tablet Take 1 tablet (10 mg total) by mouth daily.   apixaban (ELIQUIS) 5 MG TABS tablet Take 1 tablet (5 mg total) by mouth 2 (two) times daily.   Continuous Glucose Sensor (FREESTYLE LIBRE 2 SENSOR) MISC USE TO CHECK BLOOD SUGARS AT LEAST 4 TIMES A DAY   empagliflozin ([name]) 25 MG TABS tablet Take 1 tablet (25 mg total) by mouth daily before breakfast.   ferrous sulfate 325 (65 FE) MG tablet Take 325 mg by mouth daily with breakfast.   fluticasone (CUTIVATE) 0.05 % cream Apply 1 Application topically as needed. Use as directed.   glucose blood (ONETOUCH VERIO) test strip USE AS DIRECTED   Insulin Glargine (BASAGLAR KWIKPEN) 100 UNIT/ML Inject 22 Units into the skin every morning. Patient assistance   insulin lispro ([name] KWIKPEN) 100 UNIT/ML KwikPen Inject 12-16 Units into the skin 3 (three) times daily. Patient assistance   Insulin Pen Needle (BD PEN NEEDLE NANO 2ND GEN) 32G X 4 MM MISC USE TO INJECT INSULIN 4 TIMES DAILY   ipratropium (ATROVENT) 0.06 % nasal spray Place 2 sprays into both nostrils 4 (four) times daily.   lisinopril (ZESTRIL) 20 MG tablet Take 1 tablet (20 mg total) by mouth daily.   MAGnesium-Oxide 400 (240 Mg) MG tablet Take 1 tablet (400 mg total) by mouth daily.   metFORMIN (GLUCOPHAGE-XR) 500 MG 24 hr tablet Take 2 tablets (1,000 mg total) by mouth 2 (two) times daily.   metoprolol tartrate (LOPRESSOR) 25 MG tablet Take 25 mg by mouth 2 (two) times daily.   Multiple Vitamin (MULTIVITAMIN) tablet Take 1 tablet by mouth daily.   mupirocin ointment (BACTROBAN) 2 % Apply 1 application. topically 2 (two) times daily.   Pancrelipase, Lip-Prot-Amyl, ([name] PO)  Take by mouth. Take 36,000-72,000 Units by mouth See admin instructions. Take 3 capsules by mouth three times daily with meals and take 2 capsule by mouth daily with snacks   pantoprazole (PROTONIX) 40 MG tablet TAKE 1 TABLET BY MOUTH TWICE DAILY BEFORE MEAL(S)    rosuvastatin (CRESTOR) 5 MG tablet Take 1 tablet (5 mg total) by mouth daily.   triamcinolone cream (KENALOG) 0.1 % APPLY  CREAM EXTERNALLY TWICE DAILY   vitamin B-12 (CYANOCOBALAMIN) 100 MCG tablet Take 100 mcg by mouth daily.   [DISCONTINUED] amLODipine (NORVASC) 10 MG tablet Take 1 tablet (10 mg total) by mouth daily.   [DISCONTINUED] Continuous Glucose Sensor (FREESTYLE LIBRE 2 SENSOR) MISC USE TO CHECK BLOOD SUGARS AT LEAST 4 TIMES A DAY   [DISCONTINUED] ELIQUIS 5 MG TABS tablet Take 1 tablet by mouth twice daily   [DISCONTINUED] lisinopril (ZESTRIL) 20 MG tablet Take 1 tablet (20 mg total) by mouth daily.   [DISCONTINUED] MAGnesium-Oxide 400 (240 Mg) MG tablet Take 1 tablet (400 mg total) by mouth daily.   [DISCONTINUED] metFORMIN (GLUCOPHAGE-XR) 500 MG 24 hr tablet Take 2 tablets (1,000 mg total) by mouth 2 (two) times daily.   No facility-administered encounter medications on file as of 12/17/2022.     Lab Results  Component Value Date   WBC 8.3 12/12/2022   HGB 13.3 12/12/2022   HCT 41.5 12/12/2022   PLT 246.0 12/12/2022   GLUCOSE 89 12/12/2022   CHOL 109 12/12/2022   TRIG 199.0 (H) 12/12/2022   HDL 55.70 12/12/2022   LDLDIRECT 74.4 02/21/2012   LDLCALC 14 12/12/2022   ALT 21 12/12/2022   AST 17 12/12/2022   NA 142 12/12/2022   K 4.7 12/12/2022   CL 106 12/12/2022   CREATININE 1.24 (H) 12/12/2022   BUN 27 (H) 12/12/2022   CO2 27 12/12/2022   TSH 1.11 07/29/2022   INR 1.2 07/04/2021   HGBA1C 8.5 (H) 12/12/2022   MICROALBUR 2.9 (H) 12/28/2021    MM 3D SCREENING MAMMOGRAM BILATERAL BREAST  Result Date: 10/25/2022 CLINICAL DATA:  Screening. EXAM: DIGITAL SCREENING BILATERAL MAMMOGRAM WITH TOMOSYNTHESIS AND CAD TECHNIQUE: Bilateral screening digital craniocaudal and mediolateral oblique mammograms were obtained. Bilateral screening digital breast tomosynthesis was performed. The images were evaluated with computer-aided detection. COMPARISON:  Previous exam(s). ACR Breast  Density Category b: There are scattered areas of fibroglandular density. FINDINGS: There are no findings suspicious for malignancy. IMPRESSION: No mammographic evidence of malignancy. A result letter of this screening mammogram will be mailed directly to the patient. RECOMMENDATION: Screening mammogram in one year. (Code:SM-B-01Y) BI-RADS CATEGORY  1: Negative. Electronically Signed   By: Amie Portland M.D.   On: 10/25/2022 15:31       Assessment & Plan:  Healthcare maintenance Assessment & Plan: Physical today 12/17/22.  Mammogram 10/24/22- Birads I.  Colonoscopy 2013 - < 1mm polyp in rectum and diverticulosis.  cologuard 08/29/21 - negative.    Paroxysmal A-fib (HCC) Assessment & Plan: Continue metoprolol and eliquis.  Follow.  Stable.   Orders: -     Apixaban; Take 1 tablet (5 mg total) by mouth 2 (two) times daily.  Dispense: 180 tablet; Refill: 1  Type 2 diabetes mellitus with diabetic neuropathy, with long-term current use of insulin (HCC) -     metFORMIN HCl ER; Take 2 tablets (1,000 mg total) by mouth 2 (two) times daily.  Dispense: 360 tablet; Refill: 1  Decreased GFR -     Basic metabolic panel; Future  Encounter for immunization -  Flu Vaccine Trivalent High Dose (Fluad)  Aortic atherosclerosis (HCC) Assessment & Plan: Continue crestor.    Benign essential hypertension Assessment & Plan: Continue metoprolol and amlodipine. On lisinopril 20mg  q day.  Blood pressure here as outlined.   Hold on changing medication. Follow pressures.  Follow metabolic panel.    Hypercholesterolemia Assessment & Plan: Continue crestor.  Low cholesterol diet and exercise.  Follow lipid panel and liver function tests.    Thrombophilia (HCC) Assessment & Plan: afib on eliquis.    Type 2 diabetes mellitus with stage 3 chronic kidney disease, with long-term current use of insulin, unspecified whether stage 3a or 3b CKD (HCC) Assessment & Plan: Not watching her diet as well.  Discussed  diet and exercise. On jardiance, basaglar, humalog, metformin for her diabetes. Had GI issues with 14mg  dose of rybelsus.  Following with Catie.  10/25/22 visit- basaglar decreased to 18 units due to periodic overnight hypoglycemia. [name] was increased before meals. Per review of 11/26/22 f/u note, she stopped her rybelsus due to severe abdominal pain/upset. Post prandial sugars - 220-240s since stopping rybelsus.  No hypoglycemic signs/symptoms. Basaglar increased back to 22 units.  She was eating more fast food.  Was not checking sugars for a brief period due to meter malfunction.Time CGM active 89% with target range 56%, high 33% and very high (mostly night time readings) - 11%.  Given just increased basaglar, will hold on making changes today.  She is going to work on diet and exercise. Follow met b and a1c.    Other orders -     amLODIPine Besylate; Take 1 tablet (10 mg total) by mouth daily.  Dispense: 90 tablet; Refill: 1 -     FreeStyle Libre 2 Sensor; USE TO CHECK BLOOD SUGARS AT LEAST 4 TIMES A DAY  Dispense: 2 each; Refill: 5 -     Lisinopril; Take 1 tablet (20 mg total) by mouth daily.  Dispense: 90 tablet; Refill: 1 -     Magnesium Oxide -Mg Supplement; Take 1 tablet (400 mg total) by mouth daily.  Dispense: 90 tablet; Refill: 1     Dale Forest, MD

## 2022-12-19 ENCOUNTER — Telehealth: Payer: Self-pay | Admitting: Pharmacy Technician

## 2022-12-19 DIAGNOSIS — Z5986 Financial insecurity: Secondary | ICD-10-CM

## 2022-12-19 NOTE — Progress Notes (Signed)
[facility])  [facility] Team   12/19/2022  Beth Tyler [birth year] [id number]  Reason for referral: Medication assistance  Referral source:  patient Current insurance: A[employer]na  Outreach:  Successful telephone call with patient.  HIPAA identifiers verified. Patient informs she is in the doughnut hole and would like to try for assistance with Eliquis. Patient meets income guidelines, has Paramedic and medicaiton is prescribed by Dr. Dale Raceland. It is unsure if she has met the required 3% OOP but patient will go to pharmacy and get a printout. Based on this information, patient will be mailed an application.  Medication Assistance Findings:  Medication assistance needs identified: Eliquis  Extra Help:  Not eligible for Extra Help Low Income Subsidy based on reported income and assets  Additional medication assistance options reviewed with patient as warranted:  No other options identified  Plan: I will route patient assistance letter to [facility] pharmacy technician who will coordinate patient assistance program application process for medications listed above.  [facility] pharmacy technician will assist with obtaining all required documents from both patient and provider(s) and submit application(s) once completed.  Thank you for allowing [facility] pharmacy to be a part of this patient's care.   [name], CPhT [place]  Office: [phone] Fax: [phone] Email: [email or web address]

## 2022-12-22 ENCOUNTER — Encounter: Payer: Self-pay | Admitting: Internal Medicine

## 2022-12-22 NOTE — Assessment & Plan Note (Signed)
Not watching her diet as well.  Discussed diet and exercise. On jardiance, basaglar, humalog, metformin for her diabetes. Had GI issues with 14mg  dose of rybelsus.  Following with Catie.  10/25/22 visit- basaglar decreased to 18 units due to periodic overnight hypoglycemia. [name] was increased before meals. Per review of 11/26/22 f/u note, she stopped her rybelsus due to severe abdominal pain/upset. Post prandial sugars - 220-240s since stopping rybelsus.  No hypoglycemic signs/symptoms. Basaglar increased back to 22 units.  She was eating more fast food.  Was not checking sugars for a brief period due to meter malfunction.Time CGM active 89% with target range 56%, high 33% and very high (mostly night time readings) - 11%.  Given just increased basaglar, will hold on making changes today.  She is going to work on diet and exercise. Follow met b and a1c.

## 2022-12-22 NOTE — Assessment & Plan Note (Signed)
Continue metoprolol and eliquis.  Follow.  Stable.

## 2022-12-22 NOTE — Assessment & Plan Note (Addendum)
Continue crestor 

## 2022-12-22 NOTE — Assessment & Plan Note (Signed)
afib on eliquis.

## 2022-12-22 NOTE — Assessment & Plan Note (Signed)
Continue metoprolol and amlodipine. On lisinopril 20mg  q day.  Blood pressure here as outlined.   Hold on changing medication. Follow pressures.  Follow metabolic panel.

## 2022-12-22 NOTE — Assessment & Plan Note (Signed)
Continue crestor.  Low cholesterol diet and exercise.  Follow lipid panel and liver function tests.  

## 2022-12-27 ENCOUNTER — Telehealth: Payer: Self-pay | Admitting: Pharmacy Technician

## 2022-12-27 DIAGNOSIS — Z5986 Financial insecurity: Secondary | ICD-10-CM

## 2022-12-27 NOTE — Progress Notes (Signed)
[facility])                                            [facility] Quality Pharmacy Team    12/27/2022  Beth Tyler 05-31-1945 [id number]                                      Medication Assistance Referral  Referral From:  Patient self referred  Medication/Company: Eliquis / BMS Patient application portion:  Mailed Provider application portion: Faxed  to Dr. Dale Kingston Provider address/fax verified via: Office website  Beth Tyler, Beth Tyler   Office: [phone] Fax: [phone] Email: [email or web address]

## 2022-12-31 ENCOUNTER — Other Ambulatory Visit: Payer: Self-pay | Admitting: Internal Medicine

## 2023-01-01 MED ORDER — METOPROLOL TARTRATE 25 MG PO TABS: 25.0000 mg | ORAL_TABLET | Freq: Two times a day (BID) | ORAL | 5 refills | Status: DC

## 2023-01-01 NOTE — Telephone Encounter (Signed)
Please confirm with Ms Fullam if she is taking metoprolol and if so, please confirm how she is taking the medication.

## 2023-01-07 ENCOUNTER — Other Ambulatory Visit (INDEPENDENT_AMBULATORY_CARE_PROVIDER_SITE_OTHER): Payer: Medicare HMO

## 2023-01-07 DIAGNOSIS — E1122 Type 2 diabetes mellitus with diabetic chronic kidney disease: Secondary | ICD-10-CM

## 2023-01-07 DIAGNOSIS — R944 Abnormal results of kidney function studies: Secondary | ICD-10-CM | POA: Diagnosis not present

## 2023-01-07 DIAGNOSIS — D6859 Other primary thrombophilia: Secondary | ICD-10-CM | POA: Diagnosis not present

## 2023-01-07 DIAGNOSIS — N183 Chronic kidney disease, stage 3 unspecified: Secondary | ICD-10-CM | POA: Diagnosis not present

## 2023-01-07 DIAGNOSIS — Z794 Long term (current) use of insulin: Secondary | ICD-10-CM

## 2023-01-07 LAB — CBC WITH DIFFERENTIAL/PLATELET
Basophils Absolute: 0.1 10*3/uL (ref 0.0–0.1)
Basophils Relative: 0.9 % (ref 0.0–3.0)
Eosinophils Absolute: 0.4 10*3/uL (ref 0.0–0.7)
Eosinophils Relative: 5.9 % — ABNORMAL HIGH (ref 0.0–5.0)
HCT: 41 % (ref 36.0–46.0)
Hemoglobin: 13.1 g/dL (ref 12.0–15.0)
Lymphocytes Relative: 32.9 % (ref 12.0–46.0)
Lymphs Abs: 2.5 10*3/uL (ref 0.7–4.0)
MCHC: 32 g/dL (ref 30.0–36.0)
MCV: 92.8 fL (ref 78.0–100.0)
Monocytes Absolute: 0.8 10*3/uL (ref 0.1–1.0)
Monocytes Relative: 10.1 % (ref 3.0–12.0)
Neutro Abs: 3.8 10*3/uL (ref 1.4–7.7)
Neutrophils Relative %: 50.2 % (ref 43.0–77.0)
Platelets: 230 10*3/uL (ref 150.0–400.0)
RBC: 4.42 Mil/uL (ref 3.87–5.11)
RDW: 13.1 % (ref 11.5–15.5)
WBC: 7.5 10*3/uL (ref 4.0–10.5)

## 2023-01-07 LAB — BASIC METABOLIC PANEL
BUN: 27 mg/dL — ABNORMAL HIGH (ref 6–23)
CO2: 29 meq/L (ref 19–32)
Calcium: 9.2 mg/dL (ref 8.4–10.5)
Chloride: 106 meq/L (ref 96–112)
Creatinine, Ser: 1.16 mg/dL (ref 0.40–1.20)
GFR: 45.23 mL/min — ABNORMAL LOW (ref >60.00)
Glucose, Bld: 156 mg/dL — ABNORMAL HIGH (ref 70–99)
Potassium: 4.8 meq/L (ref 3.5–5.1)
Sodium: 144 meq/L (ref 135–145)

## 2023-01-07 LAB — HEMOGLOBIN A1C: Hgb A1c MFr Bld: 8.4 % — ABNORMAL HIGH (ref 4.6–6.5)

## 2023-01-21 ENCOUNTER — Other Ambulatory Visit: Payer: Self-pay | Admitting: Internal Medicine

## 2023-01-23 ENCOUNTER — Telehealth: Payer: Self-pay | Admitting: Pharmacy Technician

## 2023-01-23 DIAGNOSIS — Z5986 Financial insecurity: Secondary | ICD-10-CM

## 2023-01-23 NOTE — Progress Notes (Signed)
[facility])                                            [facility] Quality Pharmacy Team    01/23/2023  Beth Tyler 18-Jan-1946 [id number]  Received both patient and provider portion(s) of patient assistance application(s) for Eliquis. Faxed completed application and required documents into BMS.    [name], CPhT [place]  Office: [phone] Fax: [phone] Email: [email or web address]

## 2023-01-31 ENCOUNTER — Telehealth: Payer: Self-pay | Admitting: Pharmacy Technician

## 2023-01-31 DIAGNOSIS — Z5986 Financial insecurity: Secondary | ICD-10-CM

## 2023-01-31 NOTE — Progress Notes (Signed)
[facility])                                            [facility] Quality Pharmacy Team    01/31/2023  Beth Tyler [birth year] [id number]  Care coordination call placed to BMS in regard to Eliquis application.  Spoke to [facility] who informs patient needs to spend $71.97 more to qualify for the program.  Successful outreach to patient. HIPAA verified. Patient informs she just went to pharmacy in the past week and spent some more money (around 200 dollars). She informs she got a printout at that time. She informs she can mail it to me. Provided patient address to send information to. She informs she will place in mail today. Once received, will submit to BMS on patient's behalf.  [name], CPhT [place]  Office: [phone] Fax: [phone] Email: [email or web address]

## 2023-02-11 ENCOUNTER — Other Ambulatory Visit: Payer: Medicare HMO | Admitting: Pharmacist

## 2023-02-11 DIAGNOSIS — E1122 Type 2 diabetes mellitus with diabetic chronic kidney disease: Secondary | ICD-10-CM

## 2023-02-11 NOTE — Patient Instructions (Signed)
[name],   It was a pleasure to speak with you today! As we discussed:?   Continue your Basaglar insulin as you have been taking it, 20 units once daily Continue [name] before meals as you have been taking it except increase your lunch dose by 2 units.  Continue [name] and metformin.  I will check in in 2 weeks, please mentally note what your sugars are running before you eat lunch, and before you eat dinner. This tells [place] how well your [name] is working from the prior meal.   Please reach out prior to your next scheduled appointment should you have any questions or concerns.  Thank you!   Future Appointments  Date Time Provider Department Center  02/25/2023  9:00 AM [facility] PHARMACIST [facility]  03/25/2023  8:45 AM [facility]  03/27/2023 10:30 AM Dale [place], MD [facility]  09/17/2023  8:15 AM Deirdre Evener, MD [facility] None    Loree Fee, PharmD Clinical Pharmacist [facility] [phone]

## 2023-02-11 NOTE — Progress Notes (Signed)
02/11/2023 Name: Beth Tyler MRN: [id number] DOB: 1945-10-12  Subjective  Chief Complaint  Patient presents with   Diabetes   Hypertension   Hyperlipidemia   Reason for visit: Beth Tyler is a 77 y.o. year old female who presented for a telephone visit.   They were referred to the pharmacist by their PCP for assistance in managing diabetes.   Care Team: Primary Care Provider: Dale Rosemont, MD  Reason for visit: ?  Beth Tyler is a 77 y.o. female with a history of diabetes (type 2), who presents today for a follow up diabetes pharmacotherapy visit.? Pertinent PMH also includes AF, HTN, HLD.   Known DM Complications: nephropathy    Date of Last Diabetes Related Visit: with PCP on 12/17/22 and with Pharmacist on 11/26/22   Recent Summary of Changes: ? 9/10: Worsening of diet. Patient plans to work on diet/exercise. No med changes.  8/20: Pt d/c Rybelsus due to severe abdominal pain. PPBG 220-240 since stopping Ryblesus. Basaglar ??22u 7/19: Basaglar ??18u 2/2 overnight hypoglycemia  Medication Access/Adherence: Prescription drug coverage: Payor: AETNA MEDICARE / Plan: AETNA MEDICARE HMO/PPO / Product Type: *No Product type* / .  Reports that all medications are affordable.  Current Patient Assistance:  BMS (Eliquis) BICares ([name]) LillyCares (Basaglar, [name]) NovoCares (Rybelsus, no longer taking) Medication Adherence: Patient denies missing doses of their medication.    Since Last visit / History of Present Illness: ?  Patient reports partially implementing plan from last visit. Continues to feel much better off of Rybelsus. Taking all medications as prescribed except she has been injecting 20 units of Basaglar (did not increase to 22 units as instructed).  Using [name] 8-14 units. Full course meal (bread/veggies/meat/carb side = 14 units). Injects at time of eating, feels BG drops too quickly if injected 15 minutes before.  Reported DM Regimen: ?   Metformin 1000 mg twice daily [name] 25 mg daily Basaglar 20 units daily (Did not increase to 22 units) [name] 8-14 units TIDAC Reports 8 units with breakfast Reports 10-12 units with lunch (largest meal of day) Reports 12/14 units with dinner   DM medications tried in the past:?  Rybelsus (severe abdominal upset/pain)  SMBG  Libre 2 :  Not linked via LibreView Since last visit, patient feels that BG are lower. No sugars in the 300s in the past week. Most PP sugars fall below 200 within 2h of meals.  Average glucose (Last 7 Days): 179 mg/dL Average glucose by time of day:     12 am - 6 am: 150       6 am - 12 pm: 153      12 pm - 6 pm: 188      6 pm - 12 am: 235  Hypo/Hyperglycemia: ?  Symptoms of hypoglycemia since last visit:? yes; 2-3 instances of overnight readings 60s-70s in the past month.  If yes, it was treated by:  hard candy   Symptoms of hyperglycemia since last visit:? no - none  Reported Diet: Patient typically eats 2 meals per day.  Has stopped eating fast food. Working on bringing food with her if she knows she will be out. Has  Breakfast (8:30-9 am): Biscuit, bacon (8 units) Lunch (3-4 pm): Largest meal of the day. Subway sandwich, ham sandwich. 10-12 units Dinner (7 pm): Full meal 3-4 days per week. Other days smaller portion (boiled meat, vegetable). Spaghetti (1-2 times per month), Only veggies she likes cabbage/green beans (12-14 units) Beverages: Avoids sugary drinks  Exercise: None. Reports it is difficult to more (uses walker/cane). Sometime walks around [place].  DM Prevention:  Statin: Taking; moderate intensity.?  History of chronic kidney disease? yes History of albuminuria? yes, last UACR on 12/29/22 = 4.0 mg/g ACE/ARB - Taking lisinopril 20 mg daily; Urine MA/CR Ratio - normal.  Last eye exam: 02/15/22; No retinopathy present Last foot exam: 08/14/2021 Tobacco Use: Never smoker Immunizations:? Flu: Up to date (Last: 12/17/2022);  Pneumococcal: PCV13 (02/2010, 06/2013) PPSV23 (02/2018; received after age 63); Shingrix: No Record - DUE; Covid (07/07/19, DUE)  Cardiovascular Risk Reduction History of clinical ASCVD? no PREVENT Score: 10-year risk of CVD = 31.1%; 10-year risk of ASCVD = 15.5% History of heart failure? no History of hyperlipidemia? yes Current BMI: 34.6 kg/m2 (Ht 62 in, Wt 85.8 kg) Taking statin? yes; moderate intensity (rosuvastatin 5 mg) Taking aspirin? not indicated; Not taking   Taking SGLT-2i? yes Taking GLP- 1 RA? no   Reported HTN Regimen: Lisinopril 20 mg daily Metoprolol tartrate 25 mg twice daily (PAF) Amlodipine 10 mg daily  Patient is checking their blood pressure at home regularly AM and PM.  Current blood pressure readings readings: From memory 120s/70s-80s mmHg. HR lowest = high 40s, low 50s without symptoms.   Patient denies hypotensive s/sx. No dizziness, lightheadedness.  Patient denies hypertensive symptoms. No headache, chest pain, shortness of breath, visual changes.      _______________________________________________  Objective    Review of Systems:? Limited in the setting of virtual visit Constitutional:? No fever, chills or unintentional weight loss  Cardiovascular:? No chest pain or pressure, shortness of breath, dyspnea on exertion, orthopnea or LE edema  Pulmonary:? No cough or shortness of breath  GI:? No nausea, vomiting, constipation, diarrhea, abdominal pain, dyspepsia, change in bowel habits  Endocrine:? No polyuria, polyphagia or blurred vision  Psych:? No depression, anxiety, insomnia    Physical Examination:  Vitals:  Wt Readings from Last 3 Encounters:  12/17/22 189 lb 3.2 oz (85.8 kg)  08/14/22 191 lb (86.6 kg)  07/30/22 191 lb (86.6 kg)   BP Readings from Last 3 Encounters:  12/17/22 132/72  09/12/22 (!) 154/64  07/30/22 128/70   Pulse Readings from Last 3 Encounters:  12/17/22 (!) 57  09/12/22 65  07/30/22 61     Labs:?  Lab Results   Component Value Date   HGBA1C 8.4 (H) 01/07/2023   HGBA1C 8.5 (H) 12/12/2022   HGBA1C 7.4 (A) 07/30/2022   GLUCOSE 156 (H) 01/07/2023   MICRALBCREAT 4.0 12/28/2021   MICRALBCREAT 7.2 11/07/2020   MICRALBCREAT 14.7 07/27/2019   CREATININE 1.16 01/07/2023   CREATININE 1.24 (H) 12/12/2022   CREATININE 1.01 07/29/2022   GFR 45.23 (L) 01/07/2023   GFR 41.77 (L) 12/12/2022   GFR 53.57 (L) 07/29/2022    Lab Results  Component Value Date   CHOL 109 12/12/2022   LDLCALC 14 12/12/2022   LDLCALC 34 07/29/2022   LDLCALC 35 04/29/2022   LDLDIRECT 74.4 02/21/2012   HDL 55.70 12/12/2022   TRIG 199.0 (H) 12/12/2022   TRIG 105.0 07/29/2022   TRIG 99.0 04/29/2022   ALT 21 12/12/2022   ALT 18 07/29/2022   AST 17 12/12/2022   AST 18 07/29/2022      Chemistry      Component Value Date/Time   NA 144 01/07/2023 1009   K 4.8 01/07/2023 1009   CL 106 01/07/2023 1009   CO2 29 01/07/2023 1009   BUN 27 (H) 01/07/2023 1009   CREATININE 1.16 01/07/2023 1009  CREATININE 1.03 (H) 05/29/2022 1000      Component Value Date/Time   CALCIUM 9.2 01/07/2023 1009   ALKPHOS 87 12/12/2022 1049   AST 17 12/12/2022 1049   ALT 21 12/12/2022 1049   BILITOT 0.6 12/12/2022 1049     The ASCVD Risk score (Arnett DK, et al., 2019) failed to calculate for the following reasons:   The valid total cholesterol range is 130 to 320 mg/dL  Assessment and Plan:   1. Diabetes, type 2: uncontrolled per last A1c of 8.4% (01/07/23), unchanged from previous 8.5% (12/12/22) with previously established goal of <8% without hypoglycemia.  CGM data shows avg sugar 179 which approximates to A1c ~7.9%. Current Regimen: metformin 1000 mg BID, [name] 25 mg/d, Basaglar 20 units/d, [name] 8-16 units/day (taking 8-14) Diet: Has cut out fried fast foods and sugary drinks.  Exercise: Limited due to mobility (cane/walker). Discussed goals: Walking through large stores with cart, chair exercises, resistance band moves.   HCM:  Overdue: eye exam (scheduled in November); UACR  Continue Basglar 20 units once daily  Increase Lunch dose of [name] by 2 units Reviewed s/sx/tx hypoglycemia Next A1c due ~04/2023 Future Consideration: GLP1-RA: Avoid given previous intolerance Metformin:  Continue to closely monitor renal function. If eGFR falls below 45 consistently, will need to dose-reduce to 500 mg BID   SU: Defer given risk of hypoglycemia on insulin.  TZD: Avoiding due to possible weight gain/increase in fracture risk.    2. HTN/AFib: controlled based on reported home readings 120s/70s-80s mmHg. Last clinic BP of 132/72 mmHg (12/17/22), goal <130/80 mmHg. Denies lightheadedness, dizziness, SOB, CP, vision changes. Denies any flutters in the past month. HR at home have remained mostly normal. Lowest readings upper 40s/low 50s without symptoms.  Current Regimen: Lisinopril 20 mg/d, metoprolol tartrate BID, amlodipine 10 mg/d Continue medications without changes.    3. ASCVD (primary prevention): LDL at goal on last lipid panel with LDL 17 mg/dL, TG 621 mh/dL (3/0/86). LDL goal <70 mg/dL (primary prevention, diabetes).  Key risk factors include: diabetes, hypertension (well-controlled), hyperlipidemia (well-controlled), BMI >30 kg/m2, and sedentary lifestyle PREVENT Score: 10-year risk of CVD = 31.1%; 10-year risk of ASCVD = 15.5% Current Regimen: rosuvastatin 5 mg daily Continue medications today without changes.    4. Healthcare Maintenance:  Pneumococcal - Current status: Up to date  Shingles - Current status: No record - DUE Influenza - Current status: Up to date for 2024  Due to receive the following vaccines: Shingrix and Covid Booster   Follow Up Follow up with clinical pharmacist via phone in 2 weeks for insulin adjustment Patient given direct line for questions regarding medication therapy  Future Appointments  Date Time Provider Department Center  02/25/2023  9:00 AM [facility]CM PHARMACIST [facility]   03/25/2023  8:45 AM [facility]  03/27/2023 10:30 AM Dale [place], MD [facility]  09/17/2023  8:15 AM Deirdre Evener, MD [facility] None   Loree Fee, PharmD Clinical Pharmacist [facility] [phone]

## 2023-02-13 ENCOUNTER — Telehealth: Payer: Self-pay

## 2023-02-13 ENCOUNTER — Other Ambulatory Visit (HOSPITAL_COMMUNITY): Payer: Self-pay

## 2023-02-13 NOTE — Telephone Encounter (Signed)
-----   Message from Tona Sensing Simcox sent at 01/31/2023 11:02 AM EDT ----- Regarding: patient call about re enrollment for 2025 Hi!  Patient was calling to check on when she needed to start her re enrollment. She is at [employer]. In 2024, she received  [name] from [employer] from [employer] from [employer] from [employer] from [employer]  She is pending with [employer] for 2024 for Eliquis.  She would like a call back to discuss 2025 re enrollment. She is on the PAP spreadsheet that Ronald Pippins sent me.  Thanks, [name], CPhT [place]  Office: [phone] Fax: [phone] Email: [email or web address]

## 2023-02-13 NOTE — Telephone Encounter (Signed)
A user error has taken place: charting done on wrong patient and has been corrected.

## 2023-02-18 DIAGNOSIS — Z961 Presence of intraocular lens: Secondary | ICD-10-CM | POA: Diagnosis not present

## 2023-02-18 DIAGNOSIS — H35372 Puckering of macula, left eye: Secondary | ICD-10-CM | POA: Diagnosis not present

## 2023-02-18 DIAGNOSIS — E119 Type 2 diabetes mellitus without complications: Secondary | ICD-10-CM | POA: Diagnosis not present

## 2023-02-18 LAB — HM DIABETES EYE EXAM

## 2023-02-18 NOTE — Telephone Encounter (Signed)
PAP: PAP application for [name], ([employer] ([employer] Cares)) has been mailed to pt's home address on file. Will fax provider portion of application to provider's office when pt's portion is received.    PLEASED BE ADVISED

## 2023-02-18 NOTE — Telephone Encounter (Signed)
PAP: PAP application for [name], ([name])) has been mailed to pt's home address on file. Will fax provider portion of application to provider's office when pt's portion is received.  PLEASE BE ADVISED

## 2023-02-18 NOTE — Telephone Encounter (Signed)
PAP: PAP application for Basaglar and [name], [employer]) has been mailed to pt's home address on file. Will fax provider portion of application to provider's office when pt's portion is received.   PLEASE BE ADVISED

## 2023-02-25 ENCOUNTER — Other Ambulatory Visit: Payer: Medicare HMO | Admitting: Pharmacist

## 2023-02-25 DIAGNOSIS — E1122 Type 2 diabetes mellitus with diabetic chronic kidney disease: Secondary | ICD-10-CM

## 2023-02-25 NOTE — Patient Instructions (Addendum)
[name],   It was a pleasure to speak with you today! As we discussed:?   Last month when we spoke, your average sugar was at goal, so your current insulin dosing is ideal, though must work toward remembering insulin doses. We discussed using your log book, and possible setting alarms to ensure your doses are taken.    Continue your Basaglar insulin as you have been taking it, 20 units once daily Continue [name] before every meal.  Continue [name] and metformin.   Please reach out prior to your next scheduled appointment should you have any questions or concerns.  Thank you!   Future Appointments  Date Time Provider Department Center  03/25/2023  8:45 AM [facility]  03/27/2023 10:30 AM Dale [place], MD [facility]  04/29/2023  9:00 AM [facility] PHARMACIST [facility]  09/17/2023  8:15 AM Deirdre Evener, MD [facility] None    Loree Fee, PharmD Clinical Pharmacist [facility] [phone]

## 2023-02-25 NOTE — Progress Notes (Signed)
02/25/2023 Name: Beth Tyler MRN: [id number] DOB: October 05, 1945  Subjective  Chief Complaint  Patient presents with   Diabetes   Headache   Reason for visit: Beth Tyler is a 77 y.o. year old female who presented for a telephone visit.   They were referred to the pharmacist by their PCP for assistance in managing diabetes.   Care Team: Primary Care Provider: Dale Midway, MD  Reason for visit: ?  Beth Tyler is a 77 y.o. female with a history of diabetes (type 2), who presents today for a follow up diabetes pharmacotherapy visit.? Pertinent PMH also includes AF, HTN, HLD.   Known DM Complications: nephropathy    Date of Last Diabetes Related Visit: with PCP on 12/17/22 and with Pharmacist on 02/11/23   Recent Summary of Changes: ? 11/5: Improvement in CGM on CGM 7.9%. lunch [name] ??2 units 9/10: Worsening of diet. Patient plans to work on diet/exercise. No med changes.  8/20: Pt d/c Rybelsus due to severe abdominal pain. PPBG 220-240 since stopping Ryblesus. Basaglar ??22u 7/19: Basaglar ??18u 2/2 overnight hypoglycemia  Medication Access/Adherence: Prescription drug coverage: Payor: [employer] MEDICARE / Plan: [employer] MEDICARE HMO/PPO / Product Type: *No Product type* / .  Reports that all medications are affordable.  Current Patient Assistance:  BMS (Eliquis) BI Cares ([name]) LillyCares (Basaglar, [name]) NovoCares (Rybelsus, no longer taking) Medication Adherence: Reports missing 50% of prandial insulin doses over the past month. Occasionally missed basal doses as well.   Since Last visit / History of Present Illness: ?  Patient reports partially implementing plan from last visit. Reports increasing lunchtime dose of [name], though feels she has been less diligent with insulin doses, either forgetting to take, or feeling less motivated to take.   Reported DM Regimen: ?  Metformin 1000 mg twice daily [name] 25 mg daily Basaglar 20 units daily [name]  8-14 units TIDAC B: Reports using 14 units L: Reports using 14 units D: Reports using 10 units  DM medications tried in the past:?  Rybelsus (severe abdominal upset/pain)  SMBG  Libre 2 :  Not linked via LibreView Since last visit, patient feels that BG are high secondary to not using her insulins as regularly.   Average glucose (Last 7 Days): 201 mg/dL  Average glucose by time of day:     12 am - 6 am: 185      6 am - 12 pm: 181      12 pm - 6 pm: 189      6 pm - 12 am: 246  Hypo/Hyperglycemia: ?  Symptoms of hypoglycemia since last visit:? yes; Reports 2 instances of overnight readings 60s-70s in the past month.  If yes, it was treated by:  hard candy   Symptoms of hyperglycemia since last visit:? no - none  Reported Diet:  Has stopped eating fast food. Working on bringing food with her if she knows she will be out. Breakfast (8:30-9 am): Biscuit, bacon Lunch (4 pm): Largest meal of the day. Subway sandwich, ham sandwich. Dinner (7 pm): Eats what is prepared for her. Sometimes boiled meat, vegetable. Spaghetti (1-2 times per month), Only veggies she likes cabbage/green beans Beverages: Avoids sugary drinks  Exercise: None. Reports it is difficult to more (uses walker/cane). Sometime walks around [place].  DM Prevention:  Statin: Taking; moderate intensity.?  History of chronic kidney disease? yes History of albuminuria? yes, last UACR on 12/29/22 = 4.0 mg/g ACE/ARB - Taking lisinopril 20 mg daily; Urine MA/CR Ratio -  normal.  Last eye exam: 02/15/22; No retinopathy present Last foot exam: 08/14/2021 Tobacco Use: Never smoker Immunizations:? Flu: Up to date (Last: 12/17/2022); Pneumococcal: PCV13 (02/2010, 06/2013) PPSV23 (02/2018; received after age 37); Shingrix: No Record - DUE; Covid (07/07/19, DUE)  Cardiovascular Risk Reduction History of clinical ASCVD? no PREVENT Score: 10-year risk of CVD = 31.1%; 10-year risk of ASCVD = 15.5% History of heart failure? no History  of hyperlipidemia? yes Current BMI: 34.6 kg/m2 (Ht 62 in, Wt 85.8 kg) Taking statin? yes; moderate intensity (rosuvastatin 5 mg) Taking aspirin? not indicated; Not taking   Taking SGLT-2i? yes Taking GLP- 1 RA? no   Reported HTN Regimen: Lisinopril 20 mg daily Metoprolol tartrate 25 mg twice daily (PAF) Amlodipine 10 mg daily  Patient is checking their blood pressure at home regularly AM and PM.  Current blood pressure readings readings: From memory 120s/70s-80s mmHg. HR lowest = high 40s, low 50s without symptoms.   Patient denies hypotensive s/sx. No dizziness, lightheadedness.  Patient denies hypertensive symptoms. No headache, chest pain, shortness of breath, visual changes.      _______________________________________________  Objective    Review of Systems:? Limited in the setting of virtual visit Constitutional:? No fever, chills or unintentional weight loss  Cardiovascular:? No chest pain or pressure, shortness of breath, dyspnea on exertion, orthopnea or LE edema  Pulmonary:? No cough or shortness of breath  GI:? No nausea, vomiting, constipation, diarrhea, abdominal pain, dyspepsia, change in bowel habits  Endocrine:? No polyuria, polyphagia or blurred vision  Psych:? No depression, anxiety, insomnia   Physical Examination:  Vitals:  Wt Readings from Last 3 Encounters:  12/17/22 189 lb 3.2 oz (85.8 kg)  08/14/22 191 lb (86.6 kg)  07/30/22 191 lb (86.6 kg)   BP Readings from Last 3 Encounters:  12/17/22 132/72  09/12/22 (!) 154/64  07/30/22 128/70   Pulse Readings from Last 3 Encounters:  12/17/22 (!) 57  09/12/22 65  07/30/22 61     Labs:?  Lab Results  Component Value Date   HGBA1C 8.4 (H) 01/07/2023   HGBA1C 8.5 (H) 12/12/2022   HGBA1C 7.4 (A) 07/30/2022   GLUCOSE 156 (H) 01/07/2023   MICRALBCREAT 4.0 12/28/2021   MICRALBCREAT 7.2 11/07/2020   MICRALBCREAT 14.7 07/27/2019   CREATININE 1.16 01/07/2023   CREATININE 1.24 (H) 12/12/2022   CREATININE  1.01 07/29/2022   GFR 45.23 (L) 01/07/2023   GFR 41.77 (L) 12/12/2022   GFR 53.57 (L) 07/29/2022    Lab Results  Component Value Date   CHOL 109 12/12/2022   LDLCALC 14 12/12/2022   LDLCALC 34 07/29/2022   LDLCALC 35 04/29/2022   LDLDIRECT 74.4 02/21/2012   HDL 55.70 12/12/2022   TRIG 199.0 (H) 12/12/2022   TRIG 105.0 07/29/2022   TRIG 99.0 04/29/2022   ALT 21 12/12/2022   ALT 18 07/29/2022   AST 17 12/12/2022   AST 18 07/29/2022      Chemistry      Component Value Date/Time   NA 144 01/07/2023 1009   K 4.8 01/07/2023 1009   CL 106 01/07/2023 1009   CO2 29 01/07/2023 1009   BUN 27 (H) 01/07/2023 1009   CREATININE 1.16 01/07/2023 1009   CREATININE 1.03 (H) 05/29/2022 1000      Component Value Date/Time   CALCIUM 9.2 01/07/2023 1009   ALKPHOS 87 12/12/2022 1049   AST 17 12/12/2022 1049   ALT 21 12/12/2022 1049   BILITOT 0.6 12/12/2022 1049     The ASCVD Risk score (  Arnett DK, et al., 2019) failed to calculate for the following reasons:   The valid total cholesterol range is 130 to 320 mg/dL  Assessment and Plan:   1. Diabetes, type 2: uncontrolled per last A1c of 8.4% (01/07/23), unchanged from previous 8.5% (12/12/22) with previously established goal of <8% without hypoglycemia. Last month, improvement in avg glucose though with significant worsening today. Avg 179 ? 201 mg/dL which pt reports is 2/2 non-compliance (missing ~50% of insulin injections). Patient continues to report occasional lows. Given good control at last visit, agreed on maintaining current insulin dosing schedule though must work on adherence, especially w lunch/dinner given compounding of hyperglycemia. CGM data shows avg sugar 201; 12am-6 am: 185; 6am-12pm: 181; 12pm-6pm: 189; 6 pm - 12 am: 246 Current Regimen: metformin 1000 mg BID, [name] 25 mg/d, Basaglar 20 units/d, [name] 8-16 units/day Diet: Has cut out fried fast foods and sugary drinks.  Exercise: Limited due to mobility (cane/walker).  Discussed goals: Walking through large stores with cart, chair exercises, resistance band moves.   HCM: Overdue: eye exam (scheduled in November); UACR  Continue medication, no changes today. Emphasized importance of adherence.  Pt reports getting a notebook and plans to start tracking her doses/administration times which she thinks will help her be more motivated/remember her doses.  Reviewed s/sx/tx hypoglycemia Next A1c due ~04/2023 Future Consideration: GLP1-RA: Avoid given previous intolerance Metformin:  Continue to closely monitor renal function. If eGFR falls below 45 consistently, will need to dose-reduce to 500 mg BID   SU: Defer given risk of hypoglycemia on insulin.  TZD: Avoiding due to possible weight gain/increase in fracture risk.    2. HTN/AFib: controlled based on reported home readings 120s/70s-80s mmHg. Last clinic BP of 132/72 mmHg (12/17/22), goal <130/80 mmHg. Denies lightheadedness, dizziness, SOB, CP, vision changes. Denies any flutters in the past month. HR at home have remained mostly normal. Lowest readings upper 40s/low 50s without symptoms.  Current Regimen: Lisinopril 20 mg/d, metoprolol tartrate BID, amlodipine 10 mg/d Continue medications without changes.    3. ASCVD (primary prevention): LDL at goal on last lipid panel with LDL 17 mg/dL, TG 161 mh/dL (0/9/60). LDL goal <70 mg/dL (primary prevention, diabetes).  Key risk factors include: diabetes, hypertension (well-controlled), hyperlipidemia (well-controlled), BMI >30 kg/m2, and sedentary lifestyle PREVENT Score: 10-year risk of CVD = 31.1%; 10-year risk of ASCVD = 15.5% Current Regimen: rosuvastatin 5 mg daily Continue medications today without changes.    4. Healthcare Maintenance:  Pneumococcal - Current status: Up to date  Shingles - Current status: No record - DUE Influenza - Current status: Up to date for 2024  Due to receive the following vaccines: Shingrix and Covid Booster   Follow Up Follow  up with clinical pharmacist via phone in 2 weeks for insulin adjustment Patient given direct line for questions regarding medication therapy  Future Appointments  Date Time Provider Department Center  03/25/2023  8:45 AM [facility]  03/27/2023 10:30 AM Dale , MD [facility]  04/29/2023  9:00 AM [facility] PHARMACIST [facility]  09/17/2023  8:15 AM Deirdre Evener, MD [facility] None   Loree Fee, PharmD Clinical Pharmacist [facility] [phone]

## 2023-03-11 NOTE — Telephone Encounter (Signed)
[facility] Customer service manager [facility])                                            [facility] Quality Pharmacy Team    03/11/2023  Beth Tyler [birth year] [id number]  ADDENDUM  Incoming call received from paitent. HIPAA verified. Patient informed she wants to withdraw her BMS application for 2024 as she had to go to the pharmacy and purchase some Eliquis and since her enrollment would only be good thru 04/08/23, She decided she would withdraw the application.  Will close case as this time.  [name], CPhT [place]  Office: [phone] Fax: [phone] Email: [email or web address]

## 2023-03-12 ENCOUNTER — Other Ambulatory Visit: Payer: Self-pay | Admitting: Internal Medicine

## 2023-03-18 NOTE — Telephone Encounter (Signed)
I HAVE RECEIVED PT PAGES AND FAXED PROVIDER PAGES TO THE PROVIDER OFFICE OF CHARLENE SCOTT  for [name] BI CARES ([employer])   PLEASE BE ADVISED

## 2023-03-18 NOTE — Telephone Encounter (Signed)
I HAVE RECEIVED PT PAGES AND FAXED PROVIDER PAGES TO THE PROVIDER OFFICE OF CHARLENE SCOTT for BASAGLAR [facility] AND [name] KWIKPEN ([name])    PLEASE BE ADVISED

## 2023-03-18 NOTE — Telephone Encounter (Signed)
I HAVE RECEIVED PT PAGES AND FAXED PROVIDER PAGES TO THE PROVIDER OFFICE OF CHARLENE SCOTT  for [name] ([name]).   PLEASE BE ADVISED

## 2023-03-19 ENCOUNTER — Telehealth: Payer: Self-pay | Admitting: Internal Medicine

## 2023-03-19 DIAGNOSIS — E1140 Type 2 diabetes mellitus with diabetic neuropathy, unspecified: Secondary | ICD-10-CM

## 2023-03-19 DIAGNOSIS — E7800 Pure hypercholesterolemia, unspecified: Secondary | ICD-10-CM

## 2023-03-19 NOTE — Telephone Encounter (Signed)
Patient need lab orders.

## 2023-03-25 ENCOUNTER — Other Ambulatory Visit: Payer: Medicare HMO

## 2023-03-25 DIAGNOSIS — E7800 Pure hypercholesterolemia, unspecified: Secondary | ICD-10-CM

## 2023-03-25 DIAGNOSIS — E1140 Type 2 diabetes mellitus with diabetic neuropathy, unspecified: Secondary | ICD-10-CM

## 2023-03-25 DIAGNOSIS — Z794 Long term (current) use of insulin: Secondary | ICD-10-CM | POA: Diagnosis not present

## 2023-03-25 LAB — BASIC METABOLIC PANEL
BUN: 30 mg/dL — ABNORMAL HIGH (ref 6–23)
CO2: 28 meq/L (ref 19–32)
Calcium: 9.2 mg/dL (ref 8.4–10.5)
Chloride: 105 meq/L (ref 96–112)
Creatinine, Ser: 1.13 mg/dL (ref 0.40–1.20)
GFR: 46.6 mL/min — ABNORMAL LOW (ref >60.00)
Glucose, Bld: 109 mg/dL — ABNORMAL HIGH (ref 70–99)
Potassium: 4.5 meq/L (ref 3.5–5.1)
Sodium: 142 meq/L (ref 135–145)

## 2023-03-25 LAB — HEPATIC FUNCTION PANEL
ALT: 13 U/L (ref 0–35)
AST: 15 U/L (ref 0–37)
Albumin: 4.5 g/dL (ref 3.5–5.2)
Alkaline Phosphatase: 84 U/L (ref 39–117)
Bilirubin, Direct: 0.2 mg/dL (ref 0.0–0.3)
Total Bilirubin: 0.7 mg/dL (ref 0.2–1.2)
Total Protein: 6.8 g/dL (ref 6.0–8.3)

## 2023-03-25 LAB — LIPID PANEL
Cholesterol: 130 mg/dL (ref 0–200)
HDL: 60.8 mg/dL (ref >39.00)
LDL Cholesterol: 50 mg/dL (ref 0–99)
NonHDL: 68.77
Total CHOL/HDL Ratio: 2
Triglycerides: 95 mg/dL (ref 0.0–149.0)
VLDL: 19 mg/dL (ref 0.0–40.0)

## 2023-03-25 LAB — HEMOGLOBIN A1C: Hgb A1c MFr Bld: 8.8 % — ABNORMAL HIGH (ref 4.6–6.5)

## 2023-03-25 NOTE — Telephone Encounter (Signed)
PAP: Application for [name] has been submitted to PAP Companies: [name], via faxFaxed application and insurance information to [name]) at [phone]   PLEASE BE ADVISED

## 2023-03-25 NOTE — Progress Notes (Unsigned)
Pharmacy Medication Assistance Program Note    03/25/2023  Patient ID: BRICEIDA ELFRINK, female   DOB: 1945-12-16, 77 y.o.   MRN: [id number]     02/13/2023  Outreach Medication One  Manufacturer Medication One [employer]   [employer] [facility] [name]  Dose of [name] 36000MG   Type of Radiographer, therapeutic Assistance  Date Application Sent to Patient 02/17/2023  Application Items Requested Application;Proof of Income  Name of Prescriber CHARLENE SCOTT     Multiple values from one day are sorted in reverse-chronological order     Signature    03/25/2023  Patient ID: [name], female  DOB: Mar 05, 1946, 77 y.o.  MRN:  [id number]     02/13/2023  Outreach Medication Two  Manufacturer Medication Two [employer]  [employer] [facility] [name]  Dose of [name] 25 MG  Type of Radiographer, therapeutic Assistance  Date Application Sent to Patient 02/17/2023  Application Items Requested Application;Proof of Income  Name of Prescriber Dale Stafford     Signature   03/25/2023  Patient ID: [name], female  DOB: April 22, 1945, 77 y.o.  MRN:  [id number]     02/13/2023  Outreach Medication Three  Manufacturer Medication Three [name] Basaglar  Dose of Basaglar 100U/ML  Dose of [name] 100U/ML  Type of Radiographer, therapeutic Assistance  Date Application Sent to Patient 02/17/2023  Application Items Requested Application;Proof of Income  Name of Prescriber [place], Lorin Picket MD     SIGNATURE

## 2023-03-25 NOTE — Telephone Encounter (Signed)
PAP: Application for Basaglar [name] has been submitted to PAP Companies: [employer]ILLYCARES, via faxFaxed application and insurance information to [employer] at [phone]   PLEASE BE ADVISED

## 2023-03-25 NOTE — Telephone Encounter (Signed)
PAP: Patient assistance application for Basaglar and [name] has been approved by PAP Companies: [name] from 04/09/2023 to 04/07/2024. Medication should be delivered to PAP Delivery: Home For further shipping updates, please contact [name] at [phone] Pt ID is: [id number]   PLEASE BE ADVISED APPROVAL LETTER HAS BEEN SCANNED IN TO MEDIA OF CHART

## 2023-03-25 NOTE — Telephone Encounter (Signed)
PAP: Application for [place] has been submitted to PAP Companies: BICARES, via faxFaxed application and insurance information to [employer]) at [phone]  PLEASE BE ADVISED

## 2023-03-27 ENCOUNTER — Encounter: Payer: Self-pay | Admitting: Internal Medicine

## 2023-03-27 ENCOUNTER — Ambulatory Visit (INDEPENDENT_AMBULATORY_CARE_PROVIDER_SITE_OTHER): Payer: Medicare HMO | Admitting: Internal Medicine

## 2023-03-27 VITALS — BP 116/70 | HR 68 | Temp 97.9°F | Resp 16 | Ht 62.0 in | Wt 190.8 lb

## 2023-03-27 DIAGNOSIS — Z794 Long term (current) use of insulin: Secondary | ICD-10-CM | POA: Diagnosis not present

## 2023-03-27 DIAGNOSIS — E1122 Type 2 diabetes mellitus with diabetic chronic kidney disease: Secondary | ICD-10-CM | POA: Diagnosis not present

## 2023-03-27 DIAGNOSIS — I10 Essential (primary) hypertension: Secondary | ICD-10-CM | POA: Diagnosis not present

## 2023-03-27 DIAGNOSIS — N183 Chronic kidney disease, stage 3 unspecified: Secondary | ICD-10-CM

## 2023-03-27 DIAGNOSIS — E7800 Pure hypercholesterolemia, unspecified: Secondary | ICD-10-CM

## 2023-03-27 DIAGNOSIS — I480 Paroxysmal atrial fibrillation: Secondary | ICD-10-CM | POA: Diagnosis not present

## 2023-03-27 DIAGNOSIS — I700 Atherosclerosis of aorta: Secondary | ICD-10-CM

## 2023-03-27 DIAGNOSIS — D6859 Other primary thrombophilia: Secondary | ICD-10-CM | POA: Diagnosis not present

## 2023-03-27 NOTE — Progress Notes (Signed)
Subjective:    Patient ID: Beth Tyler, female    DOB: [birth year], 77 y.o.   MRN: [id number]  Patient here for  Chief Complaint  Patient presents with   Medical Management of Chronic Issues    HPI Here for a scheduled follow up - follow up regarding diabetes, hypercholesterolemia and hypertension. On jardiance, basaglar, humalog, metformin for her diabetes. Had GI issues with 14mg  dose of rybelsus. Off rybelsus. Seeing Lillia Abed. Reports that she is taking 20 units of basaglar in the morning.  Will take 14 units of humalog with her first meal and then with her evening meal.  Sometimes these meals are only hours apart. Varies. She adjust when she is eating, due to her bowel response after eating. Reviewed CGM report for 03/14/23 - 03/27/23 - 74% active. 65% target range, 24% high and 11% very high. It appears her highest readings are occurring around 5-6pm. No chest pain.  Breathing stable.    Past Medical History:  Diagnosis Date   Anemia    Arthritis    B12 deficiency    Carpal tunnel syndrome, bilateral    COVID-19 03/18/2019   Diabetes mellitus (HCC) 02/17/2012   Diabetes mellitus, type II (HCC)    Dyspnea    Gastritis    GERD (gastroesophageal reflux disease)    Hypercholesterolemia    Hypertension    IBS (irritable bowel syndrome)    Neuropathy    legs   Pernicious anemia    Vertigo    no episodes fo 9 or 10 yrs   Past Surgical History:  Procedure Laterality Date   ANTERIOR VITRECTOMY Right 12/01/2017   Procedure: ANTERIOR VITRECTOMY;  Surgeon: Nevada Crane, MD;  Location: [facility];  Service: Ophthalmology;  Laterality: Right;   BREAST BIOPSY Right 1980   neg   BREAST EXCISIONAL BIOPSY Right 1980   CATARACT EXTRACTION W/PHACO Left 10/13/2017   Procedure: CATARACT EXTRACTION PHACO AND INTRAOCULAR LENS PLACEMENT (IOC)  LEFT  DIABETES;  Surgeon: Nevada Crane, MD;  Location: [facility];  Service: Ophthalmology;  Laterality: Left;   Diabetic - insulin and oral meds   CATARACT EXTRACTION W/PHACO Right 12/01/2017   Procedure: CATARACT EXTRACTION PHACO AND INTRAOCULAR LENS PLACEMENT (IOC)  RIGHT DIABETIC;  Surgeon: Nevada Crane, MD;  Location: [facility];  Service: Ophthalmology;  Laterality: Right;  Diabetic - insulin and oral   CHOLECYSTECTOMY  1983   Family History  Problem Relation Age of Onset   Diabetes Father    Kidney disease Father    Heart disease Father    Alcohol abuse Mother    Dementia Mother    Diabetes Mother    Diabetes Other    Hemophilia Brother    Uterine cancer Sister    Diabetes Sister    COPD Sister    Breast cancer Maternal Aunt 16   Breast cancer Daughter 45   Stroke Daughter    Diabetes Son    Social History   Socioeconomic History   Marital status: Widowed    Spouse name: Not on file   Number of children: 4   Years of education: Not on file   Highest education level: Not on file  Occupational History   Not on file  Tobacco Use   Smoking status: Never   Smokeless tobacco: Never  Vaping Use   Vaping status: Never Used  Substance and Sexual Activity   Alcohol use: No    Alcohol/week: 0.0 standard drinks of alcohol  Drug use: No   Sexual activity: Not Currently  Other Topics Concern   Not on file  Social History Narrative   Widowed. Has four children. Manager of an apartment building. No tobacco, alcohol or other drug use   Social Drivers of Corporate investment banker Strain: Low Risk  (08/14/2022)   Overall Financial Resource Strain (CARDIA)    Difficulty of Paying Living Expenses: Not hard at all  Food Insecurity: No Food Insecurity (08/14/2022)   Hunger Vital Sign    Worried About Running Out of Food in the Last Year: Never true    Ran Out of Food in the Last Year: Never true  Transportation Needs: No Transportation Needs (08/14/2022)   PRAPARE - Administrator, Civil Service (Medical): No    Lack of Transportation (Non-Medical): No   Physical Activity: Insufficiently Active (08/14/2022)   Exercise Vital Sign    Days of Exercise per Week: 7 days    Minutes of Exercise per Session: 20 min  Stress: No Stress Concern Present (08/14/2022)   [employer] of Occupational Health - Occupational Stress Questionnaire    Feeling of Stress : Not at all  Social Connections: Socially Integrated (08/14/2022)   Social Connection and Isolation Panel [NHANES]    Frequency of Communication with Friends and Family: More than three times a week    Frequency of Social Gatherings with Friends and Family: More than three times a week    Attends Religious Services: More than 4 times per year    Active Member of [employer] or Organizations: Yes    Attends Engineer, structural: More than 4 times per year    Marital Status: Married     Review of Systems  Constitutional:  Negative for appetite change and unexpected weight change.  HENT:  Negative for congestion and sinus pressure.   Respiratory:  Negative for cough, chest tightness and shortness of breath.   Cardiovascular:  Negative for chest pain and palpitations.  Gastrointestinal:  Negative for vomiting.       Bowel change and loose stool as outlined.   Genitourinary:  Negative for difficulty urinating and dysuria.  Musculoskeletal:  Negative for joint swelling and myalgias.  Skin:  Negative for color change and rash.  Neurological:  Negative for dizziness and headaches.  Psychiatric/Behavioral:  Negative for agitation and dysphoric mood.        Objective:     BP 116/70   Pulse 68   Temp 97.9 F (36.6 C)   Resp 16   Ht 5\' 2"  (1.575 m)   Wt 190 lb 12.8 oz (86.5 kg)   SpO2 98%   BMI 34.90 kg/m  Wt Readings from Last 3 Encounters:  03/27/23 190 lb 12.8 oz (86.5 kg)  12/17/22 189 lb 3.2 oz (85.8 kg)  08/14/22 191 lb (86.6 kg)    Physical Exam Vitals reviewed.  Constitutional:      General: She is not in acute distress.    Appearance: Normal appearance.  HENT:      Head: Normocephalic and atraumatic.     Right Ear: External ear normal.     Left Ear: External ear normal.     Mouth/Throat:     Pharynx: No oropharyngeal exudate or posterior oropharyngeal erythema.  Eyes:     General: No scleral icterus.       Right eye: No discharge.        Left eye: No discharge.     Conjunctiva/sclera: Conjunctivae normal.  Neck:     Thyroid: No thyromegaly.  Cardiovascular:     Rate and Rhythm: Normal rate and regular rhythm.  Pulmonary:     Effort: No respiratory distress.     Breath sounds: Normal breath sounds. No wheezing.  Abdominal:     General: Bowel sounds are normal.     Palpations: Abdomen is soft.     Tenderness: There is no abdominal tenderness.  Musculoskeletal:        General: No tenderness.     Cervical back: Neck supple. No tenderness.     Comments: Lower extremity swelling - stable.   Lymphadenopathy:     Cervical: No cervical adenopathy.  Skin:    Findings: No erythema or rash.  Neurological:     Mental Status: She is alert.  Psychiatric:        Mood and Affect: Mood normal.        Behavior: Behavior normal.      Outpatient Encounter Medications as of 03/27/2023  Medication Sig   acetaminophen (TYLENOL) 500 MG tablet Take 500-1,000 mg by mouth every 6 (six) hours as needed for mild pain or moderate pain.    albuterol (VENTOLIN HFA) 108 (90 Base) MCG/ACT inhaler Inhale 2 puffs into the lungs every 4 (four) hours as needed for wheezing or shortness of breath.   albuterol (VENTOLIN HFA) 108 (90 Base) MCG/ACT inhaler Inhale 1-2 puffs into the lungs every 6 (six) hours as needed for wheezing or shortness of breath.   amLODipine (NORVASC) 10 MG tablet Take 1 tablet (10 mg total) by mouth daily.   apixaban (ELIQUIS) 5 MG TABS tablet Take 1 tablet (5 mg total) by mouth 2 (two) times daily.   BD PEN NEEDLE NANO 2ND GEN 32G X 4 MM MISC USE 1 PEN NEEDLE 4 TIMES DAILY   Continuous Glucose Sensor (FREESTYLE LIBRE 2 SENSOR) MISC USE TO CHECK  BLOOD SUGARS AT LEAST 4 TIMES A DAY   empagliflozin ([name]) 25 MG TABS tablet Take 1 tablet (25 mg total) by mouth daily before breakfast.   ferrous sulfate 325 (65 FE) MG tablet Take 325 mg by mouth daily with breakfast.   fluticasone (CUTIVATE) 0.05 % cream Apply 1 Application topically as needed. Use as directed.   glucose blood (ONETOUCH VERIO) test strip USE AS DIRECTED   Insulin Glargine (BASAGLAR KWIKPEN) 100 UNIT/ML Inject 22 Units into the skin every morning. Patient assistance   insulin lispro ([name] KWIKPEN) 100 UNIT/ML KwikPen Inject 12-16 Units into the skin 3 (three) times daily. Patient assistance   ipratropium (ATROVENT) 0.06 % nasal spray Place 2 sprays into both nostrils 4 (four) times daily.   lisinopril (ZESTRIL) 20 MG tablet Take 1 tablet (20 mg total) by mouth daily.   MAGnesium-Oxide 400 (240 Mg) MG tablet Take 1 tablet (400 mg total) by mouth daily.   metFORMIN (GLUCOPHAGE-XR) 500 MG 24 hr tablet Take 2 tablets (1,000 mg total) by mouth 2 (two) times daily.   metoprolol tartrate (LOPRESSOR) 25 MG tablet Take 1 tablet (25 mg total) by mouth 2 (two) times daily. Take 25 mg by mouth 2 (two) times daily.   Multiple Vitamin (MULTIVITAMIN) tablet Take 1 tablet by mouth daily.   mupirocin ointment (BACTROBAN) 2 % Apply 1 application. topically 2 (two) times daily.   Pancrelipase, Lip-Prot-Amyl, ([name] PO) Take by mouth. Take 36,000-72,000 Units by mouth See admin instructions. Take 3 capsules by mouth three times daily with meals and take 2 capsule by mouth daily with snacks  pantoprazole (PROTONIX) 40 MG tablet TAKE 1 TABLET BY MOUTH TWICE DAILY BEFORE MEAL(S)   rosuvastatin (CRESTOR) 5 MG tablet Take 1 tablet (5 mg total) by mouth daily.   triamcinolone cream (KENALOG) 0.1 % APPLY  CREAM EXTERNALLY TWICE DAILY   vitamin B-12 (CYANOCOBALAMIN) 100 MCG tablet Take 100 mcg by mouth daily.   No facility-administered encounter medications on file as of 03/27/2023.     Lab  Results  Component Value Date   WBC 7.5 01/07/2023   HGB 13.1 01/07/2023   HCT 41.0 01/07/2023   PLT 230.0 01/07/2023   GLUCOSE 109 (H) 03/25/2023   CHOL 130 03/25/2023   TRIG 95.0 03/25/2023   HDL 60.80 03/25/2023   LDLDIRECT 74.4 02/21/2012   LDLCALC 50 03/25/2023   ALT 13 03/25/2023   AST 15 03/25/2023   NA 142 03/25/2023   K 4.5 03/25/2023   CL 105 03/25/2023   CREATININE 1.13 03/25/2023   BUN 30 (H) 03/25/2023   CO2 28 03/25/2023   TSH 1.11 07/29/2022   INR 1.2 07/04/2021   HGBA1C 8.8 (H) 03/25/2023   MICROALBUR 2.9 (H) 12/28/2021    MM 3D SCREENING MAMMOGRAM BILATERAL BREAST Result Date: 10/25/2022 CLINICAL DATA:  Screening. EXAM: DIGITAL SCREENING BILATERAL MAMMOGRAM WITH TOMOSYNTHESIS AND CAD TECHNIQUE: Bilateral screening digital craniocaudal and mediolateral oblique mammograms were obtained. Bilateral screening digital breast tomosynthesis was performed. The images were evaluated with computer-aided detection. COMPARISON:  Previous exam(s). ACR Breast Density Category b: There are scattered areas of fibroglandular density. FINDINGS: There are no findings suspicious for malignancy. IMPRESSION: No mammographic evidence of malignancy. A result letter of this screening mammogram will be mailed directly to the patient. RECOMMENDATION: Screening mammogram in one year. (Code:SM-B-01Y) BI-RADS CATEGORY  1: Negative. Electronically Signed   By: Amie Portland M.D.   On: 10/25/2022 15:31       Assessment & Plan:  Type 2 diabetes mellitus with stage 3 chronic kidney disease, with long-term current use of insulin, unspecified whether stage 3a or 3b CKD (HCC) Assessment & Plan: Discussed diet and exercise. On jardiance, basaglar, humalog, metformin for her diabetes. Had GI issues with 14mg  dose of rybelsus.  10/25/22 visit- basaglar decreased to 18 units due to periodic overnight hypoglycemia. [name] was increased before meals. Per review of 11/26/22 f/u note, she stopped her rybelsus  due to severe abdominal pain/upset. Post prandial sugars - 220-240s since stopping rybelsus.  No hypoglycemic signs/symptoms. Basaglar increased back to 22 units.  She was eating more fast food.  Was not checking sugars for a brief period due to meter malfunction.Time CGM report as outlined.  She is going to work on diet and exercise. GI issues have been chronic for her. Does not appear to be related to metformin.  She has to adjust her eating, due to her activities for the day - to avoid loose stool/accidents while away from home.  This affects her taking her insulin. Given have tried multiple medications and variations of insulin dosing, discussed possible insulin pump and evaluation by endocrinology.  She is agreeable for referral.   Orders: -     Ambulatory referral to Endocrinology  [facility]) Assessment & Plan: afib on eliquis.    Paroxysmal A-fib (HCC) Assessment & Plan: Continue metoprolol and eliquis.  Follow.  Stable.    Hypercholesterolemia Assessment & Plan: Continue crestor.  Low cholesterol diet and exercise.  Follow lipid panel and liver function tests.    Benign essential hypertension Assessment & Plan: Continue metoprolol and amlodipine. On  lisinopril 20mg  q day.  Blood pressure here as outlined.   Hold on changing medication. Follow pressures.  Follow metabolic panel.    Aortic atherosclerosis (HCC) Assessment & Plan: Continue crestor.       Dale Blennerhassett, MD

## 2023-03-30 ENCOUNTER — Encounter: Payer: Self-pay | Admitting: Internal Medicine

## 2023-03-30 NOTE — Assessment & Plan Note (Signed)
Continue crestor 

## 2023-03-30 NOTE — Assessment & Plan Note (Signed)
afib on eliquis.  

## 2023-03-30 NOTE — Assessment & Plan Note (Signed)
Continue metoprolol and amlodipine. On lisinopril 20mg  q day.  Blood pressure here as outlined.   Hold on changing medication. Follow pressures.  Follow metabolic panel.

## 2023-03-30 NOTE — Assessment & Plan Note (Signed)
Continue metoprolol and eliquis.  Follow.  Stable.  

## 2023-03-30 NOTE — Assessment & Plan Note (Signed)
Discussed diet and exercise. On jardiance, basaglar, humalog, metformin for her diabetes. Had GI issues with 14mg  dose of rybelsus.  10/25/22 visit- basaglar decreased to 18 units due to periodic overnight hypoglycemia. [name] was increased before meals. Per review of 11/26/22 f/u note, she stopped her rybelsus due to severe abdominal pain/upset. Post prandial sugars - 220-240s since stopping rybelsus.  No hypoglycemic signs/symptoms. Basaglar increased back to 22 units.  She was eating more fast food.  Was not checking sugars for a brief period due to meter malfunction.Time CGM report as outlined.  She is going to work on diet and exercise. GI issues have been chronic for her. Does not appear to be related to metformin.  She has to adjust her eating, due to her activities for the day - to avoid loose stool/accidents while away from home.  This affects her taking her insulin. Given have tried multiple medications and variations of insulin dosing, discussed possible insulin pump and evaluation by endocrinology.  She is agreeable for referral.

## 2023-03-30 NOTE — Assessment & Plan Note (Signed)
Continue crestor.  Low cholesterol diet and exercise.  Follow lipid panel and liver function tests.  

## 2023-04-21 NOTE — Telephone Encounter (Signed)
 PAP: Patient assistance application [name]  for has been approved by PAP Companies: [name] from 04/17/2023 to 04/07/2024. Medication should be delivered to PAP Delivery: Home For further shipping updates, please contact [name] ([employer]) at [phone] Pt ID is: NO ID    PLEASE BE ADVISED LETTER OF APPROVAL IN MEDIA OF CHART

## 2023-04-29 ENCOUNTER — Other Ambulatory Visit (INDEPENDENT_AMBULATORY_CARE_PROVIDER_SITE_OTHER): Payer: Medicare HMO | Admitting: Pharmacist

## 2023-04-29 ENCOUNTER — Encounter: Payer: Self-pay | Admitting: Pharmacist

## 2023-04-29 DIAGNOSIS — Z794 Long term (current) use of insulin: Secondary | ICD-10-CM

## 2023-04-29 DIAGNOSIS — E1122 Type 2 diabetes mellitus with diabetic chronic kidney disease: Secondary | ICD-10-CM

## 2023-04-29 DIAGNOSIS — N183 Chronic kidney disease, stage 3 unspecified: Secondary | ICD-10-CM

## 2023-04-29 NOTE — Progress Notes (Signed)
04/29/2023 Name: Beth Tyler MRN: [id number] DOB: October 22, 1945  Subjective  Chief Complaint  Patient presents with   Diabetes   Reason for visit: ?  Beth Tyler is a 78 y.o. female with a history of diabetes (type 2), who presents today for a follow up diabetes pharmacotherapy visit.? Pertinent PMH also includes AF, HTN, HLD.   They were referred to the pharmacist by their PCP for assistance in managing diabetes.   Care Team: Primary Care Provider: Dale Polson, MD  Known DM Complications: nephropathy    Date of Last Diabetes Related Visit: with PCP on 03/27/23 and with Pharmacist on 02/25/23   Recent Summary of Changes: ? 12/19: CGM report for 03/14/23 - 03/27/23 - 74% active. 65% target range, 24% high and 11% very high. It appears her highest readings are occurring around 5-6pm. 11/19: Reports missing 50% of insulin injections. Avg BG increased. Purchased notebook for tracking doses to help her w compliance.  11/5: Improvement in CGM on CGM 7.9%. lunch [name] ??2 units 9/10: Worsening of diet. Patient plans to work on diet/exercise. No med changes.  8/20: Pt d/c Rybelsus due to severe abdominal pain. PPBG 220-240 since stopping Ryblesus. Basaglar ??22u 7/19: Basaglar ??18u 2/2 overnight hypoglycemia  Medication Access/Adherence: Prescription drug coverage: Payor: AETNA MEDICARE / Plan: AETNA MEDICARE HMO/PPO / Product Type: *No Product type* / .  Reports that all medications are affordable.  Current Patient Assistance:  BMS (Eliquis) - Filled for $150 (no deductible, pays 25% of medication cost. Feels this is feasible) Pt repots 3% of income ~$900 BI Cares ([name]) - Approved and has received her first shipment in 2025 LillyCares (Basaglar, [name]) - Approved but has not received shipment  (Rybelsus, no longer taking) Medication Adherence: Nov 2024 reported missing 50% of prandial insulin doses. Today reports improved adherence.   Since Last visit / History of  Present Illness: ?  Patient reports implementing plan from last visit. Confirms discussion w Dr Arline Asp regarding Endo referral. Endocrinology has not reached out yet at this time. Notes missing some insulin doses, but adherence has significantly improved from previous visit.   Notes bruising/hard knots on either size of naval from ~15 years of insulin injections. Reports injection in thigh is too painful. Is under impression that abdomen injections must be 2 inches from naval (and no further) so injections have been concentrated to this small area for ~15 years.   Reported DM Regimen: ?  Metformin 1000 mg twice daily [name] 25 mg daily Basaglar 20 units daily [name] 8-14 units TIDAC B: Reports using 14 units L: Reports using 14 units D: Reports using 10 units  DM medications tried in the past:?  Rybelsus (severe abdominal upset/pain)  SMBG  Libre 2 :  Not linked via LibreView - Has had several for years though <5.  Medicare will pay for new reader every ~5 years. Reports she has a smart phone (Android) but has been told in the past that the app is not compatible    Average glucose (Last 7 Days): 184 mg/dL Average glucose (Last 7 Days): 181  Average glucose by time of day:     12 am - 6 am: 148 (improved)      6 am - 12 pm: 146 (improved)     12 pm - 6 pm: 206      6 pm - 12 am: 235 (improved)  Hypo/Hyperglycemia: ?  Symptoms of hypoglycemia since last visit:? yes; Reports no instances of overnight readings 60s-70s in  the past month.  If yes, it was treated by:  glucose tablets (1-2 if 80) . 3-4 tablets if a true low <70.  Symptoms of hyperglycemia since last visit:? no - none  Reported Diet:  Has stopped eating fast food. Working on bringing food with her if she knows she will be out. Breakfast (8:30-9 am): Biscuit, bacon Lunch (4 pm): Largest meal of the day. Subway sandwich, ham sandwich. Dinner (7 pm): Eats what is prepared for her. Sometimes boiled meat, vegetable.  Spaghetti (1-2 times per month), Only veggies she likes cabbage/green beans Beverages: Avoids sugary drinks  Exercise: None. Reports it is difficult to more (uses walker/cane). Sometime walks around [place].  DM Prevention:  Statin: Taking; moderate intensity.?  History of chronic kidney disease? yes History of albuminuria? yes, last UACR on 12/28/21 = 4.0 mg/g ACE/ARB - Taking lisinopril 20 mg daily; Urine MA/CR Ratio - normal.  Last eye exam: 02/15/22; No retinopathy present Last foot exam: 08/14/2021 Tobacco Use: Never smoker Immunizations:? Flu: Up to date (Last: 12/17/2022); Pneumococcal: PCV13 (02/2010, 06/2013) PPSV23 (02/2018; received after age 31); Shingrix: No Record - DUE; Covid (07/07/19, DUE)  Cardiovascular Risk Reduction History of clinical ASCVD? no PREVENT Score: 10-year risk of CVD = 31.1%; 10-year risk of ASCVD = 15.5% History of heart failure? no History of hyperlipidemia? yes Current BMI: 34.7 kg/m2 (Ht 62 in, Wt 86.5 kg) Taking statin? yes; moderate intensity (rosuvastatin 5 mg) Taking aspirin? not indicated; Not taking   Taking SGLT-2i? yes Taking GLP- 1 RA? no   Reported HTN Regimen: Lisinopril 20 mg daily Metoprolol tartrate 25 mg twice daily (PAF) Amlodipine 10 mg daily  Patient denies hypotensive s/sx. No dizziness, lightheadedness.  Patient denies hypertensive symptoms. No headache, chest pain, shortness of breath, visual changes.      _______________________________________________  Objective    Review of Systems:? Limited in the setting of virtual visit  Physical Examination:  Vitals:  Wt Readings from Last 3 Encounters:  03/27/23 190 lb 12.8 oz (86.5 kg)  12/17/22 189 lb 3.2 oz (85.8 kg)  08/14/22 191 lb (86.6 kg)   BP Readings from Last 3 Encounters:  03/27/23 116/70  12/17/22 132/72  09/12/22 (!) 154/64   Pulse Readings from Last 3 Encounters:  03/27/23 68  12/17/22 (!) 57  09/12/22 65    Labs:?  Lab Results  Component Value  Date   HGBA1C 8.8 (H) 03/25/2023   HGBA1C 8.4 (H) 01/07/2023   HGBA1C 8.5 (H) 12/12/2022   GLUCOSE 109 (H) 03/25/2023   MICRALBCREAT 4.0 12/28/2021   MICRALBCREAT 7.2 11/07/2020   MICRALBCREAT 14.7 07/27/2019   CREATININE 1.13 03/25/2023   CREATININE 1.16 01/07/2023   CREATININE 1.24 (H) 12/12/2022   GFR 46.60 (L) 03/25/2023   GFR 45.23 (L) 01/07/2023   GFR 41.77 (L) 12/12/2022    Lab Results  Component Value Date   CHOL 130 03/25/2023   LDLCALC 50 03/25/2023   LDLCALC 14 12/12/2022   LDLCALC 34 07/29/2022   LDLDIRECT 74.4 02/21/2012   HDL 60.80 03/25/2023   TRIG 95.0 03/25/2023   TRIG 199.0 (H) 12/12/2022   TRIG 105.0 07/29/2022   ALT 13 03/25/2023   ALT 21 12/12/2022   AST 15 03/25/2023   AST 17 12/12/2022      Chemistry      Component Value Date/Time   NA 142 03/25/2023 0819   K 4.5 03/25/2023 0819   CL 105 03/25/2023 0819   CO2 28 03/25/2023 0819   BUN 30 (H) 03/25/2023 9147  CREATININE 1.13 03/25/2023 0819   CREATININE 1.03 (H) 05/29/2022 1000      Component Value Date/Time   CALCIUM 9.2 03/25/2023 0819   ALKPHOS 84 03/25/2023 0819   AST 15 03/25/2023 0819   ALT 13 03/25/2023 0819   BILITOT 0.7 03/25/2023 0819     The 10-year ASCVD risk score (Arnett DK, et al., 2019) is: 41.8%  Assessment and Plan:   1. Diabetes, type 2: uncontrolled per last A1c of 8.8% (03/25/23), increased from previous 8.4% as expected with patient reported ~50% adherence to insulin injections. Prior to this, achieved good glycemic control on current insulin dose. Previously established goal of <8% without hypoglycemia. Reports improved adherence as evidenced by improvement in avg sugars from last visit with no lows. Knots in sq abd tissue which may impair efficient insulin absorption given x15 yr of insulin injections. We discussed that injects should be at least 2 inches from belly button. She has been under the impression that injections have to be exactly 2 inches and has been  injection in the same spot for many years.   Current Regimen: metformin 1000 mg BID, [name] 25 mg/d, Basaglar 20 units/d, [name] 8-14 units/meal CGM data Since last Visit: Avg 201?184; 12am-6 am: 185.?148; 6am-12pm: 181?146; 12pm-6pm: 189?206; 6 pm - 12 am: 246?235 Diet: Has cut out fried fast foods and sugary drinks.  Exercise: Limited due to mobility (cane/walker). Discussed goals: Walking through large stores with cart, chair exercises, resistance band moves.   HCM: Overdue: UACR   Continue medication, no changes today. For PPBG consistently >180 mg/dL 2hPP B/L, ??[name] 1-2 units prior to that meal.  PA Submitted for [facility] 3 Reader (though suspect only covered q21yr) Pt also to look for Libre/Dexcom app on Smartphone to assess compatibility Advised patient to call endocrinology if she has not heard from them by end of the month.  Reviewed s/sx/tx hypoglycemia Future Consideration: GLP1-RA: Avoid given previous intolerance Metformin:  Continue to closely monitor renal function. If eGFR falls below 45 consistently, will need to dose-reduce to 500 mg BID   SU: Defer given risk of hypoglycemia on insulin. Could consider morning dose before 1st meal.  TZD: Avoiding due to possible weight gain/increase in fracture risk. DM >10 yrs.     2. HTN/AFib: controlled based on last clinic visit 116/70 mHg. Goal <130/80 mmHg. Denies lightheadedness, dizziness, SOB, CP, vision changes. Denies any flutters in the past month. HR at home have remained mostly normal. Lowest, occasionally 50s without symptoms.  Current Regimen: Lisinopril 20 mg/d, metoprolol tartrate BID, amlodipine 10 mg/d Continue medications without changes.    3. ASCVD (primary prevention): LDL at goal on last lipid panel with LDL 50 mg/dL, TG 95 mh/dL (16/10/96). LDL goal <70 mg/dL (primary prevention, diabetes).  Key risk factors include: diabetes, hypertension (well-controlled), hyperlipidemia (well-controlled), BMI >30 kg/m2, and  sedentary lifestyle PREVENT Score: 10-year risk of CVD = 31.1%; 10-year risk of ASCVD = 15.5% Current Regimen: rosuvastatin 5 mg daily Continue medications today without changes.    4. Healthcare Maintenance:  Pneumococcal - Current status: Up to date  Shingles - Current status: No record - DUE Influenza - Current status: Up to date for 2024  Due to receive the following vaccines: Shingrix and Covid Booster   Follow Up Follow up with clinical pharmacist via phone in ~1 month Patient given direct line for questions regarding medication therapy  Future Appointments  Date Time Provider Department Center  05/27/2023  9:00 AM [facility] PHARMACIST [facility]  06/26/2023  9:15 AM [facility]  06/30/2023  9:30 AM Dale [place], MD [facility]  09/17/2023  8:15 AM Deirdre Evener, MD [facility] None   Loree Fee, PharmD Clinical Pharmacist [facility] [phone]

## 2023-04-29 NOTE — Patient Instructions (Signed)
[name],   It was a pleasure to speak with you today! As we discussed:?   While your sugars remain above our goal, they have improved since the last time we spoke on the phone.  Continue your Basaglar insulin as you have been taking it, 20 units once daily Continue [name] before every meal.  If your sugars are staying above 180 mg/dL 2 hours after you are finished with your meal, then you likely needed more insulin. The next day, try increasing your mealtime insulin by 1-2 units at a time before that meal. Continue [name] and metformin as you have been.  I will submit a request to your insurance to see if they will cover the newer Libre 3 reader device.  You can check you app store on your phone to see if any of the apps are compatible with your type of phone Look for any of the following apps:  Freestyle Libre 2 Freestyle [place] 3 Dexcom G7 (This is the other brand of glucose reader)  Please reach out prior to your next scheduled appointment should you have any questions or concerns.  Thank you!   Future Appointments  Date Time Provider Department Center  05/27/2023  9:00 AM [facility] PHARMACIST [facility]  06/26/2023  9:15 AM [facility]  06/30/2023  9:30 AM Dale [place], MD [facility]  09/17/2023  8:15 AM Deirdre Evener, MD [facility] None    Loree Fee, PharmD Clinical Pharmacist [facility] [phone]

## 2023-05-03 ENCOUNTER — Other Ambulatory Visit: Payer: Self-pay | Admitting: Internal Medicine

## 2023-05-09 NOTE — Telephone Encounter (Signed)
PAP: Patient assistance application for [place] has been approved by PAP Companies: [employer] from 04/09/2023 to 04/07/2024. Medication should be delivered to PAP Delivery: Home. For further shipping updates, please contact [employer] (BI Cares) at [phone]. Patient ID is: NO ID   PLEASE BE ADVISED

## 2023-05-12 ENCOUNTER — Telehealth: Payer: Self-pay

## 2023-05-13 DIAGNOSIS — R0602 Shortness of breath: Secondary | ICD-10-CM | POA: Diagnosis not present

## 2023-05-13 DIAGNOSIS — I10 Essential (primary) hypertension: Secondary | ICD-10-CM | POA: Diagnosis not present

## 2023-05-13 DIAGNOSIS — E7800 Pure hypercholesterolemia, unspecified: Secondary | ICD-10-CM | POA: Diagnosis not present

## 2023-05-13 DIAGNOSIS — I480 Paroxysmal atrial fibrillation: Secondary | ICD-10-CM | POA: Diagnosis not present

## 2023-05-13 NOTE — Telephone Encounter (Signed)
Pt called to follow up on pap application on basaglar and humalog from lilly cares

## 2023-05-21 ENCOUNTER — Other Ambulatory Visit: Payer: Self-pay | Admitting: Internal Medicine

## 2023-05-27 ENCOUNTER — Other Ambulatory Visit: Payer: Medicare HMO

## 2023-06-03 ENCOUNTER — Other Ambulatory Visit: Payer: Medicare HMO | Admitting: Pharmacist

## 2023-06-03 DIAGNOSIS — N183 Chronic kidney disease, stage 3 unspecified: Secondary | ICD-10-CM

## 2023-06-03 NOTE — Progress Notes (Signed)
 06/03/2023 Name: Beth Tyler MRN: [id number] DOB: March 25, 1946  Subjective  Chief Complaint  Patient presents with   Diabetes   Reason for visit: ?  Beth Tyler is a 78 y.o. female with a history of diabetes (type 2), who presents today for a follow up diabetes pharmacotherapy visit.? Pertinent PMH also includes AF, HTN, HLD.  They were referred to the pharmacist by their PCP for assistance in managing diabetes.   Care Team: Primary Care Provider: Dale [place], MD  Known DM Complications: nephropathy    Date of Last Diabetes Related Visit: with PCP on 03/27/23 and with Pharmacist on 04/28/22   Recent Summary of Changes: ? 1/21: Continue [name] before every meal. Inc [name] 1-2 units if 2hPP consistently>180.  12/19: CGM report for 03/14/23 - 03/27/23 - 74% active. 65% target range, 24% high and 11% very high. It appears her highest readings are occurring around 5-6pm. 11/19: Reports missing 50% of insulin injections. Avg BG increased. Purchased notebook for tracking doses to help her w compliance.  11/5: Improvement in CGM on CGM 7.9%. lunch [name] ??2 units 9/10: Worsening of diet. Patient plans to work on diet/exercise. No med changes.  8/20: Pt d/c Rybelsus due to severe abdominal pain. PPBG 220-240 since stopping Ryblesus. Basaglar ??22u 7/19: Basaglar ??18u 2/2 overnight hypoglycemia  Medication Access/Adherence: Prescription drug coverage: Payor: AETNA MEDICARE / Plan: AETNA MEDICARE HMO/PPO / Product Type: *No Product type* / .  Reports that all medications are affordable.  Current Patient Assistance:  BMS (Eliquis) - Filled for $150 (no deductible, pays 25% of medication cost. Feels this is feasible) Pt repots 3% of income ~$900 BI Cares ([name]) - Approved through 2025 LillyCares (Basaglar, [name]) - Approved through 2025 [name] ([name]) - Approved through 2025  (Rybelsus, no longer taking)  Since Last visit / History of Present Illness: ?  Patient  reports implementing plan from last visit. Feels that her prandial insulin adherence has improved significantly, as has her metformin adherence. With this, notes she has not had a low sugar in several months.   Also confirms she has stopped injecting insulin in sites of lipoatrophy and has migrated to softer skin on the abdomen.   Reported DM Regimen: ?  Metformin XR 1000 mg twice daily - Improved adherence [name] 25 mg daily Basaglar 20 units daily [name] 8-14 units TIDAC - Improved adherence - reports consistent use B: Reports using 15 units L: Reports using 15 units D: Reports using 8 units  DM medications tried in the past:?  Rybelsus (severe abdominal upset/pain)  SMBG  Libre 2 :  Not linked via LibreView - Has had current reader for several years. Last prescribed 09/21/2018  Medicare will pay for new reader every ~5 years.  Reports she has a smart phone (Android) but has been told in the past that the app is not compatible  Average glucose (Last 7 Days): 160 mg/dL Average glucose (Last 14 Days): 157 mg/dL Average glucose (Last 30 Days): 145 mg/dL Average glucose (Last 90 Days): 166 mg/dL  Average glucose by time of day (14 days):     12 am - 6 am:  140 (improved)      6 am - 12 pm: 138 (improved)     12 pm - 6 pm:  169      6 pm - 12 am: 183 (improved) 160 on 30 day avg - reports dietary changes based on her errands (does not eat if she has places to go due to  her diarrhea - then will eat a lot more than usual during evening meal). Not clear if [name] dose to fat intake issue or unrelated.   Hypo/Hyperglycemia: ?  Symptoms of hypoglycemia since last visit:? yes; Reports no instances of overnight readings 60s-70s in the past several months.  If yes, it was treated by: Usual treatment =  glucose tablets (1-2 if 80) . 3-4 tablets if a true low <70.  Symptoms of hyperglycemia since last visit:? no - none  Reported Diet:  Has stopped eating fast food. Has been very  diligent Breakfast (8:30-9 am): Biscuit, bacon Lunch (4 pm): Largest meal of the day. Subway sandwich, ham sandwich. Dinner (7 pm): Eats what is prepared for her. Sometimes boiled meat, vegetable. Spaghetti (1-2 times per month), Only veggies she likes cabbage/green beans Beverages: Avoids sugary drinks  Exercise: None. Reports it is difficult to move (uses walker/cane). Sometime walks around [place].  DM Prevention:  Statin: Taking; moderate intensity.?  History of chronic kidney disease? yes History of albuminuria? yes, last UACR on 12/28/21 = 40.0 mg/g ACE/ARB - Taking lisinopril 20 mg daily; Urine MA/CR Ratio - elevated urinary albumin excretion. DUE Last eye exam: 02/15/22; No retinopathy present Last foot exam: 08/14/2021 Tobacco Use: Never smoker Immunizations:? Flu: Up to date (Last: 12/17/2022); Pneumococcal: PCV13 (02/2010, 06/2013) PPSV23 (02/2018; received after age 56); Shingrix: No Record - DUE; Covid (07/07/19, DUE)  Cardiovascular Risk Reduction History of clinical ASCVD? no PREVENT Score: 10-year risk of CVD = 31.1%; 10-year risk of ASCVD = 15.5% History of heart failure? no History of hyperlipidemia? yes Current BMI: 34.7 kg/m2 (Ht 62 in, Wt 86.5 kg) Taking statin? yes; moderate intensity (rosuvastatin 5 mg) Taking aspirin? not indicated; Not taking   Taking SGLT-2i? yes Taking GLP- 1 RA? no   Reported HTN Regimen: Lisinopril 20 mg daily Metoprolol tartrate 25 mg twice daily (PAF) Amlodipine 10 mg daily  Checks BP at home intermittently.  Reports home readings are consistently 126-135/75 mmHg.  Patient denies hypotensive s/sx. No dizziness, lightheadedness.  Patient denies hypertensive symptoms. No headache, chest pain, shortness of breath, visual changes.      _______________________________________________  Objective    Review of Systems:? Limited in the setting of virtual visit  Physical Examination:  Vitals:  Wt Readings from Last 3 Encounters:   03/27/23 190 lb 12.8 oz (86.5 kg)  12/17/22 189 lb 3.2 oz (85.8 kg)  08/14/22 191 lb (86.6 kg)   BP Readings from Last 3 Encounters:  03/27/23 116/70  12/17/22 132/72  09/12/22 (!) 154/64   Pulse Readings from Last 3 Encounters:  03/27/23 68  12/17/22 (!) 57  09/12/22 65    Labs:?  Lab Results  Component Value Date   HGBA1C 8.8 (H) 03/25/2023   HGBA1C 8.4 (H) 01/07/2023   HGBA1C 8.5 (H) 12/12/2022   GLUCOSE 109 (H) 03/25/2023   MICRALBCREAT 4.0 12/28/2021   MICRALBCREAT 7.2 11/07/2020   MICRALBCREAT 14.7 07/27/2019   CREATININE 1.13 03/25/2023   CREATININE 1.16 01/07/2023   CREATININE 1.24 (H) 12/12/2022   GFR 46.60 (L) 03/25/2023   GFR 45.23 (L) 01/07/2023   GFR 41.77 (L) 12/12/2022    Lab Results  Component Value Date   CHOL 130 03/25/2023   LDLCALC 50 03/25/2023   LDLCALC 14 12/12/2022   LDLCALC 34 07/29/2022   LDLDIRECT 74.4 02/21/2012   HDL 60.80 03/25/2023   TRIG 95.0 03/25/2023   TRIG 199.0 (H) 12/12/2022   TRIG 105.0 07/29/2022   ALT 13 03/25/2023   ALT  21 12/12/2022   AST 15 03/25/2023   AST 17 12/12/2022      Chemistry      Component Value Date/Time   NA 142 03/25/2023 0819   K 4.5 03/25/2023 0819   CL 105 03/25/2023 0819   CO2 28 03/25/2023 0819   BUN 30 (H) 03/25/2023 0819   CREATININE 1.13 03/25/2023 0819   CREATININE 1.03 (H) 05/29/2022 1000      Component Value Date/Time   CALCIUM 9.2 03/25/2023 0819   ALKPHOS 84 03/25/2023 0819   AST 15 03/25/2023 0819   ALT 13 03/25/2023 0819   BILITOT 0.7 03/25/2023 0819     The 10-year ASCVD risk score (Arnett DK, et al., 2019) is: 64.5%  Assessment and Plan:   1. Diabetes, type 2: uncontrolled per last A1c of 8.8% (03/25/23), increased from previous 8.4% as expected with patient reported ~50% adherence to insulin injections. Prior to this, achieved good glycemic control on current insulin dose. Previously established goal of <8% without hypoglycemia.  Today, reports continued improved  adherence with prandial injections as well as metformin doses. No longer injecting in sites of lipoatrophy. No lows with adhering to correct medication administration. Average BG well controlled at 145-160 mg/dL over the last month which correlated to an estimated A1c ~6.6%-7.2%.  Current Regimen: metformin 1000 mg BID, [name] 25 mg/d, Basaglar 20 units/d, [name] 8-14 units/meal Diet: Has cut out fried fast foods and sugary drinks. Since last visit, cut out rice. Has potatoes regularly, though more mindful of portions most days.  Exercise: Limited due to mobility (cane/walker). Discussed goals: Walking through large stores with cart, chair exercises, resistance band moves.     Continue medication, no changes today. For PPBG consistently >180 mg/dL 2hPP B/L, ??[name] 1-2 units prior to that meal.  Test claim requested for [facility] 3 Reader (covered every 5 years, last filled Libre 2 reader 2020) Reviewed s/sx/tx hypoglycemia HCM: Overdue: UACR - Elevated >30 mg/g 2023 Future Consideration: GLP1-RA: Avoid given previous intolerance Metformin:  Continue to closely monitor renal function. If eGFR falls below 45 consistently, will need to dose-reduce to 500 mg BID   SU: Defer given risk of hypoglycemia on insulin. Could consider morning dose before 1st meal.  TZD: Avoiding due to possible weight gain/increase in fracture risk. DM >10 yrs.     2. HTN/AFib: controlled based on last clinic visit 116/70 mHg. Goal <130/80 mmHg. Denies lightheadedness, dizziness, SOB, CP, vision changes. Denies any flutters in the past month. HR at home have remained mostly normal. Lowest, occasionally 50s without symptoms. BP at home consistently 120-130/mid 70s mmHg.  Current Regimen: Lisinopril 20 mg/d, metoprolol tartrate BID, amlodipine 10 mg/d Continue medications without changes.    3. ASCVD (primary prevention): LDL at goal on last lipid panel with LDL 50 mg/dL, TG 95 mh/dL (09/81/19). LDL goal <70 mg/dL  (primary prevention, diabetes).  Key risk factors include: diabetes, hypertension (well-controlled), hyperlipidemia (well-controlled), BMI >30 kg/m2, and sedentary lifestyle PREVENT Score: 10-year risk of CVD = 31.1%; 10-year risk of ASCVD = 15.5% Current Regimen: rosuvastatin 5 mg daily Continue medications today without changes    4. Healthcare Maintenance:  Pneumococcal - Current status: Up to date  Shingles - Current status: No record - DUE Influenza - Current status: Up to date for 2024  Tetanus - DUE Due to receive the following vaccines: Shingrix, Covid Booster, and TDap   Follow Up PCP scheduled 1 month  Follow up with clinical pharmacist via phone ~1 month after next PCP  visit  Patient given direct line for questions regarding medication therapy  Future Appointments  Date Time Provider Department Center  06/26/2023  9:15 AM [facility]  06/30/2023  9:30 AM Dale [place], MD [facility]  07/29/2023  9:00 AM [facility] PHARMACIST [facility]  09/17/2023  8:15 AM Deirdre Evener, MD [facility] None   Loree Fee, PharmD Clinical Pharmacist [facility] [phone]

## 2023-06-03 NOTE — Patient Instructions (Addendum)
 [name],   It was a pleasure to speak with you today! As we discussed:?   While your sugars remain above our goal, they have improved since the last time we spoke on the phone.  Continue your Basaglar insulin as you have been taking it, 20 units once daily Continue [name] before every meal.  If your sugars are staying above 180 mg/dL 2 hours after you are finished with your meal, then you likely needed more insulin. The next day, try increasing your mealtime insulin by 1-2 units at a time before that meal. Continue [name] and metformin as you have been.  I will submit a test claim to your insurance to see if they will cover the newer Libre 3 reader device. If they do not, one should be covered after 5 years (we initially prescribed your reader June 2020 so we should be close to that time)  You can check the app store on your phone to see if any of the apps are compatible with your type of phone Look for any of the following apps within the app store on your phone:   You can find the App Store on your phone by clicking the app store button - This should look like one of these if you have an iPhone, Android or Google phone:     2. Type in "[place]" in the [employer] search bar and see if the app comes up. The app will look like one of the images below:  Libre 2 Libre 3 Dexcom G7        Please reach out prior to your next scheduled appointment should you have any questions or concerns.  Thank you!   Future Appointments  Date Time Provider Department Center  06/26/2023  9:15 AM [facility]  06/30/2023  9:30 AM Dale Webster, MD [facility]  07/29/2023  9:00 AM [facility] PHARMACIST [facility]  09/17/2023  8:15 AM Deirdre Evener, MD [facility] None    Loree Fee, PharmD Clinical Pharmacist [facility] [phone]

## 2023-06-09 ENCOUNTER — Other Ambulatory Visit: Payer: Self-pay | Admitting: Internal Medicine

## 2023-06-20 ENCOUNTER — Telehealth: Payer: Self-pay | Admitting: Internal Medicine

## 2023-06-20 DIAGNOSIS — E7800 Pure hypercholesterolemia, unspecified: Secondary | ICD-10-CM

## 2023-06-20 DIAGNOSIS — I10 Essential (primary) hypertension: Secondary | ICD-10-CM

## 2023-06-20 DIAGNOSIS — E1122 Type 2 diabetes mellitus with diabetic chronic kidney disease: Secondary | ICD-10-CM

## 2023-06-20 NOTE — Telephone Encounter (Signed)
 Patient need lab orders.

## 2023-06-21 NOTE — Addendum Note (Signed)
 Addended by: Charm Barges on: 06/21/2023 10:43 AM   Modules accepted: Orders

## 2023-06-21 NOTE — Telephone Encounter (Signed)
Orders placed for f/u labs.  

## 2023-06-26 ENCOUNTER — Other Ambulatory Visit (INDEPENDENT_AMBULATORY_CARE_PROVIDER_SITE_OTHER): Payer: Medicare HMO

## 2023-06-26 DIAGNOSIS — E1122 Type 2 diabetes mellitus with diabetic chronic kidney disease: Secondary | ICD-10-CM

## 2023-06-26 DIAGNOSIS — I10 Essential (primary) hypertension: Secondary | ICD-10-CM

## 2023-06-26 DIAGNOSIS — N183 Chronic kidney disease, stage 3 unspecified: Secondary | ICD-10-CM | POA: Diagnosis not present

## 2023-06-26 DIAGNOSIS — E7800 Pure hypercholesterolemia, unspecified: Secondary | ICD-10-CM | POA: Diagnosis not present

## 2023-06-26 DIAGNOSIS — Z794 Long term (current) use of insulin: Secondary | ICD-10-CM | POA: Diagnosis not present

## 2023-06-26 LAB — BASIC METABOLIC PANEL
BUN: 35 mg/dL — ABNORMAL HIGH (ref 6–23)
CO2: 27 meq/L (ref 19–32)
Calcium: 9.8 mg/dL (ref 8.4–10.5)
Chloride: 105 meq/L (ref 96–112)
Creatinine, Ser: 1.16 mg/dL (ref 0.40–1.20)
GFR: 45.08 mL/min — ABNORMAL LOW (ref >60.00)
Glucose, Bld: 157 mg/dL — ABNORMAL HIGH (ref 70–99)
Potassium: 4.7 meq/L (ref 3.5–5.1)
Sodium: 140 meq/L (ref 135–145)

## 2023-06-26 LAB — HEPATIC FUNCTION PANEL
ALT: 18 U/L (ref 0–35)
AST: 18 U/L (ref 0–37)
Albumin: 4.7 g/dL (ref 3.5–5.2)
Alkaline Phosphatase: 94 U/L (ref 39–117)
Bilirubin, Direct: 0.1 mg/dL (ref 0.0–0.3)
Total Bilirubin: 0.4 mg/dL (ref 0.2–1.2)
Total Protein: 7.6 g/dL (ref 6.0–8.3)

## 2023-06-26 LAB — CBC WITH DIFFERENTIAL/PLATELET
Basophils Absolute: 0.1 10*3/uL (ref 0.0–0.1)
Basophils Relative: 1.1 % (ref 0.0–3.0)
Eosinophils Absolute: 0.4 10*3/uL (ref 0.0–0.7)
Eosinophils Relative: 5.7 % — ABNORMAL HIGH (ref 0.0–5.0)
HCT: 41.1 % (ref 36.0–46.0)
Hemoglobin: 13.3 g/dL (ref 12.0–15.0)
Lymphocytes Relative: 30.9 % (ref 12.0–46.0)
Lymphs Abs: 2.4 10*3/uL (ref 0.7–4.0)
MCHC: 32.4 g/dL (ref 30.0–36.0)
MCV: 91.6 fl (ref 78.0–100.0)
Monocytes Absolute: 0.8 10*3/uL (ref 0.1–1.0)
Monocytes Relative: 10.1 % (ref 3.0–12.0)
Neutro Abs: 4 10*3/uL (ref 1.4–7.7)
Neutrophils Relative %: 52.2 % (ref 43.0–77.0)
Platelets: 221 10*3/uL (ref 150.0–400.0)
RBC: 4.48 Mil/uL (ref 3.87–5.11)
RDW: 14 % (ref 11.5–15.5)
WBC: 7.7 10*3/uL (ref 4.0–10.5)

## 2023-06-26 LAB — LIPID PANEL
Cholesterol: 124 mg/dL (ref 0–200)
HDL: 56.4 mg/dL (ref >39.00)
LDL Cholesterol: 49 mg/dL (ref 0–99)
NonHDL: 67.92
Total CHOL/HDL Ratio: 2
Triglycerides: 97 mg/dL (ref 0.0–149.0)
VLDL: 19.4 mg/dL (ref 0.0–40.0)

## 2023-06-26 LAB — HEMOGLOBIN A1C: Hgb A1c MFr Bld: 7.8 % — ABNORMAL HIGH (ref 4.6–6.5)

## 2023-06-26 LAB — TSH: TSH: 1.27 u[IU]/mL (ref 0.35–5.50)

## 2023-06-30 ENCOUNTER — Encounter: Payer: Self-pay | Admitting: Internal Medicine

## 2023-06-30 ENCOUNTER — Ambulatory Visit (INDEPENDENT_AMBULATORY_CARE_PROVIDER_SITE_OTHER): Payer: Medicare HMO | Admitting: Internal Medicine

## 2023-06-30 VITALS — BP 130/70 | HR 71 | Temp 98.2°F | Resp 16 | Ht 62.0 in | Wt 190.0 lb

## 2023-06-30 DIAGNOSIS — D6859 Other primary thrombophilia: Secondary | ICD-10-CM

## 2023-06-30 DIAGNOSIS — N183 Chronic kidney disease, stage 3 unspecified: Secondary | ICD-10-CM

## 2023-06-30 DIAGNOSIS — E1122 Type 2 diabetes mellitus with diabetic chronic kidney disease: Secondary | ICD-10-CM

## 2023-06-30 DIAGNOSIS — M79676 Pain in unspecified toe(s): Secondary | ICD-10-CM | POA: Diagnosis not present

## 2023-06-30 DIAGNOSIS — E7800 Pure hypercholesterolemia, unspecified: Secondary | ICD-10-CM | POA: Diagnosis not present

## 2023-06-30 DIAGNOSIS — Z794 Long term (current) use of insulin: Secondary | ICD-10-CM | POA: Diagnosis not present

## 2023-06-30 DIAGNOSIS — I480 Paroxysmal atrial fibrillation: Secondary | ICD-10-CM

## 2023-06-30 DIAGNOSIS — I10 Essential (primary) hypertension: Secondary | ICD-10-CM | POA: Diagnosis not present

## 2023-06-30 DIAGNOSIS — I700 Atherosclerosis of aorta: Secondary | ICD-10-CM

## 2023-06-30 DIAGNOSIS — E1140 Type 2 diabetes mellitus with diabetic neuropathy, unspecified: Secondary | ICD-10-CM

## 2023-06-30 MED ORDER — FREESTYLE LIBRE 2 SENSOR MISC: 5 refills | Status: DC

## 2023-06-30 MED ORDER — METOPROLOL TARTRATE 25 MG PO TABS: 25.0000 mg | ORAL_TABLET | Freq: Two times a day (BID) | ORAL | 3 refills | Status: AC

## 2023-06-30 MED ORDER — AMLODIPINE BESYLATE 10 MG PO TABS: 10.0000 mg | ORAL_TABLET | Freq: Every day | ORAL | 3 refills | Status: AC

## 2023-06-30 MED ORDER — LISINOPRIL 20 MG PO TABS: 20.0000 mg | ORAL_TABLET | Freq: Every day | ORAL | 3 refills | Status: AC

## 2023-06-30 MED ORDER — PANTOPRAZOLE SODIUM 40 MG PO TBEC: 40.0000 mg | DELAYED_RELEASE_TABLET | Freq: Two times a day (BID) | ORAL | 3 refills | Status: AC

## 2023-06-30 MED ORDER — APIXABAN 5 MG PO TABS: 5.0000 mg | ORAL_TABLET | Freq: Two times a day (BID) | ORAL | 3 refills | Status: AC

## 2023-06-30 MED ORDER — METFORMIN HCL ER 500 MG PO TB24: 1000.0000 mg | ORAL_TABLET | Freq: Two times a day (BID) | ORAL | 3 refills | Status: AC

## 2023-06-30 MED ORDER — MAGNESIUM OXIDE -MG SUPPLEMENT 400 (240 MG) MG PO TABS: 1.0000 | ORAL_TABLET | Freq: Every day | ORAL | 3 refills | Status: AC

## 2023-06-30 NOTE — Progress Notes (Unsigned)
 Subjective:    Patient ID: Beth Tyler, female    DOB: 02/08/1946, 78 y.o.   MRN: [id number]  Patient here for  Chief Complaint  Patient presents with   Medical Management of Chronic Issues    HPI Here for a scheduled follow up - follow up regarding diabetes, hypercholesterolemia and hypertension. On jardiance, basaglar, humalog, metformin for her diabetes. Had GI issues with 14mg  dose of rybelsus. Off rybelsus. Reviewed CGM report for  - active.  target range, high and very high. Has been working with pharmacy to help regulate her sugars. Was previously referred to endocrinology. Had f/u with cardiology 05/13/23 - continue eliquis, simvastatin. Stable.    Past Medical History:  Diagnosis Date   Anemia    Arthritis    B12 deficiency    Carpal tunnel syndrome, bilateral    COVID-19 03/18/2019   Diabetes mellitus (HCC) 02/17/2012   Diabetes mellitus, type II (HCC)    Dyspnea    Gastritis    GERD (gastroesophageal reflux disease)    Hypercholesterolemia    Hypertension    IBS (irritable bowel syndrome)    Neuropathy    legs   Pernicious anemia    Vertigo    no episodes fo 9 or 10 yrs   Past Surgical History:  Procedure Laterality Date   ANTERIOR VITRECTOMY Right 12/01/2017   Procedure: ANTERIOR VITRECTOMY;  Surgeon: Nevada Crane, MD;  Location: [facility];  Service: Ophthalmology;  Laterality: Right;   BREAST BIOPSY Right 1980   neg   BREAST EXCISIONAL BIOPSY Right 1980   CATARACT EXTRACTION W/PHACO Left 10/13/2017   Procedure: CATARACT EXTRACTION PHACO AND INTRAOCULAR LENS PLACEMENT (IOC)  LEFT  DIABETES;  Surgeon: Nevada Crane, MD;  Location: [facility];  Service: Ophthalmology;  Laterality: Left;  Diabetic - insulin and oral meds   CATARACT EXTRACTION W/PHACO Right 12/01/2017   Procedure: CATARACT EXTRACTION PHACO AND INTRAOCULAR LENS PLACEMENT (IOC)  RIGHT DIABETIC;  Surgeon: Nevada Crane, MD;  Location: [facility];   Service: Ophthalmology;  Laterality: Right;  Diabetic - insulin and oral   CHOLECYSTECTOMY  1983   Family History  Problem Relation Age of Onset   Diabetes Father    Kidney disease Father    Heart disease Father    Alcohol abuse Mother    Dementia Mother    Diabetes Mother    Diabetes Other    Hemophilia Brother    Uterine cancer Sister    Diabetes Sister    COPD Sister    Breast cancer Maternal Aunt 44   Breast cancer Daughter 55   Stroke Daughter    Diabetes Son    Social History   Socioeconomic History   Marital status: Widowed    Spouse name: Not on file   Number of children: 4   Years of education: Not on file   Highest education level: Not on file  Occupational History   Not on file  Tobacco Use   Smoking status: Never   Smokeless tobacco: Never  Vaping Use   Vaping status: Never Used  Substance and Sexual Activity   Alcohol use: No    Alcohol/week: 0.0 standard drinks of alcohol   Drug use: No   Sexual activity: Not Currently  Other Topics Concern   Not on file  Social History Narrative   Widowed. Has four children. Manager of an apartment building. No tobacco, alcohol or other drug use   Social Drivers of Health  Financial Resource Strain: Low Risk  (08/14/2022)   Overall Financial Resource Strain (CARDIA)    Difficulty of Paying Living Expenses: Not hard at all  Food Insecurity: No Food Insecurity (08/14/2022)   Hunger Vital Sign    Worried About Running Out of Food in the Last Year: Never true    Ran Out of Food in the Last Year: Never true  Transportation Needs: No Transportation Needs (08/14/2022)   PRAPARE - Administrator, Civil Service (Medical): No    Lack of Transportation (Non-Medical): No  Physical Activity: Insufficiently Active (08/14/2022)   Exercise Vital Sign    Days of Exercise per Week: 7 days    Minutes of Exercise per Session: 20 min  Stress: No Stress Concern Present (08/14/2022)   [employer] of Occupational Health  - Occupational Stress Questionnaire    Feeling of Stress : Not at all  Social Connections: Socially Integrated (08/14/2022)   Social Connection and Isolation Panel [NHANES]    Frequency of Communication with Friends and Family: More than three times a week    Frequency of Social Gatherings with Friends and Family: More than three times a week    Attends Religious Services: More than 4 times per year    Active Member of [employer] or Organizations: Yes    Attends Engineer, structural: More than 4 times per year    Marital Status: Married     Review of Systems     Objective:     BP 130/70   Pulse 71   Temp 98.2 F (36.8 C)   Resp 16   Ht 5\' 2"  (1.575 m)   Wt 190 lb (86.2 kg)   SpO2 98%   BMI 34.75 kg/m  Wt Readings from Last 3 Encounters:  06/30/23 190 lb (86.2 kg)  03/27/23 190 lb 12.8 oz (86.5 kg)  12/17/22 189 lb 3.2 oz (85.8 kg)    Physical Exam  {Perform Simple Foot Exam  Perform Detailed exam:1} {Insert foot Exam (Optional):30965}   Outpatient Encounter Medications as of 06/30/2023  Medication Sig   acetaminophen (TYLENOL) 500 MG tablet Take 500-1,000 mg by mouth every 6 (six) hours as needed for mild pain or moderate pain.    albuterol (VENTOLIN HFA) 108 (90 Base) MCG/ACT inhaler Inhale 2 puffs into the lungs every 4 (four) hours as needed for wheezing or shortness of breath.   albuterol (VENTOLIN HFA) 108 (90 Base) MCG/ACT inhaler Inhale 1-2 puffs into the lungs every 6 (six) hours as needed for wheezing or shortness of breath.   amLODipine (NORVASC) 10 MG tablet Take 1 tablet (10 mg total) by mouth daily.   apixaban (ELIQUIS) 5 MG TABS tablet Take 1 tablet (5 mg total) by mouth 2 (two) times daily.   BD PEN NEEDLE NANO 2ND GEN 32G X 4 MM MISC USE 1 PEN NEEDLE 4 TIMES DAILY   Continuous Glucose Sensor (FREESTYLE LIBRE 2 SENSOR) MISC USE TO CHECK BLOOD SUGARS AT LEAST 4 TIMES A DAY   empagliflozin ([name]) 25 MG TABS tablet Take 1 tablet (25 mg total) by  mouth daily before breakfast.   ferrous sulfate 325 (65 FE) MG tablet Take 325 mg by mouth daily with breakfast.   fluticasone (CUTIVATE) 0.05 % cream Apply 1 Application topically as needed. Use as directed.   glucose blood (ONETOUCH VERIO) test strip USE AS DIRECTED   Insulin Glargine (BASAGLAR KWIKPEN) 100 UNIT/ML Inject 22 Units into the skin every morning. Patient assistance   insulin  lispro ([name] KWIKPEN) 100 UNIT/ML KwikPen Inject 12-16 Units into the skin 3 (three) times daily. Patient assistance   ipratropium (ATROVENT) 0.06 % nasal spray Place 2 sprays into both nostrils 4 (four) times daily.   lisinopril (ZESTRIL) 20 MG tablet Take 1 tablet by mouth once daily   MAGnesium-Oxide 400 (240 Mg) MG tablet Take 1 tablet (400 mg total) by mouth daily.   metFORMIN (GLUCOPHAGE-XR) 500 MG 24 hr tablet Take 2 tablets (1,000 mg total) by mouth 2 (two) times daily.   metoprolol tartrate (LOPRESSOR) 25 MG tablet Take 1 tablet (25 mg total) by mouth 2 (two) times daily. Take 25 mg by mouth 2 (two) times daily.   Multiple Vitamin (MULTIVITAMIN) tablet Take 1 tablet by mouth daily.   mupirocin ointment (BACTROBAN) 2 % Apply 1 application. topically 2 (two) times daily.   Pancrelipase, Lip-Prot-Amyl, ([name] PO) Take by mouth. Take 36,000-72,000 Units by mouth See admin instructions. Take 3 capsules by mouth three times daily with meals and take 2 capsule by mouth daily with snacks   pantoprazole (PROTONIX) 40 MG tablet TAKE 1 TABLET BY MOUTH TWICE DAILY BEFORE MEAL(S)   rosuvastatin (CRESTOR) 5 MG tablet Take 1 tablet by mouth once daily   triamcinolone cream (KENALOG) 0.1 % APPLY  CREAM EXTERNALLY TWICE DAILY   vitamin B-12 (CYANOCOBALAMIN) 100 MCG tablet Take 100 mcg by mouth daily.   No facility-administered encounter medications on file as of 06/30/2023.     Lab Results  Component Value Date   WBC 7.7 06/26/2023   HGB 13.3 06/26/2023   HCT 41.1 06/26/2023   PLT 221.0 06/26/2023   GLUCOSE  157 (H) 06/26/2023   CHOL 124 06/26/2023   TRIG 97.0 06/26/2023   HDL 56.40 06/26/2023   LDLDIRECT 74.4 02/21/2012   LDLCALC 49 06/26/2023   ALT 18 06/26/2023   AST 18 06/26/2023   NA 140 06/26/2023   K 4.7 06/26/2023   CL 105 06/26/2023   CREATININE 1.16 06/26/2023   BUN 35 (H) 06/26/2023   CO2 27 06/26/2023   TSH 1.27 06/26/2023   INR 1.2 07/04/2021   HGBA1C 7.8 (H) 06/26/2023   MICROALBUR 2.9 (H) 12/28/2021    MM 3D SCREENING MAMMOGRAM BILATERAL BREAST Result Date: 10/25/2022 CLINICAL DATA:  Screening. EXAM: DIGITAL SCREENING BILATERAL MAMMOGRAM WITH TOMOSYNTHESIS AND CAD TECHNIQUE: Bilateral screening digital craniocaudal and mediolateral oblique mammograms were obtained. Bilateral screening digital breast tomosynthesis was performed. The images were evaluated with computer-aided detection. COMPARISON:  Previous exam(s). ACR Breast Density Category b: There are scattered areas of fibroglandular density. FINDINGS: There are no findings suspicious for malignancy. IMPRESSION: No mammographic evidence of malignancy. A result letter of this screening mammogram will be mailed directly to the patient. RECOMMENDATION: Screening mammogram in one year. (Code:SM-B-01Y) BI-RADS CATEGORY  1: Negative. Electronically Signed   By: Amie Portland M.D.   On: 10/25/2022 15:31       Assessment & Plan:  Benign essential hypertension  Hypercholesterolemia  Type 2 diabetes mellitus with stage 3 chronic kidney disease, with long-term current use of insulin, unspecified whether stage 3a or 3b CKD (HCC)  Paroxysmal A-fib (HCC)  Type 2 diabetes mellitus with diabetic neuropathy, with long-term current use of insulin (HCC)     Dale [place], MD

## 2023-06-30 NOTE — Addendum Note (Signed)
 Addended by: Jarvis Morgan D on: 06/30/2023 10:18 AM   Modules accepted: Orders

## 2023-07-04 ENCOUNTER — Telehealth: Payer: Self-pay | Admitting: Internal Medicine

## 2023-07-04 ENCOUNTER — Encounter: Payer: Self-pay | Admitting: Internal Medicine

## 2023-07-04 DIAGNOSIS — M79676 Pain in unspecified toe(s): Secondary | ICD-10-CM | POA: Insufficient documentation

## 2023-07-04 NOTE — Assessment & Plan Note (Signed)
 Is diabetic. Needs toenails trimmed. Some discomfort. Refer to podiatry.

## 2023-07-04 NOTE — Assessment & Plan Note (Signed)
 Continue metoprolol and eliquis.  No changes in medication. Stable.

## 2023-07-04 NOTE — Assessment & Plan Note (Signed)
 Continue crestor.  Low cholesterol diet and exercise.  Follow lipid panel and liver function tests.

## 2023-07-04 NOTE — Assessment & Plan Note (Signed)
 Continue crestor

## 2023-07-04 NOTE — Telephone Encounter (Signed)
 She was previously referred to endocrinology for her diabetes. States has been authorized. She has not heard about scheduling an appt. Can you help with this?  Thanks.

## 2023-07-04 NOTE — Assessment & Plan Note (Signed)
 Discussed diet and exercise. On jardiance, basaglar, humalog, metformin for her diabetes. Had GI issues with 14mg  dose of rybelsus.  10/25/22 visit- basaglar decreased to 18 units due to periodic overnight hypoglycemia. [name] was increased before meals. Per review of 11/26/22 f/u note, she stopped her rybelsus due to severe abdominal pain/upset. Post prandial sugars - 220-240s since stopping rybelsus.  No hypoglycemic signs/symptoms. Basaglar increased back to 22 units.  She was eating more fast food.  Spot checking sugars. Not using CGM currently. Sugars as outlined. She is going to work on diet and exercise. GI issues have been chronic for her. Does not appear to be related to metformin.  She has to adjust her eating, due to her activities for the day - to avoid loose stool/accidents while away from home.  This affects her taking her insulin. Given have tried multiple medications and variations of insulin dosing, discussed possible insulin pump and evaluation by endocrinology.  She is agreeable for referral. Appt has not been scheduled. F/u on scheduling appt with endocrinology.

## 2023-07-04 NOTE — Assessment & Plan Note (Signed)
afib on eliquis.  

## 2023-07-04 NOTE — Assessment & Plan Note (Signed)
 Continue metoprolol and amlodipine. On lisinopril 20mg  q day.  Blood pressure as outlined. Hold on making changes in medication. Follow pressures. Follow metabolic panel.

## 2023-07-23 ENCOUNTER — Ambulatory Visit: Admitting: Podiatry

## 2023-07-23 ENCOUNTER — Encounter: Payer: Self-pay | Admitting: Podiatry

## 2023-07-23 VITALS — Ht 62.0 in | Wt 190.0 lb

## 2023-07-23 DIAGNOSIS — E1122 Type 2 diabetes mellitus with diabetic chronic kidney disease: Secondary | ICD-10-CM | POA: Diagnosis not present

## 2023-07-23 DIAGNOSIS — B351 Tinea unguium: Secondary | ICD-10-CM

## 2023-07-23 DIAGNOSIS — N183 Chronic kidney disease, stage 3 unspecified: Secondary | ICD-10-CM

## 2023-07-23 DIAGNOSIS — Z794 Long term (current) use of insulin: Secondary | ICD-10-CM

## 2023-07-23 DIAGNOSIS — M79675 Pain in left toe(s): Secondary | ICD-10-CM

## 2023-07-23 DIAGNOSIS — M79674 Pain in right toe(s): Secondary | ICD-10-CM | POA: Diagnosis not present

## 2023-07-23 NOTE — Progress Notes (Signed)
  Subjective:  Patient ID: Beth Tyler, female    DOB: 10-08-1945,  MRN: [id number]  Chief Complaint  Patient presents with   Nail Problem    Pt is here for [facility]    78 y.o. female presents with the above complaint. History confirmed with patient. Referred by her PCP for diabetic foot exam she has thickened elongated nails that she is unable to cut as well.  They are causing pain in her shoes.  Objective:  Physical Exam: warm, good capillary refill, no trophic changes or ulcerative lesions, normal DP and PT pulses, and abnormal sensory exam with polyneuropathy. Left Foot: dystrophic yellowed discolored nail plates with subungual debris Right Foot: dystrophic yellowed discolored nail plates with subungual debris   Assessment:   1. Pain due to onychomycosis of toenails of both feet   2. Type 2 diabetes mellitus with stage 3 chronic kidney disease, with long-term current use of insulin, unspecified whether stage 3a or 3b CKD (HCC)      Plan:  Patient was evaluated and treated and all questions answered.   Patient educated on diabetes. Discussed proper diabetic foot care and discussed risks and complications of disease. Educated patient in depth on reasons to return to the office immediately should he/she discover anything concerning or new on the feet. All questions answered. Discussed proper shoes as well.   Discussed the etiology and treatment options for the condition in detail with the patient. Recommended debridement of the nails today. Sharp and mechanical debridement performed of all painful and mycotic nails today. Nails debrided in length and thickness using a nail nipper to level of comfort. Discussed treatment options including appropriate shoe gear. Follow up as needed for painful nails.    Return in about 3 months (around 10/22/2023) for at risk diabetic foot care.

## 2023-07-29 ENCOUNTER — Other Ambulatory Visit: Payer: Medicare HMO

## 2023-07-29 NOTE — Progress Notes (Deleted)
 07/29/2023 Name: Beth Tyler MRN: [id number] DOB: April 27, 1945  Subjective  No chief complaint on file.  Reason for visit: ?  Beth Tyler is a 78 y.o. female with a history of diabetes (type 2), who presents today for a follow up diabetes pharmacotherapy visit.? Pertinent PMH also includes AF, HTN, HLD.  They were referred to the pharmacist by their PCP for assistance in managing diabetes.   Care Team: Primary Care Provider: Dellar Fenton, MD  Known DM Complications: nephropathy    Date of Last Diabetes Related Visit: with PCP on 03/27/23 and with Pharmacist on 06/03/23   Recent Summary of Changes: ? 2/25: *** 1/21: Continue [name]  before every meal. Inc [name]  1-2 units if 2hPP consistently>180.  12/19: CGM report for 03/14/23 - 03/27/23 - 74% active. 65% target range, 24% high and 11% very high. It appears her highest readings are occurring around 5-6pm. 11/19: Reports missing 50% of insulin  injections.  11/5:  ?? lunch [name]  2 units 8/20: Pt d/c Rybelsus  2/2 severe abd pain. PPBG 220-240. ??Basaglar  22u 7/19: ??basaglar  18u 2/2 overnight hypoglycemia  Medication Access/Adherence: Prescription drug coverage: Payor: [employer] MEDICARE / Plan: [employer] MEDICARE HMO/PPO / Product Type: *No Product type* / .  Reports that all medications are affordable.  Current Patient Assistance:  BMS (Eliquis ) - Filled for $150 (no deductible, pays 25% of medication cost. Feels this is feasible) Pt repots 3% of income ~$900 BI Cares ([name] ) - Approved through 2025 LillyCares (Basaglar , [name] ) - Approved through 2025 [name] ([name] ) - Approved through 2025  (Rybelsus , no longer taking)  Since Last visit / History of Present Illness: ?  Patient reports implementing plan from last visit. Feels that her prandial insulin  adherence has improved significantly, as has her metformin  adherence. With this, notes she has not had a low sugar in several months.   Also confirms she has stopped  injecting insulin  in sites of lipoatrophy and has migrated to softer skin on the abdomen.   Reported DM Regimen: ?  Metformin  XR 1000 mg twice daily - Improved adherence [name]  25 mg daily Basaglar  20 units daily [name]  8-14 units TIDAC - Improved adherence - reports consistent use B: Reports using 15 units L: Reports using 15 units D: Reports using 8 units  DM medications tried in the past:?  Rybelsus  (severe abdominal upset/pain)  SMBG  Libre 2 :  Not linked via LibreView - Has had current Tyler for several years. Last prescribed 09/21/2018 - Medicare will pay for new Tyler every ~5 years.  Reports she has a smart phone (Android) but has been told in the past that the app is not compatible  Average glucose (Last 7 Days): 160 mg/dL Average glucose (Last 14 Days): 157 mg/dL Average glucose (Last 30 Days): 145 mg/dL Average glucose (Last 90 Days): 166 mg/dL  Average glucose by time of day (14 days):     12 am - 6 am:  140 (improved)      6 am - 12 pm: 138 (improved)     12 pm - 6 pm:  169      6 pm - 12 am: 183 (improved) 160 on 30 day avg - reports dietary changes based on her errands (does not eat if she has places to go due to her diarrhea - then will eat a lot more than usual during evening meal). Not clear if [name]  dose to fat intake issue or unrelated.   Hypo/Hyperglycemia: ?  Symptoms of hypoglycemia since last visit:? yes; Reports  no instances of overnight readings 60s-70s in the past several months.  If yes, it was treated by: Usual treatment =  glucose tablets (1-2 if 80) . 3-4 tablets if a true low <70.  Symptoms of hyperglycemia since last visit:? no - none  Reported Diet:  Has stopped eating fast food. Has been very diligent Breakfast (8:30-9 am): Biscuit, bacon Lunch (4 pm): Largest meal of the day. Subway sandwich, ham sandwich. Dinner (7 pm): Eats what is prepared for her. Sometimes boiled meat, vegetable. Spaghetti (1-2 times per month), Only veggies she  likes cabbage/green beans Beverages: Avoids sugary drinks  Exercise: None. Reports it is difficult to move (uses walker/cane). Sometime walks around [place].  DM Prevention:  Statin: Taking; moderate intensity.?  History of chronic kidney disease? yes History of albuminuria? yes, last UACR on 12/28/21 = 40.0 mg/g ACE/ARB - Taking lisinopril  20 mg daily; Urine MA/CR Ratio - elevated urinary albumin excretion. DUE Last eye exam: 02/15/22; No retinopathy present Last foot exam: 08/14/2021 Tobacco Use: Never smoker Immunizations:? Flu: Up to date (Last: 12/17/2022); Pneumococcal: PCV13 (02/2010, 06/2013) PPSV23 (02/2018; received after age 50); Shingrix: No Record - DUE; Covid (07/07/19, DUE)  Cardiovascular Risk Reduction History of clinical ASCVD? no PREVENT Score: 10-year risk of CVD = 31.1%; 10-year risk of ASCVD = 15.5% History of heart failure? no History of hyperlipidemia? yes Current BMI: 34.7 kg/m2 (Ht 62 in, Wt 86.2 kg) Taking statin? yes; moderate intensity (rosuvastatin  5 mg) Taking aspirin? not indicated; Not taking   Taking SGLT-2i? yes Taking GLP- 1 RA? no   Reported HTN Regimen: Lisinopril  20 mg daily Metoprolol  tartrate 25 mg twice daily (PAF) Amlodipine  10 mg daily  Checks BP at home intermittently.  Reports home readings are consistently 126-135/75*** mmHg.  Patient denies hypotensive s/sx. No dizziness, lightheadedness.  Patient denies hypertensive symptoms. No headache, chest pain, shortness of breath, visual changes.      _______________________________________________  Objective    Review of Systems:? Limited in the setting of virtual visit  Physical Examination:  Vitals:  Wt Readings from Last 3 Encounters:  07/23/23 190 lb (86.2 kg)  06/30/23 190 lb (86.2 kg)  03/27/23 190 lb 12.8 oz (86.5 kg)   BP Readings from Last 3 Encounters:  06/30/23 130/70  03/27/23 116/70  12/17/22 132/72   Pulse Readings from Last 3 Encounters:  06/30/23 71  03/27/23  68  12/17/22 (!) 57    Labs:?  Lab Results  Component Value Date   HGBA1C 7.8 (H) 06/26/2023   HGBA1C 8.8 (H) 03/25/2023   HGBA1C 8.4 (H) 01/07/2023   GLUCOSE 157 (H) 06/26/2023   MICRALBCREAT 4.0 12/28/2021   MICRALBCREAT 7.2 11/07/2020   MICRALBCREAT 14.7 07/27/2019   CREATININE 1.16 06/26/2023   CREATININE 1.13 03/25/2023   CREATININE 1.16 01/07/2023   GFR 45.08 (L) 06/26/2023   GFR 46.60 (L) 03/25/2023   GFR 45.23 (L) 01/07/2023    Lab Results  Component Value Date   CHOL 124 06/26/2023   LDLCALC 49 06/26/2023   LDLCALC 50 03/25/2023   LDLCALC 14 12/12/2022   LDLDIRECT 74.4 02/21/2012   HDL 56.40 06/26/2023   TRIG 97.0 06/26/2023   TRIG 95.0 03/25/2023   TRIG 199.0 (H) 12/12/2022   ALT 18 06/26/2023   ALT 13 03/25/2023   AST 18 06/26/2023   AST 15 03/25/2023      Chemistry      Component Value Date/Time   NA 140 06/26/2023 0852   K 4.7 06/26/2023 0852   CL 105  06/26/2023 0852   CO2 27 06/26/2023 0852   BUN 35 (H) 06/26/2023 0852   CREATININE 1.16 06/26/2023 0852   CREATININE 1.03 (H) 05/29/2022 1000      Component Value Date/Time   CALCIUM  9.8 06/26/2023 0852   ALKPHOS 94 06/26/2023 0852   AST 18 06/26/2023 0852   ALT 18 06/26/2023 0852   BILITOT 0.4 06/26/2023 0852     The ASCVD Risk score (Arnett DK, et al., 2019) failed to calculate for the following reasons:   The valid total cholesterol range is 130 to 320 mg/dL  Assessment and Plan:   1. Diabetes, type 2: uncontrolled per last A1c of 7.8%, improved from previous 8.8%  Today, reports continued improved adherence with prandial injections as well as metformin  doses. No longer injecting in sites of lipoatrophy. No lows with adhering to correct medication administration. Average BG well controlled at 145-160 mg/dL over the last month which correlated to an estimated A1c ~6.6%-7.2%.  Current Regimen: metformin  1000 mg BID, [name]  25 mg/d, Basaglar  20 units/d, [name]  8-14 units/meal Diet: Has  cut out fried fast foods and sugary drinks. Since last visit, cut out rice. Has potatoes regularly, though more mindful of portions most days.  Exercise: Limited due to mobility (cane/walker). Discussed goals: Walking through large stores with cart, chair exercises, resistance band moves.     Continue medication, no changes today. For PPBG consistently >180 mg/dL 2hPP B/L, ??[name]  1-2 units prior to that meal.  Test claim requested for [facility] 3 Tyler (covered every 5 years, last filled Libre 2 Tyler 2020) Reviewed s/sx/tx hypoglycemia HCM: Overdue: UACR - Elevated >30 mg/g 2023 Future Consideration: GLP1-RA: Avoid given previous intolerance Metformin :  Continue to closely monitor renal function. If eGFR falls below 45 consistently, will need to dose-reduce to 500 mg BID   SU: Defer given risk of hypoglycemia on insulin . Could consider morning dose before 1st meal.  TZD: Avoiding due to possible weight gain/increase in fracture risk. DM >10 yrs.     2. HTN/AFib: controlled based on last clinic visit 116/70 mHg. Goal <130/80 mmHg. Denies lightheadedness, dizziness, SOB, CP, vision changes. Denies any flutters in the past month. HR at home have remained mostly normal. Lowest, occasionally 50s without symptoms. BP at home consistently 120-130/mid 70s mmHg.  Current Regimen: Lisinopril  20 mg/d, metoprolol  tartrate BID, amlodipine  10 mg/d Continue medications without changes.    3. ASCVD (primary prevention): LDL at goal on last lipid panel with LDL 50 mg/dL, TG 95 mh/dL (21/30/86). LDL goal <70 mg/dL (primary prevention, diabetes).  Key risk factors include: diabetes, hypertension (well-controlled), hyperlipidemia (well-controlled), BMI >30 kg/m2, and sedentary lifestyle PREVENT Score: 10-year risk of CVD = 31.1%; 10-year risk of ASCVD = 15.5% Current Regimen: rosuvastatin  5 mg daily Continue medications today without changes    4. Healthcare Maintenance:  Pneumococcal - Current status: Up  to date  Shingles - Current status: No record - DUE Influenza - Current status: Up to date for 2024  Tetanus - DUE Due to receive the following vaccines: Shingrix, Covid Booster, and TDap   Follow Up PCP scheduled 1 month  Follow up with clinical pharmacist via phone ~1 month after next PCP visit  Patient given direct line for questions regarding medication therapy  Future Appointments  Date Time Provider Department Center  07/29/2023  9:00 AM [facility] PHARMACIST [facility]  08/18/2023  1:40 PM [facility] ANNUAL WELLNESS VISIT [facility]  09/17/2023  8:15 AM Elta Halter, MD [facility] None  10/24/2023  9:00  AM Luella Sager, DPM [facility]  10/27/2023  8:15 AM [facility] LAB [facility]  10/30/2023 10:30 AM Dellar Fenton, MD [facility]   Daron Ellen, PharmD Clinical Pharmacist [facility] [phone]

## 2023-08-18 ENCOUNTER — Ambulatory Visit (INDEPENDENT_AMBULATORY_CARE_PROVIDER_SITE_OTHER): Admitting: *Deleted

## 2023-08-18 VITALS — Ht 62.0 in | Wt 191.0 lb

## 2023-08-18 DIAGNOSIS — Z0000 Encounter for general adult medical examination without abnormal findings: Secondary | ICD-10-CM

## 2023-08-18 NOTE — Patient Instructions (Signed)
 Ms. Fiallo , Thank you for taking time out of your busy schedule to complete your Annual Wellness Visit with me. I enjoyed our conversation and look forward to speaking with you again next year. I, as well as your care team,  appreciate your ongoing commitment to your health goals. Please review the following plan we discussed and let me know if I can assist you in the future. Your Game plan/ To Do List    Referrals: If you haven't heard from the office you've been referred to, please reach out to them at the phone provided.  Remember to update your Tetanus (Tdap) and shingles vaccines. Follow up Visits: Next Medicare AWV with our clinical staff: 08/23/24 @ 1:40   Have you seen your provider in the last 6 months (3 months if uncontrolled diabetes)? Yes Next Office Visit with your provider: 10/30/23  Clinician Recommendations:  Aim for 30 minutes of exercise or brisk walking, 6-8 glasses of water, and 5 servings of fruits and vegetables each day.       This is a list of the screening recommended for you and due dates:  Health Maintenance  Topic Date Due   DTaP/Tdap/Td vaccine (1 - Tdap) Never done   Yearly kidney health urinalysis for diabetes  12/29/2022   Zoster (Shingles) Vaccine (1 of 2) 09/30/2023*   Mammogram  10/24/2023   Flu Shot  11/07/2023   Hemoglobin A1C  12/27/2023   Eye exam for diabetics  02/18/2024   Yearly kidney function blood test for diabetes  06/25/2024   Complete foot exam   07/22/2024   Medicare Annual Wellness Visit  08/17/2024   Pneumonia Vaccine  Completed   DEXA scan (bone density measurement)  Completed   Hepatitis C Screening  Completed   HPV Vaccine  Aged Out   Meningitis B Vaccine  Aged Out   Colon Cancer Screening  Discontinued   COVID-19 Vaccine  Discontinued  *Topic was postponed. The date shown is not the original due date.    Advanced directives: (Copy Requested) Please bring a copy of your health care power of attorney and living will to the  office to be added to your chart at your convenience. You can mail to [facility] [address] [place], [place] [address] or email to [email or web address] Advance Care Planning is important because it:  [x]  Makes sure you receive the medical care that is consistent with your values, goals, and preferences  [x]  It provides guidance to your family and loved ones and reduces their decisional burden about whether or not they are making the right decisions based on your wishes.

## 2023-08-18 NOTE — Progress Notes (Signed)
 Subjective:   Beth Tyler is a 78 y.o. who presents for a Medicare Wellness preventive visit.  As a reminder, Annual Wellness Visits don't include a physical exam, and some assessments may be limited, especially if this visit is performed virtually. We may recommend an in-person visit if needed.  Visit Complete: Virtual I connected with  Haroldine Likens on 08/18/23 by a audio enabled telemedicine application and verified that I am speaking with the correct person using two identifiers.  Patient Location: Other:  car  Provider Location: Home Office  I discussed the limitations of evaluation and management by telemedicine. The patient expressed understanding and agreed to proceed.  Vital Signs: Because this visit was a virtual/telehealth visit, some criteria may be missing or patient reported. Any vitals not documented were not able to be obtained and vitals that have been documented are patient reported.  VideoDeclined- This patient declined Librarian, academic. Therefore the visit was completed with audio only.  Persons Participating in Visit: Patient.  AWV Questionnaire: Yes: Patient Medicare AWV questionnaire was completed by the patient on 08/18/23; I have confirmed that all information answered by patient is correct and no changes since this date.  Cardiac Risk Factors include: advanced age (>27men, >53 women);diabetes mellitus;hypertension;dyslipidemia;obesity (BMI >30kg/m2)     Objective:     Today's Vitals   08/18/23 1345  Weight: 191 lb (86.6 kg)  Height: 5\' 2"  (1.575 m)   Body mass index is 34.93 kg/m.     08/18/2023    1:59 PM 08/14/2022    8:21 AM 02/25/2022    6:08 PM 07/04/2021   11:16 AM 04/16/2021    4:32 PM 02/19/2020    8:27 AM 01/28/2020    9:00 AM  Advanced Directives  Does Patient Have a Medical Advance Directive? Yes Yes No No No No Yes  Type of Estate agent of [place];Living will Healthcare Power  of [place];Living will     Living will  Does patient want to make changes to medical advance directive?       No - Patient declined  Copy of Healthcare Power of Attorney in Chart? No - copy requested No - copy requested       Would patient like information on creating a medical advance directive?    No - Patient declined       Current Medications (verified) Outpatient Encounter Medications as of 08/18/2023  Medication Sig   acetaminophen  (TYLENOL ) 500 MG tablet Take 500-1,000 mg by mouth every 6 (six) hours as needed for mild pain or moderate pain.    albuterol  (VENTOLIN  HFA) 108 (90 Base) MCG/ACT inhaler Inhale 2 puffs into the lungs every 4 (four) hours as needed for wheezing or shortness of breath.   albuterol  (VENTOLIN  HFA) 108 (90 Base) MCG/ACT inhaler Inhale 1-2 puffs into the lungs every 6 (six) hours as needed for wheezing or shortness of breath.   amLODipine  (NORVASC ) 10 MG tablet Take 1 tablet (10 mg total) by mouth daily.   apixaban  (ELIQUIS ) 5 MG TABS tablet Take 1 tablet (5 mg total) by mouth 2 (two) times daily.   BD PEN NEEDLE NANO 2ND GEN 32G X 4 MM MISC USE 1 PEN NEEDLE 4 TIMES DAILY   Continuous Glucose Sensor (FREESTYLE LIBRE 2 SENSOR) MISC USE TO CHECK BLOOD SUGARS AT LEAST 4 TIMES A DAY   empagliflozin  ([name] ) 25 MG TABS tablet Take 1 tablet (25 mg total) by mouth daily before breakfast.   ferrous sulfate  325 (65 FE) MG tablet Take 325 mg by mouth daily with breakfast.   fluticasone  (CUTIVATE ) 0.05 % cream Apply 1 Application topically as needed. Use as directed.   glucose blood (ONETOUCH VERIO) test strip USE AS DIRECTED   Insulin  Glargine (BASAGLAR  KWIKPEN) 100 UNIT/ML Inject 22 Units into the skin every morning. Patient assistance   insulin  lispro ([name]  KWIKPEN) 100 UNIT/ML KwikPen Inject 12-16 Units into the skin 3 (three) times daily. Patient assistance   ipratropium (ATROVENT ) 0.06 % nasal spray Place 2 sprays into both nostrils 4 (four) times daily.    lisinopril  (ZESTRIL ) 20 MG tablet Take 1 tablet (20 mg total) by mouth daily.   MAGnesium -Oxide 400 (240 Mg) MG tablet Take 1 tablet (400 mg total) by mouth daily.   metFORMIN  (GLUCOPHAGE -XR) 500 MG 24 hr tablet Take 2 tablets (1,000 mg total) by mouth 2 (two) times daily.   metoprolol  tartrate (LOPRESSOR ) 25 MG tablet Take 1 tablet (25 mg total) by mouth 2 (two) times daily.   mupirocin  ointment (BACTROBAN ) 2 % Apply 1 application. topically 2 (two) times daily.   Pancrelipase , Lip-Prot-Amyl, ([name]  PO) Take by mouth. Take 36,000-72,000 Units by mouth See admin instructions. Take 3 capsules by mouth three times daily with meals and take 2 capsule by mouth daily with snacks   pantoprazole  (PROTONIX ) 40 MG tablet Take 1 tablet (40 mg total) by mouth 2 (two) times daily before a meal.   rosuvastatin  (CRESTOR ) 5 MG tablet Take 1 tablet by mouth once daily   triamcinolone  cream (KENALOG ) 0.1 % APPLY  CREAM EXTERNALLY TWICE DAILY   vitamin B-12 (CYANOCOBALAMIN ) 100 MCG tablet Take 100 mcg by mouth daily.   [DISCONTINUED] Multiple Vitamin (MULTIVITAMIN) tablet Take 1 tablet by mouth daily.   No facility-administered encounter medications on file as of 08/18/2023.    Allergies (verified) Hydrocodone, Hydrocodone-acetaminophen , Oxycodone-acetaminophen , Percocet [oxycodone-acetaminophen ], Semaglutide , Demeclocycline, Other, Penicillins, Tetracyclines & related, and Ampicillin   History: Past Medical History:  Diagnosis Date   Anemia    Arthritis    B12 deficiency    Carpal tunnel syndrome, bilateral    COVID-19 03/18/2019   Diabetes mellitus (HCC) 02/17/2012   Diabetes mellitus, type II (HCC)    Dyspnea    Gastritis    GERD (gastroesophageal reflux disease)    Hypercholesterolemia    Hypertension    IBS (irritable bowel syndrome)    Neuropathy    legs   Pernicious anemia    Vertigo    no episodes fo 9 or 10 yrs   Past Surgical History:  Procedure Laterality Date   ANTERIOR VITRECTOMY  Right 12/01/2017   Procedure: ANTERIOR VITRECTOMY;  Surgeon: Rosa College, MD;  Location: [facility];  Service: Ophthalmology;  Laterality: Right;   BREAST BIOPSY Right 1980   neg   BREAST EXCISIONAL BIOPSY Right 1980   CATARACT EXTRACTION W/PHACO Left 10/13/2017   Procedure: CATARACT EXTRACTION PHACO AND INTRAOCULAR LENS PLACEMENT (IOC)  LEFT  DIABETES;  Surgeon: Rosa College, MD;  Location: [facility];  Service: Ophthalmology;  Laterality: Left;  Diabetic - insulin  and oral meds   CATARACT EXTRACTION W/PHACO Right 12/01/2017   Procedure: CATARACT EXTRACTION PHACO AND INTRAOCULAR LENS PLACEMENT (IOC)  RIGHT DIABETIC;  Surgeon: Rosa College, MD;  Location: [facility];  Service: Ophthalmology;  Laterality: Right;  Diabetic - insulin  and oral   CHOLECYSTECTOMY  1983   Family History  Problem Relation Age of Onset   Diabetes Father    Kidney disease Father  Heart disease Father    Alcohol abuse Mother    Dementia Mother    Diabetes Mother    Diabetes Other    Hemophilia Brother    Uterine cancer Sister    Diabetes Sister    COPD Sister    Breast cancer Maternal Aunt 46   Breast cancer Daughter 25   Stroke Daughter    Diabetes Son    Social History   Socioeconomic History   Marital status: Widowed    Spouse name: Not on file   Number of children: 4   Years of education: Not on file   Highest education level: 12th grade  Occupational History   Not on file  Tobacco Use   Smoking status: Never   Smokeless tobacco: Never  Vaping Use   Vaping status: Never Used  Substance and Sexual Activity   Alcohol use: No    Alcohol/week: 0.0 standard drinks of alcohol   Drug use: No   Sexual activity: Not Currently  Other Topics Concern   Not on file  Social History Narrative   Widowed. Has four children. Manager of an apartment building. No tobacco, alcohol or other drug use   Social Drivers of Corporate investment banker Strain: Low  Risk  (08/18/2023)   Overall Financial Resource Strain (CARDIA)    Difficulty of Paying Living Expenses: Not very hard  Food Insecurity: No Food Insecurity (08/18/2023)   Hunger Vital Sign    Worried About Running Out of Food in the Last Year: Never true    Ran Out of Food in the Last Year: Never true  Transportation Needs: No Transportation Needs (08/18/2023)   PRAPARE - Administrator, Civil Service (Medical): No    Lack of Transportation (Non-Medical): No  Physical Activity: Insufficiently Active (08/18/2023)   Exercise Vital Sign    Days of Exercise per Week: 2 days    Minutes of Exercise per Session: 20 min  Stress: No Stress Concern Present (08/18/2023)   [employer] of Occupational Health - Occupational Stress Questionnaire    Feeling of Stress : Only a little  Social Connections: Moderately Integrated (08/18/2023)   Social Connection and Isolation Panel [NHANES]    Frequency of Communication with Friends and Family: More than three times a week    Frequency of Social Gatherings with Friends and Family: More than three times a week    Attends Religious Services: More than 4 times per year    Active Member of [employer] or Organizations: Yes    Attends Banker Meetings: More than 4 times per year    Marital Status: Widowed    Tobacco Counseling Counseling given: Not Answered    Clinical Intake:  Pre-visit preparation completed: Yes  Pain : No/denies pain     BMI - recorded: 34.93 Nutritional Status: BMI > 30  Obese Nutritional Risks: None Diabetes: Yes CBG done?: No (FBS 136 per patient) Did pt. bring in CBG monitor from home?: No  Lab Results  Component Value Date   HGBA1C 7.8 (H) 06/26/2023   HGBA1C 8.8 (H) 03/25/2023   HGBA1C 8.4 (H) 01/07/2023     How often do you need to have someone help you when you read instructions, pamphlets, or other written materials from your doctor or pharmacy?: 1 - Never  Interpreter Needed?:  No  Information entered by :: R. Shatera Rennert LPN   Activities of Daily Living     08/18/2023    1:48 PM  In your  present state of health, do you have any difficulty performing the following activities:  Hearing? 0  Vision? 0  Difficulty concentrating or making decisions? 1  Walking or climbing stairs? 1  Dressing or bathing? 0  Doing errands, shopping? 0  Preparing Food and eating ? N  Using the Toilet? N  In the past six months, have you accidently leaked urine? Y  Do you have problems with loss of bowel control? Y  Managing your Medications? N  Managing your Finances? N  Housekeeping or managing your Housekeeping? N    Patient Care Team: Dellar Fenton, MD as PCP - General (Internal Medicine) Francina Irish, NP as Nurse Practitioner (Gastroenterology) Barton Like, [facility] (Nephrology)  Indicate any recent Medical Services you may have received from other than [facility] providers in the past year (date may be approximate).     Assessment:    This is a routine wellness examination for [place].  Hearing/Vision screen Hearing Screening - Comments:: No issues Vision Screening - Comments:: glasses   Goals Addressed             This Visit's Progress    Patient Stated       Wants to walk more       Depression Screen     08/18/2023    1:54 PM 06/30/2023    9:46 AM 12/17/2022    9:41 AM 08/14/2022    8:18 AM 04/30/2022   10:09 AM 12/28/2021    9:36 AM 09/21/2021    2:47 PM  PHQ 2/9 Scores  PHQ - 2 Score 0 0 0 0 0 0 0  PHQ- 9 Score 3  0  2      Fall Risk     08/18/2023    1:51 PM 06/30/2023    9:46 AM 12/17/2022    9:40 AM 08/14/2022    8:23 AM 04/30/2022   10:07 AM  Fall Risk   Falls in the past year? 0 0 0 0 0  Number falls in past yr: 0 0 0 0 0  Injury with Fall? 0 0 0 0 0  Risk for fall due to : No Fall Risks No Fall Risks No Fall Risks No Fall Risks No Fall Risks  Follow up Falls prevention discussed;Falls evaluation  completed Falls evaluation completed Falls evaluation completed Falls prevention discussed;Falls evaluation completed Falls evaluation completed    MEDICARE RISK AT HOME:  Medicare Risk at Home Any stairs in or around the home?: Yes If so, are there any without handrails?: No Home free of loose throw rugs in walkways, pet beds, electrical cords, etc?: Yes Adequate lighting in your home to reduce risk of falls?: Yes Life alert?: No Use of a cane, walker or w/c?: Yes Grab bars in the bathroom?: Yes Shower chair or bench in shower?: Yes Elevated toilet seat or a handicapped toilet?: Yes  TIMED UP AND GO:  Was the test performed?  No  Cognitive Function: 6CIT completed    12/09/2017   12:42 PM 04/05/2016    9:49 AM 04/06/2015    9:46 AM  MMSE - Mini Mental State Exam  Orientation to time 5 5 5   Orientation to Place 5 5 5   Registration 3 3 3   Attention/ Calculation 5 5 5   Recall 3 3 3   Language- name 2 objects 2 2 2   Language- repeat 1 1 1   Language- follow 3 step command 3 3 3   Language- read & follow direction 1 1  1  Write a sentence 1 1 1   Copy design 1 1 1   Total score 30 30 30         08/18/2023    2:00 PM 08/14/2022    8:19 AM 01/28/2020    9:00 AM 01/27/2019   10:23 AM  6CIT Screen  What Year? 0 points 0 points 0 points 0 points  What month? 0 points 0 points 0 points 0 points  What time? 0 points 0 points  0 points  Count back from 20 0 points 0 points  0 points  Months in reverse 0 points 0 points 0 points 0 points  Repeat phrase 2 points 0 points  0 points  Total Score 2 points 0 points  0 points    Immunizations Immunization History  Administered Date(s) Administered   Fluad Quad(high Dose 65+) 12/21/2018, 01/28/2020, 01/16/2021, 12/28/2021   Fluad Trivalent(High Dose 65+) 12/17/2022   Influenza Split 02/04/2012   Influenza, High Dose Seasonal PF 12/19/2015, 12/17/2016, 01/08/2018   Influenza,inj,Quad PF,6+ Mos 01/22/2013, 01/19/2014, 12/29/2014    Moderna Sars-Covid-2 Vaccination 07/07/2019   Pneumococcal Conjugate-13 02/06/2010, 06/11/2013   Pneumococcal Polysaccharide-23 02/11/2018    Screening Tests Health Maintenance  Topic Date Due   DTaP/Tdap/Td (1 - Tdap) Never done   Diabetic kidney evaluation - Urine ACR  12/29/2022   Medicare Annual Wellness (AWV)  08/14/2023   Zoster Vaccines- Shingrix (1 of 2) 09/30/2023 (Originally 09/24/1994)   MAMMOGRAM  10/24/2023   INFLUENZA VACCINE  11/07/2023   HEMOGLOBIN A1C  12/27/2023   OPHTHALMOLOGY EXAM  02/18/2024   Diabetic kidney evaluation - eGFR measurement  06/25/2024   FOOT EXAM  07/22/2024   Pneumonia Vaccine 4+ Years old  Completed   DEXA SCAN  Completed   Hepatitis C Screening  Completed   HPV VACCINES  Aged Out   Meningococcal B Vaccine  Aged Out   Colonoscopy  Discontinued   COVID-19 Vaccine  Discontinued    Health Maintenance  Health Maintenance Due  Topic Date Due   DTaP/Tdap/Td (1 - Tdap) Never done   Diabetic kidney evaluation - Urine ACR  12/29/2022   Medicare Annual Wellness (AWV)  08/14/2023   Health Maintenance Items Addressed: Discussed the need to update tetanus (Tdap) and shingles vaccines. Patient wants to discuss Dexa with PCP at next office visit.  Additional Screening:  Vision Screening: Recommended annual ophthalmology exams for early detection of glaucoma and other disorders of the eye.Up to date [facility]  Dental Screening: Recommended annual dental exams for proper oral hygiene  Community Resource Referral / Chronic Care Management: CRR required this visit?  No   CCM required this visit?  No   Plan:    I have personally reviewed and noted the following in the patient's chart:   Medical and social history Use of alcohol, tobacco or illicit drugs  Current medications and supplements including opioid prescriptions. Patient is not currently taking opioid prescriptions. Functional ability and status Nutritional status Physical  activity Advanced directives List of other physicians Hospitalizations, surgeries, and ER visits in previous 12 months Vitals Screenings to include cognitive, depression, and falls Referrals and appointments  In addition, I have reviewed and discussed with patient certain preventive protocols, quality metrics, and best practice recommendations. A written personalized care plan for preventive services as well as general preventive health recommendations were provided to patient.   Felicitas Horse, LPN   1/61/0960   After Visit Summary: (MyChart) Due to this being a telephonic visit, the after visit summary  with patients personalized plan was offered to patient via MyChart   Notes: Nothing significant to report at this time.

## 2023-09-05 DIAGNOSIS — D6869 Other thrombophilia: Secondary | ICD-10-CM | POA: Diagnosis not present

## 2023-09-05 DIAGNOSIS — I700 Atherosclerosis of aorta: Secondary | ICD-10-CM | POA: Diagnosis not present

## 2023-09-05 DIAGNOSIS — Z794 Long term (current) use of insulin: Secondary | ICD-10-CM | POA: Diagnosis not present

## 2023-09-05 DIAGNOSIS — J849 Interstitial pulmonary disease, unspecified: Secondary | ICD-10-CM | POA: Diagnosis not present

## 2023-09-05 DIAGNOSIS — Z8249 Family history of ischemic heart disease and other diseases of the circulatory system: Secondary | ICD-10-CM | POA: Diagnosis not present

## 2023-09-05 DIAGNOSIS — E669 Obesity, unspecified: Secondary | ICD-10-CM | POA: Diagnosis not present

## 2023-09-05 DIAGNOSIS — K219 Gastro-esophageal reflux disease without esophagitis: Secondary | ICD-10-CM | POA: Diagnosis not present

## 2023-09-05 DIAGNOSIS — I4891 Unspecified atrial fibrillation: Secondary | ICD-10-CM | POA: Diagnosis not present

## 2023-09-05 DIAGNOSIS — M1990 Unspecified osteoarthritis, unspecified site: Secondary | ICD-10-CM | POA: Diagnosis not present

## 2023-09-05 DIAGNOSIS — E1142 Type 2 diabetes mellitus with diabetic polyneuropathy: Secondary | ICD-10-CM | POA: Diagnosis not present

## 2023-09-05 DIAGNOSIS — K861 Other chronic pancreatitis: Secondary | ICD-10-CM | POA: Diagnosis not present

## 2023-09-05 DIAGNOSIS — E785 Hyperlipidemia, unspecified: Secondary | ICD-10-CM | POA: Diagnosis not present

## 2023-09-05 DIAGNOSIS — Z008 Encounter for other general examination: Secondary | ICD-10-CM | POA: Diagnosis not present

## 2023-09-17 ENCOUNTER — Ambulatory Visit: Payer: Medicare HMO | Admitting: Dermatology

## 2023-09-23 DIAGNOSIS — Z794 Long term (current) use of insulin: Secondary | ICD-10-CM | POA: Diagnosis not present

## 2023-09-23 DIAGNOSIS — N1831 Chronic kidney disease, stage 3a: Secondary | ICD-10-CM | POA: Diagnosis not present

## 2023-09-23 DIAGNOSIS — E1169 Type 2 diabetes mellitus with other specified complication: Secondary | ICD-10-CM | POA: Diagnosis not present

## 2023-09-23 DIAGNOSIS — E669 Obesity, unspecified: Secondary | ICD-10-CM | POA: Diagnosis not present

## 2023-09-23 DIAGNOSIS — E782 Mixed hyperlipidemia: Secondary | ICD-10-CM | POA: Diagnosis not present

## 2023-09-23 DIAGNOSIS — E1122 Type 2 diabetes mellitus with diabetic chronic kidney disease: Secondary | ICD-10-CM | POA: Diagnosis not present

## 2023-09-23 DIAGNOSIS — E1140 Type 2 diabetes mellitus with diabetic neuropathy, unspecified: Secondary | ICD-10-CM | POA: Diagnosis not present

## 2023-09-23 DIAGNOSIS — I152 Hypertension secondary to endocrine disorders: Secondary | ICD-10-CM | POA: Diagnosis not present

## 2023-09-23 DIAGNOSIS — E1165 Type 2 diabetes mellitus with hyperglycemia: Secondary | ICD-10-CM | POA: Diagnosis not present

## 2023-09-23 DIAGNOSIS — E1159 Type 2 diabetes mellitus with other circulatory complications: Secondary | ICD-10-CM | POA: Diagnosis not present

## 2023-10-24 ENCOUNTER — Ambulatory Visit (INDEPENDENT_AMBULATORY_CARE_PROVIDER_SITE_OTHER): Admitting: Podiatry

## 2023-10-24 DIAGNOSIS — Z91198 Patient's noncompliance with other medical treatment and regimen for other reason: Secondary | ICD-10-CM

## 2023-10-24 NOTE — Progress Notes (Signed)
 1. Failure to attend appointment with reason given    Patient canceled and rescheduled appointment.

## 2023-10-27 ENCOUNTER — Ambulatory Visit: Payer: Self-pay | Admitting: Internal Medicine

## 2023-10-27 ENCOUNTER — Other Ambulatory Visit (INDEPENDENT_AMBULATORY_CARE_PROVIDER_SITE_OTHER)

## 2023-10-27 DIAGNOSIS — Z794 Long term (current) use of insulin: Secondary | ICD-10-CM | POA: Diagnosis not present

## 2023-10-27 DIAGNOSIS — E7800 Pure hypercholesterolemia, unspecified: Secondary | ICD-10-CM | POA: Diagnosis not present

## 2023-10-27 DIAGNOSIS — E1122 Type 2 diabetes mellitus with diabetic chronic kidney disease: Secondary | ICD-10-CM

## 2023-10-27 DIAGNOSIS — N183 Chronic kidney disease, stage 3 unspecified: Secondary | ICD-10-CM | POA: Diagnosis not present

## 2023-10-27 DIAGNOSIS — I10 Essential (primary) hypertension: Secondary | ICD-10-CM

## 2023-10-27 LAB — HEPATIC FUNCTION PANEL
ALT: 11 U/L (ref 0–35)
AST: 12 U/L (ref 0–37)
Albumin: 4.5 g/dL (ref 3.5–5.2)
Alkaline Phosphatase: 67 U/L (ref 39–117)
Bilirubin, Direct: 0.1 mg/dL (ref 0.0–0.3)
Total Bilirubin: 0.5 mg/dL (ref 0.2–1.2)
Total Protein: 6.6 g/dL (ref 6.0–8.3)

## 2023-10-27 LAB — BASIC METABOLIC PANEL WITH GFR
BUN: 29 mg/dL — ABNORMAL HIGH (ref 6–23)
CO2: 27 meq/L (ref 19–32)
Calcium: 9.2 mg/dL (ref 8.4–10.5)
Chloride: 104 meq/L (ref 96–112)
Creatinine, Ser: 0.96 mg/dL (ref 0.40–1.20)
GFR: 56.44 mL/min — ABNORMAL LOW (ref >60.00)
Glucose, Bld: 108 mg/dL — ABNORMAL HIGH (ref 70–99)
Potassium: 4.4 meq/L (ref 3.5–5.1)
Sodium: 140 meq/L (ref 135–145)

## 2023-10-27 LAB — MICROALBUMIN / CREATININE URINE RATIO
Creatinine,U: 54.7 mg/dL
Microalb Creat Ratio: 23.8 mg/g (ref 0.0–30.0)
Microalb, Ur: 1.3 mg/dL (ref 0.0–1.9)

## 2023-10-27 LAB — LIPID PANEL
Cholesterol: 118 mg/dL (ref 0–200)
HDL: 47.5 mg/dL (ref >39.00)
LDL Cholesterol: 37 mg/dL (ref 0–99)
NonHDL: 70.84
Total CHOL/HDL Ratio: 2
Triglycerides: 169 mg/dL — ABNORMAL HIGH (ref 0.0–149.0)
VLDL: 33.8 mg/dL (ref 0.0–40.0)

## 2023-10-27 LAB — HEMOGLOBIN A1C: Hgb A1c MFr Bld: 8.1 % — ABNORMAL HIGH (ref 4.6–6.5)

## 2023-10-30 ENCOUNTER — Ambulatory Visit (INDEPENDENT_AMBULATORY_CARE_PROVIDER_SITE_OTHER): Admitting: Internal Medicine

## 2023-10-30 ENCOUNTER — Encounter: Payer: Self-pay | Admitting: Internal Medicine

## 2023-10-30 VITALS — BP 116/70 | HR 62 | Resp 16 | Ht 62.0 in | Wt 192.0 lb

## 2023-10-30 DIAGNOSIS — D6859 Other primary thrombophilia: Secondary | ICD-10-CM | POA: Diagnosis not present

## 2023-10-30 DIAGNOSIS — I700 Atherosclerosis of aorta: Secondary | ICD-10-CM

## 2023-10-30 DIAGNOSIS — N183 Chronic kidney disease, stage 3 unspecified: Secondary | ICD-10-CM | POA: Diagnosis not present

## 2023-10-30 DIAGNOSIS — I480 Paroxysmal atrial fibrillation: Secondary | ICD-10-CM

## 2023-10-30 DIAGNOSIS — Z1231 Encounter for screening mammogram for malignant neoplasm of breast: Secondary | ICD-10-CM

## 2023-10-30 DIAGNOSIS — E7800 Pure hypercholesterolemia, unspecified: Secondary | ICD-10-CM | POA: Diagnosis not present

## 2023-10-30 DIAGNOSIS — Z794 Long term (current) use of insulin: Secondary | ICD-10-CM

## 2023-10-30 DIAGNOSIS — I10 Essential (primary) hypertension: Secondary | ICD-10-CM | POA: Diagnosis not present

## 2023-10-30 DIAGNOSIS — E1122 Type 2 diabetes mellitus with diabetic chronic kidney disease: Secondary | ICD-10-CM

## 2023-10-30 NOTE — Progress Notes (Signed)
 Subjective:    Patient ID: Webster CHRISTELLA Payment, female    DOB: 11/28/1945, 78 y.o.   MRN: [id number]  Patient here for  Chief Complaint  Patient presents with   Medical Management of Chronic Issues    HPI Here for a scheduled follow up -  follow up regarding diabetes, hypercholesterolemia and hypertension. On jardiance , basaglar , humalog , metformin  for her diabetes. Had GI issues with 14mg  dose of rybelsus . Off rybelsus . Had f/u with cardiology 05/13/23 - continue eliquis , simvastatin . Stable. Breathing stable. Saw endocrinology 09/23/23 - recommended to continue basaglar  20 units daily and humalog  8-12 units with meals. Reviewed CGM report - time active 96% - target range 85%, high 12%, low 3%. She has recently started 14 units humalog  with each meal. Also started keto diet. Sugars have improved recently. Bowels - stable.    Past Medical History:  Diagnosis Date   Anemia    Arthritis    B12 deficiency    Carpal tunnel syndrome, bilateral    COVID-19 03/18/2019   Diabetes mellitus (HCC) 02/17/2012   Diabetes mellitus, type II (HCC)    Dyspnea    Gastritis    GERD (gastroesophageal reflux disease)    Hypercholesterolemia    Hypertension    IBS (irritable bowel syndrome)    Neuropathy    legs   Pernicious anemia    Vertigo    no episodes fo 9 or 10 yrs   Past Surgical History:  Procedure Laterality Date   ANTERIOR VITRECTOMY Right 12/01/2017   Procedure: ANTERIOR VITRECTOMY;  Surgeon: Myrna Adine Anes, MD;  Location: [facility];  Service: Ophthalmology;  Laterality: Right;   BREAST BIOPSY Right 1980   neg   BREAST EXCISIONAL BIOPSY Right 1980   CATARACT EXTRACTION W/PHACO Left 10/13/2017   Procedure: CATARACT EXTRACTION PHACO AND INTRAOCULAR LENS PLACEMENT (IOC)  LEFT  DIABETES;  Surgeon: Myrna Adine Anes, MD;  Location: [facility];  Service: Ophthalmology;  Laterality: Left;  Diabetic - insulin  and oral meds   CATARACT EXTRACTION W/PHACO Right 12/01/2017    Procedure: CATARACT EXTRACTION PHACO AND INTRAOCULAR LENS PLACEMENT (IOC)  RIGHT DIABETIC;  Surgeon: Myrna Adine Anes, MD;  Location: [facility];  Service: Ophthalmology;  Laterality: Right;  Diabetic - insulin  and oral   CHOLECYSTECTOMY  1983   Family History  Problem Relation Age of Onset   Diabetes Father    Kidney disease Father    Heart disease Father    Alcohol abuse Mother    Dementia Mother    Diabetes Mother    Diabetes Other    Hemophilia Brother    Uterine cancer Sister    Diabetes Sister    COPD Sister    Breast cancer Maternal Aunt 55   Breast cancer Daughter 2   Stroke Daughter    Diabetes Son    Social History   Socioeconomic History   Marital status: Widowed    Spouse name: Not on file   Number of children: 4   Years of education: Not on file   Highest education level: 12th grade  Occupational History   Not on file  Tobacco Use   Smoking status: Never   Smokeless tobacco: Never  Vaping Use   Vaping status: Never Used  Substance and Sexual Activity   Alcohol use: No    Alcohol/week: 0.0 standard drinks of alcohol   Drug use: No   Sexual activity: Not Currently  Other Topics Concern   Not on file  Social History Narrative  Widowed. Has four children. Manager of an apartment building. No tobacco, alcohol or other drug use   Social Drivers of Corporate investment banker Strain: Low Risk  (10/29/2023)   Overall Financial Resource Strain (CARDIA)    Difficulty of Paying Living Expenses: Not very hard  Food Insecurity: No Food Insecurity (10/29/2023)   Hunger Vital Sign    Worried About Running Out of Food in the Last Year: Never true    Ran Out of Food in the Last Year: Never true  Transportation Needs: No Transportation Needs (10/29/2023)   PRAPARE - Administrator, Civil Service (Medical): No    Lack of Transportation (Non-Medical): No  Physical Activity: Inactive (10/29/2023)   Exercise Vital Sign    Days of Exercise per  Week: 0 days    Minutes of Exercise per Session: Not on file  Stress: No Stress Concern Present (10/29/2023)   [employer] of Occupational Health - Occupational Stress Questionnaire    Feeling of Stress: Only a little  Social Connections: Moderately Integrated (10/29/2023)   Social Connection and Isolation Panel    Frequency of Communication with Friends and Family: More than three times a week    Frequency of Social Gatherings with Friends and Family: More than three times a week    Attends Religious Services: More than 4 times per year    Active Member of [employer] or Organizations: Yes    Attends Banker Meetings: More than 4 times per year    Marital Status: Widowed     Review of Systems  Constitutional:  Negative for appetite change and unexpected weight change.  HENT:  Negative for congestion and sinus pressure.   Respiratory:  Negative for cough, chest tightness and shortness of breath.   Cardiovascular:  Negative for chest pain and palpitations.       No increased swelling.   Gastrointestinal:  Negative for abdominal pain, nausea and vomiting.       Persistent bowel issues   Genitourinary:  Negative for difficulty urinating and dysuria.  Musculoskeletal:  Negative for joint swelling and myalgias.  Skin:  Negative for color change and rash.  Neurological:  Negative for dizziness and headaches.  Psychiatric/Behavioral:  Negative for agitation and dysphoric mood.        Objective:     BP 116/70   Pulse 62   Resp 16   Ht 5' 2 (1.575 m)   Wt 192 lb (87.1 kg)   SpO2 98%   BMI 35.12 kg/m  Wt Readings from Last 3 Encounters:  10/30/23 192 lb (87.1 kg)  08/18/23 191 lb (86.6 kg)  07/23/23 190 lb (86.2 kg)    Physical Exam Vitals reviewed.  Constitutional:      General: She is not in acute distress.    Appearance: Normal appearance.  HENT:     Head: Normocephalic and atraumatic.     Right Ear: External ear normal.     Left Ear: External ear normal.      Mouth/Throat:     Pharynx: No oropharyngeal exudate or posterior oropharyngeal erythema.  Eyes:     General: No scleral icterus.       Right eye: No discharge.        Left eye: No discharge.     Conjunctiva/sclera: Conjunctivae normal.  Neck:     Thyroid : No thyromegaly.  Cardiovascular:     Rate and Rhythm: Normal rate and regular rhythm.  Pulmonary:     Effort: No  respiratory distress.     Breath sounds: Normal breath sounds. No wheezing.  Abdominal:     General: Bowel sounds are normal.     Palpations: Abdomen is soft.     Tenderness: There is no abdominal tenderness.  Musculoskeletal:        General: No swelling or tenderness.     Cervical back: Neck supple. No tenderness.  Lymphadenopathy:     Cervical: No cervical adenopathy.  Skin:    Findings: No erythema or rash.  Neurological:     Mental Status: She is alert.  Psychiatric:        Mood and Affect: Mood normal.        Behavior: Behavior normal.         Outpatient Encounter Medications as of 10/30/2023  Medication Sig   Insulin  Glargine (BASAGLAR  KWIKPEN) 100 UNIT/ML Inject 20 Units into the skin daily.   acetaminophen  (TYLENOL ) 500 MG tablet Take 500-1,000 mg by mouth every 6 (six) hours as needed for mild pain or moderate pain.    albuterol  (VENTOLIN  HFA) 108 (90 Base) MCG/ACT inhaler Inhale 2 puffs into the lungs every 4 (four) hours as needed for wheezing or shortness of breath.   albuterol  (VENTOLIN  HFA) 108 (90 Base) MCG/ACT inhaler Inhale 1-2 puffs into the lungs every 6 (six) hours as needed for wheezing or shortness of breath.   amLODipine  (NORVASC ) 10 MG tablet Take 1 tablet (10 mg total) by mouth daily.   apixaban  (ELIQUIS ) 5 MG TABS tablet Take 1 tablet (5 mg total) by mouth 2 (two) times daily.   BD PEN NEEDLE NANO 2ND GEN 32G X 4 MM MISC USE 1 PEN NEEDLE 4 TIMES DAILY   Continuous Glucose Sensor (FREESTYLE LIBRE 2 SENSOR) MISC USE TO CHECK BLOOD SUGARS AT LEAST 4 TIMES A DAY   empagliflozin   ([name] ) 25 MG TABS tablet Take 1 tablet (25 mg total) by mouth daily before breakfast.   ferrous sulfate 325 (65 FE) MG tablet Take 325 mg by mouth daily with breakfast.   fluticasone  (CUTIVATE ) 0.05 % cream Apply 1 Application topically as needed. Use as directed.   glucose blood (ONETOUCH VERIO) test strip USE AS DIRECTED   insulin  lispro ([name]  KWIKPEN) 100 UNIT/ML KwikPen Inject 12-16 Units into the skin 3 (three) times daily. Patient assistance   ipratropium (ATROVENT ) 0.06 % nasal spray Place 2 sprays into both nostrils 4 (four) times daily.   lisinopril  (ZESTRIL ) 20 MG tablet Take 1 tablet (20 mg total) by mouth daily.   MAGnesium -Oxide 400 (240 Mg) MG tablet Take 1 tablet (400 mg total) by mouth daily.   metFORMIN  (GLUCOPHAGE -XR) 500 MG 24 hr tablet Take 2 tablets (1,000 mg total) by mouth 2 (two) times daily.   metoprolol  tartrate (LOPRESSOR ) 25 MG tablet Take 1 tablet (25 mg total) by mouth 2 (two) times daily.   mupirocin  ointment (BACTROBAN ) 2 % Apply 1 application. topically 2 (two) times daily.   Pancrelipase , Lip-Prot-Amyl, ([name]  PO) Take by mouth. Take 36,000-72,000 Units by mouth See admin instructions. Take 3 capsules by mouth three times daily with meals and take 2 capsule by mouth daily with snacks   pantoprazole  (PROTONIX ) 40 MG tablet Take 1 tablet (40 mg total) by mouth 2 (two) times daily before a meal.   rosuvastatin  (CRESTOR ) 5 MG tablet Take 1 tablet by mouth once daily   triamcinolone  cream (KENALOG ) 0.1 % APPLY  CREAM EXTERNALLY TWICE DAILY   vitamin B-12 (CYANOCOBALAMIN ) 100 MCG tablet Take 100 mcg by  mouth daily.   [DISCONTINUED] Insulin  Glargine (BASAGLAR  KWIKPEN) 100 UNIT/ML Inject 22 Units into the skin every morning. Patient assistance   No facility-administered encounter medications on file as of 10/30/2023.     Lab Results  Component Value Date   WBC 7.7 06/26/2023   HGB 13.3 06/26/2023   HCT 41.1 06/26/2023   PLT 221.0 06/26/2023   GLUCOSE 108  (H) 10/27/2023   CHOL 118 10/27/2023   TRIG 169.0 (H) 10/27/2023   HDL 47.50 10/27/2023   LDLDIRECT 74.4 02/21/2012   LDLCALC 37 10/27/2023   ALT 11 10/27/2023   AST 12 10/27/2023   NA 140 10/27/2023   K 4.4 10/27/2023   CL 104 10/27/2023   CREATININE 0.96 10/27/2023   BUN 29 (H) 10/27/2023   CO2 27 10/27/2023   TSH 1.27 06/26/2023   INR 1.2 07/04/2021   HGBA1C 8.1 (H) 10/27/2023   MICROALBUR 1.3 10/27/2023    MM 3D SCREENING MAMMOGRAM BILATERAL BREAST Result Date: 10/25/2022 CLINICAL DATA:  Screening. EXAM: DIGITAL SCREENING BILATERAL MAMMOGRAM WITH TOMOSYNTHESIS AND CAD TECHNIQUE: Bilateral screening digital craniocaudal and mediolateral oblique mammograms were obtained. Bilateral screening digital breast tomosynthesis was performed. The images were evaluated with computer-aided detection. COMPARISON:  Previous exam(s). ACR Breast Density Category b: There are scattered areas of fibroglandular density. FINDINGS: There are no findings suspicious for malignancy. IMPRESSION: No mammographic evidence of malignancy. A result letter of this screening mammogram will be mailed directly to the patient. RECOMMENDATION: Screening mammogram in one year. (Code:SM-B-01Y) BI-RADS CATEGORY  1: Negative. Electronically Signed   By: Alm Parkins M.D.   On: 10/25/2022 15:31       Assessment & Plan:  Visit for screening mammogram -     3D Screening Mammogram, Left and Right; Future  Benign essential hypertension Assessment & Plan: Continue metoprolol  and amlodipine . On lisinopril  20mg  q day.  Blood pressure as outlined. Hold on making changes in medication. Follow pressures. Follow metabolic panel.   Orders: -     Basic metabolic panel with GFR; Future  Hypercholesterolemia Assessment & Plan: Continue crestor .  Low cholesterol diet and exercise.  Follow lipid panel.   Orders: -     Hepatic function panel; Future -     Lipid panel; Future  Type 2 diabetes mellitus with stage 3 chronic  kidney disease, with long-term current use of insulin , unspecified whether stage 3a or 3b CKD (HCC) Assessment & Plan: Discussed diet and exercise. On jardiance , basaglar , humalog , metformin  for her diabetes. Had GI issues with 14mg  dose of rybelsus .  Saw endocrinology 09/23/23 - recommended to continue basaglar  20 units daily and humalog  8-12 units with meals. She has been giving herself 14 units humalog  with each meal. Discussed recent blood sugars. She has started keto diet. Sugars are coming down. Discussed not overshooting with her short acting insulin  and to dose based on what she is eating - not just a standard dose.   Orders: -     Hemoglobin A1c; Future  Thrombophilia (HCC) Assessment & Plan: Afib on eliquis .    Paroxysmal A-fib (HCC) Assessment & Plan: Continue metoprolol  and eliquis .  No changes in medication. Stable.    Aortic atherosclerosis (HCC) Assessment & Plan: Continue crestor .       Allena Hamilton, MD

## 2023-10-31 ENCOUNTER — Encounter: Payer: Self-pay | Admitting: Podiatry

## 2023-10-31 ENCOUNTER — Ambulatory Visit (INDEPENDENT_AMBULATORY_CARE_PROVIDER_SITE_OTHER): Admitting: Podiatry

## 2023-10-31 DIAGNOSIS — M79675 Pain in left toe(s): Secondary | ICD-10-CM | POA: Diagnosis not present

## 2023-10-31 DIAGNOSIS — B351 Tinea unguium: Secondary | ICD-10-CM | POA: Diagnosis not present

## 2023-10-31 DIAGNOSIS — M79674 Pain in right toe(s): Secondary | ICD-10-CM

## 2023-10-31 DIAGNOSIS — E1122 Type 2 diabetes mellitus with diabetic chronic kidney disease: Secondary | ICD-10-CM | POA: Diagnosis not present

## 2023-10-31 DIAGNOSIS — N183 Chronic kidney disease, stage 3 unspecified: Secondary | ICD-10-CM

## 2023-10-31 DIAGNOSIS — Z794 Long term (current) use of insulin: Secondary | ICD-10-CM

## 2023-11-02 ENCOUNTER — Encounter: Payer: Self-pay | Admitting: Internal Medicine

## 2023-11-02 NOTE — Assessment & Plan Note (Signed)
Continue crestor.  Low cholesterol diet and exercise.  Follow lipid panel.  

## 2023-11-02 NOTE — Assessment & Plan Note (Signed)
Afib on eliquis.

## 2023-11-02 NOTE — Assessment & Plan Note (Signed)
 Discussed diet and exercise. On jardiance , basaglar , humalog , metformin  for her diabetes. Had GI issues with 14mg  dose of rybelsus .  Saw endocrinology 09/23/23 - recommended to continue basaglar  20 units daily and humalog  8-12 units with meals. She has been giving herself 14 units humalog  with each meal. Discussed recent blood sugars. She has started keto diet. Sugars are coming down. Discussed not overshooting with her short acting insulin  and to dose based on what she is eating - not just a standard dose.

## 2023-11-02 NOTE — Assessment & Plan Note (Signed)
 Continue metoprolol and amlodipine. On lisinopril 20mg  q day.  Blood pressure as outlined. Hold on making changes in medication. Follow pressures. Follow metabolic panel.

## 2023-11-02 NOTE — Assessment & Plan Note (Signed)
 Continue crestor

## 2023-11-02 NOTE — Assessment & Plan Note (Signed)
 Continue metoprolol and eliquis.  No changes in medication. Stable.

## 2023-11-03 NOTE — Progress Notes (Signed)
 Subjective:  Patient ID: Beth Tyler, female    DOB: October 04, 1945,  MRN: [id number]  Beth Tyler presents to clinic today for at risk foot care. Pt has h/o NIDDM with chronic kidney disease and painful thick toenails that are difficult to trim. Pain interferes with ambulation. Aggravating factors include wearing enclosed shoe gear. Pain is relieved with periodic professional debridement.  Chief Complaint  Patient presents with   Nail Problem    Thick painful toenails, 3 month follow up    New problem(s): None.   PCP is Glendia Shad, MD. Beth Tyler 06/30/2023.  Allergies  Allergen Reactions   Hydrocodone Anaphylaxis   Hydrocodone-Acetaminophen  Anaphylaxis   Oxycodone-Acetaminophen  Anaphylaxis and Other (See Comments)   Percocet [Oxycodone-Acetaminophen ] Anaphylaxis   Semaglutide  Nausea And Vomiting    Severe abdominal upset/pain with Rybelsus  7 mg   Demeclocycline Nausea Only   Other Nausea Only and Other (See Comments)   Penicillins Rash    Did it involve swelling of the face/tongue/throat, SOB, or low BP? No Did it involve sudden or severe rash/hives, skin peeling, or any reaction on the inside of your mouth or nose? No Did you need to seek medical attention at a hospital or doctor's office? No When did it last happen?       If all above answers are NO, may proceed with cephalosporin use.   Tetracyclines & Related Nausea Only   Ampicillin Itching and Rash    Review of Systems: Negative except as noted in the HPI.  Objective: No changes noted in today's physical examination. There were no vitals filed for this visit. Beth Tyler is a pleasant 78 y.o. female in NAD. AAO x 3.  Vascular Examination: Capillary refill time immediate b/l. Vascular status intact b/l with palpable pedal pulses. Pedal hair present b/l. No pain with calf compression b/l. Skin temperature gradient WNL b/l. No cyanosis or clubbing b/l. No ischemia or gangrene noted b/l. No edema noted b/l  LE.  Neurological Examination: Protective sensation diminished with 10g monofilament b/l.  Dermatological Examination: Pedal skin with normal turgor, texture and tone b/l.  No open wounds. No interdigital macerations.   Toenails 1-5 b/l thick, discolored, elongated with subungual debris and pain on dorsal palpation.   No hyperkeratotic nor porokeratotic lesions present on today's visit.  Musculoskeletal Examination: Muscle strength 5/5 to all lower extremity muscle groups bilaterally. No pain, crepitus or joint limitation noted with ROM bilateral LE. No gross bony deformities bilaterally.  Radiographs: None  Last A1c:      Latest Ref Rng & Units 10/27/2023    8:13 AM 06/26/2023    8:52 AM 03/25/2023    8:19 AM 01/07/2023   10:09 AM 12/12/2022   10:49 AM  Hemoglobin A1C  Hemoglobin-A1c 4.6 - 6.5 % 8.1  7.8  8.8  8.4  8.5     Assessment/Plan: 1. Pain due to onychomycosis of toenails of both feet   2. Type 2 diabetes mellitus with stage 3 chronic kidney disease, with long-term current use of insulin , unspecified whether stage 3a or 3b CKD (HCC)    Patient was evaluated and treated. All patient's and/or POA's questions/concerns addressed on today's visit. Toenails 1-5 debrided in length and girth without incident. Continue foot and shoe inspections daily. Monitor blood glucose per PCP/Endocrinologist's recommendations. Continue soft, supportive shoe gear daily. Report any pedal injuries to medical professional. Call office if there are any questions/concerns. -Patient/POA to call should there be question/concern in the interim.   Return in about 3  months (around 01/31/2024).  Beth Tyler, DPM      [place] LOCATION: [address], [place] [address]                   Office [phone]   [facility] LOCATION: [address] [place], [place] [address] Office [phone]

## 2023-11-18 ENCOUNTER — Other Ambulatory Visit: Payer: Self-pay | Admitting: Internal Medicine

## 2023-11-18 DIAGNOSIS — E1122 Type 2 diabetes mellitus with diabetic chronic kidney disease: Secondary | ICD-10-CM

## 2023-11-18 DIAGNOSIS — Z794 Long term (current) use of insulin: Secondary | ICD-10-CM

## 2023-11-21 ENCOUNTER — Other Ambulatory Visit: Payer: Self-pay | Admitting: Internal Medicine

## 2023-11-24 NOTE — Telephone Encounter (Signed)
 Sueanne - per discussion/ Per review - Lindsay's note - she is getting assistance for [name]  - [employer] Cares ([name] ) - Approved through 2025. Please clarify and let me know what is needed. Note states requesting medication.

## 2023-11-24 NOTE — Telephone Encounter (Signed)
 Pt gets her medication from pt assistance. Refill sent to patient assistance pharmacy- [facility]

## 2023-12-04 ENCOUNTER — Ambulatory Visit

## 2023-12-16 DIAGNOSIS — E7800 Pure hypercholesterolemia, unspecified: Secondary | ICD-10-CM | POA: Diagnosis not present

## 2023-12-16 DIAGNOSIS — J849 Interstitial pulmonary disease, unspecified: Secondary | ICD-10-CM | POA: Diagnosis not present

## 2023-12-16 DIAGNOSIS — I10 Essential (primary) hypertension: Secondary | ICD-10-CM | POA: Diagnosis not present

## 2023-12-16 DIAGNOSIS — R0602 Shortness of breath: Secondary | ICD-10-CM | POA: Diagnosis not present

## 2023-12-16 DIAGNOSIS — I480 Paroxysmal atrial fibrillation: Secondary | ICD-10-CM | POA: Diagnosis not present

## 2024-01-01 DIAGNOSIS — J849 Interstitial pulmonary disease, unspecified: Secondary | ICD-10-CM | POA: Diagnosis not present

## 2024-01-01 DIAGNOSIS — R053 Chronic cough: Secondary | ICD-10-CM | POA: Diagnosis not present

## 2024-01-01 DIAGNOSIS — N1831 Chronic kidney disease, stage 3a: Secondary | ICD-10-CM | POA: Diagnosis not present

## 2024-01-06 NOTE — Progress Notes (Signed)
 Beth Tyler                                          MRN: [id number]   01/06/2024   The [facility]BCI Quality Team Specialist reviewed this patient medical record for the purposes of chart review for care gap closure. The following were reviewed: abstraction for care gap closure-kidney health evaluation for diabetes:eGFR  and uACR.    VBCI Quality Team

## 2024-01-20 ENCOUNTER — Other Ambulatory Visit: Payer: Self-pay | Admitting: Internal Medicine

## 2024-01-25 ENCOUNTER — Other Ambulatory Visit: Payer: Self-pay | Admitting: Internal Medicine

## 2024-01-28 ENCOUNTER — Other Ambulatory Visit: Payer: Self-pay | Admitting: Internal Medicine

## 2024-01-30 ENCOUNTER — Ambulatory Visit: Admitting: Podiatry

## 2024-02-02 ENCOUNTER — Ambulatory Visit (INDEPENDENT_AMBULATORY_CARE_PROVIDER_SITE_OTHER): Admitting: Podiatry

## 2024-02-02 DIAGNOSIS — Z91198 Patient's noncompliance with other medical treatment and regimen for other reason: Secondary | ICD-10-CM

## 2024-02-02 NOTE — Progress Notes (Signed)
 1. Failure to attend appointment with reason given    Rescheduled by patient.

## 2024-02-06 ENCOUNTER — Telehealth: Payer: Self-pay

## 2024-02-06 NOTE — Telephone Encounter (Signed)
 PAP: Patient assistance application for [name] through [employer]) has been mailed to pt's home address on file. Provider portion of application will be faxed to provider's office.

## 2024-02-06 NOTE — Telephone Encounter (Signed)
 PAP: Patient assistance application for Basaglar  and [name]  through [employer] has been mailed to pt's home address on file. Provider portion of application will be faxed to provider's office.

## 2024-02-06 NOTE — Telephone Encounter (Signed)
 PAP: Patient assistance application for [name]  through [name]) has been mailed to pt's home address on file. Provider portion of application will be faxed to provider's office.

## 2024-02-06 NOTE — Telephone Encounter (Signed)
 Copied from CRM #[id number]. Topic: Clinical - Medication Question >> Feb 06, 2024  8:18 AM Larissa RAMAN wrote: Reason for CRM: Patient requesting a callback to follow-up up with patient assistance programs for her medications.  Callback [phone]

## 2024-02-10 NOTE — Telephone Encounter (Signed)
 Received all forms for patient assistance. Placed in folder

## 2024-02-10 NOTE — Telephone Encounter (Signed)
 Signed and placed in box.

## 2024-02-11 NOTE — Telephone Encounter (Signed)
 Form faxed and sent to scan

## 2024-02-12 NOTE — Telephone Encounter (Signed)
 Received provider portion PAP application for [name]  marionette)

## 2024-02-12 NOTE — Telephone Encounter (Signed)
 Received Provider portion PAP application [name] for Basaglar  & [name] 

## 2024-02-12 NOTE — Telephone Encounter (Signed)
 Received provider portion PAP application for [name]  (BI)

## 2024-02-13 ENCOUNTER — Other Ambulatory Visit

## 2024-02-13 ENCOUNTER — Encounter: Payer: Self-pay | Admitting: Pharmacist

## 2024-02-13 NOTE — Progress Notes (Signed)
 Chart Review Reason: Patient assistance  Summary: Provider signature needed on [name] 2026 application.  Received from PAP team, uploaded to [facility] eFax folder for review and signature.   Once signed, CMA may fax ot [facility] PAP team at [phone]. Pool cc'd

## 2024-02-14 NOTE — Progress Notes (Signed)
Need form

## 2024-02-16 ENCOUNTER — Other Ambulatory Visit

## 2024-02-16 DIAGNOSIS — E1122 Type 2 diabetes mellitus with diabetic chronic kidney disease: Secondary | ICD-10-CM | POA: Diagnosis not present

## 2024-02-16 DIAGNOSIS — Z794 Long term (current) use of insulin: Secondary | ICD-10-CM | POA: Diagnosis not present

## 2024-02-16 DIAGNOSIS — N183 Chronic kidney disease, stage 3 unspecified: Secondary | ICD-10-CM | POA: Diagnosis not present

## 2024-02-16 DIAGNOSIS — E7800 Pure hypercholesterolemia, unspecified: Secondary | ICD-10-CM

## 2024-02-16 DIAGNOSIS — I10 Essential (primary) hypertension: Secondary | ICD-10-CM | POA: Diagnosis not present

## 2024-02-16 LAB — BASIC METABOLIC PANEL WITH GFR
BUN: 24 mg/dL — ABNORMAL HIGH (ref 6–23)
CO2: 26 meq/L (ref 19–32)
Calcium: 8.9 mg/dL (ref 8.4–10.5)
Chloride: 104 meq/L (ref 96–112)
Creatinine, Ser: 1.03 mg/dL (ref 0.40–1.20)
GFR: 51.76 mL/min — ABNORMAL LOW (ref >60.00)
Glucose, Bld: 111 mg/dL — ABNORMAL HIGH (ref 70–99)
Potassium: 4.9 meq/L (ref 3.5–5.1)
Sodium: 138 meq/L (ref 135–145)

## 2024-02-16 LAB — HEPATIC FUNCTION PANEL
ALT: 13 U/L (ref 0–35)
AST: 15 U/L (ref 0–37)
Albumin: 4.4 g/dL (ref 3.5–5.2)
Alkaline Phosphatase: 66 U/L (ref 39–117)
Bilirubin, Direct: 0.2 mg/dL (ref 0.0–0.3)
Total Bilirubin: 0.7 mg/dL (ref 0.2–1.2)
Total Protein: 6.6 g/dL (ref 6.0–8.3)

## 2024-02-16 LAB — HEMOGLOBIN A1C: Hgb A1c MFr Bld: 8.4 % — ABNORMAL HIGH (ref 4.6–6.5)

## 2024-02-16 LAB — LIPID PANEL
Cholesterol: 87 mg/dL (ref 0–200)
HDL: 42 mg/dL (ref >39.00)
LDL Cholesterol: 23 mg/dL (ref 0–99)
NonHDL: 45.09
Total CHOL/HDL Ratio: 2
Triglycerides: 110 mg/dL (ref 0.0–149.0)
VLDL: 22 mg/dL (ref 0.0–40.0)

## 2024-02-16 NOTE — Progress Notes (Signed)
 Printed off placed in provider folder for signature

## 2024-02-17 ENCOUNTER — Ambulatory Visit: Payer: Self-pay | Admitting: Internal Medicine

## 2024-02-17 NOTE — Progress Notes (Signed)
Noted forms faxed

## 2024-02-17 NOTE — Progress Notes (Signed)
 Signed and placed in box.

## 2024-02-20 ENCOUNTER — Ambulatory Visit: Admitting: Internal Medicine

## 2024-02-20 ENCOUNTER — Telehealth: Payer: Self-pay

## 2024-02-20 VITALS — BP 138/60 | HR 53 | Temp 97.9°F | Ht 60.5 in | Wt 187.2 lb

## 2024-02-20 DIAGNOSIS — E1122 Type 2 diabetes mellitus with diabetic chronic kidney disease: Secondary | ICD-10-CM | POA: Diagnosis not present

## 2024-02-20 DIAGNOSIS — N183 Chronic kidney disease, stage 3 unspecified: Secondary | ICD-10-CM | POA: Diagnosis not present

## 2024-02-20 DIAGNOSIS — I10 Essential (primary) hypertension: Secondary | ICD-10-CM | POA: Diagnosis not present

## 2024-02-20 DIAGNOSIS — Z23 Encounter for immunization: Secondary | ICD-10-CM

## 2024-02-20 DIAGNOSIS — E7800 Pure hypercholesterolemia, unspecified: Secondary | ICD-10-CM

## 2024-02-20 DIAGNOSIS — I480 Paroxysmal atrial fibrillation: Secondary | ICD-10-CM | POA: Diagnosis not present

## 2024-02-20 DIAGNOSIS — D6859 Other primary thrombophilia: Secondary | ICD-10-CM | POA: Diagnosis not present

## 2024-02-20 DIAGNOSIS — Z794 Long term (current) use of insulin: Secondary | ICD-10-CM

## 2024-02-20 DIAGNOSIS — Z0000 Encounter for general adult medical examination without abnormal findings: Secondary | ICD-10-CM

## 2024-02-20 DIAGNOSIS — E2839 Other primary ovarian failure: Secondary | ICD-10-CM | POA: Diagnosis not present

## 2024-02-20 MED ORDER — FREESTYLE LIBRE 3 READER DEVI: 0 refills | Status: DC

## 2024-02-20 MED ORDER — FREESTYLE LIBRE 3 PLUS SENSOR MISC
3 refills | Status: DC
Start: 2024-02-20 — End: 2024-02-28

## 2024-02-20 NOTE — Progress Notes (Signed)
 Subjective:    Patient ID: Beth Tyler, female    DOB: 10/16/1945, 78 y.o.   MRN: [id number]  Patient here for  Chief Complaint  Patient presents with   Annual Exam    CPE & review labs    HPI Here for a physical exam. Saw pulmonary 01/01/24 - chronic cough and intermittent dyspnea. Recommended albuterol  inhaler prn. Also schedule lung function testing. Had f/u with cardiology 12/16/23 - continue metoprolol  and eliquis . Referred to pulmonary - appt as outlined above. Breathing stable. On jardiance , basaglar , humalog , metformin  for her diabetes. Had GI issues with 14mg  dose of rybelsus . Off rybelsus . Saw endocrinology 09/23/23 - recommended to continue basaglar  20 units daily and humalog  8-12 units with meals. Reviewed CGM report - time active 77% - target range 59%, high 30%, low 0%. On average takes 12-15 units humalog  with each meal. Has been doing better with taking her insulin  as scheduled. Sugars have improved recently. Discussed recent A1c - 8.4. increased. Discussed f/u with pharmacy [facility]) to help with her diabetes management.    Past Medical History:  Diagnosis Date   Anemia    Arthritis    B12 deficiency    Carpal tunnel syndrome, bilateral    COVID-19 03/18/2019   Diabetes mellitus (HCC) 02/17/2012   Diabetes mellitus, type II (HCC)    Dyspnea    Gastritis    GERD (gastroesophageal reflux disease)    Hypercholesterolemia    Hypertension    IBS (irritable bowel syndrome)    Neuropathy    legs   Pernicious anemia    Vertigo    no episodes fo 9 or 10 yrs   Past Surgical History:  Procedure Laterality Date   ANTERIOR VITRECTOMY Right 12/01/2017   Procedure: ANTERIOR VITRECTOMY;  Surgeon: Myrna Adine Anes, MD;  Location: [facility];  Service: Ophthalmology;  Laterality: Right;   BREAST BIOPSY Right 1980   neg   BREAST EXCISIONAL BIOPSY Right 1980   CATARACT EXTRACTION W/PHACO Left 10/13/2017   Procedure: CATARACT EXTRACTION PHACO AND INTRAOCULAR LENS  PLACEMENT (IOC)  LEFT  DIABETES;  Surgeon: Myrna Adine Anes, MD;  Location: [facility];  Service: Ophthalmology;  Laterality: Left;  Diabetic - insulin  and oral meds   CATARACT EXTRACTION W/PHACO Right 12/01/2017   Procedure: CATARACT EXTRACTION PHACO AND INTRAOCULAR LENS PLACEMENT (IOC)  RIGHT DIABETIC;  Surgeon: Myrna Adine Anes, MD;  Location: [facility];  Service: Ophthalmology;  Laterality: Right;  Diabetic - insulin  and oral   CHOLECYSTECTOMY  1983   Family History  Problem Relation Age of Onset   Diabetes Father    Kidney disease Father    Heart disease Father    Alcohol abuse Mother    Dementia Mother    Diabetes Mother    Diabetes Other    Hemophilia Brother    Uterine cancer Sister    Diabetes Sister    COPD Sister    Breast cancer Maternal Aunt 75   Breast cancer Daughter 43   Stroke Daughter    Diabetes Son    Social History   Socioeconomic History   Marital status: Widowed    Spouse name: Not on file   Number of children: 4   Years of education: Not on file   Highest education level: 12th grade  Occupational History   Not on file  Tobacco Use   Smoking status: Never   Smokeless tobacco: Never  Vaping Use   Vaping status: Never Used  Substance and Sexual  Activity   Alcohol use: No    Alcohol/week: 0.0 standard drinks of alcohol   Drug use: No   Sexual activity: Not Currently  Other Topics Concern   Not on file  Social History Narrative   Widowed. Has four children. Manager of an apartment building. No tobacco, alcohol or other drug use   Social Drivers of Corporate Investment Banker Strain: Low Risk  (10/29/2023)   Overall Financial Resource Strain (CARDIA)    Difficulty of Paying Living Expenses: Not very hard  Food Insecurity: No Food Insecurity (10/29/2023)   Hunger Vital Sign    Worried About Running Out of Food in the Last Year: Never true    Ran Out of Food in the Last Year: Never true  Transportation Needs: No  Transportation Needs (10/29/2023)   PRAPARE - Administrator, Civil Service (Medical): No    Lack of Transportation (Non-Medical): No  Physical Activity: Inactive (10/29/2023)   Exercise Vital Sign    Days of Exercise per Week: 0 days    Minutes of Exercise per Session: Not on file  Stress: No Stress Concern Present (10/29/2023)   [employer] of Occupational Health - Occupational Stress Questionnaire    Feeling of Stress: Only a little  Social Connections: Moderately Integrated (10/29/2023)   Social Connection and Isolation Panel    Frequency of Communication with Friends and Family: More than three times a week    Frequency of Social Gatherings with Friends and Family: More than three times a week    Attends Religious Services: More than 4 times per year    Active Member of [employer] or Organizations: Yes    Attends Banker Meetings: More than 4 times per year    Marital Status: Widowed     Review of Systems  Constitutional:  Negative for unexpected weight change.       Some decreased appetite.   HENT:  Negative for congestion, sinus pressure and sore throat.   Eyes:  Negative for pain and visual disturbance.  Respiratory:  Negative for cough and chest tightness.        Breathing stable.   Cardiovascular:  Negative for chest pain and palpitations.       No increased swelling.   Gastrointestinal:  Negative for vomiting.       Persistent GI issues.   Genitourinary:  Negative for difficulty urinating and dysuria.  Musculoskeletal:  Negative for joint swelling and myalgias.  Skin:  Negative for color change and rash.  Neurological:  Negative for dizziness and headaches.  Hematological:  Negative for adenopathy. Does not bruise/bleed easily.  Psychiatric/Behavioral:  Negative for agitation and dysphoric mood.        Objective:     BP 138/60   Pulse (!) 53   Temp 97.9 F (36.6 C) (Oral)   Ht 5' 0.5 (1.537 m)   Wt 187 lb 4 oz (84.9 kg)   SpO2 96%    BMI 35.97 kg/m  Wt Readings from Last 3 Encounters:  02/20/24 187 lb 4 oz (84.9 kg)  10/30/23 192 lb (87.1 kg)  08/18/23 191 lb (86.6 kg)    Physical Exam Vitals reviewed.  Constitutional:      General: She is not in acute distress.    Appearance: Normal appearance. She is well-developed.  HENT:     Head: Normocephalic and atraumatic.     Right Ear: External ear normal.     Left Ear: External ear normal.  Mouth/Throat:     Pharynx: No oropharyngeal exudate or posterior oropharyngeal erythema.  Eyes:     General: No scleral icterus.       Right eye: No discharge.        Left eye: No discharge.     Conjunctiva/sclera: Conjunctivae normal.  Neck:     Thyroid : No thyromegaly.  Cardiovascular:     Rate and Rhythm: Normal rate and regular rhythm.  Pulmonary:     Effort: No tachypnea, accessory muscle usage or respiratory distress.     Breath sounds: Normal breath sounds. No decreased breath sounds or wheezing.  Chest:  Breasts:    Right: No inverted nipple, mass, nipple discharge or tenderness (no axillary adenopathy).     Left: No inverted nipple, mass, nipple discharge or tenderness (no axilarry adenopathy).  Abdominal:     General: Bowel sounds are normal.     Palpations: Abdomen is soft.     Tenderness: There is no abdominal tenderness.  Musculoskeletal:        General: No tenderness.     Cervical back: Neck supple.     Comments: No increased swelling - improved.   Lymphadenopathy:     Cervical: No cervical adenopathy.  Skin:    Findings: No erythema or rash.  Neurological:     Mental Status: She is alert and oriented to person, place, and time.  Psychiatric:        Mood and Affect: Mood normal.        Behavior: Behavior normal.         Outpatient Encounter Medications as of 02/20/2024  Medication Sig   acetaminophen  (TYLENOL ) 500 MG tablet Take 500-1,000 mg by mouth every 6 (six) hours as needed for mild pain or moderate pain.    albuterol  (VENTOLIN  HFA)  108 (90 Base) MCG/ACT inhaler Inhale 1-2 puffs into the lungs every 6 (six) hours as needed for wheezing or shortness of breath.   amLODipine  (NORVASC ) 10 MG tablet Take 1 tablet (10 mg total) by mouth daily.   apixaban  (ELIQUIS ) 5 MG TABS tablet Take 1 tablet (5 mg total) by mouth 2 (two) times daily.   BD PEN NEEDLE NANO 2ND GEN 32G X 4 MM MISC USE 1 PEN NEEDLE 4 TIMES DAILY   ferrous sulfate 325 (65 FE) MG tablet Take 325 mg by mouth daily with breakfast.   fluticasone  (CUTIVATE ) 0.05 % cream Apply 1 Application topically as needed. Use as directed.   glucose blood (ONETOUCH VERIO) test strip USE AS DIRECTED   Insulin  Glargine (BASAGLAR  KWIKPEN) 100 UNIT/ML Inject 20 Units into the skin daily.   insulin  lispro ([name]  KWIKPEN) 100 UNIT/ML KwikPen Inject 12-16 Units into the skin 3 (three) times daily. Patient assistance   ipratropium (ATROVENT ) 0.06 % nasal spray Place 2 sprays into both nostrils 4 (four) times daily.   [name]  25 MG TABS tablet TAKE ONE TABLET BY MOUTH DAILY   lisinopril  (ZESTRIL ) 20 MG tablet Take 1 tablet (20 mg total) by mouth daily.   MAGnesium -Oxide 400 (240 Mg) MG tablet Take 1 tablet (400 mg total) by mouth daily.   metFORMIN  (GLUCOPHAGE -XR) 500 MG 24 hr tablet Take 2 tablets (1,000 mg total) by mouth 2 (two) times daily.   metoprolol  tartrate (LOPRESSOR ) 25 MG tablet Take 1 tablet (25 mg total) by mouth 2 (two) times daily.   mupirocin  ointment (BACTROBAN ) 2 % Apply 1 application. topically 2 (two) times daily.   Pancrelipase , Lip-Prot-Amyl, ([name]  PO) Take by mouth. Take 36,000-72,000 Units  by mouth See admin instructions. Take 3 capsules by mouth three times daily with meals and take 2 capsule by mouth daily with snacks   pantoprazole  (PROTONIX ) 40 MG tablet Take 1 tablet (40 mg total) by mouth 2 (two) times daily before a meal.   rosuvastatin  (CRESTOR ) 5 MG tablet Take 1 tablet by mouth once daily   triamcinolone  cream (KENALOG ) 0.1 % APPLY  CREAM TOPICALLY TWICE  DAILY   vitamin B-12 (CYANOCOBALAMIN ) 100 MCG tablet Take 100 mcg by mouth daily.   [DISCONTINUED] albuterol  (VENTOLIN  HFA) 108 (90 Base) MCG/ACT inhaler Inhale 2 puffs into the lungs every 4 (four) hours as needed for wheezing or shortness of breath.   [DISCONTINUED] Continuous Glucose Receiver (FREESTYLE LIBRE 3 READER) DEVI USE AS INSTRUCTED WITH LIBRE 3 PLUS SENSOR   [DISCONTINUED] Continuous Glucose Sensor (FREESTYLE LIBRE 2 SENSOR) MISC USE TO CHECK BLOOD SUGAR 4 TIMES DAILY   [DISCONTINUED] Continuous Glucose Sensor (FREESTYLE LIBRE 3 PLUS SENSOR) MISC Change sensor every 15 days.   No facility-administered encounter medications on file as of 02/20/2024.     Lab Results  Component Value Date   WBC 7.7 06/26/2023   HGB 13.3 06/26/2023   HCT 41.1 06/26/2023   PLT 221.0 06/26/2023   GLUCOSE 111 (H) 02/16/2024   CHOL 87 02/16/2024   TRIG 110.0 02/16/2024   HDL 42.00 02/16/2024   LDLDIRECT 74.4 02/21/2012   LDLCALC 23 02/16/2024   ALT 13 02/16/2024   AST 15 02/16/2024   NA 138 02/16/2024   K 4.9 02/16/2024   CL 104 02/16/2024   CREATININE 1.03 02/16/2024   BUN 24 (H) 02/16/2024   CO2 26 02/16/2024   TSH 1.27 06/26/2023   INR 1.2 07/04/2021   HGBA1C 8.4 (H) 02/16/2024   MICROALBUR 1.3 10/27/2023    MM 3D SCREENING MAMMOGRAM BILATERAL BREAST Result Date: 10/25/2022 CLINICAL DATA:  Screening. EXAM: DIGITAL SCREENING BILATERAL MAMMOGRAM WITH TOMOSYNTHESIS AND CAD TECHNIQUE: Bilateral screening digital craniocaudal and mediolateral oblique mammograms were obtained. Bilateral screening digital breast tomosynthesis was performed. The images were evaluated with computer-aided detection. COMPARISON:  Previous exam(s). ACR Breast Density Category b: There are scattered areas of fibroglandular density. FINDINGS: There are no findings suspicious for malignancy. IMPRESSION: No mammographic evidence of malignancy. A result letter of this screening mammogram will be mailed directly to the  patient. RECOMMENDATION: Screening mammogram in one year. (Code:SM-B-01Y) BI-RADS CATEGORY  1: Negative. Electronically Signed   By: Alm Parkins M.D.   On: 10/25/2022 15:31       Assessment & Plan:  Type 2 diabetes mellitus with stage 3 chronic kidney disease, with long-term current use of insulin , unspecified whether stage 3a or 3b CKD (HCC) Assessment & Plan: Discussed diet and exercise. On jardiance , basaglar , humalog , metformin  for her diabetes. Had GI issues with 14mg  dose of rybelsus .  Saw endocrinology 09/23/23 - recommended to continue basaglar  20 units daily and humalog  8-12 units with meals. She has been giving herself 8-12 units humalog  with each meal. Discussed recent blood sugars. Discussed elevated A1c. Agreeable for referral to pharmacy [facility]) for continued diabetic management.   Orders: -     Hemoglobin A1c; Future -     AMB Referral VBCI Care Management  Benign essential hypertension Assessment & Plan: Continue lopressor , metoprolol  and lisinopril . Blood pressures as outlined. Follow pressures. Follow metabolic panel.   Orders: -     Basic metabolic panel with GFR; Future  Hypercholesterolemia Assessment & Plan: Continue crestor .  Low cholesterol diet and exercise.  Follow lipid panel.   Orders: -     Hepatic function panel; Future -     Lipid panel; Future -     CBC with Differential/Platelet; Future -     TSH; Future  Estrogen deficiency -     DG Bone Density; Future  Healthcare maintenance Assessment & Plan: Physical today 02/20/24.  Mammogram 10/24/22- Birads I.  Scheduled for f/u mammogram and bone density 04/05/24. Colonoscopy 2013 - < 1mm polyp in rectum and diverticulosis.  cologuard 08/29/21 - negative.    Obesity, morbid [facility]) Assessment & Plan: With diabetes. Low carb diet and exercise. Follow.    Thrombophilia Assessment & Plan: Afib on eliquis .    Paroxysmal A-fib (HCC) Assessment & Plan: Continue metoprolol . Continue eliquis . Stable.  No change in medication.    Other orders -     Flu vaccine HIGH DOSE PF(Fluzone Trivalent)     Allena Hamilton, MD

## 2024-02-20 NOTE — Telephone Encounter (Signed)
 Good morning,  Pt had an appointment today and stated she has not received her forms for [name] , [name] , Basaglar , or jardiance . Providers portions for all 4 has been completed. Please reach out to pt

## 2024-02-20 NOTE — Assessment & Plan Note (Signed)
 Physical today 02/20/24.  Mammogram 10/24/22- Birads I.  Scheduled for f/u mammogram and bone density 04/05/24. Colonoscopy 2013 - < 1mm polyp in rectum and diverticulosis.  cologuard 08/29/21 - negative.

## 2024-02-20 NOTE — Telephone Encounter (Signed)
 noted

## 2024-02-24 ENCOUNTER — Telehealth: Payer: Self-pay

## 2024-02-24 ENCOUNTER — Other Ambulatory Visit (HOSPITAL_COMMUNITY): Payer: Self-pay

## 2024-02-24 NOTE — Telephone Encounter (Signed)
 Pharmacy Patient Advocate Encounter   Received notification from Onbase that prior authorization forFreeStyle Libre 3 Reader deviceis required/requested.   Insurance verification completed.   The patient is insured through [facility].   Per test claim: PA required; PA submitted to above mentioned insurance via Latent Key/confirmation #/EOC [id number] Status is pending

## 2024-02-25 ENCOUNTER — Telehealth: Payer: Self-pay

## 2024-02-25 DIAGNOSIS — Z794 Long term (current) use of insulin: Secondary | ICD-10-CM

## 2024-02-25 NOTE — Telephone Encounter (Signed)
 Forwarded to [facility]

## 2024-02-25 NOTE — Telephone Encounter (Signed)
 Copied from CRM #[id number]. Topic: Clinical - Prescription Issue >> Feb 25, 2024  4:46 PM Delon T wrote: Reason for CRM: Patient was switched to [facility] 3 plus but no prescription was sent for the reader, please call in order for the reader, she does not have anything to read test strips if she can't get the reader- [phone]

## 2024-02-25 NOTE — Telephone Encounter (Signed)
 Pharmacy Patient Advocate Encounter   Received notification from Latent that prior authorization for FreeStyle Libre 3 Reader device  is required/requested.     Per test claim: Medication is NOT eligible for pharmacy benefits and must be billed through MEDICARE PART D medical insurance. As our team only handles pharmacy related prior auths, medical PA's must be submitted by PHARMACY THAT PARTICIPATED IN MEDICARE PART B. Thank you.

## 2024-02-26 MED ORDER — FREESTYLE LIBRE 3 READER DEVI: 0 refills | Status: AC

## 2024-02-26 NOTE — Addendum Note (Signed)
 Addended by: Inis Borneman on: 02/26/2024 09:55 AM   Modules accepted: Orders

## 2024-02-28 ENCOUNTER — Telehealth: Payer: Self-pay | Admitting: Internal Medicine

## 2024-02-28 DIAGNOSIS — N183 Chronic kidney disease, stage 3 unspecified: Secondary | ICD-10-CM

## 2024-02-28 MED ORDER — FREESTYLE LIBRE 3 PLUS SENSOR MISC: 3 refills | Status: AC

## 2024-02-28 NOTE — Telephone Encounter (Signed)
 Received notification to resend rx for [facility] sensor - with specific directions. Rx sent in with new directions.

## 2024-02-29 ENCOUNTER — Encounter: Payer: Self-pay | Admitting: Internal Medicine

## 2024-02-29 NOTE — Assessment & Plan Note (Signed)
 Discussed diet and exercise. On jardiance , basaglar , humalog , metformin  for her diabetes. Had GI issues with 14mg  dose of rybelsus .  Saw endocrinology 09/23/23 - recommended to continue basaglar  20 units daily and humalog  8-12 units with meals. She has been giving herself 8-12 units humalog  with each meal. Discussed recent blood sugars. Discussed elevated A1c. Agreeable for referral to pharmacy [facility]) for continued diabetic management.

## 2024-02-29 NOTE — Assessment & Plan Note (Signed)
 With diabetes. Low carb diet and exercise. Follow.

## 2024-02-29 NOTE — Assessment & Plan Note (Signed)
 Continue lopressor , metoprolol  and lisinopril . Blood pressures as outlined. Follow pressures. Follow metabolic panel.

## 2024-02-29 NOTE — Assessment & Plan Note (Signed)
Afib on eliquis.

## 2024-02-29 NOTE — Assessment & Plan Note (Signed)
 Continue metoprolol . Continue eliquis . Stable. No change in medication.

## 2024-02-29 NOTE — Assessment & Plan Note (Signed)
Continue crestor.  Low cholesterol diet and exercise.  Follow lipid panel.  

## 2024-03-01 ENCOUNTER — Ambulatory Visit

## 2024-03-01 NOTE — Telephone Encounter (Unsigned)
 Copied from CRM #[id number]. Topic: Clinical - Prescription Issue >> Feb 27, 2024  4:59 PM Dedra B wrote: Reason for CRM: Pt calling regarding Continuous Glucose Receiver (FREESTYLE LIBRE 3 READER) DEVI. Pharmacy is still saying that don't have it. Pls call [facility] and ask for Leita.

## 2024-03-01 NOTE — Telephone Encounter (Signed)
 [facility]ensor [facility] to [facility] on 02/28/24

## 2024-03-02 NOTE — Telephone Encounter (Unsigned)
 Copied from CRM #[id number]. Topic: Clinical - Medication Prior Auth >> Mar 02, 2024  4:32 PM Charolett L wrote: Reason for CRM: pharmacy is needing a prior auth for Continuous Glucose Receiver (FREESTYLE LIBRE 3 READER) DEVI and the forms were resent to the office to be filled out

## 2024-03-08 ENCOUNTER — Telehealth: Payer: Self-pay

## 2024-03-08 ENCOUNTER — Other Ambulatory Visit (HOSPITAL_COMMUNITY): Payer: Self-pay

## 2024-03-08 NOTE — Telephone Encounter (Signed)
 Pharmacy Patient Advocate Encounter   Received notification from Physician's Office that prior authorization for FreeStyle [place] 3 Reader device is required/requested.   Insurance verification completed.   The patient is insured through [facility].   Per test claim: PA required; PA submitted to above mentioned insurance via Latent Key/confirmation #/EOC [id number] Status is pending  [employer] is not able to process this request through ePA, please contact the plan at [phone] or fax in request to [phone].

## 2024-03-08 NOTE — Telephone Encounter (Signed)
 Copied from CRM #[id number]. Topic: Clinical - Medication Prior Auth >> Mar 02, 2024  4:32 PM Charolett L wrote: Reason for CRM: pharmacy is needing a prior auth for Continuous Glucose Receiver (FREESTYLE LIBRE 3 READER) DEVI and the forms were resent to the office to be filled out >> Mar 08, 2024  4:23 PM Sophia H wrote: Checking in on PA for Continuous Glucose Receiver (FREESTYLE LIBRE 3 READER) DEVI  - Request was sent over by pharmacy. Patient states that the insurance advised sometimes if submitted as DME it will be approved but she is not 100%. Patient has not been able to check her blood sugars for 2 weeks.   Pharmacy Patient Advocate Encounter   Received notification from Physician's Office that prior authorization for FreeStyle [place] 3 Reader device is required/requested.   Insurance verification completed.   The patient is insured through [facility].   Per test claim: PA required; PA submitted to above mentioned insurance via Latent Key/confirmation #/EOC [id number] Status is pending   [employer] is not able to process this request through ePA, please contact the plan at [phone] or fax in request to [phone].

## 2024-03-11 ENCOUNTER — Telehealth: Payer: Self-pay

## 2024-03-11 ENCOUNTER — Other Ambulatory Visit: Payer: Self-pay | Admitting: Internal Medicine

## 2024-03-11 ENCOUNTER — Other Ambulatory Visit (HOSPITAL_COMMUNITY): Payer: Self-pay

## 2024-03-11 NOTE — Telephone Encounter (Signed)
 PAP: Application for [name] has been submitted to [name]), via fax

## 2024-03-11 NOTE — Telephone Encounter (Signed)
 PAP: Application for Basaglar  and [name]  has been submitted to [employer], via fax

## 2024-03-11 NOTE — Telephone Encounter (Signed)
 PAP: Application for Bernadine has been submitted to [employer]), via fax

## 2024-03-11 NOTE — Progress Notes (Signed)
 Complex Care Management Note  Care Guide Note 03/11/2024 Name: Beth Tyler MRN: [id number] DOB: 03-18-1946  Beth Tyler is a 78 y.o. year old female who sees Glendia Shad, MD for primary care. I reached out to [facility] [employer] by phone today to offer complex care management services.  Ms. Shepherd was given information about Complex Care Management services today including:   The Complex Care Management services include support from the care team which includes your Nurse Care Manager, Clinical Social Worker, or Pharmacist.  The Complex Care Management team is here to help remove barriers to the health concerns and goals most important to you. Complex Care Management services are voluntary, and the patient may decline or stop services at any time by request to their care team member.   Complex Care Management Consent Status: Patient agreed to services and verbal consent obtained.   Follow up plan:  Telephone appointment with complex care management team member scheduled for:  03/15/24 at 9:00 a.m.   Encounter Outcome:  Patient Scheduled  Dreama [facility]  [facility], [facility] VBCI Assistant Direct Dial : [phone]  Fax: [phone]

## 2024-03-15 ENCOUNTER — Other Ambulatory Visit: Payer: Self-pay | Admitting: Internal Medicine

## 2024-03-15 ENCOUNTER — Other Ambulatory Visit

## 2024-03-15 DIAGNOSIS — E1122 Type 2 diabetes mellitus with diabetic chronic kidney disease: Secondary | ICD-10-CM

## 2024-03-15 MED ORDER — SAFETY LANCETS 28G MISC: 11 refills | Status: AC

## 2024-03-15 MED ORDER — GLUCOSE BLOOD VI STRP: ORAL_STRIP | 12 refills | Status: DC

## 2024-03-15 NOTE — Progress Notes (Signed)
 03/15/2024 Name: Beth Tyler MRN: [id number] DOB: Mar 08, 1946  Subjective  Chief Complaint  Patient presents with   Diabetes    Reason for visit: ?  Beth Tyler is a 78 y.o. female with a history of diabetes (type 2), who presents today for an initial diabetes pharmacotherapy visit. They were referred by their PCP for management of Diabetes.  Pertinent PMH also includes HTN, pAF, thrombophilia, HLD, CKD, obesity.  Known DM Complications: nephropathy   Care Team: Primary Care Provider: Glendia Shad, MD   Date of Last Diabetes Related Visit: with PCP on 02/20/24  Recent Summary of Change: 11/14: TIR 59%, high 30%, low 0%. Doing better with taking her insulin  as scheduled. Sugars have improved recently though A1c 8.4 increased from prior.   Medication Access/Adherence: Prescription drug coverage: Payor: AETNA MEDICARE / Plan: AETNA MEDICARE HMO/PPO / Product Type: *No Product type* / .  - Reports that all medications are relatively affordable. Is switching plans next year which will lower her Eliquis  cost. [employer]etna > [employer]  - Current Patient Assistance:  [name] (basaglar , humalog ) - 2026 enrollment submitted [name] ([name] ) - 2026 enrollment submitted BI Cares ([name] ) - 2026 enrollment submitted   Since Last visit / History of Present Illness: ?  Reports doing well overall. Feels compliance with insulins has been good lately. Has not checked sugars in a couple of weeks due to need for new [facility] reader (as below).   Reported DM Regimen: ?  Metformin  XR 1000 mg twice daily [name]  25 mg daily Basalar 20 units daily [name]  12-15 units AC Checks BG prior to meal. Also considers carb content (if having bread/potatoes, often gives the 15 units)   DM medications tried in the past:?  pioglitazone  (d/c 2020) Rybelsus  (Severe GI upset, abdominal pain/vomiting)  SMBG:  Not linked via LibreView - Has had current reader for several years. Last prescribed 09/21/2018   Medicare will pay for new reader every ~5 years. Has been having issues with [employer] the past several weeks with getting the [facility] reader covered. Has not checked BG in the last 3 weeks because of this.  Reports she has a smart phone (Android) but has been told in the past that the app is not compatible ?   No longer has a glucometer (would like one if possible). 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   Hypo/Hyperglycemia: ?  Symptoms of hypoglycemia since last visit:? no. Notes singe low several weeks back due to eating less than expected for dinner. Lows rare, when they occur are 2/2 light dinner.  Usual treatment = glucose tablets (1-2 if 80). 3-4 tablets if a true low <70. Carries crackers in purse.  Symptoms of hyperglycemia since last visit:? no  Reported Diet: Patient typically eats 3 meals per day.  Not eating as much as she used to. Still loves bread/potatoes though trying to be mindful.  Breakfast: Always (7:30-8 am) - wrap with egg/wrap (sometimes with bacon/cheese) Lunch: Skips (no snacks, nothing at all between 8 and 4pm due to bowel/pancreas - carries crackers with her just in case. Dinner: Largest meal of the day 4 pm  Third meal 7-8 pm: Often similar to 4pm meal, sometimes smaller.  Snacks: None Beverages: most water (sometimes coffee, infrequent. Iced coffee mid-day sometimes), Sprite   Exercise: None. Reports it is difficult to move (uses walker/cane). Sometime walks around [place].   DM Prevention:  Statin: Taking; moderate intensity.?  History of chronic kidney disease? yes History  of albuminuria? yes, last UACR on 10/27/23 = 23.8 mg/g (improved form 40 12/2021) ACE/ARB - Taking lisinopril  20mg ; Urine MA/CR Ratio - <30 mg/g.  Last eye exam: 02/18/23; No retinopathy present - DUE Last foot exam: 08/14/2021 Tobacco Use: Never smoker  Immunizations:? Flu: Up to date (Last: 02/20/2024); Pneumococcal: PCV13 (2011, 2015) PPSV23 (/2019;  received after age 70); Shingrix: No Record - DUE; Covid: Due (Last:07/07/2019); TDap: No Record - DUE  Cardiovascular Risk Reduction History of clinical ASCVD? no History of heart failure? no History of hyperlipidemia? yes Current BMI: 35.9 kg/m2 (Ht 60.5 in, Wt 84.9 kg) Taking statin? yes; moderate intensity (rosuvastatin  5mg ) Taking aspirin? not indicated; Not taking   Taking SGLT-2i? yes Taking GLP- 1 RA? no   Reported HTN Regimen: Lisinopril  20 mg daily Metoprolol  tartrate 25 mg twice daily (PAF) Amlodipine  10 mg daily   Checks BP at home intermittently.  Reports home readings are consistently 120s-130s / 60s-70s mmHg.  Reports HR WNL. Rarely sees a 48-49 at night, though usually >50 mmHg.  Patient denies hypotensive s/sx. No dizziness, lightheadedness.  Patient denies hypertensive symptoms. No headache, chest pain, visual changes.      _______________________________________________  Objective    Vitals:  Wt Readings from Last 3 Encounters:  02/20/24 187 lb 4 oz (84.9 kg)  10/30/23 192 lb (87.1 kg)  08/18/23 191 lb (86.6 kg)   BP Readings from Last 3 Encounters:  02/20/24 138/60  10/30/23 116/70  06/30/23 130/70   Pulse Readings from Last 3 Encounters:  02/20/24 (!) 53  10/30/23 62  06/30/23 71    Labs:?  Lab Results  Component Value Date   HGBA1C 8.4 (H) 02/16/2024   HGBA1C 8.1 (H) 10/27/2023   HGBA1C 7.8 (H) 06/26/2023   GLUCOSE 111 (H) 02/16/2024   MICRALBCREAT 23.8 10/27/2023   CREATININE 1.03 02/16/2024   CREATININE 0.96 10/27/2023   CREATININE 1.16 06/26/2023   GFR 51.76 (L) 02/16/2024   GFR 56.44 (L) 10/27/2023   GFR 45.08 (L) 06/26/2023    Lab Results  Component Value Date   CHOL 87 02/16/2024   LDLCALC 23 02/16/2024   LDLCALC 37 10/27/2023   LDLCALC 49 06/26/2023   LDLDIRECT 74.4 02/21/2012   HDL 42.00 02/16/2024   TRIG 110.0 02/16/2024   TRIG 169.0 (H) 10/27/2023   TRIG 97.0 06/26/2023   ALT 13 02/16/2024   ALT 11 10/27/2023    AST 15 02/16/2024   AST 12 10/27/2023      Chemistry      Component Value Date/Time   NA 138 02/16/2024 0801   K 4.9 02/16/2024 0801   CL 104 02/16/2024 0801   CO2 26 02/16/2024 0801   BUN 24 (H) 02/16/2024 0801   CREATININE 1.03 02/16/2024 0801   CREATININE 1.03 (H) 05/29/2022 1000      Component Value Date/Time   CALCIUM  8.9 02/16/2024 0801   ALKPHOS 66 02/16/2024 0801   AST 15 02/16/2024 0801   ALT 13 02/16/2024 0801   BILITOT 0.7 02/16/2024 0801     The ASCVD Risk score (Arnett DK, et al., 2019) failed to calculate for the following reasons:   The valid total cholesterol range is 130 to 320 mg/dL  Assessment and Plan:   1. Diabetes, type 2: uncontrolled per last A1c of 8.4% (02/16/24), increased from previous, 8.1%. Goal <7% without hypoglycemia. Currently not checking sugars due to issues at the pharmacy with billing of [place] 3 reader (this was an upgrade form Libre 2). No s/sx low sugars.  Current Regimen: metformin  XR 1000mg  BID, Basaglar  20 u/d, [name]  12-15 TIDAC Diet: Eating patterns dictated by BM (on creon ) Exercise: Limited mobility has been a barrier to physical activity.   HCM: Due for Shingrix, tDap (no record)  Continue medications today without changes Called [facility] to review billing for [facility] 3 Reader. Claim successful for $0, filling now. New glucometer + supplies ([facility]). 2026 ins plan preferrs Abbott. Aetna lists 2 Freestyle meter options: Abbott Optium Plus; Abbott Freestyle (Optium should also work in her [place] 3 reader, test claim = $0) Reviewed s/sx/tx hypoglycemia Future Consideration: DPP4i: Possible consideration. Better-tolerated than GLP1 from GI perspective. Likely eligible for Patient Assistance. May support lower prandial insulin  requirement.  GLP1-RA: Avoid for now given previous intolerance Metformin : Continue to closely monitor renal function. If eGFR falls below 45 consistently, dose-reduce to 500 mg BID  SU: Defer given risk of  hypoglycemia on insulin . Could consider morning dose before 1st meal.  TZD: Avoiding due to possible weight gain/increase in fracture risk. DM >10 yrs    2. Medication Access / Medicare 2026 - Switching insurance plans for 2026.  Healthspring - [email or web address] Confirmed ~$295 then $47/month for brand medication thereafter (no price difference for retail vs mailorder)  $0 copay for diabetic monitoring supplies Preferred brands of blood glucose monitors include: Abbott Diabetes Care: FreeStyle Lite, FreeStyle Freedom Lite, FreeStyle Precision  Neo and FreeStyle Libre (CGM) Dexcom: Dexcom G6 (CGM) and Dexcom G7 (CGM)   3. HTN: slightly uncontrolled based on last clinic BP of 138/60 mmHg (11/14), goal <130/80 mmHg, though historically clinic readings have been consistently below goal. Does check BP at home with readings often below range to slightly elevated (120s-130s/60s-70s). Denies lightheadedness, dizziness, SOB, CP, vision changes.  Current Regimen: amlodipine  10 mg/d, lisinopril  20 mg/d, metoprolol  tartrate 25 mg BID Continue medications without changes   4. ASCVD (primary prevention): controlled on last lipid panel with LDL 23 mg/dL, TG 889 mg/dL (88/89/74). LDL goal <70 mg/dL (primary prevention, diabetes).  Key risk factors: diabetes, hypertension (well-controlled), hyperlipidemia (well-controlled), BMI >30 kg/m2, and sedentary lifestyle PREVENT: 10-year risk of CVD = 31.1%; 10-year risk of ASCVD = 15.5%  Current Regimen: rosuvastatin  5 mg daily Continue medications today without changes.    5. Healthcare Maintenance:  Pneumococcal - Current status: Up to date  Shingles - Current status: No record - DUE Influenza - Current status: Up to date for 2024 Tetanus - DUE Due to receive the following vaccines: Shingrix, Covid Booster, and TDap   Follow Up Follow up with clinical pharmacist via phone in 2 weeks for [facility]  review  Patient given direct line for questions regarding medication therapy  Scheduling Notes: Phone visits any time between 8-12 (or 1:15-2:30 pm). Future Appointments  Date Time Provider Department Center  03/19/2024  8:45 AM Gaynel Delon CROME, DPM [facility]  03/30/2024  9:00 AM [facility] PHARMACIST [facility] [address]  04/05/2024  9:00 AM [facility] DEXA [facility]  04/05/2024  9:20 AM [facility] MM DEXA [facility]  06/21/2024  8:30 AM [facility] LAB [facility] [address]  06/23/2024  8:30 AM Glendia Shad, MD [facility] [address]  08/23/2024  1:40 PM [facility] ANNUAL WELLNESS VISIT [facility] [address] Manuelita FABIENE Geronimo, PharmD Clinical Pharmacist [facility] [phone]

## 2024-03-17 ENCOUNTER — Other Ambulatory Visit: Payer: Self-pay

## 2024-03-17 DIAGNOSIS — E1122 Type 2 diabetes mellitus with diabetic chronic kidney disease: Secondary | ICD-10-CM

## 2024-03-17 MED ORDER — ACCU-CHEK GUIDE W/DEVICE KIT: PACK | 0 refills | Status: AC

## 2024-03-17 NOTE — Telephone Encounter (Signed)
 PAP: Patient assistance application for Basaglar  and [name]  has been approved by PAP Companies: [name] from 04/08/2024 to 04/07/2025. Medication should be delivered to PAP Delivery: Home. For further shipping updates, please contact [name] at [phone]. Patient ID is: [id number].

## 2024-03-19 ENCOUNTER — Encounter: Payer: Self-pay | Admitting: Family

## 2024-03-19 ENCOUNTER — Ambulatory Visit: Admitting: Family

## 2024-03-19 ENCOUNTER — Ambulatory Visit: Payer: Self-pay

## 2024-03-19 ENCOUNTER — Ambulatory Visit: Admitting: Podiatry

## 2024-03-19 ENCOUNTER — Encounter: Payer: Self-pay | Admitting: Podiatry

## 2024-03-19 VITALS — BP 150/70 | HR 61 | Temp 97.8°F | Ht 60.5 in | Wt 191.0 lb

## 2024-03-19 DIAGNOSIS — M79675 Pain in left toe(s): Secondary | ICD-10-CM | POA: Diagnosis not present

## 2024-03-19 DIAGNOSIS — N183 Chronic kidney disease, stage 3 unspecified: Secondary | ICD-10-CM | POA: Diagnosis not present

## 2024-03-19 DIAGNOSIS — B351 Tinea unguium: Secondary | ICD-10-CM | POA: Diagnosis not present

## 2024-03-19 DIAGNOSIS — M79674 Pain in right toe(s): Secondary | ICD-10-CM | POA: Diagnosis not present

## 2024-03-19 DIAGNOSIS — Z794 Long term (current) use of insulin: Secondary | ICD-10-CM | POA: Diagnosis not present

## 2024-03-19 DIAGNOSIS — M545 Low back pain, unspecified: Secondary | ICD-10-CM

## 2024-03-19 DIAGNOSIS — E1122 Type 2 diabetes mellitus with diabetic chronic kidney disease: Secondary | ICD-10-CM

## 2024-03-19 MED ORDER — GABAPENTIN 100 MG PO CAPS: 100.0000 mg | ORAL_CAPSULE | Freq: Three times a day (TID) | ORAL | 0 refills | Status: DC

## 2024-03-19 NOTE — Progress Notes (Signed)
 Assessment & Plan:  Right-sided low back pain without sciatica, unspecified chronicity Assessment & Plan: Acute on chronic.  No radicular symptoms at this time. reviewed prior x-ray from 2017.  No history of cancer.  Patient politely declines updating imaging at this time.  She prefers to titrate gabapentin  to start.  Instructions provided on AVS. CrtCl 65ml/min  Orders: -     Gabapentin ; Take 1 capsule (100 mg total) by mouth 3 (three) times daily.  Dispense: 90 capsule; Refill: 0     Return precautions given.   Risks, benefits, and alternatives of the medications and treatment plan prescribed today were discussed, and patient expressed understanding.   Education regarding symptom management and diagnosis given to patient on AVS either electronically or printed.  Return in about 1 month (around 04/19/2024).  Beth Northern, FNP  Subjective:    Patient ID: Beth Tyler, female    DOB: 1945/12/31, 78 y.o.   MRN: [id number]  CC: Beth Tyler is a 78 y.o. female who presents today for an acute visit.    HPI: HPI Discussed the use of AI scribe software for clinical note transcription with the patient, who gave verbal consent to proceed.  History of Present Illness   Beth Tyler is a 78 year old female  who presents with acute back pain.  She experiences acute back pain that began suddenly on Wednesday evening 3 days ago, located in the right low buttocks and radiating down the entire right leg to the knee. The pain is severe and intolerable. Denies associated numbness, groin pain, or saddle anesthesia. There is no recent injury, fall, or specific incident that could have triggered the pain.  She has a history of LBP and has experienced similar pain in the past, though this episode is particularly severe.  She has been using a cane for stability for some time.  For pain management, she takes Tylenol  1000 mg every six hours, but it does not alleviate the pain. She has  also used topical arthritis medications and aspirin without relief. She does not take NSAIDs or opioids.  Her sleep is significantly impacted as she has not been able to sleep in a bed since 2020, following a COVID-19 infection, and instead sleeps in a recliner. The pain is worse at night and when sitting, but walking provides some relief.  Denies trouble urinating, urinary frequency, dysuria, constipation.  Denies history of recurrent UTIs or kidney stones .  Family history is significant for arthritic changes, as her mother and both sisters had similar issues.      History of diabetes, paroxysmal atrial fibrillation, ckd  History of cholecystectomy Extensive drug allergies.  Follow-up PCP 02/20/2024  Lumbar spine 05/2015 There is diffuse osteopenia. There is mild disc space narrowing at L4-5 with anterior endplate osteophytes at this and other levels. There is facet joint hypertrophy at L4-5 and at L5-S1. There is no acute fracture nor dislocation.  Mammogram and DEXA scheduled       Allergies: Hydrocodone, Hydrocodone-acetaminophen , Oxycodone-acetaminophen , Percocet [oxycodone-acetaminophen ], Semaglutide , Demeclocycline, Other, Penicillins, Tetracyclines & related, and Ampicillin Medications Ordered Prior to Encounter[1]  Review of Systems  Constitutional:  Negative for chills and fever.  Respiratory:  Negative for cough.   Cardiovascular:  Negative for chest pain and palpitations.  Gastrointestinal:  Negative for abdominal pain, constipation, nausea and vomiting.  Genitourinary:  Negative for difficulty urinating and dysuria.  Musculoskeletal:  Positive for back pain.  Neurological:  Negative for light-headedness.  Objective:    BP (!) 150/70   Pulse 61   Temp 97.8 F (36.6 C)   Ht 5' 0.5 (1.537 m)   Wt 191 lb (86.6 kg)   SpO2 98%   BMI 36.69 kg/m   BP Readings from Last 3 Encounters:  03/19/24 (!) 150/70  02/20/24 138/60  10/30/23 116/70   Wt  Readings from Last 3 Encounters:  03/19/24 191 lb (86.6 kg)  02/20/24 187 lb 4 oz (84.9 kg)  10/30/23 192 lb (87.1 kg)   Lab Results  Component Value Date   HGBA1C 8.4 (H) 02/16/2024   .gfr  Physical Exam Vitals reviewed.  Constitutional:      Appearance: She is well-developed.  Eyes:     Conjunctiva/sclera: Conjunctivae normal.  Cardiovascular:     Rate and Rhythm: Normal rate and regular rhythm.     Pulses: Normal pulses.     Heart sounds: Normal heart sounds.  Pulmonary:     Effort: Pulmonary effort is normal.     Breath sounds: Normal breath sounds. No wheezing, rhonchi or rales.  Musculoskeletal:       Arms:     Lumbar back: No swelling, edema, spasms, tenderness or bony tenderness. Normal range of motion.     Comments: Full range of motion with flexion, tension, lateral side bends. No bony tenderness. Pain elicited over right SI joint. No pain, numbness, tingling elicited with single leg raise bilaterally.   Right Hip: No limp or waddling gait. Full ROM with flexion and hip rotation in flexion.    No pain of lateral hip with  (flexion-abduction-external rotation) test.   No pain with deep palpation of greater trochanter.     Skin:    General: Skin is warm and dry.  Neurological:     Mental Status: She is alert.     Sensory: No sensory deficit.     Deep Tendon Reflexes:     Reflex Scores:      Patellar reflexes are 2+ on the right side and 2+ on the left side.    Comments: Sensation and strength intact bilateral lower extremities.  Psychiatric:        Speech: Speech normal.        Behavior: Behavior normal.        Thought Content: Thought content normal.           [1]  Current Outpatient Medications on File Prior to Visit  Medication Sig Dispense Refill   acetaminophen  (TYLENOL ) 500 MG tablet Take 500-1,000 mg by mouth every 6 (six) hours as needed for mild pain or moderate pain.      albuterol  (VENTOLIN  HFA) 108 (90 Base) MCG/ACT inhaler Inhale 1-2  puffs into the lungs every 6 (six) hours as needed for wheezing or shortness of breath. 1 g 0   amLODipine  (NORVASC ) 10 MG tablet Take 1 tablet (10 mg total) by mouth daily. 90 tablet 3   apixaban  (ELIQUIS ) 5 MG TABS tablet Take 1 tablet (5 mg total) by mouth 2 (two) times daily. 180 tablet 3   BD PEN NEEDLE NANO 2ND GEN 32G X 4 MM MISC USE 1 PEN NEEDLE 4 TIMES DAILY 200 each 0   Blood Glucose Monitoring Suppl (ACCU-CHEK GUIDE) w/Device KIT Use with strips to check sugar 3 times daily 1 kit 0   Continuous Glucose Receiver (FREESTYLE LIBRE 3 READER) DEVI USE AS INSTRUCTED WITH LIBRE 3 PLUS SENSOR 1 each 0   Continuous Glucose Sensor (FREESTYLE LIBRE 3 PLUS SENSOR) MISC  Change sensor every 15 days. Check blood sugars at least qid and prn. 2 each 3   ferrous sulfate 325 (65 FE) MG tablet Take 325 mg by mouth daily with breakfast.     fluticasone  (CUTIVATE ) 0.05 % cream Apply 1 Application topically as needed. Use as directed. 30 g 0   glucose blood (ACCU-CHEK GUIDE TEST) test strip USE 1 STRIP TO CHECK GLUCOSE 3 TIMES DAILY 100 each 12   Insulin  Glargine (BASAGLAR  KWIKPEN) 100 UNIT/ML Inject 20 Units into the skin daily.     insulin  lispro ([name]  KWIKPEN) 100 UNIT/ML KwikPen Inject 12-16 Units into the skin 3 (three) times daily. Patient assistance 45 mL 2   ipratropium (ATROVENT ) 0.06 % nasal spray Place 2 sprays into both nostrils 4 (four) times daily. 15 mL 12   [name]  25 MG TABS tablet TAKE ONE TABLET BY MOUTH DAILY 90 tablet 2   lisinopril  (ZESTRIL ) 20 MG tablet Take 1 tablet (20 mg total) by mouth daily. 90 tablet 3   MAGnesium -Oxide 400 (240 Mg) MG tablet Take 1 tablet (400 mg total) by mouth daily. 90 tablet 3   metFORMIN  (GLUCOPHAGE -XR) 500 MG 24 hr tablet Take 2 tablets (1,000 mg total) by mouth 2 (two) times daily. 360 tablet 3   metoprolol  tartrate (LOPRESSOR ) 25 MG tablet Take 1 tablet (25 mg total) by mouth 2 (two) times daily. 180 tablet 3   mupirocin  ointment (BACTROBAN ) 2 % Apply  1 application. topically 2 (two) times daily. 22 g 0   Pancrelipase , Lip-Prot-Amyl, ([name]  PO) Take by mouth. Take 36,000-72,000 Units by mouth See admin instructions. Take 3 capsules by mouth three times daily with meals and take 2 capsule by mouth daily with snacks     pantoprazole  (PROTONIX ) 40 MG tablet Take 1 tablet (40 mg total) by mouth 2 (two) times daily before a meal. 180 tablet 3   rosuvastatin  (CRESTOR ) 5 MG tablet Take 1 tablet by mouth once daily 90 tablet 3   Safety Lancets 28G MISC Use to test blood sugar up to 4 times daily (may substitute to any pressure-activated manufacture that is $0 copay). E11.65; Z79.4 100 each 11   triamcinolone  cream (KENALOG ) 0.1 % APPLY  CREAM EXTERNALLY TWICE DAILY 15 g 0   vitamin B-12 (CYANOCOBALAMIN ) 100 MCG tablet Take 100 mcg by mouth daily.     No current facility-administered medications on file prior to visit.

## 2024-03-19 NOTE — Telephone Encounter (Signed)
Evaluated today.

## 2024-03-19 NOTE — Telephone Encounter (Signed)
 FYI Only or Action Required?: Action required by provider: request for appointment.  Patient was last seen in primary care on 02/20/2024 by Glendia Shad, MD.  Called Nurse Triage reporting Back Pain.  Symptoms began yesterday.  Interventions attempted: OTC medications: Tylenol .  Symptoms are: unchanged.Back pain has not changed. Pain down right leg, has had this before.  Triage Disposition: See PCP When Office is Open (Within 3 Days)  Patient/caregiver understands and will follow disposition?: Yes       Copied from CRM [id number]. Topic: Clinical - Red Word Triage >> Mar 19, 2024  7:48 AM Berneda FALCON wrote: Red Word that prompted transfer to Nurse Triage: Patient states that she is having sciatica on her right leg and the tylenol  is not helping her and she would like to know if there is anything PCP could call in for her to help her feel better. She is allergic to a lot of the pain medication. She states the pain is a 9 on a pain scale and considers this extreme pain. Reason for Disposition  [1] Pain radiates into the thigh or further down the leg AND [2] one leg  Answer Assessment - Initial Assessment Questions 1. ONSET: When did the pain begin? (e.g., minutes, hours, days)     2 days 2. LOCATION: Where does it hurt? (upper, mid or lower back)     Low  3. SEVERITY: How bad is the pain?  (e.g., Scale 1-10; mild, moderate, or severe)     9 4. PATTERN: Is the pain constant? (e.g., yes, no; constant, intermittent)      constant 5. RADIATION: Does the pain shoot into your legs or somewhere else?     Right leg 6. CAUSE:  What do you think is causing the back pain?      sciatica 7. BACK OVERUSE:  Any recent lifting of heavy objects, strenuous work or exercise?     no 8. MEDICINES: What have you taken so far for the pain? (e.g., nothing, acetaminophen , NSAIDS)     tylenol  9. NEUROLOGIC SYMPTOMS: Do you have any weakness, numbness, or problems with bowel/bladder  control?     no 10. OTHER SYMPTOMS: Do you have any other symptoms? (e.g., fever, abdomen pain, burning with urination, blood in urine)       no 11. PREGNANCY: Is there any chance you are pregnant? When was your last menstrual period?       no  Protocols used: Back Pain-A-AH

## 2024-03-19 NOTE — Patient Instructions (Addendum)
 Trial of low dose gabapentin 100mg  noon and 100mg  tonight.   If ineffective, Increase to 200mg  at bedtime ( this is 2 tablets)  If pain is still not responding, you may add a 100mg  of gabapentin to the morning and continue 100mg  midday and 100-200mg  gabapentin at bedtime.    Continue tylenol .   Trial of lidocaine  pain patch over the counter  Consider updating imaging and physical therapy at follow up.

## 2024-03-19 NOTE — Assessment & Plan Note (Signed)
 Acute on chronic.  No radicular symptoms at this time. reviewed prior x-ray from 2017.  No history of cancer.  Patient politely declines updating imaging at this time.  She prefers to titrate gabapentin to start.  Instructions provided on AVS. CrtCl 65ml/min

## 2024-03-22 ENCOUNTER — Telehealth: Payer: Self-pay

## 2024-03-22 ENCOUNTER — Other Ambulatory Visit (HOSPITAL_COMMUNITY): Payer: Self-pay

## 2024-03-22 NOTE — Telephone Encounter (Signed)
 PA request has been Cancelled. New Encounter has been or will be created for follow up. For additional info see Pharmacy Prior Auth telephone encounter from 03/22/2024.

## 2024-03-22 NOTE — Telephone Encounter (Signed)
 Pharmacy Patient Advocate Encounter   Received notification from Physician's Office that prior authorization for [facility] 3 Reader is required/requested.   Insurance verification completed.   The patient is insured through [employer].   Per test claim: Refill too soon. PA is not needed at this time. Medication was filled 03/15/2024. Next eligible fill date is 04/07/2024.

## 2024-03-27 ENCOUNTER — Encounter: Payer: Self-pay | Admitting: Podiatry

## 2024-03-27 NOTE — Progress Notes (Signed)
 "  Subjective:  Patient ID: Beth Tyler, female    DOB: May 26, 1945,  MRN: [id number]  MARQUETTE PIONTEK presents to clinic today for at risk foot care. Pt has h/o NIDDM with chronic kidney disease and painful mycotic toenails of both feet that are difficult to trim. Pain interferes with daily activities and wearing enclosed shoe gear comfortably. She has been suffering with sciatica. Chief Complaint  Patient presents with   Diabetes    DFC. A1c 8.4. She will see Dr. Glendia today   New problem(s): None.   PCP is Glendia Shad, MD.  Allergies[1]  Review of Systems: Negative except as noted in the HPI.  Objective:  There were no vitals filed for this visit. Jorgina CLEVIE PROUT is a pleasant 78 y.o. female in NAD. AAO x 3.  Vascular Examination: Capillary refill time immediate b/l. Vascular status intact b/l with palpable pedal pulses. Pedal hair present b/l. No pain with calf compression b/l. Skin temperature gradient WNL b/l. No cyanosis or clubbing b/l. No ischemia or gangrene noted b/l. Lymphedema noted b/l.  Neurological Examination: Protective sensation diminished with 10g monofilament b/l.  Dermatological Examination: Pedal skin with normal turgor, texture and tone b/l.  No open wounds. No interdigital macerations.   Toenails 1-5 b/l thick, discolored, elongated with subungual debris and pain on dorsal palpation.   No hyperkeratotic nor porokeratotic lesions present on today's visit.  Musculoskeletal Examination: Muscle strength 5/5 to all lower extremity muscle groups bilaterally. No pain, crepitus or joint limitation noted with ROM bilateral LE. No gross bony deformities bilaterally. Uses cane for ambulation assistance.   Radiographs: None  Assessment/Plan: 1. Pain due to onychomycosis of toenails of both feet   2. Type 2 diabetes mellitus with stage 3 chronic kidney disease, with long-term current use of insulin , unspecified whether stage 3a or 3b CKD (HCC)   -Consent  given for treatment as described below: -Examined patient. -Continue foot and shoe inspections daily. Monitor blood glucose per PCP/Endocrinologist's recommendations. -Patient to continue soft, supportive shoe gear daily. -Mycotic toenails 1-5 bilaterally were debrided in length and girth with sterile nail nippers and dremel without incident. -Patient/POA to call should there be question/concern in the interim.   Return in about 3 months (around 06/17/2024).  Delon LITTIE Merlin, DPM      [place] LOCATION: [address], [place] [address]                   Office [phone]   [place] LOCATION: [address] [place], [place] [address] Office [phone]     [1]  Allergies Allergen Reactions   Hydrocodone Anaphylaxis   Hydrocodone-Acetaminophen  Anaphylaxis   Oxycodone-Acetaminophen  Anaphylaxis and Other (See Comments)   Percocet [Oxycodone-Acetaminophen ] Anaphylaxis   Semaglutide  Nausea And Vomiting    Severe abdominal upset/pain with Rybelsus  7 mg   Demeclocycline Nausea Only   Other Nausea Only and Other (See Comments)   Penicillins Rash    Did it involve swelling of the face/tongue/throat, SOB, or low BP? No Did it involve sudden or severe rash/hives, skin peeling, or any reaction on the inside of your mouth or nose? No Did you need to seek medical attention at a hospital or doctor's  office? No When did it last happen?       If all above answers are NO, may proceed with cephalosporin use.   Tetracyclines & Related Nausea Only   Ampicillin Itching and Rash   "

## 2024-03-30 ENCOUNTER — Other Ambulatory Visit

## 2024-03-30 DIAGNOSIS — E1122 Type 2 diabetes mellitus with diabetic chronic kidney disease: Secondary | ICD-10-CM

## 2024-03-30 DIAGNOSIS — Z794 Long term (current) use of insulin: Secondary | ICD-10-CM

## 2024-03-30 DIAGNOSIS — N183 Chronic kidney disease, stage 3 unspecified: Secondary | ICD-10-CM

## 2024-03-30 NOTE — Patient Instructions (Signed)
 [name],   It was a pleasure to speak with you today! As we discussed:?   Continue [name]  as you have been taking it.  Start to pay attention to your sugars about 2 hours after finishing a meal. This will tell us  if the [name]  dose was enough. Ideally, we want to see sugars under 180 within 2 hours of finishing a meal. We'll discuss these numbers in 2 weeks.  Increase Basaglar  form 20 to 24 units once daily. Your morning sugars before food/drink tell us  how well your Basaglar  dose is working. Eventually, the goal is to see these numbers between 70 and 130 most days.    Please reach out prior to your next scheduled appointment should you have any questions or concerns.  You may reach me via MyChart Message, or you may leave me a voicemail at [phone] and I will get back to you shortly.   Thank you!   Future Appointments  Date Time Provider Department Center  04/05/2024  9:00 AM [facility] MM DEXA [facility]  04/05/2024  9:20 AM [facility] MM DEXA [facility]  04/13/2024  9:00 AM [facility] PHARMACIST [facility] [address]  05/10/2024  9:30 AM Glendia Shad, MD [facility] [address]  06/18/2024  8:30 AM Gaynel Delon CROME, DPM [facility]  06/21/2024  8:30 AM [facility] [address]  06/23/2024  8:30 AM Glendia Shad, MD [facility] [address]  08/23/2024  1:40 PM [facility] ANNUAL WELLNESS VISIT [facility] [address] FABIENE Geronimo, PharmD Clinical Pharmacist [facility]    [place], [place]  [phone]

## 2024-03-30 NOTE — Progress Notes (Signed)
 "  03/30/2024 Name: Beth Tyler MRN: [id number] DOB: 19-Jan-1946  Subjective  Chief Complaint  Patient presents with   Diabetes    Reason for visit: ?  Beth Tyler is a 78 y.o. female with a history of diabetes (type 2), who presents today for an initial diabetes pharmacotherapy visit. They were referred by their PCP for management of Diabetes.  Pertinent PMH also includes HTN, pAF, thrombophilia, HLD, CKD, obesity.  Known DM Complications: nephropathy   Care Team: Primary Care Provider: Glendia Shad, MD   Date of Last Diabetes Related Visit: with PCP on 02/20/24  Recent Summary of Change: 11/14: TIR 59%, high 30%, low 0%. Doing better with taking her insulin  as scheduled. Sugars have improved recently though A1c 8.4 increased from prior.   Medication Access/Adherence: Prescription drug coverage: Payor: [employer] MEDICARE / Plan: A[employer] MEDICARE HMO/PPO / Product Type: *No Product type* / .  - Reports that all medications are relatively affordable. Is switching plans next year which will lower her Eliquis  cost. [employer] > [employer]  - Current Patient Assistance:  [name] (basaglar , humalog ) - 2026 enrollment submitted [name] ([name] ) - 2026 enrollment submitted [employer] ([name] ) - 2026 enrollment submitted   Since Last visit / History of Present Illness: ?  Reports doing well overall. Feels compliance with insulins has been good lately. Confirms she has received her New Libre 3+ reader and has not had any issues with it since placing her first sensor ~11 days ago. Denies low alarms. Occasional high alarms after evening meal.   Reported DM Regimen: ?  Metformin  XR 1000 mg twice daily [name]  25 mg daily Basalar 20 units daily [name]  12-15 units AC (8-10 breakfast, usually 15 dinner)   DM medications tried in the past:?  pioglitazone  (d/c 2020) Rybelsus  (Severe GI upset, abdominal pain/vomiting)  SMBG: Not linked via LibreView Reports she has a smart phone  (Android) but has been told in the past that the app is not compatible ?   New Accucheck reader and strips obtained from the pharmacy.  Average glucose (Last 7 Days): 191 mg/dL Average glucose by time of day:     12 am - 6 am: 180       6 am - 12 pm: 155      12 pm - 6 pm: 185      6 pm - 12 am: 251   Hypo/Hyperglycemia: ?  Symptoms of hypoglycemia since last visit:? no  Usual treatment = glucose tablets (1-2 if 80). 3-4 tablets if a true low <70. Carries crackers in purse.  Symptoms of hyperglycemia since last visit:? no  Reported Diet: Patient typically eats 3 meals per day.  Not eating as much as she used to. Still loves bread/potatoes though trying to be mindful.  Breakfast: Always (7:30-8 am) - wrap with egg/wrap (sometimes with bacon/cheese) Lunch: Skips (no snacks, nothing at all between 8 and 4pm due to bowel/pancreas - carries crackers with her just in case. Dinner (3:30-4): Largest meal of the day 4 pm  Third meal 7-8 pm: Often similar to 4pm meal, sometimes smaller.  Snacks: None Beverages: most water (sometimes coffee, infrequent. Iced coffee mid-day sometimes), Sprite   Exercise: None. Reports it is difficult to move (uses walker/cane). Sometime walks around [place].   DM Prevention:  Statin: Taking; moderate intensity.?  History of chronic kidney disease? yes History of albuminuria? yes, last UACR on 10/27/23 = 23.8 mg/g (improved form 40 12/2021) ACE/ARB - Taking lisinopril  20mg ; Urine MA/CR Ratio - <  30 mg/g.  Last eye exam: 02/18/23; No retinopathy present - DUE Last foot exam: 08/14/2021 Tobacco Use: Never smoker  Immunizations:? Flu: Up to date (Last: 02/20/2024); Pneumococcal: PCV13 (2011, 2015) PPSV23 (/2019; received after age 75); Shingrix: No Record - DUE; Covid: Due (Last:07/07/2019); TDap: No Record - DUE  Cardiovascular Risk Reduction History of clinical ASCVD? no History of heart failure? no History of hyperlipidemia? yes Current BMI: 35.9 kg/m2  (Ht 60.5 in, Wt 84.9 kg) Taking statin? yes; moderate intensity (rosuvastatin  5mg ) Taking aspirin? not indicated; Not taking   Taking SGLT-2i? yes Taking GLP- 1 RA? no   Reported HTN Regimen: Lisinopril  20 mg daily Metoprolol  tartrate 25 mg twice daily (PAF) Amlodipine  10 mg daily    _______________________________________________  Objective    Vitals:  Wt Readings from Last 3 Encounters:  03/19/24 191 lb (86.6 kg)  02/20/24 187 lb 4 oz (84.9 kg)  10/30/23 192 lb (87.1 kg)   BP Readings from Last 3 Encounters:  03/19/24 (!) 150/70  02/20/24 138/60  10/30/23 116/70   Pulse Readings from Last 3 Encounters:  03/19/24 61  02/20/24 (!) 53  10/30/23 62    Labs:?  Lab Results  Component Value Date   HGBA1C 8.4 (H) 02/16/2024   HGBA1C 8.1 (H) 10/27/2023   HGBA1C 7.8 (H) 06/26/2023   GLUCOSE 111 (H) 02/16/2024   MICRALBCREAT 23.8 10/27/2023   CREATININE 1.03 02/16/2024   CREATININE 0.96 10/27/2023   CREATININE 1.16 06/26/2023   GFR 51.76 (L) 02/16/2024   GFR 56.44 (L) 10/27/2023   GFR 45.08 (L) 06/26/2023    Lab Results  Component Value Date   CHOL 87 02/16/2024   LDLCALC 23 02/16/2024   LDLCALC 37 10/27/2023   LDLCALC 49 06/26/2023   LDLDIRECT 74.4 02/21/2012   HDL 42.00 02/16/2024   TRIG 110.0 02/16/2024   TRIG 169.0 (H) 10/27/2023   TRIG 97.0 06/26/2023   ALT 13 02/16/2024   ALT 11 10/27/2023   AST 15 02/16/2024   AST 12 10/27/2023      Chemistry      Component Value Date/Time   NA 138 02/16/2024 0801   K 4.9 02/16/2024 0801   CL 104 02/16/2024 0801   CO2 26 02/16/2024 0801   BUN 24 (H) 02/16/2024 0801   CREATININE 1.03 02/16/2024 0801   CREATININE 1.03 (H) 05/29/2022 1000      Component Value Date/Time   CALCIUM  8.9 02/16/2024 0801   ALKPHOS 66 02/16/2024 0801   AST 15 02/16/2024 0801   ALT 13 02/16/2024 0801   BILITOT 0.7 02/16/2024 0801     The ASCVD Risk score (Arnett DK, et al., 2019) failed to calculate for the following reasons:    The valid total cholesterol range is 130 to 320 mg/dL  Assessment and Plan:   1. Diabetes, type 2: uncontrolled per last A1c of 8.4% (02/16/24), increased from previous, 8.1%. Goal <7% without hypoglycemia. Doing well over the past 2 weeks with new Libre 3+ reader. No s/sx low sugars. ABG elevated 191 with PP and fasting BG above goal.  Current Regimen: metformin  XR 1000mg  BID, Basaglar  20 u/d, [name]  8-10 breakfast, 15 supper/dinner.  Diet: Eating patterns dictated by BM (on creon ) Exercise: Limited mobility has been a barrier to physical activity.   HCM: Due for Shingrix, tDap (no record)  Increase Basaglar  to 24 units once daily (~20%) Goal over next 2 weeks: Pay attention to 2hPP sugars (higher or lower than 180) Called [facility] to review billing for [facility] 3 Reader.  Claim successful for $0, filling now. Reviewed s/sx/tx hypoglycemia Future Consideration: DPP4i: Possible consideration. Better-tolerated than GLP1 from GI perspective. Likely eligible for Patient Assistance. May support lower prandial insulin  requirement.  GLP1-RA: Avoid for now given previous intolerance Metformin : Continue to closely monitor renal function. If eGFR falls below 45 consistently, dose-reduce to 500 mg BID  SU: Defer given risk of hypoglycemia on insulin . Could consider morning dose before 1st meal.  TZD: Avoiding due to possible weight gain/increase in fracture risk. DM >10 yrs    2. Medication Access / Medicare 2026 - Switching insurance plans for 2026.  [employer]spring - [email or web address] Confirmed ~$295 then $47/month for brand medication thereafter (no price difference for retail vs mailorder)  $0 copay for diabetic monitoring supplies Preferred brands of blood glucose monitors include: Abbott Diabetes Care: FreeStyle Lite, FreeStyle Freedom Lite, FreeStyle Precision  Neo and FreeStyle Libre (CGM) Dexcom: Dexcom G6 (CGM) and Dexcom G7 (CGM)   3.  HTN: slightly uncontrolled based on last clinic BP of 138/60 mmHg (11/14), goal <130/80 mmHg, though historically clinic readings have been consistently below goal. Does check BP at home with readings often below range to slightly elevated (120s-130s/60s-70s). Denies lightheadedness, dizziness, SOB, CP, vision changes.  Current Regimen: amlodipine  10 mg/d, lisinopril  20 mg/d, metoprolol  tartrate 25 mg BID Continue medications without changes   4. ASCVD (primary prevention): controlled on last lipid panel with LDL 23 mg/dL, TG 889 mg/dL (88/89/74). LDL goal <70 mg/dL (primary prevention, diabetes).  Key risk factors: diabetes, hypertension (well-controlled), hyperlipidemia (well-controlled), BMI >30 kg/m2, and sedentary lifestyle PREVENT: 10-year risk of CVD = 31.1%; 10-year risk of ASCVD = 15.5%  Current Regimen: rosuvastatin  5 mg daily Continue medications today without changes.    5. Healthcare Maintenance:  Pneumococcal - Current status: Up to date  Shingles - Current status: No record - DUE Influenza - Current status: Up to date for 2024 Tetanus - DUE Due to receive the following vaccines: Shingrix, Covid Booster, and TDap   Follow Up Follow up with clinical pharmacist via phone in 2 weeks for [place] review/PPBG review.  Patient given direct line for questions regarding medication therapy  Scheduling Notes: Phone visits any time between 8-12 (or 1:15-2:30 pm). Future Appointments  Date Time Provider Department Center  04/05/2024  9:00 AM [facility] MM DEXA [facility]  04/05/2024  9:20 AM [facility] MM DEXA [facility]  04/13/2024  9:00 AM [facility] PHARMACIST [facility] [address]  05/10/2024  9:30 AM Glendia Shad, MD [facility] [address]  06/18/2024  8:30 AM Gaynel Delon CROME, DPM [facility]  06/21/2024  8:30 AM [facility] [address]  06/23/2024  8:30 AM Glendia Shad, MD [facility] [address]  08/23/2024  1:40 PM [facility] ANNUAL WELLNESS VISIT  [facility] [address] FABIENE Geronimo, PharmD Clinical Pharmacist [facility] [phone] "

## 2024-04-05 ENCOUNTER — Other Ambulatory Visit: Payer: Self-pay | Admitting: Pharmacist

## 2024-04-05 ENCOUNTER — Ambulatory Visit: Admission: RE | Admit: 2024-04-05 | Source: Ambulatory Visit

## 2024-04-05 NOTE — Progress Notes (Signed)
 Chart Review Reason: Patient assistance application ([name] )  Summary: Program requesting eRx for [name]  to [name] PAP for 2026 enrollment.  Denial of application prescription as it was submitted as incoming fax. Program requesting original copy. No photocopies permitted.   Last GI visit: [place] with Jane Delmar Pike, NP - 07/25/2021  Well-controlled on [name] , no changes [name]  36,000-114,000- 180,000 unit DR capsule TAKE 3 CAPSULES BY MOUTH THREE TIMES DAILY WITH MEALS AND 2 WITH [facility]   Provider page uploaded to PCP eFax folder for review/signature.  After PCP signature, CMA may then fax directly to [name] PAP ([phone]) AND scan to chart

## 2024-04-05 NOTE — Progress Notes (Signed)
 Received placed in folder for signature

## 2024-04-05 NOTE — Telephone Encounter (Signed)
 Letter in media from [place] requesting a valid Rx for [name]  to be sent in.

## 2024-04-06 ENCOUNTER — Ambulatory Visit
Admission: RE | Admit: 2024-04-06 | Discharge: 2024-04-06 | Disposition: A | Discharge status: Home / Self Care | Source: Ambulatory Visit | Attending: Internal Medicine | Admitting: Internal Medicine

## 2024-04-06 ENCOUNTER — Ambulatory Visit: Admission: RE | Admit: 2024-04-06 | Discharge: 2024-04-06 | Attending: Internal Medicine | Admitting: Internal Medicine

## 2024-04-06 DIAGNOSIS — E2839 Other primary ovarian failure: Secondary | ICD-10-CM | POA: Diagnosis present

## 2024-04-06 DIAGNOSIS — Z1231 Encounter for screening mammogram for malignant neoplasm of breast: Secondary | ICD-10-CM | POA: Insufficient documentation

## 2024-04-09 ENCOUNTER — Ambulatory Visit: Payer: Self-pay | Admitting: Internal Medicine

## 2024-04-09 LAB — OPHTHALMOLOGY REPORT-SCANNED

## 2024-04-13 ENCOUNTER — Other Ambulatory Visit: Admitting: Pharmacist

## 2024-04-13 DIAGNOSIS — E1122 Type 2 diabetes mellitus with diabetic chronic kidney disease: Secondary | ICD-10-CM

## 2024-04-13 NOTE — Progress Notes (Unsigned)
 "  04/15/2024 Name: Beth Tyler MRN: [id number] DOB: 1945/07/06  Subjective  Chief Complaint  Patient presents with   Diabetes    Reason for visit: ?  Beth Tyler is a 79 y.o. female with a history of diabetes (type 2), who presents today for a follow up diabetes pharmacotherapy visit. They were referred by their PCP for management of Diabetes.  Pertinent PMH also includes HTN, PAF, thrombophilia, HLD, CKD, obesity.  Known DM Complications: nephropathy   Care Team: Primary Care Provider: Glendia Shad, MD   Date of Last Diabetes Related Visit: with PCP on 02/20/24  Recent Summary of Change: 12/23: ABG 191 mg/dL, all fasting >>869 mg/dL. ?? Basaglar  24u/d (~20%) 11/14: TIR 59%, high 30%, low 0%. Doing better with taking her insulin  as scheduled. Sugars have improved recently though A1c 8.4 increased from prior.   Medication Access/Adherence: Prescription drug coverage: Payor: A[employer] MEDICARE / Plan: AETNA MEDICARE HMO/PPO / Product Type: *No Product type* / .  - Reports that all medications are relatively affordable. Is switching plans next year which will lower her Eliquis  cost. [employer] > [employer]  - Current Patient Assistance:  [name] (basaglar , humalog ) - 2026 enrollment submitted [name] ([name] ) - 2026 enrollment submitted [employer] ([name] ) - 2026 enrollment submitted   Since Last visit / History of Present Illness: ?  Reports doing well overall. Notes placing new Libre sensor  Most recently, dealing with radiating hip pain which she feels affects her sugars. Placed new sensor and it reported is was showing HIGH. Received replacement sensor from [place].  Notes she received denial letter from [facility].   Reported DM Regimen: ?  Metformin  XR 1000 mg twice daily [name]  25 mg daily Basalar  daily (taking 22 units daily, did not increase to 24) [name]  12-15 units AC (8-10 breakfast, usually 15 dinner)   DM medications tried in the past:?  pioglitazone  (d/c  2020) Rybelsus  (Severe GI upset, abdominal pain/vomiting)  SMBG: Not linked via LibreView Reports she has a smart phone (Android) but has been told in the past that the app is not compatible ?  New Accucheck reader and strips obtained from the pharmacy Dec 2025.  Average glucose (Last 7 Days): 189 mg/dL Average glucose by time of day:     12 am - 6 am: 250       6 am - 12 pm: 201      12 pm - 6 pm: 140      6 pm - 12 am: 173   Hypo/Hyperglycemia: ?  Symptoms of hypoglycemia since last visit:? no  Usual treatment = glucose tablets (1-2 if 80). 3-4 tablets if a true low <70. Carries crackers in purse.  Symptoms of hyperglycemia since last visit:? no  Reported Diet: Patient typically eats 3 meals per day.  Not eating as much as she used to. Still loves bread/potatoes though trying to be mindful.  Breakfast: Always (7:30-8 am) - wrap with egg/wrap (sometimes with bacon/cheese) Lunch: Skips (no snacks, nothing at all between 8 and 4pm due to bowel/pancreas - carries crackers with her just in case. Dinner (3:30-4): Largest meal of the day 4 pm  Third meal 7-8 pm: Often similar to 4pm meal, sometimes smaller.  Snacks: None Beverages: most water (sometimes coffee, infrequent. Iced coffee mid-day sometimes), Sprite   Exercise: None. Reports it is difficult to move (uses walker/cane). Sometime walks around [place].   DM Prevention:  Statin: Taking; moderate intensity.?  History of chronic kidney disease? yes History  of albuminuria? yes, last UACR on 10/27/23 = 23.8 mg/g (improved form 40 12/2021) ACE/ARB - Taking lisinopril  20mg ; Urine MA/CR Ratio - <30 mg/g.  Last eye exam: 02/18/23; No retinopathy present - DUE Last foot exam: 08/14/2021 Tobacco Use: Never smoker  Immunizations:? Flu: Up to date (Last: 02/20/2024); Pneumococcal: PCV13 (2011, 2015) PPSV23 (/2019; received after age 50); Shingrix: No Record - DUE; Covid: Due (Last:07/07/2019); TDap: No Record - DUE  Cardiovascular Risk  Reduction History of clinical ASCVD? no History of heart failure? no History of hyperlipidemia? yes Current BMI: 35.9 kg/m2 (Ht 60.5 in, Wt 84.9 kg) Taking statin? yes; moderate intensity (rosuvastatin  5mg ) Taking aspirin? not indicated; Not taking   Taking SGLT-2i? yes Taking GLP- 1 RA? no   Reported HTN Regimen: Lisinopril  20 mg daily Metoprolol  tartrate 25 mg twice daily (PAF) Amlodipine  10 mg daily    _______________________________________________  Objective    Vitals:  Wt Readings from Last 3 Encounters:  03/19/24 191 lb (86.6 kg)  02/20/24 187 lb 4 oz (84.9 kg)  10/30/23 192 lb (87.1 kg)   BP Readings from Last 3 Encounters:  03/19/24 (!) 150/70  02/20/24 138/60  10/30/23 116/70   Pulse Readings from Last 3 Encounters:  03/19/24 61  02/20/24 (!) 53  10/30/23 62    Labs:?  Lab Results  Component Value Date   HGBA1C 8.4 (H) 02/16/2024   HGBA1C 8.1 (H) 10/27/2023   HGBA1C 7.8 (H) 06/26/2023   GLUCOSE 111 (H) 02/16/2024   MICRALBCREAT 23.8 10/27/2023   CREATININE 1.03 02/16/2024   CREATININE 0.96 10/27/2023   CREATININE 1.16 06/26/2023   GFR 51.76 (L) 02/16/2024   GFR 56.44 (L) 10/27/2023   GFR 45.08 (L) 06/26/2023    Lab Results  Component Value Date   CHOL 87 02/16/2024   LDLCALC 23 02/16/2024   LDLCALC 37 10/27/2023   LDLCALC 49 06/26/2023   LDLDIRECT 74.4 02/21/2012   HDL 42.00 02/16/2024   TRIG 110.0 02/16/2024   TRIG 169.0 (H) 10/27/2023   TRIG 97.0 06/26/2023   ALT 13 02/16/2024   ALT 11 10/27/2023   AST 15 02/16/2024   AST 12 10/27/2023      Chemistry      Component Value Date/Time   NA 138 02/16/2024 0801   K 4.9 02/16/2024 0801   CL 104 02/16/2024 0801   CO2 26 02/16/2024 0801   BUN 24 (H) 02/16/2024 0801   CREATININE 1.03 02/16/2024 0801   CREATININE 1.03 (H) 05/29/2022 1000      Component Value Date/Time   CALCIUM  8.9 02/16/2024 0801   ALKPHOS 66 02/16/2024 0801   AST 15 02/16/2024 0801   ALT 13 02/16/2024 0801    BILITOT 0.7 02/16/2024 0801     The ASCVD Risk score (Arnett DK, et al., 2019) failed to calculate for the following reasons:   The valid total cholesterol range is 130 to 320 mg/dL  Assessment and Plan:   1. Diabetes, type 2: uncontrolled per last A1c of 8.4% (02/16/24), increased from previous, 8.1%. Goal <7% without hypoglycemia. Doing well over the past 2 weeks. No s/sx low sugars. ABG elevated 189, slightly decreased from 2 weeks ago though did not increase insulin  to recommenced dose.   Current Regimen: metformin  XR 1000mg  BID, Basaglar  22 u/d, [name]  8-10 breakfast, 15 supper/dinner.  Diet: Eating patterns dictated by BM (on creon ) Exercise: Limited mobility has been a barrier to physical activity.   HCM: Due for Shingrix, tDap (no record)  Increase Basaglar  to 24 units once  daily per patient preference. Likely will require further titration given current fastings ~200 mg.dL.  Goal over next 2 weeks: Pay attention to 2hPP sugars (higher or lower than 180) Reviewed s/sx/tx hypoglycemia SGLT2i PAP initiated *** Future Consideration: DPP4i: Possible consideration. Better-tolerated than GLP1 from GI perspective. Likely eligible for Patient Assistance. May support lower prandial insulin  requirement.  GLP1-RA: Avoid for now given previous intolerance Metformin : Continue to closely monitor renal function. If eGFR falls below 45 consistently, dose-reduce to 500 mg BID  SU: Defer given risk of hypoglycemia on insulin . Could consider morning dose before 1st meal.  TZD: Avoiding due to possible weight gain/increase in fracture risk. DM >10 yrs    2. Medication Access / Medicare 2026 - Switching insurance plans for 2026.  [employer] - [email or web address] Confirmed ~$295 then $47/month for brand medication thereafter (no price difference for retail vs mailorder)  $0 copay for diabetic monitoring supplies Preferred brands of blood glucose  monitors include: Abbott Diabetes Care: FreeStyle Lite, FreeStyle Freedom Lite, FreeStyle Precision  Neo and FreeStyle Libre (CGM) Dexcom: Dexcom G6 (CGM) and Dexcom G7 (CGM)   3. HTN: slightly uncontrolled based on last clinic BP of 138/60 mmHg (11/14), goal <130/80 mmHg, though historically clinic readings have been consistently below goal. Does check BP at home with readings often below range to slightly elevated (120s-130s/60s-70s). Denies lightheadedness, dizziness, SOB, CP, vision changes.  Current Regimen: amlodipine  10 mg/d, lisinopril  20 mg/d, metoprolol  tartrate 25 mg BID Continue medications without changes   4. ASCVD (primary prevention): controlled on last lipid panel with LDL 23 mg/dL, TG 889 mg/dL (88/89/74). LDL goal <70 mg/dL (primary prevention, diabetes).  Key risk factors: diabetes, hypertension (well-controlled), hyperlipidemia (well-controlled), BMI >30 kg/m2, and sedentary lifestyle PREVENT: 10-year risk of CVD = 31.1%; 10-year risk of ASCVD = 15.5%  Current Regimen: rosuvastatin  5 mg daily Continue medications today without changes.    5. Healthcare Maintenance:  Pneumococcal - Current status: Up to date  Shingles - Current status: No record - DUE Influenza - Current status: Up to date for 2024 Tetanus - DUE Due to receive the following vaccines: Shingrix, Covid Booster, and TDap   Follow Up Follow up with clinical pharmacist via phone in 2 weeks for [place] review/PPBG review.  Patient given direct line for questions regarding medication therapy  Scheduling Notes: Phone visits any time between 8-12 (or 1:15-2:30 pm). Future Appointments  Date Time Provider Department Center  05/10/2024  9:30 AM Glendia Shad, MD [facility] [address]  06/18/2024  8:30 AM Gaynel Delon CROME, DPM [facility]  06/21/2024  8:30 AM [facility] [address]  06/23/2024  8:30 AM Glendia Shad, MD [facility] [address]  08/23/2024  1:40 PM [facility] ANNUAL  WELLNESS VISIT [facility] [address] FABIENE Geronimo, PharmD Clinical Pharmacist [facility] [phone] "

## 2024-04-22 ENCOUNTER — Other Ambulatory Visit: Payer: Self-pay | Admitting: Family

## 2024-04-22 DIAGNOSIS — M545 Low back pain, unspecified: Secondary | ICD-10-CM

## 2024-04-26 NOTE — Telephone Encounter (Signed)
 Faxed Rx for [name]  to [name] as requested.

## 2024-04-26 NOTE — Progress Notes (Unsigned)
 ***  Assess: *** 2hPP BG >> if uncontrolled then increase meal time by 3-5 units *** FBG? >>FBG still elevated then increase by 10-20% ***metformin : GFR 58 >> cont.     04/26/2024 Name: Beth Tyler MRN: [id number] DOB: February 03, 1946  Subjective  No chief complaint on file.   Reason for visit: ?  Beth Tyler is a 79 y.o. female with a history of diabetes (type 2), who presents today for a follow up diabetes pharmacotherapy visit. They were referred by their PCP for management of Diabetes.  Pertinent PMH also includes HTN, PAF, thrombophilia, HLD, CKD, obesity.  Known DM Complications: nephropathy   Care Team: Primary Care Provider: Glendia Shad, MD   Date of Last Diabetes Related Visit: with Pharmacist on 04/13/24  Recent Summary of Change: A1c had increased to 8.4%. Average BG of 189 and fasting BG of ~200. Pt did not increase insulin  to recommended dose and has not tracked PPBG as previously discussed. Basaglar  increased to 24 units/d. Discussed that prandial insulin  will likely need titration. Counseled pt to monitor 2hPP sugars.  [employer] denial letter discussed.    Reported DM Regimen: ?  Metformin  XR 1000 mg twice daily [name]  25 mg daily Basalar 24 units daily (taking 22 units daily, did not increase to 24) [name]  12-15 units AC (8-10 breakfast, usually 15 dinner)   DM medications tried in the past:?  pioglitazone  (d/c 2020) Rybelsus  (Severe GI upset, abdominal pain/vomiting)  SMBG: Accucheck  ?   Average glucose (Last 7 Days):*** Average glucose by time of day:     12 am - 6 am: ***       6 am - 12 pm: ***      12 pm - 6 pm: ***      6 pm - 12 am: ***   Hypo/Hyperglycemia: ?  Symptoms of hypoglycemia since last visit:? {Blank multiple:19197::yes,no,n/a,***}  Symptoms of hyperglycemia since last visit:? {Blank multiple:19197::yes,no,n/a,***} - {symptoms; hyperglycemia:17903}  Reported Diet: Patient typically eats 3 meals per day.   Not eating as much as she used to. Still loves bread/potatoes though trying to be mindful.  Breakfast: Always (7:30-8 am) - wrap with egg/wrap (sometimes with bacon/cheese) Lunch: Skips (no snacks, nothing at all between 8 and 4pm due to bowel/pancreas - carries crackers with her just in case. Dinner (3:30-4): Largest meal of the day 4 pm  Third meal 7-8 pm: Often similar to 4pm meal, sometimes smaller.  Snacks: None Beverages: most water (sometimes coffee, infrequent. Iced coffee mid-day sometimes), Sprite   Exercise: None. Reports it is difficult to move (uses walker/cane). Sometime walks around [place].   DM Prevention:  Statin: Taking; moderate intensity.?  History of chronic kidney disease? yes History of albuminuria? yes, last UACR on 10/27/23 = 23.8 mg/g ACE/ARB - Taking lisinopril ; Urine MA/CR Ratio - <30 mg/g.  Last eye exam: 04/09/24; No retinopathy present Last foot exam: 08/14/2021 Tobacco Use: Never  Immunizations:?  Flu: Up to date (Last: 02/20/2024); Pneumococcal: PCV13 (2011, 2015) PPSV23 (/2019; received after age 30); Shingrix: No Record - DUE; Covid: Due (Last:07/07/2019); TDap: No Record - DUE   Cardiovascular Risk Reduction History of clinical ASCVD? no History of heart failure? no History of hyperlipidemia? yes Current BMI: 36.7 kg/m2 (Ht 60 in, Wt 86.6 kg) Taking statin? yes; moderate intensity (rosuvastatin  5 mg) Taking aspirin? not indicated; Not taking   Taking SGLT-2i? yes Taking GLP- 1 RA? no      _______________________________________________  Objective      Physical  Examination:  Vitals:  Wt Readings from Last 3 Encounters:  03/19/24 86.6 kg (191 lb)  02/20/24 84.9 kg (187 lb 4 oz)  10/30/23 87.1 kg (192 lb)   BP Readings from Last 3 Encounters:  03/19/24 (!) 150/70  02/20/24 138/60  10/30/23 116/70   Pulse Readings from Last 3 Encounters:  03/19/24 61  02/20/24 (!) 53  10/30/23 62     Labs:?  Lab Results  Component Value Date    HGBA1C 8.4 (H) 02/16/2024   HGBA1C 8.1 (H) 10/27/2023   HGBA1C 7.8 (H) 06/26/2023   GLUCOSE 111 (H) 02/16/2024   MICRALBCREAT 23.8 10/27/2023   CREATININE 1.03 02/16/2024   CREATININE 0.96 10/27/2023   CREATININE 1.16 06/26/2023   GFR 51.76 (L) 02/16/2024   GFR 56.44 (L) 10/27/2023   GFR 45.08 (L) 06/26/2023    Lab Results  Component Value Date   CHOL 87 02/16/2024   LDLCALC 23 02/16/2024   LDLCALC 37 10/27/2023   LDLCALC 49 06/26/2023   LDLDIRECT 74.4 02/21/2012   HDL 42.00 02/16/2024   TRIG 110.0 02/16/2024   TRIG 169.0 (H) 10/27/2023   TRIG 97.0 06/26/2023   ALT 13 02/16/2024   ALT 11 10/27/2023   AST 15 02/16/2024   AST 12 10/27/2023      Chemistry      Component Value Date/Time   NA 138 02/16/2024 0801   K 4.9 02/16/2024 0801   CL 104 02/16/2024 0801   CO2 26 02/16/2024 0801   BUN 24 (H) 02/16/2024 0801   CREATININE 1.03 02/16/2024 0801   CREATININE 1.03 (H) 05/29/2022 1000      Component Value Date/Time   CALCIUM  8.9 02/16/2024 0801   ALKPHOS 66 02/16/2024 0801   AST 15 02/16/2024 0801   ALT 13 02/16/2024 0801   BILITOT 0.7 02/16/2024 0801       Assessment and Plan:   1. Diabetes, type 2: uncontrolled per last A1c of 8.4% (02/16/24), increased from previous, 8.1%. Goal 7-7.5%% without hypoglycemia.  CGM data shows   Current Regimen: metformin  XR 1000mg  BID, Basaglar  *** u/d, [name]  8-10 breakfast, 15 supper/dinner. Diet: Eating patterns dictated by BM (on creon )  Exercise: Limited mobility has been a barrier to physical activity.     HCM: Due for Shingrikx, Tdap Continue medications today without changes.  ***  Reviewed s/sx/tx hypoglycemia Future Consideration: DPP4i: Possible consideration. Better-tolerated than GLP1 from GI perspective. Likely eligible for Patient Assistance. May support lower prandial insulin  requirement.  GLP1-RA: Avoid for now given previous intolerance Metformin : Continue to closely monitor renal function. If eGFR falls  below 45 consistently, dose-reduce to 500 mg BID  SU: Defer given risk of hypoglycemia on insulin . Could consider morning dose before 1st meal.  TZD: Avoiding due to possible weight gain/increase in fracture risk. DM >10 yrs  2. Medication Access ***    3. HTN: Slightly uncontrolled based on last clinic BP of 138/60 mmHg (11/14), goal <130/80 mmHg, though historically clinic readings have been consistently below goal. Does check BP at home with readings often below range to slightly elevated (120s-130s/60s-70s). Denies lightheadedness, dizziness, SOB, CP, vision changes.  Current Regimen: amlodipine  10 mg/d, lisinopril  20 mg/d, metoprolol  tartrate 25 mg BID Continue medications without changess.  Future Consideration: *** BP Readings from Last 3 Encounters:  03/19/24 (!) 150/70  02/20/24 138/60  10/30/23 116/70     3. ASCVD (primary prevention): controlled on last lipid panel with LDL 23 mg/dL, TG 889 mg/dL (88/89/74). LDL goal <70 mg/dL (primary prevention, diabetes).  Key risk factors: diabetes, hypertension (well-controlled), hyperlipidemia (well-controlled), BMI >30 kg/m2, and sedentary lifestyle PREVENT: 10-year risk of CVD = 31.1%; 10-year risk of ASCVD = 15.5%  Current Regimen: rosuvastatin  5 mg daily Continue medications today without changes.    4. Healthcare Maintenance:  Pneumococcal - Current status: Up to date  Shingles - Current status: No record - DUE Influenza - Current status: Up to date for 2024 Tetanus - DUE Due to receive the following vaccines: Shingrix, Covid Booster, and TDap   Follow Up Follow up with clinical pharmacist via *** in *** weeks Patient given direct line for questions regarding medication therapy  Future Appointments  Date Time Provider Department Center  04/27/2024 10:00 AM [facility] PHARMACIST [facility] [address]  05/10/2024  9:30 AM Glendia Shad, MD [facility] [address]  06/18/2024  8:30 AM Gaynel Delon CROME, DPM [facility]  06/21/2024  8:30 AM [facility] LAB [facility] [address]  06/23/2024  8:30 AM Glendia Shad, MD [facility] [address]  08/23/2024  1:40 PM [facility] ANNUAL WELLNESS VISIT [facility] [address] DOROTHA Bridgette, PharmD PGY1 Health-System Pharmacy Administration and Leadership Resident [facility]  04/26/2024 3:14 PM

## 2024-04-27 ENCOUNTER — Other Ambulatory Visit: Payer: Self-pay

## 2024-04-27 DIAGNOSIS — Z794 Long term (current) use of insulin: Secondary | ICD-10-CM

## 2024-04-28 NOTE — Telephone Encounter (Signed)
 PAP: Patient assistance application for [name]  has been approved by PAP Companies: [employer] from 04/28/2024 to 04/07/2025. Medication should be delivered to PAP Delivery: Home. For further shipping updates, please contact [name] at [phone]. Patient ID is: approval letter in media.

## 2024-04-29 NOTE — Telephone Encounter (Signed)
 PAP: Patient assistance application for [name]  has been approved by PAP Companies: [employer] from 04/29/2024 to 04/07/2025. Medication should be delivered to PAP Delivery: Home. For further shipping updates, please contact [employer] ([employer]) at [phone]. Patient ID is: [id number].

## 2024-05-01 ENCOUNTER — Other Ambulatory Visit: Payer: Self-pay | Admitting: Internal Medicine

## 2024-05-03 ENCOUNTER — Encounter: Payer: Self-pay | Admitting: Pharmacist

## 2024-05-03 NOTE — Progress Notes (Unsigned)
 Assessment: contacted Beth Tyler about faulty reader?   Plan: ABG elevated: Inc. [place] inc 10-20% to 28 or 30 Noon still elevated: Inc. To 19 units humalog  Notes: FU:     05/03/2024 Name: Beth Tyler MRN: [id number] DOB: 25-May-1945  Subjective  No chief complaint on file.   Reason for visit: ?  Beth Tyler is a 79 y.o. female with a history of diabetes (type 2), who presents today for a follow up diabetes pharmacotherapy visit. They were referred by their PCP for management of Diabetes.  Pertinent PMH also includes HTN, PAF, thrombophilia, HLD, CKD, obesity.  Known DM Complications: nephropathy   Care Team: Primary Care Provider: Glendia Shad, MD   Date of Last Diabetes Related Visit: with Pharmacist on 04/27/24  Recent Summary of Change: ABG increased to 194 with higher afternoon readings (Avg 202) and concerns for a faulty reader. Conservatively increased basaglar  to 26 units/d and  3:30 PM meal lispro to 17 units.  Medication Access/Adherence: Prescription drug coverage: New insurance 2026 = [employer]  - Current Patient Assistance:  [name] (basaglar , humalog ) - 2026 Approved [name] ([name] ) - 2026 enrollment submitted [employer] ([name] ) - 2026 Approved  Since Last Visit: ***   Reported DM Regimen: ?  Metformin  XR 1000 mg twice daily (usually 3 tablets at night, sometimes 4 if BG really high) [name]  25 mg daily Basalar 26 units daily [name]  15 units AC breakfast, dinner and 17 units AC lunch   DM medications tried in the past:?  pioglitazone  (d/c 2020) Rybelsus  (Severe GI upset, abdominal pain/vomiting)  CGM: Libre 3+ (reader) - Reports smart phone is incompatible  Average glucose (Last 7 Days):*** Average glucose by time of day:     12 am - 6 am: ***        6 am - 12 pm: ***      12 pm - 6 pm: ***      6 pm - 12 am: ***  Of note, pt believes there may be something faulty with her reader though unable to discern what the issue is. Is reading  sugars and Beth Tyler report is present for last 14 days. She notes some issue when trying to review glucose history/reports? Has not contacted [place].    Hypo/Hyperglycemia: ?  Symptoms of hypoglycemia since last visit:? ***  Symptoms of hyperglycemia since last visit:? ***   Reported Diet: Patient typically eats 3 meals per day.  Not eating as much as she used to. Still loves bread/potatoes though trying to be mindful.  Breakfast: Always (7:30-8 am) - wrap with egg/wrap (sometimes with bacon/cheese) Lunch: Skips (no snacks, nothing at all between 8 and 4pm due to bowel/pancreas - carries crackers with her just in case. Dinner (3:30-4): Largest meal of the day 4 pm  Third meal 7-8 pm: Often similar to 4pm meal, sometimes smaller.  Snacks: None Beverages: most water (sometimes coffee, infrequent Iced coffee mid-day), Sprite   Exercise: None. Reports it is difficult to move (uses walker/cane). Sometime walks around [place].   DM Prevention:  Statin: Taking; moderate intensity.?  History of chronic kidney disease? yes History of albuminuria? yes, last UACR on 10/27/23 = 23.8 mg/g ACE/ARB - Taking lisinopril ; Urine MA/CR Ratio - <30 mg/g.  Last eye exam: 04/09/24; No retinopathy present Last foot exam: 08/14/2021 Tobacco Use: Never  Immunizations:?  Flu: Up to date (Last: 02/20/2024); Pneumococcal: PCV13 (2011, 2015) PPSV23 (/2019; received after age 67); Shingrix: No Record - DUE; Covid: Due (Last:07/07/2019); TDap: No Record - DUE  Cardiovascular Risk Reduction History of clinical ASCVD? no History of heart failure? no History of hyperlipidemia? yes Current BMI: 36.7 kg/m2 (Ht 60 in, Wt 86.6 kg) Taking statin? yes; moderate intensity (rosuvastatin  5 mg) Taking aspirin? not indicated; Not taking   Taking SGLT-2i? yes Taking GLP- 1 RA? no      _______________________________________________  Objective      Physical Examination:  Vitals:  Wt Readings from Last 3 Encounters:   03/19/24 86.6 kg (191 lb)  02/20/24 84.9 kg (187 lb 4 oz)  10/30/23 87.1 kg (192 lb)   BP Readings from Last 3 Encounters:  03/19/24 (!) 150/70  02/20/24 138/60  10/30/23 116/70   Pulse Readings from Last 3 Encounters:  03/19/24 61  02/20/24 (!) 53  10/30/23 62     Labs:?  Lab Results  Component Value Date   HGBA1C 8.4 (H) 02/16/2024   HGBA1C 8.1 (H) 10/27/2023   HGBA1C 7.8 (H) 06/26/2023   GLUCOSE 111 (H) 02/16/2024   MICRALBCREAT 23.8 10/27/2023   CREATININE 1.03 02/16/2024   CREATININE 0.96 10/27/2023   CREATININE 1.16 06/26/2023   GFR 51.76 (L) 02/16/2024   GFR 56.44 (L) 10/27/2023   GFR 45.08 (L) 06/26/2023    Lab Results  Component Value Date   CHOL 87 02/16/2024   LDLCALC 23 02/16/2024   LDLCALC 37 10/27/2023   LDLCALC 49 06/26/2023   LDLDIRECT 74.4 02/21/2012   HDL 42.00 02/16/2024   TRIG 110.0 02/16/2024   TRIG 169.0 (H) 10/27/2023   TRIG 97.0 06/26/2023   ALT 13 02/16/2024   ALT 11 10/27/2023   AST 15 02/16/2024   AST 12 10/27/2023      Chemistry      Component Value Date/Time   NA 138 02/16/2024 0801   K 4.9 02/16/2024 0801   CL 104 02/16/2024 0801   CO2 26 02/16/2024 0801   BUN 24 (H) 02/16/2024 0801   CREATININE 1.03 02/16/2024 0801   CREATININE 1.03 (H) 05/29/2022 1000      Component Value Date/Time   CALCIUM  8.9 02/16/2024 0801   ALKPHOS 66 02/16/2024 0801   AST 15 02/16/2024 0801   ALT 13 02/16/2024 0801   BILITOT 0.7 02/16/2024 0801     Assessment and Plan:   1. Diabetes, type 2: uncontrolled per last A1c of 8.4% (02/16/24), increased from previous, 8.1%. Goal 7-7.5% without hypoglycemia. Since last visit, *** Current Regimen: metformin  XR 1000mg  BID, Basaglar  24 u/d, [name]  15u TIDAC, [name]  25 mg/d Diet: Eating patterns dictated by BM (on creon )  Exercise: Limited mobility has been a barrier to physical activity.     HCM: Due for Shingrikx, Tdap *** Reviewed s/sx/tx hypoglycemia Future Consideration: DPP4i: Possible  consideration. Better-tolerated than GLP1 from GI perspective. Likely eligible for Patient Assistance. May support lower prandial insulin  requirement.  GLP1-RA: Avoid for now given previous intolerance Metformin : Continue to closely monitor renal function. If eGFR falls below 45 consistently, dose-reduce to 500 mg BID  SU: Defer at this time given risk of hypoglycemia on insulin . TZD: Avoiding due to possible weight gain/increase in fracture risk. DM >10 yrs   2. Healthcare Maintenance:  Pneumococcal - Current status: Up to date  Shingles - Current status: No record - DUE Influenza - Current status: Up to date for 2024 Tetanus - DUE Due to receive the following vaccines: Shingrix, Covid Booster, and TDap   Follow Up Follow up with clinical pharmacist via telepone in *** week, brief insulin  titration Patient given direct line for questions regarding medication  therapy  Future Appointments  Date Time Provider Department Center  05/04/2024  9:00 AM [facility] PHARMACIST [facility] [address]  05/10/2024  9:30 AM Glendia Shad, MD [facility] [address]  06/18/2024  8:30 AM Gaynel Delon CROME, DPM [facility]  06/21/2024  8:30 AM [facility] LAB [facility] [address]  06/23/2024  8:30 AM Glendia Shad, MD [facility] [address]  08/23/2024  1:40 PM [facility] ANNUAL WELLNESS VISIT [facility] [address] DOROTHA Bridgette, PharmD PGY1 Health-System Pharmacy Administration and Leadership Resident [facility]  Patient seen in conjunction by resident. Visit supervised.   Manuelita FABIENE Kobs, PharmD, BCACP, CPP Clinical Pharmacist Practitioner  [facility] Ph: [phone]

## 2024-05-04 ENCOUNTER — Other Ambulatory Visit: Payer: Self-pay | Admitting: Pharmacist

## 2024-05-04 DIAGNOSIS — N183 Chronic kidney disease, stage 3 unspecified: Secondary | ICD-10-CM

## 2024-05-04 NOTE — Telephone Encounter (Signed)
 Reviewed chart. Please confirm with Ms Shippee the current dose of basaglar  she is taking. Per Lindsay's last note - 26. Ok to refill, but please correct dosing after confirming with her.

## 2024-05-04 NOTE — Patient Instructions (Signed)
 Ms. SWATHI DAUPHIN,   It was a pleasure to speak with you today! As we discussed:?   Increase your evening meal [name]  dose to 17 units. (15 before breakfast, 17 units before your larger 2 meals of the day)  Continue all other diabetes medications as you have been taking them.  Please reach out prior to your next scheduled appointment should you have any questions or concerns.  You may reach me via MyChart Message, or you may leave me a voicemail at [phone] and I will get back to you shortly.   Thank you!   Future Appointments  Date Time Provider Department Center  05/10/2024  9:30 AM Glendia Shad, MD [facility] [address]  06/18/2024  8:30 AM Gaynel Delon CROME, DPM [facility]  06/21/2024  8:30 AM [facility] [address]  06/23/2024  8:30 AM Glendia Shad, MD [facility] [address]  08/23/2024  1:40 PM [facility] ANNUAL WELLNESS VISIT [facility] [address] Manuelita FABIENE Geronimo, PharmD, [place], CPP Clinical Pharmacist Practitioner [facility] Ph: [phone]

## 2024-05-10 ENCOUNTER — Ambulatory Visit: Payer: Self-pay | Admitting: Internal Medicine

## 2024-05-11 ENCOUNTER — Other Ambulatory Visit: Payer: Self-pay | Admitting: Pharmacist

## 2024-05-11 DIAGNOSIS — E1122 Type 2 diabetes mellitus with diabetic chronic kidney disease: Secondary | ICD-10-CM

## 2024-05-25 ENCOUNTER — Other Ambulatory Visit: Payer: Self-pay

## 2024-05-27 ENCOUNTER — Ambulatory Visit: Payer: Self-pay | Admitting: Internal Medicine

## 2024-06-18 ENCOUNTER — Ambulatory Visit: Admitting: Podiatry

## 2024-06-21 ENCOUNTER — Other Ambulatory Visit

## 2024-06-23 ENCOUNTER — Ambulatory Visit: Admitting: Internal Medicine

## 2024-08-23 ENCOUNTER — Ambulatory Visit
